# Patient Record
Sex: Female | Born: 1945
Health system: Southern US, Community
[De-identification: ages and names within clinical notes are randomized; demographics above are authoritative.]

## PROBLEM LIST (undated history)

## (undated) DIAGNOSIS — Z85528 Personal history of other malignant neoplasm of kidney: Secondary | ICD-10-CM

## (undated) DIAGNOSIS — K439 Ventral hernia without obstruction or gangrene: Secondary | ICD-10-CM

## (undated) DIAGNOSIS — I4891 Unspecified atrial fibrillation: Secondary | ICD-10-CM

## (undated) DIAGNOSIS — K7689 Other specified diseases of liver: Secondary | ICD-10-CM

## (undated) DIAGNOSIS — M19071 Primary osteoarthritis, right ankle and foot: Secondary | ICD-10-CM

## (undated) DIAGNOSIS — E669 Obesity, unspecified: Secondary | ICD-10-CM

## (undated) DIAGNOSIS — D649 Anemia, unspecified: Secondary | ICD-10-CM

## (undated) DIAGNOSIS — M1712 Unilateral primary osteoarthritis, left knee: Secondary | ICD-10-CM

## (undated) DIAGNOSIS — M1990 Unspecified osteoarthritis, unspecified site: Secondary | ICD-10-CM

## (undated) DIAGNOSIS — B259 Cytomegaloviral disease, unspecified: Secondary | ICD-10-CM

## (undated) DIAGNOSIS — I471 Supraventricular tachycardia, unspecified: Secondary | ICD-10-CM

## (undated) DIAGNOSIS — D631 Anemia in chronic kidney disease: Secondary | ICD-10-CM

## (undated) DIAGNOSIS — K449 Diaphragmatic hernia without obstruction or gangrene: Secondary | ICD-10-CM

## (undated) DIAGNOSIS — H269 Unspecified cataract: Secondary | ICD-10-CM

## (undated) DIAGNOSIS — E538 Deficiency of other specified B group vitamins: Secondary | ICD-10-CM

## (undated) DIAGNOSIS — N390 Urinary tract infection, site not specified: Secondary | ICD-10-CM

## (undated) DIAGNOSIS — Z905 Acquired absence of kidney: Secondary | ICD-10-CM

## (undated) DIAGNOSIS — K4090 Unilateral inguinal hernia, without obstruction or gangrene, not specified as recurrent: Secondary | ICD-10-CM

## (undated) DIAGNOSIS — M84479A Pathological fracture, unspecified toe(s), initial encounter for fracture: Secondary | ICD-10-CM

## (undated) DIAGNOSIS — F32A Depression, unspecified: Secondary | ICD-10-CM

## (undated) DIAGNOSIS — R011 Cardiac murmur, unspecified: Secondary | ICD-10-CM

## (undated) DIAGNOSIS — N186 End stage renal disease: Secondary | ICD-10-CM

## (undated) DIAGNOSIS — I480 Paroxysmal atrial fibrillation: Secondary | ICD-10-CM

## (undated) DIAGNOSIS — M7542 Impingement syndrome of left shoulder: Secondary | ICD-10-CM

## (undated) DIAGNOSIS — C649 Malignant neoplasm of unspecified kidney, except renal pelvis: Secondary | ICD-10-CM

## (undated) DIAGNOSIS — N051 Unspecified nephritic syndrome with focal and segmental glomerular lesions: Secondary | ICD-10-CM

## (undated) DIAGNOSIS — J189 Pneumonia, unspecified organism: Secondary | ICD-10-CM

## (undated) DIAGNOSIS — K5790 Diverticulosis of intestine, part unspecified, without perforation or abscess without bleeding: Secondary | ICD-10-CM

## (undated) DIAGNOSIS — M810 Age-related osteoporosis without current pathological fracture: Secondary | ICD-10-CM

## (undated) DIAGNOSIS — N189 Chronic kidney disease, unspecified: Secondary | ICD-10-CM

## (undated) DIAGNOSIS — F419 Anxiety disorder, unspecified: Secondary | ICD-10-CM

## (undated) DIAGNOSIS — I10 Essential (primary) hypertension: Secondary | ICD-10-CM

## (undated) DIAGNOSIS — M48061 Spinal stenosis, lumbar region without neurogenic claudication: Secondary | ICD-10-CM

## (undated) DIAGNOSIS — E785 Hyperlipidemia, unspecified: Secondary | ICD-10-CM

## (undated) DIAGNOSIS — Z940 Kidney transplant status: Secondary | ICD-10-CM

## (undated) DIAGNOSIS — N183 Chronic kidney disease, stage 3 (moderate): Secondary | ICD-10-CM

## (undated) DIAGNOSIS — L438 Other lichen planus: Secondary | ICD-10-CM

## (undated) DIAGNOSIS — K219 Gastro-esophageal reflux disease without esophagitis: Secondary | ICD-10-CM

## (undated) DIAGNOSIS — E039 Hypothyroidism, unspecified: Secondary | ICD-10-CM

## (undated) DIAGNOSIS — M109 Gout, unspecified: Secondary | ICD-10-CM

## (undated) HISTORY — DX: Diaphragmatic hernia without obstruction or gangrene: K44.9

## (undated) HISTORY — PX: COLONOSCOPY: SHX174

## (undated) HISTORY — DX: Supraventricular tachycardia: I47.1

## (undated) HISTORY — DX: Paroxysmal atrial fibrillation: I48.0

## (undated) HISTORY — DX: Other specified diseases of liver: K76.89

## (undated) HISTORY — DX: Pneumonia, unspecified organism: J18.9

## (undated) HISTORY — DX: Hyperlipidemia, unspecified: E78.5

## (undated) HISTORY — DX: Cytomegaloviral disease, unspecified: B25.9

## (undated) HISTORY — PX: CARDIOVASCULAR STRESS TEST: SHX262

## (undated) HISTORY — DX: Impingement syndrome of left shoulder: M75.42

## (undated) HISTORY — DX: Chronic kidney disease, stage 3 (moderate): N18.3

## (undated) HISTORY — DX: Pathological fracture, unspecified toe(s), initial encounter for fracture: M84.479A

## (undated) HISTORY — DX: Obesity, unspecified: E66.9

## (undated) HISTORY — DX: Deficiency of other specified B group vitamins: E53.8

## (undated) HISTORY — PX: TRANSTHORACIC ECHOCARDIOGRAM: SHX275

## (undated) HISTORY — DX: Malignant neoplasm of unspecified kidney, except renal pelvis: C64.9

## (undated) HISTORY — DX: Unspecified osteoarthritis, unspecified site: M19.90

## (undated) HISTORY — DX: Primary osteoarthritis, right ankle and foot: M19.071

## (undated) HISTORY — DX: Unilateral primary osteoarthritis, left knee: M17.12

## (undated) HISTORY — DX: Anemia in chronic kidney disease: D63.1

## (undated) HISTORY — DX: Urinary tract infection, site not specified: N39.0

## (undated) HISTORY — DX: Anemia, unspecified: D64.9

## (undated) HISTORY — DX: Acquired absence of kidney: Z90.5

## (undated) HISTORY — PX: OTHER SURGICAL HISTORY: SHX169

## (undated) HISTORY — DX: Cardiac murmur, unspecified: R01.1

## (undated) HISTORY — DX: Anxiety disorder, unspecified: F41.9

## (undated) HISTORY — DX: Ventral hernia without obstruction or gangrene: K43.9

## (undated) HISTORY — DX: Depression, unspecified: F32.A

## (undated) HISTORY — DX: Gout, unspecified: M10.9

## (undated) HISTORY — DX: Unspecified cataract: H26.9

## (undated) HISTORY — DX: End stage renal disease: N18.6

## (undated) HISTORY — DX: Unilateral inguinal hernia, without obstruction or gangrene, not specified as recurrent: K40.90

## (undated) HISTORY — DX: Unspecified atrial fibrillation: I48.91

## (undated) HISTORY — DX: Other lichen planus: L43.8

## (undated) HISTORY — DX: Age-related osteoporosis without current pathological fracture: M81.0

## (undated) HISTORY — DX: Supraventricular tachycardia, unspecified: I47.10

## (undated) HISTORY — PX: RENAL BIOPSY: SHX156

## (undated) HISTORY — PX: CATARACT EXTRACTION: SUR2

## (undated) HISTORY — PX: AV FISTULA PLACEMENT: SHX1204

## (undated) HISTORY — DX: Kidney transplant status: Z94.0

## (undated) HISTORY — DX: Personal history of other malignant neoplasm of kidney: Z85.528

## (undated) HISTORY — DX: Gastro-esophageal reflux disease without esophagitis: K21.9

## (undated) HISTORY — DX: Chronic kidney disease, unspecified: N18.9

## (undated) HISTORY — DX: Spinal stenosis, lumbar region without neurogenic claudication: M48.061

## (undated) HISTORY — DX: Hypothyroidism, unspecified: E03.9

## (undated) HISTORY — PX: HYSTERECTOMY ABDOMINAL WITH SALPINGECTOMY: SHX6725

---

## 1991-08-09 DIAGNOSIS — Z85528 Personal history of other malignant neoplasm of kidney: Secondary | ICD-10-CM

## 1991-08-09 HISTORY — DX: Personal history of other malignant neoplasm of kidney: Z85.528

## 1991-08-09 HISTORY — PX: NEPHRECTOMY: SHX65

## 1995-08-09 HISTORY — PX: TOTAL ABDOMINAL HYSTERECTOMY W/ BILATERAL SALPINGOOPHORECTOMY: SHX83

## 1998-04-30 ENCOUNTER — Ambulatory Visit (HOSPITAL_COMMUNITY): Admission: RE | Admit: 1998-04-30 | Discharge: 1998-04-30 | Payer: Self-pay | Admitting: Internal Medicine

## 1998-06-17 ENCOUNTER — Encounter: Payer: Self-pay | Admitting: *Deleted

## 1998-06-17 ENCOUNTER — Ambulatory Visit (HOSPITAL_COMMUNITY): Admission: RE | Admit: 1998-06-17 | Discharge: 1998-06-17 | Payer: Self-pay | Admitting: *Deleted

## 1999-10-13 ENCOUNTER — Inpatient Hospital Stay (HOSPITAL_COMMUNITY): Admission: RE | Admit: 1999-10-13 | Discharge: 1999-10-16 | Payer: Self-pay | Admitting: Urology

## 1999-10-13 ENCOUNTER — Encounter (INDEPENDENT_AMBULATORY_CARE_PROVIDER_SITE_OTHER): Payer: Self-pay | Admitting: Specialist

## 1999-11-05 ENCOUNTER — Other Ambulatory Visit: Admission: RE | Admit: 1999-11-05 | Discharge: 1999-11-05 | Payer: Self-pay | Admitting: *Deleted

## 2000-01-30 ENCOUNTER — Encounter: Payer: Self-pay | Admitting: Emergency Medicine

## 2000-01-30 ENCOUNTER — Emergency Department (HOSPITAL_COMMUNITY): Admission: EM | Admit: 2000-01-30 | Discharge: 2000-01-30 | Payer: Self-pay | Admitting: Emergency Medicine

## 2000-05-01 ENCOUNTER — Encounter: Payer: Self-pay | Admitting: *Deleted

## 2000-05-01 ENCOUNTER — Ambulatory Visit (HOSPITAL_COMMUNITY): Admission: RE | Admit: 2000-05-01 | Discharge: 2000-05-01 | Payer: Self-pay | Admitting: *Deleted

## 2000-05-04 ENCOUNTER — Encounter: Admission: RE | Admit: 2000-05-04 | Discharge: 2000-05-04 | Payer: Self-pay | Admitting: *Deleted

## 2000-05-04 ENCOUNTER — Encounter: Payer: Self-pay | Admitting: *Deleted

## 2000-11-23 ENCOUNTER — Encounter: Payer: Self-pay | Admitting: Urology

## 2000-11-23 ENCOUNTER — Encounter: Admission: RE | Admit: 2000-11-23 | Discharge: 2000-11-23 | Payer: Self-pay | Admitting: Urology

## 2001-05-09 ENCOUNTER — Encounter: Payer: Self-pay | Admitting: *Deleted

## 2001-05-09 ENCOUNTER — Ambulatory Visit (HOSPITAL_COMMUNITY): Admission: RE | Admit: 2001-05-09 | Discharge: 2001-05-09 | Payer: Self-pay | Admitting: *Deleted

## 2001-05-15 ENCOUNTER — Encounter: Admission: RE | Admit: 2001-05-15 | Discharge: 2001-05-15 | Payer: Self-pay | Admitting: *Deleted

## 2001-05-15 ENCOUNTER — Encounter: Payer: Self-pay | Admitting: *Deleted

## 2001-06-19 ENCOUNTER — Other Ambulatory Visit: Admission: RE | Admit: 2001-06-19 | Discharge: 2001-06-19 | Payer: Self-pay | Admitting: *Deleted

## 2001-11-15 ENCOUNTER — Encounter: Admission: RE | Admit: 2001-11-15 | Discharge: 2001-11-15 | Payer: Self-pay | Admitting: Urology

## 2001-11-15 ENCOUNTER — Encounter: Payer: Self-pay | Admitting: Urology

## 2002-05-16 ENCOUNTER — Encounter: Admission: RE | Admit: 2002-05-16 | Discharge: 2002-05-16 | Payer: Self-pay | Admitting: *Deleted

## 2002-05-16 ENCOUNTER — Encounter: Payer: Self-pay | Admitting: *Deleted

## 2002-11-21 ENCOUNTER — Ambulatory Visit (HOSPITAL_COMMUNITY): Admission: RE | Admit: 2002-11-21 | Discharge: 2002-11-21 | Payer: Self-pay | Admitting: Urology

## 2002-11-21 ENCOUNTER — Encounter: Payer: Self-pay | Admitting: Urology

## 2003-05-29 ENCOUNTER — Encounter: Payer: Self-pay | Admitting: *Deleted

## 2003-05-29 ENCOUNTER — Encounter: Admission: RE | Admit: 2003-05-29 | Discharge: 2003-05-29 | Payer: Self-pay | Admitting: *Deleted

## 2003-12-09 ENCOUNTER — Ambulatory Visit (HOSPITAL_COMMUNITY): Admission: RE | Admit: 2003-12-09 | Discharge: 2003-12-09 | Payer: Self-pay | Admitting: Urology

## 2003-12-09 IMAGING — CR DG CHEST 2V
2 series · 2 of 2 positions shown · non-contrast
Comparison: [DATE].

CLINICAL DATA: History of left renal cancer.  
 TWO VIEW CHEST

[view not recorded (1 of 2)]
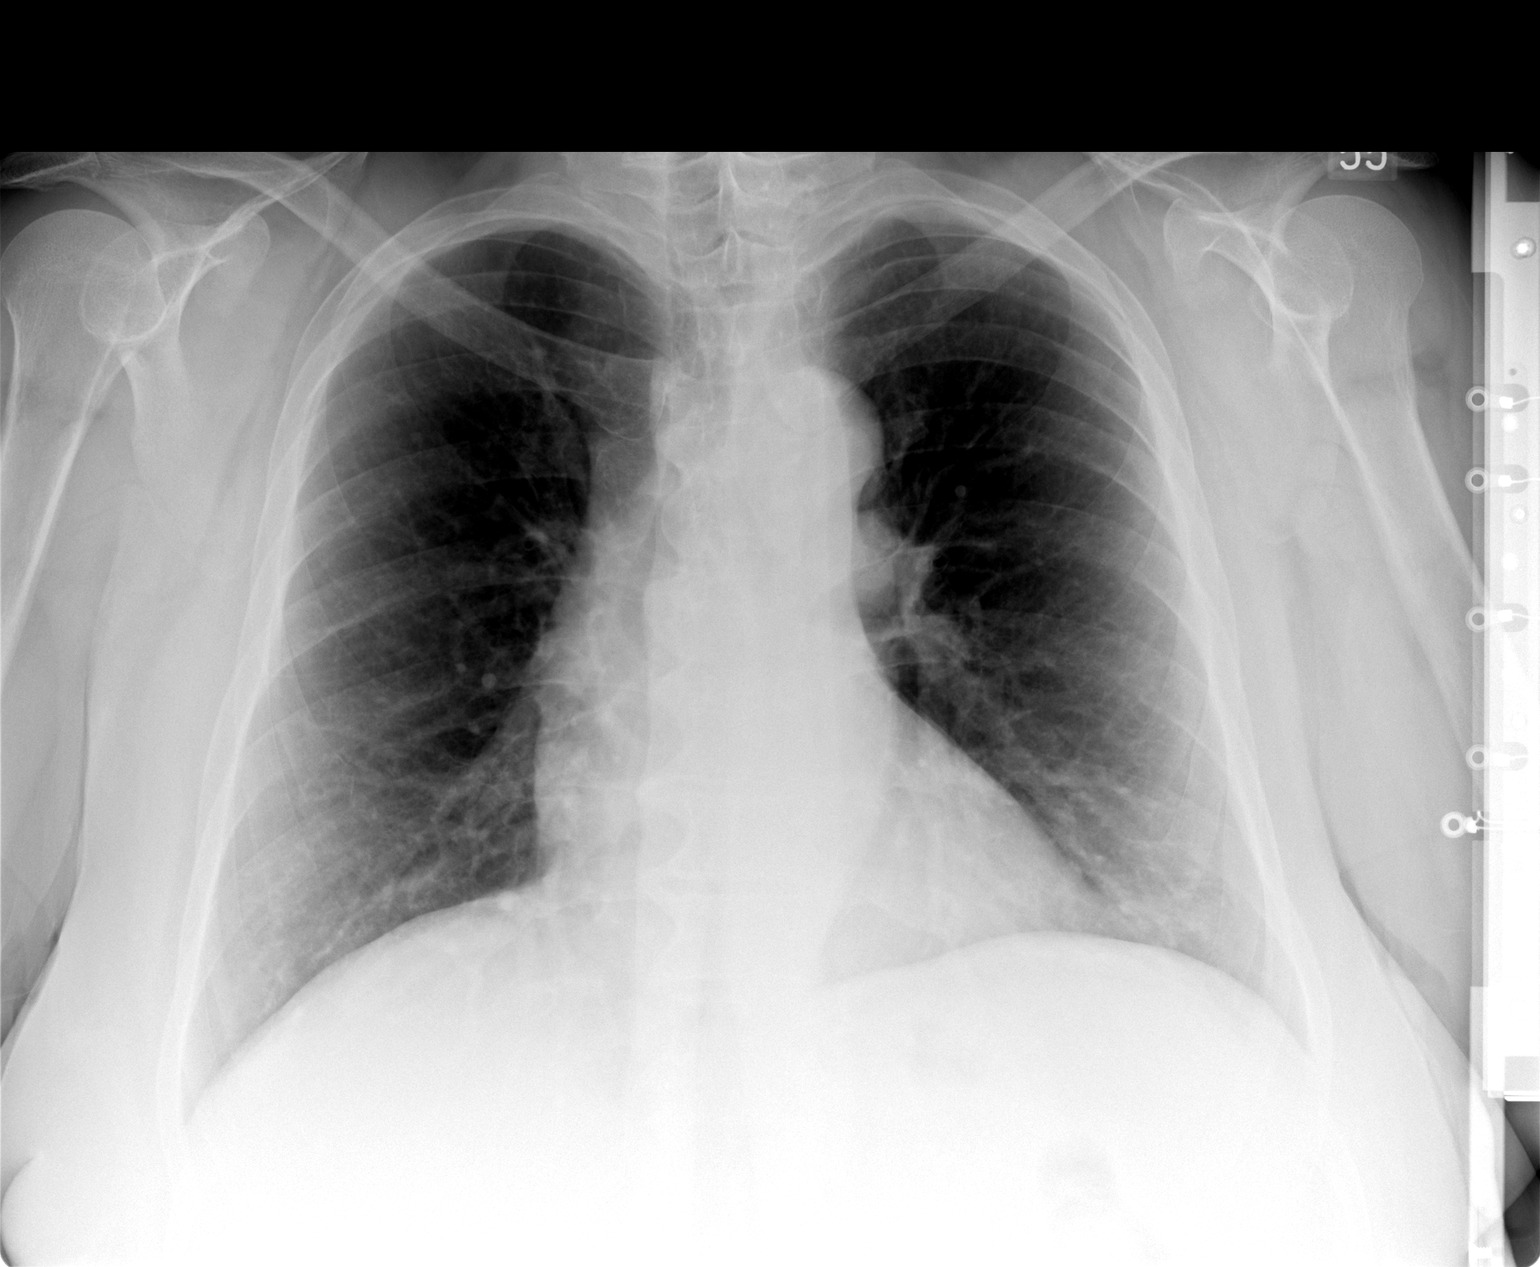

[view not recorded (2 of 2)]
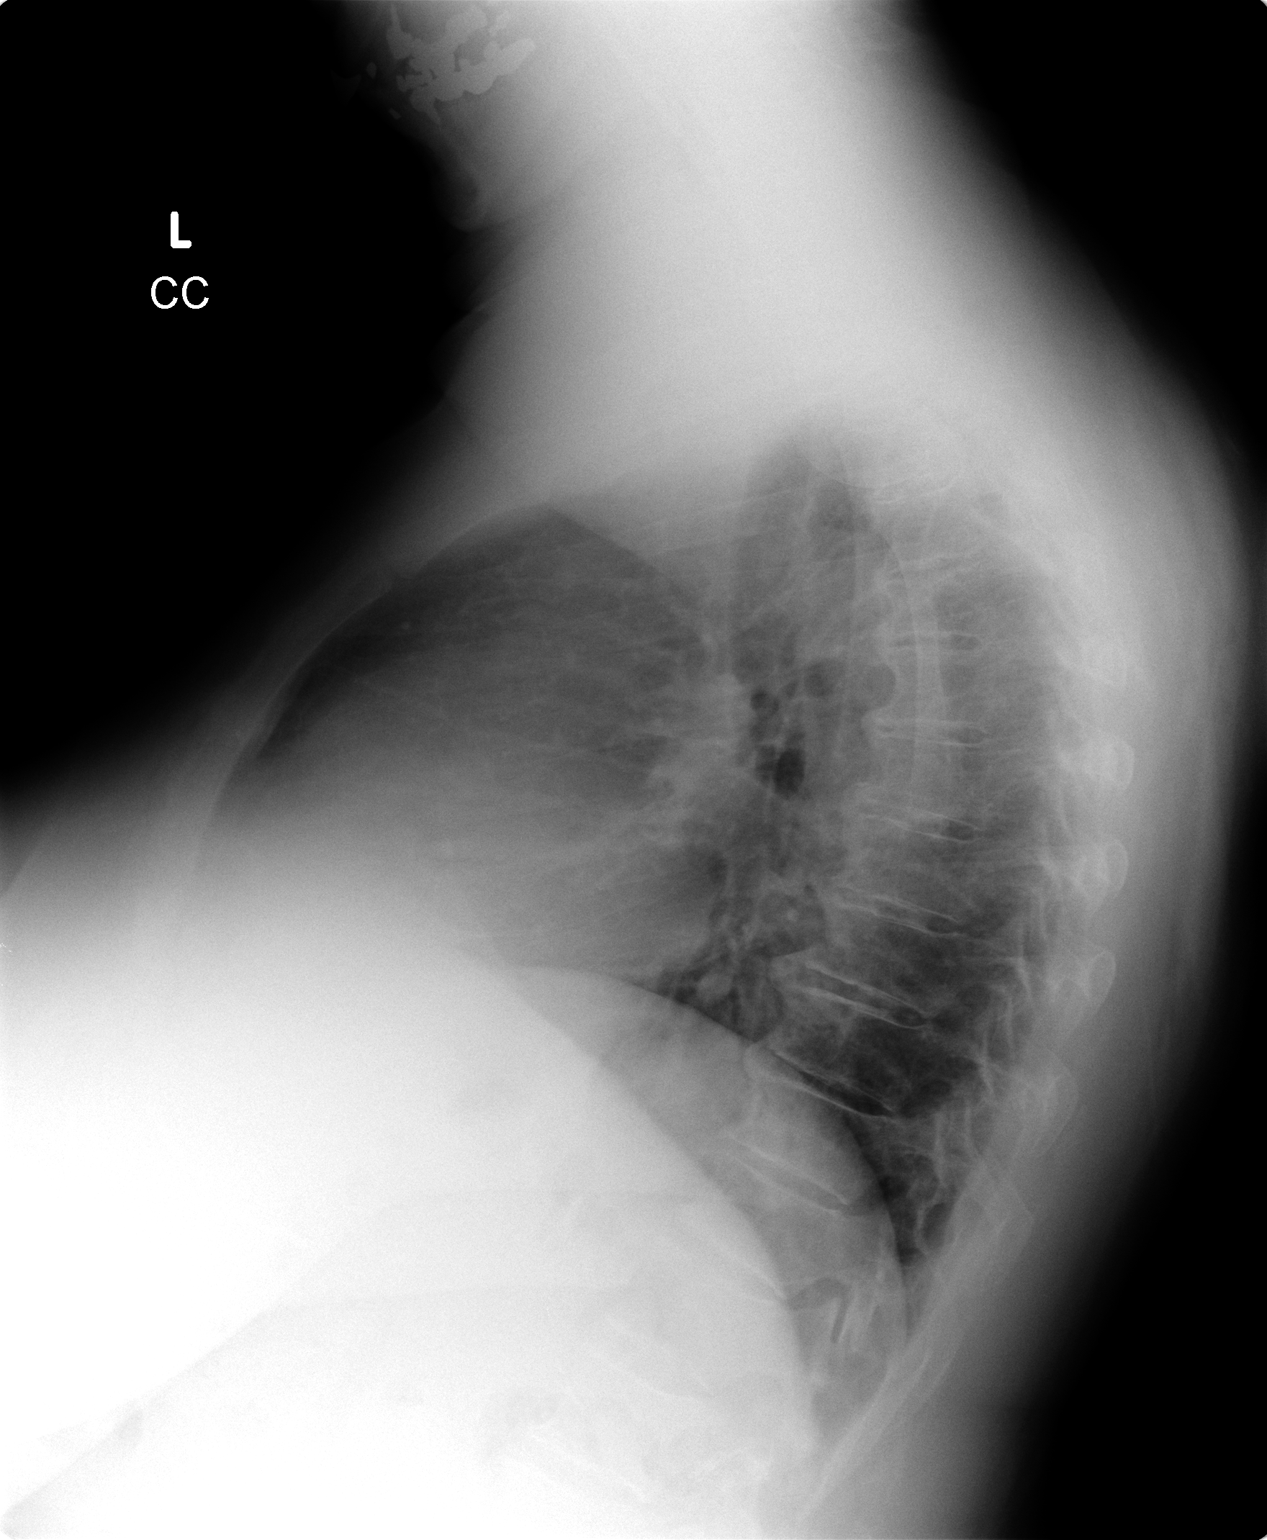

[2 of 2 positions shown; findings below may reference images not displayed]

Stable normal sized heart and mild diffuse peribronchial thickening.  Thoracic spine degenerative changes.
 IMPRESSION
 Stable mild chronic bronchitic changes.  No evidence of metastatic disease.

## 2003-12-19 ENCOUNTER — Encounter: Payer: Self-pay | Admitting: Internal Medicine

## 2004-06-23 ENCOUNTER — Encounter: Admission: RE | Admit: 2004-06-23 | Discharge: 2004-06-23 | Payer: Self-pay | Admitting: *Deleted

## 2004-08-12 ENCOUNTER — Ambulatory Visit: Payer: Self-pay | Admitting: Internal Medicine

## 2004-08-19 ENCOUNTER — Ambulatory Visit: Payer: Self-pay | Admitting: Internal Medicine

## 2004-09-10 ENCOUNTER — Ambulatory Visit: Payer: Self-pay | Admitting: Family Medicine

## 2005-03-25 ENCOUNTER — Ambulatory Visit: Payer: Self-pay | Admitting: Internal Medicine

## 2005-04-19 ENCOUNTER — Ambulatory Visit: Payer: Self-pay | Admitting: Internal Medicine

## 2005-08-15 ENCOUNTER — Ambulatory Visit: Payer: Self-pay | Admitting: Internal Medicine

## 2005-09-02 ENCOUNTER — Encounter: Admission: RE | Admit: 2005-09-02 | Discharge: 2005-09-02 | Payer: Self-pay | Admitting: Internal Medicine

## 2005-09-02 IMAGING — MG MM MAMMO SCREENING
9 series · 9 of 9 positions shown · non-contrast
Comparison: none

SCREENING MAMMOGRAM:
There is a fibroglandular pattern.  No masses or malignant type calcifications are identified.  
Compared with prior studies.

[R CC (1 of 2)]
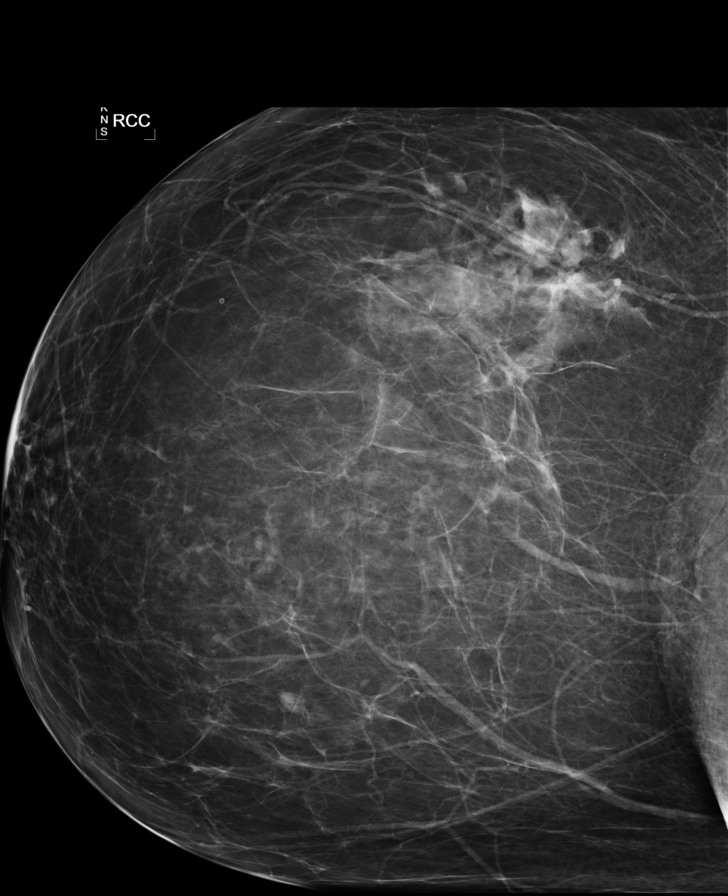

[R MLO (1 of 2)]
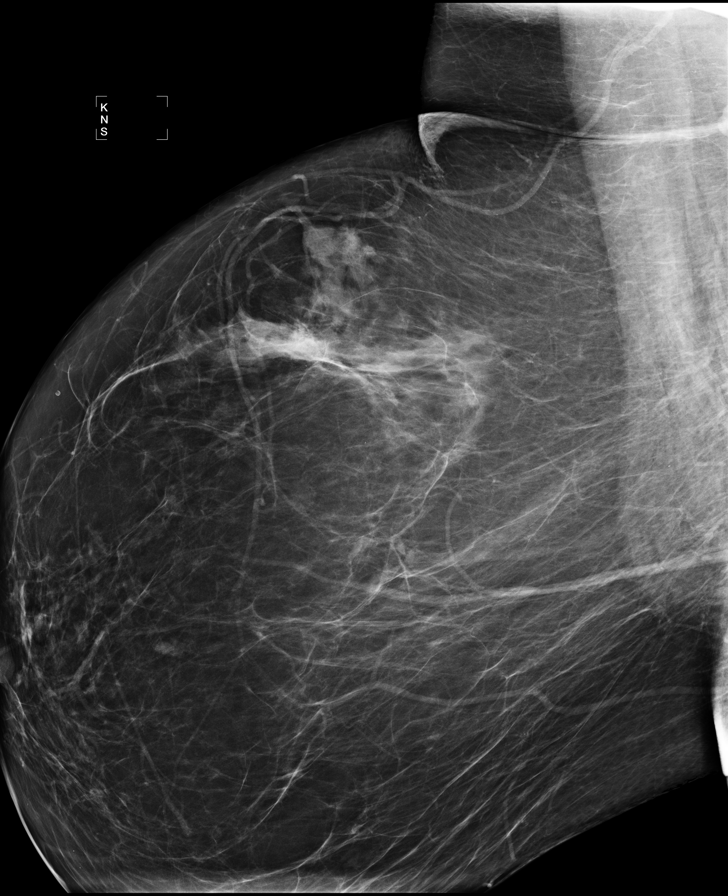

[L CC (1 of 2)]
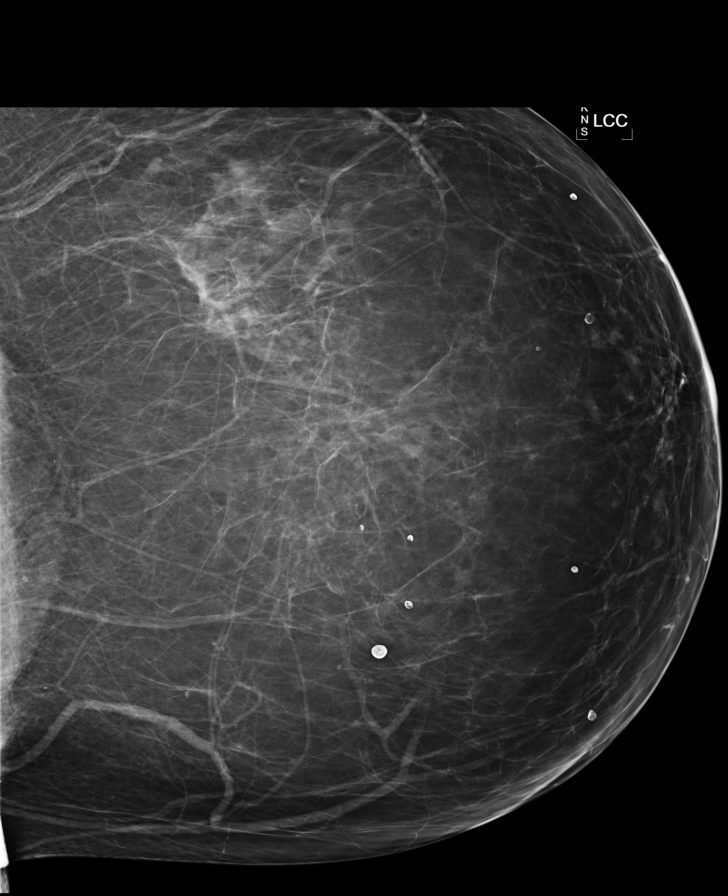

[L MLO (1 of 2)]
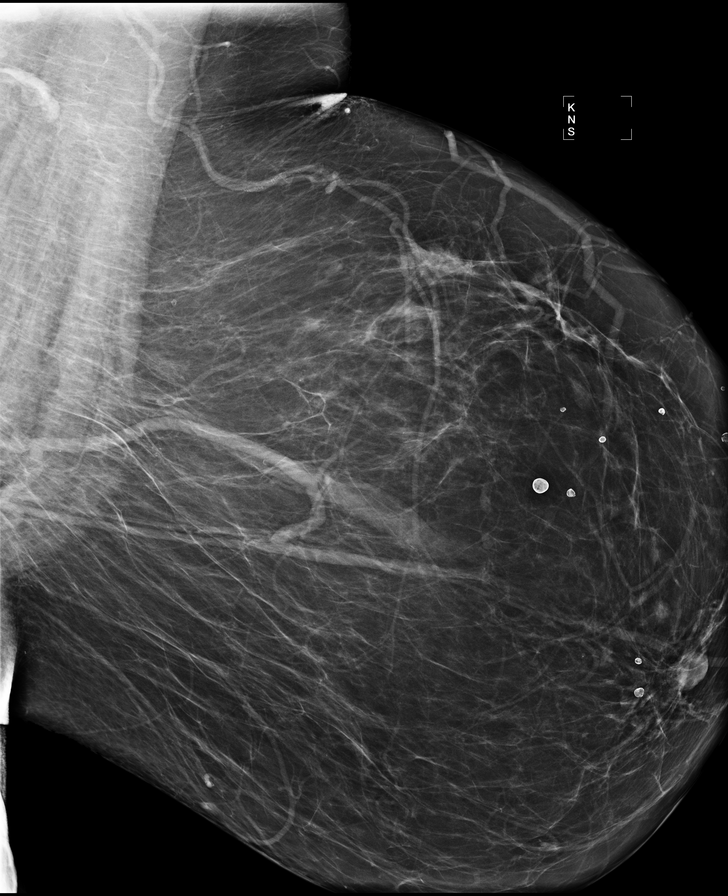

[L CC (2 of 2)]
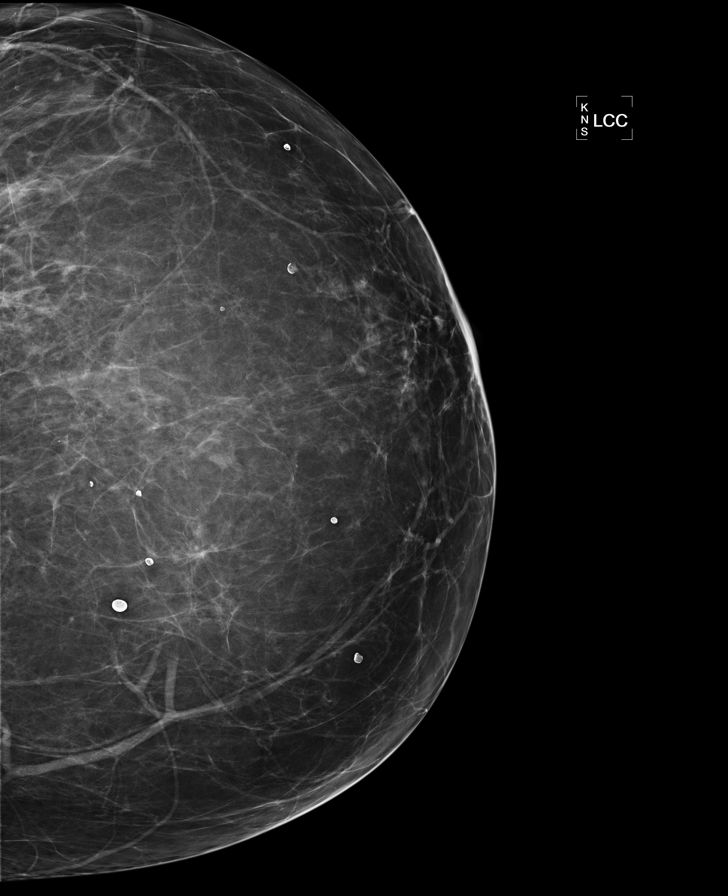

[R CC (2 of 2)]
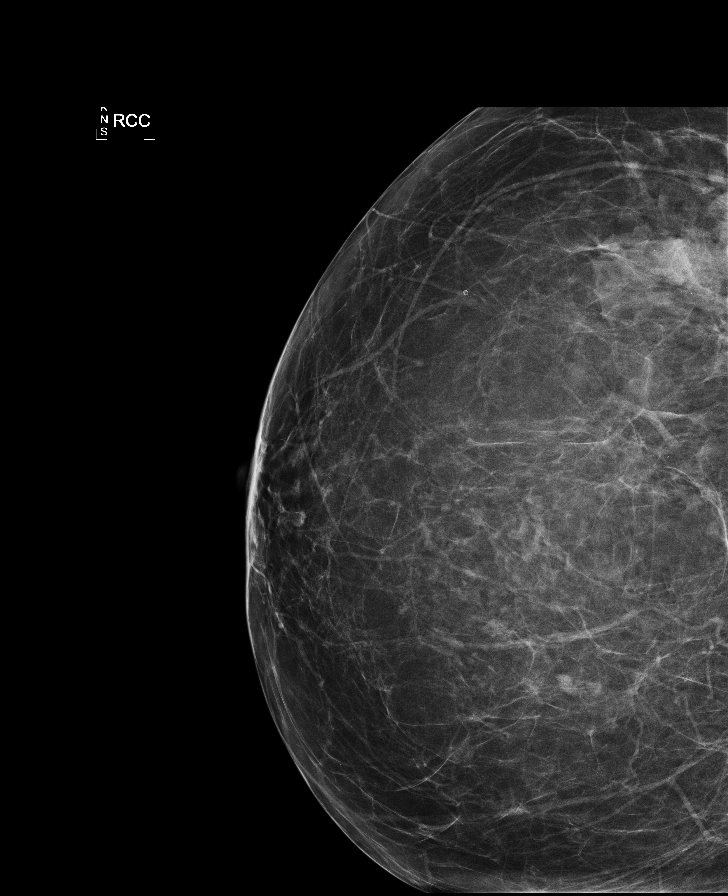

[R CV]
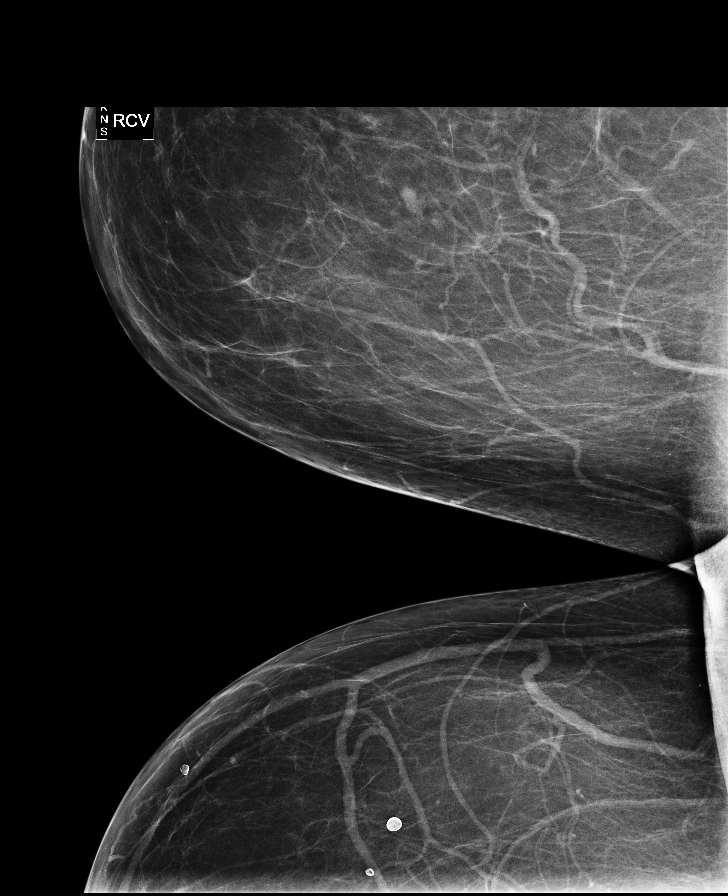

[R MLO (2 of 2)]
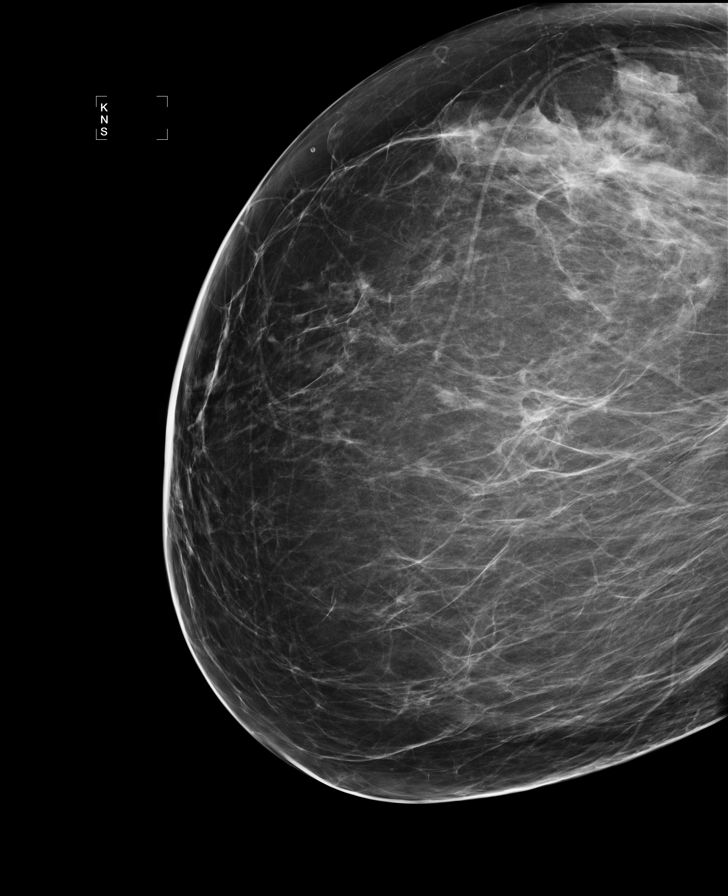

[L MLO (2 of 2)]
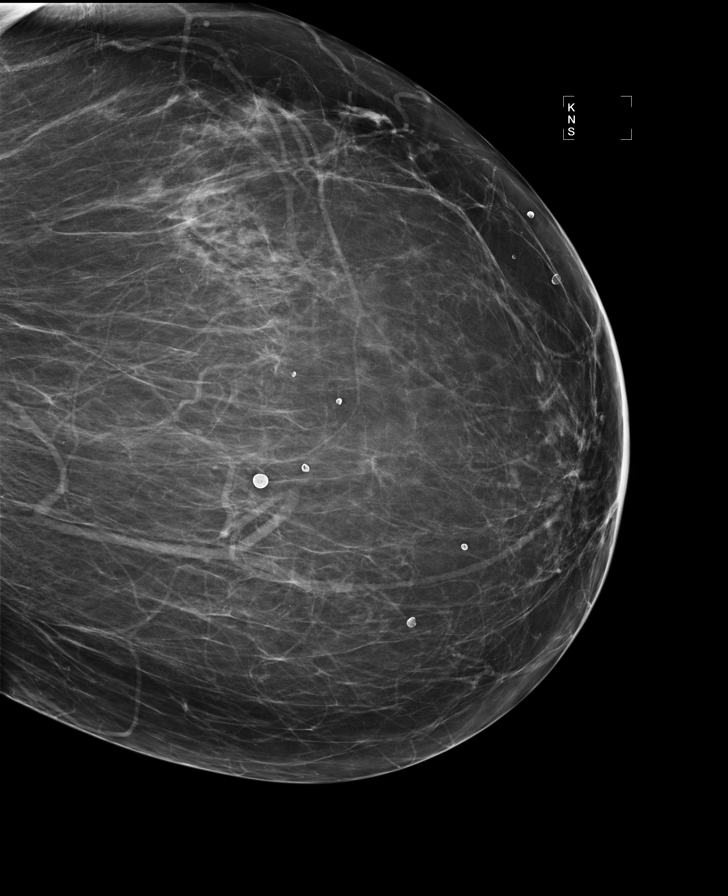

[9 of 9 positions shown; findings below may reference images not displayed]

IMPRESSION: No specific mammographic evidence of malignancy.  Next screening mammogram is recommended in one 
year.

ASSESSMENT: Negative - BI-RADS 1

Screening mammogram in 1 year.

## 2005-09-07 ENCOUNTER — Ambulatory Visit: Payer: Self-pay | Admitting: Internal Medicine

## 2005-09-19 ENCOUNTER — Ambulatory Visit: Payer: Self-pay | Admitting: Internal Medicine

## 2005-10-04 ENCOUNTER — Ambulatory Visit: Payer: Self-pay | Admitting: Internal Medicine

## 2005-12-12 ENCOUNTER — Ambulatory Visit: Payer: Self-pay | Admitting: Internal Medicine

## 2005-12-22 ENCOUNTER — Ambulatory Visit (HOSPITAL_COMMUNITY): Admission: RE | Admit: 2005-12-22 | Discharge: 2005-12-22 | Payer: Self-pay | Admitting: Urology

## 2005-12-28 IMAGING — CR DG CHEST 2V
2 series · 2 of 2 positions shown · non-contrast
Comparison: [DATE].

CLINICAL DATA: History of kidney cancer.  
 CHEST - 2 VIEW:

[view not recorded (1 of 2)]
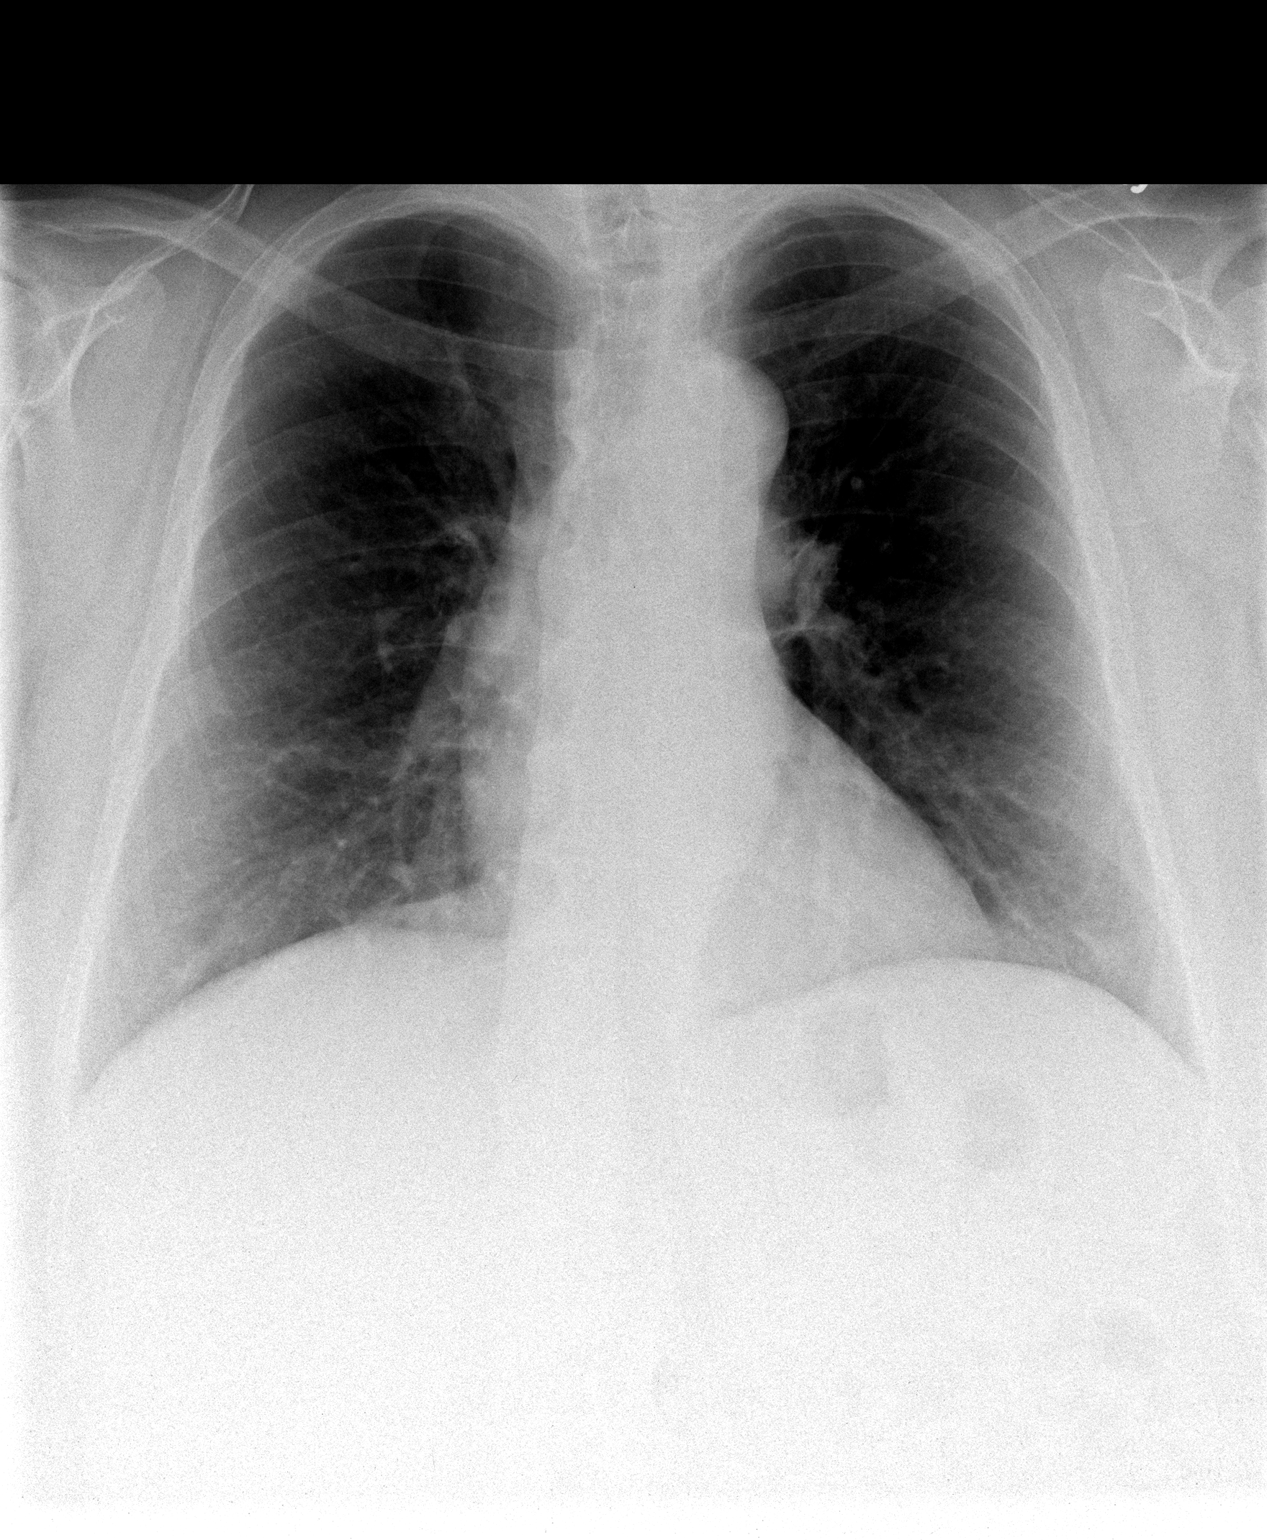

[view not recorded (2 of 2)]
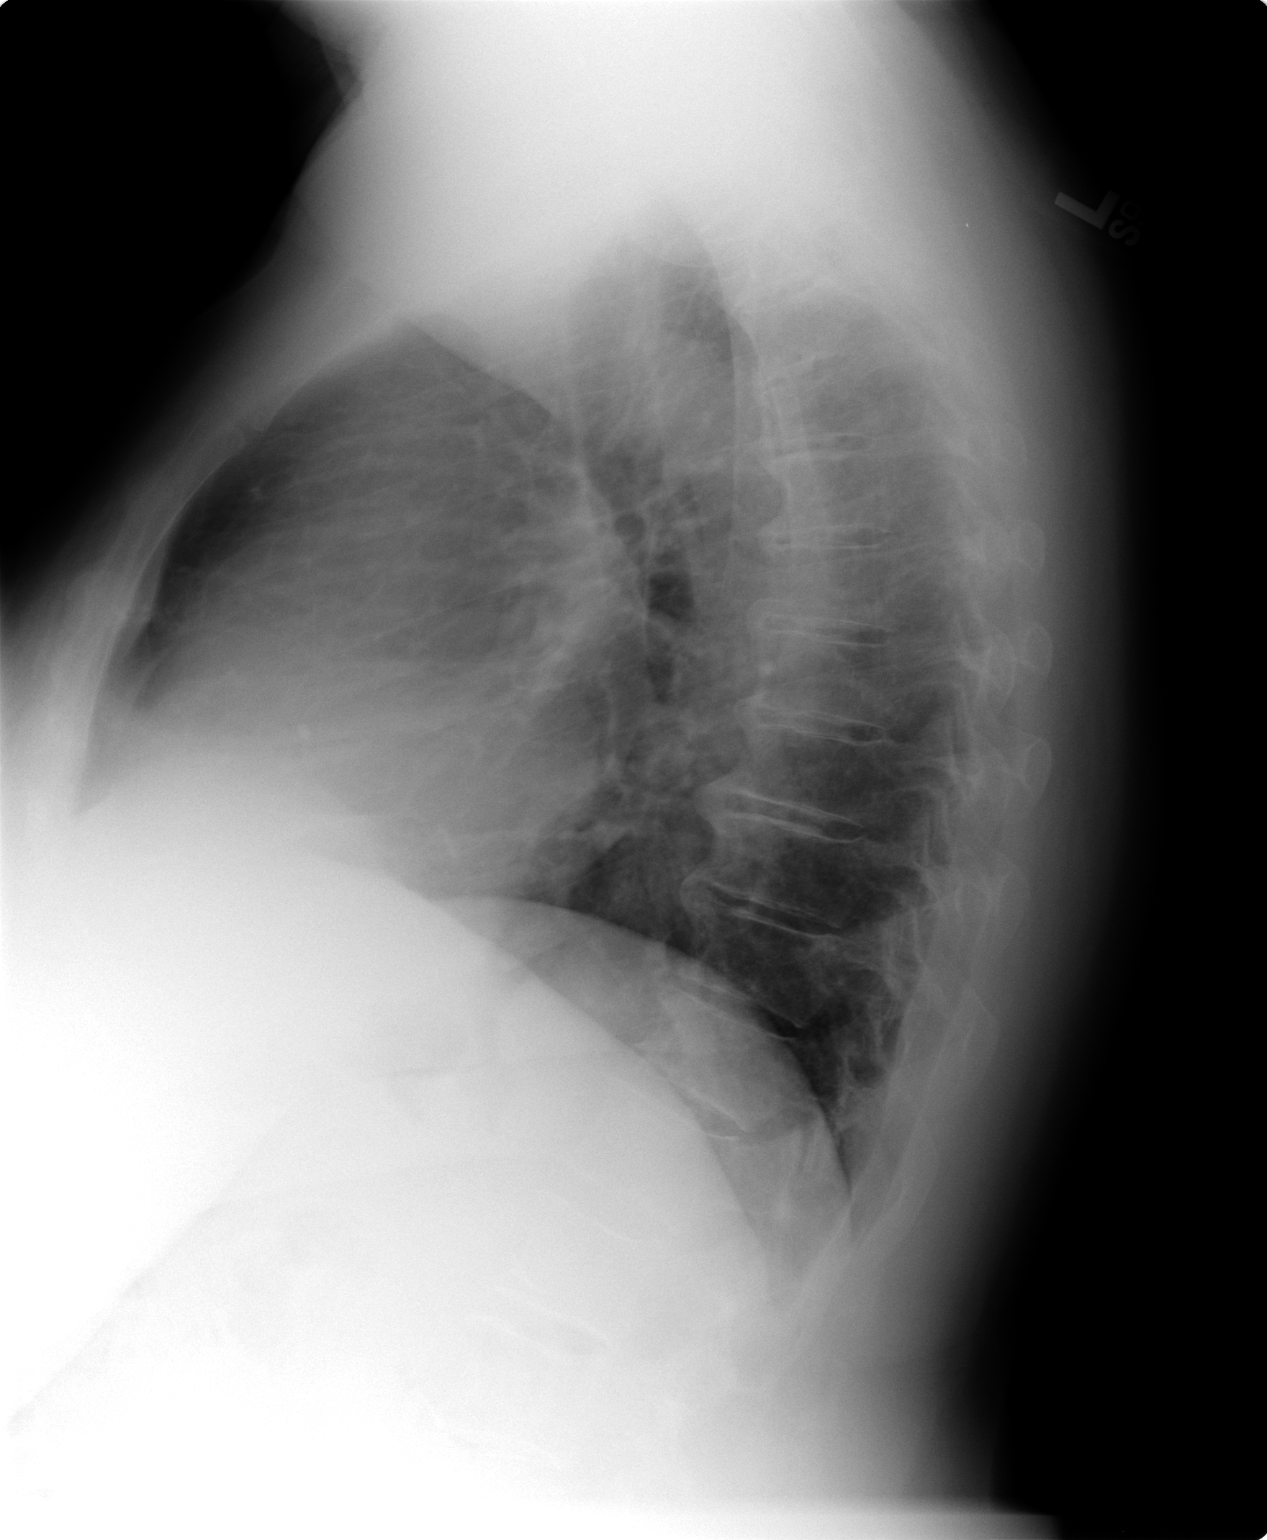

[2 of 2 positions shown; findings below may reference images not displayed]

The cardiac silhouette, mediastinum, and pulmonary vasculature are within normal limits.  Both lungs are clear.  There are hypertrophic degenerative changes within the axial spine, unchanged.
IMPRESSION: Stable chest x-ray with no evidence of acute cardiac or pulmonary process.

## 2006-12-26 DIAGNOSIS — M1990 Unspecified osteoarthritis, unspecified site: Secondary | ICD-10-CM | POA: Insufficient documentation

## 2007-01-16 ENCOUNTER — Encounter: Admission: RE | Admit: 2007-01-16 | Discharge: 2007-01-16 | Payer: Self-pay | Admitting: Internal Medicine

## 2007-02-23 ENCOUNTER — Encounter: Payer: Self-pay | Admitting: Internal Medicine

## 2007-03-26 ENCOUNTER — Ambulatory Visit: Payer: Self-pay | Admitting: Internal Medicine

## 2007-03-26 DIAGNOSIS — Z85528 Personal history of other malignant neoplasm of kidney: Secondary | ICD-10-CM | POA: Insufficient documentation

## 2007-03-26 DIAGNOSIS — M109 Gout, unspecified: Secondary | ICD-10-CM | POA: Insufficient documentation

## 2007-03-26 DIAGNOSIS — E8881 Metabolic syndrome: Secondary | ICD-10-CM | POA: Insufficient documentation

## 2007-04-02 ENCOUNTER — Encounter (INDEPENDENT_AMBULATORY_CARE_PROVIDER_SITE_OTHER): Payer: Self-pay | Admitting: *Deleted

## 2007-09-27 ENCOUNTER — Encounter: Payer: Self-pay | Admitting: Internal Medicine

## 2007-10-02 ENCOUNTER — Ambulatory Visit: Payer: Self-pay | Admitting: Internal Medicine

## 2007-10-02 DIAGNOSIS — H8110 Benign paroxysmal vertigo, unspecified ear: Secondary | ICD-10-CM | POA: Insufficient documentation

## 2007-10-02 DIAGNOSIS — E785 Hyperlipidemia, unspecified: Secondary | ICD-10-CM | POA: Insufficient documentation

## 2007-10-02 DIAGNOSIS — E039 Hypothyroidism, unspecified: Secondary | ICD-10-CM | POA: Insufficient documentation

## 2007-10-02 DIAGNOSIS — I10 Essential (primary) hypertension: Secondary | ICD-10-CM | POA: Insufficient documentation

## 2007-10-09 ENCOUNTER — Telehealth: Payer: Self-pay | Admitting: Internal Medicine

## 2007-10-09 ENCOUNTER — Encounter (INDEPENDENT_AMBULATORY_CARE_PROVIDER_SITE_OTHER): Payer: Self-pay | Admitting: *Deleted

## 2007-10-09 LAB — CONVERTED CEMR LAB
ALT: 20 units/L (ref 0–35)
AST: 24 units/L (ref 0–37)
Cholesterol: 236 mg/dL (ref 0–200)
Direct LDL: 166.3 mg/dL
HDL: 44.2 mg/dL (ref >39.0)
Hgb A1c MFr Bld: 5.5 % (ref 4.6–6.0)
TSH: 8.65 microintl units/mL — ABNORMAL HIGH (ref 0.35–5.50)
Total CHOL/HDL Ratio: 5.3
Triglycerides: 127 mg/dL (ref 0–149)
VLDL: 25 mg/dL (ref 0–40)

## 2007-10-16 ENCOUNTER — Telehealth (INDEPENDENT_AMBULATORY_CARE_PROVIDER_SITE_OTHER): Payer: Self-pay | Admitting: *Deleted

## 2008-03-05 ENCOUNTER — Telehealth (INDEPENDENT_AMBULATORY_CARE_PROVIDER_SITE_OTHER): Payer: Self-pay | Admitting: *Deleted

## 2008-03-31 ENCOUNTER — Encounter: Payer: Self-pay | Admitting: Internal Medicine

## 2008-06-09 ENCOUNTER — Telehealth (INDEPENDENT_AMBULATORY_CARE_PROVIDER_SITE_OTHER): Payer: Self-pay | Admitting: *Deleted

## 2008-06-11 ENCOUNTER — Ambulatory Visit: Payer: Self-pay | Admitting: Family Medicine

## 2008-06-16 ENCOUNTER — Encounter: Admission: RE | Admit: 2008-06-16 | Discharge: 2008-06-16 | Payer: Self-pay | Admitting: Internal Medicine

## 2008-08-26 ENCOUNTER — Ambulatory Visit: Payer: Self-pay | Admitting: Occupational Medicine

## 2008-08-26 ENCOUNTER — Telehealth (INDEPENDENT_AMBULATORY_CARE_PROVIDER_SITE_OTHER): Payer: Self-pay | Admitting: *Deleted

## 2008-09-26 ENCOUNTER — Ambulatory Visit: Payer: Self-pay | Admitting: Internal Medicine

## 2008-09-26 DIAGNOSIS — Z860100 Personal history of colon polyps, unspecified: Secondary | ICD-10-CM | POA: Insufficient documentation

## 2008-09-26 DIAGNOSIS — M949 Disorder of cartilage, unspecified: Secondary | ICD-10-CM

## 2008-09-26 DIAGNOSIS — M899 Disorder of bone, unspecified: Secondary | ICD-10-CM | POA: Insufficient documentation

## 2008-09-26 DIAGNOSIS — K5730 Diverticulosis of large intestine without perforation or abscess without bleeding: Secondary | ICD-10-CM | POA: Insufficient documentation

## 2008-10-02 ENCOUNTER — Ambulatory Visit: Payer: Self-pay | Admitting: Internal Medicine

## 2008-10-13 LAB — CONVERTED CEMR LAB
ALT: 16 units/L (ref 0–35)
AST: 23 units/L (ref 0–37)
Albumin: 3.5 g/dL (ref 3.5–5.2)
Alkaline Phosphatase: 116 units/L (ref 39–117)
BUN: 42 mg/dL — ABNORMAL HIGH (ref 6–23)
Basophils Absolute: 0 10*3/uL (ref 0.0–0.1)
Basophils Relative: 0 % (ref 0.0–3.0)
Bilirubin, Direct: 0.1 mg/dL (ref 0.0–0.3)
CO2: 24 meq/L (ref 19–32)
Calcium: 9.2 mg/dL (ref 8.4–10.5)
Chloride: 109 meq/L (ref 96–112)
Cholesterol: 193 mg/dL (ref 0–200)
Creatinine, Ser: 3.1 mg/dL — ABNORMAL HIGH (ref 0.4–1.2)
Eosinophils Absolute: 0.8 10*3/uL — ABNORMAL HIGH (ref 0.0–0.7)
Eosinophils Relative: 9.6 % — ABNORMAL HIGH (ref 0.0–5.0)
GFR calc Af Amer: 20 mL/min
GFR calc non Af Amer: 16 mL/min
Glucose, Bld: 91 mg/dL (ref 70–99)
HCT: 33.8 % — ABNORMAL LOW (ref 36.0–46.0)
HDL: 34.7 mg/dL — ABNORMAL LOW (ref >39.0)
Hemoglobin: 12 g/dL (ref 12.0–15.0)
Hgb A1c MFr Bld: 5.5 % (ref 4.6–6.0)
LDL Cholesterol: 127 mg/dL — ABNORMAL HIGH (ref 0–99)
Lymphocytes Relative: 28.2 % (ref 12.0–46.0)
MCHC: 35.6 g/dL (ref 30.0–36.0)
MCV: 89.5 fL (ref 78.0–100.0)
Monocytes Absolute: 0.5 10*3/uL (ref 0.1–1.0)
Monocytes Relative: 6.5 % (ref 3.0–12.0)
Neutro Abs: 4.4 10*3/uL (ref 1.4–7.7)
Neutrophils Relative %: 55.7 % (ref 43.0–77.0)
Platelets: 282 10*3/uL (ref 150–400)
Potassium: 3.8 meq/L (ref 3.5–5.1)
RBC: 3.77 M/uL — ABNORMAL LOW (ref 3.87–5.11)
RDW: 12.2 % (ref 11.5–14.6)
Sodium: 143 meq/L (ref 135–145)
TSH: 0.12 microintl units/mL — ABNORMAL LOW (ref 0.35–5.50)
Total Bilirubin: 0.8 mg/dL (ref 0.3–1.2)
Total CHOL/HDL Ratio: 5.6
Total Protein: 6.6 g/dL (ref 6.0–8.3)
Triglycerides: 157 mg/dL — ABNORMAL HIGH (ref 0–149)
Uric Acid, Serum: 7.3 mg/dL — ABNORMAL HIGH (ref 2.4–7.0)
VLDL: 31 mg/dL (ref 0–40)
Vit D, 25-Hydroxy: 8 ng/mL — ABNORMAL LOW (ref 30–89)
WBC: 8 10*3/uL (ref 4.5–10.5)

## 2008-10-16 ENCOUNTER — Encounter (INDEPENDENT_AMBULATORY_CARE_PROVIDER_SITE_OTHER): Payer: Self-pay | Admitting: *Deleted

## 2008-10-16 ENCOUNTER — Telehealth (INDEPENDENT_AMBULATORY_CARE_PROVIDER_SITE_OTHER): Payer: Self-pay | Admitting: *Deleted

## 2008-10-17 ENCOUNTER — Telehealth (INDEPENDENT_AMBULATORY_CARE_PROVIDER_SITE_OTHER): Payer: Self-pay | Admitting: *Deleted

## 2009-01-16 ENCOUNTER — Ambulatory Visit: Payer: Self-pay | Admitting: Internal Medicine

## 2009-01-18 LAB — CONVERTED CEMR LAB: TSH: 4.79 microintl units/mL (ref 0.35–5.50)

## 2009-01-19 ENCOUNTER — Encounter (INDEPENDENT_AMBULATORY_CARE_PROVIDER_SITE_OTHER): Payer: Self-pay | Admitting: *Deleted

## 2009-02-24 ENCOUNTER — Encounter: Payer: Self-pay | Admitting: Internal Medicine

## 2009-03-16 ENCOUNTER — Encounter: Payer: Self-pay | Admitting: Internal Medicine

## 2009-05-25 ENCOUNTER — Ambulatory Visit: Payer: Self-pay | Admitting: Internal Medicine

## 2009-05-26 ENCOUNTER — Encounter (INDEPENDENT_AMBULATORY_CARE_PROVIDER_SITE_OTHER): Payer: Self-pay | Admitting: *Deleted

## 2009-05-26 LAB — CONVERTED CEMR LAB: TSH: 1.19 microintl units/mL (ref 0.35–5.50)

## 2009-07-14 ENCOUNTER — Encounter: Admission: RE | Admit: 2009-07-14 | Discharge: 2009-07-14 | Payer: Self-pay | Admitting: Internal Medicine

## 2009-09-08 ENCOUNTER — Ambulatory Visit: Payer: Self-pay | Admitting: Internal Medicine

## 2009-09-08 DIAGNOSIS — R5383 Other fatigue: Secondary | ICD-10-CM

## 2009-09-08 DIAGNOSIS — N032 Chronic nephritic syndrome with diffuse membranous glomerulonephritis: Secondary | ICD-10-CM | POA: Insufficient documentation

## 2009-09-08 DIAGNOSIS — R5381 Other malaise: Secondary | ICD-10-CM | POA: Insufficient documentation

## 2009-09-08 DIAGNOSIS — E559 Vitamin D deficiency, unspecified: Secondary | ICD-10-CM | POA: Insufficient documentation

## 2009-09-10 ENCOUNTER — Ambulatory Visit: Payer: Self-pay | Admitting: Internal Medicine

## 2009-09-14 LAB — CONVERTED CEMR LAB: Vit D, 25-Hydroxy: 16 ng/mL — ABNORMAL LOW (ref 30–89)

## 2009-09-16 LAB — CONVERTED CEMR LAB
ALT: 18 units/L (ref 0–35)
AST: 21 units/L (ref 0–37)
Albumin: 3.5 g/dL (ref 3.5–5.2)
Alkaline Phosphatase: 119 units/L — ABNORMAL HIGH (ref 39–117)
BUN: 39 mg/dL — ABNORMAL HIGH (ref 6–23)
Basophils Absolute: 0.1 10*3/uL (ref 0.0–0.1)
Basophils Relative: 1.2 % (ref 0.0–3.0)
Bilirubin, Direct: 0 mg/dL (ref 0.0–0.3)
CO2: 26 meq/L (ref 19–32)
Calcium: 8.9 mg/dL (ref 8.4–10.5)
Chloride: 108 meq/L (ref 96–112)
Cholesterol: 184 mg/dL (ref 0–200)
Creatinine, Ser: 3.4 mg/dL — ABNORMAL HIGH (ref 0.4–1.2)
Eosinophils Absolute: 0.6 10*3/uL (ref 0.0–0.7)
Eosinophils Relative: 9.5 % — ABNORMAL HIGH (ref 0.0–5.0)
GFR calc non Af Amer: 14.45 mL/min (ref >60)
Glucose, Bld: 83 mg/dL (ref 70–99)
HCT: 33.3 % — ABNORMAL LOW (ref 36.0–46.0)
HDL: 43 mg/dL (ref >39.00)
Hemoglobin: 11.1 g/dL — ABNORMAL LOW (ref 12.0–15.0)
Hgb A1c MFr Bld: 5.5 % (ref 4.6–6.5)
LDL Cholesterol: 118 mg/dL — ABNORMAL HIGH (ref 0–99)
Lymphocytes Relative: 27.3 % (ref 12.0–46.0)
Lymphs Abs: 1.6 10*3/uL (ref 0.7–4.0)
MCHC: 33.4 g/dL (ref 30.0–36.0)
MCV: 94.7 fL (ref 78.0–100.0)
Monocytes Absolute: 0.4 10*3/uL (ref 0.1–1.0)
Monocytes Relative: 6.8 % (ref 3.0–12.0)
Neutro Abs: 3.2 10*3/uL (ref 1.4–7.7)
Neutrophils Relative %: 55.2 % (ref 43.0–77.0)
Platelets: 246 10*3/uL (ref 150.0–400.0)
Potassium: 3.9 meq/L (ref 3.5–5.1)
RBC: 3.51 M/uL — ABNORMAL LOW (ref 3.87–5.11)
RDW: 13.3 % (ref 11.5–14.6)
Sodium: 141 meq/L (ref 135–145)
TSH: 14.92 microintl units/mL — ABNORMAL HIGH (ref 0.35–5.50)
Total Bilirubin: 0.6 mg/dL (ref 0.3–1.2)
Total CHOL/HDL Ratio: 4
Total Protein: 7 g/dL (ref 6.0–8.3)
Triglycerides: 115 mg/dL (ref 0.0–149.0)
Uric Acid, Serum: 7.3 mg/dL — ABNORMAL HIGH (ref 2.4–7.0)
VLDL: 23 mg/dL (ref 0.0–40.0)
WBC: 5.9 10*3/uL (ref 4.5–10.5)

## 2009-09-23 ENCOUNTER — Ambulatory Visit: Payer: Self-pay | Admitting: Obstetrics & Gynecology

## 2009-09-25 ENCOUNTER — Encounter: Payer: Self-pay | Admitting: Internal Medicine

## 2009-10-09 ENCOUNTER — Ambulatory Visit: Payer: Self-pay | Admitting: Family Medicine

## 2009-10-14 ENCOUNTER — Telehealth (INDEPENDENT_AMBULATORY_CARE_PROVIDER_SITE_OTHER): Payer: Self-pay | Admitting: *Deleted

## 2009-12-21 ENCOUNTER — Encounter: Payer: Self-pay | Admitting: Internal Medicine

## 2009-12-23 ENCOUNTER — Telehealth (INDEPENDENT_AMBULATORY_CARE_PROVIDER_SITE_OTHER): Payer: Self-pay | Admitting: *Deleted

## 2010-01-18 ENCOUNTER — Encounter: Payer: Self-pay | Admitting: Internal Medicine

## 2010-01-19 ENCOUNTER — Encounter: Payer: Self-pay | Admitting: Internal Medicine

## 2010-03-23 ENCOUNTER — Telehealth: Payer: Self-pay | Admitting: Internal Medicine

## 2010-05-19 ENCOUNTER — Encounter: Payer: Self-pay | Admitting: Internal Medicine

## 2010-06-09 ENCOUNTER — Ambulatory Visit: Payer: Self-pay | Admitting: Internal Medicine

## 2010-06-28 ENCOUNTER — Ambulatory Visit: Payer: Self-pay | Admitting: Surgery

## 2010-07-06 ENCOUNTER — Telehealth (INDEPENDENT_AMBULATORY_CARE_PROVIDER_SITE_OTHER): Payer: Self-pay | Admitting: *Deleted

## 2010-08-23 ENCOUNTER — Telehealth: Payer: Self-pay | Admitting: Internal Medicine

## 2010-09-03 ENCOUNTER — Encounter: Payer: Self-pay | Admitting: Internal Medicine

## 2010-09-05 LAB — CONVERTED CEMR LAB
ALT: 18 units/L (ref 0–35)
AST: 18 units/L (ref 0–37)
Basophils Absolute: 0 10*3/uL (ref 0.0–0.1)
Basophils Relative: 0.4 % (ref 0.0–1.0)
Cholesterol: 188 mg/dL (ref 0–200)
Eosinophils Absolute: 0.5 10*3/uL (ref 0.0–0.6)
Eosinophils Relative: 7.4 % — ABNORMAL HIGH (ref 0.0–5.0)
HCT: 36.3 % (ref 36.0–46.0)
HDL: 33.7 mg/dL — ABNORMAL LOW (ref >39.0)
Hemoglobin: 12.5 g/dL (ref 12.0–15.0)
Hgb A1c MFr Bld: 5.4 % (ref 4.6–6.0)
LDL Cholesterol: 127 mg/dL — ABNORMAL HIGH (ref 0–99)
Lymphocytes Relative: 29.3 % (ref 12.0–46.0)
MCHC: 34.3 g/dL (ref 30.0–36.0)
MCV: 90.3 fL (ref 78.0–100.0)
Monocytes Absolute: 0.4 10*3/uL (ref 0.2–0.7)
Monocytes Relative: 6 % (ref 3.0–11.0)
Neutro Abs: 3.5 10*3/uL (ref 1.4–7.7)
Neutrophils Relative %: 56.9 % (ref 43.0–77.0)
Platelets: 305 10*3/uL (ref 150–400)
RBC: 4.02 M/uL (ref 3.87–5.11)
RDW: 12.3 % (ref 11.5–14.6)
TSH: 0.13 microintl units/mL — ABNORMAL LOW (ref 0.35–5.50)
Total CHOL/HDL Ratio: 5.6
Triglycerides: 139 mg/dL (ref 0–149)
Uric Acid, Serum: 7.8 mg/dL — ABNORMAL HIGH (ref 2.4–7.0)
VLDL: 28 mg/dL (ref 0–40)
WBC: 6.2 10*3/uL (ref 4.5–10.5)

## 2010-09-06 ENCOUNTER — Other Ambulatory Visit: Payer: Self-pay | Admitting: Family Medicine

## 2010-09-06 ENCOUNTER — Other Ambulatory Visit: Payer: Self-pay | Admitting: Internal Medicine

## 2010-09-06 DIAGNOSIS — Z1239 Encounter for other screening for malignant neoplasm of breast: Secondary | ICD-10-CM
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
## 2010-09-09 NOTE — Letter (Signed)
Summary: [facility]   Imported By: Edmonia James 10/15/2009 10:15:35  _____________________________________________________________________  External Attachment:    Type:   Image     Comment:   External Document

## 2010-09-09 NOTE — Progress Notes (Signed)
Summary: Refill Request  Phone Note Refill Request Message from:  Pharmacy on March 23, 2010 3:36 PM  Refills Requested: Medication #1:  COZAAR 100 MG  TABS take one tablet daily   Dosage confirmed as above?Dosage Confirmed   Supply Requested: 3 months   Last Refilled: 09/21/2009  Medication #2:  VERAPAMIL HCL CR 240 MG  TBCR 1 by mouth bid   Dosage confirmed as above?Dosage Confirmed   Supply Requested: 3 months   Last Refilled: 12/23/2009 [facility] [address] pt wants to switch from [facility] to [facility]  Next Appointment Scheduled: none Initial call taken by: Osborn Coho,  March 23, 2010 3:38 PM    Prescriptions: COZAAR 100 MG  TABS (LOSARTAN POTASSIUM) take one tablet daily  #90 x 1   Entered by:   Rebeca Alert MA   Authorized by:   Unice Cobble MD   Signed by:   Rebeca Alert MA on 03/23/2010   Method used:   Faxed to ...       [facility] [phone] (retail)       [place], [place]  [address]       Ph: [phone]       Fax: [phone]   RxID:   [id number] VERAPAMIL HCL CR 240 MG  TBCR (VERAPAMIL HCL) 1 by mouth bid  #90 x 1   Entered by:   Rebeca Alert MA   Authorized by:   Unice Cobble MD   Signed by:   Rebeca Alert MA on 03/23/2010   Method used:   Faxed to ...       [facility] [address] (retail)       [address]       [place], [place]  [address]       Ph: [phone]       Fax: [phone]   RxID:   [id number]

## 2010-09-09 NOTE — Letter (Signed)
Summary: [facility]   Imported By: Edmonia James 06/01/2010 10:28:01  _____________________________________________________________________  External Attachment:    Type:   Image     Comment:   External Document

## 2010-09-09 NOTE — Progress Notes (Signed)
Summary: refill  Phone Note Refill Request Message from:  Pharmacy on August 23, 2010 2:55 PM  Refills Requested: Medication #1:  VERAPAMIL HCL CR 240 MG  TBCR 1 by mouth bid [facility] [phone] left message on triage that the patient wants the above prescription filled there, but they need prescription b/c it has never been filled there. I called patient to inquire if this is correct. Left message on machine to call back to office. Ernestene Mention CMA  August 23, 2010 2:56 PM   Initial call taken by: Ernestene Mention CMA,  August 23, 2010 2:55 PM  Follow-up for Phone Call        Patient notes that she will get her prescriptions from [facility]. She will no longer use [facility] and previous prescription was cancelled. Follow-up by: Ernestene Mention CMA,  August 23, 2010 3:43 PM    Prescriptions: VERAPAMIL HCL CR 240 MG  TBCR (VERAPAMIL HCL) 1 by mouth bid  #90 Tablet x 0   Entered by:   Ernestene Mention CMA   Authorized by:   Unice Cobble MD   Signed by:   Ernestene Mention CMA on 08/23/2010   Method used:   Electronically to        [facility] [phone]* (retail)       [address] [place], [place]  [address]       Ph: [phone]       Fax: [phone]   RxID:   [id number]

## 2010-09-09 NOTE — Progress Notes (Signed)
Summary: Refill request  Phone Note Refill Request Call back at Home Phone [phone] Message from:  Patient  Refills Requested: Medication #1:  ALLOPURINOL 100 MG  TABS take one daily  Medication #2:  VERAPAMIL HCL CR 240 MG  TBCR 1 by mouth bid [facility]   Method Requested: Fax to [facility] Initial call taken by: Georgette Dover,  Dec 23, 2009 2:16 PM  Follow-up for Phone Call        Patient aware rx's sent in Follow-up by: Georgette Dover,  Dec 23, 2009 2:18 PM    Prescriptions: VERAPAMIL HCL CR 240 MG  TBCR (VERAPAMIL HCL) 1 by mouth bid  #90 x 1   Entered by:   Georgette Dover   Authorized by:   Unice Cobble MD   Signed by:   Georgette Dover on 12/23/2009   Method used:   Faxed to ...       [facility] [phone] (retail)       [place], [place]  [address]       Ph: [phone]       Fax: [phone]   RxID:   [id number] ALLOPURINOL 100 MG  TABS (ALLOPURINOL) take one daily  #90 x 1   Entered by:   Georgette Dover   Authorized by:   Unice Cobble MD   Signed by:   Georgette Dover on 12/23/2009   Method used:   Faxed to ...       [facility] [address] (retail)       [address]       [place], [place]  [address]       Ph: [phone]       Fax: [phone]   RxID:   [id number]

## 2010-09-09 NOTE — Assessment & Plan Note (Signed)
Summary: flu shot///sph  Nurse Visit  CC: Flu shot./kb   Allergies: 1)  Ceclor 2)  Lopressor 3)  Naprosyn 4)  Vasotec  Orders Added: 1)  Admin 1st Vaccine [id number] 2)  Flu Vaccine 71yrs + [id number]        Flu Vaccine Consent Questions     Do you have a history of severe allergic reactions to this vaccine? no    Any prior history of allergic reactions to egg and/or gelatin? no    Do you have a sensitivity to the preservative Thimersol? no    Do you have a past history of Guillan-Barre Syndrome? no    Do you currently have an acute febrile illness? no    Have you ever had a severe reaction to latex? no    Vaccine information given and explained to patient? yes    Are you currently pregnant? no    Lot Number:[id number]   Exp Date:02/05/2011   Site Given  Left Deltoid IMu

## 2010-09-09 NOTE — Progress Notes (Signed)
Summary: Refill Request  Phone Note Refill Request Call back at [phone] Message from:  Pharmacy on July 06, 2010 1:28 PM  Refills Requested: Medication #1:  VERAPAMIL HCL CR 240 MG  TBCR 1 by mouth bid   Dosage confirmed as above?Dosage Confirmed   Supply Requested: 3 months   Last Refilled: 03/23/2010 [facility] [address]  Next Appointment Scheduled: none Initial call taken by: Elna Breslow,  July 06, 2010 1:29 PM    Prescriptions: VERAPAMIL HCL CR 240 MG  TBCR (VERAPAMIL HCL) 1 by mouth bid  #90 x 0   Entered by:   Malachi Bonds CMA   Authorized by:   Unice Cobble MD   Signed by:   Malachi Bonds CMA on 07/06/2010   Method used:   Electronically to        [facility] [phone]* (retail)       [address]       [place], [place]  [address]       Ph: [phone]       Fax: [phone]   RxID:   [id number]

## 2010-09-09 NOTE — Letter (Signed)
Summary: Care Consideration Regarding Proteinuria Monitoring/[facility]  Care Consideration Regarding Proteinuria Monitoring/[facility]   Imported By: Edmonia James 01/05/2010 11:53:20  _____________________________________________________________________  External Attachment:    Type:   Image     Comment:   External Document

## 2010-09-09 NOTE — Assessment & Plan Note (Signed)
Summary: med. refill, cpx- jr   Vital Signs:  Patient profile:   65 year old female Height:      65.5 inches Weight:      227.6 pounds BMI:     37.43 Temp:     97.9 degrees F oral Pulse rate:   63 / minute Resp:     14 per minute BP sitting:   144 / 98  (left arm) Cuff size:   large  Vitals Entered By: Georgette Dover (September 08, 2009 1:18 PM) CC: Yearly CPX -not fasting and TB Skin Test, Hypertension Management, Fatigue Comments REVIEWED MED LIST, PATIENT AGREED DOSE AND INSTRUCTION CORRECT    CC:  Yearly CPX -not fasting and TB Skin Test, Hypertension Management, and Fatigue.  History of Present Illness: Shakiah is here for med refills; she describes fatigue. The patient reports persistent fatigue 2 + months @ home described as  fatigue with minimal exertion  and primarily motivational fatigue. "I don't want to clean house".  The patient denies fever, night sweats, weight loss, exertional chest pain, dyspnea, cough, hemoptysis, and new medications.  Other symptoms include daytime sleepiness.  The patient denies the following symptoms: leg swelling, orthopnea, PND, melena, adenopathy, severe snoring, and skin changes.  The patient denies feeling depressed , but she is worried about possible kidney transplant. Ther is no  altered appetite or  poor sleep.    Hypertension History:      She denies headache, chest pain, palpitations, dyspnea with exertion, orthopnea, PND, peripheral edema, visual symptoms, neurologic problems, syncope, and side effects from treatment.  BP higher @ home  not monitored regularly.        Positive major cardiovascular risk factors include female age 18 years old or older, hyperlipidemia, and hypertension.  Negative major cardiovascular risk factors include non-tobacco-user status.        Positive history for target organ damage include renal insufficiency.     Allergies: 1)  Ceclor 2)  Lopressor 3)  Naprosyn 4)  Vasotec  Past History:  Past Medical  History: Hypertension Hypothyroidism Osteoarthritis Focal Segmental Glomerulosclerosis Gout Anisocoria (OS > OD) Colonic polyps, hx of Diverticulosis, colon Osteopenia Vitamin D deficiency  Past Surgical History: Colon polypectomy 2003;Diverticulosis  2007 Hysterectomy & BSO for fibroid; G2P2; Nephrectomy for CA 1993  Family History: Mother deceased @  31 @ Thyroid operation ? Blood clot Father deceased  @ 23 CAD/MI Sister deceased  in  late 7s CAD; sister breast CA; PGM CVA;MGF DM; Paternal FH HTN  Social History: Occupation: Parts Dept @ [employer] Married Never Smoked Alcohol use-yes: minimally Regular exercise-no  Review of Systems Eyes:  Denies blurring, double vision, and vision loss-both eyes. ENT:  Denies difficulty swallowing and hoarseness. Resp:  Denies excessive snoring and morning headaches. GI:  Denies abdominal pain, bloody stools, dark tarry stools, and indigestion. GU:  Denies discharge, dysuria, and hematuria. MS:  Denies joint pain, low back pain, mid back pain, and thoracic pain. Derm:  Denies changes in nail beds, hair loss, and poor wound healing. Neuro:  Denies numbness and tingling. Psych:  Complains of anxiety; denies easily angered, easily tearful, and irritability. Endo:  Complains of cold intolerance and heat intolerance; denies excessive hunger, excessive thirst, and excessive urination.  Physical Exam  General:  well-nourished,in no acute distress; alert,appropriate and cooperative throughout examination Eyes:  No corneal or conjunctival inflammation noted. Perrla.No lid lag  Lungs:  Normal respiratory effort, chest expands symmetrically. Lungs are clear to auscultation, no crackles or  wheezes. Heart:  Normal rate and regular rhythm. S1 and S2 normal without gallop, murmur, click, rub . S4 Abdomen:  Bowel sounds positive,abdomen soft and non-tender without masses, organomegaly or hernias noted. Msk:  No deformity or scoliosis noted of thoracic  or lumbar spine.   Pulses:  R and L carotid,radial,dorsalis pedis and posterior tibial pulses are full and equal bilaterally Extremities:  No clubbing, cyanosis, edema  noted with normal full range of motion of all joints.  Subcutaneous tissue hypertrophy over bone spurs dorsum of feet & 2nd L toe  Neurologic:  alert & oriented X3 and DTRs symmetrical and normal.   Skin:  Intact without suspicious lesions or rashes Cervical Nodes:  No lymphadenopathy noted Axillary Nodes:  No palpable lymphadenopathy Psych:  memory intact for recent and remote, normally interactive, and good eye contact.     Impression & Recommendations:  Problem # 1:  FATIGUE (ICD-780.79)  Problem # 2:  UNSPECIFIED HYPOTHYROIDISM (ICD-244.9)  Her updated medication list for this problem includes:    Synthroid 175 Mcg Tabs (Levothyroxine sodium) .[name]... Take one tab daily  Problem # 3:  UNSPECIFIED ESSENTIAL HYPERTENSION (ICD-401.9)  Her updated medication list for this problem includes:    Cozaar 100 Mg Tabs (Losartan potassium) .[name]... Take one tablet daily    Verapamil Hcl Cr 240 Mg Tbcr (Verapamil hcl) .[name]... 1 by mouth bid    Furosemide 80 Mg Tabs (Furosemide) ..... One by mouth qd    Clonidine Hcl 0.2 Mg Tabs (Clonidine hcl) .[name]... 1 by mouth bid  Orders: EKG w/ Interpretation (93000)  Problem # 4:  OTHER AND UNSPECIFIED HYPERLIPIDEMIA (ICD-272.4)  Problem # 5:  OSTEOPENIA (ICD-733.90) PMH of   Problem # 6:  GOUT NOS (ICD-274.9) PMH of Her updated medication list for this problem includes:    Allopurinol 100 Mg Tabs (Allopurinol) .[name]... Take one daily  Problem # 7:  CHRON GLOMERULONEPHRIT W/LES MEMBRANOUS GLN (ICD-582.1) as per Dr [name]  Problem # 8:  VITAMIN D DEFICIENCY (ICD-268.9) off Vitamin D > 2 months  Complete Medication List: 1)  Synthroid 175 Mcg Tabs (Levothyroxine sodium) .... Take one tab daily 2)  Allopurinol 100 Mg Tabs (Allopurinol) .... Take one daily 3)  Cozaar 100 Mg Tabs (Losartan  potassium) .... Take one tablet daily 4)  Verapamil Hcl Cr 240 Mg Tbcr (Verapamil hcl) .[name].. 1 by mouth bid 5)  Furosemide 80 Mg Tabs (Furosemide) .... One by mouth qd 6)  Clonidine Hcl 0.2 Mg Tabs (Clonidine hcl) .[name].. 1 by mouth bid 7)  Meclizine Hcl 25 Mg Tabs (Meclizine hcl) .[name].. 1 q6-8 hrs as needed  benign positional vertigo 8)  Advil 200 Mg Tabs (Ibuprofen) .[name].. 1 by mouth once daily as needed 9)  Tylenol 325 Mg Tabs (Acetaminophen) .... As needed 10)  Aspirin Adult Low Strength 81 Mg Tbec (Aspirin) .[name].. 1 by mouth once daily 11)  Vitamin D 50000 Unit Caps (Ergocalciferol) .[name].. 1 by mouth weekly  Other Orders: TB Skin Test [id number]) Admin 1st Vaccine [phone])  Hypertension Assessment/Plan:      The patient's hypertensive risk group is category C: Target organ damage and/or diabetes.  Her calculated 10 year risk of coronary heart disease is 27 %.  Today's blood pressure is 144/98.    Patient Instructions: 1)  Please schedule fasting labs: vitamin D level; uric acid; 2)  BMP ; 3)  Hepatic Panel ; 4)  Lipid Panel ; 5)  TSH ; 6)  CBC w/ Diff; 7)  HbgA1C .  8)  Check your Blood Pressure regularly. If it is above:140/90 ON AVERAGE with large cuff  you should make an appointment. Prescriptions: CLONIDINE HCL 0.2 MG  TABS (CLONIDINE HCL) 1 by mouth bid  #180 x 1   Entered and Authorized by:   Unice Cobble MD   Signed by:   Unice Cobble MD on 09/08/2009   Method used:   Electronically to        [facility] (mail-order)             ,          Ph: [phone]       Fax: [phone]   RxID:   [id number] VERAPAMIL HCL CR 240 MG  TBCR (VERAPAMIL HCL) 1 by mouth bid  #90 x 1   Entered and Authorized by:   Unice Cobble MD   Signed by:   Unice Cobble MD on 09/08/2009   Method used:   Electronically to        [facility] (mail-order)             ,          Ph: [phone]       Fax: [phone]   RxID:   [id number] COZAAR 100 MG  TABS (LOSARTAN POTASSIUM)  take one tablet daily  #90 x 1   Entered and Authorized by:   Unice Cobble MD   Signed by:   Unice Cobble MD on 09/08/2009   Method used:   Electronically to        [facility] (mail-order)             ,          Ph: [phone]       Fax: [phone]   [id number] ALLOPURINOL 100 MG  TABS (ALLOPURINOL) take one daily  #90 x 1   Entered and Authorized by:   Unice Cobble MD   Signed by:   Unice Cobble MD on 09/08/2009   Method used:   Electronically to        [facility] (mail-order)             ,          Ph: [phone]       Fax: [phone]   [id number] SYNTHROID 175 MCG  TABS (LEVOTHYROXINE SODIUM) take one tab daily  #90 x 1   Entered and Authorized by:   Unice Cobble MD   Signed by:   Unice Cobble MD on 09/08/2009   Method used:   Electronically to        [facility] (mail-order)             ,          Ph: [phone]       Fax: [phone]   RxIDML:[id number]    Immunizations Administered:  PPD Skin Test:    Vaccine Type: PPD    Site: left forearm    Mfr: [place]    Dose: 0.1 ml    Route: ID    Exp. Date: 01/03/2012    Lot #: [id number]

## 2010-09-09 NOTE — Letter (Signed)
Summary: Colonoscopy Letter  [facility]  [place], [place] [address]   Phone: [phone]  Fax: [phone]      September 03, 2010 MRN: [id number]   Tabitha Green [address] [place], [place]  [address]   Dear [name],   According to your medical record, it is time for you to schedule a Colonoscopy. The American Cancer Society recommends this procedure as a method to detect early colon cancer. Patients with a family history of colon cancer, or a personal history of colon polyps or inflammatory bowel disease are at increased risk.  This letter has been generated based on the recommendations made at the time of your procedure. If you feel that in your particular situation this may no longer apply, please contact our office.  Please call our office at [phone] to schedule this appointment or to update your records at your earliest convenience.  Thank you for cooperating with [place] to provide you with the very best care possible.   Sincerely,  Docia Chuck. Henrene Pastor, M.D.  [facility] [phone]

## 2010-09-09 NOTE — Progress Notes (Signed)
Summary: Records request from [place] for records received from [facility] Chart at [facility] Request forwarded to [facility] via fax for processing. Dena Chavis  October 14, 2009 11:12 AM

## 2010-09-10 ENCOUNTER — Ambulatory Visit: Payer: Self-pay

## 2010-09-10 NOTE — Letter (Signed)
Summary: [facility]   Imported By: Edmonia James 02/03/2010 09:57:10  _____________________________________________________________________  External Attachment:    Type:   Image     Comment:   External Document

## 2010-09-15 ENCOUNTER — Encounter: Payer: Self-pay | Admitting: Internal Medicine

## 2010-09-21 ENCOUNTER — Ambulatory Visit
Admission: RE | Admit: 2010-09-21 | Discharge: 2010-09-21 | Disposition: A | Discharge status: Home / Self Care | Payer: Medicare Other | Source: Ambulatory Visit | Attending: Internal Medicine | Admitting: Internal Medicine

## 2010-09-21 DIAGNOSIS — Z1239 Encounter for other screening for malignant neoplasm of breast: Secondary | ICD-10-CM

## 2010-10-05 NOTE — Letter (Signed)
Summary: [facility]   Imported By: Laural Benes 09/28/2010 15:25:45  _____________________________________________________________________  External Attachment:    Type:   Image     Comment:   External Document

## 2010-10-06 ENCOUNTER — Other Ambulatory Visit: Payer: Self-pay | Admitting: Surgery

## 2010-10-06 ENCOUNTER — Encounter (HOSPITAL_COMMUNITY)
Admission: RE | Admit: 2010-10-06 | Discharge: 2010-10-06 | Disposition: A | Discharge status: Home / Self Care | Payer: Medicare Other | Source: Ambulatory Visit | Attending: Surgery | Admitting: Surgery

## 2010-10-06 ENCOUNTER — Encounter: Payer: Self-pay | Admitting: Internal Medicine

## 2010-10-06 ENCOUNTER — Ambulatory Visit (HOSPITAL_COMMUNITY)
Admission: RE | Admit: 2010-10-06 | Discharge: 2010-10-06 | Disposition: A | Discharge status: Home / Self Care | Payer: Medicare Other | Source: Ambulatory Visit | Attending: Surgery | Admitting: Surgery

## 2010-10-06 DIAGNOSIS — N186 End stage renal disease: Secondary | ICD-10-CM

## 2010-10-06 DIAGNOSIS — Z01810 Encounter for preprocedural cardiovascular examination: Secondary | ICD-10-CM | POA: Insufficient documentation

## 2010-10-06 DIAGNOSIS — I120 Hypertensive chronic kidney disease with stage 5 chronic kidney disease or end stage renal disease: Secondary | ICD-10-CM | POA: Insufficient documentation

## 2010-10-06 DIAGNOSIS — Z01812 Encounter for preprocedural laboratory examination: Secondary | ICD-10-CM | POA: Insufficient documentation

## 2010-10-06 DIAGNOSIS — Z01818 Encounter for other preprocedural examination: Secondary | ICD-10-CM | POA: Insufficient documentation

## 2010-10-06 LAB — BASIC METABOLIC PANEL
BUN: 51 mg/dL — ABNORMAL HIGH (ref 6–23)
CO2: 22 mEq/L (ref 19–32)
Calcium: 9.1 mg/dL (ref 8.4–10.5)
Chloride: 109 mEq/L (ref 96–112)
Creatinine, Ser: 4.3 mg/dL — ABNORMAL HIGH (ref 0.4–1.2)
GFR calc Af Amer: 13 mL/min — ABNORMAL LOW (ref >60)
GFR calc non Af Amer: 10 mL/min — ABNORMAL LOW (ref >60)
Glucose, Bld: 89 mg/dL (ref 70–99)
Potassium: 4.2 mEq/L (ref 3.5–5.1)
Sodium: 140 mEq/L (ref 135–145)

## 2010-10-06 LAB — CBC
HCT: 34 % — ABNORMAL LOW (ref 36.0–46.0)
Hemoglobin: 11.3 g/dL — ABNORMAL LOW (ref 12.0–15.0)
MCH: 29.9 pg (ref 26.0–34.0)
MCHC: 33.2 g/dL (ref 30.0–36.0)
MCV: 89.9 fL (ref 78.0–100.0)
Platelets: 286 10*3/uL (ref 150–400)
RBC: 3.78 MIL/uL — ABNORMAL LOW (ref 3.87–5.11)
RDW: 13.6 % (ref 11.5–15.5)
WBC: 8.6 10*3/uL (ref 4.0–10.5)

## 2010-10-06 LAB — SURGICAL PCR SCREEN
MRSA, PCR: NEGATIVE
Staphylococcus aureus: NEGATIVE

## 2010-10-06 IMAGING — CR DG CHEST 2V
2 series · 2 of 2 positions shown · non-contrast
Comparison: [DATE]

CLINICAL DATA: Hypertension, preop.

CHEST - 2 VIEW

[view not recorded (1 of 2)]
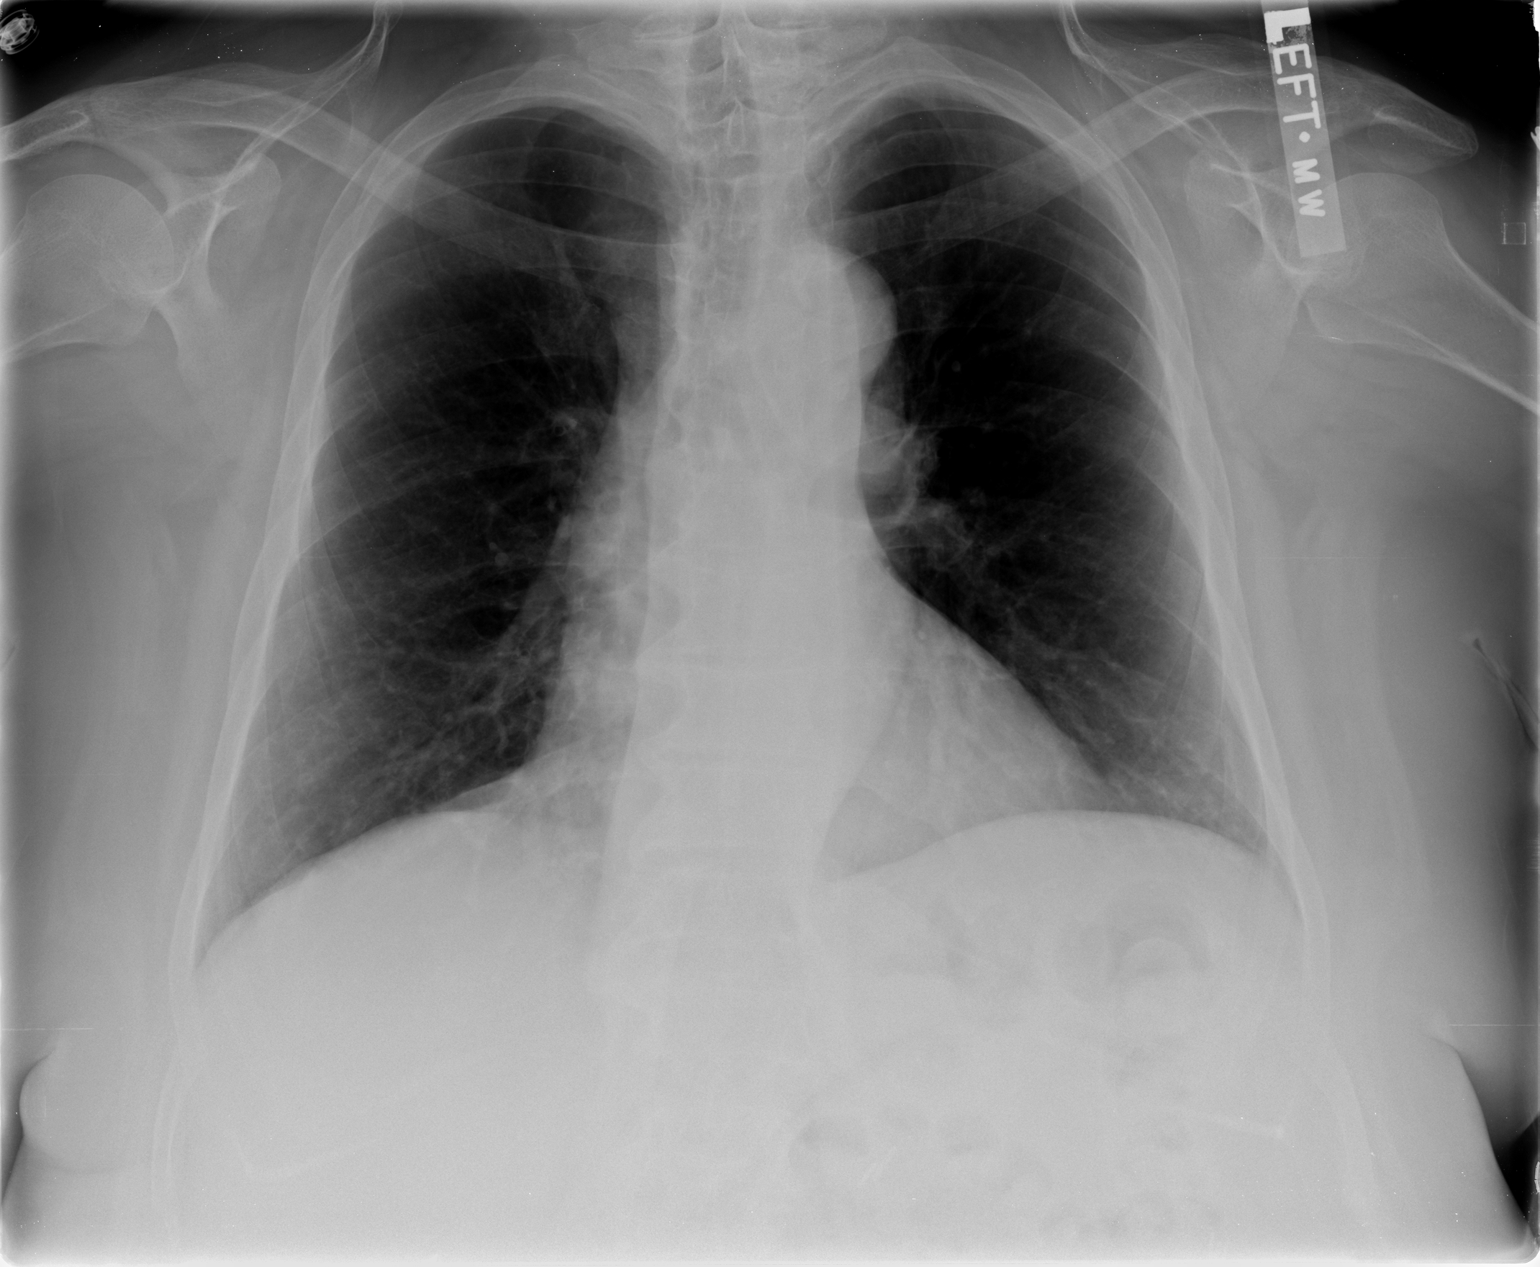

[view not recorded (2 of 2)]
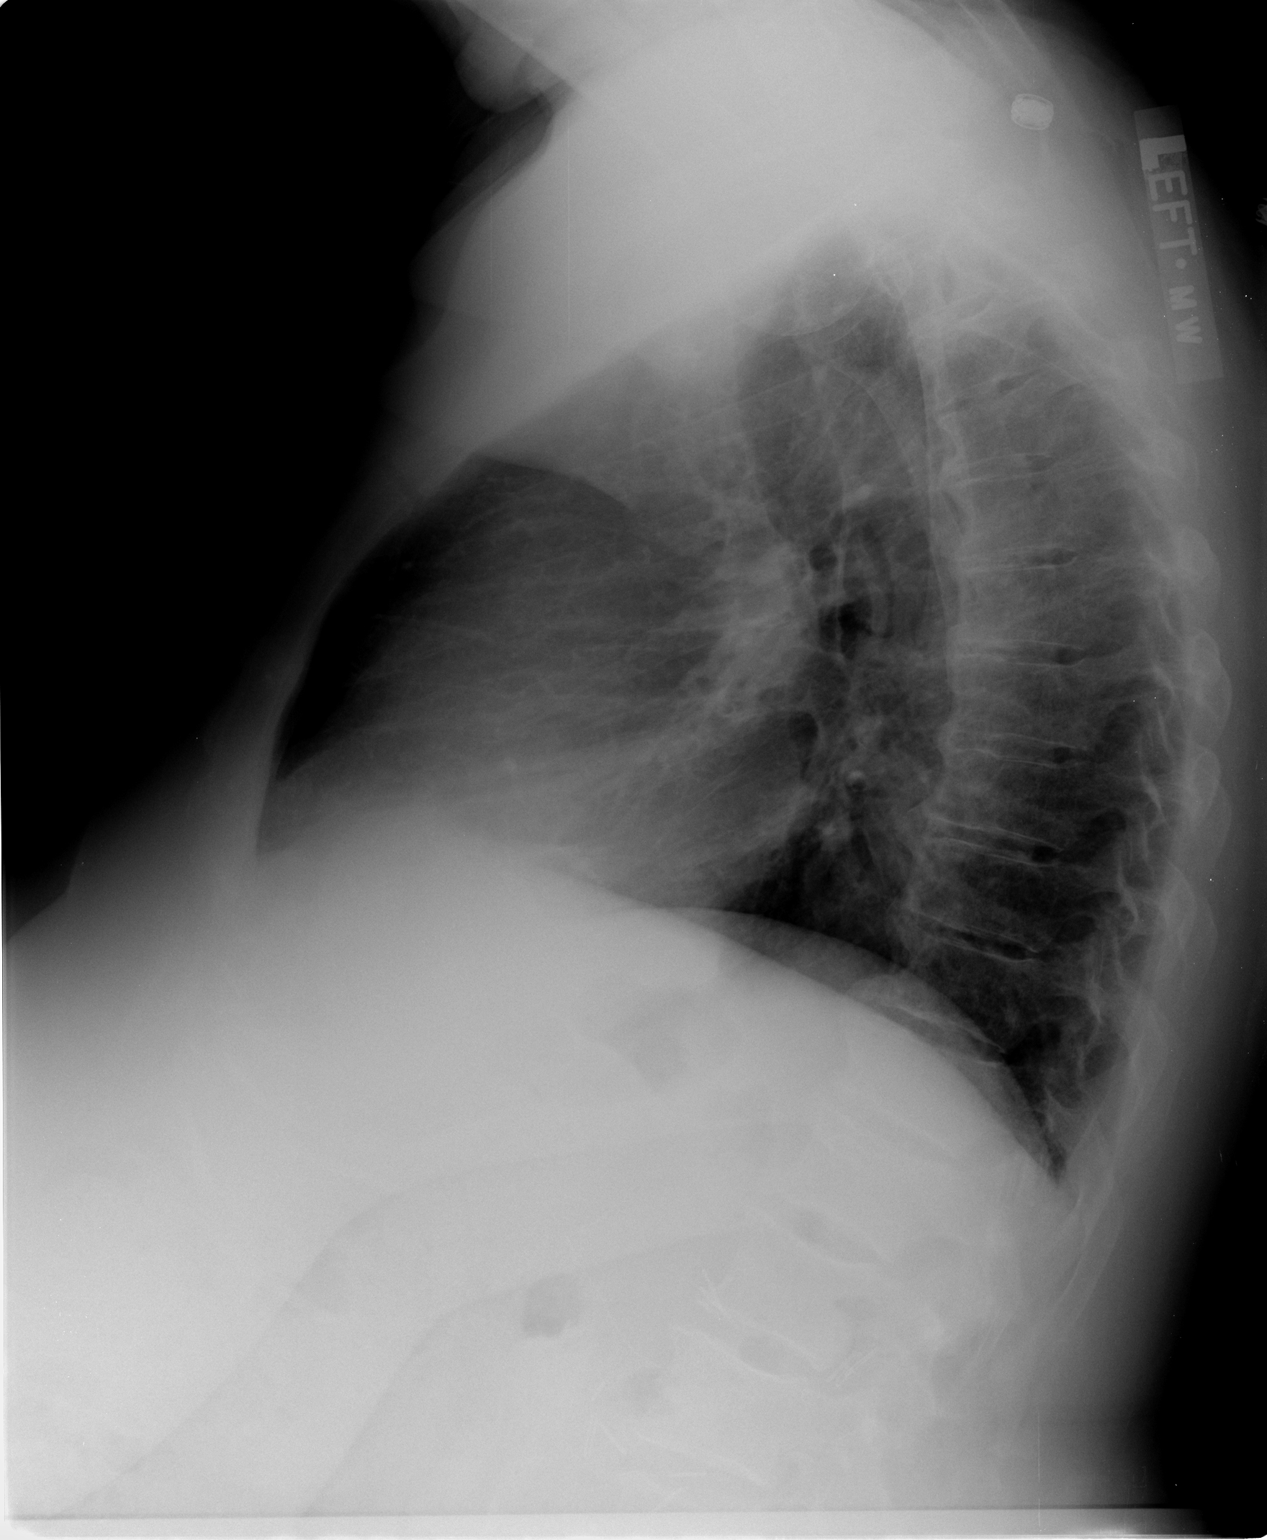

[2 of 2 positions shown; findings below may reference images not displayed]

FINDINGS: Heart and mediastinal contours are within normal limits.
No focal opacities or effusions.  No acute bony abnormality.  Mild
degenerative changes in the thoracic spine.
IMPRESSION: No active cardiopulmonary disease.

## 2010-10-07 ENCOUNTER — Ambulatory Visit (HOSPITAL_COMMUNITY)
Admission: RE | Admit: 2010-10-07 | Discharge: 2010-10-07 | Disposition: A | Discharge status: Home / Self Care | Payer: Medicare Other | Source: Ambulatory Visit | Attending: Surgery | Admitting: Surgery

## 2010-10-07 ENCOUNTER — Encounter: Payer: Self-pay | Admitting: Internal Medicine

## 2010-10-07 DIAGNOSIS — N186 End stage renal disease: Secondary | ICD-10-CM

## 2010-10-07 DIAGNOSIS — I120 Hypertensive chronic kidney disease with stage 5 chronic kidney disease or end stage renal disease: Secondary | ICD-10-CM

## 2010-10-10 NOTE — Op Note (Addendum)
Tabitha Green, Tabitha Green                ACCOUNT NO.:  [id number]  MEDICAL RECORD NO.:  [id number]           PATIENT TYPE:  O  LOCATION:  [facility]                         FACILITY:  [facility]  PHYSICIAN:  Theotis Burrow IV, MDDATE OF BIRTH:  03-13-1946  DATE OF PROCEDURE:  10/07/2010 DATE OF DISCHARGE:  10/07/2010                              OPERATIVE REPORT   PREOPERATIVE DIAGNOSIS:  Chronic kidney disease.  POSTOPERATIVE DIAGNOSIS:  Chronic kidney disease.  PROCEDURE PERFORMED:  Right upper arm AV fistula.  ANESTHESIA:  MAC.  SURGEON: 1. Annamarie Major IV, MD  FINDINGS:  A 4-mm vein at the antecubital crease and adequate cephalic vein at the level of the distal forearm.  PROCEDURE:  The patient was identified in the holding area and taken to room #8, placed supine on the table.  MAC anesthesia was administered. The patient was prepped and draped in the usual fashion.  Time-out was called.  Ultrasound was used to map course of cephalic vein in the upper arm.  The cephalic vein in the mid forearm became very small and I did not feel that it was adequate for fistula creation. At the level of the antecubital crease, the vein was much larger approaching 4-5 mm, it did narrow down to the upper arm, but I felt that this was the best place for her fistula.  1% lidocaine was used for local anesthesia.  A transverse incision was made at the antecubital crease.  Cautery was used to divide the subcutaneous tissue.  This cephalic vein was identified within the wound and circumferentially dissected free and median cubital branch was also dissected out.  The vein was about 5 mm. Next, sharp dissection was used to dissect down through the brachial artery, it was mobilized proximally distally and vessel loops were placed.  The artery was 3.5 mm.  It was disease free.  Next, the patient was given 3000 units of heparin.  The vein was ligated distally with 3-0 silk ties.  I then spatulated the vein by  cutting through the branch of the 2 distal end holes from the branch and the main vein.  The vein that previously had been marked with an ink pen to maintain proper orientation.  Next, #11 blade was used to make an arteriotomy was extended longitudinally with Potts scissors.  The vein was then anastomosed to the artery using a running 6-0 Prolene, prior to completion of the anastomosis, the artery was appropriately flushed. The anastomosis was then completed.  There was a easily palpable thrill in the upper arm.  The patient had a Doppler radial and ulnar signal that only minimally dampened with fistula compression.  I inspected the distal track to make sure that there were no kinks from the soft tissue. All soft tissues were mobilized.  Hemostasis was then achieved.  The wound was irrigated.  The deep tissues were closed with 3-0 Vicryl.  Skin was closed with 3-0 Vicryl and Dermabond was placed.  The patient tolerated the procedure well.  She was taken to the recovery room in stable condition.     Eldridge Abrahams, MD VWB/MEDQ  D:  10/07/2010  T:  10/07/2010  Job:  [id number]  Electronically Signed by Orvan Falconer IV MD on 10/10/2010 07:41:04 PM

## 2010-10-18 ENCOUNTER — Encounter: Payer: Self-pay | Admitting: Internal Medicine

## 2010-10-18 ENCOUNTER — Ambulatory Visit (INDEPENDENT_AMBULATORY_CARE_PROVIDER_SITE_OTHER): Payer: Medicare Other | Admitting: Internal Medicine

## 2010-10-18 DIAGNOSIS — E559 Vitamin D deficiency, unspecified: Secondary | ICD-10-CM

## 2010-10-18 DIAGNOSIS — E785 Hyperlipidemia, unspecified: Secondary | ICD-10-CM

## 2010-10-18 DIAGNOSIS — E039 Hypothyroidism, unspecified: Secondary | ICD-10-CM

## 2010-10-18 DIAGNOSIS — Z86010 Personal history of colonic polyps: Secondary | ICD-10-CM

## 2010-10-18 DIAGNOSIS — I10 Essential (primary) hypertension: Secondary | ICD-10-CM

## 2010-10-18 DIAGNOSIS — M109 Gout, unspecified: Secondary | ICD-10-CM

## 2010-10-19 ENCOUNTER — Encounter: Payer: Self-pay | Admitting: Internal Medicine

## 2010-10-19 ENCOUNTER — Other Ambulatory Visit (INDEPENDENT_AMBULATORY_CARE_PROVIDER_SITE_OTHER): Payer: Medicare Other

## 2010-10-19 ENCOUNTER — Encounter (INDEPENDENT_AMBULATORY_CARE_PROVIDER_SITE_OTHER): Payer: Self-pay | Admitting: *Deleted

## 2010-10-19 ENCOUNTER — Other Ambulatory Visit: Payer: Self-pay | Admitting: Internal Medicine

## 2010-10-19 DIAGNOSIS — M109 Gout, unspecified: Secondary | ICD-10-CM

## 2010-10-19 DIAGNOSIS — E039 Hypothyroidism, unspecified: Secondary | ICD-10-CM

## 2010-10-19 DIAGNOSIS — R7309 Other abnormal glucose: Secondary | ICD-10-CM

## 2010-10-19 DIAGNOSIS — E8881 Metabolic syndrome: Secondary | ICD-10-CM

## 2010-10-19 LAB — HEMOGLOBIN A1C: Hgb A1c MFr Bld: 5.1 % (ref 4.6–6.5)

## 2010-10-19 LAB — LIPID PANEL
Cholesterol: 175 mg/dL (ref 0–200)
HDL: 34.1 mg/dL — ABNORMAL LOW (ref >39.00)
LDL Cholesterol: 119 mg/dL — ABNORMAL HIGH (ref 0–99)
Total CHOL/HDL Ratio: 5
Triglycerides: 110 mg/dL (ref 0.0–149.0)
VLDL: 22 mg/dL (ref 0.0–40.0)

## 2010-10-19 LAB — URIC ACID: Uric Acid, Serum: 6.8 mg/dL (ref 2.4–7.0)

## 2010-10-19 LAB — TSH: TSH: 1.1 u[IU]/mL (ref 0.35–5.50)

## 2010-10-21 LAB — CONVERTED CEMR LAB: Vit D, 25-Hydroxy: 17 ng/mL — ABNORMAL LOW (ref 30–89)

## 2010-10-25 ENCOUNTER — Ambulatory Visit (INDEPENDENT_AMBULATORY_CARE_PROVIDER_SITE_OTHER): Payer: Medicare Other | Admitting: Surgery

## 2010-10-25 DIAGNOSIS — N186 End stage renal disease: Secondary | ICD-10-CM

## 2010-10-26 ENCOUNTER — Other Ambulatory Visit: Payer: Self-pay | Admitting: Internal Medicine

## 2010-10-26 NOTE — Assessment & Plan Note (Signed)
Summary: med refill///sph   Vital Signs:  Patient profile:   65 year old female Height:      65.75 inches Weight:      221 pounds BMI:     36.07 Pulse rate:   78 / minute Resp:     15 per minute BP sitting:   120 / 84  (left arm)  Vitals Entered By: Malachi Bonds CMA (October 18, 2010 1:05 PM) CC: refill on medication    CC:  refill on medication .  History of Present Illness:      This is a 65 year old woman who presents for   follow-up & refill of meds for HTN, hypohyroidism & gout ( see ROS).  The patient reports some fatigue, but denies lightheadedness, urinary frequency, headaches, and edema.  The patient denies the following associated symptoms: chest pain, chest pressure, dyspnea, palpitations, and syncope.  Compliance with medications (by patient report) has been near 100%.  The patient reports that dietary compliance has been good.  The patient reports no exercise.  Adjunctive measures currently used by the patient include salt restriction.  BP not monitored  @ home . Labs done monthly as prelude to transplant; she is on list.                                                                   Dr Abel Presto 02/08 labs reviewed : glucose 104, creat 4.28, GFR 10, & PTH 319.Labs  ,CXray & EKG 02/29 by Dr Trula Slade also reviewed: HCT 34,renal values stable vs 02/08.  Current Medications (verified): 1)  Synthroid 175 Mcg  Tabs (Levothyroxine Sodium) .... Take One Tab Daily Except 1&1/2 On Tues & Thurs 2)  Allopurinol 100 Mg  Tabs (Allopurinol) .... Take One Daily **labs Due** 3)  Cozaar 100 Mg  Tabs (Losartan Potassium) .... Take One Tablet Daily **appointment Due** 4)  Verapamil Hcl Cr 240 Mg  Tbcr (Verapamil Hcl) .[name].. 1 By Mouth Bid 5)  Furosemide 80 Mg  Tabs (Furosemide) .... One By Mouth Qd 6)  Clonidine Hcl 0.2 Mg  Tabs (Clonidine Hcl) .[name].. 1 By Mouth Bid 7)  Meclizine Hcl 25 Mg  Tabs (Meclizine Hcl) .[name].. 1 Q6-8 Hrs As Needed  Benign Positional Vertigo 8)  Advil 200 Mg Tabs (Ibuprofen)  .[name].. 1 By Mouth Once Daily As Needed 9)  Tylenol 325 Mg Tabs (Acetaminophen) .... As Needed 10)  Aspirin Adult Low Strength 81 Mg Tbec (Aspirin) .[name].. 1 By Mouth Once Daily 11)  Oscal 500/200 D-3 500-200 Mg-Unit Tabs (Calcium Carbonate-Vitamin D) .... Take Daily  Allergies (verified): 1)  Ceclor 2)  Lopressor 3)  Naprosyn 4)  Vasotec  Past History:  Past Medical History: Hypertension Hypothyroidism Osteoarthritis Focal Segmental Glomerulosclerosis Gout, PMH of Anisocoria (OS > OD) Colonic polyps, PMH  of Diverticulosis, colon Osteopenia Vitamin D deficiency  Past Surgical History: Colon polypectomy 2003;Diverticulosis  2007 Hysterectomy & BSO for fibroid; G2 P2; Nephrectomy for  malignancy 1993 R arm fistula place 10/2010, Dr Trula Slade  Family History: Mother: deceased @  11 @ Thyroid operation ? 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   Eyes:  Denies blurring, double vision, and vision loss-both eyes. GI:  Occasional constipation. Colonoscopy recall letter received. MS:  Denies joint pain, joint redness, and joint swelling; Last gout attack was ?; on Allopurinol 100 mg once daily . Derm:  Denies changes in nail beds, dryness, and hair loss. Neuro:  Denies numbness and tingling. Psych:  Denies anxiety and depression. Endo:  Complains of cold intolerance; denies excessive hunger, excessive thirst, excessive urination, and heat intolerance.  Physical Exam  General:  well-nourished,in no acute distress; alert,appropriate and cooperative  throughout examination Eyes:  No corneal or conjunctival inflammation noted. EOMI. Perrla.No lid lag Neck:  No deformities, masses, or tenderness noted. thyroid normal Lungs:  Normal respiratory effort, chest expands symmetrically. Lungs are clear to auscultation, no crackles or wheezes. Heart:  Normal rate and regular rhythm. S1 and S2 normal without gallop, murmur, click, rub .S4 with slurring Abdomen:  Bowel sounds positive,abdomen soft and non-tender without masses, organomegaly or hernias noted. Genitalia:  as per Gyn Skin:  Intact without suspicious lesions or rashes. Slight pallor Cervical Nodes:  No lymphadenopathy noted Axillary Nodes:  No palpable lymphadenopathy Psych:  memory intact for recent and remote, normally interactive, and good eye contact.     Impression & Recommendations:  Problem # 1:  UNSPECIFIED ESSENTIAL HYPERTENSION (ICD-401.9) controlled Her updated medication list for this problem includes:    Cozaar 100 Mg Tabs (Losartan potassium) .[name]... Take one tablet daily    Verapamil Hcl Cr 240 Mg Tbcr (Verapamil hcl) .[name]... 1 by mouth bid    Furosemide 80 Mg Tabs (Furosemide) ..... One by mouth qd    Clonidine Hcl 0.2 Mg Tabs (Clonidine hcl) .[name]... 1 by mouth bid  Problem # 2:  UNSPECIFIED HYPOTHYROIDISM (ICD-244.9) F/U overdue Her updated medication list for this problem includes:    Synthroid 175 Mcg Tabs (Levothyroxine sodium) .[name]... Take one tab daily except 1&1/2 on tues & thurs  Problem # 3:  OTHER AND UNSPECIFIED HYPERLIPIDEMIA (ICD-272.4)  Problem # 4:  COLONIC POLYPS, HX OF (ICD-V12.72) as per GI  Problem # 5:  VITAMIN D DEFICIENCY (ICD-268.9)  Problem # 6:  GOUT (ICD-274.9) quiescent; D/C Allopurinol because of CRF  if uric acid level allows Her updated medication list for this problem includes:    Allopurinol 100 Mg Tabs (Allopurinol) .[name]... Take one daily  Complete Medication List: 1)  Synthroid 175 Mcg Tabs (Levothyroxine sodium) .... Take one tab  daily except 1&1/2 on tues & thurs 2)  Allopurinol 100 Mg Tabs (Allopurinol) .... Take one daily 3)  Cozaar 100 Mg Tabs (Losartan potassium) .... Take one tablet daily 4)  Verapamil Hcl Cr 240 Mg Tbcr (Verapamil hcl) .[name].. 1 by mouth bid 5)  Furosemide 80 Mg Tabs (Furosemide) .... One by mouth qd 6)  Clonidine Hcl 0.2 Mg Tabs (Clonidine hcl) .[name].. 1 by mouth bid 7)  Meclizine Hcl 25 Mg Tabs (Meclizine hcl) .[name].. 1 q6-8 hrs as needed  benign positional vertigo 8)  Advil 200 Mg Tabs (Ibuprofen) .[name].. 1 by mouth once daily as needed 9)  Tylenol 325 Mg Tabs (Acetaminophen) .... As needed 10)  Aspirin Adult Low Strength 81 Mg Tbec (Aspirin) .[name].. 1 by mouth once daily 11)  Oscal 500/200 D-3 500-200 Mg-unit  Tabs (Calcium carbonate-vitamin d) .... Take daily  Patient Instructions: 1)  Please schedule fasting labs; see Diagnoses for Codes: Uric Acid; vitamin D level; 2)  Lipid Panel; 3)  TSH ; 4)  HbgA1C. Prescriptions: CLONIDINE HCL 0.2 MG  TABS (CLONIDINE HCL) 1 by mouth bid  #180 x 1   Entered and Authorized by:   Unice Cobble MD   Signed by:   Unice Cobble MD on 10/18/2010   Method used:   Print then Give to Patient   RxID:   [id number] MECLIZINE HCL 25 MG  TABS (MECLIZINE HCL) 1 q6-8 hrs as needed  benign positional vertigo  #30 x 0   Entered and Authorized by:   Unice Cobble MD   Signed by:   Unice Cobble MD on 10/18/2010   Method used:   Print then Give to Patient   RxID:   [id number] FUROSEMIDE 80 MG  TABS (FUROSEMIDE) one by mouth qd  #90 x 1   Entered and Authorized by:   Unice Cobble MD   Signed by:   Unice Cobble MD on 10/18/2010   Method used:   Print then Give to Patient   RxID:   [id number] VERAPAMIL HCL CR 240 MG  TBCR (VERAPAMIL HCL) 1 by mouth bid  #180 x 1   Entered and Authorized by:   Unice Cobble MD   Signed by:   Unice Cobble MD on 10/18/2010   Method used:   Print then Give to Patient   RxID:   [id number] COZAAR 100 MG  TABS  (LOSARTAN POTASSIUM) take one tablet daily  #90 x 1   Entered and Authorized by:   Unice Cobble MD   Signed by:   Unice Cobble MD on 10/18/2010   Method used:   Print then Give to Patient   RxID:   [id number] ALLOPURINOL 100 MG  TABS (ALLOPURINOL) take one daily  #90 x 1   Entered and Authorized by:   Unice Cobble MD   Signed by:   Unice Cobble MD on 10/18/2010   Method used:   Print then Give to Patient   RxID:   [id number] SYNTHROID 175 MCG  TABS (LEVOTHYROXINE SODIUM) take one tab daily EXCEPT 1&1/2 on Tues & Thurs  #90 x 1   Entered and Authorized by:   Unice Cobble MD   Signed by:   Unice Cobble MD on 10/18/2010   Method used:   Print then Give to Patient   RxID:   [id number]    Orders Added: 1)  Est. Patient Level IV [id number]

## 2010-10-26 NOTE — Assessment & Plan Note (Addendum)
OFFICE VISIT  Tabitha Green, Tabitha Green DOB:  February 07, 1946                                       10/25/2010 [id number]  The patient comes back today for followup.  She is status post right upper arm AV fistula on 10/07/2010.  Now there is a good thrill within the fistula near the arteriovenous anastomosis.  The vein appears to be of adequate size.  I am concerned that it may be a little deep as it moves up the upper arm.  I would like to give this another 4-6 weeks to mature before we decide to perform any form of intervention.  I am going to have her come back to see me in 4-6 weeks with an ultrasound to evaluate for stenosis, branches and depth.  I will see her back in 6 weeks.    Eldridge Abrahams, MD Electronically Signed  VWB/MEDQ  D:  10/25/2010  T:  10/26/2010  Job:  [id number]  cc:   Darrold Span. [name], M.D.

## 2010-10-30 ENCOUNTER — Other Ambulatory Visit: Payer: Self-pay | Admitting: Internal Medicine

## 2010-11-04 DIAGNOSIS — N186 End stage renal disease: Secondary | ICD-10-CM

## 2010-11-04 NOTE — Op Note (Signed)
Summary: Right Upper arm AV fistula-Dr. Trula Slade  Right Upper arm AV fistula-Dr. Trula Slade   Imported By: Laural Benes 10/25/2010 13:07:40  _____________________________________________________________________  External Attachment:    Type:   Image     Comment:   External Document

## 2010-11-05 ENCOUNTER — Other Ambulatory Visit: Payer: Self-pay | Admitting: Internal Medicine

## 2010-11-24 ENCOUNTER — Other Ambulatory Visit: Payer: Self-pay | Admitting: Internal Medicine

## 2010-12-06 ENCOUNTER — Ambulatory Visit (INDEPENDENT_AMBULATORY_CARE_PROVIDER_SITE_OTHER): Payer: Medicare Other | Admitting: Surgery

## 2010-12-06 ENCOUNTER — Encounter (INDEPENDENT_AMBULATORY_CARE_PROVIDER_SITE_OTHER): Payer: Medicare Other

## 2010-12-06 DIAGNOSIS — T82898A Other specified complication of vascular prosthetic devices, implants and grafts, initial encounter: Secondary | ICD-10-CM

## 2010-12-06 DIAGNOSIS — N186 End stage renal disease: Secondary | ICD-10-CM

## 2010-12-07 ENCOUNTER — Encounter: Payer: Medicare Other | Admitting: Internal Medicine

## 2010-12-07 NOTE — Assessment & Plan Note (Signed)
OFFICE VISIT  Tabitha Green, Tabitha Green DOB:  01/11/1946                                       12/06/2010 [id number]  The patient comes back today for followup.  She is status post right upper arm AV fistula on 10/07/2010.  She is back today for followup.  On examination there is an excellent thrill up to 2/3 up the arm where I believe the vein just gets deep.  I confirmed this with ultrasound.  Her vein measures over 1 cm in most spots.  There are 2 branches.  However, I do not think these are going to impact maturation.  At this point I am very pleased with how this fistulous is coming.  I do not feel any intervention needs to be performed at this time.  I will see her back on a p.r.n. basis.    Eldridge Abrahams, MD Electronically Signed  VWB/MEDQ  D:  12/06/2010  T:  12/07/2010  Job:  [id number]  cc:   Darrold Span. [name], M.D.

## 2010-12-16 NOTE — Procedures (Unsigned)
VASCULAR LAB EXAM  INDICATION:  Followup of right brachiocephalic Cimino fistula.  HISTORY: Diabetes:  Yes. Cardiac:  No. Hypertension:  Yes.  EXAM:  IMPRESSION:  A maturing right brachiocephalic Cimino fistula.  Proximal anastomosis is 3.22 m/second over 1.95 m/sec.  Multiple branches visualized.  The first branch is 2-3 fingers past the antecubital fossa. The first branch is inferomedially measuring 0.81 cm.  The second branch is 2-3 fingers above the antecubital fossa superolaterally measuring 1.4 cm.  ___________________________________________ V. Leia Alf, MD  OD/MEDQ  D:  12/06/2010  T:  12/06/2010  Job:  [id number]

## 2010-12-21 NOTE — Assessment & Plan Note (Signed)
NAME:  Tabitha Green, Tabitha Green                ACCOUNT NO.:  [id number]   MEDICAL RECORD NO.:  [id number]          PATIENT TYPE:  POB   LOCATION:  [facility]   PHYSICIAN:  Silas Sacramento, MD      DATE OF BIRTH:  [birth year]   DATE OF SERVICE:                                  CLINIC NOTE   NO DICTATION.           ______________________________  Silas Sacramento, MD     KL/MEDQ  D:  09/23/2009  T:  09/24/2009  Job:  [id number]

## 2010-12-21 NOTE — Assessment & Plan Note (Signed)
OFFICE VISIT   Tabitha Green, Tabitha Green  DOB:  09/24/1945                                       06/28/2010  [id number]   REASON FOR VISIT:  Dialysis access.   HISTORY:  This is a 65 year old female seen at the request of Dr. [name]  for access planning.  The patient has a history of left nephrectomy for  renal cell carcinoma.  She has developed focal segmental glomerular  sclerosis in her single right kidney that has progressively gotten  worse.  Her most recent creatinine was 3.85.  She is sent to me to  evaluate for new dialysis access.  She is left-handed.   The patient's suffers from hypertension which is medically managed.  She  is also treated for thyroid disease.   REVIEW OF SYSTEMS:  As above, positive for kidney disease.  All other  review of systems are negative as documented in the encounter form.   PAST MEDICAL HISTORY:  Hypertension, hypothyroidism, history of renal  cell cancer in the left kidney.   PAST SURGICAL HISTORY:  Left radical nephrectomy, hysterectomy and  bilateral salpingo-oophorectomy, renal biopsy.   SOCIAL HISTORY:  She is married with 2 children.  Does not smoke or  drink.   FAMILY HISTORY:  Positive for hypertension, congestive heart failure and  coronary artery disease in her father.  Hypertension in her sister.   ALLERGIES:  Ceclor which causes hives, Lopressor which causes hives and  Vasotec which causes a cough.   PHYSICAL EXAMINATION:  Vital signs:  Heart rate was 61, blood pressure  135/85, O2 sat 98%.  General:  She is well-appearing, in no distress.  HEENT:  Within normal limits.  Lungs:  Clear bilaterally.  No wheezes or  rhonchi.  Cardiovascular:  Regular rate and rhythm, no murmur.  She has  a palpable right brachial and radial pulse.  Abdomen:  Obese, soft.  Musculoskeletal:  Without major deformities.  Neurological:  No focal  deficits.  Skin:  Without rash.   DIAGNOSTIC STUDIES:  Vein mapping was  performed today.  The patient has  adequate cephalic vein on the right beginning in the mid forearm.   ASSESSMENT:  Stage IV chronic kidney disease.   PLAN:  I discussed proceeding with a right-sided fistula with the  patient I would begin personal ultrasound evaluation of the cephalic  vein at the wrist level to see if she would be a candidate for  radiocephalic fistula.  If not, by her mapping study she would be a  candidate for right upper arm fistula.  We discussed the risk of  nonmaturity, the risk of steal syndrome.  The patient would like to  discuss this with her husband and contact me for scheduling her  procedure.  I have encouraged her to get this done in the near future.     Eldridge Abrahams, MD  Electronically Signed   VWB/MEDQ  D:  06/28/2010  T:  06/29/2010  Job:  [id number]   cc:   Darrold Span. [name], M.D.

## 2010-12-21 NOTE — Procedures (Signed)
CEPHALIC VEIN MAPPING   INDICATION:  Preop for AV fistula.   HISTORY:  Chronic renal disease stage IV.   EXAM:  The right cephalic vein is compressible with diameter  measurements ranging from 0.52 to 0.18 cm.   The right basilic vein is compressible with diameter measurements  ranging from 0.45 to 0.21 cm.   The left cephalic vein is compressible with diameter measurements  ranging from 0.57 to 0.30 cm.   The left basilic vein is compressible with diameter measurements ranging  from 0.62 to 0.45 cm.   See attached worksheet for all measurements.   IMPRESSION:  Patent bilateral cephalic and basilic veins with diameter  measurements as described above.   ___________________________________________  V. Leia Alf, M.D.   LT/MEDQ  D:  06/28/2010  T:  06/28/2010  Job:  [id number]

## 2010-12-21 NOTE — Assessment & Plan Note (Signed)
NAME:  Tabitha Green, Tabitha Green NO.:  [id number]   MEDICAL RECORD NO.:  [id number]          PATIENT TYPE:  POB   LOCATION:  [facility]   PHYSICIAN:  Silas Sacramento, MD      DATE OF BIRTH:  Sep 20, 1945   DATE OF SERVICE:                                  CLINIC NOTE   The patient is a 65 year old G2, P2 female who presents for yearly exam.  The patient is in need of a Pap smear for qualification for a kidney  transplant.  The patient had kidney cancer many years ago and then had a  hysterectomy and BSO for fibroids and endometriosis by Dr. Dante Gang in her late 47s.  She then started spilling feeling protein and  then had a subsequent kidney biopsy, was found to have focal segmental  gonorrheal sclerosis.  Her creatinine is now somewhere around 3.4 and  she is trying to qualifying for a kidney transplant.  She does not want  to go on dialysis.  She requires to have a normal Pap smear on paper.  She has had a hysterectomy in the past for benign indications, but we  will do the Pap smear order for her to qualify for a kidney transplant.  I do not think that we need to educate a transplant board on ACOG  guidelines.   PAST MEDICAL HISTORY:  Osteoarthritis, kidney problems as outlined  above, high blood pressure, hyperthyroidism, and kidney cancer.   PAST SURGICAL HISTORY:  Hysterectomy, nephrectomy, and a kidney biopsy.   OBSTETRICAL HISTORY:  NSVD x2.   GYNECOLOGIC HISTORY:  No history of abnormal Pap smears.  Positive  history of fibroids, endometriosis and ovarian cyst, but has had a  hysterectomy and BSO via laparotomy.  Age of menarche was 5.   MEDICATIONS:  Verapamil, Synthroid, clonidine, Lasix and Cozaar.   ALLERGIES:  CECLOR, LOPRESSOR, VASOTEC.   FAMILY HISTORY:  Diabetes in her grandfather.  Heart disease in a  grandmother, grandfather and father.  High blood pressure in sister.  Sister had breast cancer at age 72.  She  has been tested for the BRCA  genes.  Her mother had blood clot in her leg after thyroid surgery.   REVIEW OF SYSTEMS:  Systemic review is positive for weakness and  fatigue.  She has had lightheadedness in the past but not after  meclizine tablets.  She has had leg cramps and easy bruising.   SOCIAL HISTORY:  No history of smoking, alcohol, illegal drugs, physical  or sexual abuse.  She has had 1 sexual partner in the past year.   PHYSICAL EXAMINATION:  VITAL SIGNS:  Blood pressure 148/91, pulse 69,  weight 227, height 62.5 inches.  GENERAL:  Well nourished, well developed, no apparent distress.  HEENT:  Normocephalic, atraumatic.  Good dentition.  Thyroid, no masses.  The patient differ the rest of the exam as she sees a general  practitioner regularly.  She has had a normal mammogram in the past  year.  ABDOMEN:  Soft, nontender.  No organomegaly.  No hernia.  PELVIC:  Genitalia, Tanner V with sparse pubic hair.  Vagina, atrophic  with intact vaginal cuff.  No masses on the bimanual exam.  All pelvic  organs are surgically absent.  Rectovaginal, no masses or stool in the  vault.  EXTREMITIES:  Nontender.   ASSESSMENT AND PLAN:  A 65 year old G2, P2 female, menopausal for Pap  smear to hopefully qualify for a kidney transplant.  1. Pap smear done.  2. Mammogram up-to-date per patient.  3. We will mail a copy of the Pap smear to the patient to hopefully      aid her in getting a kidney transplant.  4. Follow up with yearly mammograms.  5. Follow up with her sister and BRCA testing.  6. Follow up with [place] in a year.           ______________________________  Silas Sacramento, MD     KL/MEDQ  D:  09/23/2009  T:  09/24/2009  Job:  [id number]

## 2011-04-19 ENCOUNTER — Other Ambulatory Visit: Payer: Self-pay | Admitting: Internal Medicine

## 2011-05-24 ENCOUNTER — Other Ambulatory Visit: Payer: Self-pay | Admitting: Internal Medicine

## 2011-06-01 ENCOUNTER — Ambulatory Visit (INDEPENDENT_AMBULATORY_CARE_PROVIDER_SITE_OTHER): Payer: Medicare Other

## 2011-06-01 ENCOUNTER — Ambulatory Visit: Payer: Medicare Other

## 2011-06-01 DIAGNOSIS — Z23 Encounter for immunization: Secondary | ICD-10-CM

## 2011-06-01 DIAGNOSIS — Z111 Encounter for screening for respiratory tuberculosis: Secondary | ICD-10-CM

## 2011-06-14 ENCOUNTER — Other Ambulatory Visit: Payer: Self-pay | Admitting: Internal Medicine

## 2011-07-28 ENCOUNTER — Encounter (HOSPITAL_COMMUNITY)
Admission: RE | Admit: 2011-07-28 | Discharge: 2011-07-28 | Disposition: A | Discharge status: Home / Self Care | Payer: Medicare Other | Source: Ambulatory Visit | Attending: Nephrology | Admitting: Nephrology

## 2011-07-28 ENCOUNTER — Encounter (HOSPITAL_COMMUNITY): Payer: Medicare Other

## 2011-07-28 DIAGNOSIS — N185 Chronic kidney disease, stage 5: Secondary | ICD-10-CM | POA: Insufficient documentation

## 2011-07-28 DIAGNOSIS — D638 Anemia in other chronic diseases classified elsewhere: Secondary | ICD-10-CM | POA: Insufficient documentation

## 2011-07-28 LAB — POCT HEMOGLOBIN-HEMACUE: Hemoglobin: 9.2 g/dL — ABNORMAL LOW (ref 12.0–15.0)

## 2011-07-28 MED ORDER — EPOETIN ALFA 20000 UNIT/ML IJ SOLN
20000.0000 [IU] | INTRAMUSCULAR | Status: DC
  Administered 2011-07-28: 20000 [IU] via SUBCUTANEOUS

## 2011-07-28 MED ORDER — EPOETIN ALFA 20000 UNIT/ML IJ SOLN
INTRAMUSCULAR | Status: AC
  Administered 2011-07-28: 20000 [IU] via SUBCUTANEOUS
  Filled 2011-07-28: qty 1

## 2011-08-09 LAB — HM COLONOSCOPY

## 2011-08-11 ENCOUNTER — Encounter (HOSPITAL_COMMUNITY)
Admission: RE | Admit: 2011-08-11 | Discharge: 2011-08-11 | Disposition: A | Discharge status: Home / Self Care | Payer: Medicare Other | Source: Ambulatory Visit | Attending: Nephrology | Admitting: Nephrology

## 2011-08-11 DIAGNOSIS — N185 Chronic kidney disease, stage 5: Secondary | ICD-10-CM | POA: Insufficient documentation

## 2011-08-11 DIAGNOSIS — D638 Anemia in other chronic diseases classified elsewhere: Secondary | ICD-10-CM | POA: Insufficient documentation

## 2011-08-11 MED ORDER — EPOETIN ALFA 20000 UNIT/ML IJ SOLN
20000.0000 [IU] | INTRAMUSCULAR | Status: DC
  Administered 2011-08-11: 20000 [IU] via SUBCUTANEOUS

## 2011-08-11 MED ORDER — EPOETIN ALFA 20000 UNIT/ML IJ SOLN
INTRAMUSCULAR | Status: AC
  Administered 2011-08-11: 20000 [IU] via SUBCUTANEOUS
  Filled 2011-08-11: qty 1

## 2011-08-25 ENCOUNTER — Encounter (HOSPITAL_COMMUNITY)
Admission: RE | Admit: 2011-08-25 | Discharge: 2011-08-25 | Disposition: A | Discharge status: Home / Self Care | Payer: Medicare Other | Source: Ambulatory Visit | Attending: Nephrology | Admitting: Nephrology

## 2011-08-25 LAB — IRON AND TIBC
Iron: 100 ug/dL (ref 42–135)
Saturation Ratios: 37 % (ref 20–55)
TIBC: 267 ug/dL (ref 250–470)

## 2011-08-25 MED ORDER — EPOETIN ALFA 20000 UNIT/ML IJ SOLN
INTRAMUSCULAR | Status: AC
  Filled 2011-08-25: qty 1

## 2011-08-25 MED ORDER — EPOETIN ALFA 20000 UNIT/ML IJ SOLN
20000.0000 [IU] | INTRAMUSCULAR | Status: DC
  Administered 2011-08-25: 20000 [IU] via SUBCUTANEOUS

## 2011-08-26 LAB — FERRITIN: Ferritin: 61 ng/mL (ref 10–291)

## 2011-08-26 LAB — POCT HEMOGLOBIN-HEMACUE: Hemoglobin: 11.2 g/dL — ABNORMAL LOW (ref 12.0–15.0)

## 2011-09-08 ENCOUNTER — Encounter (HOSPITAL_COMMUNITY)
Admission: RE | Admit: 2011-09-08 | Discharge: 2011-09-08 | Disposition: A | Discharge status: Home / Self Care | Payer: Medicare Other | Source: Ambulatory Visit | Attending: Nephrology | Admitting: Nephrology

## 2011-09-08 NOTE — Progress Notes (Signed)
Called and spoke with Dr. Abel Presto assistant Arville Go regarding pt's high BP despite several BP measurements and resting pt for 30 minutes.  Reported that pt stated she had not had her verapamil today.  Dr. [name] stated to reschedule the pt to return another day per [facility].

## 2011-09-13 ENCOUNTER — Emergency Department (INDEPENDENT_AMBULATORY_CARE_PROVIDER_SITE_OTHER)
Admission: EM | Admit: 2011-09-13 | Discharge: 2011-09-13 | Disposition: A | Discharge status: Home / Self Care | Payer: Medicare Other | Source: Home / Self Care | Attending: Emergency Medicine | Admitting: Emergency Medicine

## 2011-09-13 ENCOUNTER — Encounter: Payer: Self-pay | Admitting: *Deleted

## 2011-09-13 DIAGNOSIS — Z23 Encounter for immunization: Secondary | ICD-10-CM

## 2011-09-13 DIAGNOSIS — S61409A Unspecified open wound of unspecified hand, initial encounter: Secondary | ICD-10-CM

## 2011-09-13 HISTORY — DX: Essential (primary) hypertension: I10

## 2011-09-13 HISTORY — DX: Unspecified nephritic syndrome with focal and segmental glomerular lesions: N05.1

## 2011-09-13 MED ORDER — TETANUS-DIPHTH-ACELL PERTUSSIS 5-2.5-18.5 LF-MCG/0.5 IM SUSP
0.5000 mL | Freq: Once | INTRAMUSCULAR | Status: AC
  Administered 2011-09-13: 0.5 mL via INTRAMUSCULAR

## 2011-09-13 MED ORDER — CEPHALEXIN 500 MG PO CAPS: 500.0000 mg | ORAL_CAPSULE | Freq: Three times a day (TID) | ORAL | Status: AC

## 2011-09-13 NOTE — ED Notes (Signed)
Patient cut her right hand on a box cutter this AM. The laceration is no longer bleeding. She is unsure of her last TDaP.

## 2011-09-13 NOTE — ED Provider Notes (Signed)
History     CSN: [id number]  Arrival date & time 09/13/11  1600   First MD Initiated Contact with Patient 09/13/11 1608      Chief Complaint  Patient presents with  . Extremity Laceration    right hand    (Consider location/radiation/quality/duration/timing/severity/associated sxs/prior treatment) HPI Is a left-handed female comes in today with a laceration on her right palm. She was cutting cardboard box with a box cutter today and injured the right thenar eminence. She states that it bled a lot at that time and is still sore but the bleeding stopped with pressure. She wasn't necessarily concerned with a laceration but thought that she needed a tetanus shot. She does not recall the last tetanus shot but she had. No current fever or chills or nausea or vomiting.  Past Medical History  Diagnosis Date  . Hypertension   . FSGS (focal segmental glomerulosclerosis)     Past Surgical History  Procedure Date  . Abdominal hysterectomy   . Nephrectomy     Family History  Problem Relation Age of Onset  . Deep vein thrombosis Mother   . Heart attack Father   . Cancer Sister     breast    History  Substance Use Topics  . Smoking status: Never Smoker   . Smokeless tobacco: Not on file  . Alcohol Use: Yes     rare    OB History    Grav Para Term Preterm Abortions TAB SAB Ect Mult Living                  Review of Systems  Allergies  Cefaclor; Enalapril maleate; Metoprolol tartrate; and Naproxen  Home Medications   Current Outpatient Rx  Name Route Sig Dispense Refill  . ALLOPURINOL 100 MG PO TABS  TAKE ONE TABLET BY MOUTH DAILY 30 tablet 5  . CEPHALEXIN 500 MG PO CAPS Oral Take 1 capsule (500 mg total) by mouth 3 (three) times daily. 21 capsule 0  . CLONIDINE HCL 0.2 MG PO TABS  TAKE 1 TABLET BY MOUTH TWICE A DAY 60 tablet 11  . FUROSEMIDE 80 MG PO TABS  take 1 tablet by mouth once daily 30 tablet 11  . LEVOTHYROXINE SODIUM 175 MCG PO TABS  TAKE 1 TABLET BY MOUTH  ONCE DAILY *EXCEPT WEDNESDAY 1 AND 1/2 TABLET 34 tablet 11  . LOSARTAN POTASSIUM 100 MG PO TABS  TAKE ONE TABLET BY MOUTH DAILY 90 tablet 1  . VERAPAMIL HCL ER 240 MG PO TBCR  take 1 tablet by mouth twice a day 180 tablet 1    BP 169/82  Pulse 63  Temp(Src) 98.6 F (37 C) (Oral)  Resp 14  Ht 5\' 7"  (1.702 m)  Wt 213 lb (96.616 kg)  BMI 33.36 kg/m2  SpO2 97%  Physical Exam  Nursing note and vitals reviewed. Constitutional: She is oriented to person, place, and time. She appears well-developed and well-nourished.  HENT:  Head: Normocephalic and atraumatic.  Eyes: No scleral icterus.  Neck: Neck supple.  Cardiovascular: Regular rhythm and normal heart sounds.   Pulmonary/Chest: Effort normal and breath sounds normal. No respiratory distress.  Neurological: She is alert and oriented to person, place, and time.  Skin: Skin is warm and dry.       Right hand thenar eminence has a superficial laceration approximately 1 cm in length. There is no bleeding or signs of infection. It is minimally tender to palpation. She has full range of motion of all  of her digits and her distal neurovascular status is intact. I do not see any retained foreign bodies.  Psychiatric: She has a normal mood and affect. Her speech is normal.    ED Course  Procedures (including critical care time)  Labs Reviewed - No data to display No results found.   1. Wound, open, hand with or without fingers       MDM  Risks, benefits and alternatives discussed with patient.  They voice understanding. Discussed benefits and risks of procedure and verbal consent obtained.  Using sterile technique, cleansed wound with hibaclens followed by copious lavage with normal saline and iodine.  Wound carefully inspected for debris and foreign bodies; none found.  Wound closed with Dermabond and steristrips.  Bacitracin and non-stick sterile dressing applied.  Wound precautions explained to patient.   We updated her tetanus shot  as well. I discussed wound precautions with her. I do not feel that she needs antibiotics at this time, however I did give her prescription for Keflex to hold onto in case there are signs of infection (she has taken Keflex in the past without problems). I also do not feel that she requires any followup as this is a very small and superficial laceration. If any further problems, she should needs to followup either here or with her PCP.    Desiree Lucy, MD 09/13/11 [phone]

## 2011-09-15 ENCOUNTER — Encounter (HOSPITAL_COMMUNITY)
Admission: RE | Admit: 2011-09-15 | Discharge: 2011-09-15 | Disposition: A | Discharge status: Home / Self Care | Payer: Medicare Other | Source: Ambulatory Visit | Attending: Nephrology | Admitting: Nephrology

## 2011-09-15 DIAGNOSIS — N185 Chronic kidney disease, stage 5: Secondary | ICD-10-CM | POA: Insufficient documentation

## 2011-09-15 DIAGNOSIS — D638 Anemia in other chronic diseases classified elsewhere: Secondary | ICD-10-CM | POA: Insufficient documentation

## 2011-09-15 MED ORDER — EPOETIN ALFA 20000 UNIT/ML IJ SOLN
20000.0000 [IU] | INTRAMUSCULAR | Status: DC
  Administered 2011-09-15: 20000 [IU] via SUBCUTANEOUS

## 2011-09-30 ENCOUNTER — Encounter (HOSPITAL_COMMUNITY)
Admission: RE | Admit: 2011-09-30 | Discharge: 2011-09-30 | Disposition: A | Discharge status: Home / Self Care | Payer: Medicare Other | Source: Ambulatory Visit | Attending: Nephrology | Admitting: Nephrology

## 2011-09-30 LAB — POCT HEMOGLOBIN-HEMACUE: Hemoglobin: 12.1 g/dL (ref 12.0–15.0)

## 2011-09-30 MED ORDER — EPOETIN ALFA 20000 UNIT/ML IJ SOLN: 20000.0000 [IU] | INTRAMUSCULAR | Status: DC

## 2011-10-07 ENCOUNTER — Ambulatory Visit (INDEPENDENT_AMBULATORY_CARE_PROVIDER_SITE_OTHER): Payer: Medicare Other | Admitting: Internal Medicine

## 2011-10-07 VITALS — BP 160/96 | HR 63 | Temp 98.5°F | Wt 210.0 lb

## 2011-10-07 DIAGNOSIS — IMO0002 Reserved for concepts with insufficient information to code with codable children: Secondary | ICD-10-CM | POA: Insufficient documentation

## 2011-10-07 DIAGNOSIS — R29898 Other symptoms and signs involving the musculoskeletal system: Secondary | ICD-10-CM

## 2011-10-07 NOTE — Progress Notes (Signed)
  Subjective:    Patient ID: SUMMAR COOLE, female    DOB: 1945/12/01, 66 y.o.   MRN: [id number]  HPI Acute visit Found a knot under the arm, concerned b/c has a FH of breast ca; she reports is due for a MMG  Past Medical History: CKD, transplant list in [place] Hypertension Hypothyroidism Osteoarthritis Focal Segmental Glomerulosclerosis Gout, PMH of Anisocoria (OS > OD) Colonic polyps, PMH  of Diverticulosis, colon Osteopenia Vitamin D deficiency Kidney cancer 1993  Past Surgical History: Colon polypectomy 2003;Diverticulosis  2007 Hysterectomy & BSO for fibroid; G2 P2; Nephrectomy for  malignancy 1993 R arm fistula place 10/2010, Dr Trula Slade   Review of Systems She does SBE and is ok Last MMG 2012, reports it was wnl Denies f/c No d/c from the armpit     Objective:   Physical Exam  Skin:       A, Ox3 Chest wall normal L axillary area normal, R has a superficial 1/2 cm soft mass not attached to deeper structures, ? probably attached to the skin. No fluctuance, d/c, redness or tenderness. Skin is normal in color. Breast: no dominant mass on either side, skin normal      Assessment & Plan:

## 2011-10-07 NOTE — Assessment & Plan Note (Signed)
Has a knot in the R axila, most likely benign, sebaceous?. It os not infected and unlikely to be related to any breast pathology. Plan: rec observation, see instructions

## 2011-10-07 NOTE — Patient Instructions (Signed)
Go ahead and do your  mammogram this month Keep an eye on the area of concern, if the area gets bigger, red, dark, swells up, or is not going away in 6 weeks---> please come back and let us recheck. Also remember that you need to get a yearly l exam with your  primary doctor. Please schedule one

## 2011-10-09 ENCOUNTER — Encounter: Payer: Self-pay | Admitting: Internal Medicine

## 2011-10-14 ENCOUNTER — Encounter (HOSPITAL_COMMUNITY)
Admission: RE | Admit: 2011-10-14 | Discharge: 2011-10-14 | Disposition: A | Discharge status: Home / Self Care | Payer: Medicare Other | Source: Ambulatory Visit | Attending: Nephrology | Admitting: Nephrology

## 2011-10-14 DIAGNOSIS — D638 Anemia in other chronic diseases classified elsewhere: Secondary | ICD-10-CM | POA: Insufficient documentation

## 2011-10-14 DIAGNOSIS — N185 Chronic kidney disease, stage 5: Secondary | ICD-10-CM | POA: Insufficient documentation

## 2011-10-14 LAB — POCT HEMOGLOBIN-HEMACUE: Hemoglobin: 11.5 g/dL — ABNORMAL LOW (ref 12.0–15.0)

## 2011-10-14 MED ORDER — EPOETIN ALFA 20000 UNIT/ML IJ SOLN
20000.0000 [IU] | INTRAMUSCULAR | Status: DC
  Administered 2011-10-14: 20000 [IU] via SUBCUTANEOUS

## 2011-10-14 MED ORDER — EPOETIN ALFA 20000 UNIT/ML IJ SOLN
INTRAMUSCULAR | Status: AC
  Filled 2011-10-14: qty 1

## 2011-10-17 LAB — POCT HEMOGLOBIN-HEMACUE: Hemoglobin: 10.6 g/dL — ABNORMAL LOW (ref 12.0–15.0)

## 2011-10-27 ENCOUNTER — Other Ambulatory Visit: Payer: Self-pay | Admitting: Internal Medicine

## 2011-10-28 ENCOUNTER — Encounter (HOSPITAL_COMMUNITY)
Admission: RE | Admit: 2011-10-28 | Discharge: 2011-10-28 | Disposition: A | Discharge status: Home / Self Care | Payer: Medicare Other | Source: Ambulatory Visit | Attending: Nephrology | Admitting: Nephrology

## 2011-10-28 LAB — IRON AND TIBC
Saturation Ratios: 45 % (ref 20–55)
TIBC: 253 ug/dL (ref 250–470)

## 2011-10-28 MED ORDER — EPOETIN ALFA 20000 UNIT/ML IJ SOLN
INTRAMUSCULAR | Status: AC
  Filled 2011-10-28: qty 1

## 2011-10-28 MED ORDER — EPOETIN ALFA 20000 UNIT/ML IJ SOLN
20000.0000 [IU] | INTRAMUSCULAR | Status: DC
  Administered 2011-10-28: 20000 [IU] via SUBCUTANEOUS

## 2011-10-28 NOTE — Telephone Encounter (Signed)
Patient needs to schedule a CPX  

## 2011-11-08 ENCOUNTER — Other Ambulatory Visit (HOSPITAL_COMMUNITY): Payer: Self-pay | Admitting: *Deleted

## 2011-11-11 ENCOUNTER — Encounter (HOSPITAL_COMMUNITY)
Admission: RE | Admit: 2011-11-11 | Discharge: 2011-11-11 | Disposition: A | Discharge status: Home / Self Care | Payer: Medicare Other | Source: Ambulatory Visit | Attending: Nephrology | Admitting: Nephrology

## 2011-11-11 DIAGNOSIS — D638 Anemia in other chronic diseases classified elsewhere: Secondary | ICD-10-CM | POA: Insufficient documentation

## 2011-11-11 DIAGNOSIS — N185 Chronic kidney disease, stage 5: Secondary | ICD-10-CM | POA: Insufficient documentation

## 2011-11-11 MED ORDER — EPOETIN ALFA 20000 UNIT/ML IJ SOLN
INTRAMUSCULAR | Status: AC
  Filled 2011-11-11: qty 1

## 2011-11-11 MED ORDER — EPOETIN ALFA 20000 UNIT/ML IJ SOLN
20000.0000 [IU] | INTRAMUSCULAR | Status: DC
  Administered 2011-11-11: 20000 [IU] via SUBCUTANEOUS

## 2011-11-21 ENCOUNTER — Other Ambulatory Visit (HOSPITAL_COMMUNITY): Payer: Self-pay | Admitting: *Deleted

## 2011-11-24 ENCOUNTER — Encounter (HOSPITAL_COMMUNITY): Payer: Medicare Other

## 2011-11-27 ENCOUNTER — Other Ambulatory Visit: Payer: Self-pay | Admitting: Internal Medicine

## 2011-11-28 NOTE — Telephone Encounter (Signed)
Patient needs to schedule a CPX  

## 2011-12-22 ENCOUNTER — Other Ambulatory Visit: Payer: Self-pay | Admitting: Internal Medicine

## 2011-12-22 NOTE — Telephone Encounter (Signed)
tsh 244.9

## 2011-12-23 ENCOUNTER — Encounter (HOSPITAL_COMMUNITY)
Admission: RE | Admit: 2011-12-23 | Discharge: 2011-12-23 | Disposition: A | Discharge status: Home / Self Care | Payer: Medicare Other | Source: Ambulatory Visit | Attending: Nephrology | Admitting: Nephrology

## 2011-12-23 DIAGNOSIS — N185 Chronic kidney disease, stage 5: Secondary | ICD-10-CM | POA: Insufficient documentation

## 2011-12-23 DIAGNOSIS — D638 Anemia in other chronic diseases classified elsewhere: Secondary | ICD-10-CM | POA: Insufficient documentation

## 2011-12-23 LAB — IRON AND TIBC
Saturation Ratios: 65 % — ABNORMAL HIGH (ref 20–55)
UIBC: 79 ug/dL — ABNORMAL LOW (ref 125–400)

## 2011-12-23 LAB — FERRITIN: Ferritin: 194 ng/mL (ref 10–291)

## 2011-12-23 LAB — POCT HEMOGLOBIN-HEMACUE: Hemoglobin: 11 g/dL — ABNORMAL LOW (ref 12.0–15.0)

## 2011-12-23 MED ORDER — EPOETIN ALFA 20000 UNIT/ML IJ SOLN
20000.0000 [IU] | INTRAMUSCULAR | Status: DC
  Administered 2011-12-23: 20000 [IU] via SUBCUTANEOUS
  Filled 2011-12-23: qty 1

## 2012-01-16 ENCOUNTER — Other Ambulatory Visit: Payer: Self-pay | Admitting: Internal Medicine

## 2012-01-19 ENCOUNTER — Encounter (HOSPITAL_COMMUNITY)
Admission: RE | Admit: 2012-01-19 | Discharge: 2012-01-19 | Disposition: A | Discharge status: Home / Self Care | Payer: Medicare Other | Source: Ambulatory Visit | Attending: Nephrology | Admitting: Nephrology

## 2012-01-19 DIAGNOSIS — D638 Anemia in other chronic diseases classified elsewhere: Secondary | ICD-10-CM | POA: Insufficient documentation

## 2012-01-19 DIAGNOSIS — N185 Chronic kidney disease, stage 5: Secondary | ICD-10-CM | POA: Insufficient documentation

## 2012-01-19 LAB — IRON AND TIBC
Iron: 132 ug/dL (ref 42–135)
Saturation Ratios: 59 % — ABNORMAL HIGH (ref 20–55)
UIBC: 93 ug/dL — ABNORMAL LOW (ref 125–400)

## 2012-01-19 MED ORDER — EPOETIN ALFA 20000 UNIT/ML IJ SOLN
INTRAMUSCULAR | Status: AC
  Filled 2012-01-19: qty 1

## 2012-01-19 MED ORDER — EPOETIN ALFA 20000 UNIT/ML IJ SOLN
20000.0000 [IU] | INTRAMUSCULAR | Status: DC
  Administered 2012-01-19: 20000 [IU] via SUBCUTANEOUS

## 2012-02-02 ENCOUNTER — Other Ambulatory Visit: Payer: Self-pay | Admitting: Internal Medicine

## 2012-02-02 NOTE — Telephone Encounter (Signed)
Refill done.  

## 2012-02-15 ENCOUNTER — Other Ambulatory Visit: Payer: Self-pay | Admitting: Internal Medicine

## 2012-02-15 DIAGNOSIS — Z139 Encounter for screening, unspecified: Secondary | ICD-10-CM

## 2012-02-16 ENCOUNTER — Encounter (HOSPITAL_COMMUNITY)
Admission: RE | Admit: 2012-02-16 | Discharge: 2012-02-16 | Disposition: A | Discharge status: Home / Self Care | Payer: Medicare Other | Source: Ambulatory Visit | Attending: Nephrology | Admitting: Nephrology

## 2012-02-16 DIAGNOSIS — D638 Anemia in other chronic diseases classified elsewhere: Secondary | ICD-10-CM | POA: Insufficient documentation

## 2012-02-16 DIAGNOSIS — N185 Chronic kidney disease, stage 5: Secondary | ICD-10-CM | POA: Insufficient documentation

## 2012-02-16 LAB — IRON AND TIBC
Iron: 127 ug/dL (ref 42–135)
TIBC: 228 ug/dL — ABNORMAL LOW (ref 250–470)
UIBC: 101 ug/dL — ABNORMAL LOW (ref 125–400)

## 2012-02-16 LAB — FERRITIN: Ferritin: 177 ng/mL (ref 10–291)

## 2012-02-16 LAB — POCT HEMOGLOBIN-HEMACUE: Hemoglobin: 10.5 g/dL — ABNORMAL LOW (ref 12.0–15.0)

## 2012-02-16 MED ORDER — EPOETIN ALFA 20000 UNIT/ML IJ SOLN
INTRAMUSCULAR | Status: AC
  Filled 2012-02-16: qty 1

## 2012-02-16 MED ORDER — EPOETIN ALFA 20000 UNIT/ML IJ SOLN
20000.0000 [IU] | INTRAMUSCULAR | Status: DC
  Administered 2012-02-16: 20000 [IU] via SUBCUTANEOUS

## 2012-02-21 ENCOUNTER — Ambulatory Visit (INDEPENDENT_AMBULATORY_CARE_PROVIDER_SITE_OTHER): Payer: Medicare Other

## 2012-02-21 DIAGNOSIS — Z1231 Encounter for screening mammogram for malignant neoplasm of breast: Secondary | ICD-10-CM

## 2012-02-21 DIAGNOSIS — Z139 Encounter for screening, unspecified: Secondary | ICD-10-CM

## 2012-02-23 ENCOUNTER — Ambulatory Visit (INDEPENDENT_AMBULATORY_CARE_PROVIDER_SITE_OTHER): Payer: Medicare Other | Admitting: Internal Medicine

## 2012-02-23 ENCOUNTER — Encounter: Payer: Self-pay | Admitting: Internal Medicine

## 2012-02-23 VITALS — BP 130/74 | HR 72 | Wt 206.8 lb

## 2012-02-23 DIAGNOSIS — E039 Hypothyroidism, unspecified: Secondary | ICD-10-CM

## 2012-02-23 DIAGNOSIS — E8881 Metabolic syndrome: Secondary | ICD-10-CM

## 2012-02-23 DIAGNOSIS — Z79899 Other long term (current) drug therapy: Secondary | ICD-10-CM

## 2012-02-23 DIAGNOSIS — M109 Gout, unspecified: Secondary | ICD-10-CM

## 2012-02-23 DIAGNOSIS — E559 Vitamin D deficiency, unspecified: Secondary | ICD-10-CM

## 2012-02-23 DIAGNOSIS — I10 Essential (primary) hypertension: Secondary | ICD-10-CM

## 2012-02-23 DIAGNOSIS — E785 Hyperlipidemia, unspecified: Secondary | ICD-10-CM

## 2012-02-23 DIAGNOSIS — M899 Disorder of bone, unspecified: Secondary | ICD-10-CM

## 2012-02-23 DIAGNOSIS — Z860100 Personal history of colon polyps, unspecified: Secondary | ICD-10-CM

## 2012-02-23 DIAGNOSIS — Z0000 Encounter for general adult medical examination without abnormal findings: Secondary | ICD-10-CM

## 2012-02-23 LAB — LIPID PANEL: Total CHOL/HDL Ratio: 4

## 2012-02-23 LAB — AST: AST: 14 U/L (ref 0–37)

## 2012-02-23 LAB — URIC ACID: Uric Acid, Serum: 6.7 mg/dL (ref 2.4–7.0)

## 2012-02-23 LAB — HEMOGLOBIN A1C: Hgb A1c MFr Bld: 5.4 % (ref 4.6–6.5)

## 2012-02-23 NOTE — Progress Notes (Signed)
Subjective:    Patient ID: Tabitha Green, female    DOB: [birth year], 66 y.o.   MRN: [id number]  HPI Medicare Wellness Visit:  The following psychosocial & medical history were reviewed as required by Medicare.   Social history: caffeine: 1.5 P[employer] / day , alcohol:  no ,  tobacco use : never  & exercise : minimal.   Home & personal  safety / fall risk: no issues, activities of daily living: no limitations , seatbelt use : yes , and smoke alarm employment : yes .  Power of Attorney/Living Will status : NO  Vision ( as recorded per Nurse) & Hearing  evaluation :  Ophth exam 2010; no hearing evaluation. Orientation :oriented X 3 , memory & recall :good ,  math testing: good,and mood & affect : normal . Depression / anxiety: denied Travel history : [place] 2001 , immunization status : Shingles needed , transfusion history:  no, and preventive health surveillance ( colonoscopies, BMD , etc as per protocol/ [facility]): colonoscopy due, Dental care:  Seen annually . Chart reviewed &  Updated. Active issues reviewed & addressed.       Review of Systems HYPERTENSION: Disease Monitoring: Blood pressure range-not monitored  Chest pain, palpitations- no       Dyspnea- no Medications: Compliance- yes  Lightheadedness,Syncope- occasional lightheadedness    Edema- no  FASTING HYPERGLYCEMIA, PMH of:  Polyuria/phagia/dipsia- no       Visual problems- no Skin lesions: no Numbness & tingling: no  HYPERLIPIDEMIA: Disease Monitoring: See symptoms for Hypertension Medications: Compliance- no statin Abd pain, bowel changes- occasional constipation; stool dark on iron   Muscle aches- no           Objective:   Physical Exam Gen.: well-nourished in appearance. Alert, appropriate and cooperative throughout exam. Head: Normocephalic without obvious abnormalities Eyes: No corneal or conjunctival inflammation noted. Slight asymmetric ptosis Extraocular motion intact. Vision grossly normal w/o  lenses. Ears: External  ear exam reveals no significant lesions or deformities. Canals clear .TMs normal. Hearing is grossly normal bilaterally. Nose: External nasal exam reveals no deformity or inflammation. Nasal mucosa are pink and moist. No lesions or exudates noted.  Mouth: Oral mucosa and oropharynx reveal no lesions or exudates. Teeth in good repair. Neck: No deformities, masses, or tenderness noted. Range of motion & Thyroid normal. Lungs: Normal respiratory effort; chest expands symmetrically. Lungs are clear to auscultation without rales, wheezes, or increased work of breathing. Heart: Normal rate and rhythm. Normal S1 and S2. No gallop, click, or rub. S 4 w/o  murmur. Abdomen: Bowel sounds normal; abdomen soft and nontender. No masses, organomegaly or hernias noted. Genitalia: S/P TAH & BSO                                                                          Musculoskeletal/extremities: Lordosis noted of  the thoracic  spine. No clubbing, cyanosis, edema, or deformity noted. Range of motion  normal .Tone & strength  normal.Joints normal. Nail health  good. Vascular: Carotid, radial artery, dorsalis pedis and  posterior tibial pulses are full and equal. No bruits present.AV fistula RUE with bruit Neurologic: Alert and oriented x3. Deep tendon reflexes symmetrical and normal.  Skin: Intact without suspicious lesions or rashes. Lymph: No cervical, axillary lymphadenopathy present. Psych: Mood and affect are normal. Normally interactive                                                                                         Assessment & Plan:  #1 Medicare Wellness Exam; criteria met ; data entered #2 Problem List reviewed ; Assessment/ Recommendations made Plan: see Orders

## 2012-02-23 NOTE — Patient Instructions (Addendum)
Preventive Health Care: Exercise  30-45  minutes a day, 3-4 days a week. Walking is especially valuable in preventing Osteoporosis. Eat a low-fat diet with lots of fruits and vegetables, up to 7-9 servings per day.  Consume less than 30 grams of sugar per day from foods & drinks with High Fructose Corn Syrup as # 1,2,3 or #4 on label. As per the Standard of Care , screening Colonoscopy recommended @ 50 & every 5-10 years thereafter . More frequent monitor would be dictated by family history or findings @ Colonoscopy   [place] Will place you in charge of your health care  decisions. Verify these are  in place. Blood Pressure Goal  Ideally is an AVERAGE < 135/85. This AVERAGE should be calculated from @ least 5-7 BP readings taken @ different times of day on different days of week. You should not respond to isolated BP readings , but rather the AVERAGE for that week . Please try to go on My Chart within the next 24 hours to allow me to release the results directly to you.

## 2012-02-24 LAB — VITAMIN D 25 HYDROXY (VIT D DEFICIENCY, FRACTURES): Vit D, 25-Hydroxy: 18 ng/mL — ABNORMAL LOW (ref 30–89)

## 2012-03-15 ENCOUNTER — Encounter (HOSPITAL_COMMUNITY)
Admission: RE | Admit: 2012-03-15 | Discharge: 2012-03-15 | Disposition: A | Discharge status: Home / Self Care | Payer: Medicare Other | Source: Ambulatory Visit | Attending: Nephrology | Admitting: Nephrology

## 2012-03-15 DIAGNOSIS — N185 Chronic kidney disease, stage 5: Secondary | ICD-10-CM | POA: Insufficient documentation

## 2012-03-15 DIAGNOSIS — D638 Anemia in other chronic diseases classified elsewhere: Secondary | ICD-10-CM | POA: Insufficient documentation

## 2012-03-15 MED ORDER — EPOETIN ALFA 20000 UNIT/ML IJ SOLN
INTRAMUSCULAR | Status: AC
  Filled 2012-03-15: qty 1

## 2012-03-15 MED ORDER — EPOETIN ALFA 20000 UNIT/ML IJ SOLN
20000.0000 [IU] | INTRAMUSCULAR | Status: DC
  Administered 2012-03-15: 20000 [IU] via SUBCUTANEOUS

## 2012-04-12 ENCOUNTER — Encounter (HOSPITAL_COMMUNITY)
Admission: RE | Admit: 2012-04-12 | Discharge: 2012-04-12 | Disposition: A | Discharge status: Home / Self Care | Payer: Medicare Other | Source: Ambulatory Visit | Attending: Nephrology | Admitting: Nephrology

## 2012-04-12 DIAGNOSIS — N185 Chronic kidney disease, stage 5: Secondary | ICD-10-CM | POA: Insufficient documentation

## 2012-04-12 DIAGNOSIS — D638 Anemia in other chronic diseases classified elsewhere: Secondary | ICD-10-CM | POA: Insufficient documentation

## 2012-04-12 LAB — FERRITIN: Ferritin: 169 ng/mL (ref 10–291)

## 2012-04-12 LAB — POCT HEMOGLOBIN-HEMACUE: Hemoglobin: 11.4 g/dL — ABNORMAL LOW (ref 12.0–15.0)

## 2012-04-12 MED ORDER — EPOETIN ALFA 20000 UNIT/ML IJ SOLN
20000.0000 [IU] | INTRAMUSCULAR | Status: DC
  Administered 2012-04-12: 20000 [IU] via SUBCUTANEOUS

## 2012-04-12 MED ORDER — EPOETIN ALFA 20000 UNIT/ML IJ SOLN
INTRAMUSCULAR | Status: AC
  Filled 2012-04-12: qty 1

## 2012-04-17 ENCOUNTER — Other Ambulatory Visit: Payer: Self-pay | Admitting: Internal Medicine

## 2012-05-03 ENCOUNTER — Encounter: Payer: Self-pay | Admitting: Internal Medicine

## 2012-05-07 ENCOUNTER — Encounter: Payer: Self-pay | Admitting: Internal Medicine

## 2012-05-10 ENCOUNTER — Encounter (HOSPITAL_COMMUNITY)
Admission: RE | Admit: 2012-05-10 | Discharge: 2012-05-10 | Disposition: A | Discharge status: Home / Self Care | Payer: Medicare Other | Source: Ambulatory Visit | Attending: Nephrology | Admitting: Nephrology

## 2012-05-10 DIAGNOSIS — D638 Anemia in other chronic diseases classified elsewhere: Secondary | ICD-10-CM | POA: Insufficient documentation

## 2012-05-10 DIAGNOSIS — N185 Chronic kidney disease, stage 5: Secondary | ICD-10-CM | POA: Insufficient documentation

## 2012-05-10 LAB — IRON AND TIBC
Iron: 100 ug/dL (ref 42–135)
Saturation Ratios: 45 % (ref 20–55)
TIBC: 221 ug/dL — ABNORMAL LOW (ref 250–470)
UIBC: 121 ug/dL — ABNORMAL LOW (ref 125–400)

## 2012-05-10 MED ORDER — EPOETIN ALFA 20000 UNIT/ML IJ SOLN
20000.0000 [IU] | INTRAMUSCULAR | Status: DC
  Administered 2012-05-10: 20000 [IU] via SUBCUTANEOUS

## 2012-05-25 ENCOUNTER — Ambulatory Visit (AMBULATORY_SURGERY_CENTER): Payer: Medicare Other | Admitting: *Deleted

## 2012-05-25 VITALS — Ht 66.5 in | Wt 208.1 lb

## 2012-05-25 DIAGNOSIS — Z1211 Encounter for screening for malignant neoplasm of colon: Secondary | ICD-10-CM

## 2012-05-25 DIAGNOSIS — Z86010 Personal history of colonic polyps: Secondary | ICD-10-CM

## 2012-05-25 MED ORDER — PEG-KCL-NACL-NASULF-NA ASC-C 100 G PO SOLR: ORAL | Status: DC

## 2012-05-25 NOTE — Progress Notes (Signed)
No allergies to eggs or soy products 

## 2012-05-26 ENCOUNTER — Other Ambulatory Visit: Payer: Self-pay | Admitting: Internal Medicine

## 2012-06-04 ENCOUNTER — Other Ambulatory Visit: Payer: Self-pay | Admitting: Internal Medicine

## 2012-06-05 NOTE — Telephone Encounter (Signed)
TSH 244.9 

## 2012-06-06 ENCOUNTER — Encounter (HOSPITAL_COMMUNITY)
Admission: RE | Admit: 2012-06-06 | Discharge: 2012-06-06 | Disposition: A | Discharge status: Home / Self Care | Payer: Medicare Other | Source: Ambulatory Visit | Attending: Nephrology | Admitting: Nephrology

## 2012-06-06 LAB — IRON AND TIBC
Iron: 126 ug/dL (ref 42–135)
TIBC: 245 ug/dL — ABNORMAL LOW (ref 250–470)
UIBC: 119 ug/dL — ABNORMAL LOW (ref 125–400)

## 2012-06-06 LAB — FERRITIN: Ferritin: 138 ng/mL (ref 10–291)

## 2012-06-06 MED ORDER — EPOETIN ALFA 20000 UNIT/ML IJ SOLN
INTRAMUSCULAR | Status: AC
  Filled 2012-06-06: qty 1

## 2012-06-06 MED ORDER — EPOETIN ALFA 20000 UNIT/ML IJ SOLN
20000.0000 [IU] | INTRAMUSCULAR | Status: DC
  Administered 2012-06-06: 20000 [IU] via SUBCUTANEOUS

## 2012-06-07 ENCOUNTER — Encounter: Payer: Self-pay | Admitting: Internal Medicine

## 2012-06-07 ENCOUNTER — Ambulatory Visit (AMBULATORY_SURGERY_CENTER): Payer: Medicare Other | Admitting: Internal Medicine

## 2012-06-07 VITALS — BP 159/79 | HR 49 | Temp 97.3°F | Resp 17 | Ht 66.0 in | Wt 208.0 lb

## 2012-06-07 DIAGNOSIS — Z86010 Personal history of colonic polyps: Secondary | ICD-10-CM

## 2012-06-07 DIAGNOSIS — K5730 Diverticulosis of large intestine without perforation or abscess without bleeding: Secondary | ICD-10-CM

## 2012-06-07 DIAGNOSIS — Z1211 Encounter for screening for malignant neoplasm of colon: Secondary | ICD-10-CM

## 2012-06-07 MED ORDER — SODIUM CHLORIDE 0.9 % IV SOLN: 500.0000 mL | INTRAVENOUS | Status: DC

## 2012-06-07 NOTE — Progress Notes (Signed)
Patient did not experience any of the following events: a burn prior to discharge; a fall within the facility; wrong site/side/patient/procedure/implant event; or a hospital transfer or hospital admission upon discharge from the facility. (G8907) Patient did not have preoperative order for IV antibiotic SSI prophylaxis. (G8918)  

## 2012-06-07 NOTE — Op Note (Signed)
[place]  [address] [place], [phone]   COLONOSCOPY PROCEDURE REPORT  PATIENT: Tabitha Green, Tabitha Green  MR#: [id number] BIRTHDATE: [birth year] , 66  yrs. old GENDER: Female ENDOSCOPIST: Eustace Quail, MD REFERRED [id number] Program Recall PROCEDURE DATE:  06/07/2012 PROCEDURE:   Colonoscopy, surveillance ASA CLASS:   Class III INDICATIONS:patient's personal history of colon polyps. MEDICATIONS: MAC sedation, administered by CRNA and propofol (Diprivan) 240mg  IV  DESCRIPTION OF PROCEDURE:   After the risks benefits and alternatives of the procedure were thoroughly explained, informed consent was obtained.  A digital rectal exam revealed no abnormalities of the rectum.   The LB [id number]  endoscope was introduced through the anus and advanced to the cecum, which was identified by both the appendix and ileocecal valve. No adverse events experienced.   The quality of the prep was Moviprep fair but upgraded to adequate with vigorous irrigation and suctioning. The instrument was then slowly withdrawn as the colon was fully examined.      COLON FINDINGS: Severe diverticulosis was noted throughout the entire examined colon.   The colon mucosa was otherwise normal. Retroflexed views revealed internal hemorrhoids. The time to cecum=4 minutes 55 seconds.  Withdrawal time=9 minutes 02 seconds. The scope was withdrawn and the procedure completed. COMPLICATIONS: There were no complications.  ENDOSCOPIC IMPRESSION: 1.   Severe diverticulosis was noted throughout the entire examined colon 2.   The colon mucosa was otherwise normal  RECOMMENDATIONS: 1. Return to the care of your primary provider.  GI follow up as needed   eSigned:  Eustace Quail, MD 06/07/2012 12:25 PM   cc: Hendricks Limes, MD and The Patient

## 2012-06-07 NOTE — Patient Instructions (Signed)
YOU HAD AN ENDOSCOPIC PROCEDURE TODAY AT THE [facility]: Refer to the procedure report that was given to you for any specific questions about what was found during the examination.  If the procedure report does not answer your questions, please call your gastroenterologist to clarify.  If you requested that your care partner not be given the details of your procedure findings, then the procedure report has been included in a sealed envelope for you to review at your convenience later.  YOU SHOULD EXPECT: Some feelings of bloating in the abdomen. Passage of more gas than usual.  Walking can help get rid of the air that was put into your GI tract during the procedure and reduce the bloating. If you had a lower endoscopy (such as a colonoscopy or flexible sigmoidoscopy) you may notice spotting of blood in your stool or on the toilet paper. If you underwent a bowel prep for your procedure, then you may not have a normal bowel movement for a few days.  DIET: Your first meal following the procedure should be a light meal and then it is ok to progress to your normal diet.  A half-sandwich or bowl of soup is an example of a good first meal.  Heavy or fried foods are harder to digest and may make you feel nauseous or bloated.  Likewise meals heavy in dairy and vegetables can cause extra gas to form and this can also increase the bloating.  Drink plenty of fluids but you should avoid alcoholic beverages for 24 hours.  ACTIVITY: Your care partner should take you home directly after the procedure.  You should plan to take it easy, moving slowly for the rest of the day.  You can resume normal activity the day after the procedure however you should NOT DRIVE or use heavy machinery for 24 hours (because of the sedation medicines used during the test).    SYMPTOMS TO REPORT IMMEDIATELY: A gastroenterologist can be reached at any hour.  During normal business hours, 8:30 AM to 5:00 PM Monday through Friday,  call [phone].  After hours and on weekends, please call the GI answering service at [phone] who will take a message and have the physician on call contact you.   Following lower endoscopy (colonoscopy or flexible sigmoidoscopy):  Excessive amounts of blood in the stool  Significant tenderness or worsening of abdominal pains  Swelling of the abdomen that is new, acute  Fever of 100F or higher    FOLLOW UP: If any biopsies were taken you will be contacted by phone or by letter within the next 1-3 weeks.  Call your gastroenterologist if you have not heard about the biopsies in 3 weeks.  Our staff will call the home number listed on your records the next business day following your procedure to check on you and address any questions or concerns that you may have at that time regarding the information given to you following your procedure. This is a courtesy call and so if there is no answer at the home number and we have not heard from you through the emergency physician on call, we will assume that you have returned to your regular daily activities without incident.  SIGNATURES/CONFIDENTIALITY: You and/or your care partner have signed paperwork which will be entered into your electronic medical record.  These signatures attest to the fact that that the information above on your After Visit Summary has been reviewed and is understood.  Full responsibility of the confidentiality   of this discharge information lies with you and/or your care-partner.     Information on diverticulosis & high fiber diet given to you today

## 2012-06-08 ENCOUNTER — Telehealth: Payer: Self-pay | Admitting: *Deleted

## 2012-06-08 NOTE — Telephone Encounter (Signed)
  Follow up Call-  Call back number 06/07/2012  Post procedure Call Back phone  # [phone]  Permission to leave phone message Yes     Patient questions:  Do you have a fever, pain , or abdominal swelling? no Pain Score  0 *  Have you tolerated food without any problems? yes  Have you been able to return to your normal activities? yes  Do you have any questions about your discharge instructions: Diet   no Medications  no Follow up visit  no  Do you have questions or concerns about your Care? no  Actions: * If pain score is 4 or above: No action needed, pain <4.

## 2012-06-20 ENCOUNTER — Other Ambulatory Visit: Payer: Self-pay | Admitting: Internal Medicine

## 2012-06-20 NOTE — Telephone Encounter (Signed)
Rx sent.    MW 

## 2012-06-22 ENCOUNTER — Other Ambulatory Visit: Payer: Self-pay | Admitting: Internal Medicine

## 2012-07-04 ENCOUNTER — Encounter (HOSPITAL_COMMUNITY)
Admission: RE | Admit: 2012-07-04 | Discharge: 2012-07-04 | Disposition: A | Discharge status: Home / Self Care | Payer: Medicare Other | Source: Ambulatory Visit | Attending: Nephrology | Admitting: Nephrology

## 2012-07-04 DIAGNOSIS — D638 Anemia in other chronic diseases classified elsewhere: Secondary | ICD-10-CM | POA: Insufficient documentation

## 2012-07-04 DIAGNOSIS — N185 Chronic kidney disease, stage 5: Secondary | ICD-10-CM | POA: Insufficient documentation

## 2012-07-04 LAB — IRON AND TIBC
Saturation Ratios: 41 % (ref 20–55)
TIBC: 258 ug/dL (ref 250–470)

## 2012-07-04 LAB — FERRITIN: Ferritin: 169 ng/mL (ref 10–291)

## 2012-07-04 LAB — POCT HEMOGLOBIN-HEMACUE: Hemoglobin: 11.3 g/dL — ABNORMAL LOW (ref 12.0–15.0)

## 2012-07-04 MED ORDER — EPOETIN ALFA 20000 UNIT/ML IJ SOLN
INTRAMUSCULAR | Status: AC
  Filled 2012-07-04: qty 1

## 2012-07-04 MED ORDER — EPOETIN ALFA 20000 UNIT/ML IJ SOLN
20000.0000 [IU] | INTRAMUSCULAR | Status: DC
  Administered 2012-07-04: 20000 [IU] via SUBCUTANEOUS

## 2012-07-09 ENCOUNTER — Encounter: Payer: Self-pay | Admitting: *Deleted

## 2012-07-09 ENCOUNTER — Encounter: Payer: Self-pay | Admitting: Internal Medicine

## 2012-07-09 ENCOUNTER — Emergency Department (INDEPENDENT_AMBULATORY_CARE_PROVIDER_SITE_OTHER)
Admission: EM | Admit: 2012-07-09 | Discharge: 2012-07-09 | Disposition: A | Discharge status: Home / Self Care | Payer: Medicare Other | Source: Home / Self Care | Attending: Family Medicine | Admitting: Family Medicine

## 2012-07-09 DIAGNOSIS — J069 Acute upper respiratory infection, unspecified: Secondary | ICD-10-CM

## 2012-07-09 MED ORDER — BENZONATATE 200 MG PO CAPS: 200.0000 mg | ORAL_CAPSULE | Freq: Every day | ORAL | Status: DC

## 2012-07-09 MED ORDER — AMOXICILLIN 875 MG PO TABS: 875.0000 mg | ORAL_TABLET | Freq: Two times a day (BID) | ORAL | Status: DC

## 2012-07-09 NOTE — ED Notes (Signed)
Patient c/o cough, sinus problems, and runny nose x 6 days. Patient is going out of town Thursday and wants to take medication if necessary.

## 2012-07-09 NOTE — ED Provider Notes (Signed)
History     CSN: [id number]  Arrival date & time 07/09/12  1623   First MD Initiated Contact with Patient 07/09/12 1635      Chief Complaint  Patient presents with  . Sinus Problem  . Cough      HPI Comments: Patient complains of approximately 4 day history of gradually progressive URI symptoms beginning with a mild sore throat (now improved), followed by progressive nasal congestion and a cough. Complains of fatigue but no myalgias.  Cough is now worse at night and generally non-productive during the day.  There has been no pleuritic pain, shortness of breath, or wheezes.   She will be flying to [place] later this week.  The history is provided by the patient.    Past Medical History  Diagnosis Date  . Hypertension   . Hypothyroidism   . Gout   . Arthritis   . Anemia     r/t kidney function- receives Procrit  . Cancer     kidney  . FSGS (focal segmental glomerulosclerosis)     right kidney;  . Osteopenia     Past Surgical History  Procedure Date  . Total abdominal hysterectomy w/ bilateral salpingoophorectomy     fibroids  . Nephrectomy 1993    for malignancy- left  . Colonoscopy with polypectomy     Dr Henrene Pastor  . Av fistula placement     pt has not had to start dialysis  . Renal biopsy     right  . Colonoscopy     Family History  Problem Relation Age of Onset  . Deep vein thrombosis Mother     post thyroid surgery  . Heart attack Father 40  . Cancer Sister     breast  . Cancer Paternal Aunt     pancreatic  . Diabetes Maternal Grandfather   . Stroke Paternal Grandmother     in 24s  . Colon cancer Neg Hx   . Esophageal cancer Neg Hx   . Stomach cancer Neg Hx   . Rectal cancer Neg Hx     History  Substance Use Topics  . Smoking status: Never Smoker   . Smokeless tobacco: Never Used  . Alcohol Use: Yes     Comment: rarely    OB History    Grav Para Term Preterm Abortions TAB SAB Ect Mult Living                  Review of Systems + sore  throat + cough No pleuritic pain No wheezing + nasal congestion + post-nasal drainage No sinus pain/pressure No itchy/red eyes No earache No hemoptysis No SOB No fever, + chills No nausea No vomiting No abdominal pain No diarrhea No urinary symptoms No skin rashes + fatigue No myalgias + headache Used OTC meds without relief  Allergies  Cefaclor; Naproxen; Metoprolol tartrate; and Enalapril maleate  Home Medications   Current Outpatient Rx  Name  Route  Sig  Dispense  Refill  . TYLENOL PO   Oral   Take by mouth as needed.         . ALLOPURINOL 100 MG PO TABS      TAKE ONE TABLET BY MOUTH DAILY   30 tablet   5   . AMOXICILLIN 875 MG PO TABS   Oral   Take 1 tablet (875 mg total) by mouth 2 (two) times daily.   20 tablet   0   . BENZONATATE 200 MG PO CAPS  Oral   Take 1 capsule (200 mg total) by mouth at bedtime. Take as needed for cough   12 capsule   0   . OS-CAL PO   Oral   Take 1 tablet by mouth 3 (three) times daily.         [name] CLONIDINE HCL 0.2 MG PO TABS      take 1 tablet by mouth twice a day   60 tablet   5   . EPOETIN ALFA 2000 UNIT/ML IJ SOLN   Subcutaneous   Inject 2,000 Units into the skin every 30 (thirty) days. At [place] [facility]         . FERROUS FUMARATE 325 (106 FE) MG PO TABS   Oral   Take 1 tablet by mouth 2 (two) times daily.         . FUROSEMIDE 80 MG PO TABS      take 1 tablet by mouth once daily   30 tablet   5   . LEVOTHYROXINE SODIUM 175 MCG PO TABS      take 1 tablet by mouth daily EXCEPT WEDNESDAY 1 AND 1/2 TABLET   34 tablet   0     **LABS OVER-DUE**   . LEVOTHYROXINE SODIUM 175 MCG PO TABS      take 1 tablet by mouth daily EXCEPT WEDNESDAY 1 AND 1/2 TABLET   32 tablet   0     **LABS DUE**   . LOSARTAN POTASSIUM 100 MG PO TABS      TAKE ONE TABLET BY MOUTH DAILY   90 tablet   1   . VERAPAMIL HCL ER 240 MG PO TBCR      take 1 tablet by mouth twice a day   60 tablet   5      BP 161/90  Pulse 68  Temp 97.8 F (36.6 C) (Oral)  Resp 16  Ht 5' 6.5" (1.689 m)  Wt 204 lb 12 oz (92.874 kg)  BMI 32.55 kg/m2  SpO2 98%  Physical Exam Nursing notes and Vital Signs reviewed. Appearance:  Patient appears stated age, and in no acute distress.  Patient is obese (BMI 32.6) Eyes:  Pupils are equal, round, and reactive to light and accomodation.  Extraocular movement is intact.  Conjunctivae are not inflamed  Ears:  Canals normal.  Tympanic membranes normal.  Nose:  Mildly congested turbinates.  No sinus tenderness.   Pharynx:  Normal Neck:  Supple.  Slightly tender shotty posterior nodes are palpated bilaterally  Lungs:  Clear to auscultation.  Breath sounds are equal.  Heart:  Regular rate and rhythm without murmurs, rubs, or gallops.  Abdomen:  Nontender without masses or hepatosplenomegaly.  Bowel sounds are present.  No CVA or flank tenderness.  Extremities:  No edema.  No calf tenderness Skin:  No rash present.   ED Course  Procedures none      1. Acute upper respiratory infections of unspecified site       MDM  Begin amoxicillin.  Prescription written for Benzonatate [facility]) to take at bedtime for night-time cough.  Take plain Mucinex (guaifenesin) twice daily for cough and congestion.  Increase fluid intake, rest. May use Afrin nasal spray (or generic oxymetazoline) twice daily for about 5 days.  Also recommend using saline nasal spray several times daily and saline nasal irrigation (AYR is a common brand) Stop all antihistamines for now, and other non-prescription cough/cold preparations. Follow-up with family doctor if not improving 7 to 10  days.         Kandra Nicolas, MD 07/10/12 [phone]

## 2012-07-14 ENCOUNTER — Telehealth: Payer: Self-pay | Admitting: Emergency Medicine

## 2012-07-24 ENCOUNTER — Other Ambulatory Visit: Payer: Self-pay | Admitting: Internal Medicine

## 2012-07-24 NOTE — Telephone Encounter (Signed)
TSH 244.9 

## 2012-08-02 ENCOUNTER — Encounter (HOSPITAL_COMMUNITY)
Admission: RE | Admit: 2012-08-02 | Discharge: 2012-08-02 | Disposition: A | Discharge status: Home / Self Care | Payer: Medicare Other | Source: Ambulatory Visit | Attending: Nephrology | Admitting: Nephrology

## 2012-08-02 DIAGNOSIS — N185 Chronic kidney disease, stage 5: Secondary | ICD-10-CM | POA: Insufficient documentation

## 2012-08-02 DIAGNOSIS — D638 Anemia in other chronic diseases classified elsewhere: Secondary | ICD-10-CM | POA: Insufficient documentation

## 2012-08-02 LAB — IRON AND TIBC
Iron: 118 ug/dL (ref 42–135)
Saturation Ratios: 49 % (ref 20–55)
TIBC: 242 ug/dL — ABNORMAL LOW (ref 250–470)

## 2012-08-02 LAB — POCT HEMOGLOBIN-HEMACUE: Hemoglobin: 10.4 g/dL — ABNORMAL LOW (ref 12.0–15.0)

## 2012-08-02 MED ORDER — EPOETIN ALFA 20000 UNIT/ML IJ SOLN
INTRAMUSCULAR | Status: AC
  Filled 2012-08-02: qty 1

## 2012-08-02 MED ORDER — EPOETIN ALFA 20000 UNIT/ML IJ SOLN
20000.0000 [IU] | INTRAMUSCULAR | Status: DC
  Administered 2012-08-02: 20000 [IU] via SUBCUTANEOUS

## 2012-08-29 ENCOUNTER — Other Ambulatory Visit (HOSPITAL_COMMUNITY): Payer: Self-pay | Admitting: *Deleted

## 2012-08-30 ENCOUNTER — Encounter (HOSPITAL_COMMUNITY)
Admission: RE | Admit: 2012-08-30 | Discharge: 2012-08-30 | Disposition: A | Discharge status: Home / Self Care | Payer: Medicare Other | Source: Ambulatory Visit | Attending: Nephrology | Admitting: Nephrology

## 2012-08-30 DIAGNOSIS — N185 Chronic kidney disease, stage 5: Secondary | ICD-10-CM | POA: Insufficient documentation

## 2012-08-30 DIAGNOSIS — D638 Anemia in other chronic diseases classified elsewhere: Secondary | ICD-10-CM | POA: Insufficient documentation

## 2012-08-30 LAB — PHOSPHORUS: Phosphorus: 4.7 mg/dL — ABNORMAL HIGH (ref 2.3–4.6)

## 2012-08-30 LAB — CBC
MCV: 93.3 fL (ref 78.0–100.0)
Platelets: 268 10*3/uL (ref 150–400)
RBC: 3.57 MIL/uL — ABNORMAL LOW (ref 3.87–5.11)
RDW: 13.6 % (ref 11.5–15.5)
WBC: 8.7 10*3/uL (ref 4.0–10.5)

## 2012-08-30 LAB — COMPREHENSIVE METABOLIC PANEL
AST: 14 U/L (ref 0–37)
BUN: 67 mg/dL — ABNORMAL HIGH (ref 6–23)
CO2: 20 mEq/L (ref 19–32)
Calcium: 8.8 mg/dL (ref 8.4–10.5)
Chloride: 104 mEq/L (ref 96–112)
Creatinine, Ser: 7.44 mg/dL — ABNORMAL HIGH (ref 0.50–1.10)
GFR calc Af Amer: 6 mL/min — ABNORMAL LOW (ref >90)
GFR calc non Af Amer: 5 mL/min — ABNORMAL LOW (ref >90)
Glucose, Bld: 95 mg/dL (ref 70–99)
Total Bilirubin: 0.3 mg/dL (ref 0.3–1.2)

## 2012-08-30 MED ORDER — EPOETIN ALFA 20000 UNIT/ML IJ SOLN
20000.0000 [IU] | INTRAMUSCULAR | Status: DC
Start: 2012-08-30 — End: 2012-08-31
  Administered 2012-08-30: 20000 [IU] via SUBCUTANEOUS

## 2012-08-30 MED ORDER — EPOETIN ALFA 20000 UNIT/ML IJ SOLN
INTRAMUSCULAR | Status: AC
  Administered 2012-08-30: 20000 [IU] via SUBCUTANEOUS
  Filled 2012-08-30: qty 1

## 2012-08-31 LAB — PTH, INTACT AND CALCIUM
Calcium, Total (PTH): 8.4 mg/dL (ref 8.4–10.5)
PTH: 647.2 pg/mL — ABNORMAL HIGH (ref 14.0–72.0)

## 2012-09-17 ENCOUNTER — Telehealth: Payer: Self-pay | Admitting: Internal Medicine

## 2012-09-17 DIAGNOSIS — E039 Hypothyroidism, unspecified: Secondary | ICD-10-CM

## 2012-09-17 MED ORDER — LEVOTHYROXINE SODIUM 175 MCG PO TABS: ORAL_TABLET | ORAL | Status: DC

## 2012-09-17 NOTE — Telephone Encounter (Signed)
Refill: Levothyroxine 175 mcg tablet. Take 1 tablet by mouth daily excepy 1 and 1/2 on Wednesday. Qty 33. Last fill 07-24-12

## 2012-09-22 ENCOUNTER — Other Ambulatory Visit: Payer: Self-pay

## 2012-09-27 ENCOUNTER — Encounter (HOSPITAL_COMMUNITY)
Admission: RE | Admit: 2012-09-27 | Discharge: 2012-09-27 | Disposition: A | Discharge status: Home / Self Care | Payer: Medicare Other | Source: Ambulatory Visit | Attending: Nephrology | Admitting: Nephrology

## 2012-09-27 DIAGNOSIS — N185 Chronic kidney disease, stage 5: Secondary | ICD-10-CM | POA: Insufficient documentation

## 2012-09-27 DIAGNOSIS — D638 Anemia in other chronic diseases classified elsewhere: Secondary | ICD-10-CM | POA: Insufficient documentation

## 2012-09-27 LAB — POCT HEMOGLOBIN-HEMACUE: Hemoglobin: 11.5 g/dL — ABNORMAL LOW (ref 12.0–15.0)

## 2012-09-27 MED ORDER — EPOETIN ALFA 20000 UNIT/ML IJ SOLN
20000.0000 [IU] | INTRAMUSCULAR | Status: DC
  Administered 2012-09-27: 20000 [IU] via SUBCUTANEOUS

## 2012-09-27 MED ORDER — EPOETIN ALFA 20000 UNIT/ML IJ SOLN
INTRAMUSCULAR | Status: AC
  Filled 2012-09-27: qty 1

## 2012-10-11 ENCOUNTER — Other Ambulatory Visit: Payer: Self-pay | Admitting: Internal Medicine

## 2012-10-25 ENCOUNTER — Encounter (HOSPITAL_COMMUNITY)
Admission: RE | Admit: 2012-10-25 | Discharge: 2012-10-25 | Disposition: A | Discharge status: Home / Self Care | Payer: Medicare Other | Source: Ambulatory Visit | Attending: Nephrology | Admitting: Nephrology

## 2012-10-25 DIAGNOSIS — D638 Anemia in other chronic diseases classified elsewhere: Secondary | ICD-10-CM | POA: Insufficient documentation

## 2012-10-25 DIAGNOSIS — N185 Chronic kidney disease, stage 5: Secondary | ICD-10-CM | POA: Insufficient documentation

## 2012-10-25 LAB — POCT HEMOGLOBIN-HEMACUE: Hemoglobin: 10.4 g/dL — ABNORMAL LOW (ref 12.0–15.0)

## 2012-10-25 MED ORDER — EPOETIN ALFA 20000 UNIT/ML IJ SOLN: 20000.0000 [IU] | INTRAMUSCULAR | Status: DC

## 2012-10-25 MED ORDER — EPOETIN ALFA 20000 UNIT/ML IJ SOLN
INTRAMUSCULAR | Status: AC
  Administered 2012-10-25: 20000 [IU] via SUBCUTANEOUS
  Filled 2012-10-25: qty 1

## 2012-11-08 ENCOUNTER — Telehealth: Payer: Self-pay | Admitting: Internal Medicine

## 2012-11-08 MED ORDER — LEVOTHYROXINE SODIUM 175 MCG PO TABS: ORAL_TABLET | ORAL | Status: DC

## 2012-11-08 NOTE — Telephone Encounter (Signed)
Refill: Levothyroxine 175 mcg tablet. Take 1 tablet by mouth daily except 1 and 1/2 on Wednesday 1 tablet by mouth once daily. Qty 33. Last fill 09-17-12

## 2012-11-08 NOTE — Telephone Encounter (Signed)
RX sent electronically 

## 2012-11-21 ENCOUNTER — Other Ambulatory Visit (HOSPITAL_COMMUNITY): Payer: Self-pay | Admitting: *Deleted

## 2012-11-22 ENCOUNTER — Encounter (HOSPITAL_COMMUNITY)
Admission: RE | Admit: 2012-11-22 | Discharge: 2012-11-22 | Disposition: A | Discharge status: Home / Self Care | Payer: Medicare Other | Source: Ambulatory Visit | Attending: Nephrology | Admitting: Nephrology

## 2012-11-22 DIAGNOSIS — N185 Chronic kidney disease, stage 5: Secondary | ICD-10-CM | POA: Insufficient documentation

## 2012-11-22 DIAGNOSIS — D638 Anemia in other chronic diseases classified elsewhere: Secondary | ICD-10-CM | POA: Insufficient documentation

## 2012-11-22 LAB — COMPREHENSIVE METABOLIC PANEL
ALT: 9 U/L (ref 0–35)
AST: 16 U/L (ref 0–37)
CO2: 24 mEq/L (ref 19–32)
Calcium: 9.2 mg/dL (ref 8.4–10.5)
Chloride: 101 mEq/L (ref 96–112)
GFR calc non Af Amer: 4 mL/min — ABNORMAL LOW (ref >90)
Sodium: 137 mEq/L (ref 135–145)

## 2012-11-22 LAB — CBC
MCH: 31.7 pg (ref 26.0–34.0)
MCV: 90.1 fL (ref 78.0–100.0)
Platelets: 290 10*3/uL (ref 150–400)
RBC: 3.72 MIL/uL — ABNORMAL LOW (ref 3.87–5.11)

## 2012-11-22 MED ORDER — EPOETIN ALFA 20000 UNIT/ML IJ SOLN
INTRAMUSCULAR | Status: AC
  Administered 2012-11-22: 20000 [IU] via SUBCUTANEOUS
  Filled 2012-11-22: qty 1

## 2012-11-22 MED ORDER — EPOETIN ALFA 20000 UNIT/ML IJ SOLN: 20000.0000 [IU] | INTRAMUSCULAR | Status: DC

## 2012-11-23 ENCOUNTER — Other Ambulatory Visit: Payer: Self-pay | Admitting: Internal Medicine

## 2012-11-23 NOTE — Telephone Encounter (Signed)
Refill done.  

## 2012-12-07 ENCOUNTER — Inpatient Hospital Stay (HOSPITAL_COMMUNITY)
Admission: AD | Admit: 2012-12-07 | Discharge: 2012-12-09 | DRG: 309 | Disposition: A | Discharge status: Home / Self Care | Payer: Medicare Other | Source: Other Acute Inpatient Hospital | Attending: Cardiology | Admitting: Cardiology

## 2012-12-07 ENCOUNTER — Inpatient Hospital Stay (HOSPITAL_COMMUNITY): Payer: Medicare Other

## 2012-12-07 ENCOUNTER — Encounter: Payer: Self-pay | Admitting: Nurse Practitioner

## 2012-12-07 DIAGNOSIS — Z800 Family history of malignant neoplasm of digestive organs: Secondary | ICD-10-CM

## 2012-12-07 DIAGNOSIS — M109 Gout, unspecified: Secondary | ICD-10-CM | POA: Diagnosis present

## 2012-12-07 DIAGNOSIS — E039 Hypothyroidism, unspecified: Secondary | ICD-10-CM

## 2012-12-07 DIAGNOSIS — Z9119 Patient's noncompliance with other medical treatment and regimen: Secondary | ICD-10-CM

## 2012-12-07 DIAGNOSIS — N184 Chronic kidney disease, stage 4 (severe): Secondary | ICD-10-CM

## 2012-12-07 DIAGNOSIS — Z823 Family history of stroke: Secondary | ICD-10-CM

## 2012-12-07 DIAGNOSIS — D631 Anemia in chronic kidney disease: Secondary | ICD-10-CM | POA: Diagnosis present

## 2012-12-07 DIAGNOSIS — Z888 Allergy status to other drugs, medicaments and biological substances status: Secondary | ICD-10-CM

## 2012-12-07 DIAGNOSIS — E669 Obesity, unspecified: Secondary | ICD-10-CM | POA: Diagnosis present

## 2012-12-07 DIAGNOSIS — M1990 Unspecified osteoarthritis, unspecified site: Secondary | ICD-10-CM | POA: Diagnosis present

## 2012-12-07 DIAGNOSIS — E785 Hyperlipidemia, unspecified: Secondary | ICD-10-CM

## 2012-12-07 DIAGNOSIS — R001 Bradycardia, unspecified: Secondary | ICD-10-CM

## 2012-12-07 DIAGNOSIS — Z79899 Other long term (current) drug therapy: Secondary | ICD-10-CM

## 2012-12-07 DIAGNOSIS — Z85528 Personal history of other malignant neoplasm of kidney: Secondary | ICD-10-CM

## 2012-12-07 DIAGNOSIS — N185 Chronic kidney disease, stage 5: Secondary | ICD-10-CM | POA: Diagnosis present

## 2012-12-07 DIAGNOSIS — K5730 Diverticulosis of large intestine without perforation or abscess without bleeding: Secondary | ICD-10-CM | POA: Diagnosis present

## 2012-12-07 DIAGNOSIS — N189 Chronic kidney disease, unspecified: Secondary | ICD-10-CM

## 2012-12-07 DIAGNOSIS — I10 Essential (primary) hypertension: Secondary | ICD-10-CM

## 2012-12-07 DIAGNOSIS — Z803 Family history of malignant neoplasm of breast: Secondary | ICD-10-CM

## 2012-12-07 DIAGNOSIS — Z6833 Body mass index (BMI) 33.0-33.9, adult: Secondary | ICD-10-CM

## 2012-12-07 DIAGNOSIS — I440 Atrioventricular block, first degree: Secondary | ICD-10-CM | POA: Diagnosis present

## 2012-12-07 DIAGNOSIS — E875 Hyperkalemia: Secondary | ICD-10-CM | POA: Diagnosis present

## 2012-12-07 DIAGNOSIS — Z8249 Family history of ischemic heart disease and other diseases of the circulatory system: Secondary | ICD-10-CM

## 2012-12-07 DIAGNOSIS — I498 Other specified cardiac arrhythmias: Principal | ICD-10-CM | POA: Diagnosis present

## 2012-12-07 DIAGNOSIS — Z7982 Long term (current) use of aspirin: Secondary | ICD-10-CM

## 2012-12-07 DIAGNOSIS — Z833 Family history of diabetes mellitus: Secondary | ICD-10-CM

## 2012-12-07 DIAGNOSIS — N032 Chronic nephritic syndrome with diffuse membranous glomerulonephritis: Secondary | ICD-10-CM | POA: Diagnosis present

## 2012-12-07 DIAGNOSIS — Z23 Encounter for immunization: Secondary | ICD-10-CM

## 2012-12-07 DIAGNOSIS — Z905 Acquired absence of kidney: Secondary | ICD-10-CM

## 2012-12-07 DIAGNOSIS — I120 Hypertensive chronic kidney disease with stage 5 chronic kidney disease or end stage renal disease: Secondary | ICD-10-CM | POA: Diagnosis present

## 2012-12-07 DIAGNOSIS — Z91199 Patient's noncompliance with other medical treatment and regimen due to unspecified reason: Secondary | ICD-10-CM

## 2012-12-07 DIAGNOSIS — Z7682 Awaiting organ transplant status: Secondary | ICD-10-CM

## 2012-12-07 HISTORY — DX: Diverticulosis of intestine, part unspecified, without perforation or abscess without bleeding: K57.90

## 2012-12-07 LAB — BASIC METABOLIC PANEL
Calcium: 9.7 mg/dL (ref 8.4–10.5)
Chloride: 102 mEq/L (ref 96–112)
Creatinine, Ser: 10.28 mg/dL — ABNORMAL HIGH (ref 0.50–1.10)
GFR calc Af Amer: 4 mL/min — ABNORMAL LOW (ref >90)
Sodium: 136 mEq/L (ref 135–145)

## 2012-12-07 LAB — CBC
MCH: 31.1 pg (ref 26.0–34.0)
Platelets: 185 10*3/uL (ref 150–400)
RBC: 3.34 MIL/uL — ABNORMAL LOW (ref 3.87–5.11)
WBC: 12.3 10*3/uL — ABNORMAL HIGH (ref 4.0–10.5)

## 2012-12-07 IMAGING — CR DG CHEST 1V PORT
1 series · 1 of 1 positions shown · non-contrast
Comparison: Prior chest x-ray [DATE]

CLINICAL DATA: Follow up respiratory failure

PORTABLE CHEST - 1 VIEW

[AP]
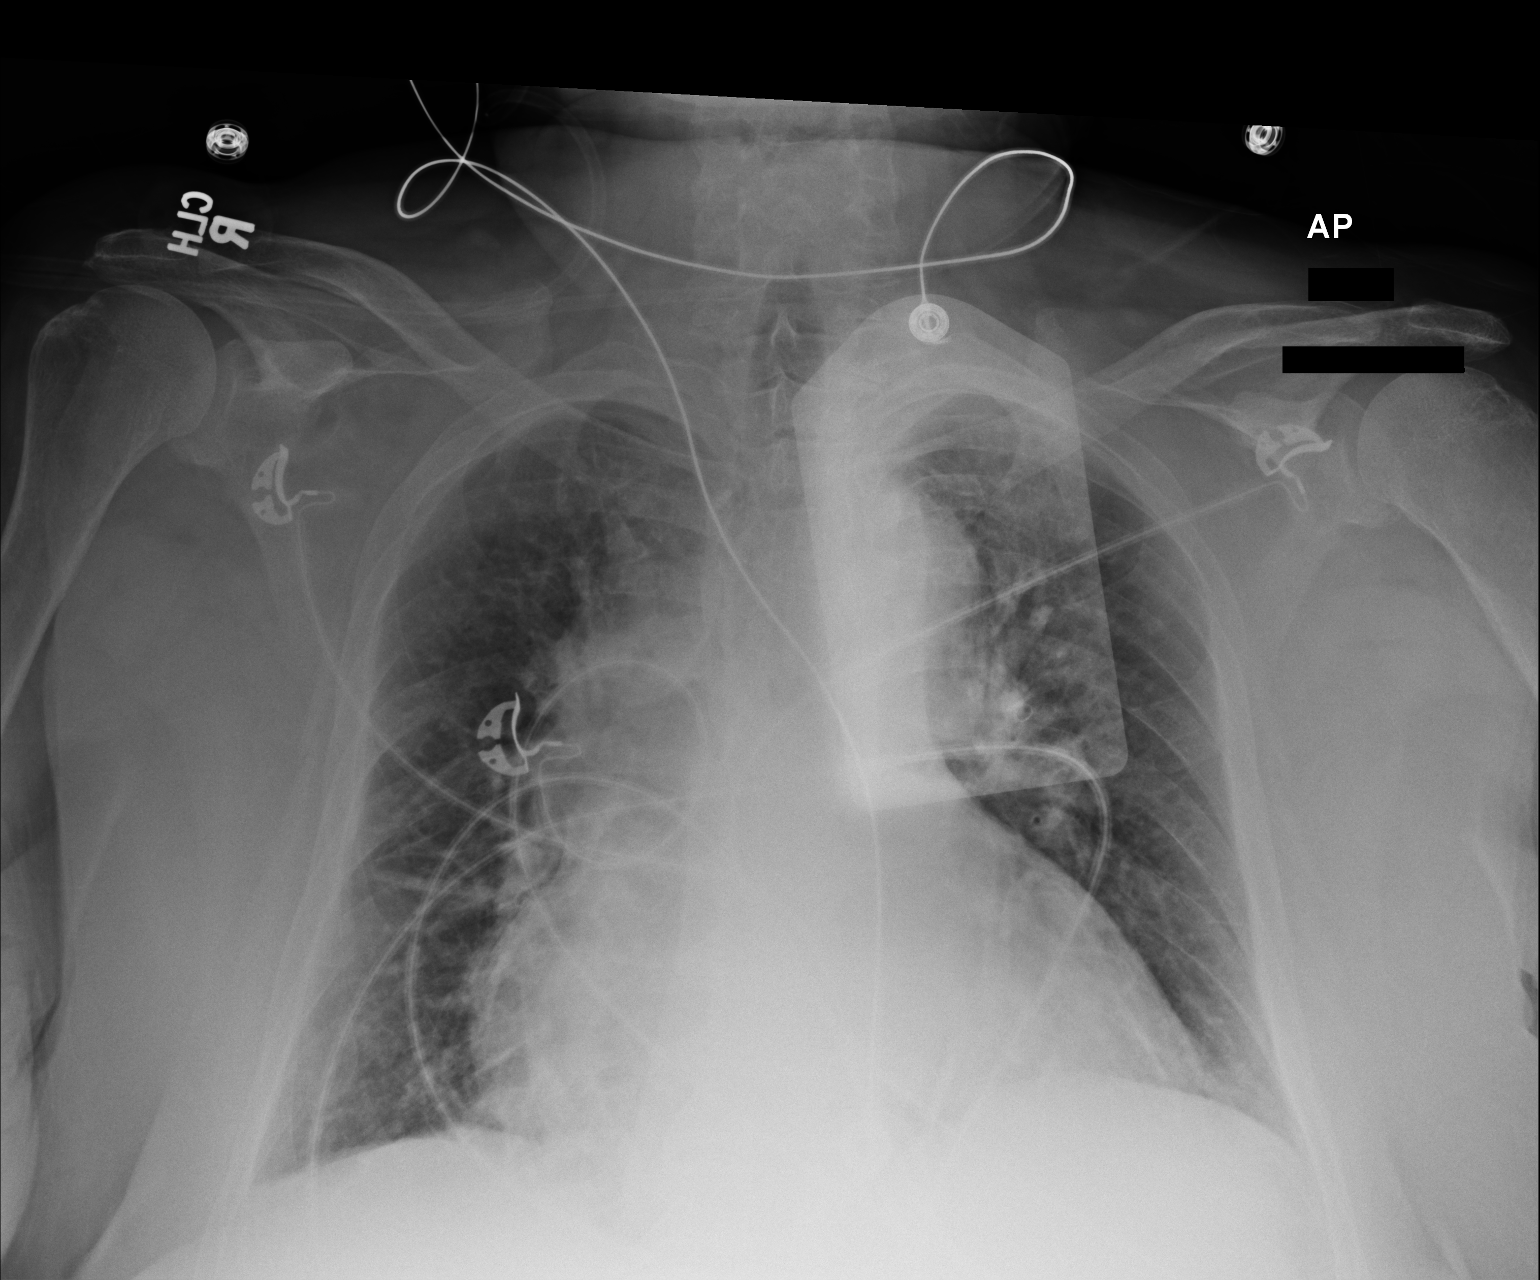

[1 of 1 positions shown; findings below may reference images not displayed]

FINDINGS: Defibrillator pad projects over the left chest.
Cardiomegaly appears progressed compared to prior.  There is
increased pulmonary vascular congestion and mild interstitial
edema.  No acute osseous abnormality. No large effusion.  No
pneumothorax.
IMPRESSION: Mild - moderate CHF with progression of cardiomegaly compared to
[DATE].

## 2012-12-07 MED ORDER — ACETAMINOPHEN 325 MG PO TABS
650.0000 mg | ORAL_TABLET | ORAL | Status: DC | PRN
  Administered 2012-12-08: 650 mg via ORAL
  Filled 2012-12-07: qty 2

## 2012-12-07 MED ORDER — SODIUM CHLORIDE 0.9 % IJ SOLN
3.0000 mL | Freq: Two times a day (BID) | INTRAMUSCULAR | Status: DC
  Administered 2012-12-07 – 2012-12-09 (×4): 3 mL via INTRAVENOUS

## 2012-12-07 MED ORDER — ASPIRIN EC 81 MG PO TBEC
81.0000 mg | DELAYED_RELEASE_TABLET | Freq: Every day | ORAL | Status: DC
  Administered 2012-12-08 – 2012-12-09 (×2): 81 mg via ORAL
  Filled 2012-12-07 (×2): qty 1

## 2012-12-07 MED ORDER — HEPARIN SODIUM (PORCINE) 5000 UNIT/ML IJ SOLN
5000.0000 [IU] | Freq: Three times a day (TID) | INTRAMUSCULAR | Status: DC
  Administered 2012-12-07 – 2012-12-09 (×5): 5000 [IU] via SUBCUTANEOUS
  Filled 2012-12-07 (×8): qty 1

## 2012-12-07 MED ORDER — SODIUM CHLORIDE 0.9 % IV SOLN: 250.0000 mL | INTRAVENOUS | Status: DC | PRN

## 2012-12-07 MED ORDER — LEVOTHYROXINE SODIUM 175 MCG PO TABS
175.0000 ug | ORAL_TABLET | ORAL | Status: DC
  Administered 2012-12-08 – 2012-12-09 (×2): 175 ug via ORAL
  Filled 2012-12-07 (×3): qty 1

## 2012-12-07 MED ORDER — FUROSEMIDE 80 MG PO TABS
80.0000 mg | ORAL_TABLET | Freq: Every day | ORAL | Status: DC
  Administered 2012-12-07: 80 mg via ORAL
  Filled 2012-12-07 (×2): qty 1

## 2012-12-07 MED ORDER — NITROGLYCERIN IN D5W 200-5 MCG/ML-% IV SOLN
3.0000 ug/min | INTRAVENOUS | Status: DC
  Administered 2012-12-07: 3 ug/min via INTRAVENOUS
  Administered 2012-12-08: 5 ug/min via INTRAVENOUS
  Filled 2012-12-07: qty 250

## 2012-12-07 MED ORDER — LEVOTHYROXINE SODIUM 175 MCG PO TABS: 262.5000 ug | ORAL_TABLET | ORAL | Status: DC

## 2012-12-07 MED ORDER — FERROUS FUMARATE 325 (106 FE) MG PO TABS
1.0000 | ORAL_TABLET | Freq: Two times a day (BID) | ORAL | Status: DC
  Administered 2012-12-08 – 2012-12-09 (×3): 106 mg via ORAL
  Filled 2012-12-07 (×5): qty 1

## 2012-12-07 MED ORDER — NITROGLYCERIN 0.4 MG SL SUBL: 0.4000 mg | SUBLINGUAL_TABLET | SUBLINGUAL | Status: DC | PRN

## 2012-12-07 MED ORDER — ONDANSETRON HCL 4 MG/2ML IJ SOLN
4.0000 mg | Freq: Four times a day (QID) | INTRAMUSCULAR | Status: DC | PRN
  Administered 2012-12-08 (×2): 4 mg via INTRAVENOUS
  Filled 2012-12-07 (×2): qty 2

## 2012-12-07 MED ORDER — SODIUM CHLORIDE 0.9 % IJ SOLN
3.0000 mL | INTRAMUSCULAR | Status: DC | PRN
  Administered 2012-12-08: 3 mL via INTRAVENOUS

## 2012-12-07 MED ORDER — LEVOTHYROXINE SODIUM 175 MCG PO TABS
175.0000 ug | ORAL_TABLET | Freq: Every day | ORAL | Status: DC
  Filled 2012-12-07: qty 1

## 2012-12-07 NOTE — Consult Note (Signed)
Patient ID: LAIKLYN CHESMORE MRN: [id number], DOB/AGE: 1946/02/28   Admit date: 12/07/2012  Primary Physician: Unice Cobble, MD Primary Cardiologist: New - P. Johnsie Cancel, MD  Pt. Profile:  67 year old female without prior cardiac history who presents on transfer from the [facility] emergency department secondary to symptomatic bradycardia.  Problem List  Past Medical History  Diagnosis Date  . Hypertension   . Hypothyroidism   . Gout   . Arthritis   . Anemia     r/t kidney function- receives Procrit  . Cancer     kidney  . FSGS (focal segmental glomerulosclerosis)     right kidney;  . Osteopenia   . Diverticulosis     a. 05/2012 colonoscopy  . CKD (chronic kidney disease)     followed by Dr. [name].    Past Surgical History  Procedure Laterality Date  . Total abdominal hysterectomy w/ bilateral salpingoophorectomy      fibroids  . Nephrectomy  1993    for malignancy- left  . Colonoscopy with polypectomy      Dr Henrene Pastor  . Av fistula placement      pt has not had to start dialysis  . Renal biopsy      right  . Colonoscopy      Allergies  Allergies  Allergen Reactions  . Cefaclor     REACTION: Rash  . Naproxen     REACTION: Rash  . Metoprolol Tartrate     REACTION: Hives  . Enalapril Maleate     REACTION: Cough    HPI  67 year old female without prior cardiac history. She does have a 20 year history of kidney disease related to FSGS and is followed closely by Dr. Erling Cruz.  She is status post AV fistula and is on the transplant list. Late last year, she was evaluated at [facility] in [place] and underwent stress testing as well as echocardiography, which she believes was normal. Though she is not on dialysis at this time, it is her belief that she is moving in that direction and has followup with Dr. [name] in June. She just saw Dr. [name] this past Monday and was noted to be hypertensive. She was placed on labetalol 200 mg twice a day. She  did not note any ill effects from this medication on either Monday, Tuesday, or Wednesday. Today, she got out of her car to walk into a [employer] and felt very weak. This worsened while she was standing in line when she went back out to her car. She then tried to go grocery shopping but stopped again secondary to weakness. At that point she returned home and in the setting of weakness as well as lightheadedness, EMS was called. Upon EMS arrival, she was found to be profoundly bradycardic and she was taken to the ED in [place]. There, she was treated with atropine followed by glucagon, calcium chloride, and subsequently transcutaneous pacing for heart rates that persisted in the 20s to 30s. Once paced, rates were in the 70s and pressures were in the 140s to 150s. She was also treated with hydrocortisone 100 mg and 100 mcg levothyroxine. Lab work revealed a TSH of 75.36. Her creatinine was elevated at 10.40 (similar to finding in April), magnesium was 2.3, potassium 5.0, and troponin was very mildly elevated at 0.014. We were called for transfer as there was concern that she would require temporary transvenous pacing if not permanent pacing and patient was transferred to [facility] for further evaluation. In route,  transcutaneous pacer was placed on standby as her underlying rate was in the 60s to 70s with sinus rhythm and first degree AV block. She denies any chest pain, dyspnea, PND, orthopnea, syncope, or edema. She does admit that she does not take her Synthroid as regularly scheduled because it has to be taken at particular times around meals and she has trouble being compliant with.  Home Medications  Prior to Admission medications   Medication Sig Start Date End Date Taking? Authorizing Provider  Acetaminophen (TYLENOL PO) Take by mouth as needed.    Historical Provider, MD  allopurinol (ZYLOPRIM) 100 MG tablet TAKE ONE TABLET BY MOUTH DAILY 06/22/12   Hendricks Limes, MD  amoxicillin (AMOXIL)  875 MG tablet Take 1 tablet (875 mg total) by mouth 2 (two) times daily. 07/09/12   Kandra Nicolas, MD  benzonatate (TESSALON) 200 MG capsule Take 1 capsule (200 mg total) by mouth at bedtime. Take as needed for cough 07/09/12   Kandra Nicolas, MD  calcitRIOL (ROCALTROL) 0.5 MCG capsule Take 0.5 mcg by mouth daily.    Historical Provider, MD  Calcium Carbonate (OS-CAL PO) Take 1 tablet by mouth 3 (three) times daily.    Historical Provider, MD  cloNIDine (CATAPRES) 0.2 MG tablet take 1 tablet by mouth twice a day 06/20/12   Hendricks Limes, MD  epoetin alfa (EPOGEN,PROCRIT) 2000 UNIT/ML injection Inject 2,000 Units into the skin every 30 (thirty) days. At [place] [facility]    Historical Provider, MD  ferrous fumarate (HEMOCYTE - 106 MG FE) 325 (106 FE) MG TABS Take 1 tablet by mouth 2 (two) times daily.    Historical Provider, MD  furosemide (LASIX) 80 MG tablet take 1 tablet by mouth once daily 06/20/12   Hendricks Limes, MD  levothyroxine (SYNTHROID, LEVOTHROID) 175 MCG tablet LABS OVERDUE,CALL FOR LAB APPOINTMENT 1 by mouth daily EXCEPT 1 1/2 on Wed 11/08/12   Hendricks Limes, MD  losartan (COZAAR) 100 MG tablet TAKE ONE TABLET BY MOUTH DAILY 10/11/12   Hendricks Limes, MD  verapamil (CALAN-SR) 240 MG CR tablet take 1 tablet by mouth twice a day 11/23/12   Hendricks Limes, MD    Family History  Family History  Problem Relation Age of Onset  . Deep vein thrombosis Mother     post thyroid surgery  . Heart attack Father 21    deceased  . Cancer Sister     breast  . Cancer Paternal Aunt     pancreatic  . Diabetes Maternal Grandfather   . Stroke Paternal Grandmother     in 53s  . Colon cancer Neg Hx   . Esophageal cancer Neg Hx   . Stomach cancer Neg Hx   . Rectal cancer Neg Hx     Social History  History   Social History  . Marital Status: Married    Spouse Name: N/A    Number of Children: N/A  . Years of Education: N/A   Occupational History  . Not on file.   Social  History Main Topics  . Smoking status: Never Smoker   . Smokeless tobacco: Never Used  . Alcohol Use: Yes     Comment: rarely  . Drug Use: No  . Sexually Active:    Other Topics Concern  . Not on file   Social History Narrative   Lives in [place] with husband.  Retired.  Previously worked in [employer] @ [employer]     Review  of Systems General:  Weakness and lightheadedness as outlined above. No chills, fever, night sweats or weight changes.  Cardiovascular:  No chest pain, dyspnea on exertion, edema, orthopnea, palpitations, paroxysmal nocturnal dyspnea. Dermatological: No rash, lesions/masses Respiratory: No cough, dyspnea Urologic: No hematuria, dysuria Abdominal:   Some degree of chronic constipation. No nausea, vomiting, diarrhea, bright red blood per rectum, melena, or hematemesis Neurologic:  No visual changes, wkns, changes in mental status. All other systems reviewed and are otherwise negative except as noted above.  Physical Exam  There were no vitals taken for this visit.  General: Pleasant, NAD Psych: Normal affect. Neuro: Alert and oriented X 3. Moves all extremities spontaneously. HEENT: Normal  Neck: Supple without bruits or JVD. Lungs:  Resp regular and unlabored, CTA. Heart: RRR no s3, s4, or murmurs. Abdomen: Soft, non-tender, non-distended, BS + x 4.  Extremities: No clubbing, cyanosis or edema. DP/PT/Radials 2+ and equal bilaterally.  Labs  TSH 7.536 Magnesium 2.30 troponin T 0.014 CK 157 sodium 137, potassium 5.0,  chloride 102, CO2 19, BUN 71, creatinine 10.40, glucose 120  Alkaline phosphatase 65, total bilirubin 0.2, total protein 6.1, albumin 3.5, ALT 21, A ST 28  Hemoglobin 10.3, hematocrit 31.8, WBC 9.0, platelets 217  Radiology/Studies  No results found.  ECG   from [facility] ER: Idioventricular rhythm at a rate of 32.   12-lead here shows sinus rhythm with first degree AV block with a PR interval of approximately 420 ms. Heart  rate 66 beats per minute. She has an IVCD and no acute ST or T changes.  ASSESSMENT AND PLAN  1. Symptomatic bradycardia with idioventricular rhythm and high-grade first degree AV block: This occurred today in the setting of recent initiation of beta blocker therapy as well as profound hypothyroidism and relative hyperkalemia with known renal disease. Not entirely clear what the driving factor may have been the beta blocker, verapamil, and clonidine therapy likely contributed. She's currently stable with rates in the 60s to 21s and has not required transcutaneous pacing at this time. There is no indication for transvenous pacing at this time. We recommend cycling enzymes, check an echo, and with holding beta blocker, calcium channel blocker, and clonidine therapy for the time being. Will add IV nitroglycerin for the time being for management of hypertension.  I have spoken to Dr. Elsworth Soho, who will assist with management of medical issues.   2. Chronic kidney disease with FSGS: This is followed closely by Dr. [name] as an outpatient.  I have spoken with nephrology who will see the pt over the weekend.   3. Profound hypothyroidism: In the setting of some degree of noncompliance with her home dose of Synthroid. Defer management of hypothyroidism to the medicine team.  4. Hypertension: As above holding home agents. Will add IV nitroglycerin for the time being.   Signed, Murray Hodgkins, NP 12/07/2012, 8:05 PM  Patient examined chart reviewed.  Symptomatic bradycardia in setting of multiple medical issues and new beta blocker Rx for HTN.  Previous ECG;s show no primary conduction disease just LVH.  CRF with relative hyperkalemia and hypothyroidism from noncompliance.  Currently hemodynamically stable in NSR rate 70-80 with first degree block.  EMS strips with idioventricular rhythm in 30's.  Rx with calcium, insulin , glucose and steroids ( ? Concern for addisons physiology).  Glucagon was also given for  beta blocker reversal.  Hold beta blocker and verapamil.  Nitrates and or hydralazine for HTN if it rebounds.  Rx further bradycardia more with  calcium which will be more effective for cardiac issues. Given severe renal failure watch closely for further elevations in K.  Patient indicates normal urination. Exam remarkable for obesity, fistula in RUE and no uremic rub.  Clinical history remarkable for no chest pain or cardiac history.  Patient to be admitted by CCM or medicine with renal consultation as these are the main issues. Did get synthroid 100ug at [facility] Replacement of thyroid will have to be slow to prevent arrhythmias.    Jenkins Rouge 11:36 PM 12/07/2012

## 2012-12-08 DIAGNOSIS — E785 Hyperlipidemia, unspecified: Secondary | ICD-10-CM

## 2012-12-08 LAB — T4, FREE: Free T4: 1.06 ng/dL (ref 0.80–1.80)

## 2012-12-08 LAB — BASIC METABOLIC PANEL
Calcium: 9.7 mg/dL (ref 8.4–10.5)
GFR calc Af Amer: 4 mL/min — ABNORMAL LOW (ref >90)
GFR calc non Af Amer: 3 mL/min — ABNORMAL LOW (ref >90)
Potassium: 4.9 mEq/L (ref 3.5–5.1)
Sodium: 140 mEq/L (ref 135–145)

## 2012-12-08 LAB — CBC
Platelets: 220 10*3/uL (ref 150–400)
RBC: 3.49 MIL/uL — ABNORMAL LOW (ref 3.87–5.11)
WBC: 10.1 10*3/uL (ref 4.0–10.5)

## 2012-12-08 LAB — MAGNESIUM: Magnesium: 2.4 mg/dL (ref 1.5–2.5)

## 2012-12-08 LAB — LIPID PANEL
Cholesterol: 180 mg/dL (ref 0–200)
Triglycerides: 61 mg/dL (ref <150)
VLDL: 12 mg/dL (ref 0–40)

## 2012-12-08 LAB — TROPONIN I
Troponin I: <0.3 ng/mL (ref <0.30)
Troponin I: <0.3 ng/mL (ref <0.30)

## 2012-12-08 LAB — TSH: TSH: 66.01 u[IU]/mL — ABNORMAL HIGH (ref 0.350–4.500)

## 2012-12-08 MED ORDER — FUROSEMIDE 80 MG PO TABS
80.0000 mg | ORAL_TABLET | Freq: Every morning | ORAL | Status: DC
  Administered 2012-12-09: 80 mg via ORAL
  Filled 2012-12-08: qty 1

## 2012-12-08 MED ORDER — SODIUM POLYSTYRENE SULFONATE 15 GM/60ML PO SUSP
30.0000 g | Freq: Once | ORAL | Status: AC
  Administered 2012-12-08: 30 g via ORAL
  Filled 2012-12-08: qty 120

## 2012-12-08 MED ORDER — HYDRALAZINE HCL 20 MG/ML IJ SOLN
INTRAMUSCULAR | Status: AC
  Administered 2012-12-08: 10 mg via INTRAVENOUS
  Filled 2012-12-08: qty 1

## 2012-12-08 MED ORDER — HYDRALAZINE HCL 25 MG PO TABS
25.0000 mg | ORAL_TABLET | Freq: Three times a day (TID) | ORAL | Status: DC
  Administered 2012-12-08 – 2012-12-09 (×4): 25 mg via ORAL
  Filled 2012-12-08 (×6): qty 1

## 2012-12-08 MED ORDER — HYDRALAZINE HCL 20 MG/ML IJ SOLN
10.0000 mg | INTRAMUSCULAR | Status: DC | PRN
  Administered 2012-12-08 – 2012-12-09 (×2): 10 mg via INTRAVENOUS
  Filled 2012-12-08 (×2): qty 1

## 2012-12-08 MED ORDER — FUROSEMIDE 10 MG/ML IJ SOLN
INTRAMUSCULAR | Status: AC
  Filled 2012-12-08: qty 4

## 2012-12-08 MED ORDER — SODIUM CHLORIDE 0.9 % IV SOLN: 250.0000 mL | INTRAVENOUS | Status: DC | PRN

## 2012-12-08 MED ORDER — FUROSEMIDE 10 MG/ML IJ SOLN
40.0000 mg | Freq: Once | INTRAMUSCULAR | Status: AC
  Administered 2012-12-08: 40 mg via INTRAVENOUS

## 2012-12-08 NOTE — H&P (Addendum)
PULMONARY  / CRITICAL CARE MEDICINE  Name: Tabitha Green MRN: [id number] DOB: 1945/10/18    ADMISSION DATE:  12/07/2012  PRIMARY SERVICE: PCCM  CHIEF COMPLAINT:   Symptomatic bradycardia.  BRIEF PATIENT DESCRIPTION:  67 years old female with CKD. Presents with presyncopal episodes in the setting of idioventricular rhythm with HR in the 30's   LINES / TUBES: - Peripheral IV's   ANTIBIOTICS: No antibiotics  HISTORY OF PRESENT ILLNESS:   67 years old female with PMH relevant for CKD secondary to FSG, HTN and hypothyroidism. She was started on Labetalol 5 days ago by Dr. [name] for treatment of uncontrolled HTN. On Wednesday she started having dizziness and today symptoms worsened to the point that she was near syncopal. EMS was called and was taken to the ED in [place]. She was found to have a HR of 20 to 30/min and external transcutaneous pacing was started. Of note, her TSH was 75.3 in the setting of poor compliance with her synthroid. Her creatinine is elevated but stable. She has mild hyperkalemia. She was transferred to [facility] for possible transvenous pacemaker placement. On route her HR was in the 60's on sinus rhythm and 1st degree AV block. The patient denies CP, SOB, orthopnea, PND, wheezing. Denies Fever, cough, urinary symptoms, diarrhea.   PAST MEDICAL HISTORY :  Past Medical History  Diagnosis Date  . Hypertension   . Hypothyroidism   . Gout   . Arthritis   . Anemia     r/t kidney function- receives Procrit  . Cancer     kidney  . FSGS (focal segmental glomerulosclerosis)     right kidney;  . Osteopenia   . Diverticulosis     a. 05/2012 colonoscopy  . CKD (chronic kidney disease)     followed by Dr. [name].   Past Surgical History  Procedure Laterality Date  . Total abdominal hysterectomy w/ bilateral salpingoophorectomy      fibroids  . Nephrectomy  1993    for malignancy- left  . Colonoscopy with polypectomy      Dr Henrene Pastor  . Av fistula placement     pt has not had to start dialysis  . Renal biopsy      right  . Colonoscopy     Prior to Admission medications   Medication Sig Start Date End Date Taking? Authorizing Provider  acetaminophen (TYLENOL) 500 MG tablet Take 500-750 mg by mouth every 6 (six) hours as needed for pain.   Yes Historical Provider, MD  allopurinol (ZYLOPRIM) 100 MG tablet Take 100 mg by mouth every morning.   Yes Historical Provider, MD  calcitRIOL (ROCALTROL) 0.5 MCG capsule Take 0.5 mcg by mouth daily.   Yes Historical Provider, MD  CALCIUM CARBONATE PO Take 2 tablets by mouth 3 (three) times daily.   Yes Historical Provider, MD  cloNIDine (CATAPRES) 0.2 MG tablet Take 0.2 mg by mouth 2 (two) times daily.   Yes Historical Provider, MD  epoetin alfa (EPOGEN,PROCRIT) 13086 UNIT/ML injection Inject 20,000 Units into the skin every 30 (thirty) days.   Yes Historical Provider, MD  furosemide (LASIX) 80 MG tablet Take 80 mg by mouth every morning.   Yes Historical Provider, MD  ibuprofen (ADVIL,MOTRIN) 200 MG tablet Take 200 mg by mouth every 6 (six) hours as needed for pain.   Yes Historical Provider, MD  IRON PO Take 2 tablets by mouth 2 (two) times daily.   Yes Historical Provider, MD  labetalol (NORMODYNE) 200 MG tablet Take 200 mg  by mouth 2 (two) times daily.   Yes Historical Provider, MD  levothyroxine (SYNTHROID, LEVOTHROID) 175 MCG tablet Take 175-262.5 mcg by mouth daily before breakfast. 1 tablet daily except 1.5 tablets on Tues and Thurs   Yes Historical Provider, MD  verapamil (CALAN-SR) 240 MG CR tablet Take 240 mg by mouth 2 (two) times daily.   Yes Historical Provider, MD   Allergies  Allergen Reactions  . Cefaclor Rash  . Naproxen Rash  . Enalapril Maleate Cough  . Metoprolol Tartrate Rash    On legs    FAMILY HISTORY:  Family History  Problem Relation Age of Onset  . Deep vein thrombosis Mother     post thyroid surgery  . Heart attack Father 48    deceased  . Cancer Sister     breast  .  Cancer Paternal Aunt     pancreatic  . Diabetes Maternal Grandfather   . Stroke Paternal Grandmother     in 77s  . Colon cancer Neg Hx   . Esophageal cancer Neg Hx   . Stomach cancer Neg Hx   . Rectal cancer Neg Hx    SOCIAL HISTORY:  reports that she has never smoked. She has never used smokeless tobacco. She reports that  drinks alcohol. She reports that she does not use illicit drugs.  REVIEW OF SYSTEMS:  All systems reviewed and found negative except for what I mentioned in the HPI.  SUBJECTIVE:   VITAL SIGNS: Temp:  [97.5 F (36.4 C)-97.6 F (36.4 C)] 97.5 F (36.4 C) (05/02 2330) Pulse Rate:  [52-70] 70 (05/03 0100) Resp:  [18-35] 20 (05/03 0100) BP: (155-184)/(66-91) 181/78 mmHg (05/03 0100) SpO2:  [94 %-99 %] 96 % (05/03 0100) Weight:  [212 lb 1.3 oz (96.2 kg)] 212 lb 1.3 oz (96.2 kg) (05/02 2000) HEMODYNAMICS:   VENTILATOR SETTINGS:   INTAKE / OUTPUT: Intake/Output     05/02 0701 - 05/03 0700   P.O. 250   I.V. (mL/kg) 23 (0.2)   Total Intake(mL/kg) 273 (2.8)   Urine (mL/kg/hr) 100   Total Output 100   Net +173         PHYSICAL EXAMINATION: General: Pale, ill appearance, no apparent distress. Eyes: Anicteric sclerae. ENT: Oropharynx clear. Moist mucous membranes. No thrush Lymph: No cervical, supraclavicular, or axillary lymphadenopathy. Heart: Normal S1, S2. No murmurs, rubs, or gallops appreciated. No bruits, equal pulses. Lungs: Normal excursion, no dullness to percussion. Good air movement bilaterally, without wheezes or crackles. Normal upper airway sounds without evidence of stridor. Abdomen: Abdomen soft, non-tender and not distended, normoactive bowel sounds. No hepatosplenomegaly or masses. Musculoskeletal: No clubbing or synovitis. Skin: No rashes or lesions Neuro: No focal neurologic deficits.  LABS:  Recent Labs Lab 12/07/12 2048 12/07/12 2326 12/07/12 2327  HGB 10.4*  --   --   WBC 12.3*  --   --   PLT 185  --   --   NA 136  --    --   K 5.3*  --   --   CL 102  --   --   CO2 18*  --   --   GLUCOSE 102*  --   --   BUN 70*  --   --   CREATININE 10.28*  --   --   CALCIUM 9.7  --   --   MG  --  2.4  --   LATICACIDVEN  --   --  1.1  TROPONINI <0.30  --   --  No results found for this basename: GLUCAP,  in the last 168 hours  CXR:  No infiltrates. Possibly mild increased vascular congestion.   ASSESSMENT / PLAN:  PULMONARY A: 1) No issues. Saturating 96 to 99% on 2 L [place] P:   - Supplemental oxygen to keep O2 sats > 94%  CARDIOVASCULAR A:  1) Symptomatic bradycardia likely due to new labetalol in combination with verapamil and in the setting of CKD. The patient is significantly hypothyroid and this may also be contributing to her bradycardia. 2) HTN P:  - Cardiology input appreciated - No need for pacemaker at this time - Will monitor closely in the ICU. She should improve with time - We will hold antihypertensives for now - On NTG drip for hypertension  RENAL A:   1) CKD. No indication for emergent dialysis 2) Mild hyperkalemia P:   - Kayexalate - Monitor chemistry in am - Nephrology consult in am  GASTROINTESTINAL A:   1) No issues  HEMATOLOGIC A:   1) Anemia of chronic disease P:  - Will monitor CBC - No indication for transfusion  INFECTIOUS A:   1) No evidence of acute infection. Only mildly elevated WBC P: - No antibiotics  ENDOCRINE A:   1) Uncontrolled hypothyroidism in the setting of non compliance. No evidence of acute mixedematous coma (normal mental status, normal sodium, glucose and body temperature). P:   - Will restart Synthroid at her usual home dose - No need for steroids.  NEUROLOGIC A:   1) Near syncopal episodes due to severe bradycardia. Now awake and alert.    I have personally obtained a history, examined the patient, evaluated laboratory and imaging results, formulated the assessment and plan and placed orders. CRITICAL CARE: The patient is  critically ill with multiple organ systems failure and requires high complexity decision making for assessment and support, frequent evaluation and titration of therapies, application of advanced monitoring technologies and extensive interpretation of multiple databases. Critical Care Time devoted to patient care services described in this note is 60 minutes.   Waynetta Pean, MD Pulmonary and [place] Pager: [phone]  12/08/2012, 1:22 AM

## 2012-12-08 NOTE — Progress Notes (Signed)
Pt had another episode of emesis approx 332ml out. Dr Marval Regal notified. Pt denies further nausea at this time. Will notify Dr Marval Regal if occurs again for possible HD.

## 2012-12-08 NOTE — Progress Notes (Signed)
Pt had another episode of emesis. Dr Marval Regal notified. PRN zofran given. No further orders received.

## 2012-12-08 NOTE — Progress Notes (Signed)
Assumed care for this 7a-7p shift. Pt resting comfortably in bed with husband at bedside. Dr Oliver Pila rounding on pt and order received for hydralazine iv and to stop nitro gtt. Reviewed plan of care today and importance of using call light for needs or assistance and not to get oob without staff assistance. Call light within reach. Monitoring closely.

## 2012-12-08 NOTE — Consult Note (Signed)
Reason for Consult:progressive CKD stage 5 not yet on HD Referring Physician: Soniya Ovens is an 67 y.o. female.  HPI: Pt is a 67yo WF with PMH sig for uncontrolled hypothyroidism, HTN, and CKD stage 5 not yet on HD who presented to an OSH with symptomatic bradycardia.  Pt was recently started on labetalol as an outpt in combo with verapamil and clonidine for BP control.  Her HR dropped into the 20's and 30s and presented to an OSH with dizziness and presyncope.  Pt received atropine, glucagon, calcium chloride and transcutaneous pacing prior to arrival at [facility]. She was admitted to CCU for further monitoring and treatment.  We were asked to see the patient as her Scr has continued to rise.  She was seen by Dr. [name] on 11/29/12 and has been listed at [facility] for renal transplant (on list for over 3 years).  Despite a markedly elevated BUN/Cr, she has remained free of uremic symptoms and therefore has not been started on HD.  Trend in Creatinine: Creatinine, Ser  Date/Time Value Range Status  12/08/2012  2:35 AM 10.62* 0.50 - 1.10 mg/dL Final  12/07/2012  8:48 PM 10.28* 0.50 - 1.10 mg/dL Final  11/22/2012  8:14 AM 9.58* 0.50 - 1.10 mg/dL Final  08/30/2012  8:19 AM 7.44* 0.50 - 1.10 mg/dL Final  10/06/2010  9:33 AM 4.30* 0.4 - 1.2 mg/dL Final  09/10/2009  8:12 AM 3.4* 0.4-1.2 mg/dL Final  10/02/2008  9:50 AM 3.1* 0.4-1.2 mg/dL Final    PMH:   Past Medical History  Diagnosis Date  . Hypertension   . Hypothyroidism   . Gout   . Arthritis   . Anemia     r/t kidney function- receives Procrit  . Cancer     kidney  . FSGS (focal segmental glomerulosclerosis)     right kidney;  . Osteopenia   . Diverticulosis     a. 05/2012 colonoscopy  . CKD (chronic kidney disease)     followed by Dr. [name].    PSH:   Past Surgical History  Procedure Laterality Date  . Total abdominal hysterectomy w/ bilateral salpingoophorectomy      fibroids  . Nephrectomy  1993    for malignancy- left  .  Colonoscopy with polypectomy      Dr Henrene Pastor  . Av fistula placement      pt has not had to start dialysis  . Renal biopsy      right  . Colonoscopy      Allergies:  Allergies  Allergen Reactions  . Cefaclor Rash  . Naproxen Rash  . Enalapril Maleate Cough  . Metoprolol Tartrate Rash    On legs    Medications:   Prior to Admission medications   Medication Sig Start Date End Date Taking? Authorizing Provider  acetaminophen (TYLENOL) 500 MG tablet Take 500-750 mg by mouth every 6 (six) hours as needed for pain.   Yes Historical Provider, MD  allopurinol (ZYLOPRIM) 100 MG tablet Take 100 mg by mouth every morning.   Yes Historical Provider, MD  calcitRIOL (ROCALTROL) 0.5 MCG capsule Take 0.5 mcg by mouth daily.   Yes Historical Provider, MD  CALCIUM CARBONATE PO Take 2 tablets by mouth 3 (three) times daily.   Yes Historical Provider, MD  cloNIDine (CATAPRES) 0.2 MG tablet Take 0.2 mg by mouth 2 (two) times daily.   Yes Historical Provider, MD  epoetin alfa (EPOGEN,PROCRIT) 09811 UNIT/ML injection Inject 20,000 Units into the skin every 30 (thirty)  days.   Yes Historical Provider, MD  furosemide (LASIX) 80 MG tablet Take 80 mg by mouth every morning.   Yes Historical Provider, MD  ibuprofen (ADVIL,MOTRIN) 200 MG tablet Take 200 mg by mouth every 6 (six) hours as needed for pain.   Yes Historical Provider, MD  IRON PO Take 2 tablets by mouth 2 (two) times daily.   Yes Historical Provider, MD  labetalol (NORMODYNE) 200 MG tablet Take 200 mg by mouth 2 (two) times daily.   Yes Historical Provider, MD  levothyroxine (SYNTHROID, LEVOTHROID) 175 MCG tablet Take 175-262.5 mcg by mouth daily before breakfast. 1 tablet daily except 1.5 tablets on Tues and Thurs   Yes Historical Provider, MD  verapamil (CALAN-SR) 240 MG CR tablet Take 240 mg by mouth 2 (two) times daily.   Yes Historical Provider, MD    Inpatient medications: . aspirin EC  81 mg Oral Daily  . ferrous fumarate  1 tablet Oral  BID WC  . furosemide  40 mg Intravenous Once  . heparin  5,000 Units Subcutaneous Q8H  . hydrALAZINE  25 mg Oral Q8H  . levothyroxine  175 mcg Oral Custom  . [START ON 12/11/2012] levothyroxine  262.5 mcg Oral Custom  . sodium chloride  3 mL Intravenous Q12H    Discontinued Meds:   Medications Discontinued During This Encounter  Medication Reason  . epoetin alfa (EPOGEN,PROCRIT) 2000 UNIT/ML injection Inpatient Standard  . verapamil (CALAN-SR) 240 MG CR tablet Entry Error  . cloNIDine (CATAPRES) 0.2 MG tablet Entry Error  . furosemide (LASIX) 80 MG tablet Entry Error  . allopurinol (ZYLOPRIM) 100 MG tablet Entry Error  . losartan (COZAAR) 100 MG tablet Patient has not taken in last 30 days  . levothyroxine (SYNTHROID, LEVOTHROID) 175 MCG tablet Entry Error  . Acetaminophen (TYLENOL PO) Inpatient Standard  . amoxicillin (AMOXIL) 875 MG tablet Completed Course  . benzonatate (TESSALON) 200 MG capsule Patient has not taken in last 30 days  . Calcium Carbonate (OS-CAL PO) Entry Error  . ferrous fumarate (HEMOCYTE - 106 MG FE) 325 (106 FE) MG TABS Inpatient Standard  . levothyroxine (SYNTHROID, LEVOTHROID) tablet 175 mcg   . nitroGLYCERIN (NITROSTAT) SL tablet 0.4 mg   . nitroGLYCERIN 0.2 mg/mL in dextrose 5 % infusion   . furosemide (LASIX) tablet 80 mg     Social History:  reports that she has never smoked. She has never used smokeless tobacco. She reports that  drinks alcohol. She reports that she does not use illicit drugs.  Family History:   Family History  Problem Relation Age of Onset  . Deep vein thrombosis Mother     post thyroid surgery  . Heart attack Father 58    deceased  . Cancer Sister     breast  . Cancer Paternal Aunt     pancreatic  . Diabetes Maternal Grandfather   . Stroke Paternal Grandmother     in 38s  . Colon cancer Neg Hx   . Esophageal cancer Neg Hx   . Stomach cancer Neg Hx   . Rectal cancer Neg Hx     A comprehensive review of systems was  negative except for: Constitutional: positive for fatigue Cardiovascular: positive for near-syncope Gastrointestinal: positive for nausea and vomiting Weight change:   Intake/Output Summary (Last 24 hours) at 12/08/12 1000 Last data filed at 12/08/12 0700  Gross per 24 hour  Intake 571.86 ml  Output    225 ml  Net 346.86 ml    General  appearance: alert, cooperative and no distress Head: Normocephalic, without obvious abnormality, atraumatic Eyes: negative findings: lids and lashes normal, conjunctivae and sclerae normal and corneas clear Neck: no adenopathy, no carotid bruit, no JVD, supple, symmetrical, trachea midline and thyroid not enlarged, symmetric, no tenderness/mass/nodules Resp: rales bibasilar Cardio: no rub GI: soft, non-tender; bowel sounds normal; no masses,  no organomegaly Extremities: extremities normal, atraumatic, no cyanosis or edema and RUE AVF +T/B  Labs: Basic Metabolic Panel:  Recent Labs Lab 12/07/12 2048 12/08/12 0235  NA 136 140  K 5.3* 4.9  CL 102 102  CO2 18* 21  GLUCOSE 102* 110*  BUN 70* 72*  CREATININE 10.28* 10.62*  CALCIUM 9.7 9.7  PHOS  --  6.2*   Liver Function Tests: No results found for this basename: AST, ALT, ALKPHOS, BILITOT, PROT, ALBUMIN,  in the last 168 hours No results found for this basename: LIPASE, AMYLASE,  in the last 168 hours No results found for this basename: AMMONIA,  in the last 168 hours CBC:  Recent Labs Lab 12/07/12 2048 12/08/12 0235  WBC 12.3* 10.1  HGB 10.4* 10.8*  HCT 31.2* 32.3*  MCV 93.4 92.6  PLT 185 220   PT/INR: @labrcntip (inr:5) Cardiac Enzymes:  Recent Labs Lab 12/07/12 2048 12/08/12 0235 12/08/12 0824  TROPONINI <0.30 <0.30 <0.30   CBG: No results found for this basename: GLUCAP,  in the last 168 hours  Iron Studies: No results found for this basename: IRON, TIBC, TRANSFERRIN, FERRITIN,  in the last 168 hours  Xrays/Other Studies: Dg Chest Port 1 View  12/07/2012  *RADIOLOGY  REPORT*  Clinical Data: Follow up respiratory failure  PORTABLE CHEST - 1 VIEW  Comparison: Prior chest x-ray 10/06/2010  Findings: Defibrillator pad projects over the left chest. Cardiomegaly appears progressed compared to prior.  There is increased pulmonary vascular congestion and mild interstitial edema.  No acute osseous abnormality. No large effusion.  No pneumothorax.  IMPRESSION:  Mild - moderate CHF with progression of cardiomegaly compared to 10/06/2010.   Original Report Authenticated By: Jacqulynn Cadet, M.D.      Assessment/Plan:    1.  Symptomatic bradycardia-Pt received atropine, glucagon, calcium chloride and transcutaneous pacing prior to arrival at [facility].  Now with first degree AVB but improved HR off of labetalol. 2. CKD stage 5 not yet on HD.  She does not have asterixis but did have some N/V this am.  Possibly early uremia.  She has had a markedly elevated BUN/Cr over the last several months and hopefully her Scr will improve with increased HR and off ARB.  She does not want to start HD at this time, however, I did discuss with the family that if her BUN/Cr continued to climb we would need to start as she has a very low GFR at baseline. 3. hyeprkalemia- likely combo of #1 and #2.  Improved off of ARB  4. Hypothyroidism- TSH >70.  Per primary svc 5. Vascular access- RUE AVF mature and ready for use if needed 6. HTN- poorly controlled.  On increased hydralazine and agree with lasix.  Again may be better controlled with HD. 7. ACDz- follow h/h and cont with outp EPO 8. Dispo- will see how she responds to meds and if Scr and/or N/V worsen, will plan to initiate HD while she is an inpt.  [place] A 12/08/2012, 10:00 AM

## 2012-12-08 NOTE — H&P (Signed)
PULMONARY  / CRITICAL CARE MEDICINE  Name: Tabitha Green MRN: [id number] DOB: [birth year]    ADMISSION DATE:  12/07/2012 CONSULTATION DATE:  12/07/2012  PRIMARY SERVICE: PCCM  CHIEF COMPLAINT:  Symptomatic bradycardia.  BRIEF PATIENT DESCRIPTION: 67 yo with CKD admitted from [facility] ED with symptomatic bradycardia in setting of newly started beta-blocker, chromic calcium-channel blocker / clonidine, hypothyroidism and hyperkalemia.  EVENTS / STUDIES: 5/2  Transferred form [place] with symptomatic bradycardia  LINES / TUBES:  ANTIBIOTICS:  INTERVAL HISTORY:  Bradycardia resolved.  No complaints this AM.  VITAL SIGNS: Temp:  [97.5 F (36.4 C)-97.8 F (36.6 C)] 97.8 F (36.6 C) (05/03 0330) Pulse Rate:  [52-84] 66 (05/03 0600) Resp:  [16-35] 16 (05/03 0600) BP: (155-184)/(66-91) 168/68 mmHg (05/03 0600) SpO2:  [94 %-100 %] 98 % (05/03 0600) Weight:  [95.6 kg (210 lb 12.2 oz)-96.2 kg (212 lb 1.3 oz)] 95.6 kg (210 lb 12.2 oz) (05/03 0436)  HEMODYNAMICS:   VENTILATOR SETTINGS:   INTAKE / OUTPUT: Intake/Output     05/02 0701 - 05/03 0700 05/03 0701 - 05/04 0700   P.O. 470    I.V. (mL/kg) 100.4 (1)    Total Intake(mL/kg) 570.4 (6)    Urine (mL/kg/hr) 225    Total Output 225     Net +345.4            PHYSICAL EXAMINATION: General: No distress, comfortable ENT: PERRL Heart: Regular, no murmurs Lungs: Bilateral air entry, vesicular breath sounds Abdomen: Soft, non-tender badomen Musculoskeletal: No clubbing / edema Skin: Intact Neuro: No focal neurologic deficits  LABS:  Recent Labs Lab 12/07/12 2048 12/07/12 2326 12/07/12 2327 12/08/12 0235  HGB 10.4*  --   --  10.8*  WBC 12.3*  --   --  10.1  PLT 185  --   --  220  NA 136  --   --  140  K 5.3*  --   --  4.9  CL 102  --   --  102  CO2 18*  --   --  21  GLUCOSE 102*  --   --  110*  BUN 70*  --   --  72*  CREATININE 10.28*  --   --  10.62*  CALCIUM 9.7  --   --  9.7  MG  --  2.4  --   --   PHOS   --   --   --  6.2*  LATICACIDVEN  --   --  1.1  --   TROPONINI <0.30  --   --  <0.30   No results found for this basename: GLUCAP,  in the last 168 hours  CXR:   ASSESSMENT / PLAN:  PULMONARY A:  No active issues P:   Goal SpO2>92 Supplemental oxygen PRN  CARDIOVASCULAR A: Symptomatic bradycardia in setting of newly started beta-blocker, chromic calcium-channel blocker / clonidine, hypothyroidism and hyperkalemia.  Hypertension.  P:  Cardiology following Hold Clonidine, beta-blockers, calcium-channel blockers D/c Nitroglycerine Start Hydralazine ASA TTE pending  RENAL A:  CKD.  Hyperkalemia, resolved. P:   Nephrology consultation pending Trend BMP Lasix as preadmission   GASTROINTESTINAL A:  Nausea. P: Zofran PRN Renal diet GI Px is not indicated  HEMATOLOGIC A:  Anemia of chronic / trenal disease. P:  Trend CBC Iron replacement Heparin for DVT Px  INFECTIOUS A:  No active issues. P: No interventions required  ENDOCRINE A:  Hypothyroidism in the setting of non compliance. No evidence of acute mixedema coma (normal mental  status, normal sodium, glucose and body temperature). P:   Continue Synthroid  NEUROLOGIC A:  No active issues. P: No interventions required  I have personally obtained a history, examined the patient, evaluated laboratory and imaging results, formulated the assessment and plan and placed orders.  Doree Fudge, MD Pulmonary and [place] Pager: [phone]  12/08/2012, 7:07 AM

## 2012-12-08 NOTE — Progress Notes (Signed)
67 yo female with hx significant for hypertension, chronic kidney disease on transplant list, and uncontrolled hyperthyroidism transferred from [facility] ED for possible temporary percutaneous pacer for new onset bradycardia in 20-30's after recently starting labetalol 200 mg bid.  Pt received atropine, glucagon, calcium chloride and transcutaneous pacing prior to arrival at [facility].    Subjective:  Pt remained in 1st degree AVB with HR 60-70s overnight.  Denies chest pain or shortness of breath but states that she did have one episode of emesis in the early morning. She remains hypertensive with sbp 155-180s.   Objective:  Vital Signs in the last 24 hours: Temp:  [97.5 F (36.4 C)-97.8 F (36.6 C)] 97.8 F (36.6 C) (05/03 0330) Pulse Rate:  [52-84] 73 (05/03 0700) Resp:  [16-35] 19 (05/03 0700) BP: (155-184)/(66-91) 180/79 mmHg (05/03 0700) SpO2:  [94 %-100 %] 100 % (05/03 0700) Weight:  [210 lb 12.2 oz (95.6 kg)-212 lb 1.3 oz (96.2 kg)] 210 lb 12.2 oz (95.6 kg) (05/03 0436)  Intake/Output from previous day: 05/02 0701 - 05/03 0700 In: 571.9 [P.O.:470; I.V.:101.9] Out: 225 [Urine:225]  Physical Exam: Gen: Pt is alert and oriented, NAD, family member at bedside HEENT: normal Neck: JVP - normal, carotids 2+= without bruits Lungs: CTA bilaterally with few scattered crackles, pacer pads in place CV: RRR without murmur or gallop appreciated Abd: soft, obese, NT, Positive BS Ext: no C/C/E, distal pulses intact and equal, SCDs in place Skin: warm/dry no rash   Lab Results:  Recent Labs  12/07/12 2048 12/08/12 0235  WBC 12.3* 10.1  HGB 10.4* 10.8*  PLT 185 220    Recent Labs  12/07/12 2048 12/08/12 0235  NA 136 140  K 5.3* 4.9  CL 102 102  CO2 18* 21  GLUCOSE 102* 110*  BUN 70* 72*  CREATININE 10.28* 10.62*    Recent Labs  12/07/12 2048 12/08/12 0235  TROPONINI <0.30 <0.30    Cardiac Studies: 2D ECHO pending  Tele: 1st degree AVB  Assessment/Plan:    1. Symptomatic Bradycardia: s/p transcutaneous pacing and medical management, currently controlled with HR 70s this morning.  EKG this am demonstrating SR with 1st degree atrioventricular block. Troponins negative x2. -2D ECHO pending today -will continue to monitor    Recent Labs Lab 12/07/12 2048 12/08/12 0235  TROPONINI <0.30 <0.30   2. Hypertension: nitro gtt has been discontinued.  Current management with hydralazine 10 mg IV q4h prn per PCCM. Home regimen of clonidine 0.2 mg bid in addition to labetolol on hold. Pt with documented ACEi and rash side effect to metoprolol.  3. Chronic Kidney Disease with FSGS: Creatinine elevated at 10.62 above most recent baseline of ~9, followed by Dr. [name] of [place] -Renal to follow    Recent Labs Lab 12/07/12 2048 12/08/12 0235  CREATININE 10.28* 10.62*    [name], M.D. 12/08/2012, 8:00 AM  History and all data above reviewed.  Patient examined.  I agree with the findings as above. She is doing well this am.  She has had no pain and she denies any SOB.  The patient exam reveals COR:RRR  ,  Lungs: Bilateral basilar crackles  ,  Abd: Positive bowel sounds, no rebound no guarding, Ext No edema  .  All available labs, radiology testing, previous records reviewed. Agree with documented assessment and plan. Bradycardia:  Resolved with treatment.  Avoiding beta blockers and calcium channel blockers.  HTN:  Start PO hydralazine.  CHF:  Crackles on exam and  edema on CXR.  I will give IV Lasix this am only.  Hold PO Lasix and resume in the AM.    Minus Breeding  9:50 AM  12/08/2012

## 2012-12-08 NOTE — Progress Notes (Signed)
Pt transferred to room 2010 with all personal belongings via bed, monitor, and RN. Report called ahead of time to [employer]. Updates given on arrival. Pt commenting that her nausea was less now. Family with pt on transfer.

## 2012-12-09 DIAGNOSIS — I10 Essential (primary) hypertension: Secondary | ICD-10-CM

## 2012-12-09 LAB — BASIC METABOLIC PANEL
BUN: 78 mg/dL — ABNORMAL HIGH (ref 6–23)
Chloride: 103 mEq/L (ref 96–112)
Creatinine, Ser: 10.33 mg/dL — ABNORMAL HIGH (ref 0.50–1.10)
Glucose, Bld: 101 mg/dL — ABNORMAL HIGH (ref 70–99)
Potassium: 4.1 mEq/L (ref 3.5–5.1)

## 2012-12-09 MED ORDER — AMLODIPINE BESYLATE 10 MG PO TABS: 10.0000 mg | ORAL_TABLET | Freq: Every day | ORAL | Status: DC

## 2012-12-09 MED ORDER — ASPIRIN 81 MG PO TBEC: 81.0000 mg | DELAYED_RELEASE_TABLET | Freq: Every day | ORAL | Status: DC

## 2012-12-09 MED ORDER — HYDRALAZINE HCL 25 MG PO TABS: 25.0000 mg | ORAL_TABLET | Freq: Three times a day (TID) | ORAL | Status: DC

## 2012-12-09 MED ORDER — AMLODIPINE BESYLATE 5 MG PO TABS
5.0000 mg | ORAL_TABLET | Freq: Every day | ORAL | Status: DC
  Administered 2012-12-09: 5 mg via ORAL
  Filled 2012-12-09: qty 1

## 2012-12-09 NOTE — Discharge Summary (Signed)
CARDIOLOGY DISCHARGE SUMMARY    Patient ID: Tabitha Green,  MRN: [id number], DOB/AGE: 1946/02/04 67 y.o.  Admit date: 12/07/2012 Discharge date: 12/09/2012  Primary Care Physician: Unice Cobble, MD Primary Cardiologist: New to [facility]; seen in consultation by Dr. Johnsie Cancel Primary Nephrologist: Erling Cruz, MD  Primary Discharge Diagnosis:  1. Symptomatic bradycardia, in setting of hyperkalemia and AV nodal blocking medications - resolved with potassium correction and off verapamil and labetalol  2. CKD stage V, not yet on HD - scheduled to see Dr. [name] at tomorrow 12/10/2012 at 9 AM to discuss HD 3. Hyperkalemia - improved off ARB  4. Hypothyroidism, TSH >70 - in setting of poor compliance with Synthroid; follow-up this week with PCP  5. HTN - poorly controlled - on furosemide, hydralazine and amlodipine; will probably be better controlled on HD 6. Anemia of chronic disease - renal to follow Hgb/Hct and continue with outpatient EPO  Secondary Discharge Diagnoses:  1. Osteoarthritis 2. History of renal CA  Procedures This Admission:  None   History and Hospital Course:  Tabitha Green is a 67 year old woman with ESRD secondary to FSGS, HTN and hypothyroidism. She was started on Labetalol 5 days ago by Dr. [name] for treatment of uncontrolled HTN. On Wednesday she started having dizziness and on the day of admission her symptoms worsened to the point that she was experiencing near syncope. EMS was called and she was taken to the ED in [place]. She was found to have a HR of 20-30 bpm and external transcutaneous pacing was started. Of note, her TSH was 75.3 in the setting of poor compliance with her Synthroid. There was no evidence of acute mixedematous coma. Her creatinine is elevated and she was hyperkalemic. She was transferred to [facility] for further evaluation and treatment. She received atropine, calcium chloride, glucagon and transcutaneous pacing prior to arrival at [facility]. She was also  treated with hydrocortisone 100 mg and 100 mcg levothyroxine.   Tabitha Green was admitted to [facility] and observed on telemetry. Her heart rate and rhythm stablized off verapamil and labetalol. Her Synthroid was restarted. Her BP was poorly controlled. Hydralazine and amlodipine were added to her regimen. She was also started on Lasix. She was seen in consultation by Nephrology who recommended starting HD. However, Tabitha Green did not want to start HD at this time; therefore, an appointment was scheduled for her to see her primary nephrologist, Dr. [name], on Monday (12/10/2012) to discuss HD. Her heart rate and rhythm remain stable without any recurrent bradycardia. She has been seen, examined and deemed stable for discharge today by Dr. Dorris Carnes.   Discharge Vitals: Blood pressure 186/70, pulse 59, temperature 98.1 F (36.7 C), temperature source Oral, resp. rate 18, height 5' 6.5" (1.689 m), weight 211 lb 6.7 oz (95.9 kg), SpO2 99.00%.   Labs: Lab Results  Component Value Date   WBC 10.1 12/08/2012   HGB 10.8* 12/08/2012   HCT 32.3* 12/08/2012   MCV 92.6 12/08/2012   PLT 220 12/08/2012     Recent Labs Lab 12/09/12 0532  NA 139  K 4.1  CL 103  CO2 22  BUN 78*  CREATININE 10.33*  CALCIUM 8.1*  GLUCOSE 101*   Lab Results  Component Value Date   TROPONINI <0.30 12/08/2012    Lab Results  Component Value Date   CHOL 180 12/08/2012   CHOL 161 02/23/2012   CHOL 175 10/19/2010   Lab Results  Component Value Date   HDL 45 12/08/2012  HDL 36.90* 02/23/2012   HDL 34.10* 10/19/2010   Lab Results  Component Value Date   LDLCALC 123* 12/08/2012   LDLCALC 101* 02/23/2012   LDLCALC 119* 10/19/2010   Lab Results  Component Value Date   TRIG 61 12/08/2012   TRIG 116.0 02/23/2012   TRIG 110.0 10/19/2010   Lab Results  Component Value Date   CHOLHDL 4.0 12/08/2012   CHOLHDL 4 02/23/2012   CHOLHDL 5 10/19/2010    Disposition:  The patient is being discharged in stable condition.  Follow-up:       Follow-up Information   Follow up with Estanislado Emms, MD On 12/10/2012. (At 9:00 AM for hospital follow-up to discuss dialysis)    Contact information:   [name] [address] [phone]      Follow up with Jenkins Rouge, MD On 01/03/2013. (At 11:30 AM for hospital follow-up)    Contact information:   [facility] [id number] [place] [address] [phone]      Follow up with Unice Cobble, MD. Schedule an appointment as soon as possible for a visit in 1 week. (For hospital follow-up; management of thyroid dysfunction)    Contact information:   [address] [place] [address] [phone]      Discharge Medications:    Medication List    STOP taking these medications       allopurinol 100 MG tablet  Commonly known as:  ZYLOPRIM     cloNIDine 0.2 MG tablet  Commonly known as:  CATAPRES     ibuprofen 200 MG tablet  Commonly known as:  ADVIL,MOTRIN     labetalol 200 MG tablet  Commonly known as:  NORMODYNE     verapamil 240 MG CR tablet  Commonly known as:  CALAN-SR      TAKE these medications       acetaminophen 500 MG tablet  Commonly known as:  TYLENOL  Take 500-750 mg by mouth every 6 (six) hours as needed for pain.     amLODipine 10 MG tablet  Commonly known as:  NORVASC  Take 1 tablet (10 mg total) by mouth daily.     aspirin 81 MG EC tablet  Take 1 tablet (81 mg total) by mouth daily.     calcitRIOL 0.5 MCG capsule  Commonly known as:  ROCALTROL  Take 0.5 mcg by mouth daily.     CALCIUM CARBONATE PO  Take 2 tablets by mouth 3 (three) times daily.     epoetin alfa 20000 UNIT/ML injection  Commonly known as:  EPOGEN,PROCRIT  Inject 20,000 Units into the skin every 30 (thirty) days.     furosemide 80 MG tablet  Commonly known as:  LASIX  Take 80 mg by mouth every morning.     hydrALAZINE 25 MG tablet  Commonly known as:  APRESOLINE  Take 1 tablet (25 mg total) by  mouth every 8 (eight) hours.     IRON PO  Take 2 tablets by mouth 2 (two) times daily.     levothyroxine 175 MCG tablet  Commonly known as:  SYNTHROID, LEVOTHROID  Take 175-262.5 mcg by mouth daily before breakfast. 1 tablet daily except 1.5 tablets on Tues and Thurs       Duration of Discharge Encounter: Greater than 30 minutes including physician time.  Signed, Ileene Hutchinson, PA-C 12/09/2012, 1:19 PM

## 2012-12-09 NOTE — Progress Notes (Signed)
Patient ID: Tabitha Green, female   DOB: 06/19/1946, 67 y.o.   MRN: [id number] S:feels better this am and was able to keep breakfast down, however she had 3 episodes of N/V O:BP 186/70  Pulse 59  Temp(Src) 98.1 F (36.7 C) (Oral)  Resp 18  Ht 5' 6.5" (1.689 m)  Wt 95.9 kg (211 lb 6.7 oz)  BMI 33.62 kg/m2  SpO2 99%  Intake/Output Summary (Last 24 hours) at 12/09/12 1009 Last data filed at 12/08/12 1820  Gross per 24 hour  Intake    240 ml  Output      0 ml  Net    240 ml   Intake/Output: I/O last 3 completed shifts: In: 1136.9 [P.O.:1035; I.V.:101.9] Out: 525 [Urine:225; Emesis/NG output:300]  Intake/Output this shift:    Weight change: -0.3 kg (-10.6 oz) Gen:WD obese WF in NAD CVS:no rub, bradycardic at 59 Resp:cta [id number] Ext:no edema, RUE AVF +T/B with large competing vessel along lateral aspect circling down to forearm   Recent Labs Lab 12/07/12 2048 12/08/12 0235 12/09/12 0532  NA 136 140 139  K 5.3* 4.9 4.1  CL 102 102 103  CO2 18* 21 22  GLUCOSE 102* 110* 101*  BUN 70* 72* 78*  CREATININE 10.28* 10.62* 10.33*  CALCIUM 9.7 9.7 8.1*  PHOS  --  6.2*  --    Liver Function Tests: No results found for this basename: AST, ALT, ALKPHOS, BILITOT, PROT, ALBUMIN,  in the last 168 hours No results found for this basename: LIPASE, AMYLASE,  in the last 168 hours No results found for this basename: AMMONIA,  in the last 168 hours CBC:  Recent Labs Lab 12/07/12 2048 12/08/12 0235  WBC 12.3* 10.1  HGB 10.4* 10.8*  HCT 31.2* 32.3*  MCV 93.4 92.6  PLT 185 220   Cardiac Enzymes:  Recent Labs Lab 12/07/12 2048 12/08/12 0235 12/08/12 0824  TROPONINI <0.30 <0.30 <0.30   CBG: No results found for this basename: GLUCAP,  in the last 168 hours  Iron Studies: No results found for this basename: IRON, TIBC, TRANSFERRIN, FERRITIN,  in the last 72 hours Studies/Results: Dg Chest Port 1 View  12/07/2012  *RADIOLOGY REPORT*  Clinical Data: Follow up respiratory  failure  PORTABLE CHEST - 1 VIEW  Comparison: Prior chest x-ray 10/06/2010  Findings: Defibrillator pad projects over the left chest. Cardiomegaly appears progressed compared to prior.  There is increased pulmonary vascular congestion and mild interstitial edema.  No acute osseous abnormality. No large effusion.  No pneumothorax.  IMPRESSION:  Mild - moderate CHF with progression of cardiomegaly compared to 10/06/2010.   Original Report Authenticated By: Jacqulynn Cadet, M.D.    . amLODipine  5 mg Oral Daily  . aspirin EC  81 mg Oral Daily  . ferrous fumarate  1 tablet Oral BID WC  . furosemide  80 mg Oral q morning - 10a  . heparin  5,000 Units Subcutaneous Q8H  . hydrALAZINE  25 mg Oral Q8H  . levothyroxine  175 mcg Oral Custom  . [START ON 12/11/2012] levothyroxine  262.5 mcg Oral Custom  . sodium chloride  3 mL Intravenous Q12H    BMET    Component Value Date/Time   NA 139 12/09/2012 0532   K 4.1 12/09/2012 0532   CL 103 12/09/2012 0532   CO2 22 12/09/2012 0532   GLUCOSE 101* 12/09/2012 0532   BUN 78* 12/09/2012 0532   CREATININE 10.33* 12/09/2012 0532   CALCIUM 8.1* 12/09/2012 0532   CALCIUM  8.4 08/30/2012 0819   GFRNONAA 3* 12/09/2012 0532   GFRAA 4* 12/09/2012 0532   CBC    Component Value Date/Time   WBC 10.1 12/08/2012 0235   RBC 3.49* 12/08/2012 0235   HGB 10.8* 12/08/2012 0235   HCT 32.3* 12/08/2012 0235   PLT 220 12/08/2012 0235   MCV 92.6 12/08/2012 0235   MCH 30.9 12/08/2012 0235   MCHC 33.4 12/08/2012 0235   RDW 14.5 12/08/2012 0235   LYMPHSABS 1.6 09/10/2009 0812   MONOABS 0.4 09/10/2009 0812   EOSABS 0.6 09/10/2009 0812   BASOSABS 0.1 09/10/2009 0812     Assessment/Plan:  1. Symptomatic bradycardia-Pt received atropine, glucagon, calcium chloride and transcutaneous pacing prior to arrival at [facility]. Now with first degree AVB but improved HR off of labetalol. 2. CKD stage 5 not yet on HD. She does not have asterixis but did have several episodes of N/V yesterday.  Possibly early uremia. She has had a  markedly elevated BUN/Cr over the last several months and her Scr has improved with increased HR and off ARB. She does not want to start HD at this time, however, I did discuss with the family that if she has more N/V with lunch we would need to start as she has a very low GFR at baseline.  She is scheduled to see Dr. [name] at [facility] tomorrow but if she is not ready for d/c per Cards, I strongly recommend initiation of HD while she remains an inpt. 3. hyeprkalemia- likely combo of #1 and #2. Improved off of ARB  4. Hypothyroidism- TSH >70. Per primary svc 5. Vascular access- RUE AVF mature and ready for use if needed but has a large competing vessel which will need to be ligated.  Will refer to [facility] as an outpt for evaluation. 6. HTN- poorly controlled. On increased hydralazine and agree with lasix. Would increase amlodipine to 10mg .  Again may be better controlled with HD. 7. ACDz- follow h/h and cont with outp EPO 8. Dispo- Plan per Cardiology, however if she is not stable for d/c today would plan to initiate HD while she is an inpt. 9.   Tabitha Green A

## 2012-12-09 NOTE — Progress Notes (Signed)
Subjective: No CP or SOB  Nausea is gone  Ate breakfast Objective: Filed Vitals:   12/08/12 1434 12/08/12 2017 12/09/12 0431 12/09/12 0700  BP: 161/63 167/73 177/67 186/70  Pulse: 72 88 59   Temp:  99.1 F (37.3 C) 98.1 F (36.7 C)   TempSrc:  Oral Oral   Resp:  18 18   Height:      Weight:    211 lb 6.7 oz (95.9 kg)  SpO2:  97% 99%    Weight change: -10.6 oz (-0.3 kg)  Intake/Output Summary (Last 24 hours) at 12/09/12 0811 Last data filed at 12/08/12 1820  Gross per 24 hour  Intake    565 ml  Output    300 ml  Net    265 ml    General: Alert, awake, oriented x3, in no acute distress Neck:  JVP is normal Heart: Regular rate and rhythm, without murmurs, rubs, gallops.  Lungs: Clear to auscultation.  No rales or wheezes. Exemities:  No edema.   Neuro: Grossly intact, nonfocal.  Tele:  SR 60s to 90s  Lab Results: Results for orders placed during the hospital encounter of 12/07/12 (from the past 24 hour(s))  TROPONIN I     Status: None   Collection Time    12/08/12  8:24 AM      Result Value Range   Troponin I <0.30  <0.30 ng/mL  HEPATITIS B SURFACE ANTIGEN     Status: None   Collection Time    12/08/12 11:16 AM      Result Value Range   Hepatitis B Surface Ag NEGATIVE  NEGATIVE  BASIC METABOLIC PANEL     Status: Abnormal   Collection Time    12/09/12  5:32 AM      Result Value Range   Sodium 139  135 - 145 mEq/L   Potassium 4.1  3.5 - 5.1 mEq/L   Chloride 103  96 - 112 mEq/L   CO2 22  19 - 32 mEq/L   Glucose, Bld 101 (*) 70 - 99 mg/dL   BUN 78 (*) 6 - 23 mg/dL   Creatinine, Ser 10.33 (*) 0.50 - 1.10 mg/dL   Calcium 8.1 (*) 8.4 - 10.5 mg/dL   GFR calc non Af Amer 3 (*) >90 mL/min   GFR calc Af Amer 4 (*) >90 mL/min    Studies/Results: @RISRSLT24 @  Medications:  Reviewed   @PROBHOSP @  1.  BRadycardia  Resolved  No indication for pacer.  Will avoid bblockers and CA blockers.  (labetalol was last b blocker started.  Patient asymptomatic  WIll ambulate  toaday  2.  HTN  BP remains high ON hydralazine 25 tid.  Will add amlodipine to regimen 5 mg.  Verapamil has been d/c'd.  3.  Renal  Renal service is following.  Patient is nearing dialysis.  4.  Thyroid  Keep on supplementation.    Ambulate   If feels OK, she is OK to go home from cardiac standpoint.  LOS: 2 days   Dorris Carnes 12/09/2012, 8:11 AM

## 2012-12-11 ENCOUNTER — Ambulatory Visit: Payer: Medicare Other | Admitting: Internal Medicine

## 2012-12-11 ENCOUNTER — Encounter (HOSPITAL_COMMUNITY): Payer: Self-pay | Admitting: *Deleted

## 2012-12-11 ENCOUNTER — Emergency Department (HOSPITAL_COMMUNITY)
Admission: EM | Admit: 2012-12-11 | Discharge: 2012-12-11 | Disposition: A | Discharge status: Home / Self Care | Payer: Medicare Other | Attending: Emergency Medicine | Admitting: Emergency Medicine

## 2012-12-11 DIAGNOSIS — I129 Hypertensive chronic kidney disease with stage 1 through stage 4 chronic kidney disease, or unspecified chronic kidney disease: Secondary | ICD-10-CM | POA: Insufficient documentation

## 2012-12-11 DIAGNOSIS — N19 Unspecified kidney failure: Secondary | ICD-10-CM

## 2012-12-11 DIAGNOSIS — N189 Chronic kidney disease, unspecified: Secondary | ICD-10-CM | POA: Insufficient documentation

## 2012-12-11 DIAGNOSIS — E039 Hypothyroidism, unspecified: Secondary | ICD-10-CM | POA: Insufficient documentation

## 2012-12-11 DIAGNOSIS — D649 Anemia, unspecified: Secondary | ICD-10-CM | POA: Insufficient documentation

## 2012-12-11 DIAGNOSIS — Z8719 Personal history of other diseases of the digestive system: Secondary | ICD-10-CM | POA: Insufficient documentation

## 2012-12-11 DIAGNOSIS — I10 Essential (primary) hypertension: Secondary | ICD-10-CM

## 2012-12-11 DIAGNOSIS — Z85528 Personal history of other malignant neoplasm of kidney: Secondary | ICD-10-CM | POA: Insufficient documentation

## 2012-12-11 DIAGNOSIS — N390 Urinary tract infection, site not specified: Secondary | ICD-10-CM

## 2012-12-11 DIAGNOSIS — Z8739 Personal history of other diseases of the musculoskeletal system and connective tissue: Secondary | ICD-10-CM | POA: Insufficient documentation

## 2012-12-11 DIAGNOSIS — Z7982 Long term (current) use of aspirin: Secondary | ICD-10-CM | POA: Insufficient documentation

## 2012-12-11 DIAGNOSIS — Z79899 Other long term (current) drug therapy: Secondary | ICD-10-CM | POA: Insufficient documentation

## 2012-12-11 LAB — URINALYSIS, ROUTINE W REFLEX MICROSCOPIC
Ketones, ur: NEGATIVE mg/dL
Nitrite: NEGATIVE
Specific Gravity, Urine: 1.014 (ref 1.005–1.030)
pH: 7.5 (ref 5.0–8.0)

## 2012-12-11 LAB — CBC
HCT: 36.6 % (ref 36.0–46.0)
Hemoglobin: 13 g/dL (ref 12.0–15.0)
MCH: 32.3 pg (ref 26.0–34.0)
MCHC: 35.5 g/dL (ref 30.0–36.0)
RDW: 14.1 % (ref 11.5–15.5)

## 2012-12-11 LAB — COMPREHENSIVE METABOLIC PANEL
Alkaline Phosphatase: 73 U/L (ref 39–117)
BUN: 68 mg/dL — ABNORMAL HIGH (ref 6–23)
Calcium: 8.8 mg/dL (ref 8.4–10.5)
Creatinine, Ser: 9.42 mg/dL — ABNORMAL HIGH (ref 0.50–1.10)
GFR calc Af Amer: 4 mL/min — ABNORMAL LOW (ref >90)
Glucose, Bld: 115 mg/dL — ABNORMAL HIGH (ref 70–99)
Total Protein: 7.2 g/dL (ref 6.0–8.3)

## 2012-12-11 LAB — GLUCOSE, CAPILLARY: Glucose-Capillary: 111 mg/dL — ABNORMAL HIGH (ref 70–99)

## 2012-12-11 LAB — URINE MICROSCOPIC-ADD ON

## 2012-12-11 MED ORDER — CIPROFLOXACIN HCL 250 MG PO TABS: 250.0000 mg | ORAL_TABLET | Freq: Two times a day (BID) | ORAL | Status: DC

## 2012-12-11 NOTE — ED Notes (Signed)
POCT CBG performed; RN notified

## 2012-12-11 NOTE — ED Provider Notes (Signed)
History     CSN: [id number]  Arrival date & time 12/11/12  1222   First MD Initiated Contact with Patient 12/11/12 1309      Chief Complaint  Patient presents with  . Weakness  . Hypertension    (Consider location/radiation/quality/duration/timing/severity/associated sxs/prior treatment) HPI Pt with chronic renal failure recently admitted for symptomatic bradycardia thought to be medication related. Pt had beta blockers D/C and recently started hydralazine. Pt has been afraid to take medication as suggested by her neurologist. Zachery Dakins her BP increased to greater than 200 this am, felt generalized weakness but now has improved. No fever, chills, HA, chest pain, SOB. States she becomes anxious when her BP rises.  Past Medical History  Diagnosis Date  . Hypertension   . Hypothyroidism   . Gout   . Arthritis   . Anemia     r/t kidney function- receives Procrit  . Cancer     kidney  . FSGS (focal segmental glomerulosclerosis)     right kidney;  . Osteopenia   . Diverticulosis     a. 05/2012 colonoscopy  . CKD (chronic kidney disease)     followed by Dr. [name].    Past Surgical History  Procedure Laterality Date  . Total abdominal hysterectomy w/ bilateral salpingoophorectomy      fibroids  . Nephrectomy  1993    for malignancy- left  . Colonoscopy with polypectomy      Dr Henrene Pastor  . Av fistula placement      pt has not had to start dialysis  . Renal biopsy      right  . Colonoscopy      Family History  Problem Relation Age of Onset  . Deep vein thrombosis Mother     post thyroid surgery  . Heart attack Father 32    deceased  . Cancer Sister     breast  . Cancer Paternal Aunt     pancreatic  . Diabetes Maternal Grandfather   . Stroke Paternal Grandmother     in 44s  . Colon cancer Neg Hx   . Esophageal cancer Neg Hx   . Stomach cancer Neg Hx   . Rectal cancer Neg Hx     History  Substance Use Topics  . Smoking status: Never Smoker   . Smokeless  tobacco: Never Used  . Alcohol Use: Yes     Comment: rarely    OB History   Grav Para Term Preterm Abortions TAB SAB Ect Mult Living                  Review of Systems  Constitutional: Positive for fatigue. Negative for fever and chills.  HENT: Negative for neck pain.   Respiratory: Negative for cough and shortness of breath.   Cardiovascular: Negative for chest pain.  Gastrointestinal: Negative for nausea, vomiting, abdominal pain and diarrhea.  Genitourinary: Negative for dysuria and flank pain.  Musculoskeletal: Negative for myalgias and back pain.  Skin: Negative for rash and wound.  Neurological: Negative for dizziness, weakness, numbness and headaches.  All other systems reviewed and are negative.    Allergies  Cefaclor; Naproxen; Enalapril maleate; and Metoprolol tartrate  Home Medications   Current Outpatient Rx  Name  Route  Sig  Dispense  Refill  . acetaminophen (TYLENOL) 500 MG tablet   Oral   Take 500-750 mg by mouth every 6 (six) hours as needed for pain.         [name] amLODipine (NORVASC) 10 MG tablet  Oral   Take 1 tablet (10 mg total) by mouth daily.   30 tablet   3   . aspirin EC 81 MG EC tablet   Oral   Take 1 tablet (81 mg total) by mouth daily.         . calcitRIOL (ROCALTROL) 0.5 MCG capsule   Oral   Take 0.5 mcg by mouth daily.         [name] CALCIUM CARBONATE PO   Oral   Take 2 tablets by mouth 3 (three) times daily.         . furosemide (LASIX) 80 MG tablet   Oral   Take 80 mg by mouth every morning.         . hydrALAZINE (APRESOLINE) 25 MG tablet   Oral   Take 1 tablet (25 mg total) by mouth every 8 (eight) hours.   90 tablet   3   . IRON PO   Oral   Take 2 tablets by mouth 2 (two) times daily.         [name] levothyroxine (SYNTHROID, LEVOTHROID) 175 MCG tablet   Oral   Take 175-262.5 mcg by mouth daily before breakfast. 1 tablet daily except 1.5 tablets on Tues and Thurs         . epoetin alfa (EPOGEN,PROCRIT) 16109  UNIT/ML injection   Subcutaneous   Inject 20,000 Units into the skin every 30 (thirty) days.           BP 179/98  Pulse 87  Temp(Src) 98.1 F (36.7 C) (Oral)  Resp 16  SpO2 100%  Physical Exam  Nursing note and vitals reviewed. Constitutional: She is oriented to person, place, and time. She appears well-developed and well-nourished. No distress.  HENT:  Head: Normocephalic and atraumatic.  Mouth/Throat: Oropharynx is clear and moist.  Eyes: EOM are normal. Pupils are equal, round, and reactive to light.  Neck: Normal range of motion. Neck supple.  Cardiovascular: Normal rate and regular rhythm.   Pulmonary/Chest: Effort normal and breath sounds normal. No respiratory distress. She has no wheezes. She has no rales. She exhibits no tenderness.  Abdominal: Soft. Bowel sounds are normal. She exhibits no distension and no mass. There is no tenderness. There is no rebound and no guarding.  Musculoskeletal: Normal range of motion. She exhibits no edema and no tenderness.  Neurological: She is alert and oriented to person, place, and time.  5/5 motor in all ext, sensation intact  Skin: Skin is warm and dry. No rash noted. No erythema.  Psychiatric: She has a normal mood and affect. Her behavior is normal.    ED Course  Procedures (including critical care time)  Labs Reviewed  CBC - Abnormal; Notable for the following:    WBC 11.3 (*)    All other components within normal limits  COMPREHENSIVE METABOLIC PANEL - Abnormal; Notable for the following:    Potassium 3.4 (*)    Glucose, Bld 115 (*)    BUN 68 (*)    Creatinine, Ser 9.42 (*)    GFR calc non Af Amer 4 (*)    GFR calc Af Amer 4 (*)    All other components within normal limits  URINALYSIS, ROUTINE W REFLEX MICROSCOPIC - Abnormal; Notable for the following:    APPearance CLOUDY (*)    Glucose, UA 100 (*)    Hgb urine dipstick MODERATE (*)    Protein, ur >300 (*)    Leukocytes, UA SMALL (*)    All other  components  within normal limits  GLUCOSE, CAPILLARY - Abnormal; Notable for the following:    Glucose-Capillary 111 (*)    All other components within normal limits  URINE MICROSCOPIC-ADD ON - Abnormal; Notable for the following:    Squamous Epithelial / LPF FEW (*)    Bacteria, UA MANY (*)    All other components within normal limits  URINE CULTURE   No results found.   1. Uncontrolled hypertension   2. UTI (urinary tract infection)   3. Renal failure      Date: 12/11/2012  Rate: 102  Rhythm: sinus tachycardia  QRS Axis: normal  Intervals: normal  ST/T Wave abnormalities: normal  Conduction Disutrbances:none  Narrative Interpretation:   Old EKG Reviewed: unchanged    MDM  Pt given prescribed oral meds in ED.    Encouraged to take meds as prescribed by her MD. Will treat UTI. Advised to f/u with PMD to assure resolution. Given return precautions.      Julianne Rice, MD 12/11/12 [phone]

## 2012-12-11 NOTE — ED Notes (Signed)
Pt was recently hospitalized after having reaction causing her heart rate and bp to go down.  No pt is having weakness all over since today.  Pt states got up dizzy this am and heart rate was racing.  No facial or extremity deficits and no headache.  No chest pain or sob but blood pressure has been elevated all day

## 2012-12-11 NOTE — ED Notes (Signed)
Pt states she took 2 apresoline tablets. EDP approved.

## 2012-12-11 NOTE — ED Notes (Addendum)
Pt states she took her apresoline at 0500 this morning and that she is due to take it again at 1330. Medication reviewed, pt ordered to take every 8 hours.

## 2012-12-11 NOTE — ED Notes (Signed)
Pt states she has a hx of high BP, pt sees her doctor regularly for it. Pt states her MD recently changed her medications. Pt states she has been nauseous for two days. Pt states she feels weak. Pt states she does not feel dizzy at this time. Pt states she just wishes she could throw up right now. Pt states she was clammy this morning.

## 2012-12-12 LAB — URINE CULTURE: Colony Count: NO GROWTH

## 2012-12-13 ENCOUNTER — Other Ambulatory Visit: Payer: Self-pay

## 2012-12-13 ENCOUNTER — Emergency Department (HOSPITAL_COMMUNITY): Payer: Medicare Other

## 2012-12-13 ENCOUNTER — Encounter (HOSPITAL_COMMUNITY): Payer: Self-pay | Admitting: Emergency Medicine

## 2012-12-13 ENCOUNTER — Inpatient Hospital Stay (HOSPITAL_COMMUNITY)
Admission: EM | Admit: 2012-12-13 | Discharge: 2012-12-17 | DRG: 308 | Disposition: A | Discharge status: Home / Self Care | Payer: Medicare Other | Attending: Internal Medicine | Admitting: Internal Medicine

## 2012-12-13 ENCOUNTER — Ambulatory Visit: Payer: Medicare Other | Admitting: Family Medicine

## 2012-12-13 DIAGNOSIS — N184 Chronic kidney disease, stage 4 (severe): Secondary | ICD-10-CM | POA: Diagnosis present

## 2012-12-13 DIAGNOSIS — I471 Supraventricular tachycardia: Secondary | ICD-10-CM | POA: Diagnosis present

## 2012-12-13 DIAGNOSIS — N179 Acute kidney failure, unspecified: Secondary | ICD-10-CM | POA: Diagnosis present

## 2012-12-13 DIAGNOSIS — I10 Essential (primary) hypertension: Secondary | ICD-10-CM | POA: Diagnosis present

## 2012-12-13 DIAGNOSIS — M949 Disorder of cartilage, unspecified: Secondary | ICD-10-CM

## 2012-12-13 DIAGNOSIS — I120 Hypertensive chronic kidney disease with stage 5 chronic kidney disease or end stage renal disease: Secondary | ICD-10-CM | POA: Diagnosis present

## 2012-12-13 DIAGNOSIS — M129 Arthropathy, unspecified: Secondary | ICD-10-CM | POA: Diagnosis present

## 2012-12-13 DIAGNOSIS — M1990 Unspecified osteoarthritis, unspecified site: Secondary | ICD-10-CM

## 2012-12-13 DIAGNOSIS — Z905 Acquired absence of kidney: Secondary | ICD-10-CM

## 2012-12-13 DIAGNOSIS — Z79899 Other long term (current) drug therapy: Secondary | ICD-10-CM

## 2012-12-13 DIAGNOSIS — K5730 Diverticulosis of large intestine without perforation or abscess without bleeding: Secondary | ICD-10-CM | POA: Diagnosis present

## 2012-12-13 DIAGNOSIS — R739 Hyperglycemia, unspecified: Secondary | ICD-10-CM

## 2012-12-13 DIAGNOSIS — M109 Gout, unspecified: Secondary | ICD-10-CM

## 2012-12-13 DIAGNOSIS — E785 Hyperlipidemia, unspecified: Secondary | ICD-10-CM

## 2012-12-13 DIAGNOSIS — E8881 Metabolic syndrome: Secondary | ICD-10-CM

## 2012-12-13 DIAGNOSIS — E039 Hypothyroidism, unspecified: Secondary | ICD-10-CM | POA: Diagnosis present

## 2012-12-13 DIAGNOSIS — N032 Chronic nephritic syndrome with diffuse membranous glomerulonephritis: Secondary | ICD-10-CM

## 2012-12-13 DIAGNOSIS — Z86010 Personal history of colonic polyps: Secondary | ICD-10-CM

## 2012-12-13 DIAGNOSIS — H8110 Benign paroxysmal vertigo, unspecified ear: Secondary | ICD-10-CM

## 2012-12-13 DIAGNOSIS — N186 End stage renal disease: Secondary | ICD-10-CM

## 2012-12-13 DIAGNOSIS — R001 Bradycardia, unspecified: Secondary | ICD-10-CM

## 2012-12-13 DIAGNOSIS — E559 Vitamin D deficiency, unspecified: Secondary | ICD-10-CM | POA: Diagnosis present

## 2012-12-13 DIAGNOSIS — I519 Heart disease, unspecified: Secondary | ICD-10-CM | POA: Diagnosis present

## 2012-12-13 DIAGNOSIS — I498 Other specified cardiac arrhythmias: Principal | ICD-10-CM | POA: Diagnosis present

## 2012-12-13 DIAGNOSIS — N19 Unspecified kidney failure: Secondary | ICD-10-CM | POA: Diagnosis present

## 2012-12-13 DIAGNOSIS — R5381 Other malaise: Secondary | ICD-10-CM

## 2012-12-13 DIAGNOSIS — Z85528 Personal history of other malignant neoplasm of kidney: Secondary | ICD-10-CM

## 2012-12-13 DIAGNOSIS — R7309 Other abnormal glucose: Secondary | ICD-10-CM | POA: Diagnosis present

## 2012-12-13 DIAGNOSIS — D631 Anemia in chronic kidney disease: Secondary | ICD-10-CM | POA: Diagnosis present

## 2012-12-13 DIAGNOSIS — N189 Chronic kidney disease, unspecified: Secondary | ICD-10-CM

## 2012-12-13 DIAGNOSIS — M899 Disorder of bone, unspecified: Secondary | ICD-10-CM

## 2012-12-13 DIAGNOSIS — Z7982 Long term (current) use of aspirin: Secondary | ICD-10-CM

## 2012-12-13 LAB — CBC
HCT: 38.5 % (ref 36.0–46.0)
Hemoglobin: 13.6 g/dL (ref 12.0–15.0)
MCH: 31.8 pg (ref 26.0–34.0)
MCHC: 35.3 g/dL (ref 30.0–36.0)
MCV: 90 fL (ref 78.0–100.0)
Platelets: 358 K/uL (ref 150–400)
RBC: 4.28 MIL/uL (ref 3.87–5.11)
RDW: 14.1 % (ref 11.5–15.5)
WBC: 15.1 K/uL — ABNORMAL HIGH (ref 4.0–10.5)

## 2012-12-13 LAB — POCT I-STAT, CHEM 8
BUN: 67 mg/dL — ABNORMAL HIGH (ref 6–23)
Calcium, Ion: 1.07 mmol/L — ABNORMAL LOW (ref 1.13–1.30)
Creatinine, Ser: 8.8 mg/dL — ABNORMAL HIGH (ref 0.50–1.10)
TCO2: 16 mmol/L (ref 0–100)

## 2012-12-13 LAB — BASIC METABOLIC PANEL WITH GFR
BUN: 68 mg/dL — ABNORMAL HIGH (ref 6–23)
CO2: 14 meq/L — ABNORMAL LOW (ref 19–32)
Calcium: 9.1 mg/dL (ref 8.4–10.5)
Chloride: 103 meq/L (ref 96–112)
Creatinine, Ser: 9.19 mg/dL — ABNORMAL HIGH (ref 0.50–1.10)
GFR calc Af Amer: 5 mL/min — ABNORMAL LOW
GFR calc non Af Amer: 4 mL/min — ABNORMAL LOW
Glucose, Bld: 187 mg/dL — ABNORMAL HIGH (ref 70–99)
Potassium: 4.1 meq/L (ref 3.5–5.1)
Sodium: 138 meq/L (ref 135–145)

## 2012-12-13 LAB — PROTIME-INR
INR: 1.02 (ref 0.00–1.49)
Prothrombin Time: 13.3 s (ref 11.6–15.2)

## 2012-12-13 IMAGING — CR DG CHEST 1V PORT
1 series · 1 of 1 positions shown · non-contrast
Comparison: [DATE]

CLINICAL DATA: Tachycardia

PORTABLE CHEST - 1 VIEW

[AP]
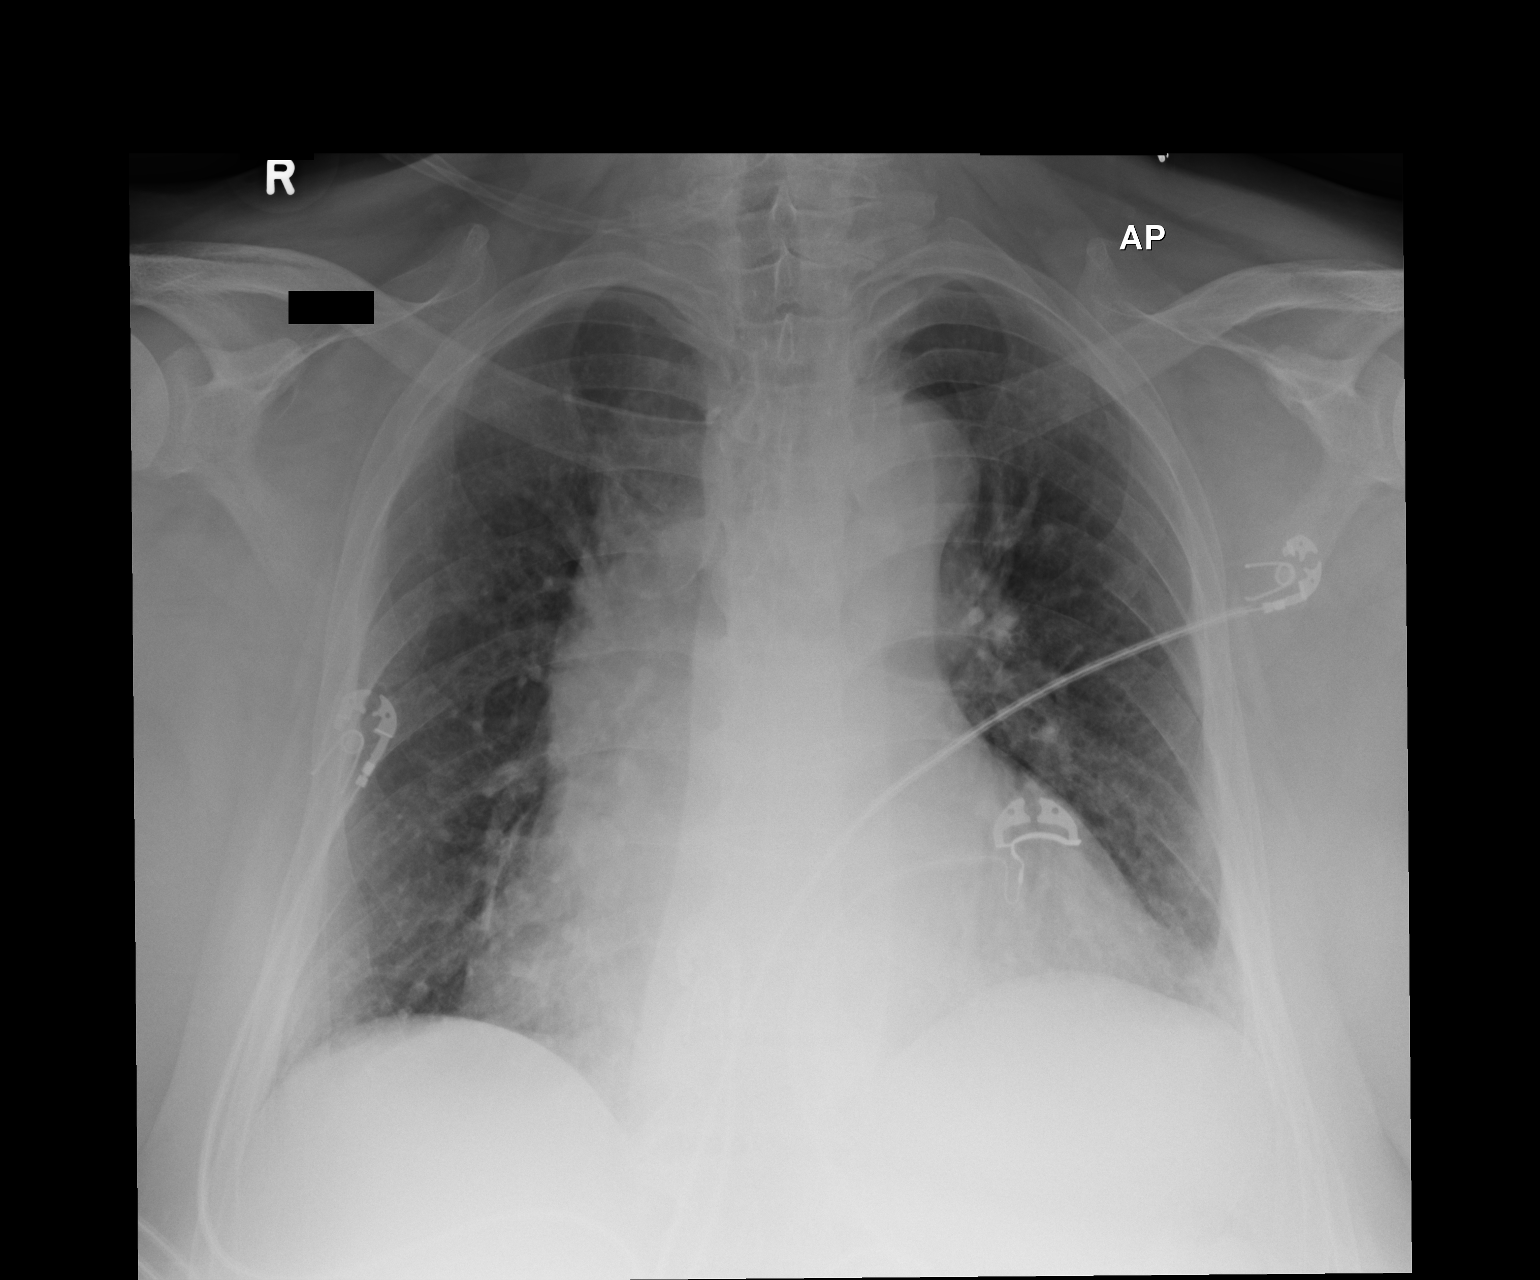

[1 of 1 positions shown; findings below may reference images not displayed]

FINDINGS: Cardiac shadow remains enlarged.  The degree of vascular
congestion has improved.  The lungs are clear.  No pneumothorax or
effusion is seen.
IMPRESSION: Improved vascular congestion.  Stable cardiomegaly.

## 2012-12-13 MED ORDER — NEPRO/CARBSTEADY PO LIQD
237.0000 mL | ORAL | Status: DC | PRN
  Filled 2012-12-13: qty 237

## 2012-12-13 MED ORDER — SODIUM CHLORIDE 0.9 % IV SOLN: 100.0000 mL | INTRAVENOUS | Status: DC | PRN

## 2012-12-13 MED ORDER — ADENOSINE 6 MG/2ML IV SOLN
INTRAVENOUS | Status: AC
  Filled 2012-12-13: qty 2

## 2012-12-13 MED ORDER — HYDRALAZINE HCL 50 MG PO TABS
50.0000 mg | ORAL_TABLET | Freq: Three times a day (TID) | ORAL | Status: DC
  Administered 2012-12-13 – 2012-12-17 (×10): 50 mg via ORAL
  Filled 2012-12-13 (×15): qty 1

## 2012-12-13 MED ORDER — LEVOTHYROXINE SODIUM 175 MCG PO TABS
262.5000 ug | ORAL_TABLET | ORAL | Status: DC
  Filled 2012-12-13: qty 1.5

## 2012-12-13 MED ORDER — ONDANSETRON HCL 4 MG/2ML IJ SOLN: 4.0000 mg | Freq: Four times a day (QID) | INTRAMUSCULAR | Status: DC | PRN

## 2012-12-13 MED ORDER — ENOXAPARIN SODIUM 30 MG/0.3ML ~~LOC~~ SOLN
30.0000 mg | SUBCUTANEOUS | Status: DC
  Administered 2012-12-13 – 2012-12-16 (×3): 30 mg via SUBCUTANEOUS
  Filled 2012-12-13 (×5): qty 0.3

## 2012-12-13 MED ORDER — ADENOSINE 6 MG/2ML IV SOLN
INTRAVENOUS | Status: AC
  Administered 2012-12-13: 6 mg
  Filled 2012-12-13: qty 2

## 2012-12-13 MED ORDER — ASPIRIN EC 81 MG PO TBEC
81.0000 mg | DELAYED_RELEASE_TABLET | Freq: Every day | ORAL | Status: DC
  Administered 2012-12-14 – 2012-12-17 (×4): 81 mg via ORAL
  Filled 2012-12-13 (×4): qty 1

## 2012-12-13 MED ORDER — ACETAMINOPHEN 325 MG PO TABS
650.0000 mg | ORAL_TABLET | Freq: Four times a day (QID) | ORAL | Status: DC | PRN
  Administered 2012-12-14: 650 mg via ORAL

## 2012-12-13 MED ORDER — FUROSEMIDE 80 MG PO TABS
80.0000 mg | ORAL_TABLET | Freq: Every morning | ORAL | Status: DC
  Filled 2012-12-13: qty 1

## 2012-12-13 MED ORDER — LIDOCAINE HCL (PF) 1 % IJ SOLN: 5.0000 mL | INTRAMUSCULAR | Status: DC | PRN

## 2012-12-13 MED ORDER — PENTAFLUOROPROP-TETRAFLUOROETH EX AERO: 1.0000 "application " | INHALATION_SPRAY | CUTANEOUS | Status: DC | PRN

## 2012-12-13 MED ORDER — LEVOTHYROXINE SODIUM 175 MCG PO TABS
175.0000 ug | ORAL_TABLET | ORAL | Status: DC
  Administered 2012-12-14 – 2012-12-17 (×4): 175 ug via ORAL
  Filled 2012-12-13 (×5): qty 1

## 2012-12-13 MED ORDER — ACETAMINOPHEN 325 MG PO TABS
650.0000 mg | ORAL_TABLET | Freq: Four times a day (QID) | ORAL | Status: DC | PRN
  Filled 2012-12-13: qty 2

## 2012-12-13 MED ORDER — ALTEPLASE 2 MG IJ SOLR
2.0000 mg | Freq: Once | INTRAMUSCULAR | Status: AC | PRN
  Filled 2012-12-13: qty 2

## 2012-12-13 MED ORDER — HEPARIN SODIUM (PORCINE) 1000 UNIT/ML DIALYSIS
1000.0000 [IU] | INTRAMUSCULAR | Status: DC | PRN
  Filled 2012-12-13: qty 1

## 2012-12-13 MED ORDER — ADENOSINE 6 MG/2ML IV SOLN
INTRAVENOUS | Status: AC
  Filled 2012-12-13: qty 4

## 2012-12-13 MED ORDER — HEPARIN SODIUM (PORCINE) 1000 UNIT/ML DIALYSIS
2500.0000 [IU] | INTRAMUSCULAR | Status: DC | PRN
  Administered 2012-12-14: 2500 [IU] via INTRAVENOUS_CENTRAL
  Filled 2012-12-13: qty 3

## 2012-12-13 MED ORDER — HYDRALAZINE HCL 20 MG/ML IJ SOLN
10.0000 mg | Freq: Once | INTRAMUSCULAR | Status: AC
  Administered 2012-12-13: 10 mg via INTRAVENOUS

## 2012-12-13 MED ORDER — ADENOSINE 6 MG/2ML IV SOLN
6.0000 mg | Freq: Once | INTRAVENOUS | Status: AC
  Administered 2012-12-13: 6 mg via INTRAVENOUS
  Filled 2012-12-13: qty 4

## 2012-12-13 MED ORDER — LIDOCAINE-PRILOCAINE 2.5-2.5 % EX CREA
1.0000 "application " | TOPICAL_CREAM | CUTANEOUS | Status: DC | PRN
  Administered 2012-12-14: 1 via TOPICAL
  Filled 2012-12-13 (×2): qty 5

## 2012-12-13 MED ORDER — LEVOTHYROXINE SODIUM 175 MCG PO TABS: 175.0000 ug | ORAL_TABLET | Freq: Every day | ORAL | Status: DC

## 2012-12-13 MED ORDER — ADENOSINE 6 MG/2ML IV SOLN
6.0000 mg | Freq: Once | INTRAVENOUS | Status: AC
  Filled 2012-12-13: qty 4

## 2012-12-13 MED ORDER — ADENOSINE 12 MG/4ML IV SOLN
12.0000 mg | Freq: Once | INTRAVENOUS | Status: AC
  Administered 2012-12-13: 12 mg via INTRAVENOUS

## 2012-12-13 MED ORDER — ENOXAPARIN SODIUM 30 MG/0.3ML ~~LOC~~ SOLN: 30.0000 mg | SUBCUTANEOUS | Status: DC

## 2012-12-13 MED ORDER — HYDRALAZINE HCL 20 MG/ML IJ SOLN
10.0000 mg | Freq: Four times a day (QID) | INTRAMUSCULAR | Status: DC | PRN
  Administered 2012-12-14: 10 mg via INTRAVENOUS
  Filled 2012-12-13 (×4): qty 1

## 2012-12-13 MED ORDER — ADENOSINE 6 MG/2ML IV SOLN
6.0000 mg | Freq: Once | INTRAVENOUS | Status: AC
  Administered 2012-12-13: 6 mg via INTRAVENOUS

## 2012-12-13 MED ORDER — VERAPAMIL HCL ER 120 MG PO TBCR
120.0000 mg | EXTENDED_RELEASE_TABLET | Freq: Every day | ORAL | Status: DC
  Administered 2012-12-13 – 2012-12-17 (×5): 120 mg via ORAL
  Filled 2012-12-13 (×5): qty 1

## 2012-12-13 MED ORDER — ACETAMINOPHEN 500 MG PO TABS
500.0000 mg | ORAL_TABLET | Freq: Four times a day (QID) | ORAL | Status: DC | PRN
  Filled 2012-12-13: qty 1.5

## 2012-12-13 MED ORDER — SODIUM CHLORIDE 0.9 % IV SOLN
100.0000 mL | INTRAVENOUS | Status: DC | PRN
  Administered 2012-12-14: 10 mL/h via INTRAVENOUS

## 2012-12-13 MED ORDER — CALCITRIOL 0.5 MCG PO CAPS
0.5000 ug | ORAL_CAPSULE | Freq: Every day | ORAL | Status: DC
  Filled 2012-12-13: qty 1

## 2012-12-13 MED ORDER — AMLODIPINE BESYLATE 10 MG PO TABS
10.0000 mg | ORAL_TABLET | Freq: Every day | ORAL | Status: DC
  Administered 2012-12-15: 10 mg via ORAL
  Filled 2012-12-13 (×3): qty 1

## 2012-12-13 MED ORDER — HYDRALAZINE HCL 20 MG/ML IJ SOLN
INTRAMUSCULAR | Status: AC
  Administered 2012-12-13: 10 mg via INTRAVENOUS
  Filled 2012-12-13: qty 1

## 2012-12-13 NOTE — Consult Note (Signed)
Reason for Consult: Chronic kidney disease stage V-possibly in need to start hemodialysis Referring Physician: Dr. Domenic Green- [facility]  HPI:  [name] with past medical history significant for chronic kidney disease stage V from underlying hypertension and FSGS to a solitary kidney (nephrectomy 1993 for renal cell carcinoma) was set up to start hemodialysis at the [facility] kidney [facility] on Monday (12/17/2012) and is active at the [facility] kidney transplant list.  Admitted with palpitations/SVT earlier today after recent discharge from the hospital with presyncope and symptomatic bradycardia prompting discontinuation of negative inotropes and correction of her hyperkalemia.  She reports that for the last 3-4 days (since seeing Dr. Florene Green on 12/10/12) has been having unrelenting nausea, occasional post meal emesis and difficulty falling asleep. Denies any chest pain or shortness of breath/pedal edema. Does not have any emerging mental status changes. Denies dysgeusia and denies any jerky movements of arms or legs.    Past Medical History  Diagnosis Date  . Hypertension   . Hypothyroidism   . Gout   . Arthritis   . Anemia     r/t kidney function- receives Procrit  . Cancer     kidney  . FSGS (focal segmental glomerulosclerosis)     right kidney;  . Osteopenia   . Diverticulosis     a. 05/2012 colonoscopy  . CKD (chronic kidney disease)     followed by Dr. Florene Green.    Past Surgical History  Procedure Laterality Date  . Total abdominal hysterectomy w/ bilateral salpingoophorectomy      fibroids  . Nephrectomy  1993    for malignancy- left  . Colonoscopy with polypectomy      Dr Tabitha Green  . Av fistula placement      pt has not had to start dialysis  . Renal biopsy      right  . Colonoscopy      Family History  Problem Relation Age of Onset  . Deep vein thrombosis Mother     post thyroid surgery  . Heart attack  Father 35    deceased  . Cancer Sister     breast  . Cancer Paternal Aunt     pancreatic  . Diabetes Maternal Grandfather   . Stroke Paternal Grandmother     in 38s  . Colon cancer Neg Hx   . Esophageal cancer Neg Hx   . Stomach cancer Neg Hx   . Rectal cancer Neg Hx     Social History:  reports that she has never smoked. She has never used smokeless tobacco. She reports that  drinks alcohol. She reports that she does not use illicit drugs.  Allergies:  Allergies  Allergen Reactions  . Cefaclor Rash  . Naproxen Rash  . Enalapril Maleate Cough  . Metoprolol Tartrate Rash    On legs    Medications:  Scheduled: . verapamil  120 mg Oral Daily    Results for orders placed during the hospital encounter of 12/13/12 (from the past 48 hour(s))  CBC     Status: Abnormal   Collection Time    12/13/12  1:13 PM      Result Value Range   WBC 15.1 (*) 4.0 - 10.5 K/uL   RBC 4.28  3.87 - 5.11 MIL/uL   Hemoglobin 13.6  12.0 - 15.0 g/dL   HCT 38.5  36.0 - 46.0 %   MCV 90.0  78.0 - 100.0 fL   MCH 31.8  26.0 - 34.0 pg  MCHC 35.3  30.0 - 36.0 g/dL   RDW 14.1  11.5 - 15.5 %   Platelets 358  150 - 400 K/uL  BASIC METABOLIC PANEL     Status: Abnormal   Collection Time    12/13/12  1:13 PM      Result Value Range   Sodium 138  135 - 145 mEq/L   Potassium 4.1  3.5 - 5.1 mEq/L   Chloride 103  96 - 112 mEq/L   CO2 14 (*) 19 - 32 mEq/L   Glucose, Bld 187 (*) 70 - 99 mg/dL   BUN 68 (*) 6 - 23 mg/dL   Creatinine, Ser 9.19 (*) 0.50 - 1.10 mg/dL   Calcium 9.1  8.4 - 10.5 mg/dL   GFR calc non Af Amer 4 (*) >90 mL/min   GFR calc Af Amer 5 (*) >90 mL/min   Comment:            The eGFR has been calculated     using the CKD EPI equation.     This calculation has not been     validated in all clinical     situations.     eGFR's persistently     <90 mL/min signify     possible Chronic Kidney Disease.  PROTIME-INR     Status: None   Collection Time    12/13/12  1:13 PM      Result  Value Range   Prothrombin Time 13.3  11.6 - 15.2 seconds   INR 1.02  0.00 - 1.49  POCT I-STAT TROPONIN I     Status: Abnormal   Collection Time    12/13/12  1:42 PM      Result Value Range   Troponin i, poc 0.09 (*) 0.00 - 0.08 ng/mL   Comment NOTIFIED PHYSICIAN     Comment 3            Comment: Due to the release kinetics of cTnI,     a negative result within the first hours     of the onset of symptoms does not rule out     myocardial infarction with certainty.     If myocardial infarction is still suspected,     repeat the test at appropriate intervals.  POCT I-STAT, CHEM 8     Status: Abnormal   Collection Time    12/13/12  1:44 PM      Result Value Range   Sodium 140  135 - 145 mEq/L   Potassium 4.0  3.5 - 5.1 mEq/L   Chloride 112  96 - 112 mEq/L   BUN 67 (*) 6 - 23 mg/dL   Creatinine, Ser 8.80 (*) 0.50 - 1.10 mg/dL   Glucose, Bld 184 (*) 70 - 99 mg/dL   Calcium, Ion 1.07 (*) 1.13 - 1.30 mmol/L   TCO2 16  0 - 100 mmol/L   Hemoglobin 14.3  12.0 - 15.0 g/dL   HCT 42.0  36.0 - 46.0 %    Dg Chest Portable 1 View  12/13/2012  *RADIOLOGY REPORT*  Clinical Data: Tachycardia  PORTABLE CHEST - 1 VIEW  Comparison: 12/07/2012  Findings: Cardiac shadow remains enlarged.  The degree of vascular congestion has improved.  The lungs are clear.  No pneumothorax or effusion is seen.  IMPRESSION: Improved vascular congestion.  Stable cardiomegaly.   Original Report Authenticated By: Tabitha Green, M.D.     Review of Systems  Constitutional: Positive for malaise/fatigue. Negative for fever, chills, weight loss and  diaphoresis.  HENT: Negative.   Eyes: Negative.   Respiratory: Negative.   Cardiovascular: Positive for palpitations. Negative for chest pain, orthopnea, claudication, leg swelling and PND.  Gastrointestinal: Positive for nausea and vomiting. Negative for heartburn, abdominal pain, diarrhea, constipation, blood in stool and melena.  Genitourinary: Negative.   Musculoskeletal:  Negative.   Skin: Negative.   Neurological: Positive for weakness. Negative for dizziness, tingling, tremors, sensory change, speech change and focal weakness.  Endo/Heme/Allergies: Negative.   Psychiatric/Behavioral: Negative for depression and suicidal ideas. The patient is nervous/anxious.   All other systems reviewed and are negative.   Blood pressure 191/120, pulse 117, temperature 97.4 F (36.3 C), temperature source Oral, resp. rate 15, SpO2 100.00%. Physical Exam  Nursing note and vitals reviewed. Constitutional: She is oriented to person, place, and time. She appears well-developed and well-nourished. No distress.  HENT:  Head: Normocephalic and atraumatic.  Mouth/Throat: Oropharynx is clear and moist. No oropharyngeal exudate.  Eyes: Conjunctivae and EOM are normal. Pupils are equal, round, and reactive to light. No scleral icterus.  Neck: Normal range of motion. Neck supple. No JVD present. No tracheal deviation present. No thyromegaly present.  Cardiovascular: Normal heart sounds.  Exam reveals no friction rub.   No murmur heard. Regular tachycardia  Respiratory: Effort normal and breath sounds normal. No stridor. No respiratory distress. She has no wheezes. She has no rales. She exhibits no tenderness.  GI: Soft. Bowel sounds are normal. She exhibits no distension. There is no tenderness. There is no rebound and no guarding.  Musculoskeletal: Normal range of motion. She exhibits no edema and no tenderness.  Mature right upper arm arteriovenous fistula  Lymphadenopathy:    She has no cervical adenopathy.  Neurological: She is alert and oriented to person, place, and time. No cranial nerve deficit. Coordination normal.  No asterixis  Skin: Skin is warm and dry. No rash noted. She is not diaphoretic. No erythema.  Psychiatric: She has a normal mood and affect. Her behavior is normal.    Assessment/Plan: 1. Chronic kidney disease stage V: She has subtle uremic symptoms for  which will begin hemodialysis tomorrow while she is here at the hospital. She has also been set up for outpatient hemodialysis beginning Monday that would facilitate discharge over the weekend if possible. No acute hemodialysis needs today. Hepatitis B surface antigen negative. 2. Hypertension: Compliance limited by nausea and inability to tolerate oral medications. Anticipate to improve with starting verapamil as well as dialysis.  3. Supraventricular tachycardia: Ongoing further cardiology evaluation and converted with low dose adenosine. Plans noted for initiation of verapamil. 4. Anemia of chronic kidney disease: Hemoglobin greater than 12, no indications for ESA therapy at this time. 5. Metabolic bone disease: Previously on calcitriol therapy, latest PTH 26 and phosphorus 5.0. Recheck labs tomorrow and consider initiation of binder.   Tabitha Green K. 12/13/2012, 5:16 PM

## 2012-12-13 NOTE — Progress Notes (Cosign Needed)
Tabitha Green Jun 12, 1946 [id number]  Brief narrative:  67 yo female with pmh of CKD due to start HD this admission, htn admitted this afternoon with SVT. She was recently discharged from cardiology service on 5/4 after tx for symptomatic bradycardia. During that hospitalization, she was taken of her CCB and BB. Prior to arrival on floor today, pt was converted to NSR with 6mg  of Adenosine. Called to 3300 this evening for HR sustained in 170's, SVT. Pt denies any chest pain or sob. She is diaphoretic. She did receive Verapamil 120mg  at ~1800 this evening.  Subjective: Denies chest pain, shortness of breath. Does report feeling 'clammy'  Objective: CBC    Component Value Date/Time   WBC 15.1* 12/13/2012 1313   RBC 4.28 12/13/2012 1313   HGB 14.3 12/13/2012 1344   HCT 42.0 12/13/2012 1344   PLT 358 12/13/2012 1313   MCV 90.0 12/13/2012 1313   MCH 31.8 12/13/2012 1313   MCHC 35.3 12/13/2012 1313   RDW 14.1 12/13/2012 1313   LYMPHSABS 1.6 09/10/2009 0812   MONOABS 0.4 09/10/2009 0812   EOSABS 0.6 09/10/2009 0812   BASOSABS 0.1 09/10/2009 0812    BMET    Component Value Date/Time   NA 140 12/13/2012 1344   K 4.0 12/13/2012 1344   CL 112 12/13/2012 1344   CO2 14* 12/13/2012 1313   GLUCOSE 184* 12/13/2012 1344   BUN 67* 12/13/2012 1344   CREATININE 8.80* 12/13/2012 1344   CALCIUM 9.1 12/13/2012 1313   CALCIUM 8.4 08/30/2012 0819   GFRNONAA 4* 12/13/2012 1313   GFRAA 5* 12/13/2012 1313    Filed Vitals:   12/13/12 1615 12/13/12 1959 12/13/12 2115 12/13/12 2135  BP: 191/120 155/98 119/89 115/81  Pulse: 117 112  166  Temp:  98.7 F (37.1 C)    TempSrc:  Oral    Resp: 15 23  22   SpO2: 100% 98%  98%   A/P: 1. Persistent SVT: Has received multiple doses of Adenosine without change. Last dose 12mg  she was in ST for 20-48min and now back in SVT. With her renal issues, there is concern for rebound bradycardia. Will ask cardiology to reconsult. Consider low dose IV verapamil.   Will continue to follow  Patrici Ranks,  NP-C [place]  pgr [phone]  Addendum: After discussion with cardiology, will transfer pt to unit and initiate Esmolol gtt with hopes to discontinue after 2 hours. Continue to monitor. Pt and family updated.

## 2012-12-13 NOTE — ED Notes (Signed)
Results of troponin shown to Dr. Alvino Chapel

## 2012-12-13 NOTE — ED Notes (Signed)
HR 115 ST. EKG obtained

## 2012-12-13 NOTE — H&P (Addendum)
[facility] History and Physical  Tabitha Green [id number] DOB: 11-Mar-1946 DOA: 12/13/2012  Referring physician: EDP PCP: Unice Cobble, MD  Specialists: Jenkins Rouge, MD  Chief Complaint:   HPI: Tabitha Green is a 67 y.o. female with PMH of CKD about to start HD, HTN, she was just discharged from [facility] on 5/4 after admission to the cardiology service with Symptomatic sinus bradycardia and was taken off CCB and BB. She went to see Dr.Powel with [facility] and was set up to start HD on Monday. She has noted symptoms of nausea, anorexia, occasional vomiting for 4-5 days. Today she felt that her heart was racing, clammy, lightheaded  And came to the ER. She denies any CP/SoB Upon evaluation in ER she was noted to have SVT in 120s-130s and was given Adenosine   Review of Systems:  The patient denies anorexia, fever, weight loss,, vision loss, decreased hearing, hoarseness, chest pain, syncope, dyspnea on exertion, peripheral edema, balance deficits, hemoptysis, abdominal pain, melena, hematochezia, severe indigestion/heartburn, hematuria, incontinence, genital sores, muscle weakness, suspicious skin lesions, transient blindness, difficulty walking, depression, unusual weight change, abnormal bleeding, enlarged lymph nodes, angioedema, and breast masses.    Past Medical History  Diagnosis Date  . Hypertension   . Hypothyroidism   . Gout   . Arthritis   . Anemia     r/t kidney function- receives Procrit  . Cancer     kidney  . FSGS (focal segmental glomerulosclerosis)     right kidney;  . Osteopenia   . Diverticulosis     a. 05/2012 colonoscopy  . CKD (chronic kidney disease)     followed by Dr. [name].   Past Surgical History  Procedure Laterality Date  . Total abdominal hysterectomy w/ bilateral salpingoophorectomy      fibroids  . Nephrectomy  1993    for malignancy- left  . Colonoscopy with polypectomy      Dr Henrene Pastor  . Av fistula placement      pt has not had to start  dialysis  . Renal biopsy      right  . Colonoscopy     Social History:  reports that she has never smoked. She has never used smokeless tobacco. She reports that  drinks alcohol. She reports that she does not use illicit drugs. Lives at home with husband  Allergies  Allergen Reactions  . Cefaclor Rash  . Naproxen Rash  . Enalapril Maleate Cough  . Metoprolol Tartrate Rash    On legs    Family History  Problem Relation Age of Onset  . Deep vein thrombosis Mother     post thyroid surgery  . Heart attack Father 19    deceased  . Cancer Sister     breast  . Cancer Paternal Aunt     pancreatic  . Diabetes Maternal Grandfather   . Stroke Paternal Grandmother     in 28s  . Colon cancer Neg Hx   . Esophageal cancer Neg Hx   . Stomach cancer Neg Hx   . Rectal cancer Neg Hx     Prior to Admission medications   Medication Sig Start Date End Date Taking? Authorizing Provider  acetaminophen (TYLENOL) 500 MG tablet Take 500-750 mg by mouth every 6 (six) hours as needed for pain.   Yes Historical Provider, MD  amLODipine (NORVASC) 10 MG tablet Take 1 tablet (10 mg total) by mouth daily. 12/09/12  Yes Brooke O Edmisten, PA-C  aspirin EC 81 MG EC tablet Take  1 tablet (81 mg total) by mouth daily. 12/09/12  Yes Brooke O Edmisten, PA-C  calcitRIOL (ROCALTROL) 0.5 MCG capsule Take 0.5 mcg by mouth daily.   Yes Historical Provider, MD  ciprofloxacin (CIPRO) 250 MG tablet Take 1 tablet (250 mg total) by mouth every 12 (twelve) hours. 12/11/12  Yes Julianne Rice, MD  furosemide (LASIX) 80 MG tablet Take 80 mg by mouth every morning.   Yes Historical Provider, MD  hydrALAZINE (APRESOLINE) 25 MG tablet Take 50 mg by mouth every 8 (eight) hours. 12/09/12  Yes Brooke O Edmisten, PA-C  IRON PO Take 2 tablets by mouth 2 (two) times daily.   Yes Historical Provider, MD  levothyroxine (SYNTHROID, LEVOTHROID) 175 MCG tablet Take 175-262.5 mcg by mouth daily before breakfast. 1 tablet daily except 1.5 tablets  on Tues and Thurs   Yes Historical Provider, MD  CALCIUM CARBONATE PO Take 2 tablets by mouth 3 (three) times daily.    Historical Provider, MD  epoetin alfa (EPOGEN,PROCRIT) 02725 UNIT/ML injection Inject 20,000 Units into the skin every 30 (thirty) days.    Historical Provider, MD   Physical Exam: Filed Vitals:   12/13/12 1415 12/13/12 1445 12/13/12 1500 12/13/12 1615  BP: 162/96 148/97 133/88 191/120  Pulse: 117 122 123 117  Temp:      TempSrc:      Resp: 17 20 22 15   SpO2: 100% 100% 100% 100%    General: Well developed, well nourished, female in no acute distress  Head: Eyes PERRLA, No xanthomas. Normocephalic and atraumatic, oropharynx without edema or exudate. Dentition: poor  Lungs: rales bases, no crackles or wheeze  Heart: HRRR S1 S2, no rub/gallop, faint murmur. pulses are 2+ all 4 extrem.  Neck: Bilateral carotid bruits. No lymphadenopathy. JVD approx 7 cm.  Abdomen: Bowel sounds present, abdomen soft and non-tender without masses or hernias noted.  Msk: No spine or cva tenderness. No weakness, no joint deformities or effusions.  Extremities: No clubbing or cyanosis. No edema. , R arm with AVG Neuro: Alert and oriented X 3. No focal deficits noted.  Psych: Good affect, responds appropriately  Skin: No rashes or lesions noted.   Labs on Admission:  Basic Metabolic Panel:  Recent Labs Lab 12/07/12 2048 12/07/12 2326 12/08/12 0235 12/09/12 0532 12/11/12 1234 12/13/12 1313 12/13/12 1344  NA 136  --  140 139 137 138 140  K 5.3*  --  4.9 4.1 3.4* 4.1 4.0  CL 102  --  102 103 101 103 112  CO2 18*  --  21 22 19  14*  --   GLUCOSE 102*  --  110* 101* 115* 187* 184*  BUN 70*  --  72* 78* 68* 68* 67*  CREATININE 10.28*  --  10.62* 10.33* 9.42* 9.19* 8.80*  CALCIUM 9.7  --  9.7 8.1* 8.8 9.1  --   MG  --  2.4  --   --   --   --   --   PHOS  --   --  6.2*  --   --   --   --    Liver Function Tests:  Recent Labs Lab 12/11/12 1234  AST 24  ALT 29  ALKPHOS 73   BILITOT 0.3  PROT 7.2  ALBUMIN 3.5   No results found for this basename: LIPASE, AMYLASE,  in the last 168 hours No results found for this basename: AMMONIA,  in the last 168 hours CBC:  Recent Labs Lab 12/07/12 2048 12/08/12 0235 12/11/12 1234  12/13/12 1313 12/13/12 1344  WBC 12.3* 10.1 11.3* 15.1*  --   HGB 10.4* 10.8* 13.0 13.6 14.3  HCT 31.2* 32.3* 36.6 38.5 42.0  MCV 93.4 92.6 91.0 90.0  --   PLT 185 220 274 358  --    Cardiac Enzymes:  Recent Labs Lab 12/07/12 2048 12/08/12 0235 12/08/12 0824  TROPONINI <0.30 <0.30 <0.30    BNP (last 3 results) No results found for this basename: PROBNP,  in the last 8760 hours CBG:  Recent Labs Lab 12/11/12 1244  GLUCAP 111*    Radiological Exams on Admission: Dg Chest Portable 1 View  12/13/2012  *RADIOLOGY REPORT*  Clinical Data: Tachycardia  PORTABLE CHEST - 1 VIEW  Comparison: 12/07/2012  Findings: Cardiac shadow remains enlarged.  The degree of vascular congestion has improved.  The lungs are clear.  No pneumothorax or effusion is seen.  IMPRESSION: Improved vascular congestion.  Stable cardiomegaly.   Original Report Authenticated By: [name] Catalina, M.D.      Assessment/Plan  1. SVT -converted to NSR after Adenosine-low dose -cards following, start Verapamil 120mg  daily -monitor on Tele -check 2 D ECHO -continue home dose of synthroid  2. CKD progressive with symptoms of Uremia: -Renal consulted, may need HD resumed sooner -has AVG, was due to start HD on Monday -Volume status good and K normal  3. HTN:  -continue amlodipine, hydralazine, lasix -Verapamil started today per Cards -will improve after HD started  4. Hyperglycemia: -SSI for now -check HbAIC  5. ? Recent UTI U CX negative, DC cipro  DVt proph: lovenox  Code Status: FULL Family Communication: d/w pt and husband at bedside Disposition Plan: Home   Time spent: 32min  Tabitha Green [facility] Pager [phone]  If 7PM-7AM,  please contact night-coverage [email or web address] Password [facility] 12/13/2012, 4:53 PM

## 2012-12-13 NOTE — ED Notes (Addendum)
Pt appears pale, states I feel clammy.

## 2012-12-13 NOTE — ED Provider Notes (Signed)
History     CSN: [id number]  Arrival date & time 12/13/12  1300   First MD Initiated Contact with Patient 12/13/12 1314      Chief Complaint  Patient presents with  . Tachycardia    (Consider location/radiation/quality/duration/timing/severity/associated sxs/prior treatment) The history is provided by the patient.   patient has end-stage renal disease and is due to start dialysis for the first time on Monday. She states today she began having shortness of breath and feeling her heart race. No chest pain. She was found to be tachycardic. She's currently on antibiotics for a urinary tract infection. No fevers. No dominant. No nausea vomiting or diarrhea. Patient feels fatigued.  Past Medical History  Diagnosis Date  . Hypertension   . Hypothyroidism   . Gout   . Arthritis   . Anemia     r/t kidney function- receives Procrit  . Cancer     kidney  . FSGS (focal segmental glomerulosclerosis)     right kidney;  . Osteopenia   . Diverticulosis     a. 05/2012 colonoscopy  . CKD (chronic kidney disease)     followed by Dr. [name].    Past Surgical History  Procedure Laterality Date  . Total abdominal hysterectomy w/ bilateral salpingoophorectomy      fibroids  . Nephrectomy  1993    for malignancy- left  . Colonoscopy with polypectomy      Dr Henrene Pastor  . Av fistula placement      pt has not had to start dialysis  . Renal biopsy      right  . Colonoscopy      Family History  Problem Relation Age of Onset  . Deep vein thrombosis Mother     post thyroid surgery  . Heart attack Father 32    deceased  . Cancer Sister     breast  . Cancer Paternal Aunt     pancreatic  . Diabetes Maternal Grandfather   . Stroke Paternal Grandmother     in 64s  . Colon cancer Neg Hx   . Esophageal cancer Neg Hx   . Stomach cancer Neg Hx   . Rectal cancer Neg Hx     History  Substance Use Topics  . Smoking status: Never Smoker   . Smokeless tobacco: Never Used  . Alcohol Use: Yes      Comment: rarely    OB History   Grav Para Term Preterm Abortions TAB SAB Ect Mult Living                  Review of Systems  Constitutional: Positive for fatigue. Negative for activity change and appetite change.  HENT: Negative for neck stiffness.   Eyes: Negative for pain.  Respiratory: Positive for shortness of breath. Negative for chest tightness.   Cardiovascular: Positive for palpitations. Negative for chest pain and leg swelling.  Gastrointestinal: Negative for nausea, vomiting, abdominal pain and diarrhea.  Genitourinary: Negative for flank pain.  Musculoskeletal: Negative for back pain.  Skin: Negative for rash.  Neurological: Positive for light-headedness. Negative for weakness, numbness and headaches.  Psychiatric/Behavioral: Negative for behavioral problems.    Allergies  Cefaclor; Naproxen; Enalapril maleate; and Metoprolol tartrate  Home Medications   Current Outpatient Rx  Name  Route  Sig  Dispense  Refill  . acetaminophen (TYLENOL) 500 MG tablet   Oral   Take 500-750 mg by mouth every 6 (six) hours as needed for pain.         [name]  amLODipine (NORVASC) 10 MG tablet   Oral   Take 1 tablet (10 mg total) by mouth daily.   30 tablet   3   . aspirin EC 81 MG EC tablet   Oral   Take 1 tablet (81 mg total) by mouth daily.         . calcitRIOL (ROCALTROL) 0.5 MCG capsule   Oral   Take 0.5 mcg by mouth daily.         . ciprofloxacin (CIPRO) 250 MG tablet   Oral   Take 1 tablet (250 mg total) by mouth every 12 (twelve) hours.   14 tablet   0   . furosemide (LASIX) 80 MG tablet   Oral   Take 80 mg by mouth every morning.         . hydrALAZINE (APRESOLINE) 25 MG tablet   Oral   Take 50 mg by mouth every 8 (eight) hours.         . IRON PO   Oral   Take 2 tablets by mouth 2 (two) times daily.         [name] levothyroxine (SYNTHROID, LEVOTHROID) 175 MCG tablet   Oral   Take 175-262.5 mcg by mouth daily before breakfast. 1 tablet daily  except 1.5 tablets on Tues and Thurs         . CALCIUM CARBONATE PO   Oral   Take 2 tablets by mouth 3 (three) times daily.         [name] epoetin alfa (EPOGEN,PROCRIT) 35573 UNIT/ML injection   Subcutaneous   Inject 20,000 Units into the skin every 30 (thirty) days.           BP 133/88  Pulse 123  Temp(Src) 97.4 F (36.3 C) (Oral)  Resp 22  SpO2 100%  Physical Exam  Nursing note and vitals reviewed. Constitutional: She is oriented to person, place, and time. She appears well-developed and well-nourished.  HENT:  Head: Normocephalic and atraumatic.  Eyes: EOM are normal. Pupils are equal, round, and reactive to light.  Neck: Normal range of motion. Neck supple.  Cardiovascular: Regular rhythm and normal heart sounds.   No murmur heard. Tachycardia  Pulmonary/Chest: Effort normal and breath sounds normal. No respiratory distress. She has no wheezes. She has no rales.  Abdominal: Soft. Bowel sounds are normal. She exhibits no distension. There is no tenderness. There is no rebound and no guarding.  Musculoskeletal: Normal range of motion.  Dialysis graft right upper extremity  Neurological: She is alert and oriented to person, place, and time. No cranial nerve deficit.  Skin: Skin is warm and dry.  Psychiatric: She has a normal mood and affect. Her speech is normal.    ED Course  Procedures (including critical care time)  Labs Reviewed  CBC - Abnormal; Notable for the following:    WBC 15.1 (*)    All other components within normal limits  BASIC METABOLIC PANEL - Abnormal; Notable for the following:    CO2 14 (*)    Glucose, Bld 187 (*)    BUN 68 (*)    Creatinine, Ser 9.19 (*)    GFR calc non Af Amer 4 (*)    GFR calc Af Amer 5 (*)    All other components within normal limits  POCT I-STAT, CHEM 8 - Abnormal; Notable for the following:    BUN 67 (*)    Creatinine, Ser 8.80 (*)    Glucose, Bld 184 (*)    Calcium, Ion 1.07 (*)  All other components within  normal limits  POCT I-STAT TROPONIN I - Abnormal; Notable for the following:    Troponin i, poc 0.09 (*)    All other components within normal limits  PROTIME-INR  PRO B NATRIURETIC PEPTIDE   Dg Chest Portable 1 View  12/13/2012  *RADIOLOGY REPORT*  Clinical Data: Tachycardia  PORTABLE CHEST - 1 VIEW  Comparison: 12/07/2012  Findings: Cardiac shadow remains enlarged.  The degree of vascular congestion has improved.  The lungs are clear.  No pneumothorax or effusion is seen.  IMPRESSION: Improved vascular congestion.  Stable cardiomegaly.   Original Report Authenticated By: [name] Catalina, M.D.      1. SVT (supraventricular tachycardia)   2. ESRF (end stage renal failure)       MDM  Patient presents in SVT. She converted back to sinus rhythm with 6 mg of Cardizem. She recently had her AV nodal agents stopped due 2 bradycardia. She's due for dialysis also. She does not appear to fluid overloaded. She'll be seen by cardiology for possible medication adjustment. Patient feels better after conversion.        Jasper Riling. Alvino Chapel, MD 12/13/12 [id number]

## 2012-12-13 NOTE — ED Notes (Signed)
Pt here for tachycardia, weakness and diaphoresis; pt denies CP or SOB; pt noted to be tachycardic with weak radial pulse

## 2012-12-13 NOTE — Progress Notes (Signed)
Pt arrived from ED, VSS. Pt heart rate shot up to 170s, BP 170s sys, called DR. New order for hydralazine IVP. Rate improved. Will continue to monitor.

## 2012-12-13 NOTE — ED Notes (Signed)
Cardiology at bedside.

## 2012-12-13 NOTE — Consult Note (Signed)
CARDIOLOGY CONSULT NOTE   Patient ID: Tabitha Green MRN: [id number] DOB/AGE: [birth year] 67 y.o.  Admit date: 12/13/2012  Primary Physician   Unice Cobble, MD Primary Cardiologist   PN  Reason for Consultation   SVT  [id number] Tabitha Green is a 67 y.o. female with no history of CAD.  She was recently hospitalized with symptomatic bradycardia from 5/2-5/4. She had been on Verapamil and Clonidine for a long time without problems. She had labetalol added to her medication regimen for better BP control but had presyncope and an idioventricular rhythm. Her TSH was elevated as well but her free T4 was WNL so no dose change on the Synthroid was made and she was to follow up with her primary MD. Her K+, BUN/Cr were very high 5.3, 70 and 10.62 respectively. She received Kayexalate and was taken off her ARB and allopurinol. She is to start HD next week.   Since d/c, she has had problems with general malaise, nausea and some vomiting, especially at night. No appetite. No chest pain and she occasionally felt her heart pound but no HR taken. This would happen with exertion and resolve with rest. She came to the ER 5/6 and was dx with UTI. Her VS were BP 179/98, HR 87. She has taken Cipro as prescribed but with no improvement in her symptoms.   Today, she felt clammy in addition to her other symptoms. A friend persuaded her to come to the ER and her HR was 178, SVT. She received Adenosine 6 mg and converted to SR/ST. The clammy feeling resolved and there was no change in her other symptoms.   Past Medical History  Diagnosis Date  . Hypertension   . Hypothyroidism   . Gout   . Arthritis   . Anemia     r/t kidney function- receives Procrit  . Cancer     kidney  . FSGS (focal segmental glomerulosclerosis)     right kidney;  . Osteopenia   . Diverticulosis     a. 05/2012 colonoscopy  . CKD (chronic kidney disease)     followed by Dr. [name].     Past Surgical History  Procedure Laterality Date    . Total abdominal hysterectomy w/ bilateral salpingoophorectomy      fibroids  . Nephrectomy  1993    for malignancy- left  . Colonoscopy with polypectomy      Dr Henrene Pastor  . Av fistula placement      pt has not had to start dialysis  . Renal biopsy      right  . Colonoscopy      Allergies  Allergen Reactions  . Cefaclor Rash  . Naproxen Rash  . Enalapril Maleate Cough  . Metoprolol Tartrate Rash    On legs    I have reviewed the patient's current medications Medication Sig  acetaminophen (TYLENOL) 500 MG tablet Take 500-750 mg by mouth every 6 (six) hours as needed for pain.  amLODipine (NORVASC) 10 MG tablet Take 1 tablet (10 mg total) by mouth daily.  aspirin EC 81 MG EC tablet Take 1 tablet (81 mg total) by mouth daily.  calcitRIOL (ROCALTROL) 0.5 MCG capsule Take 0.5 mcg by mouth daily.  ciprofloxacin (CIPRO) 250 MG tablet Take 1 tablet (250 mg total) by mouth every 12 (twelve) hours.  furosemide (LASIX) 80 MG tablet Take 80 mg by mouth every morning.  hydrALAZINE (APRESOLINE) 25 MG tablet Take 50 mg by mouth every 8 (eight) hours.  IRON PO Take  2 tablets by mouth 2 (two) times daily.  levothyroxine (SYNTHROID, LEVOTHROID) 175 MCG tablet Take 175-262.5 mcg by mouth daily before breakfast. 1 tablet daily except 1.5 tablets on Tues and Thurs  CALCIUM CARBONATE PO Take 2 tablets by mouth 3 (three) times daily.  epoetin alfa (EPOGEN,PROCRIT) 16109 UNIT/ML injection Inject 20,000 Units into the skin every 30 (thirty) days.     History   Social History  . Marital Status: Married    Spouse Name: N/A    Number of Children: N/A  . Years of Education: N/A   Occupational History  . Not on file.   Social History Main Topics  . Smoking status: Never Smoker   . Smokeless tobacco: Never Used  . Alcohol Use: Yes     Comment: rarely  . Drug Use: No  . Sexually Active:    Other Topics Concern  . Not on file   Social History Narrative   Lives in [place] with husband.   Retired.  Previously worked in [employer] @ [employer]    Family Status  Relation Status Death Age  . Mother Deceased   . Father Deceased    Family History  Problem Relation Age of Onset  . Deep vein thrombosis Mother     post thyroid surgery  . Heart attack Father 76    deceased  . Cancer Sister     breast  . Cancer Paternal Aunt     pancreatic  . Diabetes Maternal Grandfather   . Stroke Paternal Grandmother     in 52s  . Colon cancer Neg Hx   . Esophageal cancer Neg Hx   . Stomach cancer Neg Hx   . Rectal cancer Neg Hx      ROS:  Full 14 point review of systems complete and found to be negative unless listed above.  Physical Exam: Blood pressure 133/88, pulse 123, temperature 97.4 F (36.3 C), temperature source Oral, resp. rate 22, SpO2 100.00%.  General: Well developed, well nourished, female in no acute distress Head: Eyes PERRLA, No xanthomas.   Normocephalic and atraumatic, oropharynx without edema or exudate. Dentition: poor Lungs: rales bases, no crackles or wheeze Heart: HRRR S1 S2, no rub/gallop,  faint murmur. pulses are 2+ all 4 extrem.   Neck: Bilateral carotid bruits. No lymphadenopathy.  JVD approx 7 cm. Abdomen: Bowel sounds present, abdomen soft and non-tender without masses or hernias noted. Msk:  No spine or cva tenderness. No weakness, no joint deformities or effusions. Extremities: No clubbing or cyanosis. No edema.  Neuro: Alert and oriented X 3. No focal deficits noted. Psych:  Good affect, responds appropriately Skin: No rashes or lesions noted.  Labs:   Lab Results  Component Value Date   WBC 15.1* 12/13/2012   HGB 14.3 12/13/2012   HCT 42.0 12/13/2012   MCV 90.0 12/13/2012   PLT 358 12/13/2012    Recent Labs  12/13/12 1313  INR 1.02     Recent Labs Lab 12/11/12 1234 12/13/12 1313 12/13/12 1344  NA 137 138 140  K 3.4* 4.1 4.0  CL 101 103 112  CO2 19 14*  --   BUN 68* 68* 67*  CREATININE 9.42* 9.19* 8.80*  CALCIUM 8.8 9.1  --   PROT  7.2  --   --   BILITOT 0.3  --   --   ALKPHOS 73  --   --   ALT 29  --   --   AST 24  --   --  GLUCOSE 115* 187* 184*   Magnesium  Date Value Range Status  12/07/2012 2.4  1.5 - 2.5 mg/dL Final   No results found for this basename: CKTOTAL, CKMB, TROPONINI,  in the last 72 hours  Recent Labs  12/13/12 1342  TROPIPOC 0.09*   No results found for this basename: probnp   TSH  Date/Time Value Range Status  12/07/2012  8:48 PM 66.010* 0.350 - 4.500 uIU/mL Final   Echo: Not done  ECG: 13-Dec-2012 13:25:04 SUPRAVENTRICULAR TACHYCARDIA ~ V-rate>(220-age), QRSd<120 LVH WITH SECONDARY REPOLARIZATION ABNORMALITY ~ R56L/RISIII/S12R56/S3RL & rep abn ST DEPRESSION, PROBABLY RATE RELATED ~ ST <-0.48mV & extreme tachycardia Vent. rate 166 BPM PR interval * ms QRS duration 90 ms QT/QTc 312/518 ms P-R-T axes 0 24 215   13-Dec-2012 13:29:49 SINUS TACHYCARDIA ~ V-rate> 99 LVH WITH SECONDARY REPOLARIZATION ABNORMALITY ~ R56L/RISIII/S12R56/S3RL & rep abn Vent. rate 115 BPM PR interval 188 ms QRS duration 90 ms QT/QTc 328/454 ms P-R-T axes 106 8 -6  Radiology:  Dg Chest Portable 1 View 12/13/2012  *RADIOLOGY REPORT*  Clinical Data: Tachycardia  PORTABLE CHEST - 1 VIEW  Comparison: 12/07/2012  Findings: Cardiac shadow remains enlarged.  The degree of vascular congestion has improved.  The lungs are clear.  No pneumothorax or effusion is seen.  IMPRESSION: Improved vascular congestion.  Stable cardiomegaly.   Original Report Authenticated By: [name] Catalina, M.D.     ASSESSMENT AND PLAN:   The patient was seen today by Dr Percival Spanish, the patient evaluated and the data reviewed.  Principal Problem:   SVT (supraventricular tachycardia) - She converted with a low dose of Adenosine. She had no clear awareness of her arrhythmia except feeling clammy. This resolved once she went back into SR/ST. Her WBCs are higher than on 5/6 and her HR is still elevated (but sinus). She is being compliant with her  medications, including the antibiotics. MD advise if any further cardiac workup is indicated at this time.     Hypertension - resting HR is elevated, MD advise on med changes. Control is poor even though pt is compliant with meds.   SignedRosaria Ferries, PA-C 12/13/2012 3:42 PM Beeper [id number]  Co-Sign MD  History and all data above reviewed.  Patient examined.  I agree with the findings as above.  The patient exam reveals COR:RRR  ,  Lungs: Clear  ,  Abd: Positive bowel sounds, no rebound no guarding, Ext no edema  .  All available labs, radiology testing, previous records reviewed. Agree with documented assessment and plan. SVT:  I think she will likely tolerate reinitiation of a low dose verapamil.  We will start with 120 mg daily.  Echo was pending at last appt and this can be reordered this admit.  HTN:  This can be managed in the context of treating the tachycardia SVT.  Also, she will be starting dialysis soon which might change her BP.   Jeneen Rinks Lum Stillinger  4:15 PM  12/13/2012

## 2012-12-14 DIAGNOSIS — R739 Hyperglycemia, unspecified: Secondary | ICD-10-CM | POA: Diagnosis present

## 2012-12-14 DIAGNOSIS — R7309 Other abnormal glucose: Secondary | ICD-10-CM

## 2012-12-14 DIAGNOSIS — I498 Other specified cardiac arrhythmias: Principal | ICD-10-CM

## 2012-12-14 DIAGNOSIS — N032 Chronic nephritic syndrome with diffuse membranous glomerulonephritis: Secondary | ICD-10-CM

## 2012-12-14 DIAGNOSIS — I517 Cardiomegaly: Secondary | ICD-10-CM

## 2012-12-14 DIAGNOSIS — I10 Essential (primary) hypertension: Secondary | ICD-10-CM

## 2012-12-14 LAB — COMPREHENSIVE METABOLIC PANEL
ALT: 21 U/L (ref 0–35)
AST: 24 U/L (ref 0–37)
Albumin: 3.2 g/dL — ABNORMAL LOW (ref 3.5–5.2)
Alkaline Phosphatase: 71 U/L (ref 39–117)
Chloride: 104 mEq/L (ref 96–112)
Potassium: 3.7 mEq/L (ref 3.5–5.1)
Sodium: 140 mEq/L (ref 135–145)
Total Bilirubin: 0.3 mg/dL (ref 0.3–1.2)

## 2012-12-14 LAB — HEPATITIS B SURFACE ANTIGEN: Hepatitis B Surface Ag: NEGATIVE

## 2012-12-14 LAB — CBC
Platelets: 321 10*3/uL (ref 150–400)
RDW: 14.6 % (ref 11.5–15.5)
WBC: 13.6 10*3/uL — ABNORMAL HIGH (ref 4.0–10.5)

## 2012-12-14 MED ORDER — ESMOLOL HCL-SODIUM CHLORIDE 2000 MG/100ML IV SOLN
25.0000 ug/kg/min | INTRAVENOUS | Status: DC
  Administered 2012-12-14: 25 ug/kg/min via INTRAVENOUS
  Filled 2012-12-14: qty 100

## 2012-12-14 MED ORDER — ADENOSINE 6 MG/2ML IV SOLN
INTRAVENOUS | Status: AC
  Filled 2012-12-14: qty 2

## 2012-12-14 MED ORDER — RENA-VITE PO TABS
1.0000 | ORAL_TABLET | Freq: Every day | ORAL | Status: DC
  Administered 2012-12-14 – 2012-12-17 (×4): 1 via ORAL
  Filled 2012-12-14 (×4): qty 1

## 2012-12-14 NOTE — Progress Notes (Signed)
3:34 PM  12/14/2012   Dr. Aundra Dubin update on pt status and vitals.  Tabitha Green, [name]

## 2012-12-14 NOTE — Care Management Note (Addendum)
    Page 1 of 1   12/17/2012     3:10:24 PM   CARE MANAGEMENT NOTE 12/17/2012  Patient:  Tabitha Green, Tabitha Green   Account Number:  [id number]  Date Initiated:  12/14/2012  Documentation initiated by:  Elissa Hefty  Subjective/Objective Assessment:   adm w svt     Action/Plan:   lives w husband, pcp dr Gwyndolyn Saxon hopper   Anticipated DC Date:  12/17/2012   Anticipated DC Plan:  [place]  CM consult      Choice offered to / List presented to:             Status of service:  Completed, signed off Medicare Important Message given?   (If response is "NO", the following Medicare IM given date fields will be blank) Date Medicare IM given:   Date Additional Medicare IM given:    Discharge Disposition:  HOME/SELF CARE  Per UR Regulation:  Reviewed for med. necessity/level of care/duration of stay  If discussed at [place] of Stay Meetings, dates discussed:    Comments:  12/17/12 Charlene Detter,RN,BSN [id number] [place].  NO NEEDS ANTICIPATED.

## 2012-12-14 NOTE — Progress Notes (Signed)
[facility] Progress Note [place] TEAM 1 - Stepdown/ICU TEAM   Tabitha Green [id number] DOB: [birth year] DOA: 12/13/2012 PCP: Unice Cobble, MD  Brief narrative: 67 year old female patient with known chronic kidney disease with plans in place to begin hemodialysis this coming Monday, 12/17/2012. Recently discharged from [facility] on 12/09/2012 after treatment for symptomatic sinus bradycardia. That time she was taken off calcium channel blockers and beta blockers. She presented to the hospital because of complaints related to awareness of tachypalpitations that were symptomatic with associated clamminess and lightheadedness. No chest pain. Any [facility] department she was found to have supraventricular tachycardia with rates between 120 130 and was given adenosine. She subsequently converted to sinus rhythm and was started on verapamil low-dose 120 mg daily after evaluation by cardiology.  Assessment/Plan: Principal Problem:   SVT (supraventricular tachycardia) -Initially converted after a dose of adenosine but redeveloped recalcitrant SVT overnight that was not responsive to adenosine and patient was subsequently transferred to the ICU and started on an esmolol drip which is now been referred and discontinued -Cardiology assisting -Continue verapamil with cardiology noting rhythm could be sinus tachycardia but cannot fully rule out atrial tachycardia by telemetry, check EKG -Followup on echocardiogram   Active Problems:   Chronic kidney disease (CKD), stage IV (severe)/Uremia -Renal following -Planned initiation hemodialysis today    Hypothyroidism -Found to have markedly elevated TSH 12/07/2012-66.01 -Documented compliance with Synthroid-unclear if dose was adjusted during that admission-was to followup with PCP -Currently on 175 mcg of Synthroid    Unspecified essential hypertension -BP poorly controlled and likely related to volume status from chronic kidney  disease -Initiation of hemodialysis should help -Add Norvasc    VITAMIN D DEFICIENCY    Hyperglycemia  -Serum glucose has been elevated but not significantly -Defer repeat hemoglobin A1c to primary care provider after discharge   DVT prophylaxis: Lovenox  Code Status: Full Family Communication: Patient and husband Disposition Plan: Remain in step down until SVT is proven to be stable Isolation: None Allergies: Cefaclor, naproxen, metoprolol-ACE inhibitor induces cough  Consultants: Nephrology Cardiology  Procedures: 2-D echocardiogram pending  Antibiotics: None  HPI/Subjective: Patient without complaints. Denies any current tachypalpitations or shortness of breath.   Objective: Blood pressure 175/102, pulse 121, temperature 98.7 F (37.1 C), temperature source Oral, resp. rate 20, weight 96 kg (211 lb 10.3 oz), SpO2 99.00%.  Intake/Output Summary (Last 24 hours) at 12/14/12 1203 Last data filed at 12/14/12 0900  Gross per 24 hour  Intake 849.05 ml  Output    325 ml  Net 524.05 ml     Exam: General: No acute respiratory distress Lungs: Clear to auscultation bilaterally without wheezes or crackles, 2 l Cardiovascular: Regular tachycardic rate and rhythm without murmur gallop or rub normal S1 and S2, no peripheral edema or JVD Abdomen: Nontender, nondistended, soft, bowel sounds positive, no rebound, no ascites, no appreciable mass Musculoskeletal: No significant cyanosis, clubbing of bilateral lower extremities Neurological: Alert and oriented x 3, moves all extremities x 4 without focal neurological deficits, CN 2-12 intact  Data Reviewed: Basic Metabolic Panel:  Recent Labs Lab 12/07/12 2048 12/07/12 2326 12/08/12 0235 12/09/12 0532 12/11/12 1234 12/13/12 1313 12/13/12 1344 12/14/12 0415  NA 136  --  140 139 137 138 140 140  K 5.3*  --  4.9 4.1 3.4* 4.1 4.0 3.7  CL 102  --  102 103 101 103 112 104  CO2 18*  --  21 22 19  14*  --  18*  GLUCOSE 102*   --  110* 101* 115* 187* 184* 106*  BUN 70*  --  72* 78* 68* 68* 67* 75*  CREATININE 10.28*  --  10.62* 10.33* 9.42* 9.19* 8.80* 9.60*  CALCIUM 9.7  --  9.7 8.1* 8.8 9.1  --  9.3  MG  --  2.4  --   --   --   --   --   --   PHOS  --   --  6.2*  --   --   --   --   --    Liver Function Tests:  Recent Labs Lab 12/11/12 1234 12/14/12 0415  AST 24 24  ALT 29 21  ALKPHOS 73 71  BILITOT 0.3 0.3  PROT 7.2 6.9  ALBUMIN 3.5 3.2*   No results found for this basename: LIPASE, AMYLASE,  in the last 168 hours No results found for this basename: AMMONIA,  in the last 168 hours CBC:  Recent Labs Lab 12/07/12 2048 12/08/12 0235 12/11/12 1234 12/13/12 1313 12/13/12 1344 12/14/12 0415  WBC 12.3* 10.1 11.3* 15.1*  --  13.6*  HGB 10.4* 10.8* 13.0 13.6 14.3 12.8  HCT 31.2* 32.3* 36.6 38.5 42.0 38.5  MCV 93.4 92.6 91.0 90.0  --  94.6  PLT 185 220 274 358  --  321   Cardiac Enzymes:  Recent Labs Lab 12/07/12 2048 12/08/12 0235 12/08/12 0824  TROPONINI <0.30 <0.30 <0.30   BNP (last 3 results)  Recent Labs  12/13/12 1313  PROBNP >70000.0*   CBG:  Recent Labs Lab 12/11/12 1244  GLUCAP 111*    Recent Results (from the past 240 hour(s))  MRSA PCR SCREENING     Status: None   Collection Time    12/07/12  7:58 PM      Result Value Range Status   MRSA by PCR NEGATIVE  NEGATIVE Final   Comment:            The GeneXpert MRSA Assay (FDA     approved for NASAL specimens     only), is one component of a     comprehensive MRSA colonization     surveillance program. It is not     intended to diagnose MRSA     infection nor to guide or     monitor treatment for     MRSA infections.  URINE CULTURE     Status: None   Collection Time    12/11/12  1:11 PM      Result Value Range Status   Specimen Description URINE, CLEAN CATCH   Final   Special Requests NONE   Final   Culture  Setup Time 12/11/2012 14:27   Final   Colony Count NO GROWTH   Final   Culture NO GROWTH   Final    Report Status 12/12/2012 FINAL   Final  MRSA PCR SCREENING     Status: None   Collection Time    12/13/12  5:39 PM      Result Value Range Status   MRSA by PCR NEGATIVE  NEGATIVE Final   Comment:            The GeneXpert MRSA Assay (FDA     approved for NASAL specimens     only), is one component of a     comprehensive MRSA colonization     surveillance program. It is not     intended to diagnose MRSA     infection nor to guide or     monitor treatment for  MRSA infections.     Studies:  Recent x-ray studies have been reviewed in detail by the Attending Physician  Scheduled Meds:  Reviewed in detail by the Attending Physician   Erin Hearing, [place] [facility] Office  [phone] Pager [phone]  On-Call/Text Page:      [email or web address]      password [email or web address]  If 7PM-7AM, please contact night-coverage [email or web address] Password [email or web address] 12/14/2012, 12:03 PM   LOS: 1 day   I have examined the patient, reviewed the chart and modified the above note which I agree with.   [id number] 12/14/2012, 4:41 PM

## 2012-12-14 NOTE — Progress Notes (Signed)
S: Feels a little better, still some nausea O:BP 174/104  Pulse 120  Temp(Src) 98.7 F (37.1 C) (Oral)  Resp 19  Wt 96 kg (211 lb 10.3 oz)  BMI 33.65 kg/m2  SpO2 98%  Intake/Output Summary (Last 24 hours) at 12/14/12 0837 Last data filed at 12/14/12 0700  Gross per 24 hour  Intake 429.05 ml  Output    325 ml  Net 104.05 ml   Weight change:  [id number] and alert [id number], regular Resp:clear Abd:+BS NTND Ext:no edema  Rt UA AVF + bruit NEURO:CNI ox3 no asterixis   . amLODipine  10 mg Oral Daily  . aspirin EC  81 mg Oral Daily  . calcitRIOL  0.5 mcg Oral Daily  . enoxaparin (LOVENOX) injection  30 mg Subcutaneous Q24H  . furosemide  80 mg Oral q morning - 10a  . hydrALAZINE  50 mg Oral Q8H  . levothyroxine  175 mcg Oral Custom  . [START ON 12/18/2012] levothyroxine  262.5 mcg Oral Custom  . verapamil  120 mg Oral Daily   Dg Chest Portable 1 View  12/13/2012  *RADIOLOGY REPORT*  Clinical Data: Tachycardia  PORTABLE CHEST - 1 VIEW  Comparison: 12/07/2012  Findings: Cardiac shadow remains enlarged.  The degree of vascular congestion has improved.  The lungs are clear.  No pneumothorax or effusion is seen.  IMPRESSION: Improved vascular congestion.  Stable cardiomegaly.   Original Report Authenticated By: [name] Catalina, M.D.    BMET    Component Value Date/Time   NA 140 12/14/2012 0415   K 3.7 12/14/2012 0415   CL 104 12/14/2012 0415   CO2 18* 12/14/2012 0415   GLUCOSE 106* 12/14/2012 0415   BUN 75* 12/14/2012 0415   CREATININE 9.60* 12/14/2012 0415   CALCIUM 9.3 12/14/2012 0415   CALCIUM 8.4 08/30/2012 0819   GFRNONAA 4* 12/14/2012 0415   GFRAA 4* 12/14/2012 0415   CBC    Component Value Date/Time   WBC 13.6* 12/14/2012 0415   RBC 4.07 12/14/2012 0415   HGB 12.8 12/14/2012 0415   HCT 38.5 12/14/2012 0415   PLT 321 12/14/2012 0415   MCV 94.6 12/14/2012 0415   MCH 31.4 12/14/2012 0415   MCHC 33.2 12/14/2012 0415   RDW 14.6 12/14/2012 0415   LYMPHSABS 1.6 09/10/2009 0812   MONOABS 0.4 09/10/2009 0812   EOSABS 0.6 09/10/2009 0812   BASOSABS 0.1 09/10/2009 0812     Assessment: 1. Uremia and new ESRD 2. SVT 3. HTN  Plan: 1.  HD today 2. Dc calcitriol 3. Renal diet 4. Plan HD again in AM.  She already has outpt HD set up at [facility] MWF 5. Start renal vitamin 6. DC lasix   Tabitha Green

## 2012-12-14 NOTE — Progress Notes (Signed)
Nursing Pt hr up to 160-170's.  [facility] notified, requested to call cardiology.  Cardiology notified and requested to speak with triad. [facility] called again.  Vivien Presto, NP to see pt.  Adenosine 6mg  iv ordered.  Rapid response notified and at bedside, Dr. Nichola Sizer at bedside as well.  Oxygen 2L on pt, EKG done, pacer pads on pt.  Adenosine given.  Pt hr down to 120's, 6mg  Adenosine repeated as ordered.  Hr remained 120's.  B/P remained stable.  15 min later HR 160's.  Adenosine 12 mg iv given.  No response.  Another 12 mg IV given.  Hr down to 100's.  Hr remained down for approx 30 min then rebound to 150's.  Dr. Colon Flattery aware, L. [place] aware.  Orders to transfer pt to unit received at [place].  House coverage notified.

## 2012-12-14 NOTE — Progress Notes (Signed)
    67 yo female with hx significant for hypertension, chronic kidney disease on transplant list, and uncontrolled hyperthyroidism recent admission for symptomatic bradycardia in 20-30's after starting labetalol 200 mg bid (s/p atropine, glucagon, calcium chloride and transcutaneous pacing).  Dx with UTI 5/6 as outpt and treated with Cipro. Re-presented 5/8 with c/o malaise and found to have Supraventricular tachycardia HR 178 bpm.  Subjective:  SVT initially converted to SR/ST with adenosine 6 mg in ED but overnight rapid response called for sustained SVT HR 170s. Additional adenosine ineffective thus started on esmolol gtt with good response and HR in 110s.  Esmolol currently off this morning and HR 120s on exam.  Objective:  Vital Signs in the last 24 hours: Temp:  [97.4 F (36.3 C)-98.7 F (37.1 C)] 98.3 F (36.8 C) (05/09 0400) Pulse Rate:  [80-180] 120 (05/09 0700) Resp:  [14-25] 19 (05/09 0700) BP: (111-198)/(81-120) 174/104 mmHg (05/09 0700) SpO2:  [97 %-100 %] 98 % (05/09 0700) Weight:  [211 lb 10.3 oz (96 kg)] 211 lb 10.3 oz (96 kg) (05/09 0100)  Intake/Output from previous day: 05/08 0701 - 05/09 0700 In: 429.1 [P.O.:360; I.V.:69.1] Out: 325 [Urine:325]  Physical Exam: General: Well-developed, well-nourished, White female, in no acute distress, sitting upright in bed eating breakfast Head: Normocephalic, atraumatic. Eyes: PERRLA, EOMI, No signs of anemia or jaundice. Neck: supple, no thyroidmegaly appreciated, ++carotid bruits bilaterally, ++JVD appreciated. Lungs: Normal respiratory effort. Clear to auscultation bilaterally from apices to bases without crackles or wheezes appreciated. Heart: tachycardic, normal S1 and S2, faint murmur Abdomen: BS normoactive. Soft, obese, non-tender.  Extremities: Warm, No pretibial edema, distal pulses intact Neurologic: grossly non-focal, alert and oriented x3, appropriate and cooperative throughout examination.   Lab  Results:  Recent Labs  12/13/12 1313 12/13/12 1344 12/14/12 0415  WBC 15.1*  --  13.6*  HGB 13.6 14.3 12.8  PLT 358  --  321    Recent Labs  12/13/12 1313 12/13/12 1344 12/14/12 0415  NA 138 140 140  K 4.1 4.0 3.7  CL 103 112 104  CO2 14*  --  18*  GLUCOSE 187* 184* 106*  BUN 68* 67* 75*  CREATININE 9.19* 8.80* 9.60*   No results found for this basename: TROPONINI, CK, MB,  in the last 72 hours  Cardiac Studies: 2D ECHO pending  Tele: Tachy 120s, mult PVCs  Assessment/Plan:  1. Supraventricular tachycardia: in setting of worsening uremia, poorly controlled hypothyroidism, elevated pBNP >7K,  and infection - initiate HD per Renal Service - cont verapamil, next dose due 10am today --> cont to monitor for need for catheter ablation - cont Synthroid  2. Hypertension: bp this morning 180/110: on amlodipine 10 mg qd and verapamil 120 mg qd -cont hydralazine prn  3. Acute Kidney Injury on CKD: in setting of acute illness, Nephrology managing  Recent Labs Lab 12/09/12 0532 12/11/12 1234 12/13/12 1313 12/13/12 1344 12/14/12 0415  CREATININE 10.33* 9.42* 9.19* 8.80* 9.60*   4. UTI: resolved s/p Cipro  [name], M.D. 12/14/2012, 7:48 AM  Patient seen with resident, agree with the above note.  She is off esmolol and has received a dose of verapamil.  HR in the 120s, may be NSR but cannot fully rule out atrial tachycardia by telemetry monitoring so will get 12 lead ECG this morning.  Also getting echo.    [name] 12/14/2012 10:12 AM

## 2012-12-14 NOTE — Progress Notes (Signed)
Echocardiogram 2D Echocardiogram has been performed.  Tabitha Green 12/14/2012, 10:51 AM

## 2012-12-14 NOTE — Significant Event (Signed)
Rapid Response Event Note  Overview: Time Called: 2150 Arrival Time: 2155 Event Type: Cardiac  Initial Focused Assessment: Called by bedside RN for patient's HR 170s sustained in SVT. Patient had received 6mg  adenosine in ED earlier in the evening and had converted to NSR. Patient diaphoretic, HR 178, SBP 120s, RR 22, 98% on [place]. [facility] NP and MD at bedside. Orders received for additional 6mg  adenosine dose. EKG obtained, pads placed on patient, crash cart in room. Adenosine given, patient's HR 120s NSR to SVT, additional 6 mg dose of adenosine given. Approximately 15 minutes later, patient's HR 160-170s, SBP 130s, Cardiology at bedside with [facility] NP, additional 12mg  dose of adenosine given with no response. Any additional dose of 12 mg of adenosine was given. HR 110s. Approximately 30 minutes later patient's HR 170s SVT, orders received for esmolol drip and transfer to CICU. Patient transported to unit with bedside RN.    Event Summary: Name of Physician Notified: Theora Master NP at 2145  Name of Consulting Physician Notified: Dr. Colon Flattery at 2200  Outcome: Transferred (Comment)  Event End Time: [place], [place]

## 2012-12-15 LAB — RENAL FUNCTION PANEL
Albumin: 3 g/dL — ABNORMAL LOW (ref 3.5–5.2)
BUN: 71 mg/dL — ABNORMAL HIGH (ref 6–23)
Chloride: 102 mEq/L (ref 96–112)
Creatinine, Ser: 9.4 mg/dL — ABNORMAL HIGH (ref 0.50–1.10)

## 2012-12-15 LAB — HEPATITIS B CORE ANTIBODY, TOTAL: Hep B Core Total Ab: NEGATIVE

## 2012-12-15 MED ORDER — PROPRANOLOL HCL 20 MG PO TABS
20.0000 mg | ORAL_TABLET | Freq: Two times a day (BID) | ORAL | Status: DC
  Administered 2012-12-15 (×2): 20 mg via ORAL
  Filled 2012-12-15 (×5): qty 1

## 2012-12-15 MED ORDER — ESMOLOL HCL-SODIUM CHLORIDE 2000 MG/100ML IV SOLN
25.0000 ug/kg/min | INTRAVENOUS | Status: DC
  Administered 2012-12-15: 25 ug/kg/min via INTRAVENOUS
  Filled 2012-12-15: qty 100

## 2012-12-15 MED ORDER — SODIUM CHLORIDE 0.9 % IV BOLUS (SEPSIS)
250.0000 mL | Freq: Once | INTRAVENOUS | Status: AC
  Administered 2012-12-15: 250 mL via INTRAVENOUS

## 2012-12-15 MED ORDER — CALCIUM CARBONATE 1250 (500 CA) MG PO TABS
2.0000 | ORAL_TABLET | Freq: Three times a day (TID) | ORAL | Status: DC
  Administered 2012-12-15 – 2012-12-17 (×5): 1000 mg via ORAL
  Filled 2012-12-15 (×11): qty 2

## 2012-12-15 NOTE — Progress Notes (Signed)
Heart rate in the 150's.  .  Pt states she can feel her heart palpating.  Calling and notified dr. Percival Spanish and orders received.

## 2012-12-15 NOTE — Progress Notes (Signed)
[facility] Progress Note  TEAM 1 - Stepdown/ICU TEAM   KEVIN BROWNELL [id number] DOB: Oct 18, 1945 DOA: 12/13/2012 PCP: Unice Cobble, MD  Brief narrative: 67 year old female patient with known chronic kidney disease with plans in place to begin hemodialysis this coming Monday, 12/17/2012. Recently discharged from [facility] on 12/09/2012 after treatment for symptomatic sinus bradycardia. That time she was taken off calcium channel blockers and beta blockers. She presented to the hospital because of complaints related to awareness of tachypalpitations that were symptomatic with associated clamminess and lightheadedness. No chest pain. Any [facility] department she was found to have supraventricular tachycardia with rates between 120 130 and was given adenosine. She subsequently converted to sinus rhythm and was started on verapamil low-dose 120 mg daily after evaluation by cardiology.  Assessment/Plan: Principal Problem:   SVT (supraventricular tachycardia) -Initially converted after a dose of adenosine but redeveloped recalcitrant SVT overnight that was not responsive to adenosine and patient was subsequently transferred to the ICU and started on an esmolol drip which is now been referred and discontinued -Cardiology assisting -Continue verapamil  -Propranol added today -Followup on echocardiogram   Active Problems:   Chronic kidney disease (CKD), stage IV (severe)/Uremia -Renal following -daily dialysis for now    Hypothyroidism -Found to have markedly elevated TSH 12/07/2012-66.01 -Documented compliance with Synthroid-unclear if dose was adjusted during that admission-was to followup with PCP -Currently on 175 mcg of Synthroid    Unspecified essential hypertension -BP poorly controlled and likely related to volume status from chronic kidney disease -Initiation of hemodialysis should help -cont Norvasc -Propranolol added today    VITAMIN D DEFICIENCY     Hyperglycemia  -Serum glucose has been elevated but not significantly -Defer repeat hemoglobin A1c to primary care provider after discharge   DVT prophylaxis: Lovenox  Code Status: Full Family Communication: Patient and husband Disposition Plan: transfer to tele Isolation: None Allergies: Cefaclor, naproxen, metoprolol-ACE inhibitor induces cough  Consultants: Nephrology Cardiology  Procedures: 2-D echocardiogram pending  Antibiotics: None  HPI/Subjective: Patient without complaints today.   Objective: Blood pressure 132/97, pulse 88, temperature 98 F (36.7 C), temperature source Oral, resp. rate 18, weight 88.9 kg (195 lb 15.8 oz), SpO2 100.00%.  Intake/Output Summary (Last 24 hours) at 12/15/12 1851 Last data filed at 12/15/12 1818  Gross per 24 hour  Intake 1146.32 ml  Output   2375 ml  Net -1228.68 ml     Exam: General: No acute respiratory distress Lungs: Clear to auscultation bilaterally without wheezes or crackles, 2 l Cardiovascular: Regular tachycardic rate and rhythm without murmur gallop or rub normal S1 and S2, no peripheral edema or JVD Abdomen: Nontender, nondistended, soft, bowel sounds positive, no rebound, no ascites, no appreciable mass Musculoskeletal: No significant cyanosis, clubbing of bilateral lower extremities Neurological: Alert and oriented x 3, moves all extremities x 4 without focal neurological deficits, CN 2-12 intact  Data Reviewed: Basic Metabolic Panel:  Recent Labs Lab 12/09/12 0532 12/11/12 1234 12/13/12 1313 12/13/12 1344 12/14/12 0415 12/15/12 0404  NA 139 137 138 140 140 137  K 4.1 3.4* 4.1 4.0 3.7 4.2  CL 103 101 103 112 104 102  CO2 22 19 14*  --  18* 19  GLUCOSE 101* 115* 187* 184* 106* 92  BUN 78* 68* 68* 67* 75* 71*  CREATININE 10.33* 9.42* 9.19* 8.80* 9.60* 9.40*  CALCIUM 8.1* 8.8 9.1  --  9.3 8.4  PHOS  --   --   --   --   --  7.3*   Liver Function Tests:  Recent Labs Lab 12/11/12 1234  12/14/12 0415 12/15/12 0404  AST 24 24  --   ALT 29 21  --   ALKPHOS 73 71  --   BILITOT 0.3 0.3  --   PROT 7.2 6.9  --   ALBUMIN 3.5 3.2* 3.0*   No results found for this basename: LIPASE, AMYLASE,  in the last 168 hours No results found for this basename: AMMONIA,  in the last 168 hours CBC:  Recent Labs Lab 12/11/12 1234 12/13/12 1313 12/13/12 1344 12/14/12 0415  WBC 11.3* 15.1*  --  13.6*  HGB 13.0 13.6 14.3 12.8  HCT 36.6 38.5 42.0 38.5  MCV 91.0 90.0  --  94.6  PLT 274 358  --  321   Cardiac Enzymes: No results found for this basename: CKTOTAL, CKMB, CKMBINDEX, TROPONINI,  in the last 168 hours BNP (last 3 results)  Recent Labs  12/13/12 1313  PROBNP >70000.0*   CBG:  Recent Labs Lab 12/11/12 1244  GLUCAP 111*    Recent Results (from the past 240 hour(s))  MRSA PCR SCREENING     Status: None   Collection Time    12/07/12  7:58 PM      Result Value Range Status   MRSA by PCR NEGATIVE  NEGATIVE Final   Comment:            The GeneXpert MRSA Assay (FDA     approved for NASAL specimens     only), is one component of a     comprehensive MRSA colonization     surveillance program. It is not     intended to diagnose MRSA     infection nor to guide or     monitor treatment for     MRSA infections.  URINE CULTURE     Status: None   Collection Time    12/11/12  1:11 PM      Result Value Range Status   Specimen Description URINE, CLEAN CATCH   Final   Special Requests NONE   Final   Culture  Setup Time 12/11/2012 14:27   Final   Colony Count NO GROWTH   Final   Culture NO GROWTH   Final   Report Status 12/12/2012 FINAL   Final  MRSA PCR SCREENING     Status: None   Collection Time    12/13/12  5:39 PM      Result Value Range Status   MRSA by PCR NEGATIVE  NEGATIVE Final   Comment:            The GeneXpert MRSA Assay (FDA     approved for NASAL specimens     only), is one component of a     comprehensive MRSA colonization     surveillance  program. It is not     intended to diagnose MRSA     infection nor to guide or     monitor treatment for     MRSA infections.     Studies:  Recent x-ray studies have been reviewed in detail by the Attending Physician  Scheduled Meds:  Reviewed in detail by the Attending Physician   Debbe Odea, MD Pager [phone]  If 7PM-7AM, please contact night-coverage [email or web address] Password [email or web address] 12/15/2012, 6:51 PM   LOS: 2 days

## 2012-12-15 NOTE — Progress Notes (Addendum)
Patient ID: Tabitha Green, female   DOB: 20-Jun-1946, 67 y.o.   MRN: [id number] Subjective:  " I feel ok" Objective:  Vital Signs in the last 24 hours: Temp:  [97.8 F (36.6 C)-98.6 F (37 C)] 98.5 F (36.9 C) (05/10 0745) Pulse Rate:  [84-150] 98 (05/10 0700) Resp:  [14-29] 17 (05/10 0700) BP: (128-188)/(85-128) 171/105 mmHg (05/10 0700) SpO2:  [95 %-100 %] 98 % (05/10 0700) Weight:  [211 lb 10.3 oz (96 kg)] 211 lb 10.3 oz (96 kg) (05/09 1200)  Intake/Output from previous day: 05/09 0701 - 05/10 0700 In: 1169 [P.O.:1100; I.V.:69] Out: 1300 [Urine:300] Intake/Output from this shift: Total I/O In: 7.2 [I.V.:7.2] Out: -   Physical Exam: Well appearing NAD HEENT: Unremarkable Neck:  7 cm JVD, no thyromegally Back:  No CVA tenderness Lungs:  Clear with no wheezes or rhonchi. Scattered rales HEART:  Regular rate rhythm, no murmurs, no rubs, no clicks Abd:  soft, positive bowel sounds, no organomegally, no rebound, no guarding Ext:  2 plus pulses, no edema, no cyanosis, no clubbing Skin:  No rashes no nodules Neuro:  CN II through XII intact, motor grossly intact  Lab Results:  Recent Labs  12/13/12 1313 12/13/12 1344 12/14/12 0415  WBC 15.1*  --  13.6*  HGB 13.6 14.3 12.8  PLT 358  --  321    Recent Labs  12/14/12 0415 12/15/12 0404  NA 140 137  K 3.7 4.2  CL 104 102  CO2 18* 19  GLUCOSE 106* 92  BUN 75* 71*  CREATININE 9.60* 9.40*   No results found for this basename: TROPONINI, CK, MB,  in the last 72 hours Hepatic Function Panel  Recent Labs  12/14/12 0415 12/15/12 0404  PROT 6.9  --   ALBUMIN 3.2* 3.0*  AST 24  --   ALT 21  --   ALKPHOS 71  --   BILITOT 0.3  --    No results found for this basename: CHOL,  in the last 72 hours No results found for this basename: PROTIME,  in the last 72 hours  Imaging: Dg Chest Portable 1 View  12/13/2012  *RADIOLOGY REPORT*  Clinical Data: Tachycardia  PORTABLE CHEST - 1 VIEW  Comparison: 12/07/2012   Findings: Cardiac shadow remains enlarged.  The degree of vascular congestion has improved.  The lungs are clear.  No pneumothorax or effusion is seen.  IMPRESSION: Improved vascular congestion.  Stable cardiomegaly.   Original Report Authenticated By: [name] Catalina, M.D.     Cardiac Studies: Tele - sinus tachycardia Assessment/Plan:  1. SVT - she is improved on verapamil and esmolol. I would start low dose metoprolol which is eliminated by the liver. Ok to transfer to tele bed from my perspective.  LOS: 2 days    Erin Obando,M.D. 12/15/2012, 8:52 AM   Note rash on metoprolol. Would try propranolol. Mikle Bosworth.D.

## 2012-12-15 NOTE — Progress Notes (Signed)
S: No N/V O:BP 171/105  Pulse 98  Temp(Src) 98.6 F (37 C) (Oral)  Resp 17  Wt 96 kg (211 lb 10.3 oz)  BMI 33.65 kg/m2  SpO2 98%  Intake/Output Summary (Last 24 hours) at 12/15/12 0705 Last data filed at 12/15/12 0500  Gross per 24 hour  Intake 1154.56 ml  Output   1300 ml  Net -145.44 ml   Weight change: 0 kg (0 lb) [id number] and alert CVS:RRR Resp:clear Abd:+BS NTND Ext:no edema  Rt UA AVF + bruit NEURO:CNI ox3 no asterixis   . amLODipine  10 mg Oral Daily  . aspirin EC  81 mg Oral Daily  . enoxaparin (LOVENOX) injection  30 mg Subcutaneous Q24H  . hydrALAZINE  50 mg Oral Q8H  . levothyroxine  175 mcg Oral Custom  . [START ON 12/18/2012] levothyroxine  262.5 mcg Oral Custom  . multivitamin  1 tablet Oral Daily  . verapamil  120 mg Oral Daily   Dg Chest Portable 1 View  12/13/2012  *RADIOLOGY REPORT*  Clinical Data: Tachycardia  PORTABLE CHEST - 1 VIEW  Comparison: 12/07/2012  Findings: Cardiac shadow remains enlarged.  The degree of vascular congestion has improved.  The lungs are clear.  No pneumothorax or effusion is seen.  IMPRESSION: Improved vascular congestion.  Stable cardiomegaly.   Original Report Authenticated By: [name] Catalina, M.D.    BMET    Component Value Date/Time   NA 137 12/15/2012 0404   K 4.2 12/15/2012 0404   CL 102 12/15/2012 0404   CO2 19 12/15/2012 0404   GLUCOSE 92 12/15/2012 0404   BUN 71* 12/15/2012 0404   CREATININE 9.40* 12/15/2012 0404   CALCIUM 8.4 12/15/2012 0404   CALCIUM 8.4 08/30/2012 0819   GFRNONAA 4* 12/15/2012 0404   GFRAA 4* 12/15/2012 0404   CBC    Component Value Date/Time   WBC 13.6* 12/14/2012 0415   RBC 4.07 12/14/2012 0415   HGB 12.8 12/14/2012 0415   HCT 38.5 12/14/2012 0415   PLT 321 12/14/2012 0415   MCV 94.6 12/14/2012 0415   MCH 31.4 12/14/2012 0415   MCHC 33.2 12/14/2012 0415   RDW 14.6 12/14/2012 0415   LYMPHSABS 1.6 09/10/2009 0812   MONOABS 0.4 09/10/2009 0812   EOSABS 0.6 09/10/2009 0812   BASOSABS 0.1 09/10/2009 0812      Assessment: 1. Uremia and new ESRD 2. SVT 3. HTN  Plan: 1.  HD today 2. Start PO4 binder   Devonne Kitchen T

## 2012-12-16 DIAGNOSIS — N184 Chronic kidney disease, stage 4 (severe): Secondary | ICD-10-CM

## 2012-12-16 LAB — RENAL FUNCTION PANEL
BUN: 60 mg/dL — ABNORMAL HIGH (ref 6–23)
CO2: 22 mEq/L (ref 19–32)
Calcium: 8.4 mg/dL (ref 8.4–10.5)
Chloride: 99 mEq/L (ref 96–112)
Creatinine, Ser: 7.88 mg/dL — ABNORMAL HIGH (ref 0.50–1.10)
Glucose, Bld: 109 mg/dL — ABNORMAL HIGH (ref 70–99)

## 2012-12-16 MED ORDER — PROPRANOLOL HCL 40 MG PO TABS
40.0000 mg | ORAL_TABLET | Freq: Two times a day (BID) | ORAL | Status: DC
  Administered 2012-12-16 (×2): 40 mg via ORAL
  Filled 2012-12-16 (×4): qty 1

## 2012-12-16 NOTE — Progress Notes (Signed)
Report to [facility] 2000; preparing pt for transfer to floor with tele, wheelchair, and RN

## 2012-12-16 NOTE — Progress Notes (Signed)
S: No uremic Sxs.  Episode of SVT last night O:BP 133/77  Pulse 83  Temp(Src) 98.2 F (36.8 C) (Oral)  Resp 10  Ht 5' 6.5" (1.689 m)  Wt 88.9 kg (195 lb 15.8 oz)  BMI 31.16 kg/m2  SpO2 99%  Intake/Output Summary (Last 24 hours) at 12/16/12 0900 Last data filed at 12/16/12 0630  Gross per 24 hour  Intake 1620.16 ml  Output   2775 ml  Net -1154.84 ml   Weight change: -4.5 kg (-9 lb 14.7 oz) [id number] and alert CVS:RRR Resp:clear Abd:+BS NTND Ext:no edema  Rt UA AVF + bruit NEURO:CNI ox3 no asterixis   . amLODipine  10 mg Oral Daily  . aspirin EC  81 mg Oral Daily  . calcium carbonate  2 tablet Oral TID WC  . enoxaparin (LOVENOX) injection  30 mg Subcutaneous Q24H  . hydrALAZINE  50 mg Oral Q8H  . levothyroxine  175 mcg Oral Custom  . [START ON 12/18/2012] levothyroxine  262.5 mcg Oral Custom  . multivitamin  1 tablet Oral Daily  . propranolol  20 mg Oral BID  . verapamil  120 mg Oral Daily   No results found. BMET    Component Value Date/Time   NA 137 12/15/2012 0404   K 4.2 12/15/2012 0404   CL 102 12/15/2012 0404   CO2 19 12/15/2012 0404   GLUCOSE 92 12/15/2012 0404   BUN 71* 12/15/2012 0404   CREATININE 9.40* 12/15/2012 0404   CALCIUM 8.4 12/15/2012 0404   CALCIUM 8.4 08/30/2012 0819   GFRNONAA 4* 12/15/2012 0404   GFRAA 4* 12/15/2012 0404   CBC    Component Value Date/Time   WBC 13.6* 12/14/2012 0415   RBC 4.07 12/14/2012 0415   HGB 12.8 12/14/2012 0415   HCT 38.5 12/14/2012 0415   PLT 321 12/14/2012 0415   MCV 94.6 12/14/2012 0415   MCH 31.4 12/14/2012 0415   MCHC 33.2 12/14/2012 0415   RDW 14.6 12/14/2012 0415   LYMPHSABS 1.6 09/10/2009 0812   MONOABS 0.4 09/10/2009 0812   EOSABS 0.6 09/10/2009 0812   BASOSABS 0.1 09/10/2009 0812     Assessment: 1. Uremia and new ESRD 2. SVT 3. HTN  Plan: 1.  HD tomorrow to keep on MWF.  She is set up at [facility] MWF   [place]

## 2012-12-16 NOTE — Progress Notes (Signed)
    Subjective:  Overnight, the patient had an episode of SVT to the 140's, with symptomatic palpitations, lasting about 15 minutes, resolving spontaneously.  This morning, the patient notes no palpitations, no SOB.  The patient tolerated her second round of HD yesterday, though noted some weakness afterwards.  Objective:  Vital Signs in the last 24 hours: Temp:  [97.8 F (36.6 C)-98.6 F (37 C)] 98.2 F (36.8 C) (05/11 0400) Pulse Rate:  [56-110] 83 (05/11 0700) Resp:  [9-27] 10 (05/11 0700) BP: (94-181)/(74-115) 133/77 mmHg (05/11 0700) SpO2:  [88 %-100 %] 99 % (05/11 0700) Weight:  [195 lb 15.8 oz (88.9 kg)-201 lb 11.5 oz (91.5 kg)] 195 lb 15.8 oz (88.9 kg) (05/10 1818)  Intake/Output from previous day: 05/10 0701 - 05/11 0700 In: 1627.4 [P.O.:1320; I.V.:57.4; IV Piggyback:250] Out: 2775 [Urine:825]  Physical Exam: General: alert, cooperative, and in no apparent distress HEENT: pupils equal round and reactive to light, vision grossly intact, oropharynx clear and non-erythematous  Neck: supple, no lymphadenopathy, no JVD Lungs: clear to ascultation bilaterally, normal work of respiration, no wheezes, rales, ronchi Heart: regular rate and rhythm, no murmurs, gallops, or rubs Abdomen: soft, non-tender, non-distended, normal bowel sounds Extremities: no cyanosis, clubbing, or edema Neurologic: alert & oriented X3, cranial nerves II-XII intact, strength grossly intact, sensation intact to light touch  Lab Results:  Recent Labs  12/13/12 1313 12/13/12 1344 12/14/12 0415  WBC 15.1*  --  13.6*  HGB 13.6 14.3 12.8  PLT 358  --  321    Recent Labs  12/14/12 0415 12/15/12 0404  NA 140 137  K 3.7 4.2  CL 104 102  CO2 18* 19  GLUCOSE 106* 92  BUN 75* 71*  CREATININE 9.60* 9.40*   No results found for this basename: TROPONINI, CK, MB,  in the last 72 hours  Cardiac Studies: Echocardiogram 12/14/12 - Left ventricle: Tachycardia during the study. Technically limited  study. Hypokinesis of inferior and inferolateral walls. And hypo inferior septum. The EF MAY BE in the 40-45% range. The cavity size was at the upper limits of normal. Wall thickness was increased in a pattern of moderate LVH. - Right ventricle: Not able to assess RV function due to poor visualization. Not able to assess RV size due to poor visualization. But some data suggests RV may be OK.  Tele: Recurrence of SVT overnight to HR 140's  Assessment/Plan:  The patient is a 67 yo woman, history of hypothyroidism, CKD, presenting with SVT.  # SVT - The patient presented with SVT, which initially resolved with adenosine, but has recurred throughout her hospitalization.  She experienced another episode last night, which resolved spontaneously.  Of note, the patient was recently discharged after an admission for symptomatic bradycardia. -continue verapamil 120 -increase propranolol to 40 BID -continue telemetry monitoring  # HTN - BP's mildly elevated overnight to the 150-160's/90's, with HR's largely in the 80-90's -increase propranolol to 40 mg BID -continue verapamil -discontinue amlodipine, as patient already on verapamil -continue hydralazine  # AKI on CKD - patient started HD during this admission, and has now had 2 treatments to date. -management per nephrology  # Dispo - transfer to telemetry today   Elnora Morrison, M.D. 12/16/2012, 7:42 AM  Cardiology Attending  Stable overnight with one episode of SVT. Will uptitrate propranolol. If stable after HD tomorrow, could consider discharge home. I will see her back in the office for followup of her SVT.  Mikle Bosworth.D.

## 2012-12-16 NOTE — Progress Notes (Addendum)
[facility] Progress Note [place] TEAM 1 - Stepdown/ICU TEAM   Tabitha Green [id number] DOB: 01/01/1946 DOA: 12/13/2012 PCP: Unice Cobble, MD  Brief narrative: 66 year old female patient with known chronic kidney disease with plans in place to begin hemodialysis this coming Monday, 12/17/2012. Recently discharged from [facility] on 12/09/2012 after treatment for symptomatic sinus bradycardia. That time she was taken off calcium channel blockers and beta blockers. She presented to the hospital because of complaints related to awareness of tachypalpitations that were symptomatic with associated clamminess and lightheadedness. No chest pain. Any [facility] department she was found to have supraventricular tachycardia with rates between 120 130 and was given adenosine. She subsequently converted to sinus rhythm and was started on verapamil low-dose 120 mg daily after evaluation by cardiology.  Assessment/Plan: Principal Problem:   SVT (supraventricular tachycardia) -Initially converted after a dose of adenosine but redeveloped recalcitrant SVT overnight that was not responsive to adenosine and patient was subsequently transferred to the ICU and started on an esmolol drip which was discontinued on 5/10 -recurrent SVT on 5/10 which spontaneously corrected -Cardiology assisting -Continue verapamil  -Propranol being titrated up   Active Problems:  Chronic systolic CHF- noted on ECHO on 5/9 -EF 40-45% on ECHO with mod LVH - not on ACE- on Hydralazine but not nitrate - will allow cardiology to manage    Chronic kidney disease (CKD), stage IV (severe)/Uremia -Renal following -dialysis tomorrow    Hypothyroidism -Found to have markedly elevated TSH 12/07/2012-66.01 -Documented compliance with Synthroid-unclear if dose was adjusted during that admission-was to followup with PCP -Currently on 175 mcg of Synthroid    Unspecified essential hypertension -BP poorly controlled and likely  related to volume status from chronic kidney disease -Initiation of hemodialysis has helped -cont Norvasc and Propranolol     VITAMIN D DEFICIENCY    Hyperglycemia  -Serum glucose has been elevated but not significantly -Defer repeat hemoglobin A1c to primary care provider after discharge   DVT prophylaxis: Lovenox  Code Status: Full Family Communication: Patient and husband Disposition Plan: transfer to tele Isolation: None Allergies: Cefaclor, naproxen, metoprolol-ACE inhibitor induces cough  Consultants: Nephrology Cardiology  Procedures: 2-D echocardiogram pending  Antibiotics: None  HPI/Subjective: Patient has no complaints   Objective: Blood pressure 150/94, pulse 78, temperature 97.4 F (36.3 C), temperature source Oral, resp. rate 20, height 5' 6.5" (1.689 m), weight 88.9 kg (195 lb 15.8 oz), SpO2 99.00%.  Intake/Output Summary (Last 24 hours) at 12/16/12 1632 Last data filed at 12/16/12 1200  Gross per 24 hour  Intake   1630 ml  Output   2350 ml  Net   -720 ml     Exam: General: No acute respiratory distress Lungs: Clear to auscultation bilaterally without wheezes or crackles, 2 l Cardiovascular: Regular tachycardic rate and rhythm without murmur gallop or rub normal S1 and S2, no peripheral edema or JVD Abdomen: Nontender, nondistended, soft, bowel sounds positive, no rebound, no ascites, no appreciable mass Musculoskeletal: No significant cyanosis, clubbing of bilateral lower extremities Neurological: Alert and oriented x 3, moves all extremities x 4 without focal neurological deficits, CN 2-12 intact  Data Reviewed: Basic Metabolic Panel:  Recent Labs Lab 12/11/12 1234 12/13/12 1313 12/13/12 1344 12/14/12 0415 12/15/12 0404 12/16/12 0905  NA 137 138 140 140 137 136  K 3.4* 4.1 4.0 3.7 4.2 3.6  CL 101 103 112 104 102 99  CO2 19 14*  --  18* 19 22  GLUCOSE 115* 187* 184* 106* 92 109*  BUN 68* 68* 67* 75* 71* 60*  CREATININE 9.42* 9.19*  8.80* 9.60* 9.40* 7.88*  CALCIUM 8.8 9.1  --  9.3 8.4 8.4  PHOS  --   --   --   --  7.3* 4.4   Liver Function Tests:  Recent Labs Lab 12/11/12 1234 12/14/12 0415 12/15/12 0404 12/16/12 0905  AST 24 24  --   --   ALT 29 21  --   --   ALKPHOS 73 71  --   --   BILITOT 0.3 0.3  --   --   PROT 7.2 6.9  --   --   ALBUMIN 3.5 3.2* 3.0* 2.9*   No results found for this basename: LIPASE, AMYLASE,  in the last 168 hours No results found for this basename: AMMONIA,  in the last 168 hours CBC:  Recent Labs Lab 12/11/12 1234 12/13/12 1313 12/13/12 1344 12/14/12 0415  WBC 11.3* 15.1*  --  13.6*  HGB 13.0 13.6 14.3 12.8  HCT 36.6 38.5 42.0 38.5  MCV 91.0 90.0  --  94.6  PLT 274 358  --  321   Cardiac Enzymes: No results found for this basename: CKTOTAL, CKMB, CKMBINDEX, TROPONINI,  in the last 168 hours BNP (last 3 results)  Recent Labs  12/13/12 1313  PROBNP >70000.0*   CBG:  Recent Labs Lab 12/11/12 1244  GLUCAP 111*    Recent Results (from the past 240 hour(s))  MRSA PCR SCREENING     Status: None   Collection Time    12/07/12  7:58 PM      Result Value Range Status   MRSA by PCR NEGATIVE  NEGATIVE Final   Comment:            The GeneXpert MRSA Assay (FDA     approved for NASAL specimens     only), is one component of a     comprehensive MRSA colonization     surveillance program. It is not     intended to diagnose MRSA     infection nor to guide or     monitor treatment for     MRSA infections.  URINE CULTURE     Status: None   Collection Time    12/11/12  1:11 PM      Result Value Range Status   Specimen Description URINE, CLEAN CATCH   Final   Special Requests NONE   Final   Culture  Setup Time 12/11/2012 14:27   Final   Colony Count NO GROWTH   Final   Culture NO GROWTH   Final   Report Status 12/12/2012 FINAL   Final  MRSA PCR SCREENING     Status: None   Collection Time    12/13/12  5:39 PM      Result Value Range Status   MRSA by PCR  NEGATIVE  NEGATIVE Final   Comment:            The GeneXpert MRSA Assay (FDA     approved for NASAL specimens     only), is one component of a     comprehensive MRSA colonization     surveillance program. It is not     intended to diagnose MRSA     infection nor to guide or     monitor treatment for     MRSA infections.     Studies:  Recent x-ray studies have been reviewed in detail by the Attending Physician  Scheduled Meds:  Reviewed  in detail by the Attending Physician   Debbe Odea, MD Pager [phone]  If 7PM-7AM, please contact night-coverage [email or web address] Password [email or web address] 12/16/2012, 4:32 PM   LOS: 3 days

## 2012-12-17 ENCOUNTER — Ambulatory Visit: Payer: Medicare Other | Admitting: Internal Medicine

## 2012-12-17 LAB — CBC
HCT: 35.6 % — ABNORMAL LOW (ref 36.0–46.0)
Hemoglobin: 12 g/dL (ref 12.0–15.0)
MCH: 31.3 pg (ref 26.0–34.0)
MCV: 93 fL (ref 78.0–100.0)
Platelets: 343 10*3/uL (ref 150–400)
RBC: 3.83 MIL/uL — ABNORMAL LOW (ref 3.87–5.11)
WBC: 11.6 10*3/uL — ABNORMAL HIGH (ref 4.0–10.5)

## 2012-12-17 LAB — RENAL FUNCTION PANEL
Albumin: 3.1 g/dL — ABNORMAL LOW (ref 3.5–5.2)
BUN: 76 mg/dL — ABNORMAL HIGH (ref 6–23)
CO2: 22 mEq/L (ref 19–32)
Chloride: 99 mEq/L (ref 96–112)
Creatinine, Ser: 8.72 mg/dL — ABNORMAL HIGH (ref 0.50–1.10)
Glucose, Bld: 88 mg/dL (ref 70–99)
Potassium: 3.9 mEq/L (ref 3.5–5.1)

## 2012-12-17 MED ORDER — RENA-VITE PO TABS: 1.0000 | ORAL_TABLET | Freq: Every day | ORAL | Status: DC

## 2012-12-17 MED ORDER — PROPRANOLOL HCL 40 MG PO TABS: 40.0000 mg | ORAL_TABLET | Freq: Two times a day (BID) | ORAL | Status: DC

## 2012-12-17 MED ORDER — VERAPAMIL HCL ER 120 MG PO TBCR: 120.0000 mg | EXTENDED_RELEASE_TABLET | Freq: Every day | ORAL | Status: DC

## 2012-12-17 NOTE — Progress Notes (Signed)
S: According to cards- brief PAT last night.  Understands that she will go home tonight after HD with next treatment to be at [place] on Wednesday  O:BP 144/71  Pulse 80  Temp(Src) 98.2 F (36.8 C) (Oral)  Resp 18  Ht 5' 6.5" (1.689 m)  Wt 89.6 kg (197 lb 8.5 oz)  BMI 31.41 kg/m2  SpO2 97%  Intake/Output Summary (Last 24 hours) at 12/17/12 1033 Last data filed at 12/16/12 1200  Gross per 24 hour  Intake    250 ml  Output      0 ml  Net    250 ml   Weight change: -1.9 kg (-4 lb 3 oz) [id number] and alert CVS:RRR Resp:clear Abd:+BS NTND Ext:no edema  Rt UA AVF + bruit NEURO:CNI ox3 no asterixis   . aspirin EC  81 mg Oral Daily  . calcium carbonate  2 tablet Oral TID WC  . enoxaparin (LOVENOX) injection  30 mg Subcutaneous Q24H  . hydrALAZINE  50 mg Oral Q8H  . levothyroxine  175 mcg Oral Custom  . [START ON 12/18/2012] levothyroxine  262.5 mcg Oral Custom  . multivitamin  1 tablet Oral Daily  . propranolol  40 mg Oral BID  . verapamil  120 mg Oral Daily   No results found. BMET    Component Value Date/Time   NA 135 12/17/2012 0410   K 3.9 12/17/2012 0410   CL 99 12/17/2012 0410   CO2 22 12/17/2012 0410   GLUCOSE 88 12/17/2012 0410   BUN 76* 12/17/2012 0410   CREATININE 8.72* 12/17/2012 0410   CALCIUM 9.4 12/17/2012 0410   CALCIUM 8.4 08/30/2012 0819   GFRNONAA 4* 12/17/2012 0410   GFRAA 5* 12/17/2012 0410   CBC    Component Value Date/Time   WBC 11.6* 12/17/2012 0410   RBC 3.83* 12/17/2012 0410   HGB 12.0 12/17/2012 0410   HCT 35.6* 12/17/2012 0410   PLT 343 12/17/2012 0410   MCV 93.0 12/17/2012 0410   MCH 31.3 12/17/2012 0410   MCHC 33.7 12/17/2012 0410   RDW 13.7 12/17/2012 0410   LYMPHSABS 1.6 09/10/2009 0812   MONOABS 0.4 09/10/2009 0812   EOSABS 0.6 09/10/2009 0812   BASOSABS 0.1 09/10/2009 [id number]     Assessment: 67 year old WF with known advanced CKD- now new start to HD 1. Renal- new start to HD via AVF- it is working well.  Planning for discharge after HD today to get next  treatment at [facility] on Wednesday.  We will need to tell pt to not take calan/propranolol or hydralazine just prior to HD 2. Bones- renavite/oscal with meals 3. HTN/vol- I hope she will not need 3 antihypertensives eventually once gets better dialysis 4. Dispo- I talked to her home center- she will need to be there on Wednesday at 11:15 AM  to sign paperwork to get started, there will be a PA at the center on Friday to re-evaluate.     Lucus Lambertson A

## 2012-12-17 NOTE — Discharge Summary (Signed)
Physician Discharge Summary  Tabitha Green [id number] DOB: January 28, 1946 DOA: 12/13/2012  PCP: Unice Cobble, MD  Admit date: 12/13/2012 Discharge date: 12/17/2012  Time spent: >30 minutes  Recommendations for Outpatient Follow-up:  1. Begin outpatient dialysis as previously scheduled 2. Follow up with Dr. Alecia Lemming as scheduled 3. Follow up with Dr. Linna Darner - keep any previous appointments. Recommend repeat TSH in 6 weeks. 4. Follow up with Dr. Johnsie Cancel 5/29 as scheduled  Discharge Diagnoses:    SVT (supraventricular tachycardia)-resolved   Chronic kidney disease (CKD), stage IV (severe) - HD initiated this admission (M-W-F)   Hypothyroidism - TSH elevated/free T4 normal   Unspecified essential hypertension   VITAMIN D DEFICIENCY   Hyperglycemia   Chronic mild systolic dysfunction/EF [id number]   Discharge Condition: STABLE  Diet recommendation: Renal  Filed Weights   12/15/12 1818 12/17/12 0449 12/17/12 1244  Weight: 88.9 kg (195 lb 15.8 oz) 89.6 kg (197 lb 8.5 oz) 90.1 kg (198 lb 10.2 oz)    History of present illness:  67 year old female patient with known chronic kidney disease with plans in place to begin hemodialysis recently discharged from [facility] on 12/09/2012 after treatment for symptomatic sinus bradycardia. At that time she was taken off calcium channel blockers and beta blockers. She presented to the hospital because of complaints related to awareness of tachypalpitations that were symptomatic with associated clamminess and lightheadedness. No chest pain. She was found to have supraventricular tachycardia with rates between 120 130 and was given adenosine. She subsequently converted to sinus rhythm and was started on verapamil 120 mg daily after evaluation by Cardiology.   Hospital Course:   SVT (supraventricular tachycardia)  -Initially converted after a dose of adenosine but redeveloped recalcitrant SVT that was not responsive to adenosine - patient was subsequently  transferred to the ICU and started on an esmolol drip which was discontinued on 5/10  -recurrent SVT on 5/10 which spontaneously corrected  -Cardiology followed during hospital stay -Continue verapamil and Propranolol at discharge with rate well controlled at time of discharge -Patient to followup with EP in outpatient setting as arranged by cardiology  Chronic systolic CHF- noted on ECHO on 5/9  -EF 40-45% on ECHO with mod LVH  -not on ACE-dc'd TID Hydralazine in favor of previous Norvasc since CHF best managed with HD at this point  Chronic kidney disease (CKD), stage IV (severe)/Uremia  -Renal following  -dialysis M-W-F as outpatient  Hypothyroidism  -Found to have markedly elevated TSH 12/07/2012 > 66.01 - T4 at that time 1.06 - Recommend repeat TSH in 6 weeks -Documented compliance with Synthroid-unclear if dose was adjusted during that admission-was to followup with PCP   Unspecified essential hypertension  -BP poorly controlled and likely related to volume status from chronic kidney disease  -Initiation of hemodialysis has helped  -cont Norvasc, Verapamil and Propranolol  -feel would be more compliant with Norvasc vs Hydralazine  VITAMIN D DEFICIENCY   Hyperglycemia  -Serum glucose has been elevated but not significantly  -Defer repeat hemoglobin A1c to primary care provider after discharge   Procedures:  2D Echocardiogram (12/14/12) - Left ventricle: Tachycardia during the study. Technically limited study. Hypokinesis of inferior and inferolateral walls. And hypo inferior septum. The EF MAY BE in the 40-45% range. The cavity size was at the upper limits of normal. Wall thickness was increased in a pattern of moderate LVH. - Right ventricle: Not able to assess RV function due to poor visualization. Not able to assess RV size due to poor  visualization. But some data suggests RV may be OK.    Consultations:  Nephrology  Cardiology  Electrophysiology  Discharge  Exam: Filed Vitals:   12/17/12 1244 12/17/12 1300 12/17/12 1330 12/17/12 1430  BP: 148/82 156/87 138/93 148/92  Pulse: 77 78 90 89  Temp:      TempSrc:      Resp: 21 19 20 23   Height:      Weight: 90.1 kg (198 lb 10.2 oz)     SpO2:       General: No acute respiratory distress  Lungs: Clear to auscultation bilaterally without wheezes or crackles, 2 l  Cardiovascular: Regular rate and rhythm without murmur gallop or rub normal S1 and S2, no peripheral edema or JVD  Abdomen: Nontender, nondistended, soft, bowel sounds positive, no rebound, no ascites, no appreciable mass  Musculoskeletal: No significant cyanosis, clubbing of bilateral lower extremities  Neurological: Alert and oriented x 3, moves all extremities x 4 without focal neurological deficits, CN 2-12 intact   Discharge Instructions      Discharge Orders   Future Appointments Provider Department Dept Phone   12/20/2012 8:45 AM [facility] [phone]   01/03/2013 11:30 AM [name], MD [facility] [place]) [phone]   Future Orders Complete By Expires     Call MD for:  extreme fatigue  As directed     Call MD for:  persistant dizziness or light-headedness  As directed     Call MD for:  temperature >100.4  As directed     Diet general  As directed     Scheduling Instructions:      RENAL DIET    Increase activity slowly  As directed         Medication List    STOP taking these medications       ciprofloxacin 250 MG tablet  Commonly known as:  CIPRO     furosemide 80 MG tablet  Commonly known as:  LASIX     hydrALAZINE 25 MG tablet  Commonly known as:  APRESOLINE      TAKE these medications       acetaminophen 500 MG tablet  Commonly known as:  TYLENOL  Take 500-750 mg by mouth every 6 (six) hours as needed for pain.     amLODipine 10 MG tablet  Commonly known as:  NORVASC  Take 1 tablet (10 mg total) by mouth daily.      aspirin 81 MG EC tablet  Take 1 tablet (81 mg total) by mouth daily.     calcitRIOL 0.5 MCG capsule  Commonly known as:  ROCALTROL  Take 0.5 mcg by mouth daily.     CALCIUM CARBONATE PO  Take 2 tablets by mouth 3 (three) times daily.     epoetin alfa 20000 UNIT/ML injection  Commonly known as:  EPOGEN,PROCRIT  Inject 20,000 Units into the skin every 30 (thirty) days.     IRON PO  Take 2 tablets by mouth 2 (two) times daily.     levothyroxine 175 MCG tablet  Commonly known as:  SYNTHROID, LEVOTHROID  Take 175-262.5 mcg by mouth daily before breakfast. 1 tablet daily except 1.5 tablets on Tues and Thurs     multivitamin Tabs tablet  Take 1 tablet by mouth daily.     propranolol 40 MG tablet  Commonly known as:  INDERAL  Take 1 tablet (40 mg total) by mouth 2 (two) times daily.  verapamil 120 MG CR tablet  Commonly known as:  CALAN-SR  Take 1 tablet (120 mg total) by mouth daily.       Allergies  Allergen Reactions  . Cefaclor Rash  . Naproxen Rash  . Enalapril Maleate Cough  . Metoprolol Tartrate Rash    On legs   Follow-up Information   Follow up with Unice Cobble, MD. (Keep any previous appointments. Make sure though you have an appointment scheduled within the next 4 weeks)    Contact information:   [address] [facility] [address] [place] [address] [phone]       Please follow up.   Contact information:   Next dialysis treatment this Wednesday. Please arrive at [facility] by 1130 am.      Follow up with Jenkins Rouge, MD. (see below)    Contact information:   [address], [place] [address] [phone]      Microbiology: Recent Results (from the past 240 hour(s))  MRSA PCR SCREENING     Status: None   Collection Time    12/07/12  7:58 PM      Result Value Range Status   MRSA by PCR NEGATIVE  NEGATIVE Final   Comment:            The GeneXpert MRSA Assay (FDA     approved for  NASAL specimens     only), is one component of a     comprehensive MRSA colonization     surveillance program. It is not     intended to diagnose MRSA     infection nor to guide or     monitor treatment for     MRSA infections.  URINE CULTURE     Status: None   Collection Time    12/11/12  1:11 PM      Result Value Range Status   Specimen Description URINE, CLEAN CATCH   Final   Special Requests NONE   Final   Culture  Setup Time 12/11/2012 14:27   Final   Colony Count NO GROWTH   Final   Culture NO GROWTH   Final   Report Status 12/12/2012 FINAL   Final  MRSA PCR SCREENING     Status: None   Collection Time    12/13/12  5:39 PM      Result Value Range Status   MRSA by PCR NEGATIVE  NEGATIVE Final   Comment:            The GeneXpert MRSA Assay (FDA     approved for NASAL specimens     only), is one component of a     comprehensive MRSA colonization     surveillance program. It is not     intended to diagnose MRSA     infection nor to guide or     monitor treatment for     MRSA infections.     Labs: Basic Metabolic Panel:  Recent Labs Lab 12/13/12 1313 12/13/12 1344 12/14/12 0415 12/15/12 0404 12/16/12 0905 12/17/12 0410  NA 138 140 140 137 136 135  K 4.1 4.0 3.7 4.2 3.6 3.9  CL 103 112 104 102 99 99  CO2 14*  --  18* 19 22 22   GLUCOSE 187* 184* 106* 92 109* 88  BUN 68* 67* 75* 71* 60* 76*  CREATININE 9.19* 8.80* 9.60* 9.40* 7.88* 8.72*  CALCIUM 9.1  --  9.3 8.4 8.4 9.4  PHOS  --   --   --  7.3* 4.4 5.2*   Liver Function Tests:  Recent Labs Lab 12/11/12 1234 12/14/12 0415 12/15/12 0404 12/16/12 0905 12/17/12 0410  AST 24 24  --   --   --   ALT 29 21  --   --   --   ALKPHOS 73 71  --   --   --   BILITOT 0.3 0.3  --   --   --   PROT 7.2 6.9  --   --   --   ALBUMIN 3.5 3.2* 3.0* 2.9* 3.1*   CBC:  Recent Labs Lab 12/11/12 1234 12/13/12 1313 12/13/12 1344 12/14/12 0415 12/17/12 0410  WBC 11.3* 15.1*  --  13.6* 11.6*  HGB 13.0 13.6 14.3 12.8  12.0  HCT 36.6 38.5 42.0 38.5 35.6*  MCV 91.0 90.0  --  94.6 93.0  PLT 274 358  --  321 343   Signed:  ELLIS,ALLISON L.ANP  [facility] 12/17/2012, 3:31 PM  I have personally examined this patient and reviewed the entire database. I have reviewed the above note, made any necessary editorial changes, and agree with its content.  Cherene Altes, MD [facility]

## 2012-12-17 NOTE — Procedures (Signed)
Patient was seen on dialysis and the procedure was supervised.  BFR 300  Via AVF BP is  138/93.   Patient appears to be tolerating treatment well  Bryndon Cumbie A 12/17/2012

## 2012-12-17 NOTE — Progress Notes (Signed)
    Subjective:  Denies CP or dyspnea.  Telemetry with brief PAT  Objective:  Vital Signs in the last 24 hours: Temp:  [97.4 F (36.3 C)-98.2 F (36.8 C)] 98.2 F (36.8 C) (05/12 0449) Pulse Rate:  [73-126] 80 (05/12 0449) Resp:  [17-25] 18 (05/12 0449) BP: (144-167)/(71-95) 144/71 mmHg (05/12 0449) SpO2:  [97 %-100 %] 97 % (05/12 0449) Weight:  [197 lb 8.5 oz (89.6 kg)] 197 lb 8.5 oz (89.6 kg) (05/12 0449)  Intake/Output from previous day: 05/11 0701 - 05/12 0700 In: 900 [P.O.:900] Out: -   Physical Exam: WD, WN, NAD HEENT: Normal Neck: supple Lungs: clear to ascultation  Heart: regular rate and rhythm Abdomen: soft, non-tender, non-distended Extremities: no edema Neurologic: grossly intact Lab Results:  Recent Labs  12/17/12 0410  WBC 11.6*  HGB 12.0  PLT 343    Recent Labs  12/16/12 0905 12/17/12 0410  NA 136 135  K 3.6 3.9  CL 99 99  CO2 22 22  GLUCOSE 109* 88  BUN 60* 76*  CREATININE 7.88* 8.72*    Cardiac Studies: Echocardiogram 12/14/12 - Left ventricle: Tachycardia during the study. Technically limited study. Hypokinesis of inferior and inferolateral walls. And hypo inferior septum. The EF MAY BE in the 40-45% range. The cavity size was at the upper limits of normal. Wall thickness was increased in a pattern of moderate LVH. - Right ventricle: Not able to assess RV function due to poor visualization. Not able to assess RV size due to poor visualization. But some data suggests RV may be OK.  Tele: Recurrence of SVT overnight to HR 140's  Assessment/Plan:  The patient is a 67 yo woman, history of hypothyroidism, CKD, presenting with SVT.  # SVT - The patient presented with SVT, which initially resolved with adenosine, but has recurred throughout her hospitalization. No episodes overnight. Note, the patient was recently discharged after an admission for symptomatic bradycardia. -continue verapamil 120 -continue propranolol 40 BID -patient  can be DCed from a cardiac standpoint and fu with Dr Lovena Le.  # HTN - continue present meds  # AKI on CKD - patient started HD during this admission, and has now had 2 treatments to date. -management per nephrology    Kirk Ruths, M.D. 12/17/2012, 9:31 AM

## 2012-12-19 ENCOUNTER — Other Ambulatory Visit (HOSPITAL_COMMUNITY): Payer: Self-pay

## 2012-12-19 ENCOUNTER — Emergency Department (HOSPITAL_COMMUNITY)
Admission: EM | Admit: 2012-12-19 | Discharge: 2012-12-19 | Disposition: A | Discharge status: Home / Self Care | Payer: Medicare Other | Attending: Emergency Medicine | Admitting: Emergency Medicine

## 2012-12-19 ENCOUNTER — Other Ambulatory Visit: Payer: Self-pay | Admitting: General Practice

## 2012-12-19 ENCOUNTER — Encounter (HOSPITAL_COMMUNITY): Payer: Self-pay | Admitting: *Deleted

## 2012-12-19 ENCOUNTER — Telehealth: Payer: Self-pay | Admitting: General Practice

## 2012-12-19 DIAGNOSIS — I471 Supraventricular tachycardia, unspecified: Secondary | ICD-10-CM | POA: Insufficient documentation

## 2012-12-19 DIAGNOSIS — Z905 Acquired absence of kidney: Secondary | ICD-10-CM | POA: Insufficient documentation

## 2012-12-19 DIAGNOSIS — M129 Arthropathy, unspecified: Secondary | ICD-10-CM | POA: Insufficient documentation

## 2012-12-19 DIAGNOSIS — R61 Generalized hyperhidrosis: Secondary | ICD-10-CM | POA: Insufficient documentation

## 2012-12-19 DIAGNOSIS — Z85528 Personal history of other malignant neoplasm of kidney: Secondary | ICD-10-CM | POA: Insufficient documentation

## 2012-12-19 DIAGNOSIS — Z7982 Long term (current) use of aspirin: Secondary | ICD-10-CM | POA: Insufficient documentation

## 2012-12-19 DIAGNOSIS — Z992 Dependence on renal dialysis: Secondary | ICD-10-CM | POA: Insufficient documentation

## 2012-12-19 DIAGNOSIS — Z79899 Other long term (current) drug therapy: Secondary | ICD-10-CM | POA: Insufficient documentation

## 2012-12-19 DIAGNOSIS — R0602 Shortness of breath: Secondary | ICD-10-CM | POA: Insufficient documentation

## 2012-12-19 DIAGNOSIS — M899 Disorder of bone, unspecified: Secondary | ICD-10-CM | POA: Insufficient documentation

## 2012-12-19 DIAGNOSIS — I120 Hypertensive chronic kidney disease with stage 5 chronic kidney disease or end stage renal disease: Secondary | ICD-10-CM | POA: Insufficient documentation

## 2012-12-19 DIAGNOSIS — F411 Generalized anxiety disorder: Secondary | ICD-10-CM | POA: Insufficient documentation

## 2012-12-19 DIAGNOSIS — E039 Hypothyroidism, unspecified: Secondary | ICD-10-CM | POA: Insufficient documentation

## 2012-12-19 DIAGNOSIS — Z888 Allergy status to other drugs, medicaments and biological substances status: Secondary | ICD-10-CM | POA: Insufficient documentation

## 2012-12-19 DIAGNOSIS — N186 End stage renal disease: Secondary | ICD-10-CM | POA: Insufficient documentation

## 2012-12-19 DIAGNOSIS — D649 Anemia, unspecified: Secondary | ICD-10-CM | POA: Insufficient documentation

## 2012-12-19 DIAGNOSIS — Z8719 Personal history of other diseases of the digestive system: Secondary | ICD-10-CM | POA: Insufficient documentation

## 2012-12-19 LAB — BASIC METABOLIC PANEL
BUN: 21 mg/dL (ref 6–23)
Creatinine, Ser: 3.94 mg/dL — ABNORMAL HIGH (ref 0.50–1.10)
GFR calc non Af Amer: 11 mL/min — ABNORMAL LOW (ref >90)
Glucose, Bld: 179 mg/dL — ABNORMAL HIGH (ref 70–99)
Potassium: 3.1 mEq/L — ABNORMAL LOW (ref 3.5–5.1)

## 2012-12-19 LAB — CBC
HCT: 35.6 % — ABNORMAL LOW (ref 36.0–46.0)
Hemoglobin: 12.3 g/dL (ref 12.0–15.0)
MCHC: 34.6 g/dL (ref 30.0–36.0)
MCV: 90.8 fL (ref 78.0–100.0)

## 2012-12-19 MED ORDER — PROPRANOLOL HCL 40 MG PO TABS
40.0000 mg | ORAL_TABLET | Freq: Two times a day (BID) | ORAL | Status: DC
  Administered 2012-12-19: 40 mg via ORAL
  Filled 2012-12-19: qty 1

## 2012-12-19 MED ORDER — VERAPAMIL HCL ER 120 MG PO TBCR
120.0000 mg | EXTENDED_RELEASE_TABLET | Freq: Every day | ORAL | Status: DC
  Administered 2012-12-19: 120 mg via ORAL
  Filled 2012-12-19: qty 1

## 2012-12-19 MED ORDER — CLONIDINE HCL 0.2 MG PO TABS: ORAL_TABLET | ORAL | Status: DC

## 2012-12-19 MED ORDER — FUROSEMIDE 80 MG PO TABS: ORAL_TABLET | ORAL | Status: DC

## 2012-12-19 NOTE — ED Notes (Signed)
PA-C at bedside 

## 2012-12-19 NOTE — Telephone Encounter (Signed)
OK X 3 months but additional refills require office visit to update medical history.

## 2012-12-19 NOTE — ED Provider Notes (Signed)
Medical screening examination/treatment/procedure(s) were conducted as a shared visit with non-physician practitioner(s) and myself.  I personally evaluated the patient during the encounter  The patient most likely had an episode of SVT. Apparently her pulse in triage was 190. Time they did an EKG her heart rate had decreased significantly. She was monitored in the emergency department. She had no recurrent episodes. Her cardiologist was consulted. Patient states her discharge.  Kathalene Frames, MD 12/19/12 [phone]

## 2012-12-19 NOTE — ED Provider Notes (Signed)
History     CSN: [id number]  Arrival date & time 12/19/12  [id number]   First MD Initiated Contact with Patient 12/19/12 1841      Chief Complaint  Patient presents with  . Palpitations    (Consider location/radiation/quality/duration/timing/severity/associated sxs/prior treatment) Patient is a 67 y.o. female presenting with palpitations.  Palpitations    Tabitha Green is a 67 y.o. female history of he on Monday Wednesday Friday dialysis and supraventricular tachycardia presents emergency department complaining of palpitations.  Onset of symptoms began an hour after dialysis was received.  Associated symptoms include shortness of breath, diaphoresis and increasing anxiety.  Patient reports that she has not taken any of her medications today as she was told not to take them prior to dialysis.  Patient is to take her beta blocker this evening.  Note that patient was recently admitted to the hospital from 5/8-5/07/2013.  At this time patient's SVT was refractory to a dentist and and required ICU transfer for a esmolol drip.  Patient discharged 2 days ago on propanolol and verapamil.  She denies any chest pain, syncope, lightheadedness, fevers, night sweats or chills.  PCP: Dr. Linna Darner Cardiology: Dr. Johnsie Cancel    Past Medical History  Diagnosis Date  . Hypertension   . Hypothyroidism   . Gout   . Arthritis   . Anemia     r/t kidney function- receives Procrit  . Cancer     kidney  . FSGS (focal segmental glomerulosclerosis)     right kidney;  . Osteopenia   . Diverticulosis     a. 05/2012 colonoscopy  . CKD (chronic kidney disease)     followed by Dr. [name].    Past Surgical History  Procedure Laterality Date  . Total abdominal hysterectomy w/ bilateral salpingoophorectomy      fibroids  . Nephrectomy  1993    for malignancy- left  . Colonoscopy with polypectomy      Dr Henrene Pastor  . Av fistula placement      pt has not had to start dialysis  . Renal biopsy      right  .  Colonoscopy      Family History  Problem Relation Age of Onset  . Deep vein thrombosis Mother     post thyroid surgery  . Heart attack Father 12    deceased  . Cancer Sister     breast  . Cancer Paternal Aunt     pancreatic  . Diabetes Maternal Grandfather   . Stroke Paternal Grandmother     in 36s  . Colon cancer Neg Hx   . Esophageal cancer Neg Hx   . Stomach cancer Neg Hx   . Rectal cancer Neg Hx     History  Substance Use Topics  . Smoking status: Never Smoker   . Smokeless tobacco: Never Used  . Alcohol Use: Yes     Comment: rarely    OB History   Grav Para Term Preterm Abortions TAB SAB Ect Mult Living                  Review of Systems  Cardiovascular: Positive for palpitations.   Ten systems reviewed and are negative for acute change, except as noted in the HPI.    Allergies  Cefaclor; Naproxen; Enalapril maleate; and Metoprolol tartrate  Home Medications   Current Outpatient Rx  Name  Route  Sig  Dispense  Refill  . cloNIDine (CATAPRES) 0.2 MG tablet   Oral   Take  0.2 mg by mouth 2 (two) times daily.         . furosemide (LASIX) 80 MG tablet   Oral   Take 80 mg by mouth daily.         [name] acetaminophen (TYLENOL) 500 MG tablet   Oral   Take 500-750 mg by mouth every 6 (six) hours as needed for pain.         [name] amLODipine (NORVASC) 10 MG tablet   Oral   Take 1 tablet (10 mg total) by mouth daily.   30 tablet   3   . aspirin EC 81 MG EC tablet   Oral   Take 1 tablet (81 mg total) by mouth daily.         . calcitRIOL (ROCALTROL) 0.5 MCG capsule   Oral   Take 0.5 mcg by mouth daily.         [name] CALCIUM CARBONATE PO   Oral   Take 2 tablets by mouth 3 (three) times daily.         [name] epoetin alfa (EPOGEN,PROCRIT) 24401 UNIT/ML injection   Subcutaneous   Inject 20,000 Units into the skin every 30 (thirty) days.         . IRON PO   Oral   Take 2 tablets by mouth 2 (two) times daily.         [name] levothyroxine (SYNTHROID,  LEVOTHROID) 175 MCG tablet   Oral   Take 175-262.5 mcg by mouth daily before breakfast. 1 tablet daily except 1.5 tablets on Tues and Thurs         . multivitamin (RENA-VIT) TABS tablet   Oral   Take 1 tablet by mouth daily.   30 tablet   1   . propranolol (INDERAL) 40 MG tablet   Oral   Take 1 tablet (40 mg total) by mouth 2 (two) times daily.   60 tablet   1   . verapamil (CALAN-SR) 120 MG CR tablet   Oral   Take 1 tablet (120 mg total) by mouth daily.   30 tablet   1     BP 126/84  Pulse 110  Temp(Src) 97.7 F (36.5 C) (Oral)  Resp 22  SpO2 99%  Physical Exam  Nursing note and vitals reviewed. Constitutional: She is oriented to person, place, and time. She appears well-developed and well-nourished. No distress.  HENT:  Head: Normocephalic and atraumatic.  Eyes: Conjunctivae and EOM are normal.  Neck: Normal range of motion. JVD present.  Cardiovascular:  Tachycardic.  Regular rhythm.  Intact distal pulses.  Pulmonary/Chest: Effort normal.  Lungs clear to auscultation bilaterally.  Abdominal:  Soft nontender abdomen.  Musculoskeletal: Normal range of motion.  Dialysis graft and right upper extremity  Neurological: She is alert and oriented to person, place, and time.  Skin: Skin is warm. No rash noted. She is diaphoretic.  Psychiatric: She has a normal mood and affect. Her behavior is normal.    ED Course  Procedures (including critical care time)   2D Echocardiogram (12/14/12) - Left ventricle: Tachycardia during the study. Technically limited study. Hypokinesis of inferior and inferolateral walls. And hypo inferior septum. The EF MAY BE in the 40-45% range. The cavity size was at the upper limits of normal. Wall thickness was increased in a pattern of moderate LVH. - Right ventricle: Not able to assess RV function due to poor visualization. Not able to assess RV size due to poor visualization. But some data suggests RV may be  OK Labs Reviewed - No  data to display No results found.   Date: 12/19/2012  Rate: 114  Rhythm: sinus tachycardia  QRS Axis: left  Intervals: QT prolonged  ST/T Wave abnormalities: normal  Conduction Disutrbances:none  Narrative Interpretation:   Old EKG Reviewed: changes noted  Consult Maryanna Shape Cardiology: Advised to give pt her typical medication that was not taken for dialysis. Recommended continue taking medications even on HD days and to be sure to explain this to the dialysis clinic. Pt to be seen by Dr. Johnsie Cancel either this week or early next week. Office will call patient tomorrow.    No diagnosis found.     MDM  67 yo F w CKD (on MWF HD), HTN, & kidney CA. Pt was on Verapamil and Clonidine for a long time without problems. Labetalol added to her medication regimen for better BP control but had presyncope and an idioventricular rhythm. Hospitalized with symptomatic bradycardia from 5/2-5/4. Pt was dc on verapamil and Propranolol. After dc pt developed SVT refractory to adenosine and was admitted from 5/8-5/07/2013. Pt is currently instructed to take verapamil and propranolol, but not on dialysis days. Presented today c/o palpitations, SOB, and clamminess after HD. Pt has not taken her medications today. HR on arrival was 190 in triage, tachycardia improved to 114 with out medical intervention. Consult to Cardiology as above. Labs reviewed, mild hypokalemia. Case discussed with attending as well. Plan is dc w cardiology follow up. Strict return precautions discussed.           Verl Dicker, [place] 12/19/12 2156

## 2012-12-19 NOTE — ED Notes (Addendum)
Pt in c/o heart racing since this afternoon after dialysis, history of the same, pt pale and diaphoretic, pt initial HR in triage 180-200, instructed to bear down, HR decreased to 120-130

## 2012-12-19 NOTE — Telephone Encounter (Signed)
Clonidine refill request. Med alst filled on 06/20/12 #60 with 5 refills. Pt last seen on 02-23-12. Ok to fill?

## 2012-12-19 NOTE — Telephone Encounter (Signed)
Med filled, pt notified.

## 2012-12-20 ENCOUNTER — Encounter (HOSPITAL_COMMUNITY): Payer: Medicare Other

## 2012-12-20 ENCOUNTER — Telehealth: Payer: Self-pay | Admitting: Cardiovascular Disease

## 2012-12-20 NOTE — Telephone Encounter (Signed)
Follow-up:    Patient called in wanting to know what she needs to do as far as her medication is concerned about her dialysis.  Please call back.

## 2012-12-20 NOTE — Telephone Encounter (Signed)
New problem    Pt was seen at [facility] last night and was told she needs to be seen today w/dr Johnsie Cancel

## 2012-12-25 ENCOUNTER — Encounter: Payer: Medicare Other | Admitting: Cardiovascular Disease

## 2012-12-25 ENCOUNTER — Encounter: Payer: Self-pay | Admitting: Nurse Practitioner

## 2012-12-25 ENCOUNTER — Ambulatory Visit (INDEPENDENT_AMBULATORY_CARE_PROVIDER_SITE_OTHER): Payer: Medicare Other | Admitting: Nurse Practitioner

## 2012-12-25 VITALS — BP 142/84 | HR 66 | Ht 66.5 in | Wt 195.1 lb

## 2012-12-25 DIAGNOSIS — I471 Supraventricular tachycardia: Secondary | ICD-10-CM

## 2012-12-25 DIAGNOSIS — I498 Other specified cardiac arrhythmias: Secondary | ICD-10-CM

## 2012-12-25 DIAGNOSIS — R001 Bradycardia, unspecified: Secondary | ICD-10-CM

## 2012-12-25 NOTE — Progress Notes (Signed)
Tabitha Green Date of Birth: 01-06-1946 Medical Record [id number]  History of Present Illness: Tabitha Green is seen back today for a post ER visit. She is seen for Tabitha Green. She has CKD and has recently been initiated on hemodialysis (M-W-F) earlier this month (5/8 to 5/12). Has had SVT which has required Esmolol drip, hypothyroidism, HTN, and systolic dysfunction with an EF of 40 to 45%. She has apparently had symptomatic bradycardia and was admitted 12/09/2012 and had her CCB and beta blocker stopped. She was to see EP. She has marked hypothyroidism with a TSH of 66 but a normal T4. Apparently was taken off the active transplant list because she could not achieve 7 mets on her stress test but apparently with normal Cardiolite images. [name]   Has been to the ER on 5/14 after an episode of recurrent SVT. This was in the setting of her first outpatient dialysis.   She comes in today. She is here with her husband, Tabitha Green - he is also a patient of Tabitha Green. She is a little weak but overall ok. She has had 2 more dialysis sessions - this past Friday and yesterday. No more SVT. Not lightheaded or dizzy. Does get a little weak. No chest pain. Anxious to get back on the list for a transplant. Has been checking her BP at home. Readings look high but her cuff does not correlate here. Not short of breath.    Current Outpatient Prescriptions on File Prior to Visit  Medication Sig Dispense Refill  . acetaminophen (TYLENOL) 500 MG tablet Take 500-750 mg by mouth every 6 (six) hours as needed for pain.      [name] amLODipine (NORVASC) 10 MG tablet Take 1 tablet (10 mg total) by mouth daily.  30 tablet  3  . aspirin EC 81 MG EC tablet Take 1 tablet (81 mg total) by mouth daily.      . calcitRIOL (ROCALTROL) 0.5 MCG capsule Take 0.5 mcg by mouth daily.      [name] CALCIUM CARBONATE PO Take 2 tablets by mouth 3 (three) times daily.      [name] levothyroxine (SYNTHROID, LEVOTHROID) 175 MCG tablet Take 175-262.5 mcg by mouth daily  before breakfast. 1 tablet daily except 1.5 tablets on Tues and Thurs      . multivitamin (RENA-VIT) TABS tablet Take 1 tablet by mouth daily.  30 tablet  1  . SALINE NASAL SPRAY NA Place 2 sprays into the nose 2 (two) times daily as needed (congestion).      . verapamil (CALAN-SR) 120 MG CR tablet Take 1 tablet (120 mg total) by mouth daily.  30 tablet  1   No current facility-administered medications on file prior to visit.    Allergies  Allergen Reactions  . Cefaclor Rash  . Naproxen Rash  . Enalapril Maleate Cough  . Metoprolol Tartrate Rash    On legs    Past Medical History  Diagnosis Date  . Hypertension   . Hypothyroidism   . Gout   . Arthritis   . Anemia     r/t kidney function- receives Procrit  . Cancer     kidney  . FSGS (focal segmental glomerulosclerosis)     right kidney;  . Osteopenia   . Diverticulosis     a. 05/2012 colonoscopy  . CKD (chronic kidney disease)     followed by Tabitha Green.  . SVT (supraventricular tachycardia)     Past Surgical History  Procedure Laterality Date  .  Total abdominal hysterectomy w/ bilateral salpingoophorectomy      fibroids  . Nephrectomy  1993    for malignancy- left  . Colonoscopy with polypectomy      Tabitha Green  . Av fistula placement      pt has not had to start dialysis  . Renal biopsy      right  . Colonoscopy      History  Smoking status  . Never Smoker   Smokeless tobacco  . Never Used    History  Alcohol Use  . Yes    Comment: rarely    Family History  Problem Relation Age of Onset  . Deep vein thrombosis Mother     post thyroid surgery  . Heart attack Father 3    deceased  . Cancer Sister     breast  . Cancer Paternal Aunt     pancreatic  . Diabetes Maternal Grandfather   . Stroke Paternal Grandmother     in 83s  . Colon cancer Neg Hx   . Esophageal cancer Neg Hx   . Stomach cancer Neg Hx   . Rectal cancer Neg Hx     Review of Systems: The review of systems is per the HPI.   All other systems were reviewed and are negative.  Physical Exam: BP 142/84  Pulse 66  Ht 5' 6.5" (1.689 m)  Wt 195 lb 1.9 oz (88.506 kg)  BMI 31.03 kg/m2 Patient is very pleasant and in no acute distress. She does look chronically ill. Color is sallow. Skin is warm and dry.  HEENT is unremarkable. Normocephalic/atraumatic. PERRL. Sclera are nonicteric. Neck is supple. No masses. No JVD. Lungs are clear. Cardiac exam shows a regular rate and rhythm. Abdomen is soft. Extremities are without edema. Gait and ROM are intact. No gross neurologic deficits noted.  LABORATORY DATA: EKG today shows sinus rhythm. Rate is 66. She has diffuse ST & T wave changes.  Lab Results  Component Value Date   WBC 11.1* 12/19/2012   HGB 12.3 12/19/2012   HCT 35.6* 12/19/2012   PLT 350 12/19/2012   GLUCOSE 179* 12/19/2012   CHOL 180 12/08/2012   TRIG 61 12/08/2012   HDL 45 12/08/2012   LDLDIRECT 166.3 10/02/2007   LDLCALC 123* 12/08/2012   ALT 21 12/14/2012   AST 24 12/14/2012   NA 138 12/19/2012   K 3.1* 12/19/2012   CL 97 12/19/2012   CREATININE 3.94* 12/19/2012   BUN 21 12/19/2012   CO2 25 12/19/2012   TSH 66.010* 12/07/2012   INR 1.02 12/13/2012   HGBA1C 5.4 02/23/2012   Echo Study Conclusions  - Left ventricle: Tachycardia during the study. Technically limited study. Hypokinesis of inferior and inferolateral walls. And hypo inferior septum. The EF MAY BE in the 40-45% range. The cavity size was at the upper limits of normal. Wall thickness was increased in a pattern of moderate LVH. - Right ventricle: Not able to assess RV function due to poor visualization. Not able to assess RV size due to poor visualization. But some data suggests RV may be OK.  Assessment / Plan: 1. ESRD - now on dialysis - she will discuss with Tabitha Green about how to go about getting back on the transplant list.   2. SVT - no further episodes since last Wednesday. I have left her on her current regimen. She may require EP consult if this  persists. Hopefully this will settle down as we get out further and into a  more established routine with her dialysis.  3. Bradycardia - looks like this has resolved with change in medicines and initiation of dialysis.   4. HTN - her cuff does not correlate. BP by me is 120/85 and her cuff read 136/104. She will obtain a new cuff and continue to monitor.   5. Elevated TSH - will defer to Tabitha. Linna Darner but I think her medicines need adjustment. This may be impacting her rhythm as well. She was suppose to follow up with him for a post hospital visit. I will arrange.   6. Systolic heart failure - looks compensated today. Reportedly negative Cardiolite study.   I have left her on her current regimen. She will get a new BP cuff and monitor at home.   See Tabitha Green back in 2 to 3 weeks.  Patient is agreeable to this plan and will call if any problems develop in the interim.   Burtis Junes, RN, ANP-C [facility] [address] [place], [place]  [address]

## 2012-12-25 NOTE — Telephone Encounter (Signed)
PT WAS SEEN TODAY  BY Truitt Merle NP  MESSAGE WAS ADDRESSED .Adonis Housekeeper

## 2012-12-25 NOTE — Patient Instructions (Addendum)
I would like to get you an appointment with Dr. Linna Darner for follow up of your thyroid levels  Get a new BP cuff and keep a record  Stay on your current list of medicines  Ask Dr. Lorrene Reid about how to go about getting relisted for a transplant.   See Dr. Johnsie Cancel in 2 to 3 weeks with your BP & heart rate diary.  Call the [facility] office at [phone] if you have any questions, problems or concerns.

## 2013-01-03 ENCOUNTER — Other Ambulatory Visit (INDEPENDENT_AMBULATORY_CARE_PROVIDER_SITE_OTHER): Payer: Medicare Other

## 2013-01-03 ENCOUNTER — Encounter: Payer: Medicare Other | Admitting: Cardiovascular Disease

## 2013-01-03 DIAGNOSIS — E039 Hypothyroidism, unspecified: Secondary | ICD-10-CM

## 2013-01-03 LAB — TSH: TSH: 1.54 u[IU]/mL (ref 0.35–5.50)

## 2013-01-04 ENCOUNTER — Other Ambulatory Visit: Payer: Self-pay | Admitting: Internal Medicine

## 2013-01-04 DIAGNOSIS — E039 Hypothyroidism, unspecified: Secondary | ICD-10-CM

## 2013-01-07 NOTE — Telephone Encounter (Signed)
Refill #90; TSH should be repeated prior to the next refill as there was such a dramatic change in the TSH. Code 244.9

## 2013-01-07 NOTE — Telephone Encounter (Signed)
RX sent

## 2013-01-07 NOTE — Telephone Encounter (Signed)
Hopp please advise on request for thyroid medication based on recent thyroid value (WNL), major improvement when compared to previous thyroid value

## 2013-01-14 NOTE — Telephone Encounter (Signed)
lmovm for pt to call office.

## 2013-01-31 ENCOUNTER — Other Ambulatory Visit (INDEPENDENT_AMBULATORY_CARE_PROVIDER_SITE_OTHER): Payer: Medicare Other

## 2013-01-31 ENCOUNTER — Ambulatory Visit: Payer: Medicare Other | Admitting: Physician Assistant

## 2013-01-31 DIAGNOSIS — E039 Hypothyroidism, unspecified: Secondary | ICD-10-CM

## 2013-01-31 LAB — TSH: TSH: 0.28 u[IU]/mL — ABNORMAL LOW (ref 0.35–5.50)

## 2013-02-05 ENCOUNTER — Ambulatory Visit: Payer: Medicare Other | Admitting: Cardiovascular Disease

## 2013-02-19 ENCOUNTER — Encounter: Payer: Self-pay | Admitting: Physician Assistant

## 2013-02-19 ENCOUNTER — Ambulatory Visit (INDEPENDENT_AMBULATORY_CARE_PROVIDER_SITE_OTHER): Payer: Medicare Other | Admitting: Physician Assistant

## 2013-02-19 VITALS — BP 120/82 | HR 60 | Ht 66.5 in | Wt 198.0 lb

## 2013-02-19 DIAGNOSIS — I10 Essential (primary) hypertension: Secondary | ICD-10-CM

## 2013-02-19 DIAGNOSIS — E039 Hypothyroidism, unspecified: Secondary | ICD-10-CM

## 2013-02-19 DIAGNOSIS — I519 Heart disease, unspecified: Secondary | ICD-10-CM

## 2013-02-19 DIAGNOSIS — I498 Other specified cardiac arrhythmias: Secondary | ICD-10-CM

## 2013-02-19 DIAGNOSIS — N186 End stage renal disease: Secondary | ICD-10-CM

## 2013-02-19 MED ORDER — VERAPAMIL HCL ER 120 MG PO TBCR: 120.0000 mg | EXTENDED_RELEASE_TABLET | Freq: Every day | ORAL | Status: DC

## 2013-02-19 NOTE — Progress Notes (Signed)
Tabitha Green. [address], [address], [place]  [address] Phone: [phone] Fax:  [phone]  Date:  02/19/2013   ID:  JULIANI KENISON, DOB 03-29-1946, MRN [id number]  PCP:  Unice Cobble, MD  Cardiologist:  Dr. Jenkins Rouge   Electrophysiologist:  Dr. Cristopher Peru    History of Present Illness: Tabitha Green is a 66 y.o. female who returns for follow up.  She has a history of ESRD, HTN, hypothyroidism, renal carcinoma, anemia of chronic disease.  She was previously on the transplant list for renal transplant. She underwent stress testing in 2013 [facility] in [place] that was apparently normal. She was seen in the hospital in 12/2012 with symptomatic bradycardia in the setting of hyperkalemia. ARB was discontinued as well as calcium channel blocker and beta blocker. Her potassium was corrected as well as her heart rhythm. She then was readmitted with SVT. This was treated with esmolol IV. She was seen by EP and eventually placed back on verapamil and propranolol. She was started on hemodialysis during that admission. Of note, her hypothyroidism was uncontrolled. Synthroid was restarted. Echo 5/14: Technically limited, inferior and inferolateral HK, moderate LVH, EF possibly 40-45%, moderate LVH.  Last seen by Truitt Merle, NP in 12/2012.  She is doing well.  She brings in a list of her BPs.  The average reading is optimal.  The patient denies chest pain, shortness of breath, syncope, orthopnea, PND or significant pedal edema.  She denies palpitations.  Labs (5/14):  K 3.1, Cr 3.94, ALT 21, LDL 123, Hgb 12.3 Labs (6/14):  TSH 0.28  Wt Readings from Last 3 Encounters:  02/19/13 198 lb (89.812 kg)  12/25/12 195 lb 1.9 oz (88.506 kg)  12/17/12 194 lb 10.7 oz (88.3 kg)     Past Medical History  Diagnosis Date  . Hypertension   . Hypothyroidism   . Gout   . Arthritis   . Anemia     r/t kidney function- receives Procrit  . Cancer     kidney  . FSGS (focal  segmental glomerulosclerosis)     right kidney;  . Osteopenia   . Diverticulosis     a. 05/2012 colonoscopy  . CKD (chronic kidney disease)     followed by Dr. [name].  . SVT (supraventricular tachycardia)     Current Outpatient Prescriptions  Medication Sig Dispense Refill  . acetaminophen (TYLENOL) 500 MG tablet Take 500-750 mg by mouth every 6 (six) hours as needed for pain.      [name] amLODipine (NORVASC) 10 MG tablet Take 1 tablet (10 mg total) by mouth daily.  30 tablet  3  . aspirin EC 81 MG EC tablet Take 1 tablet (81 mg total) by mouth daily.      [name] CALCIUM CARBONATE PO Take 2 tablets by mouth 3 (three) times daily.      [name] epoetin alfa (EPOGEN,PROCRIT) 60454 UNIT/ML injection Inject 10,000 Units into the skin 3 (three) times a week.      . Heparin Lock Flush (HEPARIN, PORCINE, LOCK FLUSH IV) Inject 2,400 Units into the vein.      [name] levothyroxine (SYNTHROID, LEVOTHROID) 175 MCG tablet Take 175-262.5 mcg by mouth daily before breakfast. 1 tablet daily except 1.5 tablets on Tues and Thurs      . multivitamin (RENA-VIT) TABS tablet Take 1 tablet by mouth daily.  30 tablet  1  . propranolol (INDERAL) 40 MG tablet Take 40 mg by mouth 2 (two) times daily. Monday,wednesday,friday on  dialysis days only takes medication daily      . SALINE NASAL SPRAY NA Place 2 sprays into the nose 2 (two) times daily as needed (congestion).      . verapamil (CALAN-SR) 120 MG CR tablet Take 1 tablet (120 mg total) by mouth daily.  90 tablet  1   No current facility-administered medications for this visit.    Allergies:    Allergies  Allergen Reactions  . Cefaclor Rash  . Naproxen Rash  . Enalapril Maleate Cough  . Metoprolol Tartrate Rash    On legs    Social History:  The patient  reports that she has never smoked. She has never used smokeless tobacco. She reports that  drinks alcohol. She reports that she does not use illicit drugs.   ROS:  Please see the history of present illness.      All  other systems reviewed and negative.   PHYSICAL EXAM: VS:  BP 120/82  Pulse 60  Ht 5' 6.5" (1.689 m)  Wt 198 lb (89.812 kg)  BMI 31.48 kg/m2 Well nourished, well developed, in no acute distress HEENT: normal Neck: no JVD Cardiac:  normal S1, S2; RRR; no murmur Lungs:  clear to auscultation bilaterally, no wheezing, rhonchi or rales Abd: soft, nontender, no hepatomegaly Ext: no edema Skin: warm and dry Neuro:  CNs 2-12 intact, no focal abnormalities noted  EKG:  NSR, HR 60, 1st degree AVB, PR 236, NSSTTW changes     ASSESSMENT AND PLAN:  1. SVT:  No recurrence.  She is tolerating beta blocker and calcium channel blocker Rx.  Continue current Rx. 2. Hypertension:  Controlled.  Continue current therapy.  3. ESRD:  She is on dialysis.  She goes to [facility] in [place] soon for another stress test as part of her pre-transplant evaluation.  I have asked her to get a copy of her stress test sent to [place] for her records. 4. Hypothyroidism:  Managed by PCP. 5. LV Dysfunction:  Echo in the hospital demonstrated low EF but this was done in the setting of tachycardia and the study was technically limited.  Prior echo was normal.  Will ask for a copy of her upcoming stress test report. 6. Disposition:  Arrange f/u with Dr. Cristopher Peru in 3 mos.    Signed, Richardson Dopp, PA-C  02/19/2013 1:55 PM

## 2013-02-19 NOTE — Patient Instructions (Addendum)
Please ask your doctor to fax a copy of your stress test to Dr Lovena Le Vers[facility]. Our fax number is [phone].    Your physician wants you to follow-up in: 3 months with Dr Lovena Le. (October 2014).  You will receive a reminder letter in the mail two months in advance. If you don't receive a letter, please call our office to schedule the follow-up appointment.

## 2013-03-05 ENCOUNTER — Other Ambulatory Visit: Payer: Self-pay | Admitting: *Deleted

## 2013-03-05 MED ORDER — PROPRANOLOL HCL 40 MG PO TABS: 40.0000 mg | ORAL_TABLET | Freq: Two times a day (BID) | ORAL | Status: DC

## 2013-03-08 ENCOUNTER — Emergency Department
Admission: EM | Admit: 2013-03-08 | Discharge: 2013-03-08 | Disposition: A | Discharge status: Home / Self Care | Payer: Medicare Other | Source: Home / Self Care | Attending: Family Medicine | Admitting: Family Medicine

## 2013-03-08 ENCOUNTER — Encounter: Payer: Self-pay | Admitting: *Deleted

## 2013-03-08 DIAGNOSIS — S60512A Abrasion of left hand, initial encounter: Secondary | ICD-10-CM

## 2013-03-08 DIAGNOSIS — IMO0002 Reserved for concepts with insufficient information to code with codable children: Secondary | ICD-10-CM

## 2013-03-08 LAB — POCT CBC W AUTO DIFF (K'VILLE URGENT CARE)

## 2013-03-08 MED ORDER — DOXYCYCLINE HYCLATE 100 MG PO CAPS: 100.0000 mg | ORAL_CAPSULE | Freq: Two times a day (BID) | ORAL | Status: AC

## 2013-03-08 NOTE — ED Provider Notes (Signed)
CSN: [id number]     Arrival date & time 03/08/13  1812 History     First MD Initiated Contact with Patient 03/08/13 1821     Chief Complaint  Patient presents with  . Abrasion    HPI  L hand abrasion/scratch x 1 week  Pt was playing with puppy and puppy scratched her on her hand Baseline hx/o multiple medical problems inluding ESRD on HD MWF.  Had area looked at in HD. Recommended pt be seen to r/o infection.  No fevers, chills.  No purulent drainage.  Area has not worsened. Seems to be improving mildly per pt.   Past Medical History  Diagnosis Date  . Hypertension   . Hypothyroidism   . Gout   . Arthritis   . Anemia     r/t kidney function- receives Procrit  . Cancer     kidney  . FSGS (focal segmental glomerulosclerosis)     right kidney;  . Osteopenia   . Diverticulosis     a. 05/2012 colonoscopy  . CKD (chronic kidney disease)     followed by Dr. [name].  . SVT (supraventricular tachycardia)    Past Surgical History  Procedure Laterality Date  . Total abdominal hysterectomy w/ bilateral salpingoophorectomy      fibroids  . Nephrectomy  1993    for malignancy- left  . Colonoscopy with polypectomy      Dr Henrene Pastor  . Av fistula placement      pt has not had to start dialysis  . Renal biopsy      right  . Colonoscopy     Family History  Problem Relation Age of Onset  . Deep vein thrombosis Mother     post thyroid surgery  . Hypertension Mother   . Heart attack Father 38    deceased  . Hypertension Father   . Cancer Sister     breast  . Cancer Paternal Aunt     pancreatic  . Diabetes Maternal Grandfather   . Stroke Paternal Grandmother     in 69s  . Colon cancer Neg Hx   . Esophageal cancer Neg Hx   . Stomach cancer Neg Hx   . Rectal cancer Neg Hx    History  Substance Use Topics  . Smoking status: Never Smoker   . Smokeless tobacco: Never Used  . Alcohol Use: Yes     Comment: rarely   OB History   Grav Para Term Preterm Abortions TAB SAB  Ect Mult Living                 Review of Systems  All other systems reviewed and are negative.    Allergies  Cefaclor; Naproxen; Enalapril maleate; and Metoprolol tartrate  Home Medications   Current Outpatient Rx  Name  Route  Sig  Dispense  Refill  . acetaminophen (TYLENOL) 500 MG tablet   Oral   Take 500-750 mg by mouth every 6 (six) hours as needed for pain.         [name] amLODipine (NORVASC) 10 MG tablet   Oral   Take 1 tablet (10 mg total) by mouth daily.   30 tablet   3   . aspirin EC 81 MG EC tablet   Oral   Take 1 tablet (81 mg total) by mouth daily.         [name] CALCIUM CARBONATE PO   Oral   Take 2 tablets by mouth 3 (three) times daily.         [name]  epoetin alfa (EPOGEN,PROCRIT) 96295 UNIT/ML injection   Subcutaneous   Inject 10,000 Units into the skin 3 (three) times a week.         . Heparin Lock Flush (HEPARIN, PORCINE, LOCK FLUSH IV)   Intravenous   Inject 2,400 Units into the vein.         [name] levothyroxine (SYNTHROID, LEVOTHROID) 175 MCG tablet   Oral   Take 175-262.5 mcg by mouth daily before breakfast. 1 tablet daily except 1.5 tablets on Tues and Thurs         . multivitamin (RENA-VIT) TABS tablet   Oral   Take 1 tablet by mouth daily.   30 tablet   1   . propranolol (INDERAL) 40 MG tablet   Oral   Take 1 tablet (40 mg total) by mouth 2 (two) times daily. Monday,wednesday,friday on dialysis days only takes medication daily   60 tablet   0   . SALINE NASAL SPRAY NA   Nasal   Place 2 sprays into the nose 2 (two) times daily as needed (congestion).         . verapamil (CALAN-SR) 120 MG CR tablet   Oral   Take 1 tablet (120 mg total) by mouth daily.   90 tablet   1    BP 146/83  Pulse 82  Temp(Src) 98.2 F (36.8 C) (Oral)  Resp 18  Ht 5' 6.5" (1.689 m)  Wt 197 lb 8 oz (89.585 kg)  BMI 31.4 kg/m2  SpO2 98% Physical Exam  Constitutional: She appears well-developed and well-nourished.  HENT:  Head: Normocephalic and  atraumatic.  Eyes: Conjunctivae are normal. Pupils are equal, round, and reactive to light.  Neck: Normal range of motion.  Cardiovascular: Normal rate and regular rhythm.   Pulmonary/Chest: Effort normal.  Abdominal: Soft.  Musculoskeletal: Normal range of motion.  Neurological: She is alert.  Skin: Skin is warm.    ED Course   Procedures (including critical care time)  Labs Reviewed - No data to display No results found. 1. Hand abrasion, left, initial encounter     MDM  Affected area is overall well appearing with no signs of infection.  WBC at 7.  Afebrile Will give ppx rx for doxy if sxs fail to improve (PCN/Cephalosporin allergy) Discussed infectious and systemic red flags at length.  Follow up with PCP in 3-5 days.     The patient and/or caregiver has been counseled thoroughly with regard to treatment plan and/or medications prescribed including dosage, schedule, interactions, rationale for use, and possible side effects and they verbalize understanding. Diagnoses and expected course of recovery discussed and will return if not improved as expected or if the condition worsens. Patient and/or caregiver verbalized understanding.      Shanda Howells, MD 03/08/13 [phone]

## 2013-03-08 NOTE — ED Notes (Signed)
Pt c/o an abrasion to her LT hand x 1wk ago.

## 2013-03-12 ENCOUNTER — Telehealth: Payer: Self-pay | Admitting: *Deleted

## 2013-03-12 NOTE — Telephone Encounter (Signed)
Scrip phoned to [employer]. AG cma

## 2013-03-13 ENCOUNTER — Other Ambulatory Visit: Payer: Self-pay

## 2013-03-18 ENCOUNTER — Other Ambulatory Visit: Payer: Self-pay | Admitting: Internal Medicine

## 2013-03-18 DIAGNOSIS — Z1231 Encounter for screening mammogram for malignant neoplasm of breast: Secondary | ICD-10-CM

## 2013-03-22 ENCOUNTER — Ambulatory Visit (HOSPITAL_BASED_OUTPATIENT_CLINIC_OR_DEPARTMENT_OTHER)
Admission: RE | Admit: 2013-03-22 | Discharge: 2013-03-22 | Disposition: A | Discharge status: Home / Self Care | Payer: Medicare Other | Source: Ambulatory Visit | Attending: Internal Medicine | Admitting: Internal Medicine

## 2013-03-22 DIAGNOSIS — Z1231 Encounter for screening mammogram for malignant neoplasm of breast: Secondary | ICD-10-CM | POA: Insufficient documentation

## 2013-04-08 ENCOUNTER — Other Ambulatory Visit: Payer: Self-pay | Admitting: Internal Medicine

## 2013-04-10 NOTE — Telephone Encounter (Signed)
Med filled. Pt needs an OV for additional refills last seen in 2013.

## 2013-04-18 ENCOUNTER — Other Ambulatory Visit: Payer: Self-pay

## 2013-04-18 MED ORDER — AMLODIPINE BESYLATE 10 MG PO TABS: 10.0000 mg | ORAL_TABLET | Freq: Every day | ORAL | Status: DC

## 2013-04-21 ENCOUNTER — Other Ambulatory Visit: Payer: Self-pay | Admitting: Internal Medicine

## 2013-04-22 NOTE — Telephone Encounter (Signed)
Med filled.  

## 2013-05-06 ENCOUNTER — Encounter (HOSPITAL_COMMUNITY): Payer: Self-pay | Admitting: Adult Health

## 2013-05-06 ENCOUNTER — Emergency Department (HOSPITAL_COMMUNITY)
Admission: EM | Admit: 2013-05-06 | Discharge: 2013-05-07 | Disposition: A | Discharge status: Home / Self Care | Payer: Medicare Other | Attending: Emergency Medicine | Admitting: Emergency Medicine

## 2013-05-06 DIAGNOSIS — E039 Hypothyroidism, unspecified: Secondary | ICD-10-CM | POA: Insufficient documentation

## 2013-05-06 DIAGNOSIS — M899 Disorder of bone, unspecified: Secondary | ICD-10-CM | POA: Insufficient documentation

## 2013-05-06 DIAGNOSIS — Z888 Allergy status to other drugs, medicaments and biological substances status: Secondary | ICD-10-CM | POA: Insufficient documentation

## 2013-05-06 DIAGNOSIS — N186 End stage renal disease: Secondary | ICD-10-CM | POA: Insufficient documentation

## 2013-05-06 DIAGNOSIS — I120 Hypertensive chronic kidney disease with stage 5 chronic kidney disease or end stage renal disease: Secondary | ICD-10-CM | POA: Insufficient documentation

## 2013-05-06 DIAGNOSIS — Z85528 Personal history of other malignant neoplasm of kidney: Secondary | ICD-10-CM | POA: Insufficient documentation

## 2013-05-06 DIAGNOSIS — M109 Gout, unspecified: Secondary | ICD-10-CM | POA: Insufficient documentation

## 2013-05-06 DIAGNOSIS — R51 Headache: Secondary | ICD-10-CM | POA: Insufficient documentation

## 2013-05-06 DIAGNOSIS — E860 Dehydration: Secondary | ICD-10-CM | POA: Insufficient documentation

## 2013-05-06 DIAGNOSIS — M129 Arthropathy, unspecified: Secondary | ICD-10-CM | POA: Insufficient documentation

## 2013-05-06 DIAGNOSIS — N390 Urinary tract infection, site not specified: Secondary | ICD-10-CM

## 2013-05-06 DIAGNOSIS — Z992 Dependence on renal dialysis: Secondary | ICD-10-CM | POA: Insufficient documentation

## 2013-05-06 DIAGNOSIS — I951 Orthostatic hypotension: Secondary | ICD-10-CM | POA: Insufficient documentation

## 2013-05-06 DIAGNOSIS — Z87448 Personal history of other diseases of urinary system: Secondary | ICD-10-CM | POA: Insufficient documentation

## 2013-05-06 DIAGNOSIS — D649 Anemia, unspecified: Secondary | ICD-10-CM | POA: Insufficient documentation

## 2013-05-06 DIAGNOSIS — M6281 Muscle weakness (generalized): Secondary | ICD-10-CM | POA: Insufficient documentation

## 2013-05-06 DIAGNOSIS — Z7982 Long term (current) use of aspirin: Secondary | ICD-10-CM | POA: Insufficient documentation

## 2013-05-06 DIAGNOSIS — Z79899 Other long term (current) drug therapy: Secondary | ICD-10-CM | POA: Insufficient documentation

## 2013-05-06 DIAGNOSIS — R42 Dizziness and giddiness: Secondary | ICD-10-CM | POA: Insufficient documentation

## 2013-05-06 LAB — BASIC METABOLIC PANEL
BUN: 16 mg/dL (ref 6–23)
Calcium: 9.4 mg/dL (ref 8.4–10.5)
GFR calc Af Amer: 13 mL/min — ABNORMAL LOW (ref >90)
GFR calc non Af Amer: 11 mL/min — ABNORMAL LOW (ref >90)
Glucose, Bld: 96 mg/dL (ref 70–99)
Sodium: 140 mEq/L (ref 135–145)

## 2013-05-06 LAB — CBC WITH DIFFERENTIAL/PLATELET
Basophils Relative: 0 % (ref 0–1)
Eosinophils Absolute: 0.3 10*3/uL (ref 0.0–0.7)
Eosinophils Relative: 4 % (ref 0–5)
Lymphs Abs: 2.2 10*3/uL (ref 0.7–4.0)
MCH: 30.7 pg (ref 26.0–34.0)
MCHC: 33.8 g/dL (ref 30.0–36.0)
MCV: 90.7 fL (ref 78.0–100.0)
Monocytes Relative: 7 % (ref 3–12)
Neutrophils Relative %: 60 % (ref 43–77)
Platelets: 263 10*3/uL (ref 150–400)
RBC: 3.75 MIL/uL — ABNORMAL LOW (ref 3.87–5.11)

## 2013-05-06 MED ORDER — SODIUM CHLORIDE 0.9 % IV BOLUS (SEPSIS)
500.0000 mL | Freq: Once | INTRAVENOUS | Status: AC
  Administered 2013-05-06: 500 mL via INTRAVENOUS

## 2013-05-06 NOTE — ED Notes (Signed)
Presents with 2-3 months of feeling "like I am off balance and my head is really full, happens all the time. I have a lot of pressure in my sinuses" neurologically intact.  Today came to ER due to feeling "not right and the off balance feeling was there and it was after dialysis and I just felt weak all over and my blood pressure was low for me when I stood up and when I sat down it was higher"

## 2013-05-06 NOTE — ED Notes (Signed)
Pt states she was diagnosed with vertigo a while ago but has not been back to see that doctor in a while. Pt c/o feeling off balance, head feeling full x 2-3wks. Pt states she has been taking Claritin for sx but has had no relief.

## 2013-05-06 NOTE — ED Notes (Signed)
Pt brought to ED from dialysis after tx was complete.  Pt to ED c/o viertigo.

## 2013-05-06 NOTE — ED Provider Notes (Signed)
CSN: [id number]     Arrival date & time 05/06/13  1735 History   First MD Initiated Contact with Patient 05/06/13 2256     Chief Complaint  Patient presents with  . Dizziness   (Consider location/radiation/quality/duration/timing/severity/associated sxs/prior Treatment) HPI 67 yo female presents to the ER from home with complaint of weakness, head pressure and slight dizziness ongoing for the last 2-3 months since starting dialysis.  She reports she has had similar symptoms for years, but has been more noticeable since May, when she started dialysis.  Today after dialysis she was more symptomatic, with low bp upon standing.  No vertigo-no spinning sensation.  She has had vertigo in the past, and this is not it.  No fevers, chills, n/v/d.  She is eating and drinking well.  No chest pain, sob, abd pain.  Pt is in process of w/u for renal tx, has stress test tomorrow.    Past Medical History  Diagnosis Date  . Hypertension   . Hypothyroidism   . Gout   . Arthritis   . Anemia     r/t kidney function- receives Procrit  . Cancer     kidney  . FSGS (focal segmental glomerulosclerosis)     right kidney;  . Osteopenia   . Diverticulosis     a. 05/2012 colonoscopy  . CKD (chronic kidney disease)     followed by Dr. [name].  . SVT (supraventricular tachycardia)    Past Surgical History  Procedure Laterality Date  . Total abdominal hysterectomy w/ bilateral salpingoophorectomy      fibroids  . Nephrectomy  1993    for malignancy- left  . Colonoscopy with polypectomy      Dr Henrene Pastor  . Av fistula placement      pt has not had to start dialysis  . Renal biopsy      right  . Colonoscopy     Family History  Problem Relation Age of Onset  . Deep vein thrombosis Mother     post thyroid surgery  . Hypertension Mother   . Heart attack Father 76    deceased  . Hypertension Father   . Cancer Sister     breast  . Cancer Paternal Aunt     pancreatic  . Diabetes Maternal Grandfather   .  Stroke Paternal Grandmother     in 62s  . Colon cancer Neg Hx   . Esophageal cancer Neg Hx   . Stomach cancer Neg Hx   . Rectal cancer Neg Hx    History  Substance Use Topics  . Smoking status: Never Smoker   . Smokeless tobacco: Never Used  . Alcohol Use: Yes     Comment: rarely   OB History   Grav Para Term Preterm Abortions TAB SAB Ect Mult Living                 Review of Systems  All other systems reviewed and are negative.  Other than listed in hpi  Allergies  Cefaclor; Naproxen; Enalapril maleate; and Metoprolol tartrate  Home Medications   Current Outpatient Rx  Name  Route  Sig  Dispense  Refill  . acetaminophen (TYLENOL) 500 MG tablet   Oral   Take 500-750 mg by mouth every 6 (six) hours as needed for pain.         [name] amLODipine (NORVASC) 10 MG tablet   Oral   Take 1 tablet (10 mg total) by mouth daily.   30 tablet  3   . aspirin EC 81 MG EC tablet   Oral   Take 1 tablet (81 mg total) by mouth daily.         Skipper Cliche SALINE NASAL DROPS NA   Each Nare   Place 2 sprays into both nostrils daily.         [name] CALCIUM CARBONATE PO   Oral   Take 2 tablets by mouth 3 (three) times daily.         . Camphor-Eucalyptus-Menthol (VICKS VAPORUB EX)   Apply externally   Apply 1 application topically daily.         [name] levothyroxine (SYNTHROID, LEVOTHROID) 175 MCG tablet      take 1 tablet by mouth daily EXCEPT ON WED TAKE 1 AND 1/2   32 tablet   0     Pt needs an OV for additional refills.   [name] loratadine (CLARITIN) 10 MG tablet   Oral   Take 20 mg by mouth as needed for allergies.         . multivitamin (RENA-VIT) TABS tablet   Oral   Take 1 tablet by mouth daily.   30 tablet   1   . propranolol (INDERAL) 40 MG tablet      take 1 tablet by mouth ON TUES, THURS, SAT, AND SUN, TWICE DAILY AND 1 TABLET ON MON,WED,AND FRIDAY   44 tablet   0   . SALINE NASAL SPRAY NA   Nasal   Place 2 sprays into the nose 2 (two) times daily as needed  (congestion).         . verapamil (CALAN-SR) 120 MG CR tablet   Oral   Take 1 tablet (120 mg total) by mouth daily.   90 tablet   1    BP 91/66  Pulse 94  Temp(Src) 98.1 F (36.7 C) (Oral)  Resp 16  SpO2 96% Physical Exam  Nursing note and vitals reviewed. Constitutional: She is oriented to person, place, and time. She appears well-developed and well-nourished.  HENT:  Head: Normocephalic and atraumatic.  Right Ear: External ear normal.  Left Ear: External ear normal.  Nose: Nose normal.  Mouth/Throat: Oropharynx is clear and moist.  Eyes: Conjunctivae and EOM are normal. Pupils are equal, round, and reactive to light.  Neck: Normal range of motion. Neck supple. No JVD present. No tracheal deviation present. No thyromegaly present.  Cardiovascular: Normal rate, regular rhythm, normal heart sounds and intact distal pulses.  Exam reveals no gallop and no friction rub.   No murmur heard. Pulmonary/Chest: Effort normal and breath sounds normal. No stridor. No respiratory distress. She has no wheezes. She has no rales. She exhibits no tenderness.  Abdominal: Soft. Bowel sounds are normal. She exhibits no distension and no mass. There is no tenderness. There is no rebound and no guarding.  Musculoskeletal: Normal range of motion. She exhibits no edema and no tenderness.  Lymphadenopathy:    She has no cervical adenopathy.  Neurological: She is alert and oriented to person, place, and time. She has normal reflexes. No cranial nerve deficit. She exhibits normal muscle tone. Coordination normal.  Skin: Skin is warm and dry. No rash noted. No erythema. No pallor.  Psychiatric: She has a normal mood and affect. Her behavior is normal. Judgment and thought content normal.    ED Course  Procedures (including critical care time) Labs Review Labs Reviewed  CBC WITH DIFFERENTIAL - Abnormal; Notable for the following:    RBC 3.75 (*)  Hemoglobin 11.5 (*)    HCT 34.0 (*)    All other  components within normal limits  BASIC METABOLIC PANEL - Abnormal; Notable for the following:    Potassium 3.3 (*)    Creatinine, Ser 3.84 (*)    GFR calc non Af Amer 11 (*)    GFR calc Af Amer 13 (*)    All other components within normal limits  URINALYSIS, ROUTINE W REFLEX MICROSCOPIC - Abnormal; Notable for the following:    APPearance CLOUDY (*)    pH 8.5 (*)    Hgb urine dipstick MODERATE (*)    Ketones, ur 15 (*)    Protein, ur >300 (*)    Leukocytes, UA MODERATE (*)    All other components within normal limits  URINE MICROSCOPIC-ADD ON - Abnormal; Notable for the following:    Squamous Epithelial / LPF MANY (*)    Bacteria, UA MANY (*)    Casts HYALINE CASTS (*)    All other components within normal limits  URINE CULTURE   Imaging Review No results found.  MDM   1. Dehydration   2. Dizziness   3. UTI (lower urinary tract infection)    67 yo female with dizziness, slight orthostasis today, possible dehydration/over dialysis.  Labs stable.  Exam benign.  Will check ua for possible infection.   Kalman Drape, MD 05/07/13 [phone]

## 2013-05-07 LAB — URINE MICROSCOPIC-ADD ON

## 2013-05-07 LAB — URINALYSIS, ROUTINE W REFLEX MICROSCOPIC
Bilirubin Urine: NEGATIVE
Nitrite: NEGATIVE
Protein, ur: >300 mg/dL — AB
Urobilinogen, UA: 0.2 mg/dL (ref 0.0–1.0)

## 2013-05-07 MED ORDER — SULFAMETHOXAZOLE-TMP DS 800-160 MG PO TABS: 1.0000 | ORAL_TABLET | Freq: Two times a day (BID) | ORAL | Status: DC

## 2013-05-07 MED ORDER — SULFAMETHOXAZOLE-TMP DS 800-160 MG PO TABS
1.0000 | ORAL_TABLET | Freq: Once | ORAL | Status: AC
  Administered 2013-05-07: 1 via ORAL
  Filled 2013-05-07: qty 1

## 2013-05-08 LAB — URINE CULTURE
Colony Count: NO GROWTH
Culture: NO GROWTH

## 2013-05-16 ENCOUNTER — Other Ambulatory Visit: Payer: Self-pay | Admitting: Internal Medicine

## 2013-05-16 ENCOUNTER — Encounter: Payer: Self-pay | Admitting: Internal Medicine

## 2013-05-16 ENCOUNTER — Ambulatory Visit (INDEPENDENT_AMBULATORY_CARE_PROVIDER_SITE_OTHER): Payer: Medicare Other | Admitting: Internal Medicine

## 2013-05-16 VITALS — BP 133/77 | HR 65 | Temp 98.7°F | Wt 200.4 lb

## 2013-05-16 DIAGNOSIS — J310 Chronic rhinitis: Secondary | ICD-10-CM

## 2013-05-16 DIAGNOSIS — E039 Hypothyroidism, unspecified: Secondary | ICD-10-CM

## 2013-05-16 DIAGNOSIS — I951 Orthostatic hypotension: Secondary | ICD-10-CM

## 2013-05-16 MED ORDER — FLUTICASONE PROPIONATE 50 MCG/ACT NA SUSP: 1.0000 | Freq: Two times a day (BID) | NASAL | Status: DC | PRN

## 2013-05-16 NOTE — Patient Instructions (Signed)
Plain Mucinex (NOT D) for thick secretions ;force NON dairy fluids .   Nasal cleansing in the shower as discussed with lather of mild shampoo.After 10 seconds wash off lather while  exhaling through nostrils. Make sure that all residual soap is removed to prevent irritation.  Fluticasone 1 spray in each nostril twice a day as needed. Use the "crossover" technique into opposite nostril spraying toward opposite ear @ 45 degree angle, not straight up into nostril.  Use a Neti pot daily only  as needed for significant sinus congestion; going from open side to congested side . Plain Allegra (NOT D )  160 daily , Loratidine 10 mg , OR Zyrtec 10 mg @ bedtime  as needed for itchy eyes & sneezing.   Perform isometric exercise of calves  ( while seated go up on toes to count of 5 & then onto heels for 5 count). Repeat  4- 5 times prior to standing if you've been seated or supine for any significant period of time as BP drops with such positions.

## 2013-05-16 NOTE — Progress Notes (Signed)
Subjective:    Patient ID: Tabitha Green, female    DOB: 02-22-1946, 67 y.o.   MRN: [id number]  HPI  Symptoms began 3-4 months ago as intermittent sensation of head fullness associated with some imbalance.  She will feel hot and clammy and weak intermittently, more so since she started dialysis with continuation of her blood pressure medicines. Blood pressure can be markedly variable.Another trigger may be being in a crowd. She was checked at the fire department for an episode; no definite etiology was found. She was treated in the ER 9/29 also. She was diagnosed as having postural hypotension, dehydration, and urinary tract infection. Antibiotics were Rxed. The culture revealed no growth ( I informed her of results).  Despite these negative evaluations she remains anxious about her symptoms. She relates this to being taken off the transplant list. She is hoping this will change.  Modified Bruce stress test was negative 9/30 in [place].  She does have intermittent sweating which is not always nocturnal.  She describes minor sneezing.  Her symptoms of head "fullness " have improved with Claritin and nasal saline.  She does have a past history of benign positional vertigo    Review of Systems    She denies significant extrinsic symptoms of itchy or watery eyes. She has no fever, chills, frontal headache, facial pain, nasal purulence, dental pain, sore throat, or otic discharge. She does pressure in ears at times.  She's had no seizure stigmata or loss of consciousness   TSH monitor due        Objective:   Physical Exam Gen.:  Adequately nourished in appearance but complexion sallow. Alert, appropriate and cooperative throughout exam.  Head: Normocephalic without obvious abnormalities  Eyes: No corneal or conjunctival inflammation noted. Pupils unequal r; OS > OD ( chronic finding)  Extraocular motion intact.  Ears: External  ear exam reveals no significant lesions or  deformities. Canals clear .TMs normal. Hearing is grossly normal bilaterally. Nose: External nasal exam reveals no deformity or inflammation. Nasal mucosa are pink and moist. No lesions or exudates noted.   Mouth: Oral mucosa and oropharynx reveal no lesions or exudates. Teeth in good repair. Neck: No deformities, masses, or tenderness noted. Range of motion & Thyroid normal Lungs: Normal respiratory effort; chest expands symmetrically. Lungs are clear to auscultation without rales, wheezes, or increased work of breathing. Heart: Normal rate and rhythm. Normal S1 and S2. No gallop, click, or rub. R base murmur & R carotid bruit from fistula RUE.                                  Musculoskeletal/extremities:  No clubbing, cyanosis, edema, or significant extremity  deformity noted. Range of motion normal .Tone & strength  Normal. Joints normal . Nail health good. Vascular: Carotid, radial artery, dorsalis pedis and  posterior tibial pulses are full and equal.  Neurologic: Alert and oriented x3. Deep tendon reflexes symmetrical and normal.  Gait normal          Skin: Intact without suspicious lesions or rashes. Lymph: No cervical, axillary lymphadenopathy present. Psych: Mood and affect are normal. Normally interactive  Assessment & Plan:  #1 non allergic rhinitis #2 ? Postural hypotension #3 anxiety #4 hypothyroidism See Orders

## 2013-05-16 NOTE — Telephone Encounter (Signed)
Propranolol refill sent to pharmacy

## 2013-06-04 ENCOUNTER — Ambulatory Visit (INDEPENDENT_AMBULATORY_CARE_PROVIDER_SITE_OTHER): Payer: Medicare Other | Admitting: Internal Medicine

## 2013-06-04 ENCOUNTER — Encounter: Payer: Self-pay | Admitting: Internal Medicine

## 2013-06-04 VITALS — BP 133/76 | HR 59 | Ht 66.0 in | Wt 201.0 lb

## 2013-06-04 DIAGNOSIS — I471 Supraventricular tachycardia: Secondary | ICD-10-CM

## 2013-06-04 DIAGNOSIS — I498 Other specified cardiac arrhythmias: Secondary | ICD-10-CM

## 2013-06-04 NOTE — Assessment & Plan Note (Signed)
Her symptoms are well controlled. She will continue her current meds.We discussed the indications for catheter ablation. Because her symptoms are well controlled on medical therapy which she would need for her HTN anyway, I have recommended she continue as she is. We would reconsider ablation if her symptoms were to worsen.

## 2013-06-04 NOTE — Progress Notes (Signed)
HPI Tabitha Green returns today for followup. She is a pleasant middle aged woman, with a h/o SVT on multiple medications. The patient has ESRD on HD. She is considering renal transplant and is undergoing evaluation. She denies chest pain, sob, or syncope. No edema. She has done well with a combination of calcium channel blocker and beta blockers with no sustained SVT.  Allergies  Allergen Reactions  . Cefaclor Rash  . Naproxen Rash  . Enalapril Maleate Cough  . Metoprolol Tartrate Rash    On legs     Current Outpatient Prescriptions  Medication Sig Dispense Refill  . acetaminophen (TYLENOL) 500 MG tablet Take 500-750 mg by mouth every 6 (six) hours as needed for pain.      [name] amLODipine (NORVASC) 10 MG tablet Take 1 tablet (10 mg total) by mouth daily.  30 tablet  3  . aspirin EC 81 MG EC tablet Take 1 tablet (81 mg total) by mouth daily.      Skipper Cliche SALINE NASAL DROPS NA Place 2 sprays into both nostrils daily.      [name] CALCIUM CARBONATE PO Take 2 tablets by mouth 3 (three) times daily.      . Camphor-Eucalyptus-Menthol (VICKS VAPORUB EX) Apply 1 application topically daily.      . fluticasone (FLONASE) 50 MCG/ACT nasal spray Place 1 spray into the nose 2 (two) times daily as needed for rhinitis.  16 g  2  . levothyroxine (SYNTHROID, LEVOTHROID) 175 MCG tablet take 1 tablet by mouth daily EXCEPT ON Tues and Thurs TAKE 1 AND 1/2      . loratadine (CLARITIN) 10 MG tablet Take 20 mg by mouth as needed for allergies.      . multivitamin (RENA-VIT) TABS tablet Take 1 tablet by mouth daily.  30 tablet  1  . propranolol (INDERAL) 40 MG tablet TAKE 1 TAB BY MOUTH ON TUES, THURS, SAT AND SUN, TWICE DAILY AND 1 TAB ON MON, WED, AND FRI.  44 tablet  0  . verapamil (CALAN-SR) 120 MG CR tablet Take 1 tablet (120 mg total) by mouth daily.  90 tablet  1   No current facility-administered medications for this visit.     Past Medical History  Diagnosis Date  . Hypertension   . Hypothyroidism     . Gout   . Arthritis   . Anemia     r/t kidney function- receives Procrit  . Cancer     kidney  . FSGS (focal segmental glomerulosclerosis)     right kidney;  . Osteopenia   . Diverticulosis     a. 05/2012 colonoscopy  . CKD (chronic kidney disease)     followed by Dr. [name].  . SVT (supraventricular tachycardia)     ROS:   All systems reviewed and negative except as noted in the HPI.   Past Surgical History  Procedure Laterality Date  . Total abdominal hysterectomy w/ bilateral salpingoophorectomy      fibroids  . Nephrectomy  1993    for malignancy- left  . Colonoscopy with polypectomy      Dr Henrene Pastor  . Av fistula placement      pt has not had to start dialysis  . Renal biopsy      right  . Colonoscopy       Family History  Problem Relation Age of Onset  . Deep vein thrombosis Mother     post thyroid surgery  . Hypertension Mother   .  Heart attack Father 72    deceased  . Hypertension Father   . Cancer Sister     breast  . Cancer Paternal Aunt     pancreatic  . Diabetes Maternal Grandfather   . Stroke Paternal Grandmother     in 11s  . Colon cancer Neg Hx   . Esophageal cancer Neg Hx   . Stomach cancer Neg Hx   . Rectal cancer Neg Hx      History   Social History  . Marital Status: Married    Spouse Name: N/A    Number of Children: N/A  . Years of Education: N/A   Occupational History  . Not on file.   Social History Main Topics  . Smoking status: Never Smoker   . Smokeless tobacco: Never Used  . Alcohol Use: Yes     Comment: rarely  . Drug Use: No  . Sexual Activity: Not Currently   Other Topics Concern  . Not on file   Social History Narrative   Lives in [place] with husband.  Retired.  Previously worked in [employer] @ [employer]     BP 133/76  Pulse 59  Ht 5\' 6"  (1.676 m)  Wt 201 lb (91.173 kg)  BMI 32.46 kg/m2  Physical Exam:  Stable appearing middle aged woman, NAD HEENT: Unremarkable Neck:  No JVD, no  thyromegally Back:  No CVA tenderness Lungs:  Clear with no wheezes, rales, or rhonchi HEART:  Regular rate rhythm, no murmurs, no rubs, no clicks Abd:  soft, positive bowel sounds, no organomegally, no rebound, no guarding Ext:  2 plus pulses, no edema, no cyanosis, no clubbing Skin:  No rashes no nodules Neuro:  CN II through XII intact, motor grossly intact  EKG - nsr with first degree AV block   Assess/Plan:

## 2013-06-04 NOTE — Patient Instructions (Signed)
Your physician recommends that you schedule a follow-up appointment as needed  

## 2013-06-12 ENCOUNTER — Other Ambulatory Visit: Payer: Self-pay | Admitting: Internal Medicine

## 2013-06-12 NOTE — Telephone Encounter (Signed)
Propranolol refill sent to pharmcy

## 2013-06-13 ENCOUNTER — Other Ambulatory Visit: Payer: Self-pay | Admitting: Internal Medicine

## 2013-06-13 NOTE — Telephone Encounter (Signed)
Levothyroxine refill sent to pharmacy 

## 2013-07-23 ENCOUNTER — Ambulatory Visit (INDEPENDENT_AMBULATORY_CARE_PROVIDER_SITE_OTHER): Payer: Medicare Other | Admitting: Internal Medicine

## 2013-07-23 ENCOUNTER — Encounter: Payer: Self-pay | Admitting: Internal Medicine

## 2013-07-23 VITALS — BP 112/78 | HR 80 | Temp 100.1°F | Resp 16 | Wt 204.4 lb

## 2013-07-23 DIAGNOSIS — I498 Other specified cardiac arrhythmias: Secondary | ICD-10-CM

## 2013-07-23 DIAGNOSIS — J069 Acute upper respiratory infection, unspecified: Secondary | ICD-10-CM

## 2013-07-23 DIAGNOSIS — R509 Fever, unspecified: Secondary | ICD-10-CM

## 2013-07-23 DIAGNOSIS — I471 Supraventricular tachycardia: Secondary | ICD-10-CM

## 2013-07-23 NOTE — Progress Notes (Signed)
   Subjective:    Patient ID: Tabitha Green, female    DOB: November 21, 1945, 67 y.o.   MRN: [id number]  HPI Symptoms began 07/20/13; she's had frontal sinus and maxillary sinus area pain but this is not an active issue at this time. Her fever has been up to 100.1 as of today.  Last week she had a sore throat this is also resolved. She had some pressure in ears but that also is inactive at this time.  She has had insignificant sputum production.  She also had some sneezing. She describes some nausea , anorexia & malaise.  She is basically here because she planned to go out of town to see her daughter who is scheduled to have surgery in [place], [place]. She did not want to jeopardize her daughter's health.  She took the flu shot.    Review of Systems  At this time she denies frontal headache, facial pain, nasal purulence, sore throat, otic pain, otic discharge, cough, sputum, or shortness of breath. No arthralgias or myalgias  She continues to have some fatigue but has improved compared to 12/15. 89/60-137/83. She is on TWO  calcium channel blockers and a Beta blocker because of a history of supraventricular tachycardia.     Objective:   Physical Exam  General appearance:adequately nourished; no acute distress or increased work of breathing is present.  No  lymphadenopathy about the head, neck, or axilla noted.   Eyes: No conjunctival inflammation or lid edema is present. There is no scleral icterus.  Ears:  External ear exam shows no significant lesions or deformities.  Otoscopic examination reveals clear canals, tympanic membranes are intact bilaterally without bulging, retraction, inflammation or discharge.  Nose:  External nasal examination shows no deformity or inflammation. Nasal mucosa are pink and moist without lesions or exudates. No septal dislocation or deviation.No obstruction to airflow. Slight hyponasal speech  Oral exam: Dental hygiene is good; lips and gums are  healthy appearing.There is no oropharyngeal erythema or exudate noted.   Neck:  No deformities, thyromegaly, masses, or tenderness noted.   Supple with full range of motion without pain.   Heart:  Normal rate and regular rhythm. S1 and S2 normal without gallop, murmur, click, rub or other extra sounds. The AV fistula sounds are auscultated in the right anterior chest.  Lungs:Chest clear to auscultation; no wheezes, rhonchi,rales ,or rubs present.No increased work of breathing.    Extremities:  No cyanosis, edema, or clubbing  noted    Skin: Warm & dry w/o jaundice or tenting.         Assessment & Plan:  #1 viral illness, acute. Clinically this is improving although she does have low-grade fever.  #2 history of supraventricular tachycardia for which she is on a beta blocker as well as 2 calcium channel blockers. I have recommended that this regimen be reevaluated by Dr Lovena Le, her cardiologist.  Plan: See orders

## 2013-07-23 NOTE — Patient Instructions (Signed)
Zicam Melts or Zinc lozenges ; vitamin C 2000 mg daily; & Echinacea for 4-7 days. Report progressive fever, exudate("pus") or progressive pain.Plain Mucinex (NOT D) for thick secretions ;force NON dairy fluids .   Tylenol as needed for fever  based on label recommendations Nasal cleansing in the shower as discussed with lather of mild shampoo.After 10 seconds wash off lather while  exhaling through nostrils. Make sure that all residual soap is removed to prevent irritation.  Nasacort AQ OTC  1 spray in each nostril twice a day as needed. Use the "crossover" technique into opposite nostril spraying toward opposite ear @ 45 degree angle, not straight up into nostril.  Use a Neti pot daily only  as needed for significant sinus congestion; going from open side to congested side . Plain Allegra (NOT D )  160 daily , Loratidine 10 mg , OR Zyrtec 10 mg @ bedtime  as needed for itchy eyes & sneezing.

## 2013-07-23 NOTE — Progress Notes (Signed)
Pre visit review using our clinic review tool, if applicable. No additional management support is needed unless otherwise documented below in the visit note. 

## 2013-07-24 LAB — CBC WITH DIFFERENTIAL/PLATELET
Basophils Absolute: 0 10*3/uL (ref 0.0–0.1)
Eosinophils Absolute: 0.1 10*3/uL (ref 0.0–0.7)
Eosinophils Relative: 0.7 % (ref 0.0–5.0)
HCT: 32.7 % — ABNORMAL LOW (ref 36.0–46.0)
Lymphs Abs: 1.9 10*3/uL (ref 0.7–4.0)
MCHC: 33.9 g/dL (ref 30.0–36.0)
MCV: 96.2 fl (ref 78.0–100.0)
Monocytes Absolute: 0.9 10*3/uL (ref 0.1–1.0)
Platelets: 235 10*3/uL (ref 150.0–400.0)
RDW: 12.9 % (ref 11.5–14.6)

## 2013-07-31 ENCOUNTER — Ambulatory Visit: Payer: Medicare Other | Admitting: Internal Medicine

## 2013-08-08 HISTORY — PX: VULVA / PERINEUM BIOPSY: SUR155

## 2013-08-20 ENCOUNTER — Telehealth: Payer: Self-pay | Admitting: Internal Medicine

## 2013-08-20 ENCOUNTER — Encounter: Payer: Self-pay | Admitting: Internal Medicine

## 2013-08-20 ENCOUNTER — Ambulatory Visit (INDEPENDENT_AMBULATORY_CARE_PROVIDER_SITE_OTHER): Payer: Medicare Other | Admitting: Internal Medicine

## 2013-08-20 VITALS — BP 130/75 | HR 57 | Ht 66.0 in | Wt 205.0 lb

## 2013-08-20 DIAGNOSIS — I471 Supraventricular tachycardia: Secondary | ICD-10-CM

## 2013-08-20 DIAGNOSIS — I498 Other specified cardiac arrhythmias: Secondary | ICD-10-CM

## 2013-08-20 DIAGNOSIS — I359 Nonrheumatic aortic valve disorder, unspecified: Secondary | ICD-10-CM

## 2013-08-20 DIAGNOSIS — I351 Nonrheumatic aortic (valve) insufficiency: Secondary | ICD-10-CM | POA: Insufficient documentation

## 2013-08-20 DIAGNOSIS — N184 Chronic kidney disease, stage 4 (severe): Secondary | ICD-10-CM

## 2013-08-20 NOTE — Assessment & Plan Note (Signed)
The patient's diastolic blowing murmur suggests aortic insufficiency. I have reviewed the echo from 8 months ago which was a technically difficult study. There was not comment on aortic insufficiency. She will need a TEE to evaluate this further.

## 2013-08-20 NOTE — Addendum Note (Signed)
Addended by: Janan Halter F on: 08/20/2013 04:20 PM   Modules accepted: Orders

## 2013-08-20 NOTE — Assessment & Plan Note (Signed)
The patient is on HD and is currently undergoing transplant evaluation. Will follow and I will attempt to contact her transplant coordinator about exam findings and plan.

## 2013-08-20 NOTE — Telephone Encounter (Signed)
Scheduled patient for TEE on 08/27/13  She is to arrive at the [facility] at [place] on the morning of procedure and NPO after midnight

## 2013-08-20 NOTE — Assessment & Plan Note (Signed)
Her SVT appears to be well controlled on low dose verapamil and propranolol. Will follow.

## 2013-08-20 NOTE — Telephone Encounter (Signed)
New message     They got msg that pt is having new cardiac issues---will cancel echo/stress test planned for jan 20th in [place].  Pls fax updated records when problems are resolved to [phone] attn janet

## 2013-08-20 NOTE — Patient Instructions (Signed)
Will call you to schedule a TEE after Dr Lovena Le speaks with transplant coordinator

## 2013-08-20 NOTE — Addendum Note (Signed)
Addended by: Janan Halter F on: 08/20/2013 02:21 PM   Modules accepted: Orders

## 2013-08-20 NOTE — Progress Notes (Signed)
HPI Tabitha Green returns today for followup. She is a pleasant middle aged woman, with a h/o SVT on multiple medications. The patient has ESRD on HD. She is considering renal transplant and is undergoing evaluation. She denies chest pain, sob, or syncope. No edema. She has done well with a combination of calcium channel blocker and beta blockers with no sustained SVT. She is currently taking verapamil 120 mg daily and propranolol 40 mg daily. She is undergoing evaluation for kidney transplant.  Allergies  Allergen Reactions  . Cefaclor Rash  . Naproxen Rash  . Enalapril Maleate Cough  . Metoprolol Tartrate Rash    On legs     Current Outpatient Prescriptions  Medication Sig Dispense Refill  . acetaminophen (TYLENOL) 500 MG tablet Take 500-750 mg by mouth every 6 (six) hours as needed for pain.      [name] aspirin EC 81 MG EC tablet Take 1 tablet (81 mg total) by mouth daily.      Skipper Cliche SALINE NASAL DROPS NA Place 2 sprays into both nostrils daily.      [name] CALCIUM CARBONATE PO Take 2 tablets by mouth 3 (three) times daily.      . Camphor-Eucalyptus-Menthol (VICKS VAPORUB EX) Apply 1 application topically daily.      . fluticasone (FLONASE) 50 MCG/ACT nasal spray Place 1 spray into the nose 2 (two) times daily as needed for rhinitis.  16 g  2  . levothyroxine (SYNTHROID, LEVOTHROID) 175 MCG tablet Take 1 tablet daily except Tuesday and Thursday take 1.5 tablets      . loratadine (CLARITIN) 10 MG tablet Take 20 mg by mouth as needed for allergies.      . multivitamin (RENA-VIT) TABS tablet Take 1 tablet by mouth daily.  30 tablet  1  . propranolol (INDERAL) 40 MG tablet Take 1 tablet once daily on Monday, Wednesday, and Friday (dialysis day)  and take 1 tablet  twice a day on Sunday, Tuesday, Thursday, and Saturday      . verapamil (CALAN-SR) 120 MG CR tablet Take 1 tablet (120 mg total) by mouth daily.  90 tablet  1   No current facility-administered medications for this visit.     Past  Medical History  Diagnosis Date  . Hypertension   . Hypothyroidism   . Gout   . Arthritis   . Anemia     r/t kidney function- receives Procrit  . Cancer     kidney  . FSGS (focal segmental glomerulosclerosis)     right kidney;  . Osteopenia   . Diverticulosis     a. 05/2012 colonoscopy  . CKD (chronic kidney disease)     followed by Dr. [name].  . SVT (supraventricular tachycardia)     ROS:   All systems reviewed and negative except as noted in the HPI.   Past Surgical History  Procedure Laterality Date  . Total abdominal hysterectomy w/ bilateral salpingoophorectomy      fibroids  . Nephrectomy  1993    for malignancy- left  . Colonoscopy with polypectomy      Dr Henrene Pastor  . Av fistula placement      pt has not had to start dialysis  . Renal biopsy      right  . Colonoscopy       Family History  Problem Relation Age of Onset  . Deep vein thrombosis Mother     post thyroid surgery  . Hypertension Mother   . Heart attack  Father 81    deceased  . Hypertension Father   . Cancer Sister     breast  . Cancer Paternal Aunt     pancreatic  . Diabetes Maternal Grandfather   . Stroke Paternal Grandmother     in 68s  . Colon cancer Neg Hx   . Esophageal cancer Neg Hx   . Stomach cancer Neg Hx   . Rectal cancer Neg Hx      History   Social History  . Marital Status: Married    Spouse Name: N/A    Number of Children: N/A  . Years of Education: N/A   Occupational History  . Not on file.   Social History Main Topics  . Smoking status: Never Smoker   . Smokeless tobacco: Never Used  . Alcohol Use: Yes     Comment: rarely  . Drug Use: No  . Sexual Activity: Not Currently   Other Topics Concern  . Not on file   Social History Narrative   Lives in [place] with husband.  Retired.  Previously worked in [employer] @ [employer]     BP 130/75  Pulse 57  Ht 5\' 6"  (1.676 m)  Wt 205 lb (92.987 kg)  BMI 33.10 kg/m2  Physical Exam:  Stable appearing  middle aged woman, obese, NAD HEENT: Unremarkable Neck:  7 cm JVD, no thyromegally Back:  No CVA tenderness Lungs:  Clear with no wheezes, rales, or rhonchi HEART:  Regular rate rhythm, 3/6 murmur of aortic insufficiency, no rubs, no clicks Abd:  soft, positive bowel sounds, no organomegally, no rebound, no guarding Ext:  2 plus pulses, no edema, no cyanosis, no clubbing Skin:  No rashes no nodules Neuro:  CN II through XII intact, motor grossly intact  EKG - nsr with first degree AV block   Assess/Plan:

## 2013-08-26 ENCOUNTER — Telehealth: Payer: Self-pay | Admitting: *Deleted

## 2013-08-26 NOTE — Telephone Encounter (Signed)
Pt LM on [place], [place] voicemail at hospital to call her regarding procedure tomorrow.  States she has questions.  LM on her machine to call us back.

## 2013-08-26 NOTE — Telephone Encounter (Signed)
Follow up    Pt is returning nurses call. She is at dialysis now.  pls leave on ans machine what she needs to know for tomorrow.

## 2013-08-26 NOTE — Telephone Encounter (Signed)
Her message to [place] was to leave instructions on her voicemail.  Advised that she is scheduled for TEE tomorrow 1/20. Needs  to be at [facility] main entrance [address] at 8:30 AM. Nothing to eat or drink after midnight.  May take medications with a sip of water.  Also someone needs to come with her to drive since she will be sedated.  If has any further questions to call us back.

## 2013-08-27 ENCOUNTER — Encounter (HOSPITAL_COMMUNITY)
Admission: RE | Disposition: A | Discharge status: Home / Self Care | Payer: Self-pay | Source: Ambulatory Visit | Attending: Internal Medicine

## 2013-08-27 ENCOUNTER — Encounter (HOSPITAL_COMMUNITY): Payer: Self-pay | Admitting: *Deleted

## 2013-08-27 ENCOUNTER — Ambulatory Visit (HOSPITAL_COMMUNITY)
Admission: RE | Admit: 2013-08-27 | Discharge: 2013-08-27 | Disposition: A | Discharge status: Home / Self Care | Payer: Medicare Other | Source: Ambulatory Visit | Attending: Internal Medicine | Admitting: Internal Medicine

## 2013-08-27 DIAGNOSIS — R011 Cardiac murmur, unspecified: Secondary | ICD-10-CM | POA: Insufficient documentation

## 2013-08-27 DIAGNOSIS — I120 Hypertensive chronic kidney disease with stage 5 chronic kidney disease or end stage renal disease: Secondary | ICD-10-CM | POA: Insufficient documentation

## 2013-08-27 DIAGNOSIS — D649 Anemia, unspecified: Secondary | ICD-10-CM | POA: Insufficient documentation

## 2013-08-27 DIAGNOSIS — I059 Rheumatic mitral valve disease, unspecified: Secondary | ICD-10-CM

## 2013-08-27 DIAGNOSIS — M949 Disorder of cartilage, unspecified: Secondary | ICD-10-CM

## 2013-08-27 DIAGNOSIS — I351 Nonrheumatic aortic (valve) insufficiency: Secondary | ICD-10-CM

## 2013-08-27 DIAGNOSIS — E039 Hypothyroidism, unspecified: Secondary | ICD-10-CM | POA: Insufficient documentation

## 2013-08-27 DIAGNOSIS — Z992 Dependence on renal dialysis: Secondary | ICD-10-CM | POA: Insufficient documentation

## 2013-08-27 DIAGNOSIS — M109 Gout, unspecified: Secondary | ICD-10-CM | POA: Insufficient documentation

## 2013-08-27 DIAGNOSIS — N186 End stage renal disease: Secondary | ICD-10-CM | POA: Insufficient documentation

## 2013-08-27 DIAGNOSIS — I498 Other specified cardiac arrhythmias: Secondary | ICD-10-CM | POA: Insufficient documentation

## 2013-08-27 DIAGNOSIS — Z7982 Long term (current) use of aspirin: Secondary | ICD-10-CM | POA: Insufficient documentation

## 2013-08-27 DIAGNOSIS — M899 Disorder of bone, unspecified: Secondary | ICD-10-CM | POA: Insufficient documentation

## 2013-08-27 HISTORY — PX: TEE WITHOUT CARDIOVERSION: SHX5443

## 2013-08-27 SURGERY — ECHOCARDIOGRAM, TRANSESOPHAGEAL
Anesthesia: Moderate Sedation

## 2013-08-27 MED ORDER — HYDRALAZINE HCL 20 MG/ML IJ SOLN
10.0000 mg | Freq: Once | INTRAMUSCULAR | Status: AC
  Administered 2013-08-27: 10 mg via INTRAVENOUS

## 2013-08-27 MED ORDER — SODIUM CHLORIDE 0.9 % IV SOLN
INTRAVENOUS | Status: DC
  Administered 2013-08-27: 500 mL via INTRAVENOUS

## 2013-08-27 MED ORDER — FENTANYL CITRATE 0.05 MG/ML IJ SOLN
INTRAMUSCULAR | Status: DC | PRN
  Administered 2013-08-27 (×2): 25 ug via INTRAVENOUS

## 2013-08-27 MED ORDER — MIDAZOLAM HCL 5 MG/ML IJ SOLN
INTRAMUSCULAR | Status: AC
  Filled 2013-08-27: qty 2

## 2013-08-27 MED ORDER — BUTAMBEN-TETRACAINE-BENZOCAINE 2-2-14 % EX AERO
INHALATION_SPRAY | CUTANEOUS | Status: DC | PRN
  Administered 2013-08-27: 2 via TOPICAL

## 2013-08-27 MED ORDER — HYDRALAZINE HCL 20 MG/ML IJ SOLN
INTRAMUSCULAR | Status: AC
  Filled 2013-08-27: qty 1

## 2013-08-27 MED ORDER — VERAPAMIL HCL ER 120 MG PO TBCR
120.0000 mg | EXTENDED_RELEASE_TABLET | Freq: Once | ORAL | Status: AC
  Administered 2013-08-27: 120 mg via ORAL
  Filled 2013-08-27: qty 1

## 2013-08-27 MED ORDER — MIDAZOLAM HCL 10 MG/2ML IJ SOLN
INTRAMUSCULAR | Status: DC | PRN
  Administered 2013-08-27: 1 mg via INTRAVENOUS
  Administered 2013-08-27 (×2): 2 mg via INTRAVENOUS

## 2013-08-27 MED ORDER — FENTANYL CITRATE 0.05 MG/ML IJ SOLN
INTRAMUSCULAR | Status: AC
  Filled 2013-08-27: qty 2

## 2013-08-27 NOTE — H&P (View-Only) (Signed)
HPI Tabitha Green returns today for followup. She is a pleasant middle aged woman, with a h/o SVT on multiple medications. The patient has ESRD on HD. She is considering renal transplant and is undergoing evaluation. She denies chest pain, sob, or syncope. No edema. She has done well with a combination of calcium channel blocker and beta blockers with no sustained SVT. She is currently taking verapamil 120 mg daily and propranolol 40 mg daily. She is undergoing evaluation for kidney transplant.  Allergies  Allergen Reactions  . Cefaclor Rash  . Naproxen Rash  . Enalapril Maleate Cough  . Metoprolol Tartrate Rash    On legs     Current Outpatient Prescriptions  Medication Sig Dispense Refill  . acetaminophen (TYLENOL) 500 MG tablet Take 500-750 mg by mouth every 6 (six) hours as needed for pain.      [name] aspirin EC 81 MG EC tablet Take 1 tablet (81 mg total) by mouth daily.      Skipper Cliche SALINE NASAL DROPS NA Place 2 sprays into both nostrils daily.      [name] CALCIUM CARBONATE PO Take 2 tablets by mouth 3 (three) times daily.      . Camphor-Eucalyptus-Menthol (VICKS VAPORUB EX) Apply 1 application topically daily.      . fluticasone (FLONASE) 50 MCG/ACT nasal spray Place 1 spray into the nose 2 (two) times daily as needed for rhinitis.  16 g  2  . levothyroxine (SYNTHROID, LEVOTHROID) 175 MCG tablet Take 1 tablet daily except Tuesday and Thursday take 1.5 tablets      . loratadine (CLARITIN) 10 MG tablet Take 20 mg by mouth as needed for allergies.      . multivitamin (RENA-VIT) TABS tablet Take 1 tablet by mouth daily.  30 tablet  1  . propranolol (INDERAL) 40 MG tablet Take 1 tablet once daily on Monday, Wednesday, and Friday (dialysis day)  and take 1 tablet  twice a day on Sunday, Tuesday, Thursday, and Saturday      . verapamil (CALAN-SR) 120 MG CR tablet Take 1 tablet (120 mg total) by mouth daily.  90 tablet  1   No current facility-administered medications for this visit.     Past  Medical History  Diagnosis Date  . Hypertension   . Hypothyroidism   . Gout   . Arthritis   . Anemia     r/t kidney function- receives Procrit  . Cancer     kidney  . FSGS (focal segmental glomerulosclerosis)     right kidney;  . Osteopenia   . Diverticulosis     a. 05/2012 colonoscopy  . CKD (chronic kidney disease)     followed by Dr. [name].  . SVT (supraventricular tachycardia)     ROS:   All systems reviewed and negative except as noted in the HPI.   Past Surgical History  Procedure Laterality Date  . Total abdominal hysterectomy w/ bilateral salpingoophorectomy      fibroids  . Nephrectomy  1993    for malignancy- left  . Colonoscopy with polypectomy      Dr Henrene Pastor  . Av fistula placement      pt has not had to start dialysis  . Renal biopsy      right  . Colonoscopy       Family History  Problem Relation Age of Onset  . Deep vein thrombosis Mother     post thyroid surgery  . Hypertension Mother   . Heart attack  Father 61    deceased  . Hypertension Father   . Cancer Sister     breast  . Cancer Paternal Aunt     pancreatic  . Diabetes Maternal Grandfather   . Stroke Paternal Grandmother     in 50s  . Colon cancer Neg Hx   . Esophageal cancer Neg Hx   . Stomach cancer Neg Hx   . Rectal cancer Neg Hx      History   Social History  . Marital Status: Married    Spouse Name: N/A    Number of Children: N/A  . Years of Education: N/A   Occupational History  . Not on file.   Social History Main Topics  . Smoking status: Never Smoker   . Smokeless tobacco: Never Used  . Alcohol Use: Yes     Comment: rarely  . Drug Use: No  . Sexual Activity: Not Currently   Other Topics Concern  . Not on file   Social History Narrative   Lives in [place] with husband.  Retired.  Previously worked in [employer] @ [employer]     BP 130/75  Pulse 57  Ht 5\' 6"  (1.676 m)  Wt 205 lb (92.987 kg)  BMI 33.10 kg/m2  Physical Exam:  Stable appearing  middle aged woman, obese, NAD HEENT: Unremarkable Neck:  7 cm JVD, no thyromegally Back:  No CVA tenderness Lungs:  Clear with no wheezes, rales, or rhonchi HEART:  Regular rate rhythm, 3/6 murmur of aortic insufficiency, no rubs, no clicks Abd:  soft, positive bowel sounds, no organomegally, no rebound, no guarding Ext:  2 plus pulses, no edema, no cyanosis, no clubbing Skin:  No rashes no nodules Neuro:  CN II through XII intact, motor grossly intact  EKG - nsr with first degree AV block   Assess/Plan:

## 2013-08-27 NOTE — Interval H&P Note (Signed)
History and Physical Interval Note:  08/27/2013 8:56 AM  [name]  has presented today for surgery, with the diagnosis of AORTIC INSUFICIENCY  The various methods of treatment have been discussed with the patient and family. After consideration of risks, benefits and other options for treatment, the patient has consented to  Procedure(s): TRANSESOPHAGEAL ECHOCARDIOGRAM (TEE) (N/A) as a surgical intervention .  The patient's history has been reviewed, patient examined, no change in status, stable for surgery.  I have reviewed the patient's chart and labs.  Questions were answered to the patient's satisfaction.     Jenkins Rouge

## 2013-08-27 NOTE — CV Procedure (Signed)
TEE  4 mg versed and 50 ug fentanyl  Normal EF 60% Trivial AR Trivial MR Mild LAE Normal RV High flow out pulmonary artery likely from fistula Trivial PR Normal PV;s  No ASD/PFO No effusion Grade 3 mural aortic debris  Suspect diastolic murmur related to large fistula in LUE for dialysis  HTN  Given hydralazine  Jenkins Rouge

## 2013-08-27 NOTE — Progress Notes (Signed)
Echocardiogram Echocardiogram Transesophageal has been performed.  Burleigh Brockmann 08/27/2013, 11:12 AM

## 2013-08-27 NOTE — Discharge Instructions (Signed)
Transesophageal Echocardiography °Transesophageal echocardiography (TEE) is a picture test of your heart using sound waves. The pictures taken can give very detailed pictures of your heart. This can help your doctor see if there are problems with your heart. TEE can check: °· If your heart has blood clots in it. °· How well your heart valves are working. °· If you have an infection on the inside of your heart. °· Some of the major arteries of your heart. °· If your heart valve is working after a repair. °· Your heart before a procedure that uses a shock to your heart to get the rhythm back to normal. °BEFORE THE PROCEDURE °· Do not eat or drink for 6 hours before the procedure or as told by your doctor. °· Make plans to have someone drive you home after the procedure. Do not drive yourself home. °· An IV tube will be put in your arm. °PROCEDURE °· You will be given a medicine to help you relax (sedative). It will be given through the IV tube. °· A numbing medicine will be sprayed in the back of your throat to help numb it. °· The tip of the probe is placed into the back of your mouth. You will be asked to swallow. This helps to pass the probe into your esophagus. °· Once the tip of the probe is in the right place, your doctor can take pictures of your heart. °· You may feel pressure at the back of your throat. °AFTER THE PROCEDURE °· You will be taken to a recovery area so the sedative can wear off. °· Your throat may be sore and scratchy. This will go away slowly over time. °· You will go home when you are fully awake and able to swallow liquids. °· You should have someone stay with you for the next 24 hours. °Document Released: 05/22/2009 Document Revised: 05/15/2013 Document Reviewed: 01/24/2013 °[employer]® Patient Information ©2014 [employer]. ° °

## 2013-08-28 ENCOUNTER — Encounter (HOSPITAL_COMMUNITY): Payer: Self-pay | Admitting: Cardiovascular Disease

## 2013-08-29 ENCOUNTER — Ambulatory Visit: Payer: Medicare Other | Admitting: Family Medicine

## 2013-10-05 ENCOUNTER — Emergency Department
Admission: EM | Admit: 2013-10-05 | Discharge: 2013-10-05 | Disposition: A | Discharge status: Home / Self Care | Payer: Medicare Other | Source: Home / Self Care | Attending: Family Medicine | Admitting: Family Medicine

## 2013-10-05 ENCOUNTER — Emergency Department (INDEPENDENT_AMBULATORY_CARE_PROVIDER_SITE_OTHER): Payer: Medicare Other

## 2013-10-05 ENCOUNTER — Encounter: Payer: Self-pay | Admitting: Emergency Medicine

## 2013-10-05 DIAGNOSIS — M7918 Myalgia, other site: Secondary | ICD-10-CM

## 2013-10-05 DIAGNOSIS — M47812 Spondylosis without myelopathy or radiculopathy, cervical region: Secondary | ICD-10-CM

## 2013-10-05 DIAGNOSIS — IMO0001 Reserved for inherently not codable concepts without codable children: Secondary | ICD-10-CM

## 2013-10-05 DIAGNOSIS — M5030 Other cervical disc degeneration, unspecified cervical region: Secondary | ICD-10-CM

## 2013-10-05 DIAGNOSIS — R079 Chest pain, unspecified: Secondary | ICD-10-CM

## 2013-10-05 DIAGNOSIS — M549 Dorsalgia, unspecified: Secondary | ICD-10-CM

## 2013-10-05 DIAGNOSIS — M940 Chondrocostal junction syndrome [Tietze]: Secondary | ICD-10-CM

## 2013-10-05 DIAGNOSIS — M542 Cervicalgia: Secondary | ICD-10-CM

## 2013-10-05 IMAGING — CR DG CERVICAL SPINE COMPLETE 4+V
7 series · 7 of 7 positions shown · non-contrast
Comparison: None.

CLINICAL DATA: Recurring posterior right neck pain. History of
previous neck injury.

EXAM:
CERVICAL SPINE  4+ VIEWS

[view not recorded (1 of 7)]
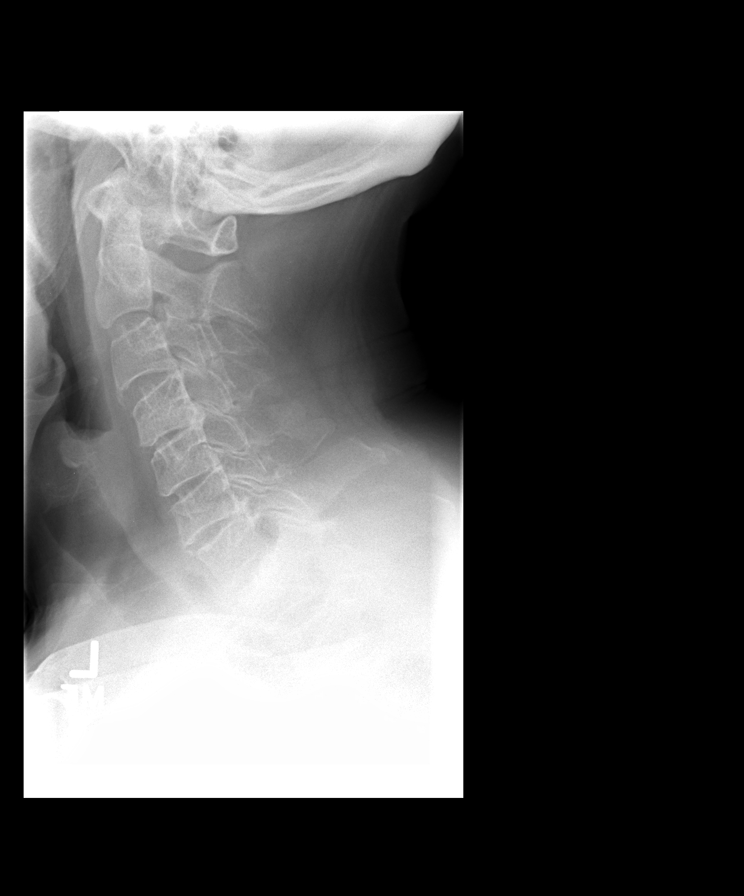

[view not recorded (2 of 7)]
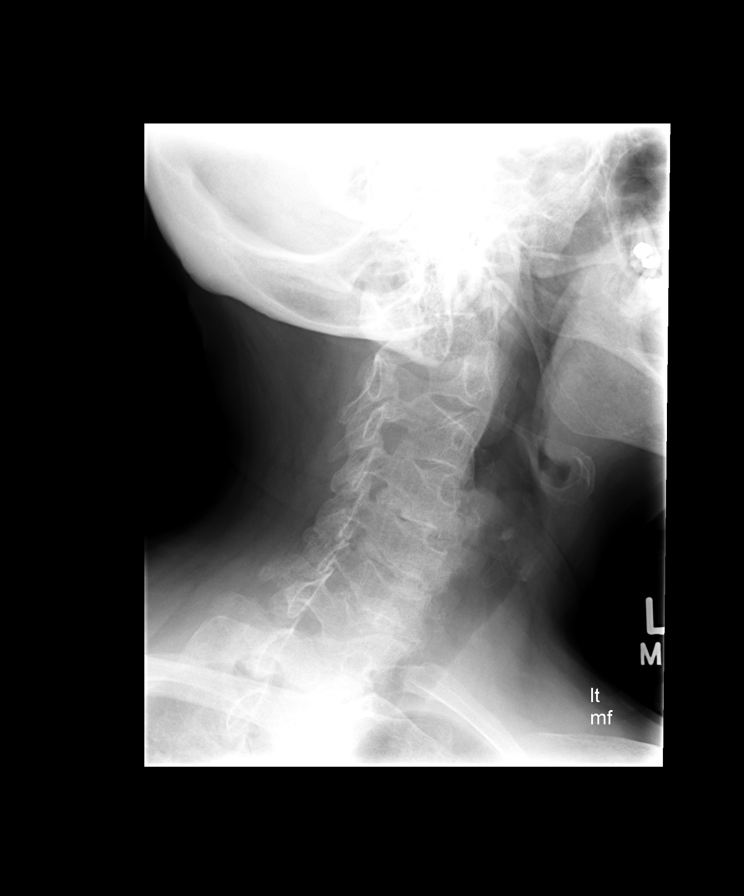

[view not recorded (3 of 7)]
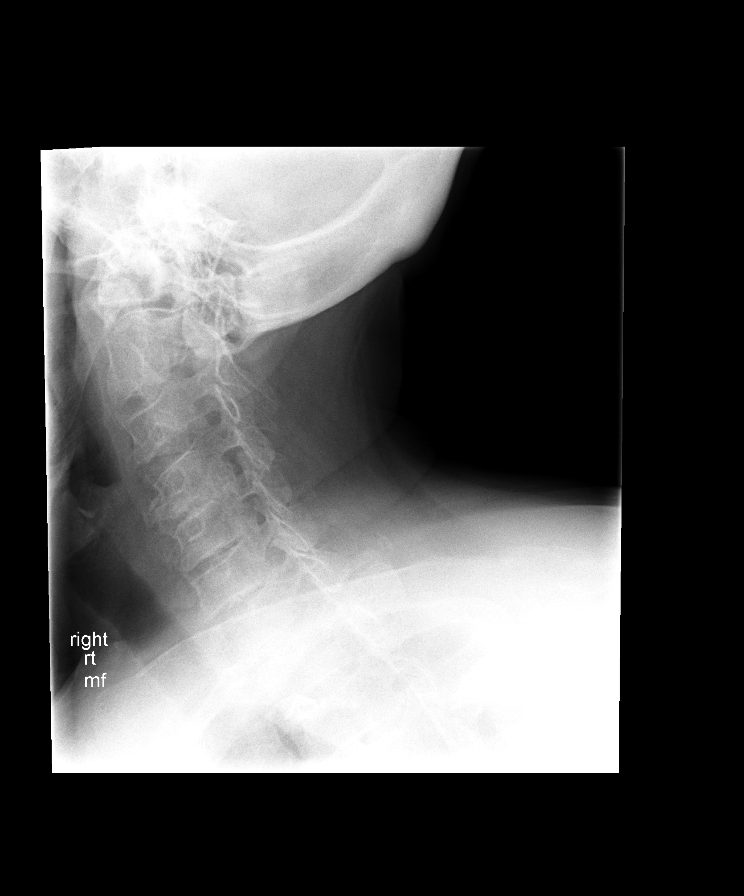

[view not recorded (4 of 7)]
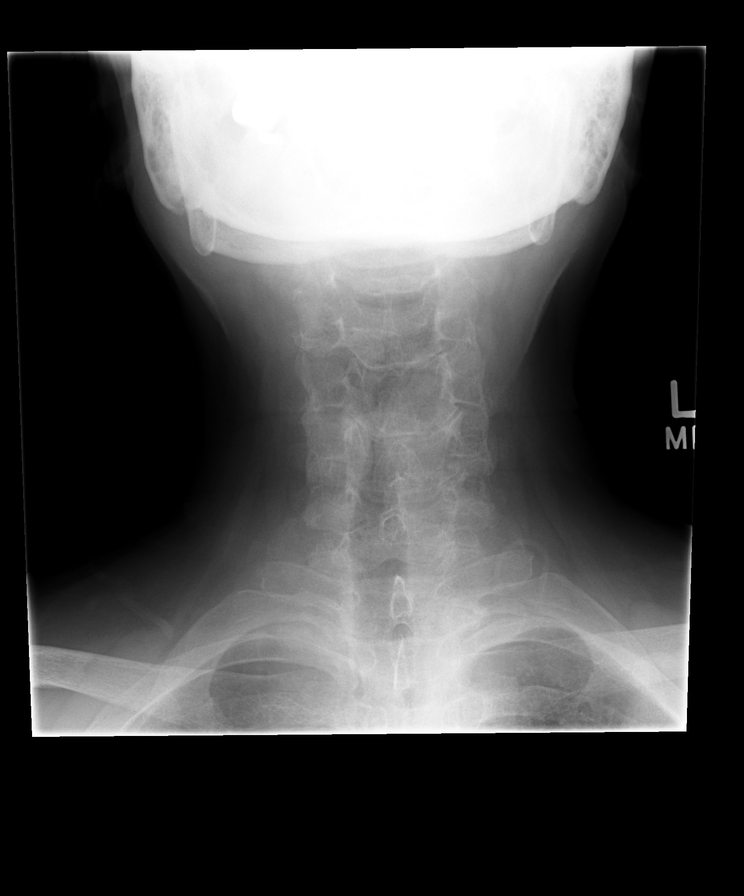

[view not recorded (5 of 7)]
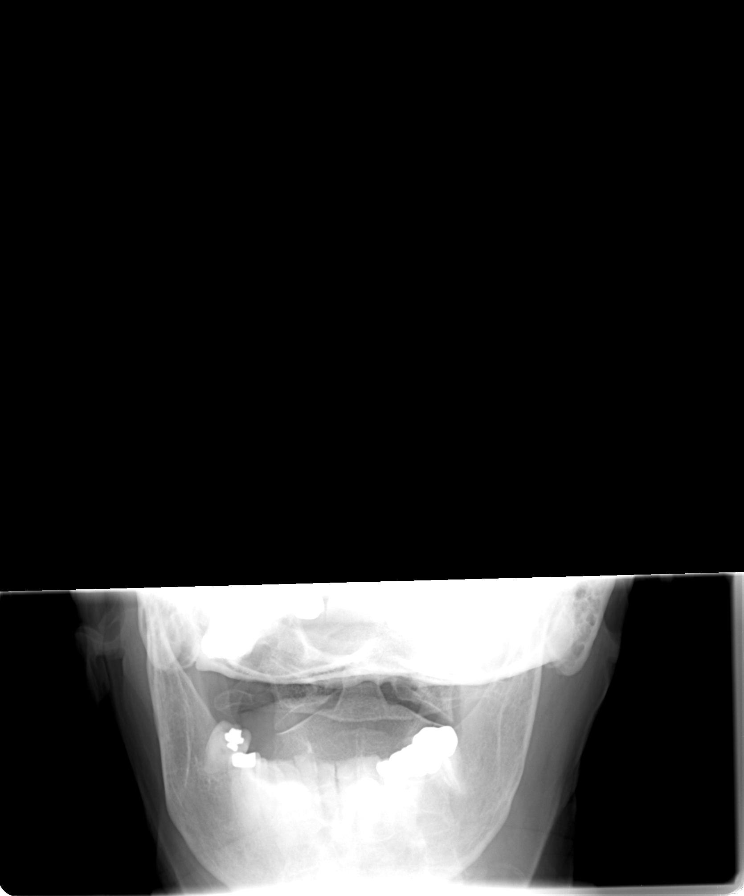

[view not recorded (6 of 7)]
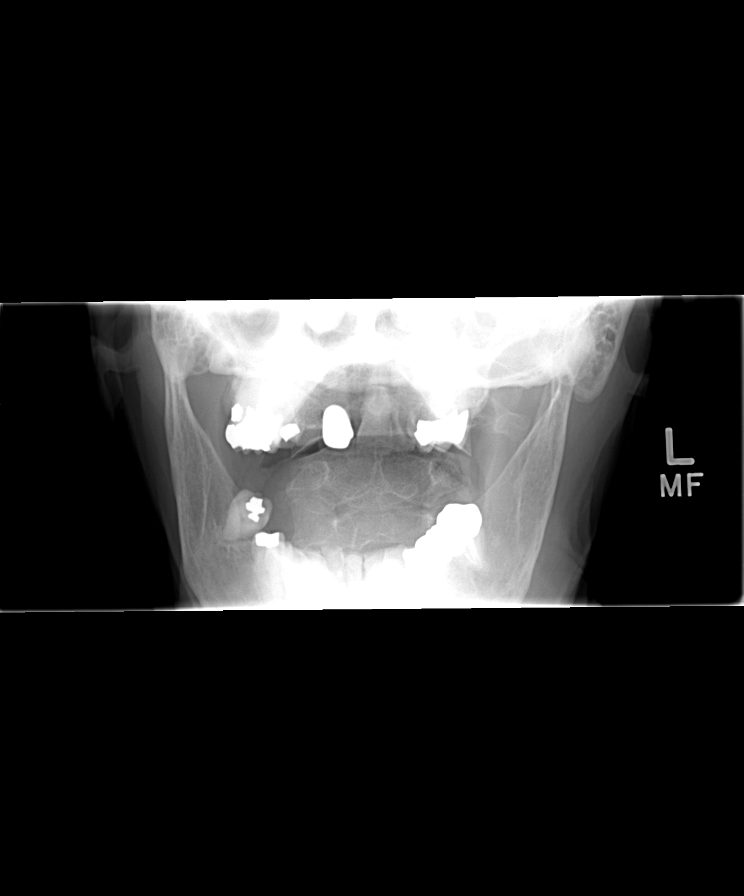

[view not recorded (7 of 7)]
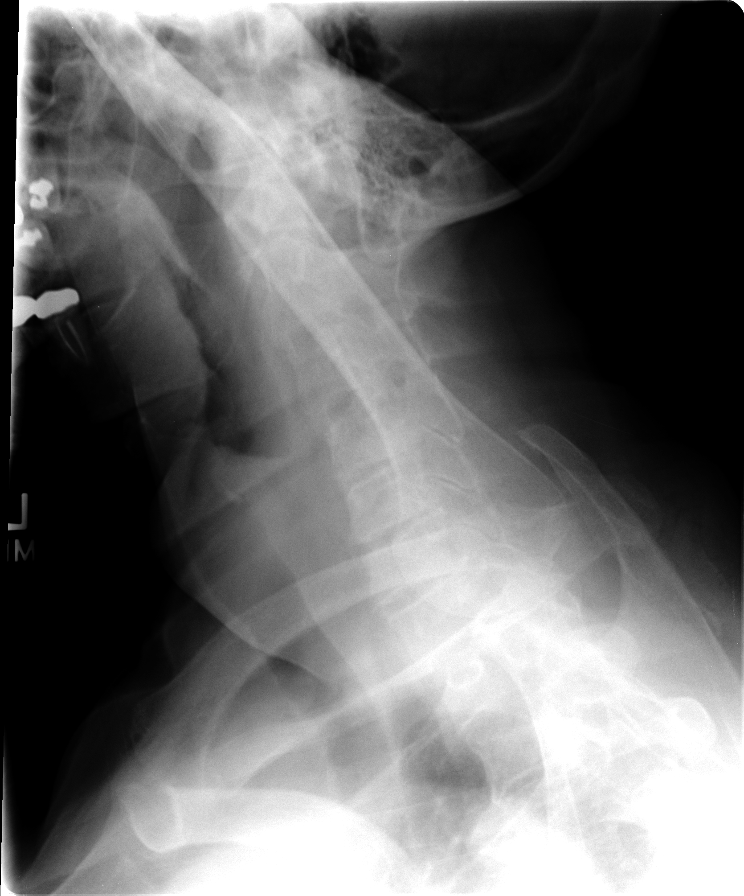

[7 of 7 positions shown; findings below may reference images not displayed]

FINDINGS: Vertebral body alignment and heights are normal. There is mild
spondylosis throughout the cervical spine. There is mild disc space
narrowing at the C4-5, C5-6 and C6-7 levels. Prevertebral soft
tissues are within normal. There is no compression fracture or
subluxation. There is moderate bilateral neural foraminal narrowing
at the C4-5 and C5-6 levels. There is uncovertebral joint spurring.
The atlantoaxial articulation is well as prevertebral soft tissues
are within normal.
IMPRESSION: Mild spondylosis of the cervical spine with multilevel disc disease
from C4-5 to the C6-7 level. Bilateral neural foraminal narrowing at
the C4-5 and C5-6 levels.

## 2013-10-05 IMAGING — CR DG CHEST 2V
2 series · 2 of 2 positions shown · non-contrast
Comparison: [DATE]

CLINICAL DATA: Back pain for 3 weeks.  Mid chest pain.

EXAM:
CHEST  2 VIEW

[view not recorded (1 of 2)]
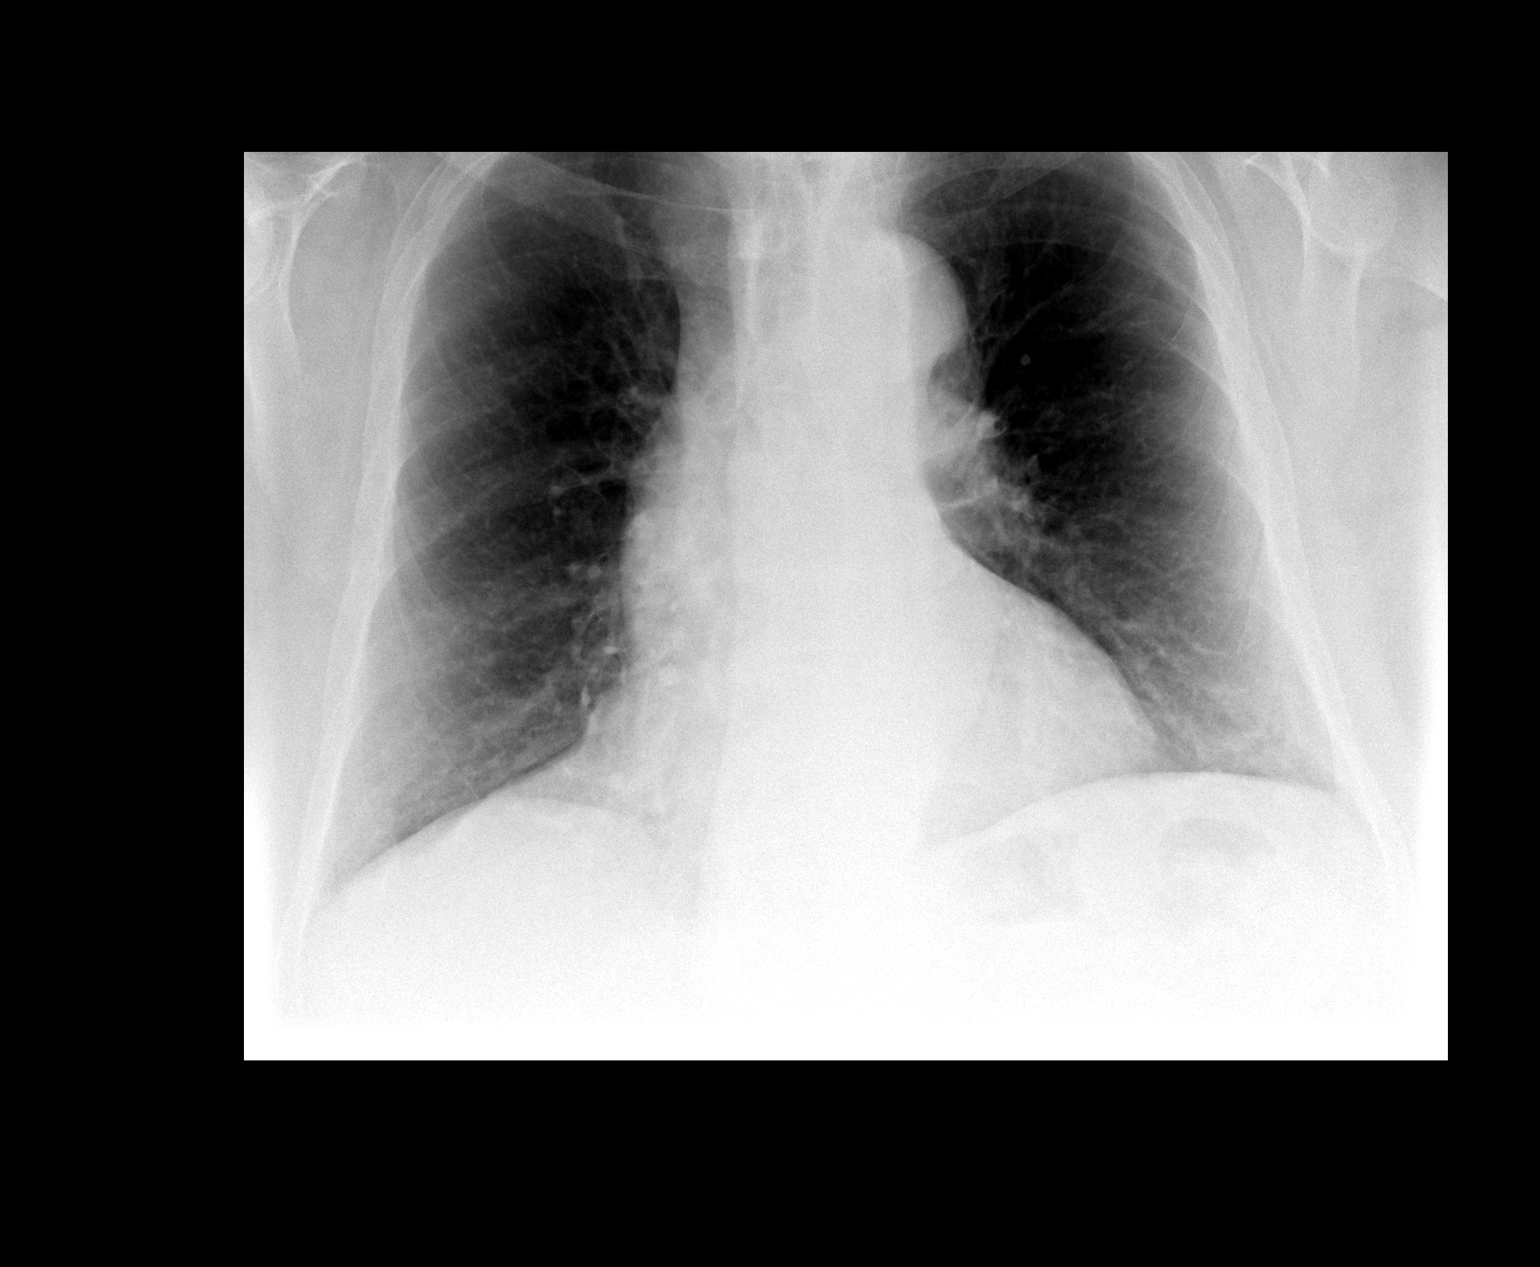

[view not recorded (2 of 2)]
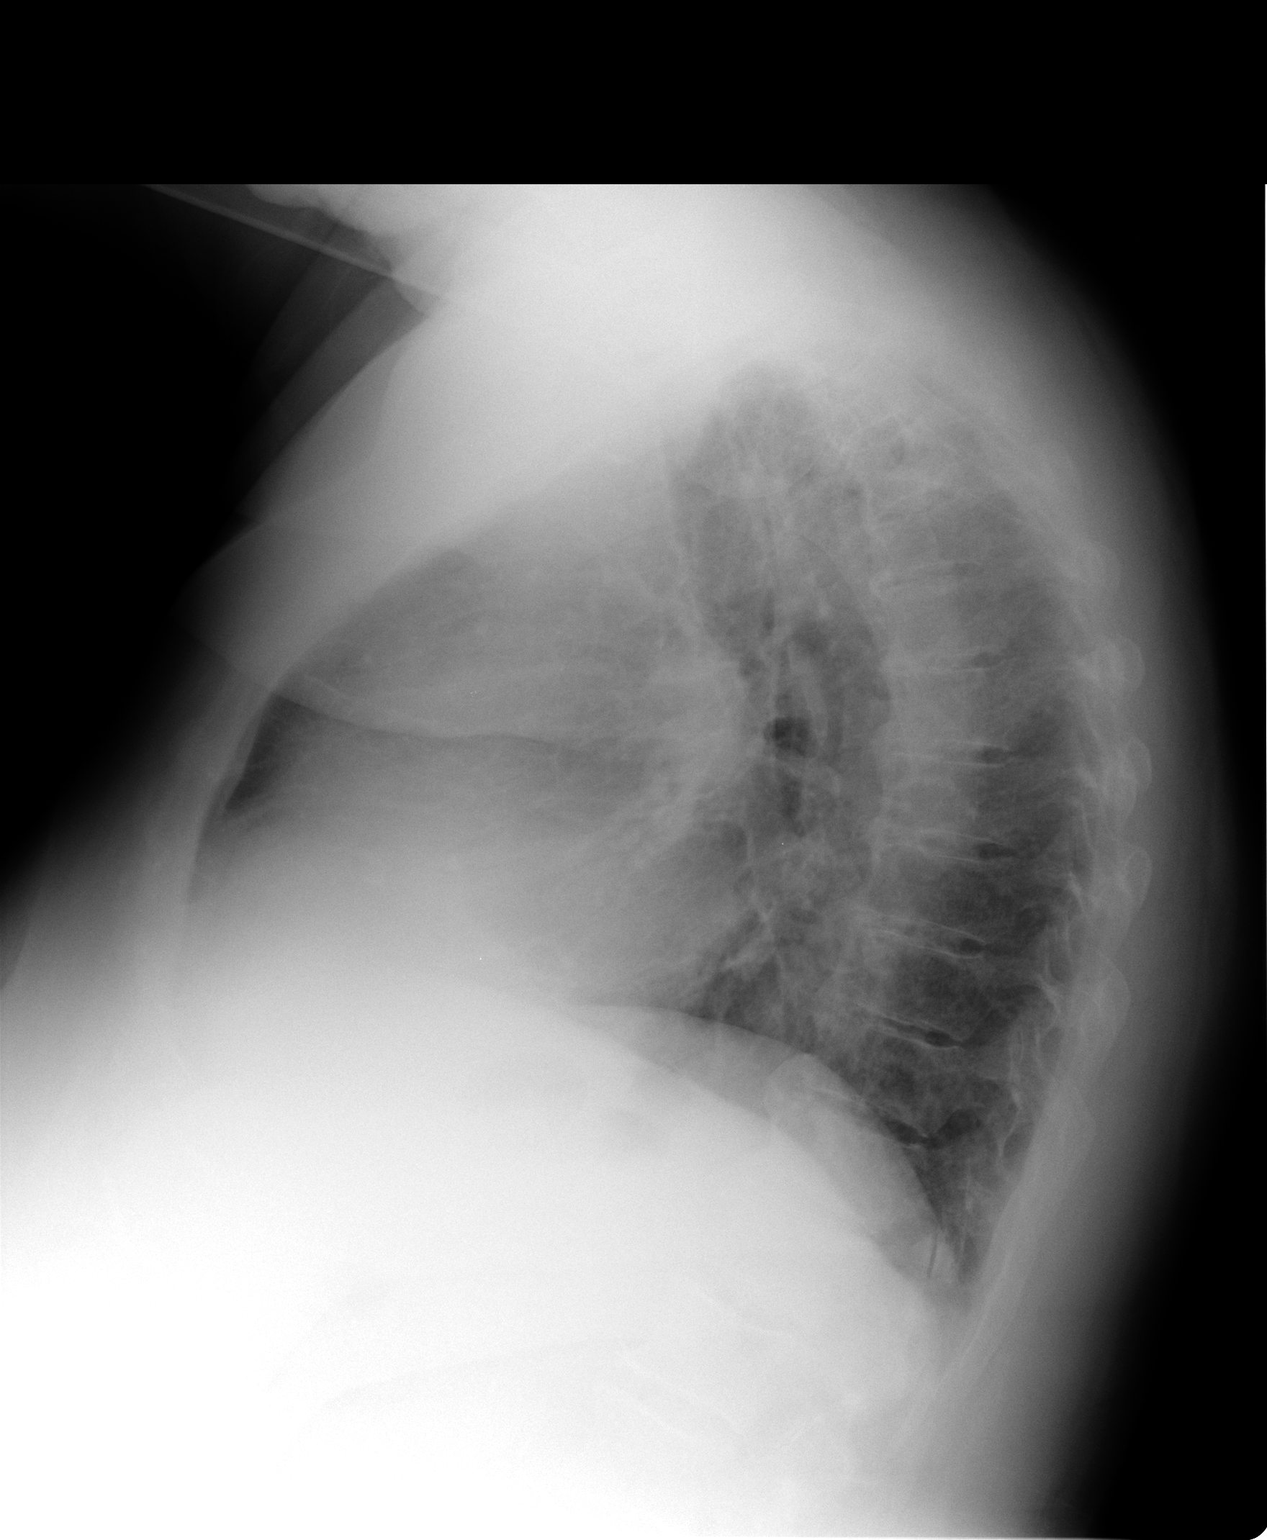

[2 of 2 positions shown; findings below may reference images not displayed]

FINDINGS: The lungs are adequately inflated without focal consolidation or
effusion. There is stable borderline cardiomegaly. Remainder of the
exam is unchanged.
IMPRESSION: No active cardiopulmonary disease.

## 2013-10-05 NOTE — ED Notes (Signed)
Patient c/o back and chest pain x 2 wks; also neck pain that radiated to right eye.
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
## 2013-10-05 NOTE — Discharge Instructions (Signed)
Apply heating pad to sternum two or three times daily

## 2013-10-08 ENCOUNTER — Telehealth: Payer: Self-pay | Admitting: *Deleted

## 2013-10-10 ENCOUNTER — Ambulatory Visit (INDEPENDENT_AMBULATORY_CARE_PROVIDER_SITE_OTHER): Payer: Medicare Other | Admitting: Sports Medicine

## 2013-10-10 ENCOUNTER — Encounter: Payer: Self-pay | Admitting: Sports Medicine

## 2013-10-10 VITALS — BP 105/70 | HR 72 | Ht 66.0 in | Wt 208.0 lb

## 2013-10-10 DIAGNOSIS — M47812 Spondylosis without myelopathy or radiculopathy, cervical region: Secondary | ICD-10-CM | POA: Insufficient documentation

## 2013-10-10 MED ORDER — PREDNISONE 50 MG PO TABS: ORAL_TABLET | ORAL | Status: DC

## 2013-10-10 NOTE — Progress Notes (Signed)
   Subjective:    I'm seeing this patient as a consultation for:  Dr. Assunta Found  CC: Neck pain  HPI: This is a very pleasant 68 year old female with a history of renal insufficiency currently on dialysis, she tells me that for several months she's had pain that she localizes on the right side of her neck without any radicular symptoms, pain is all axial, moderate, persistent. She was seen in urgent care but due to her renal failure no treatment was initiated.  Past medical history, Surgical history, Family history not pertinant except as noted below, Social history, Allergies, and medications have been entered into the medical record, reviewed, and no changes needed.   Review of Systems: No headache, visual changes, nausea, vomiting, diarrhea, constipation, dizziness, abdominal pain, skin rash, fevers, chills, night sweats, weight loss, swollen lymph nodes, body aches, joint swelling, muscle aches, chest pain, shortness of breath, mood changes, visual or auditory hallucinations.   Objective:   General: Well Developed, well nourished, and in no acute distress.  Neuro/Psych: Alert and oriented x3, extra-ocular muscles intact, able to move all 4 extremities, sensation grossly intact. Skin: Warm and dry, no rashes noted.  Respiratory: Not using accessory muscles, speaking in full sentences, trachea midline.  Cardiovascular: Pulses palpable, no extremity edema. Abdomen: Does not appear distended. Neck: Inspection unremarkable. No palpable stepoffs. Negative Spurling's maneuver. Full neck range of motion Grip strength and sensation normal in bilateral hands Strength good C4 to T1 distribution No sensory change to C4 to T1 Negative Hoffman sign bilaterally Reflexes normal  X-ray show widespread and moderate facet spondylosis as well as degenerative disc disease.  Impression and Recommendations:   This case required medical decision making of moderate complexity.

## 2013-10-10 NOTE — Assessment & Plan Note (Signed)
This is classic and without radicular symptoms and suggestive more of facet mediated pain. 5 days of prednisone, formal physical therapy, avoiding NSAIDs considering renal failure/dialysis. Return to see me in one month, consider MRI for facet injections if no better. Facet injections use only very little x-ray contrast and will be safe.

## 2013-10-17 ENCOUNTER — Ambulatory Visit (INDEPENDENT_AMBULATORY_CARE_PROVIDER_SITE_OTHER): Payer: Medicare Other

## 2013-10-17 DIAGNOSIS — M6281 Muscle weakness (generalized): Secondary | ICD-10-CM

## 2013-10-17 DIAGNOSIS — M47812 Spondylosis without myelopathy or radiculopathy, cervical region: Secondary | ICD-10-CM

## 2013-10-17 DIAGNOSIS — M542 Cervicalgia: Secondary | ICD-10-CM

## 2013-10-17 DIAGNOSIS — R293 Abnormal posture: Secondary | ICD-10-CM

## 2013-10-24 ENCOUNTER — Encounter (INDEPENDENT_AMBULATORY_CARE_PROVIDER_SITE_OTHER): Payer: Medicare Other

## 2013-10-24 DIAGNOSIS — R293 Abnormal posture: Secondary | ICD-10-CM

## 2013-10-24 DIAGNOSIS — M47812 Spondylosis without myelopathy or radiculopathy, cervical region: Secondary | ICD-10-CM

## 2013-10-24 DIAGNOSIS — M542 Cervicalgia: Secondary | ICD-10-CM

## 2013-10-24 DIAGNOSIS — M6281 Muscle weakness (generalized): Secondary | ICD-10-CM

## 2013-11-05 ENCOUNTER — Encounter (INDEPENDENT_AMBULATORY_CARE_PROVIDER_SITE_OTHER): Payer: Medicare Other | Admitting: Physical Therapy

## 2013-11-05 DIAGNOSIS — M47812 Spondylosis without myelopathy or radiculopathy, cervical region: Secondary | ICD-10-CM

## 2013-11-05 DIAGNOSIS — M6281 Muscle weakness (generalized): Secondary | ICD-10-CM

## 2013-11-05 DIAGNOSIS — R293 Abnormal posture: Secondary | ICD-10-CM

## 2013-11-05 DIAGNOSIS — M542 Cervicalgia: Secondary | ICD-10-CM

## 2013-11-07 ENCOUNTER — Ambulatory Visit: Payer: Medicare Other | Admitting: Sports Medicine

## 2013-12-14 ENCOUNTER — Other Ambulatory Visit: Payer: Self-pay | Admitting: Internal Medicine

## 2013-12-24 ENCOUNTER — Ambulatory Visit (INDEPENDENT_AMBULATORY_CARE_PROVIDER_SITE_OTHER): Payer: Medicare Other | Admitting: *Deleted

## 2013-12-24 DIAGNOSIS — Z23 Encounter for immunization: Secondary | ICD-10-CM

## 2013-12-27 ENCOUNTER — Encounter: Payer: Self-pay | Admitting: Internal Medicine

## 2014-01-18 ENCOUNTER — Other Ambulatory Visit: Payer: Self-pay | Admitting: Internal Medicine

## 2014-01-23 ENCOUNTER — Ambulatory Visit: Payer: Medicare Other

## 2014-01-23 ENCOUNTER — Ambulatory Visit (INDEPENDENT_AMBULATORY_CARE_PROVIDER_SITE_OTHER): Payer: Medicare Other

## 2014-01-23 ENCOUNTER — Encounter: Payer: Self-pay | Admitting: Internal Medicine

## 2014-01-23 DIAGNOSIS — Z2911 Encounter for prophylactic immunotherapy for respiratory syncytial virus (RSV): Secondary | ICD-10-CM

## 2014-01-23 DIAGNOSIS — Z23 Encounter for immunization: Secondary | ICD-10-CM

## 2014-02-14 ENCOUNTER — Other Ambulatory Visit: Payer: Self-pay

## 2014-02-14 DIAGNOSIS — I498 Other specified cardiac arrhythmias: Secondary | ICD-10-CM

## 2014-02-14 MED ORDER — VERAPAMIL HCL ER 120 MG PO TBCR: 120.0000 mg | EXTENDED_RELEASE_TABLET | Freq: Every day | ORAL | Status: DC

## 2014-03-03 ENCOUNTER — Encounter: Payer: Self-pay | Admitting: Internal Medicine

## 2014-04-09 ENCOUNTER — Encounter: Payer: Self-pay | Admitting: Vascular Surgery

## 2014-04-09 ENCOUNTER — Ambulatory Visit (INDEPENDENT_AMBULATORY_CARE_PROVIDER_SITE_OTHER): Payer: Medicare Other | Admitting: Vascular Surgery

## 2014-04-09 VITALS — BP 115/77 | HR 74 | Temp 98.2°F | Resp 16 | Ht 66.5 in | Wt 210.0 lb

## 2014-04-09 DIAGNOSIS — T82598A Other mechanical complication of other cardiac and vascular devices and implants, initial encounter: Secondary | ICD-10-CM

## 2014-04-09 DIAGNOSIS — N186 End stage renal disease: Secondary | ICD-10-CM

## 2014-04-09 NOTE — Progress Notes (Signed)
   Patient name: Tabitha Green MRN: [id number] DOB: August 15, 1945 Sex: female  REASON FOR VISIT: evaluate right upper arm AV fistula  HPI: Tabitha Green is a 68 y.o. female who had a right upper arm AV fistula placed in 2012. She was sent over today for evaluation of 2 areas where there is some thinning of the skin overlying her fistula. She denies fever or chills. He dialyzes on Monday Wednesdays and Fridays her fistula has been working well. She has no specific complaints.   REVIEW OF SYSTEMS: Valu.Nieves ] denotes positive finding; [  ] denotes negative finding  CARDIOVASCULAR:  [ ]  chest pain   [ ]  dyspnea on exertion    CONSTITUTIONAL:  [ ]  fever   [ ]  chills  PHYSICAL EXAM: Filed Vitals:   04/09/14 1138  BP: 115/77  Pulse: 74  Temp: 98.2 F (36.8 C)  TempSrc: Oral  Resp: 16  Height: 5' 6.5" (1.689 m)  Weight: 210 lb (95.255 kg)  SpO2: 98%   Body mass index is 33.39 kg/(m^2). GENERAL: The patient is a well-nourished female, in no acute distress. The vital signs are documented above. CARDIOVASCULAR: There is a regular rate and rhythm. PULMONARY: There is good air exchange bilaterally without wheezing or rales. Her fistula in the right upper arm is aneurysmal. It has an excellent thrill. I cannot palpate a radial pulse however the right hand is warm and well-perfused. There are 2 separate areas overlying the fistula where the skin is somewhat excoriated but there is no ulcer and there appears to be healthy tissue between the skin and the fistula.  MEDICAL ISSUES:  End stage renal disease I think that the 2 areas of concern should heal with aggressive wound care. I've instructed her to keep the wounds moist by applying bacitracin and covering them with a Band-Aid. I'll plan on seeing her back in 2 weeks to check on her wound. If these did not heal then I would recommend excising the one area in the central part of the fistula and plicating the aneurysmal segment here. However, currently I  think that this was caught early enough that this should heal with aggressive wound care.   [facility] Beeper: [phone]

## 2014-04-09 NOTE — Assessment & Plan Note (Signed)
I think that the 2 areas of concern should heal with aggressive wound care. I've instructed her to keep the wounds moist by applying bacitracin and covering them with a Band-Aid. I'll plan on seeing her back in 2 weeks to check on her wound. If these did not heal then I would recommend excising the one area in the central part of the fistula and plicating the aneurysmal segment here. However, currently I think that this was caught early enough that this should heal with aggressive wound care.

## 2014-05-01 ENCOUNTER — Encounter: Payer: Self-pay | Admitting: Vascular Surgery

## 2014-05-02 ENCOUNTER — Ambulatory Visit (INDEPENDENT_AMBULATORY_CARE_PROVIDER_SITE_OTHER): Payer: Medicare Other | Admitting: Vascular Surgery

## 2014-05-02 ENCOUNTER — Encounter: Payer: Self-pay | Admitting: Vascular Surgery

## 2014-05-02 VITALS — BP 114/73 | HR 73 | Resp 16 | Ht 66.5 in | Wt 213.0 lb

## 2014-05-02 DIAGNOSIS — N186 End stage renal disease: Secondary | ICD-10-CM

## 2014-05-02 NOTE — Progress Notes (Signed)
   Patient name: Tabitha Green MRN: [id number] DOB: September 14, 1945 Sex: female  REASON FOR VISIT: follow up of right upper arm AV fistula  HPI: Tabitha Green is a 68 y.o. female who I saw on 04/09/14 to evaluate 2 areas where there was some thinning overlying her right upper arm fistula. I felt that these 2 areas of concern would heal with aggressive wound care and she comes in for a two-week follow up visit. She dialyzes on Monday Wednesdays and Fridays. She has no specific complaints.  REVIEW OF SYSTEMS: Valu.Nieves ] denotes positive finding; [  ] denotes negative finding  CARDIOVASCULAR:  [ ]  chest pain   [ ]  dyspnea on exertion    CONSTITUTIONAL:  [ ]  fever   [ ]  chills  PHYSICAL EXAM: Filed Vitals:   05/02/14 1242  BP: 114/73  Pulse: 73  Resp: 16  Height: 5' 6.5" (1.689 m)  Weight: 213 lb (96.616 kg)   Body mass index is 33.87 kg/(m^2). GENERAL: The patient is a well-nourished female, in no acute distress. The vital signs are documented above. CARDIOVASCULAR: There is a regular rate and rhythm. PULMONARY: There is good air exchange bilaterally without wheezing or rales. The 2 areas of concern have improved. The skin that is intact with only minimal redness.   MEDICAL ISSUES: I have again encouraged her to have the dialysis nurses not stick in these 2 areas of concern. I think these will heal without any problem. I will be happy to see her back if any new issues arise.  [facility] [place] Beeper: [phone]

## 2014-05-15 ENCOUNTER — Other Ambulatory Visit: Payer: Self-pay | Admitting: Internal Medicine

## 2014-05-15 DIAGNOSIS — Z1231 Encounter for screening mammogram for malignant neoplasm of breast: Secondary | ICD-10-CM

## 2014-05-22 ENCOUNTER — Ambulatory Visit (INDEPENDENT_AMBULATORY_CARE_PROVIDER_SITE_OTHER): Payer: Medicare Other

## 2014-05-22 DIAGNOSIS — Z1231 Encounter for screening mammogram for malignant neoplasm of breast: Secondary | ICD-10-CM

## 2014-06-05 ENCOUNTER — Ambulatory Visit (INDEPENDENT_AMBULATORY_CARE_PROVIDER_SITE_OTHER): Payer: Medicare Other | Admitting: Obstetrics & Gynecology

## 2014-06-05 ENCOUNTER — Encounter: Payer: Self-pay | Admitting: Obstetrics & Gynecology

## 2014-06-05 VITALS — BP 147/79 | HR 69 | Resp 16 | Ht 66.0 in | Wt 218.0 lb

## 2014-06-05 DIAGNOSIS — B372 Candidiasis of skin and nail: Secondary | ICD-10-CM

## 2014-06-05 DIAGNOSIS — L309 Dermatitis, unspecified: Secondary | ICD-10-CM

## 2014-06-05 DIAGNOSIS — Z01419 Encounter for gynecological examination (general) (routine) without abnormal findings: Secondary | ICD-10-CM

## 2014-06-05 MED ORDER — NYSTATIN 100000 UNIT/GM EX POWD: 1.0000 g | Freq: Two times a day (BID) | CUTANEOUS | Status: DC

## 2014-06-05 MED ORDER — TRIAMCINOLONE ACETONIDE 0.1 % EX CREA: 1.0000 "application " | TOPICAL_CREAM | Freq: Two times a day (BID) | CUTANEOUS | Status: DC

## 2014-06-05 NOTE — Progress Notes (Signed)
  Subjective:    Tabitha Green is a 68 y.o. female who presents for an annual exam. The patient has no complaints today. The patient is not currently sexually active. GYN screening history: last pap: was normal. The patient wears seatbelts: yes. The patient participates in regular exercise: no.  Pt c/o rash under breast since summer (yeast) Pt c/s itching at top buttocks for several weeks.  Pt c/0 right vulvar itching and bleeding when drying off with towel.  Pt denies vaginal bleeding.  Itching has been present fo rmonths.  Menstrual History: OB History   Grav Para Term Preterm Abortions TAB SAB Ect Mult Living   2 2 2  0 0 0 0 0 0 2       No LMP recorded. Patient has had a hysterectomy.    The following portions of the patient's history were reviewed and updated as appropriate: allergies, current medications, past family history, past medical history, past social history, past surgical history and problem list.  Review of Systems Pertinent items are noted in HPI.    Objective:      Filed Vitals:   06/05/14 1414  BP: 147/79  Pulse: 69  Resp: 16  Height: 5\' 6"  (1.676 m)  Weight: 218 lb (98.884 kg)   Vitals:  WNL General appearance: alert, cooperative and no distress Head: Normocephalic, without obvious abnormality, atraumatic Eyes: negative Throat: lips, mucosa, and tongue normal; teeth and gums normal--(pt does not floss) Lungs: clear to auscultation bilaterally Breasts: normal appearance, no masses or tenderness, No nipple retraction or dimpling, No nipple discharge or bleeding Heart: regular rate and rhythm Abdomen: soft, non-tender; bowel sounds normal; no masses,  no organomegaly Skin:  Evidence of cutaneous yeast under breast  Pelvic:  External Genitalia:  Tanner V, 3 cm flat ulcer on left labia.  No other lesions Urethra:  No prolapse Vagina:  Pale pink, normal rugae, no blood or discharge Cervix:  Surgically absent Uterus:  Surgically absent Adnexa:  Normal,  no masses, non tender  Extremities: Dialysis shunt in right arm with moderate bruising Lymph nodes: Axillary adenopathy: none   .    Assessment:    Healthy female exam.   Left labial ulcer--biopsy next week. Dermitis at buttocks--kenalog bid for 2 weeks; if not better go to primary care MD Cutaneous yeast under breasts--nystatin powder   Plan:    See above No pap smear indicated Up to date on mammogram

## 2014-06-09 ENCOUNTER — Encounter: Payer: Self-pay | Admitting: Obstetrics & Gynecology

## 2014-06-10 ENCOUNTER — Ambulatory Visit (INDEPENDENT_AMBULATORY_CARE_PROVIDER_SITE_OTHER): Payer: Medicare Other | Admitting: Internal Medicine

## 2014-06-10 ENCOUNTER — Encounter: Payer: Self-pay | Admitting: Internal Medicine

## 2014-06-10 VITALS — BP 148/86 | HR 72 | Temp 98.4°F | Resp 14 | Wt 218.4 lb

## 2014-06-10 DIAGNOSIS — B372 Candidiasis of skin and nail: Secondary | ICD-10-CM

## 2014-06-10 DIAGNOSIS — L309 Dermatitis, unspecified: Secondary | ICD-10-CM

## 2014-06-10 DIAGNOSIS — J310 Chronic rhinitis: Secondary | ICD-10-CM

## 2014-06-10 MED ORDER — KETOCONAZOLE 2 % EX CREA: 1.0000 "application " | TOPICAL_CREAM | Freq: Two times a day (BID) | CUTANEOUS | Status: DC

## 2014-06-10 NOTE — Progress Notes (Signed)
Pre visit review using our clinic review tool, if applicable. No additional management support is needed unless otherwise documented below in the visit note. 

## 2014-06-10 NOTE — Progress Notes (Signed)
   Subjective:    Patient ID: Tabitha Green, female    DOB: 04/30/1946, 68 y.o.   MRN: [id number]  HPI   She saw her Gynecologist last week and was felt to have psoriatic plaque in the intergluteal area. She was prescribed triamcinolone which she has been using since that time. She's been unable to assess the lesion  She also has an inframammary rash bilaterally which she relates to sweating.   She remains of the kidney transplant list at [facility]. At present she second on a list for transplant. She had her preop evaluation including treadmill in April of this year      Review of Systems  She will have intermittent maxillary sinus congestion without signs of upper respiratory tract infection  No associated itchy, watery eyes.  Swelling of the lips or tongue denied. Shortness of breath, wheezing, or cough absent. No urticaria noted. Fever ,chills , or sweats denied. Purulence absent. Diarrhea not present.     Objective:   Physical Exam   Positive or pertinent findings include: She has a sallow complexion and facial wrinkling. Anisocoria is present with the left pupil greater than the right She has osteomata of the mandible. She has a grade 2 systolic murmur related to an AV shunt in the right upper extremity Accentuated curvature of the upper thoracic spine She has faint ,irreegular bland rash in the inframammary areas There is a pea-sized eczematoid plaque in the intergluteal area.  General appearance :adequately nourished; in no distress. Eyes: No conjunctival inflammation or scleral icterus is present. Oral exam: Dental hygiene is good. Lips and gums are healthy appearing.There is no oropharyngeal erythema or exudate noted.  Heart:  Normal rate and regular rhythm. S1 and S2 normal without gallop, click, rub or other extra sounds   Lungs:Chest clear to auscultation; no wheezes, rhonchi,rales ,or rubs present.No increased work of breathing.  Skin:Warm &  dry.  Intact without suspicious lesions  ; no jaundice or tenting Lymphatic: No lymphadenopathy is noted about the head, neck, axilla            Assessment & Plan:  #1 superior intragluteal eczematoid plaque; triamcinolone daily to twice a day is appropriate  #2 Candidal inframammary dermatitis  #3 rhinitis  Plan: See orders and after visit summary

## 2014-06-10 NOTE — Patient Instructions (Signed)
Plain Mucinex (NOT D) for thick secretions ;force NON dairy fluids .   Nasal cleansing in the shower as discussed with lather of mild shampoo.After 10 seconds wash off lather while  exhaling through nostrils. Make sure that all residual soap is removed to prevent irritation.  Flonase OR Nasacort AQ 1 spray in each nostril twice a day as needed. Use the "crossover" technique into opposite nostril spraying toward opposite ear @ 45 degree angle, not straight up into nostril. Plain Allegra (NOT D )  160 daily , Loratidine 10 mg , OR Zyrtec 10 mg @ bedtime  as needed for itchy eyes & sneezing.  Keep the inframammary area as dry as possible. Apply Nizoral twice a day until rash is gone.

## 2014-06-12 ENCOUNTER — Ambulatory Visit (INDEPENDENT_AMBULATORY_CARE_PROVIDER_SITE_OTHER): Payer: Medicare Other | Admitting: Obstetrics & Gynecology

## 2014-06-12 ENCOUNTER — Encounter: Payer: Self-pay | Admitting: Obstetrics & Gynecology

## 2014-06-12 VITALS — BP 155/84 | HR 70 | Resp 16 | Ht 66.0 in | Wt 218.0 lb

## 2014-06-12 DIAGNOSIS — R21 Rash and other nonspecific skin eruption: Secondary | ICD-10-CM

## 2014-06-12 DIAGNOSIS — N7689 Other specified inflammation of vagina and vulva: Secondary | ICD-10-CM

## 2014-06-12 NOTE — Progress Notes (Signed)
VULVAR BIOPSY NOTE  The indications for vulvar biopsy (rule out neoplasia, establish lichen sclerosus diagnosis) were reviewed.   Risks of the biopsy including pain, bleeding, infection, inadequate specimen, scarring and need for additional procedures  were discussed. The patient stated understanding and agreed to undergo procedure today. Consent was signed,  time out performed.  The patient's vulva was prepped with Betadine. 1% lidocaine was injected into two areas one on the right labia majoraa nd one on the left labia majora. A 3-mm punch biopsy was done, biopsy tissue was picked up with sterile forceps and sterile scissors were used to excise the lesion.  Small bleeding was noted and hemostasis was achieved using silver nitrate sticks.  The patient tolerated the procedure well. Post-procedure instructions  (pelvic rest for one week) were given to the patient. The patient is to call with heavy bleeding, fever greater than 100.4, foul smelling vaginal discharge or other concerns. The patient will be return to clinic in two weeks for discussion of results.

## 2014-06-12 NOTE — Patient Instructions (Signed)
Vulva Biopsy Care After These instructions give you information on caring for yourself after your procedure. Your doctor may also give you more specific instructions. Call your doctor if you have any problems or questions after your procedure. HOME CARE   Only take medicine as told by your doctor.  Do not rub the biopsy area after you pee (urinate). Gently pat the area dry. You can use a bottle filled with warm water (peri-bottle) to clean the area. Gently wipe from front to back.  Clean the area using water and mild soap. Gently pat the area dry.  Check the biopsy area every day using a mirror. Make sure it is healing.  Do not have sex (intercourse) for 3-4 days or as told by your doctor.  Avoid exercise (running, biking) for 2-3 days or as told by your doctor.  You may shower after the procedure. Avoid bathing and swimming until the stitches (sutures) dissolve and healing is complete.  Wear loose cotton underwear. Do not wear tight pants.  Keep all follow-up visits with your doctor.  Call your doctor to get your test results. GET HELP RIGHT AWAY IF:   You have pain that gets worse and is not helped by pain medicine.  Your vaginal area becomes puffy (swollen) or red.  You have fluid or an abnormally bad smell coming from your vagina.  You have a fever. MAKE SURE YOU:  Understand these instructions.  Will watch your condition.  Will get help right away if you are not doing well or get worse. Document Released: 10/21/2008 Document Revised: 07/11/2012 Document Reviewed: 05/21/2012 [facility] Patient Information 2015 [place], [place]. This information is not intended to replace advice given to you by your health care provider. Make sure you discuss any questions you have with your health care provider.

## 2014-06-19 ENCOUNTER — Telehealth: Payer: Self-pay

## 2014-06-19 NOTE — Telephone Encounter (Signed)
Called patient//lm with husband to call back to schedule appt.

## 2014-06-20 ENCOUNTER — Encounter: Payer: Self-pay | Admitting: *Deleted

## 2014-06-20 ENCOUNTER — Emergency Department
Admission: EM | Admit: 2014-06-20 | Discharge: 2014-06-20 | Disposition: A | Discharge status: Home / Self Care | Payer: Medicare Other | Source: Home / Self Care | Attending: Emergency Medicine | Admitting: Emergency Medicine

## 2014-06-20 DIAGNOSIS — M62838 Other muscle spasm: Secondary | ICD-10-CM

## 2014-06-20 DIAGNOSIS — S161XXA Strain of muscle, fascia and tendon at neck level, initial encounter: Secondary | ICD-10-CM

## 2014-06-20 DIAGNOSIS — M6248 Contracture of muscle, other site: Secondary | ICD-10-CM

## 2014-06-20 MED ORDER — CYCLOBENZAPRINE HCL 5 MG PO TABS: ORAL_TABLET | ORAL | Status: DC

## 2014-06-20 NOTE — ED Notes (Addendum)
Demi c/o right shoulder/neck pain without injury x 3 days. Used Bengay and tylenol otc without much relief. Similar visit 10/05/13, suspected cervical radiculopathy.

## 2014-06-20 NOTE — ED Provider Notes (Signed)
CSN: [id number]     Arrival date & time 06/20/14  1622 History   First MD Initiated Contact with Patient 06/20/14 1719     Chief Complaint  Patient presents with  . Shoulder Pain    HPI Sharp Right posterior-lateral neck pain and spasm, for 3 days, intensity 5 out of 10. Worse with changing position. Recalls no injury. But she has been very active the past several days doing various tasks. Denies headache. Denies focal neurologic symptoms. No radiation or weakness or numbness. She feels the right posterior neck strain/spasm feels completely different compared to her symptoms back in February 2015.  Past Medical History  Diagnosis Date  . Hypertension   . Hypothyroidism   . Gout   . Arthritis   . Anemia     r/t kidney function- receives Procrit  . Cancer     kidney  . FSGS (focal segmental glomerulosclerosis)     right kidney;  . Osteopenia   . Diverticulosis     a. 05/2012 colonoscopy  . CKD (chronic kidney disease)     followed by Dr. [name].  . SVT (supraventricular tachycardia)    Past Surgical History  Procedure Laterality Date  . Total abdominal hysterectomy w/ bilateral salpingoophorectomy      fibroids  . Nephrectomy  1993    for malignancy- left  . Colonoscopy with polypectomy      Dr Henrene Pastor  . Av fistula placement      pt has not had to start dialysis  . Renal biopsy      right  . Colonoscopy    . Tee without cardioversion N/A 08/27/2013    Procedure: TRANSESOPHAGEAL ECHOCARDIOGRAM (TEE);  Surgeon: [name], MD;  [facility] ENDOSCOPY;  Service: Cardiovascular;  Laterality: N/A;   Family History  Problem Relation Age of Onset  . Deep vein thrombosis Mother     post thyroid surgery  . Hypertension Mother   . Heart attack Father 52    deceased  . Hypertension Father   . Cancer Sister     breast  . Cancer Paternal Aunt     pancreatic  . Diabetes Maternal Grandfather   . Stroke Paternal Grandmother     in 17s  . Colon cancer Neg Hx   .  Esophageal cancer Neg Hx   . Stomach cancer Neg Hx   . Rectal cancer Neg Hx    History  Substance Use Topics  . Smoking status: Never Smoker   . Smokeless tobacco: Never Used  . Alcohol Use: Yes     Comment: rarely   OB History    Gravida Para Term Preterm AB TAB SAB Ectopic Multiple Living   2 2 2  0 0 0 0 0 0 2     Review of Systems  All other systems reviewed and are negative.   Allergies  Cefaclor; Naproxen; Enalapril maleate; and Metoprolol tartrate  Home Medications   Prior to Admission medications   Medication Sig Start Date End Date Taking? Authorizing Provider  acetaminophen (TYLENOL) 500 MG tablet Take 500-750 mg by mouth every 6 (six) hours as needed for pain.    Historical Provider, MD  aspirin EC 81 MG EC tablet Take 1 tablet (81 mg total) by mouth daily. 12/09/12   Brooke O Edmisten, PA-C  AYR SALINE NASAL DROPS NA Place 2 sprays into both nostrils daily.    Historical Provider, MD  CALCIUM CARBONATE PO Take 2 tablets by mouth 3 (three) times daily.  Historical Provider, MD  Camphor-Eucalyptus-Menthol (VICKS VAPORUB EX) Apply 1 application topically daily.    Historical Provider, MD  cyclobenzaprine (FLEXERIL) 5 MG tablet Take 1 every 8 hours as needed for muscle relaxant. Caution: May cause drowsiness. 06/20/14   Jacqulyn Cane, MD  desonide (DESOWEN) 0.05 % cream  03/26/14   Historical Provider, MD  econazole nitrate 1 % cream  03/26/14   Historical Provider, MD  fluticasone (FLONASE) 50 MCG/ACT nasal spray Place 1 spray into the nose 2 (two) times daily as needed for rhinitis. 05/16/13   Hendricks Limes, MD  ketoconazole (NIZORAL) 2 % cream Apply 1 application topically 2 (two) times daily. 06/10/14   Hendricks Limes, MD  levothyroxine Wilmer Floor, LEVOTHROID) 175 MCG tablet Take 1 tablet daily except Tuesday and Thursday take 1.5 tablets 06/13/13   Hendricks Limes, MD  loratadine (CLARITIN) 10 MG tablet Take 20 mg by mouth as needed for allergies.    Historical  Provider, MD  multivitamin (RENA-VIT) TABS tablet Take 1 tablet by mouth daily. 12/17/12   Samella Parr, NP  nystatin (MYCOSTATIN/NYSTOP) 100000 UNIT/GM POWD Apply 1 g topically 2 (two) times daily. 06/05/14   Guss Bunde, MD  propranolol (INDERAL) 40 MG tablet Take 1 / 2 tablet once daily on Monday, Wednesday, and Friday (dialysis day)  and take 1/ 2 tablet  twice a day on Sunday, Tuesday, Thursday, and Saturday 06/12/13   Hendricks Limes, MD  triamcinolone cream (KENALOG) 0.1 % Apply 1 application topically 2 (two) times daily. 06/05/14   Guss Bunde, MD  verapamil (CALAN-SR) 120 MG CR tablet Take 120 mg by mouth as directed. 02/14/14   Hendricks Limes, MD   BP 137/85 mmHg  Pulse 82  Resp 14  Wt 216 lb (97.977 kg)  SpO2 94% Physical Exam  Constitutional: She is oriented to person, place, and time. She appears well-developed and well-nourished. No distress.  HENT:  Head: Normocephalic and atraumatic.  Eyes: Conjunctivae and EOM are normal. Pupils are equal, round, and reactive to light. No scleral icterus.  Neck: Normal range of motion.  Cardiovascular: Normal rate.   Pulmonary/Chest: Effort normal.  Abdominal: She exhibits no distension.  Musculoskeletal: Normal range of motion.  Tender, spasm, right posterior lateral cervical muscles. No C-spine tenderness or deformity. Motor and sensory upper extremities within normal limits. No focal deficits  Neurological: She is alert and oriented to person, place, and time.  Skin: Skin is warm. No rash noted.  Psychiatric: She has a normal mood and affect.  Nursing note and vitals reviewed.   ED Course  Procedures (including critical care time) Labs Review Labs Reviewed - No data to display  Imaging Review No results found.   MDM   1. Neck strain, initial encounter   2. Muscle spasms of neck    Over 25 minutes spent, greater than 50% of the time spent for counseling and coordination of care.   No neurologic deficit of  her extremities.  No C-spine tenderness or deformity. Although she has history of mild spondylosis of C-spine with multilevel disc disease C4-5 to C6-7, see x-ray of C-spine 10/05/13, she feels the right posterior neck strain/spasm feels completely different compared to her symptoms back in February 2015. Treatment options discussed, as well as risks, benefits, alternatives. Patient voiced understanding and agreement with the following plans: New Prescriptions   CYCLOBENZAPRINE (FLEXERIL) 5 MG TABLET    Take 1 every 8 hours as needed for muscle relaxant. Caution: May cause  drowsiness.   Tylenol, 2 every 4-6 hours when necessary pain. She is on dialysis, so I advised that she check with her kidney specialist if she is allowed to take any other medication. Follow-up with your primary care doctor in 5-7 days if not improving, or sooner if symptoms become worse. Precautions discussed. Red flags discussed. Questions invited and answered. Patient voiced understanding and agreement.   Jacqulyn Cane, MD 06/20/14 [phone]

## 2014-06-22 ENCOUNTER — Other Ambulatory Visit: Payer: Self-pay | Admitting: Obstetrics & Gynecology

## 2014-06-22 ENCOUNTER — Encounter: Payer: Self-pay | Admitting: Obstetrics & Gynecology

## 2014-06-22 DIAGNOSIS — N766 Ulceration of vulva: Secondary | ICD-10-CM | POA: Insufficient documentation

## 2014-06-22 MED ORDER — CLOBETASOL PROPIONATE 0.05 % EX OINT: 1.0000 "application " | TOPICAL_OINTMENT | Freq: Two times a day (BID) | CUTANEOUS | Status: DC

## 2014-06-22 NOTE — Progress Notes (Signed)
Right labial biopsy:  -EROSIVE CHRONIC MUCOSITIS WITH EOSINOPHILS. - NO FUNGI, DYSPLASIA OR MALIGNANCY.  Rx with Temovate bid for 2 weeks then once a day for 2 weeks.  If not markedly better in 2 weeks, pt needs to come back to office and will need subspecialty referral.

## 2014-06-23 ENCOUNTER — Encounter: Payer: Self-pay | Admitting: *Deleted

## 2014-06-23 NOTE — Progress Notes (Signed)
Called pt to adv Temovate sent to pharmacy. Pt stated she will use Temovate for 2 weeks and see how it does before making a follow up appt.

## 2014-06-24 ENCOUNTER — Encounter: Payer: Medicare Other | Admitting: Internal Medicine

## 2014-07-17 ENCOUNTER — Encounter: Payer: Self-pay | Admitting: Internal Medicine

## 2014-07-23 ENCOUNTER — Other Ambulatory Visit: Payer: Self-pay | Admitting: Nurse Practitioner

## 2014-07-27 ENCOUNTER — Other Ambulatory Visit: Payer: Self-pay | Admitting: Internal Medicine

## 2014-08-05 ENCOUNTER — Ambulatory Visit (INDEPENDENT_AMBULATORY_CARE_PROVIDER_SITE_OTHER): Payer: Medicare Other | Admitting: Obstetrics & Gynecology

## 2014-08-05 ENCOUNTER — Encounter: Payer: Self-pay | Admitting: Obstetrics & Gynecology

## 2014-08-05 VITALS — BP 146/82 | HR 67 | Resp 16 | Ht 66.0 in | Wt 217.0 lb

## 2014-08-05 DIAGNOSIS — N766 Ulceration of vulva: Secondary | ICD-10-CM

## 2014-08-06 ENCOUNTER — Encounter: Payer: Self-pay | Admitting: Internal Medicine

## 2014-08-06 NOTE — Progress Notes (Signed)
   Subjective:    Patient ID: Tabitha Green, female    DOB: [birth year], 68 y.o.   MRN: [id number]  HPI  Pt presents for f/u of ulcer on labia.  Pt stopped using temovate but has started having more symptoms.  NO bleeding.  On list for kidney transplant.  Review of Systems    see above Objective:   Physical Exam  Genitourinary:             Assessment & Plan:  Resume temovate bid for 2 weeks and come to office for evaluation.  If not better refer to vulvologist. This condition should have no contraindication to kidney transplant.

## 2014-08-09 DIAGNOSIS — D689 Coagulation defect, unspecified: Secondary | ICD-10-CM | POA: Diagnosis not present

## 2014-08-09 DIAGNOSIS — D631 Anemia in chronic kidney disease: Secondary | ICD-10-CM | POA: Diagnosis not present

## 2014-08-09 DIAGNOSIS — N2581 Secondary hyperparathyroidism of renal origin: Secondary | ICD-10-CM | POA: Diagnosis not present

## 2014-08-09 DIAGNOSIS — D509 Iron deficiency anemia, unspecified: Secondary | ICD-10-CM | POA: Diagnosis not present

## 2014-08-09 DIAGNOSIS — N186 End stage renal disease: Secondary | ICD-10-CM | POA: Diagnosis not present

## 2014-08-11 DIAGNOSIS — D689 Coagulation defect, unspecified: Secondary | ICD-10-CM | POA: Diagnosis not present

## 2014-08-11 DIAGNOSIS — N186 End stage renal disease: Secondary | ICD-10-CM | POA: Diagnosis not present

## 2014-08-11 DIAGNOSIS — D509 Iron deficiency anemia, unspecified: Secondary | ICD-10-CM | POA: Diagnosis not present

## 2014-08-11 DIAGNOSIS — D631 Anemia in chronic kidney disease: Secondary | ICD-10-CM | POA: Diagnosis not present

## 2014-08-11 DIAGNOSIS — N2581 Secondary hyperparathyroidism of renal origin: Secondary | ICD-10-CM | POA: Diagnosis not present

## 2014-08-13 DIAGNOSIS — N2581 Secondary hyperparathyroidism of renal origin: Secondary | ICD-10-CM | POA: Diagnosis not present

## 2014-08-13 DIAGNOSIS — D631 Anemia in chronic kidney disease: Secondary | ICD-10-CM | POA: Diagnosis not present

## 2014-08-13 DIAGNOSIS — D509 Iron deficiency anemia, unspecified: Secondary | ICD-10-CM | POA: Diagnosis not present

## 2014-08-13 DIAGNOSIS — D689 Coagulation defect, unspecified: Secondary | ICD-10-CM | POA: Diagnosis not present

## 2014-08-13 DIAGNOSIS — N186 End stage renal disease: Secondary | ICD-10-CM | POA: Diagnosis not present

## 2014-08-15 DIAGNOSIS — D509 Iron deficiency anemia, unspecified: Secondary | ICD-10-CM | POA: Diagnosis not present

## 2014-08-15 DIAGNOSIS — D631 Anemia in chronic kidney disease: Secondary | ICD-10-CM | POA: Diagnosis not present

## 2014-08-15 DIAGNOSIS — N186 End stage renal disease: Secondary | ICD-10-CM | POA: Diagnosis not present

## 2014-08-15 DIAGNOSIS — N2581 Secondary hyperparathyroidism of renal origin: Secondary | ICD-10-CM | POA: Diagnosis not present

## 2014-08-15 DIAGNOSIS — D689 Coagulation defect, unspecified: Secondary | ICD-10-CM | POA: Diagnosis not present

## 2014-08-18 ENCOUNTER — Ambulatory Visit (INDEPENDENT_AMBULATORY_CARE_PROVIDER_SITE_OTHER): Payer: Medicare Other | Admitting: Obstetrics & Gynecology

## 2014-08-18 ENCOUNTER — Encounter: Payer: Self-pay | Admitting: Obstetrics & Gynecology

## 2014-08-18 VITALS — BP 144/81 | HR 79 | Resp 16 | Ht 66.0 in | Wt 214.0 lb

## 2014-08-18 DIAGNOSIS — N766 Ulceration of vulva: Secondary | ICD-10-CM

## 2014-08-18 DIAGNOSIS — D509 Iron deficiency anemia, unspecified: Secondary | ICD-10-CM | POA: Diagnosis not present

## 2014-08-18 DIAGNOSIS — D631 Anemia in chronic kidney disease: Secondary | ICD-10-CM | POA: Diagnosis not present

## 2014-08-18 DIAGNOSIS — N2581 Secondary hyperparathyroidism of renal origin: Secondary | ICD-10-CM | POA: Diagnosis not present

## 2014-08-18 DIAGNOSIS — D689 Coagulation defect, unspecified: Secondary | ICD-10-CM | POA: Diagnosis not present

## 2014-08-18 DIAGNOSIS — N186 End stage renal disease: Secondary | ICD-10-CM | POA: Diagnosis not present

## 2014-08-18 NOTE — Progress Notes (Signed)
Pt has been using Temovate bid for 2 weeks.    On physical exam, there is little to no improvement.    Would like to refer patient to vulvar specialist / dermatologist to evaluate ulcer and treatment plan. Will try Daniel Nones in [place] or [place].

## 2014-08-20 DIAGNOSIS — N186 End stage renal disease: Secondary | ICD-10-CM | POA: Diagnosis not present

## 2014-08-20 DIAGNOSIS — N2581 Secondary hyperparathyroidism of renal origin: Secondary | ICD-10-CM | POA: Diagnosis not present

## 2014-08-20 DIAGNOSIS — D689 Coagulation defect, unspecified: Secondary | ICD-10-CM | POA: Diagnosis not present

## 2014-08-20 DIAGNOSIS — D509 Iron deficiency anemia, unspecified: Secondary | ICD-10-CM | POA: Diagnosis not present

## 2014-08-20 DIAGNOSIS — D631 Anemia in chronic kidney disease: Secondary | ICD-10-CM | POA: Diagnosis not present

## 2014-08-22 DIAGNOSIS — D509 Iron deficiency anemia, unspecified: Secondary | ICD-10-CM | POA: Diagnosis not present

## 2014-08-22 DIAGNOSIS — N186 End stage renal disease: Secondary | ICD-10-CM | POA: Diagnosis not present

## 2014-08-22 DIAGNOSIS — D689 Coagulation defect, unspecified: Secondary | ICD-10-CM | POA: Diagnosis not present

## 2014-08-22 DIAGNOSIS — D631 Anemia in chronic kidney disease: Secondary | ICD-10-CM | POA: Diagnosis not present

## 2014-08-22 DIAGNOSIS — N2581 Secondary hyperparathyroidism of renal origin: Secondary | ICD-10-CM | POA: Diagnosis not present

## 2014-08-25 DIAGNOSIS — D509 Iron deficiency anemia, unspecified: Secondary | ICD-10-CM | POA: Diagnosis not present

## 2014-08-25 DIAGNOSIS — N2581 Secondary hyperparathyroidism of renal origin: Secondary | ICD-10-CM | POA: Diagnosis not present

## 2014-08-25 DIAGNOSIS — N186 End stage renal disease: Secondary | ICD-10-CM | POA: Diagnosis not present

## 2014-08-25 DIAGNOSIS — D631 Anemia in chronic kidney disease: Secondary | ICD-10-CM | POA: Diagnosis not present

## 2014-08-25 DIAGNOSIS — D689 Coagulation defect, unspecified: Secondary | ICD-10-CM | POA: Diagnosis not present

## 2014-08-27 DIAGNOSIS — N186 End stage renal disease: Secondary | ICD-10-CM | POA: Diagnosis not present

## 2014-08-27 DIAGNOSIS — D509 Iron deficiency anemia, unspecified: Secondary | ICD-10-CM | POA: Diagnosis not present

## 2014-08-27 DIAGNOSIS — N2581 Secondary hyperparathyroidism of renal origin: Secondary | ICD-10-CM | POA: Diagnosis not present

## 2014-08-27 DIAGNOSIS — D689 Coagulation defect, unspecified: Secondary | ICD-10-CM | POA: Diagnosis not present

## 2014-08-27 DIAGNOSIS — D631 Anemia in chronic kidney disease: Secondary | ICD-10-CM | POA: Diagnosis not present

## 2014-08-28 ENCOUNTER — Telehealth: Payer: Self-pay

## 2014-08-28 NOTE — Telephone Encounter (Signed)
Pt. Was calling to get info on Dr. Daniel Nones in [place]. Sent referral to Dr. Oletta Lamas on Jan. 15th

## 2014-08-29 DIAGNOSIS — N186 End stage renal disease: Secondary | ICD-10-CM | POA: Diagnosis not present

## 2014-08-29 DIAGNOSIS — D509 Iron deficiency anemia, unspecified: Secondary | ICD-10-CM | POA: Diagnosis not present

## 2014-08-29 DIAGNOSIS — D631 Anemia in chronic kidney disease: Secondary | ICD-10-CM | POA: Diagnosis not present

## 2014-08-29 DIAGNOSIS — N2581 Secondary hyperparathyroidism of renal origin: Secondary | ICD-10-CM | POA: Diagnosis not present

## 2014-08-29 DIAGNOSIS — D689 Coagulation defect, unspecified: Secondary | ICD-10-CM | POA: Diagnosis not present

## 2014-09-01 DIAGNOSIS — N186 End stage renal disease: Secondary | ICD-10-CM | POA: Diagnosis not present

## 2014-09-01 DIAGNOSIS — D689 Coagulation defect, unspecified: Secondary | ICD-10-CM | POA: Diagnosis not present

## 2014-09-01 DIAGNOSIS — L304 Erythema intertrigo: Secondary | ICD-10-CM | POA: Diagnosis not present

## 2014-09-01 DIAGNOSIS — D631 Anemia in chronic kidney disease: Secondary | ICD-10-CM | POA: Diagnosis not present

## 2014-09-01 DIAGNOSIS — D509 Iron deficiency anemia, unspecified: Secondary | ICD-10-CM | POA: Diagnosis not present

## 2014-09-01 DIAGNOSIS — N2581 Secondary hyperparathyroidism of renal origin: Secondary | ICD-10-CM | POA: Diagnosis not present

## 2014-09-03 DIAGNOSIS — D631 Anemia in chronic kidney disease: Secondary | ICD-10-CM | POA: Diagnosis not present

## 2014-09-03 DIAGNOSIS — D509 Iron deficiency anemia, unspecified: Secondary | ICD-10-CM | POA: Diagnosis not present

## 2014-09-03 DIAGNOSIS — N186 End stage renal disease: Secondary | ICD-10-CM | POA: Diagnosis not present

## 2014-09-03 DIAGNOSIS — N2581 Secondary hyperparathyroidism of renal origin: Secondary | ICD-10-CM | POA: Diagnosis not present

## 2014-09-03 DIAGNOSIS — D689 Coagulation defect, unspecified: Secondary | ICD-10-CM | POA: Diagnosis not present

## 2014-09-05 DIAGNOSIS — D689 Coagulation defect, unspecified: Secondary | ICD-10-CM | POA: Diagnosis not present

## 2014-09-05 DIAGNOSIS — D509 Iron deficiency anemia, unspecified: Secondary | ICD-10-CM | POA: Diagnosis not present

## 2014-09-05 DIAGNOSIS — N2581 Secondary hyperparathyroidism of renal origin: Secondary | ICD-10-CM | POA: Diagnosis not present

## 2014-09-05 DIAGNOSIS — N186 End stage renal disease: Secondary | ICD-10-CM | POA: Diagnosis not present

## 2014-09-05 DIAGNOSIS — D631 Anemia in chronic kidney disease: Secondary | ICD-10-CM | POA: Diagnosis not present

## 2014-09-07 DIAGNOSIS — N186 End stage renal disease: Secondary | ICD-10-CM | POA: Diagnosis not present

## 2014-09-07 DIAGNOSIS — Z992 Dependence on renal dialysis: Secondary | ICD-10-CM | POA: Diagnosis not present

## 2014-09-08 DIAGNOSIS — N2581 Secondary hyperparathyroidism of renal origin: Secondary | ICD-10-CM | POA: Diagnosis not present

## 2014-09-08 DIAGNOSIS — D509 Iron deficiency anemia, unspecified: Secondary | ICD-10-CM | POA: Diagnosis not present

## 2014-09-08 DIAGNOSIS — N186 End stage renal disease: Secondary | ICD-10-CM | POA: Diagnosis not present

## 2014-09-08 DIAGNOSIS — D689 Coagulation defect, unspecified: Secondary | ICD-10-CM | POA: Diagnosis not present

## 2014-09-09 ENCOUNTER — Ambulatory Visit: Payer: Medicare Other | Admitting: Internal Medicine

## 2014-09-10 DIAGNOSIS — D689 Coagulation defect, unspecified: Secondary | ICD-10-CM | POA: Diagnosis not present

## 2014-09-10 DIAGNOSIS — N2581 Secondary hyperparathyroidism of renal origin: Secondary | ICD-10-CM | POA: Diagnosis not present

## 2014-09-10 DIAGNOSIS — D509 Iron deficiency anemia, unspecified: Secondary | ICD-10-CM | POA: Diagnosis not present

## 2014-09-10 DIAGNOSIS — N186 End stage renal disease: Secondary | ICD-10-CM | POA: Diagnosis not present

## 2014-09-12 DIAGNOSIS — D509 Iron deficiency anemia, unspecified: Secondary | ICD-10-CM | POA: Diagnosis not present

## 2014-09-12 DIAGNOSIS — N2581 Secondary hyperparathyroidism of renal origin: Secondary | ICD-10-CM | POA: Diagnosis not present

## 2014-09-12 DIAGNOSIS — N186 End stage renal disease: Secondary | ICD-10-CM | POA: Diagnosis not present

## 2014-09-12 DIAGNOSIS — D689 Coagulation defect, unspecified: Secondary | ICD-10-CM | POA: Diagnosis not present

## 2014-09-15 DIAGNOSIS — D689 Coagulation defect, unspecified: Secondary | ICD-10-CM | POA: Diagnosis not present

## 2014-09-15 DIAGNOSIS — N2581 Secondary hyperparathyroidism of renal origin: Secondary | ICD-10-CM | POA: Diagnosis not present

## 2014-09-15 DIAGNOSIS — N186 End stage renal disease: Secondary | ICD-10-CM | POA: Diagnosis not present

## 2014-09-15 DIAGNOSIS — D509 Iron deficiency anemia, unspecified: Secondary | ICD-10-CM | POA: Diagnosis not present

## 2014-09-17 DIAGNOSIS — D689 Coagulation defect, unspecified: Secondary | ICD-10-CM | POA: Diagnosis not present

## 2014-09-17 DIAGNOSIS — D509 Iron deficiency anemia, unspecified: Secondary | ICD-10-CM | POA: Diagnosis not present

## 2014-09-17 DIAGNOSIS — N2581 Secondary hyperparathyroidism of renal origin: Secondary | ICD-10-CM | POA: Diagnosis not present

## 2014-09-17 DIAGNOSIS — N186 End stage renal disease: Secondary | ICD-10-CM | POA: Diagnosis not present

## 2014-09-18 ENCOUNTER — Encounter: Payer: Self-pay | Admitting: Internal Medicine

## 2014-09-18 ENCOUNTER — Ambulatory Visit (INDEPENDENT_AMBULATORY_CARE_PROVIDER_SITE_OTHER): Payer: Medicare Other | Admitting: Internal Medicine

## 2014-09-18 VITALS — BP 186/92 | HR 64 | Ht 66.0 in | Wt 216.2 lb

## 2014-09-18 DIAGNOSIS — R1013 Epigastric pain: Secondary | ICD-10-CM

## 2014-09-18 DIAGNOSIS — K625 Hemorrhage of anus and rectum: Secondary | ICD-10-CM

## 2014-09-18 DIAGNOSIS — K219 Gastro-esophageal reflux disease without esophagitis: Secondary | ICD-10-CM | POA: Diagnosis not present

## 2014-09-18 DIAGNOSIS — K648 Other hemorrhoids: Secondary | ICD-10-CM | POA: Diagnosis not present

## 2014-09-18 MED ORDER — OMEPRAZOLE 40 MG PO CPDR: 40.0000 mg | DELAYED_RELEASE_CAPSULE | Freq: Every day | ORAL | Status: DC

## 2014-09-18 NOTE — Progress Notes (Signed)
HISTORY OF PRESENT ILLNESS:  Tabitha Green is a 69 y.o. female with multiple medical problems as listed below including end-stage renal disease for which she is on dialysis 3 times weekly. The patient presents today, self-referred, regarding an isolated episode of minor rectal bleeding as well as ongoing epigastric discomfort. The patient reports developing minor rectal bleeding as manifested by small amounts of red blood in the toilet bowl and on the tissue with associated normal-appearing bowel movements. She denies any associated rectal pain or abdominal pain. She denies weight loss. She has alternating bowel habits with a tendency toward constipation. She has had no recurrent bleeding in 2 months. Her hemoglobin in 2014 was 11.1. She does have a history of colon polyps. Her last complete colonoscopy was performed October 2013. She was found to have severe pandiverticulosis without recurrent neoplasia. Internal hemorrhoids as well. The preparation was adequate. Next, she reports a several month history of epigastric gnawing discomfort. Problem is transiently improved with meals only to worsen thereafter. No associated nausea or vomiting. No dysphagia. She does have a history of GERD and underwent upper endoscopy in 2003 at which time she was found to have reflux esophagitis and a hiatal hernia. She had been on PPI, but none recently. Her chronic medical problems are stable  REVIEW OF SYSTEMS:  All non-GI ROS negative except for sinus and allergy, itching, skin rash, sleeping problems  Past Medical History  Diagnosis Date  . Hypertension   . Hypothyroidism   . Gout   . Arthritis   . Anemia     r/t kidney function- receives Procrit  . Cancer     kidney  . FSGS (focal segmental glomerulosclerosis)     right kidney;  . Osteopenia   . Diverticulosis     a. 05/2012 colonoscopy  . CKD (chronic kidney disease)     followed by Dr. [name].  . SVT (supraventricular tachycardia)   . GERD  (gastroesophageal reflux disease)   . Hiatal hernia     Past Surgical History  Procedure Laterality Date  . Total abdominal hysterectomy w/ bilateral salpingoophorectomy      fibroids  . Nephrectomy  1993    for malignancy- left  . Colonoscopy with polypectomy      Dr Henrene Pastor  . Av fistula placement      pt has not had to start dialysis  . Renal biopsy      right  . Colonoscopy    . Tee without cardioversion N/A 08/27/2013    Procedure: TRANSESOPHAGEAL ECHOCARDIOGRAM (TEE);  Surgeon: [name], MD;  Location: [facility] ENDOSCOPY;  Service: Cardiovascular;  Laterality: N/A;  . Vulva / perineum biopsy  2015    Social History ESME NEGRO  reports that she has never smoked. She has never used smokeless tobacco. She reports that she drinks alcohol. She reports that she does not use illicit drugs.  family history includes Cancer in her paternal aunt and sister; Deep vein thrombosis in her mother; Diabetes in her maternal grandfather; Heart attack (age of onset: 51) in her father; Hypertension in her father and mother; Stroke in her paternal grandmother. There is no history of Colon cancer, Esophageal cancer, Stomach cancer, or Rectal cancer.  Allergies  Allergen Reactions  . Cefaclor Rash  . Naproxen Rash  . Enalapril Maleate Cough  . Metoprolol Tartrate Rash    On legs       PHYSICAL EXAMINATION: Vital signs: BP 186/92 mmHg  Pulse 64  Ht 5\' 6"  (1.676  m)  Wt 216 lb 3.2 oz (98.068 kg)  BMI 34.91 kg/m2  Constitutional: Obese, pleasant but unhealthy-appearing, no acute distress Psychiatric: alert and oriented x3, cooperative Eyes: extraocular movements intact, anicteric, conjunctiva pink Mouth: oral pharynx moist, no lesions Neck: supple no lymphadenopathy Cardiovascular: heart regular rate and rhythm, no murmur Lungs: clear to auscultation bilaterally Abdomen: soft, obese, nontender, nondistended, no obvious ascites, no peritoneal signs, normal bowel sounds, no  organomegaly Rectal: Noninflamed external hemorrhoid. Soft brown Hemoccult negative stool without pain or mass Extremities: Trace lower extremity edema bilaterally Skin: no clinically relevant lesions on visible extremities Neuro: No focal deficits.   ASSESSMENT:  #1. Rectal bleeding. Minor and transient. Most certainly due to known hemorrhoids #2. Internal and external hemorrhoids. Transient bleeding likely from internal hemorrhoids. Previous colonoscopy October 2013 as described #3. Epigastric gnawing discomfort. Likely secondary to GERD. #4. Multiple medical problems   PLAN:  #1. Increase fiber supplementation #2. For recurrent bleeding could prescribe medicated suppositories. Advised patient #3. Prescribe omeprazole 40 mg daily #4. Reflux precautions reviewed #5. Return to the care of your primary providers. GI follow-up as needed

## 2014-09-18 NOTE — Patient Instructions (Signed)
We have sent the following medications to your pharmacy for you to pick up at your convenience:  Omeprazole  

## 2014-09-19 DIAGNOSIS — N2581 Secondary hyperparathyroidism of renal origin: Secondary | ICD-10-CM | POA: Diagnosis not present

## 2014-09-19 DIAGNOSIS — N186 End stage renal disease: Secondary | ICD-10-CM | POA: Diagnosis not present

## 2014-09-19 DIAGNOSIS — D689 Coagulation defect, unspecified: Secondary | ICD-10-CM | POA: Diagnosis not present

## 2014-09-19 DIAGNOSIS — D509 Iron deficiency anemia, unspecified: Secondary | ICD-10-CM | POA: Diagnosis not present

## 2014-09-22 DIAGNOSIS — D509 Iron deficiency anemia, unspecified: Secondary | ICD-10-CM | POA: Diagnosis not present

## 2014-09-22 DIAGNOSIS — N186 End stage renal disease: Secondary | ICD-10-CM | POA: Diagnosis not present

## 2014-09-22 DIAGNOSIS — N2581 Secondary hyperparathyroidism of renal origin: Secondary | ICD-10-CM | POA: Diagnosis not present

## 2014-09-22 DIAGNOSIS — D689 Coagulation defect, unspecified: Secondary | ICD-10-CM | POA: Diagnosis not present

## 2014-09-24 DIAGNOSIS — N186 End stage renal disease: Secondary | ICD-10-CM | POA: Diagnosis not present

## 2014-09-24 DIAGNOSIS — D689 Coagulation defect, unspecified: Secondary | ICD-10-CM | POA: Diagnosis not present

## 2014-09-24 DIAGNOSIS — D509 Iron deficiency anemia, unspecified: Secondary | ICD-10-CM | POA: Diagnosis not present

## 2014-09-24 DIAGNOSIS — N2581 Secondary hyperparathyroidism of renal origin: Secondary | ICD-10-CM | POA: Diagnosis not present

## 2014-09-26 DIAGNOSIS — D689 Coagulation defect, unspecified: Secondary | ICD-10-CM | POA: Diagnosis not present

## 2014-09-26 DIAGNOSIS — D509 Iron deficiency anemia, unspecified: Secondary | ICD-10-CM | POA: Diagnosis not present

## 2014-09-26 DIAGNOSIS — N186 End stage renal disease: Secondary | ICD-10-CM | POA: Diagnosis not present

## 2014-09-26 DIAGNOSIS — N2581 Secondary hyperparathyroidism of renal origin: Secondary | ICD-10-CM | POA: Diagnosis not present

## 2014-09-29 DIAGNOSIS — N186 End stage renal disease: Secondary | ICD-10-CM | POA: Diagnosis not present

## 2014-09-29 DIAGNOSIS — D509 Iron deficiency anemia, unspecified: Secondary | ICD-10-CM | POA: Diagnosis not present

## 2014-09-29 DIAGNOSIS — D689 Coagulation defect, unspecified: Secondary | ICD-10-CM | POA: Diagnosis not present

## 2014-09-29 DIAGNOSIS — N2581 Secondary hyperparathyroidism of renal origin: Secondary | ICD-10-CM | POA: Diagnosis not present

## 2014-10-01 DIAGNOSIS — N2581 Secondary hyperparathyroidism of renal origin: Secondary | ICD-10-CM | POA: Diagnosis not present

## 2014-10-01 DIAGNOSIS — D509 Iron deficiency anemia, unspecified: Secondary | ICD-10-CM | POA: Diagnosis not present

## 2014-10-01 DIAGNOSIS — D689 Coagulation defect, unspecified: Secondary | ICD-10-CM | POA: Diagnosis not present

## 2014-10-01 DIAGNOSIS — N186 End stage renal disease: Secondary | ICD-10-CM | POA: Diagnosis not present

## 2014-10-02 ENCOUNTER — Telehealth: Payer: Self-pay

## 2014-10-02 NOTE — Telephone Encounter (Signed)
Per Dr. Daniel Nones office, pt is scheduled for an appt. On March 2 at [facility] in [place].

## 2014-10-03 DIAGNOSIS — D689 Coagulation defect, unspecified: Secondary | ICD-10-CM | POA: Diagnosis not present

## 2014-10-03 DIAGNOSIS — N186 End stage renal disease: Secondary | ICD-10-CM | POA: Diagnosis not present

## 2014-10-06 DIAGNOSIS — D509 Iron deficiency anemia, unspecified: Secondary | ICD-10-CM | POA: Diagnosis not present

## 2014-10-06 DIAGNOSIS — N2581 Secondary hyperparathyroidism of renal origin: Secondary | ICD-10-CM | POA: Diagnosis not present

## 2014-10-06 DIAGNOSIS — Z992 Dependence on renal dialysis: Secondary | ICD-10-CM | POA: Diagnosis not present

## 2014-10-06 DIAGNOSIS — D689 Coagulation defect, unspecified: Secondary | ICD-10-CM | POA: Diagnosis not present

## 2014-10-06 DIAGNOSIS — N186 End stage renal disease: Secondary | ICD-10-CM | POA: Diagnosis not present

## 2014-10-07 DIAGNOSIS — D509 Iron deficiency anemia, unspecified: Secondary | ICD-10-CM | POA: Diagnosis not present

## 2014-10-07 DIAGNOSIS — N2581 Secondary hyperparathyroidism of renal origin: Secondary | ICD-10-CM | POA: Diagnosis not present

## 2014-10-07 DIAGNOSIS — D689 Coagulation defect, unspecified: Secondary | ICD-10-CM | POA: Diagnosis not present

## 2014-10-07 DIAGNOSIS — N186 End stage renal disease: Secondary | ICD-10-CM | POA: Diagnosis not present

## 2014-10-07 DIAGNOSIS — D631 Anemia in chronic kidney disease: Secondary | ICD-10-CM | POA: Diagnosis not present

## 2014-10-08 DIAGNOSIS — L820 Inflamed seborrheic keratosis: Secondary | ICD-10-CM | POA: Diagnosis not present

## 2014-10-08 DIAGNOSIS — L281 Prurigo nodularis: Secondary | ICD-10-CM | POA: Diagnosis not present

## 2014-10-10 DIAGNOSIS — N9089 Other specified noninflammatory disorders of vulva and perineum: Secondary | ICD-10-CM | POA: Diagnosis not present

## 2014-10-10 DIAGNOSIS — D509 Iron deficiency anemia, unspecified: Secondary | ICD-10-CM | POA: Diagnosis not present

## 2014-10-10 DIAGNOSIS — D689 Coagulation defect, unspecified: Secondary | ICD-10-CM | POA: Diagnosis not present

## 2014-10-10 DIAGNOSIS — D631 Anemia in chronic kidney disease: Secondary | ICD-10-CM | POA: Diagnosis not present

## 2014-10-10 DIAGNOSIS — N186 End stage renal disease: Secondary | ICD-10-CM | POA: Diagnosis not present

## 2014-10-10 DIAGNOSIS — N2581 Secondary hyperparathyroidism of renal origin: Secondary | ICD-10-CM | POA: Diagnosis not present

## 2014-10-13 DIAGNOSIS — N186 End stage renal disease: Secondary | ICD-10-CM | POA: Diagnosis not present

## 2014-10-13 DIAGNOSIS — D509 Iron deficiency anemia, unspecified: Secondary | ICD-10-CM | POA: Diagnosis not present

## 2014-10-13 DIAGNOSIS — D689 Coagulation defect, unspecified: Secondary | ICD-10-CM | POA: Diagnosis not present

## 2014-10-13 DIAGNOSIS — N2581 Secondary hyperparathyroidism of renal origin: Secondary | ICD-10-CM | POA: Diagnosis not present

## 2014-10-13 DIAGNOSIS — D631 Anemia in chronic kidney disease: Secondary | ICD-10-CM | POA: Diagnosis not present

## 2014-10-15 DIAGNOSIS — D509 Iron deficiency anemia, unspecified: Secondary | ICD-10-CM | POA: Diagnosis not present

## 2014-10-15 DIAGNOSIS — N186 End stage renal disease: Secondary | ICD-10-CM | POA: Diagnosis not present

## 2014-10-15 DIAGNOSIS — D689 Coagulation defect, unspecified: Secondary | ICD-10-CM | POA: Diagnosis not present

## 2014-10-15 DIAGNOSIS — D631 Anemia in chronic kidney disease: Secondary | ICD-10-CM | POA: Diagnosis not present

## 2014-10-15 DIAGNOSIS — N2581 Secondary hyperparathyroidism of renal origin: Secondary | ICD-10-CM | POA: Diagnosis not present

## 2014-10-17 DIAGNOSIS — D509 Iron deficiency anemia, unspecified: Secondary | ICD-10-CM | POA: Diagnosis not present

## 2014-10-17 DIAGNOSIS — D689 Coagulation defect, unspecified: Secondary | ICD-10-CM | POA: Diagnosis not present

## 2014-10-17 DIAGNOSIS — D631 Anemia in chronic kidney disease: Secondary | ICD-10-CM | POA: Diagnosis not present

## 2014-10-17 DIAGNOSIS — N186 End stage renal disease: Secondary | ICD-10-CM | POA: Diagnosis not present

## 2014-10-17 DIAGNOSIS — N2581 Secondary hyperparathyroidism of renal origin: Secondary | ICD-10-CM | POA: Diagnosis not present

## 2014-10-20 DIAGNOSIS — N186 End stage renal disease: Secondary | ICD-10-CM | POA: Diagnosis not present

## 2014-10-20 DIAGNOSIS — D509 Iron deficiency anemia, unspecified: Secondary | ICD-10-CM | POA: Diagnosis not present

## 2014-10-20 DIAGNOSIS — D631 Anemia in chronic kidney disease: Secondary | ICD-10-CM | POA: Diagnosis not present

## 2014-10-20 DIAGNOSIS — N2581 Secondary hyperparathyroidism of renal origin: Secondary | ICD-10-CM | POA: Diagnosis not present

## 2014-10-20 DIAGNOSIS — D689 Coagulation defect, unspecified: Secondary | ICD-10-CM | POA: Diagnosis not present

## 2014-10-22 DIAGNOSIS — N186 End stage renal disease: Secondary | ICD-10-CM | POA: Diagnosis not present

## 2014-10-22 DIAGNOSIS — D631 Anemia in chronic kidney disease: Secondary | ICD-10-CM | POA: Diagnosis not present

## 2014-10-22 DIAGNOSIS — N2581 Secondary hyperparathyroidism of renal origin: Secondary | ICD-10-CM | POA: Diagnosis not present

## 2014-10-22 DIAGNOSIS — D509 Iron deficiency anemia, unspecified: Secondary | ICD-10-CM | POA: Diagnosis not present

## 2014-10-22 DIAGNOSIS — D689 Coagulation defect, unspecified: Secondary | ICD-10-CM | POA: Diagnosis not present

## 2014-10-24 DIAGNOSIS — N186 End stage renal disease: Secondary | ICD-10-CM | POA: Diagnosis not present

## 2014-10-24 DIAGNOSIS — D509 Iron deficiency anemia, unspecified: Secondary | ICD-10-CM | POA: Diagnosis not present

## 2014-10-24 DIAGNOSIS — D631 Anemia in chronic kidney disease: Secondary | ICD-10-CM | POA: Diagnosis not present

## 2014-10-24 DIAGNOSIS — D689 Coagulation defect, unspecified: Secondary | ICD-10-CM | POA: Diagnosis not present

## 2014-10-24 DIAGNOSIS — N2581 Secondary hyperparathyroidism of renal origin: Secondary | ICD-10-CM | POA: Diagnosis not present

## 2014-10-27 DIAGNOSIS — N186 End stage renal disease: Secondary | ICD-10-CM | POA: Diagnosis not present

## 2014-10-27 DIAGNOSIS — D631 Anemia in chronic kidney disease: Secondary | ICD-10-CM | POA: Diagnosis not present

## 2014-10-27 DIAGNOSIS — N2581 Secondary hyperparathyroidism of renal origin: Secondary | ICD-10-CM | POA: Diagnosis not present

## 2014-10-27 DIAGNOSIS — D689 Coagulation defect, unspecified: Secondary | ICD-10-CM | POA: Diagnosis not present

## 2014-10-27 DIAGNOSIS — D509 Iron deficiency anemia, unspecified: Secondary | ICD-10-CM | POA: Diagnosis not present

## 2014-10-28 ENCOUNTER — Emergency Department
Admission: EM | Admit: 2014-10-28 | Discharge: 2014-10-28 | Disposition: A | Discharge status: Home / Self Care | Payer: Medicare Other | Source: Home / Self Care

## 2014-10-28 ENCOUNTER — Encounter: Payer: Self-pay | Admitting: *Deleted

## 2014-10-28 DIAGNOSIS — J0100 Acute maxillary sinusitis, unspecified: Secondary | ICD-10-CM

## 2014-10-28 DIAGNOSIS — D631 Anemia in chronic kidney disease: Secondary | ICD-10-CM | POA: Diagnosis not present

## 2014-10-28 DIAGNOSIS — D689 Coagulation defect, unspecified: Secondary | ICD-10-CM | POA: Diagnosis not present

## 2014-10-28 DIAGNOSIS — N2581 Secondary hyperparathyroidism of renal origin: Secondary | ICD-10-CM | POA: Diagnosis not present

## 2014-10-28 DIAGNOSIS — H65192 Other acute nonsuppurative otitis media, left ear: Secondary | ICD-10-CM | POA: Diagnosis not present

## 2014-10-28 DIAGNOSIS — N186 End stage renal disease: Secondary | ICD-10-CM | POA: Diagnosis not present

## 2014-10-28 DIAGNOSIS — D509 Iron deficiency anemia, unspecified: Secondary | ICD-10-CM | POA: Diagnosis not present

## 2014-10-28 MED ORDER — AMOXICILLIN 875 MG PO TABS: ORAL_TABLET | ORAL | Status: DC

## 2014-10-28 NOTE — ED Provider Notes (Signed)
CSN: [id number]     Arrival date & time 10/28/14  1206 History   None    Chief Complaint  Patient presents with  . Sore Throat  . Otalgia  . Nasal Congestion   (Consider location/radiation/quality/duration/timing/severity/associated sxs/prior Treatment) HPI URI HISTORY  Tabitha Green is a 69 y.o. female who complains of onset of cold symptoms for several days.  Have been using over-the-counter treatment which helps a little bit.  No chills/sweats Possible low-grade Fever  +  Nasal congestion +  Discolored Post-nasal drainage Mild sinus pain/pressure Mild sore throat  Minimal, nonproductive cough No wheezing No chest congestion No hemoptysis No shortness of breath No pleuritic pain  No itchy/red eyes Positive left earache  No nausea No vomiting No abdominal pain No diarrhea  No skin rashes +  Fatigue No myalgias No headache   Past Medical History  Diagnosis Date  . Hypertension   . Hypothyroidism   . Gout   . Arthritis   . Anemia     r/t kidney function- receives Procrit  . Cancer     kidney  . FSGS (focal segmental glomerulosclerosis)     right kidney;  . Osteopenia   . Diverticulosis     a. 05/2012 colonoscopy  . CKD (chronic kidney disease)     followed by Dr. [name].  . SVT (supraventricular tachycardia)   . GERD (gastroesophageal reflux disease)   . Hiatal hernia    Past Surgical History  Procedure Laterality Date  . Total abdominal hysterectomy w/ bilateral salpingoophorectomy      fibroids  . Nephrectomy  1993    for malignancy- left  . Colonoscopy with polypectomy      Dr Henrene Pastor  . Av fistula placement      pt has not had to start dialysis  . Renal biopsy      right  . Colonoscopy    . Tee without cardioversion N/A 08/27/2013    Procedure: TRANSESOPHAGEAL ECHOCARDIOGRAM (TEE);  Surgeon: [name], MD;  Location: [facility] ENDOSCOPY;  Service: Cardiovascular;  Laterality: N/A;  . Vulva / perineum biopsy  2015   Family History  Problem  Relation Age of Onset  . Deep vein thrombosis Mother     post thyroid surgery  . Hypertension Mother   . Heart attack Father 45    deceased  . Hypertension Father   . Cancer Sister     breast  . Cancer Paternal Aunt     pancreatic  . Diabetes Maternal Grandfather   . Stroke Paternal Grandmother     in 27s  . Colon cancer Neg Hx   . Esophageal cancer Neg Hx   . Stomach cancer Neg Hx   . Rectal cancer Neg Hx    History  Substance Use Topics  . Smoking status: Never Smoker   . Smokeless tobacco: Never Used  . Alcohol Use: Yes     Comment: rarely   OB History    Gravida Para Term Preterm AB TAB SAB Ectopic Multiple Living   2 2 2  0 0 0 0 0 0 2     Review of Systems  All other systems reviewed and are negative.   Allergies  Cefaclor; Naproxen; Enalapril maleate; and Metoprolol tartrate  Home Medications   Prior to Admission medications   Medication Sig Start Date End Date Taking? Authorizing Provider  acetaminophen (TYLENOL) 500 MG tablet Take 500-750 mg by mouth every 6 (six) hours as needed for pain.    Historical Provider, MD  amoxicillin (AMOXIL) 875 MG tablet Take 1 twice a day X 10 days. 10/28/14   Jacqulyn Cane, MD  aspirin EC 81 MG EC tablet Take 1 tablet (81 mg total) by mouth daily. 12/09/12   Brooke O Edmisten, PA-C  AYR SALINE NASAL DROPS NA Place 2 sprays into both nostrils daily.    Historical Provider, MD  CALCIUM CARBONATE PO Take 2 tablets by mouth 3 (three) times daily.    Historical Provider, MD  Camphor-Eucalyptus-Menthol (VICKS VAPORUB EX) Apply 1 application topically daily.    Historical Provider, MD  clobetasol ointment (TEMOVATE) AB-123456789 % Apply 1 application topically 2 (two) times daily. Apply to affected area for two weeks then once a day for 2 weeks. 06/22/14   Guss Bunde, MD  desonide (DESOWEN) 0.05 % cream  03/26/14   Historical Provider, MD  econazole nitrate 1 % cream  03/26/14   Historical Provider, MD  fluticasone (FLONASE) 50 MCG/ACT nasal  spray Place 1 spray into the nose 2 (two) times daily as needed for rhinitis. 05/16/13   Hendricks Limes, MD  ketoconazole (NIZORAL) 2 % cream Apply 1 application topically 2 (two) times daily. 06/10/14   Hendricks Limes, MD  levothyroxine Wilmer Floor, LEVOTHROID) 175 MCG tablet Take 1 tablet daily except Tuesday and Thursday take 1.5 tablets 06/13/13   Hendricks Limes, MD  loratadine (CLARITIN) 10 MG tablet Take 20 mg by mouth as needed for allergies.    Historical Provider, MD  multivitamin (RENA-VIT) TABS tablet take 1 tablet by mouth daily 07/28/14   Hendricks Limes, MD  nystatin (MYCOSTATIN/NYSTOP) 100000 UNIT/GM POWD Apply 1 g topically 2 (two) times daily. 06/05/14   Guss Bunde, MD  omeprazole (PRILOSEC) 40 MG capsule Take 1 capsule (40 mg total) by mouth daily. 09/18/14   Irene Shipper, MD  propranolol (INDERAL) 40 MG tablet Take 1 / 2 tablet once daily on Monday, Wednesday, and Friday (dialysis day)  and take 1/ 2 tablet  twice a day on Sunday, Tuesday, Thursday, and Saturday 06/12/13   Hendricks Limes, MD  triamcinolone cream (KENALOG) 0.1 % Apply 1 application topically 2 (two) times daily. 06/05/14   Guss Bunde, MD  verapamil (CALAN-SR) 120 MG CR tablet Take 120 mg by mouth as directed. 02/14/14   Hendricks Limes, MD   BP 131/84 mmHg  Pulse 87  Temp(Src) 98.4 F (36.9 C) (Oral)  Resp 18  Ht 5' 6.5" (1.689 m)  Wt 213 lb (96.616 kg)  BMI 33.87 kg/m2  SpO2 97% Physical Exam  Constitutional: She is oriented to person, place, and time. She appears well-developed and well-nourished. No distress.  HENT:  Head: Normocephalic and atraumatic.  Right Ear: Tympanic membrane, external ear and ear canal normal.  Left Ear: External ear and ear canal normal.  Nose: Mucosal edema and rhinorrhea present. Right sinus exhibits maxillary sinus tenderness. Left sinus exhibits maxillary sinus tenderness.  Mouth/Throat: Oropharynx is clear and moist. No oral lesions. No oropharyngeal exudate.   Left TM moderately red, not distorted. No perforation or drainage  Eyes: Conjunctivae are normal. Right eye exhibits no discharge. Left eye exhibits no discharge. No scleral icterus.  Neck: Neck supple.  Cardiovascular: Normal rate, regular rhythm and normal heart sounds.   Pulmonary/Chest: Effort normal and breath sounds normal. She has no wheezes. She has no rales.  Lymphadenopathy:    She has no cervical adenopathy.  Neurological: She is alert and oriented to person, place, and time.  Skin: Skin  is warm and dry.  Nursing note and vitals reviewed.   ED Course  Procedures (including critical care time) Labs Review Labs Reviewed - No data to display  Imaging Review No results found.   MDM   1. Acute maxillary sinusitis, recurrence not specified   2. Acute nonsuppurative otitis media of left ear    Treatment options discussed, as well as risks, benefits, alternatives. Patient voiced understanding and agreement with the following plans:  Amoxicillin 875 twice a day 10 days Other symptomatic care discussed. Coricidin BP for decongestant Continue Nasacort Follow-up with your primary care doctor in 5-7 days if not improving, or sooner if symptoms become worse. Precautions discussed. Red flags discussed. Questions invited and answered. Patient voiced understanding and agreement.    Jacqulyn Cane, MD 10/28/14 1430

## 2014-10-28 NOTE — ED Notes (Signed)
Pt c/o LT ear ache, sore throat, and nasal congestion x 5-6 days. Denies fever. She has a biopsy of her Vulva sch'ed for 10/29/14. She does dialysis Monday, Wednesday, and Friday. She did dialysis today since she is having her biopsy tomorrow.

## 2014-10-29 DIAGNOSIS — N952 Postmenopausal atrophic vaginitis: Secondary | ICD-10-CM | POA: Diagnosis not present

## 2014-10-29 DIAGNOSIS — N909 Noninflammatory disorder of vulva and perineum, unspecified: Secondary | ICD-10-CM | POA: Diagnosis not present

## 2014-10-29 DIAGNOSIS — D485 Neoplasm of uncertain behavior of skin: Secondary | ICD-10-CM | POA: Diagnosis not present

## 2014-10-31 DIAGNOSIS — N186 End stage renal disease: Secondary | ICD-10-CM | POA: Diagnosis not present

## 2014-10-31 DIAGNOSIS — D689 Coagulation defect, unspecified: Secondary | ICD-10-CM | POA: Diagnosis not present

## 2014-10-31 DIAGNOSIS — N2581 Secondary hyperparathyroidism of renal origin: Secondary | ICD-10-CM | POA: Diagnosis not present

## 2014-10-31 DIAGNOSIS — D509 Iron deficiency anemia, unspecified: Secondary | ICD-10-CM | POA: Diagnosis not present

## 2014-10-31 DIAGNOSIS — D631 Anemia in chronic kidney disease: Secondary | ICD-10-CM | POA: Diagnosis not present

## 2014-11-03 DIAGNOSIS — N186 End stage renal disease: Secondary | ICD-10-CM | POA: Diagnosis not present

## 2014-11-03 DIAGNOSIS — D631 Anemia in chronic kidney disease: Secondary | ICD-10-CM | POA: Diagnosis not present

## 2014-11-03 DIAGNOSIS — D509 Iron deficiency anemia, unspecified: Secondary | ICD-10-CM | POA: Diagnosis not present

## 2014-11-03 DIAGNOSIS — D689 Coagulation defect, unspecified: Secondary | ICD-10-CM | POA: Diagnosis not present

## 2014-11-03 DIAGNOSIS — N2581 Secondary hyperparathyroidism of renal origin: Secondary | ICD-10-CM | POA: Diagnosis not present

## 2014-11-05 DIAGNOSIS — D631 Anemia in chronic kidney disease: Secondary | ICD-10-CM | POA: Diagnosis not present

## 2014-11-05 DIAGNOSIS — D509 Iron deficiency anemia, unspecified: Secondary | ICD-10-CM | POA: Diagnosis not present

## 2014-11-05 DIAGNOSIS — N2581 Secondary hyperparathyroidism of renal origin: Secondary | ICD-10-CM | POA: Diagnosis not present

## 2014-11-05 DIAGNOSIS — N186 End stage renal disease: Secondary | ICD-10-CM | POA: Diagnosis not present

## 2014-11-05 DIAGNOSIS — D689 Coagulation defect, unspecified: Secondary | ICD-10-CM | POA: Diagnosis not present

## 2014-11-06 DIAGNOSIS — Z992 Dependence on renal dialysis: Secondary | ICD-10-CM | POA: Diagnosis not present

## 2014-11-06 DIAGNOSIS — N033 Chronic nephritic syndrome with diffuse mesangial proliferative glomerulonephritis: Secondary | ICD-10-CM | POA: Diagnosis not present

## 2014-11-06 DIAGNOSIS — N186 End stage renal disease: Secondary | ICD-10-CM | POA: Diagnosis not present

## 2014-11-07 DIAGNOSIS — D689 Coagulation defect, unspecified: Secondary | ICD-10-CM | POA: Diagnosis not present

## 2014-11-07 DIAGNOSIS — N2581 Secondary hyperparathyroidism of renal origin: Secondary | ICD-10-CM | POA: Diagnosis not present

## 2014-11-07 DIAGNOSIS — D509 Iron deficiency anemia, unspecified: Secondary | ICD-10-CM | POA: Diagnosis not present

## 2014-11-07 DIAGNOSIS — N186 End stage renal disease: Secondary | ICD-10-CM | POA: Diagnosis not present

## 2014-11-07 DIAGNOSIS — D631 Anemia in chronic kidney disease: Secondary | ICD-10-CM | POA: Diagnosis not present

## 2014-11-10 DIAGNOSIS — D509 Iron deficiency anemia, unspecified: Secondary | ICD-10-CM | POA: Diagnosis not present

## 2014-11-10 DIAGNOSIS — D631 Anemia in chronic kidney disease: Secondary | ICD-10-CM | POA: Diagnosis not present

## 2014-11-10 DIAGNOSIS — N2581 Secondary hyperparathyroidism of renal origin: Secondary | ICD-10-CM | POA: Diagnosis not present

## 2014-11-10 DIAGNOSIS — N186 End stage renal disease: Secondary | ICD-10-CM | POA: Diagnosis not present

## 2014-11-10 DIAGNOSIS — D689 Coagulation defect, unspecified: Secondary | ICD-10-CM | POA: Diagnosis not present

## 2014-11-11 ENCOUNTER — Other Ambulatory Visit: Payer: Self-pay | Admitting: Internal Medicine

## 2014-11-12 DIAGNOSIS — D689 Coagulation defect, unspecified: Secondary | ICD-10-CM | POA: Diagnosis not present

## 2014-11-12 DIAGNOSIS — D631 Anemia in chronic kidney disease: Secondary | ICD-10-CM | POA: Diagnosis not present

## 2014-11-12 DIAGNOSIS — N2581 Secondary hyperparathyroidism of renal origin: Secondary | ICD-10-CM | POA: Diagnosis not present

## 2014-11-12 DIAGNOSIS — N186 End stage renal disease: Secondary | ICD-10-CM | POA: Diagnosis not present

## 2014-11-12 DIAGNOSIS — D509 Iron deficiency anemia, unspecified: Secondary | ICD-10-CM | POA: Diagnosis not present

## 2014-11-14 DIAGNOSIS — N2581 Secondary hyperparathyroidism of renal origin: Secondary | ICD-10-CM | POA: Diagnosis not present

## 2014-11-14 DIAGNOSIS — D689 Coagulation defect, unspecified: Secondary | ICD-10-CM | POA: Diagnosis not present

## 2014-11-14 DIAGNOSIS — N186 End stage renal disease: Secondary | ICD-10-CM | POA: Diagnosis not present

## 2014-11-14 DIAGNOSIS — D509 Iron deficiency anemia, unspecified: Secondary | ICD-10-CM | POA: Diagnosis not present

## 2014-11-14 DIAGNOSIS — D631 Anemia in chronic kidney disease: Secondary | ICD-10-CM | POA: Diagnosis not present

## 2014-11-17 DIAGNOSIS — N186 End stage renal disease: Secondary | ICD-10-CM | POA: Diagnosis not present

## 2014-11-17 DIAGNOSIS — D689 Coagulation defect, unspecified: Secondary | ICD-10-CM | POA: Diagnosis not present

## 2014-11-17 DIAGNOSIS — D631 Anemia in chronic kidney disease: Secondary | ICD-10-CM | POA: Diagnosis not present

## 2014-11-17 DIAGNOSIS — N2581 Secondary hyperparathyroidism of renal origin: Secondary | ICD-10-CM | POA: Diagnosis not present

## 2014-11-17 DIAGNOSIS — D509 Iron deficiency anemia, unspecified: Secondary | ICD-10-CM | POA: Diagnosis not present

## 2014-11-19 DIAGNOSIS — D689 Coagulation defect, unspecified: Secondary | ICD-10-CM | POA: Diagnosis not present

## 2014-11-19 DIAGNOSIS — D631 Anemia in chronic kidney disease: Secondary | ICD-10-CM | POA: Diagnosis not present

## 2014-11-19 DIAGNOSIS — D509 Iron deficiency anemia, unspecified: Secondary | ICD-10-CM | POA: Diagnosis not present

## 2014-11-19 DIAGNOSIS — N2581 Secondary hyperparathyroidism of renal origin: Secondary | ICD-10-CM | POA: Diagnosis not present

## 2014-11-19 DIAGNOSIS — N186 End stage renal disease: Secondary | ICD-10-CM | POA: Diagnosis not present

## 2014-11-21 DIAGNOSIS — D631 Anemia in chronic kidney disease: Secondary | ICD-10-CM | POA: Diagnosis not present

## 2014-11-21 DIAGNOSIS — D689 Coagulation defect, unspecified: Secondary | ICD-10-CM | POA: Diagnosis not present

## 2014-11-21 DIAGNOSIS — N186 End stage renal disease: Secondary | ICD-10-CM | POA: Diagnosis not present

## 2014-11-21 DIAGNOSIS — D509 Iron deficiency anemia, unspecified: Secondary | ICD-10-CM | POA: Diagnosis not present

## 2014-11-21 DIAGNOSIS — N2581 Secondary hyperparathyroidism of renal origin: Secondary | ICD-10-CM | POA: Diagnosis not present

## 2014-11-24 DIAGNOSIS — N2581 Secondary hyperparathyroidism of renal origin: Secondary | ICD-10-CM | POA: Diagnosis not present

## 2014-11-24 DIAGNOSIS — D509 Iron deficiency anemia, unspecified: Secondary | ICD-10-CM | POA: Diagnosis not present

## 2014-11-24 DIAGNOSIS — D631 Anemia in chronic kidney disease: Secondary | ICD-10-CM | POA: Diagnosis not present

## 2014-11-24 DIAGNOSIS — D689 Coagulation defect, unspecified: Secondary | ICD-10-CM | POA: Diagnosis not present

## 2014-11-24 DIAGNOSIS — N186 End stage renal disease: Secondary | ICD-10-CM | POA: Diagnosis not present

## 2014-11-26 DIAGNOSIS — D689 Coagulation defect, unspecified: Secondary | ICD-10-CM | POA: Diagnosis not present

## 2014-11-26 DIAGNOSIS — N186 End stage renal disease: Secondary | ICD-10-CM | POA: Diagnosis not present

## 2014-11-26 DIAGNOSIS — D631 Anemia in chronic kidney disease: Secondary | ICD-10-CM | POA: Diagnosis not present

## 2014-11-26 DIAGNOSIS — N2581 Secondary hyperparathyroidism of renal origin: Secondary | ICD-10-CM | POA: Diagnosis not present

## 2014-11-26 DIAGNOSIS — D509 Iron deficiency anemia, unspecified: Secondary | ICD-10-CM | POA: Diagnosis not present

## 2014-11-28 DIAGNOSIS — D689 Coagulation defect, unspecified: Secondary | ICD-10-CM | POA: Diagnosis not present

## 2014-11-28 DIAGNOSIS — D631 Anemia in chronic kidney disease: Secondary | ICD-10-CM | POA: Diagnosis not present

## 2014-11-28 DIAGNOSIS — N2581 Secondary hyperparathyroidism of renal origin: Secondary | ICD-10-CM | POA: Diagnosis not present

## 2014-11-28 DIAGNOSIS — N186 End stage renal disease: Secondary | ICD-10-CM | POA: Diagnosis not present

## 2014-11-28 DIAGNOSIS — D509 Iron deficiency anemia, unspecified: Secondary | ICD-10-CM | POA: Diagnosis not present

## 2014-12-01 DIAGNOSIS — D509 Iron deficiency anemia, unspecified: Secondary | ICD-10-CM | POA: Diagnosis not present

## 2014-12-01 DIAGNOSIS — N186 End stage renal disease: Secondary | ICD-10-CM | POA: Diagnosis not present

## 2014-12-01 DIAGNOSIS — D631 Anemia in chronic kidney disease: Secondary | ICD-10-CM | POA: Diagnosis not present

## 2014-12-01 DIAGNOSIS — D689 Coagulation defect, unspecified: Secondary | ICD-10-CM | POA: Diagnosis not present

## 2014-12-01 DIAGNOSIS — N2581 Secondary hyperparathyroidism of renal origin: Secondary | ICD-10-CM | POA: Diagnosis not present

## 2014-12-02 DIAGNOSIS — I10 Essential (primary) hypertension: Secondary | ICD-10-CM | POA: Diagnosis not present

## 2014-12-02 DIAGNOSIS — N186 End stage renal disease: Secondary | ICD-10-CM | POA: Diagnosis not present

## 2014-12-02 DIAGNOSIS — I471 Supraventricular tachycardia: Secondary | ICD-10-CM | POA: Diagnosis not present

## 2014-12-02 DIAGNOSIS — Z01818 Encounter for other preprocedural examination: Secondary | ICD-10-CM | POA: Diagnosis not present

## 2014-12-03 DIAGNOSIS — D509 Iron deficiency anemia, unspecified: Secondary | ICD-10-CM | POA: Diagnosis not present

## 2014-12-03 DIAGNOSIS — D689 Coagulation defect, unspecified: Secondary | ICD-10-CM | POA: Diagnosis not present

## 2014-12-03 DIAGNOSIS — D631 Anemia in chronic kidney disease: Secondary | ICD-10-CM | POA: Diagnosis not present

## 2014-12-03 DIAGNOSIS — N186 End stage renal disease: Secondary | ICD-10-CM | POA: Diagnosis not present

## 2014-12-03 DIAGNOSIS — N2581 Secondary hyperparathyroidism of renal origin: Secondary | ICD-10-CM | POA: Diagnosis not present

## 2014-12-05 DIAGNOSIS — D689 Coagulation defect, unspecified: Secondary | ICD-10-CM | POA: Diagnosis not present

## 2014-12-05 DIAGNOSIS — D509 Iron deficiency anemia, unspecified: Secondary | ICD-10-CM | POA: Diagnosis not present

## 2014-12-05 DIAGNOSIS — N186 End stage renal disease: Secondary | ICD-10-CM | POA: Diagnosis not present

## 2014-12-05 DIAGNOSIS — D631 Anemia in chronic kidney disease: Secondary | ICD-10-CM | POA: Diagnosis not present

## 2014-12-05 DIAGNOSIS — N2581 Secondary hyperparathyroidism of renal origin: Secondary | ICD-10-CM | POA: Diagnosis not present

## 2014-12-06 DIAGNOSIS — N033 Chronic nephritic syndrome with diffuse mesangial proliferative glomerulonephritis: Secondary | ICD-10-CM | POA: Diagnosis not present

## 2014-12-06 DIAGNOSIS — N186 End stage renal disease: Secondary | ICD-10-CM | POA: Diagnosis not present

## 2014-12-06 DIAGNOSIS — Z992 Dependence on renal dialysis: Secondary | ICD-10-CM | POA: Diagnosis not present

## 2014-12-08 DIAGNOSIS — N186 End stage renal disease: Secondary | ICD-10-CM | POA: Diagnosis not present

## 2014-12-08 DIAGNOSIS — D631 Anemia in chronic kidney disease: Secondary | ICD-10-CM | POA: Diagnosis not present

## 2014-12-08 DIAGNOSIS — N2581 Secondary hyperparathyroidism of renal origin: Secondary | ICD-10-CM | POA: Diagnosis not present

## 2014-12-08 DIAGNOSIS — D689 Coagulation defect, unspecified: Secondary | ICD-10-CM | POA: Diagnosis not present

## 2014-12-08 DIAGNOSIS — D509 Iron deficiency anemia, unspecified: Secondary | ICD-10-CM | POA: Diagnosis not present

## 2014-12-10 DIAGNOSIS — D509 Iron deficiency anemia, unspecified: Secondary | ICD-10-CM | POA: Diagnosis not present

## 2014-12-10 DIAGNOSIS — N186 End stage renal disease: Secondary | ICD-10-CM | POA: Diagnosis not present

## 2014-12-10 DIAGNOSIS — D689 Coagulation defect, unspecified: Secondary | ICD-10-CM | POA: Diagnosis not present

## 2014-12-10 DIAGNOSIS — N2581 Secondary hyperparathyroidism of renal origin: Secondary | ICD-10-CM | POA: Diagnosis not present

## 2014-12-10 DIAGNOSIS — D631 Anemia in chronic kidney disease: Secondary | ICD-10-CM | POA: Diagnosis not present

## 2014-12-12 DIAGNOSIS — N186 End stage renal disease: Secondary | ICD-10-CM | POA: Diagnosis not present

## 2014-12-12 DIAGNOSIS — D509 Iron deficiency anemia, unspecified: Secondary | ICD-10-CM | POA: Diagnosis not present

## 2014-12-12 DIAGNOSIS — N2581 Secondary hyperparathyroidism of renal origin: Secondary | ICD-10-CM | POA: Diagnosis not present

## 2014-12-12 DIAGNOSIS — D631 Anemia in chronic kidney disease: Secondary | ICD-10-CM | POA: Diagnosis not present

## 2014-12-12 DIAGNOSIS — D689 Coagulation defect, unspecified: Secondary | ICD-10-CM | POA: Diagnosis not present

## 2014-12-15 ENCOUNTER — Other Ambulatory Visit: Payer: Self-pay

## 2014-12-15 DIAGNOSIS — N186 End stage renal disease: Secondary | ICD-10-CM | POA: Diagnosis not present

## 2014-12-15 DIAGNOSIS — D509 Iron deficiency anemia, unspecified: Secondary | ICD-10-CM | POA: Diagnosis not present

## 2014-12-15 DIAGNOSIS — D689 Coagulation defect, unspecified: Secondary | ICD-10-CM | POA: Diagnosis not present

## 2014-12-15 DIAGNOSIS — D631 Anemia in chronic kidney disease: Secondary | ICD-10-CM | POA: Diagnosis not present

## 2014-12-15 DIAGNOSIS — N2581 Secondary hyperparathyroidism of renal origin: Secondary | ICD-10-CM | POA: Diagnosis not present

## 2014-12-15 MED ORDER — VERAPAMIL HCL ER 120 MG PO TBCR: 120.0000 mg | EXTENDED_RELEASE_TABLET | Freq: Every day | ORAL | Status: DC

## 2014-12-17 DIAGNOSIS — N186 End stage renal disease: Secondary | ICD-10-CM | POA: Diagnosis not present

## 2014-12-17 DIAGNOSIS — D689 Coagulation defect, unspecified: Secondary | ICD-10-CM | POA: Diagnosis not present

## 2014-12-17 DIAGNOSIS — D509 Iron deficiency anemia, unspecified: Secondary | ICD-10-CM | POA: Diagnosis not present

## 2014-12-17 DIAGNOSIS — D631 Anemia in chronic kidney disease: Secondary | ICD-10-CM | POA: Diagnosis not present

## 2014-12-17 DIAGNOSIS — N2581 Secondary hyperparathyroidism of renal origin: Secondary | ICD-10-CM | POA: Diagnosis not present

## 2014-12-19 DIAGNOSIS — D631 Anemia in chronic kidney disease: Secondary | ICD-10-CM | POA: Diagnosis not present

## 2014-12-19 DIAGNOSIS — N186 End stage renal disease: Secondary | ICD-10-CM | POA: Diagnosis not present

## 2014-12-19 DIAGNOSIS — D509 Iron deficiency anemia, unspecified: Secondary | ICD-10-CM | POA: Diagnosis not present

## 2014-12-19 DIAGNOSIS — N2581 Secondary hyperparathyroidism of renal origin: Secondary | ICD-10-CM | POA: Diagnosis not present

## 2014-12-19 DIAGNOSIS — D689 Coagulation defect, unspecified: Secondary | ICD-10-CM | POA: Diagnosis not present

## 2014-12-22 DIAGNOSIS — D509 Iron deficiency anemia, unspecified: Secondary | ICD-10-CM | POA: Diagnosis not present

## 2014-12-22 DIAGNOSIS — N186 End stage renal disease: Secondary | ICD-10-CM | POA: Diagnosis not present

## 2014-12-22 DIAGNOSIS — N2581 Secondary hyperparathyroidism of renal origin: Secondary | ICD-10-CM | POA: Diagnosis not present

## 2014-12-22 DIAGNOSIS — D631 Anemia in chronic kidney disease: Secondary | ICD-10-CM | POA: Diagnosis not present

## 2014-12-22 DIAGNOSIS — D689 Coagulation defect, unspecified: Secondary | ICD-10-CM | POA: Diagnosis not present

## 2014-12-24 DIAGNOSIS — N2581 Secondary hyperparathyroidism of renal origin: Secondary | ICD-10-CM | POA: Diagnosis not present

## 2014-12-24 DIAGNOSIS — N186 End stage renal disease: Secondary | ICD-10-CM | POA: Diagnosis not present

## 2014-12-24 DIAGNOSIS — D689 Coagulation defect, unspecified: Secondary | ICD-10-CM | POA: Diagnosis not present

## 2014-12-24 DIAGNOSIS — D631 Anemia in chronic kidney disease: Secondary | ICD-10-CM | POA: Diagnosis not present

## 2014-12-24 DIAGNOSIS — D509 Iron deficiency anemia, unspecified: Secondary | ICD-10-CM | POA: Diagnosis not present

## 2014-12-26 DIAGNOSIS — D689 Coagulation defect, unspecified: Secondary | ICD-10-CM | POA: Diagnosis not present

## 2014-12-26 DIAGNOSIS — N186 End stage renal disease: Secondary | ICD-10-CM | POA: Diagnosis not present

## 2014-12-26 DIAGNOSIS — N2581 Secondary hyperparathyroidism of renal origin: Secondary | ICD-10-CM | POA: Diagnosis not present

## 2014-12-26 DIAGNOSIS — D509 Iron deficiency anemia, unspecified: Secondary | ICD-10-CM | POA: Diagnosis not present

## 2014-12-26 DIAGNOSIS — D631 Anemia in chronic kidney disease: Secondary | ICD-10-CM | POA: Diagnosis not present

## 2014-12-29 DIAGNOSIS — D631 Anemia in chronic kidney disease: Secondary | ICD-10-CM | POA: Diagnosis not present

## 2014-12-29 DIAGNOSIS — D689 Coagulation defect, unspecified: Secondary | ICD-10-CM | POA: Diagnosis not present

## 2014-12-29 DIAGNOSIS — N186 End stage renal disease: Secondary | ICD-10-CM | POA: Diagnosis not present

## 2014-12-29 DIAGNOSIS — N2581 Secondary hyperparathyroidism of renal origin: Secondary | ICD-10-CM | POA: Diagnosis not present

## 2014-12-29 DIAGNOSIS — D509 Iron deficiency anemia, unspecified: Secondary | ICD-10-CM | POA: Diagnosis not present

## 2014-12-31 DIAGNOSIS — N2581 Secondary hyperparathyroidism of renal origin: Secondary | ICD-10-CM | POA: Diagnosis not present

## 2014-12-31 DIAGNOSIS — N186 End stage renal disease: Secondary | ICD-10-CM | POA: Diagnosis not present

## 2014-12-31 DIAGNOSIS — D631 Anemia in chronic kidney disease: Secondary | ICD-10-CM | POA: Diagnosis not present

## 2014-12-31 DIAGNOSIS — D509 Iron deficiency anemia, unspecified: Secondary | ICD-10-CM | POA: Diagnosis not present

## 2014-12-31 DIAGNOSIS — D689 Coagulation defect, unspecified: Secondary | ICD-10-CM | POA: Diagnosis not present

## 2015-01-02 DIAGNOSIS — Z992 Dependence on renal dialysis: Secondary | ICD-10-CM | POA: Diagnosis not present

## 2015-01-02 DIAGNOSIS — I120 Hypertensive chronic kidney disease with stage 5 chronic kidney disease or end stage renal disease: Secondary | ICD-10-CM | POA: Diagnosis not present

## 2015-01-02 DIAGNOSIS — N186 End stage renal disease: Secondary | ICD-10-CM | POA: Diagnosis not present

## 2015-01-05 DIAGNOSIS — N2581 Secondary hyperparathyroidism of renal origin: Secondary | ICD-10-CM | POA: Diagnosis not present

## 2015-01-05 DIAGNOSIS — D689 Coagulation defect, unspecified: Secondary | ICD-10-CM | POA: Diagnosis not present

## 2015-01-05 DIAGNOSIS — N186 End stage renal disease: Secondary | ICD-10-CM | POA: Diagnosis not present

## 2015-01-05 DIAGNOSIS — D631 Anemia in chronic kidney disease: Secondary | ICD-10-CM | POA: Diagnosis not present

## 2015-01-05 DIAGNOSIS — D509 Iron deficiency anemia, unspecified: Secondary | ICD-10-CM | POA: Diagnosis not present

## 2015-01-06 DIAGNOSIS — N033 Chronic nephritic syndrome with diffuse mesangial proliferative glomerulonephritis: Secondary | ICD-10-CM | POA: Diagnosis not present

## 2015-01-06 DIAGNOSIS — N186 End stage renal disease: Secondary | ICD-10-CM | POA: Diagnosis not present

## 2015-01-06 DIAGNOSIS — Z992 Dependence on renal dialysis: Secondary | ICD-10-CM | POA: Diagnosis not present

## 2015-01-07 DIAGNOSIS — D631 Anemia in chronic kidney disease: Secondary | ICD-10-CM | POA: Diagnosis not present

## 2015-01-07 DIAGNOSIS — N2581 Secondary hyperparathyroidism of renal origin: Secondary | ICD-10-CM | POA: Diagnosis not present

## 2015-01-07 DIAGNOSIS — D689 Coagulation defect, unspecified: Secondary | ICD-10-CM | POA: Diagnosis not present

## 2015-01-07 DIAGNOSIS — N186 End stage renal disease: Secondary | ICD-10-CM | POA: Diagnosis not present

## 2015-01-09 DIAGNOSIS — D631 Anemia in chronic kidney disease: Secondary | ICD-10-CM | POA: Diagnosis not present

## 2015-01-09 DIAGNOSIS — N186 End stage renal disease: Secondary | ICD-10-CM | POA: Diagnosis not present

## 2015-01-09 DIAGNOSIS — D689 Coagulation defect, unspecified: Secondary | ICD-10-CM | POA: Diagnosis not present

## 2015-01-09 DIAGNOSIS — N2581 Secondary hyperparathyroidism of renal origin: Secondary | ICD-10-CM | POA: Diagnosis not present

## 2015-01-12 DIAGNOSIS — D689 Coagulation defect, unspecified: Secondary | ICD-10-CM | POA: Diagnosis not present

## 2015-01-12 DIAGNOSIS — N2581 Secondary hyperparathyroidism of renal origin: Secondary | ICD-10-CM | POA: Diagnosis not present

## 2015-01-12 DIAGNOSIS — N186 End stage renal disease: Secondary | ICD-10-CM | POA: Diagnosis not present

## 2015-01-12 DIAGNOSIS — D631 Anemia in chronic kidney disease: Secondary | ICD-10-CM | POA: Diagnosis not present

## 2015-01-14 DIAGNOSIS — D631 Anemia in chronic kidney disease: Secondary | ICD-10-CM | POA: Diagnosis not present

## 2015-01-14 DIAGNOSIS — D689 Coagulation defect, unspecified: Secondary | ICD-10-CM | POA: Diagnosis not present

## 2015-01-14 DIAGNOSIS — N2581 Secondary hyperparathyroidism of renal origin: Secondary | ICD-10-CM | POA: Diagnosis not present

## 2015-01-14 DIAGNOSIS — N186 End stage renal disease: Secondary | ICD-10-CM | POA: Diagnosis not present

## 2015-01-16 DIAGNOSIS — N186 End stage renal disease: Secondary | ICD-10-CM | POA: Diagnosis not present

## 2015-01-16 DIAGNOSIS — N2581 Secondary hyperparathyroidism of renal origin: Secondary | ICD-10-CM | POA: Diagnosis not present

## 2015-01-16 DIAGNOSIS — D631 Anemia in chronic kidney disease: Secondary | ICD-10-CM | POA: Diagnosis not present

## 2015-01-16 DIAGNOSIS — D689 Coagulation defect, unspecified: Secondary | ICD-10-CM | POA: Diagnosis not present

## 2015-01-19 DIAGNOSIS — N186 End stage renal disease: Secondary | ICD-10-CM | POA: Diagnosis not present

## 2015-01-19 DIAGNOSIS — N2581 Secondary hyperparathyroidism of renal origin: Secondary | ICD-10-CM | POA: Diagnosis not present

## 2015-01-19 DIAGNOSIS — D689 Coagulation defect, unspecified: Secondary | ICD-10-CM | POA: Diagnosis not present

## 2015-01-19 DIAGNOSIS — D631 Anemia in chronic kidney disease: Secondary | ICD-10-CM | POA: Diagnosis not present

## 2015-01-21 DIAGNOSIS — N2581 Secondary hyperparathyroidism of renal origin: Secondary | ICD-10-CM | POA: Diagnosis not present

## 2015-01-21 DIAGNOSIS — N186 End stage renal disease: Secondary | ICD-10-CM | POA: Diagnosis not present

## 2015-01-21 DIAGNOSIS — D631 Anemia in chronic kidney disease: Secondary | ICD-10-CM | POA: Diagnosis not present

## 2015-01-21 DIAGNOSIS — D689 Coagulation defect, unspecified: Secondary | ICD-10-CM | POA: Diagnosis not present

## 2015-01-22 DIAGNOSIS — N186 End stage renal disease: Secondary | ICD-10-CM | POA: Diagnosis not present

## 2015-01-22 DIAGNOSIS — Z01818 Encounter for other preprocedural examination: Secondary | ICD-10-CM | POA: Diagnosis not present

## 2015-01-23 DIAGNOSIS — D631 Anemia in chronic kidney disease: Secondary | ICD-10-CM | POA: Diagnosis not present

## 2015-01-23 DIAGNOSIS — N2581 Secondary hyperparathyroidism of renal origin: Secondary | ICD-10-CM | POA: Diagnosis not present

## 2015-01-23 DIAGNOSIS — D689 Coagulation defect, unspecified: Secondary | ICD-10-CM | POA: Diagnosis not present

## 2015-01-23 DIAGNOSIS — N186 End stage renal disease: Secondary | ICD-10-CM | POA: Diagnosis not present

## 2015-01-26 DIAGNOSIS — N186 End stage renal disease: Secondary | ICD-10-CM | POA: Diagnosis not present

## 2015-01-26 DIAGNOSIS — N2581 Secondary hyperparathyroidism of renal origin: Secondary | ICD-10-CM | POA: Diagnosis not present

## 2015-01-26 DIAGNOSIS — D689 Coagulation defect, unspecified: Secondary | ICD-10-CM | POA: Diagnosis not present

## 2015-01-26 DIAGNOSIS — D631 Anemia in chronic kidney disease: Secondary | ICD-10-CM | POA: Diagnosis not present

## 2015-01-28 DIAGNOSIS — D689 Coagulation defect, unspecified: Secondary | ICD-10-CM | POA: Diagnosis not present

## 2015-01-28 DIAGNOSIS — N186 End stage renal disease: Secondary | ICD-10-CM | POA: Diagnosis not present

## 2015-01-28 DIAGNOSIS — N2581 Secondary hyperparathyroidism of renal origin: Secondary | ICD-10-CM | POA: Diagnosis not present

## 2015-01-28 DIAGNOSIS — D631 Anemia in chronic kidney disease: Secondary | ICD-10-CM | POA: Diagnosis not present

## 2015-01-29 ENCOUNTER — Other Ambulatory Visit: Payer: Self-pay | Admitting: Internal Medicine

## 2015-01-29 ENCOUNTER — Other Ambulatory Visit: Payer: Self-pay | Admitting: Emergency Medicine

## 2015-01-29 MED ORDER — PROPRANOLOL HCL 40 MG PO TABS: ORAL_TABLET | ORAL | Status: DC

## 2015-01-30 DIAGNOSIS — N186 End stage renal disease: Secondary | ICD-10-CM | POA: Diagnosis not present

## 2015-01-30 DIAGNOSIS — N2581 Secondary hyperparathyroidism of renal origin: Secondary | ICD-10-CM | POA: Diagnosis not present

## 2015-01-30 DIAGNOSIS — D631 Anemia in chronic kidney disease: Secondary | ICD-10-CM | POA: Diagnosis not present

## 2015-01-30 DIAGNOSIS — D689 Coagulation defect, unspecified: Secondary | ICD-10-CM | POA: Diagnosis not present

## 2015-01-31 ENCOUNTER — Encounter (HOSPITAL_COMMUNITY): Payer: Self-pay | Admitting: Emergency Medicine

## 2015-01-31 ENCOUNTER — Emergency Department (HOSPITAL_COMMUNITY)
Admission: EM | Admit: 2015-01-31 | Discharge: 2015-01-31 | Disposition: A | Discharge status: Home / Self Care | Payer: Medicare Other | Attending: Emergency Medicine | Admitting: Emergency Medicine

## 2015-01-31 DIAGNOSIS — Z85528 Personal history of other malignant neoplasm of kidney: Secondary | ICD-10-CM | POA: Diagnosis not present

## 2015-01-31 DIAGNOSIS — M8580 Other specified disorders of bone density and structure, unspecified site: Secondary | ICD-10-CM | POA: Diagnosis not present

## 2015-01-31 DIAGNOSIS — M109 Gout, unspecified: Secondary | ICD-10-CM | POA: Diagnosis not present

## 2015-01-31 DIAGNOSIS — I120 Hypertensive chronic kidney disease with stage 5 chronic kidney disease or end stage renal disease: Secondary | ICD-10-CM | POA: Diagnosis not present

## 2015-01-31 DIAGNOSIS — Z7951 Long term (current) use of inhaled steroids: Secondary | ICD-10-CM | POA: Insufficient documentation

## 2015-01-31 DIAGNOSIS — S60551A Superficial foreign body of right hand, initial encounter: Secondary | ICD-10-CM | POA: Diagnosis not present

## 2015-01-31 DIAGNOSIS — Y9389 Activity, other specified: Secondary | ICD-10-CM | POA: Diagnosis not present

## 2015-01-31 DIAGNOSIS — M1990 Unspecified osteoarthritis, unspecified site: Secondary | ICD-10-CM | POA: Diagnosis not present

## 2015-01-31 DIAGNOSIS — Z79899 Other long term (current) drug therapy: Secondary | ICD-10-CM | POA: Insufficient documentation

## 2015-01-31 DIAGNOSIS — S6991XA Unspecified injury of right wrist, hand and finger(s), initial encounter: Secondary | ICD-10-CM

## 2015-01-31 DIAGNOSIS — Y9289 Other specified places as the place of occurrence of the external cause: Secondary | ICD-10-CM | POA: Diagnosis not present

## 2015-01-31 DIAGNOSIS — Z862 Personal history of diseases of the blood and blood-forming organs and certain disorders involving the immune mechanism: Secondary | ICD-10-CM | POA: Diagnosis not present

## 2015-01-31 DIAGNOSIS — K219 Gastro-esophageal reflux disease without esophagitis: Secondary | ICD-10-CM | POA: Diagnosis not present

## 2015-01-31 DIAGNOSIS — Z23 Encounter for immunization: Secondary | ICD-10-CM | POA: Insufficient documentation

## 2015-01-31 DIAGNOSIS — Z7982 Long term (current) use of aspirin: Secondary | ICD-10-CM | POA: Diagnosis not present

## 2015-01-31 DIAGNOSIS — Z992 Dependence on renal dialysis: Secondary | ICD-10-CM | POA: Diagnosis not present

## 2015-01-31 DIAGNOSIS — W458XXA Other foreign body or object entering through skin, initial encounter: Secondary | ICD-10-CM | POA: Insufficient documentation

## 2015-01-31 DIAGNOSIS — I471 Supraventricular tachycardia: Secondary | ICD-10-CM | POA: Diagnosis not present

## 2015-01-31 DIAGNOSIS — Y998 Other external cause status: Secondary | ICD-10-CM | POA: Insufficient documentation

## 2015-01-31 DIAGNOSIS — N186 End stage renal disease: Secondary | ICD-10-CM | POA: Diagnosis not present

## 2015-01-31 MED ORDER — BACITRACIN 500 UNIT/GM EX OINT
1.0000 "application " | TOPICAL_OINTMENT | Freq: Two times a day (BID) | CUTANEOUS | Status: DC
  Administered 2015-01-31: 1 via TOPICAL
  Filled 2015-01-31 (×3): qty 0.9

## 2015-01-31 MED ORDER — TETANUS-DIPHTH-ACELL PERTUSSIS 5-2.5-18.5 LF-MCG/0.5 IM SUSP
0.5000 mL | Freq: Once | INTRAMUSCULAR | Status: DC
  Filled 2015-01-31: qty 0.5

## 2015-01-31 MED ORDER — CLINDAMYCIN HCL 300 MG PO CAPS: 300.0000 mg | ORAL_CAPSULE | Freq: Four times a day (QID) | ORAL | Status: DC

## 2015-01-31 MED ORDER — CLINDAMYCIN HCL 300 MG PO CAPS
300.0000 mg | ORAL_CAPSULE | Freq: Once | ORAL | Status: AC
  Administered 2015-01-31: 300 mg via ORAL
  Filled 2015-01-31: qty 1

## 2015-01-31 NOTE — ED Provider Notes (Signed)
TIME SEEN: 7:10 AM  CHIEF COMPLAINT: Fish hook to right hand  HPI: Pt is a 69 y.o. LHD female with history of hypertension, FSGS currently on hemodialysis who presents emergency department with a fishhook to the palmar aspect of her right hand. Since she was getting in the car this morning with her sister and sat on a fishhook that was in a car seat. The [place] became attached to the back of her jeans as well as her hand. She is unsure when her last tetanus vaccination was. No other injury.   ROS: See HPI Constitutional: no fever  Eyes: no drainage  ENT: no runny nose   Cardiovascular:  no chest pain  Resp: no SOB  GI: no vomiting GU: no dysuria Integumentary: no rash  Allergy: no hives  Musculoskeletal: no leg swelling  Neurological: no slurred speech ROS otherwise negative  PAST MEDICAL HISTORY/PAST SURGICAL HISTORY:  Past Medical History  Diagnosis Date  . Hypertension   . Hypothyroidism   . Gout   . Arthritis   . Anemia     r/t kidney function- receives Procrit  . Cancer     kidney  . FSGS (focal segmental glomerulosclerosis)     right kidney;  . Osteopenia   . Diverticulosis     a. 05/2012 colonoscopy  . CKD (chronic kidney disease)     followed by Dr. [name].  . SVT (supraventricular tachycardia)   . GERD (gastroesophageal reflux disease)   . Hiatal hernia     MEDICATIONS:  Prior to Admission medications   Medication Sig Start Date End Date Taking? Authorizing Provider  acetaminophen (TYLENOL) 500 MG tablet Take 500-750 mg by mouth every 6 (six) hours as needed for pain.    Historical Provider, MD  amoxicillin (AMOXIL) 875 MG tablet Take 1 twice a day X 10 days. 10/28/14   Jacqulyn Cane, MD  aspirin EC 81 MG EC tablet Take 1 tablet (81 mg total) by mouth daily. 12/09/12   Brooke O Edmisten, PA-C  AYR SALINE NASAL DROPS NA Place 2 sprays into both nostrils daily.    Historical Provider, MD  CALCIUM CARBONATE PO Take 2 tablets by mouth 3 (three) times daily.     Historical Provider, MD  Camphor-Eucalyptus-Menthol (VICKS VAPORUB EX) Apply 1 application topically daily.    Historical Provider, MD  clobetasol ointment (TEMOVATE) AB-123456789 % Apply 1 application topically 2 (two) times daily. Apply to affected area for two weeks then once a day for 2 weeks. 06/22/14   Guss Bunde, MD  desonide (DESOWEN) 0.05 % cream  03/26/14   Historical Provider, MD  econazole nitrate 1 % cream  03/26/14   Historical Provider, MD  fluticasone (FLONASE) 50 MCG/ACT nasal spray Place 1 spray into the nose 2 (two) times daily as needed for rhinitis. 05/16/13   Hendricks Limes, MD  ketoconazole (NIZORAL) 2 % cream Apply 1 application topically 2 (two) times daily. 06/10/14   Hendricks Limes, MD  levothyroxine (SYNTHROID, LEVOTHROID) 175 MCG tablet take 1 tablet by mouth once daily EXCEPT ON [facility] TAKE 1AND1/2 tablets 11/11/14   Hendricks Limes, MD  loratadine (CLARITIN) 10 MG tablet Take 20 mg by mouth as needed for allergies.    Historical Provider, MD  multivitamin (RENA-VIT) TABS tablet take 1 tablet by mouth daily 07/28/14   Hendricks Limes, MD  nystatin (MYCOSTATIN/NYSTOP) 100000 UNIT/GM POWD Apply 1 g topically 2 (two) times daily. 06/05/14   Guss Bunde, MD  omeprazole ([place]) 40  MG capsule Take 1 capsule (40 mg total) by mouth daily. 09/18/14   Irene Shipper, MD  propranolol (INDERAL) 40 MG tablet take 1 tablet by mouth once daily ON TUES, THURS, SAT, AND SUN. AND TWICE DAILY MON., WED, AND FRI. 01/29/15   Hendricks Limes, MD  propranolol (INDERAL) 40 MG tablet Take 1 / 2 tablet once daily on Monday, Wednesday, and Friday (dialysis day)  and take 1/ 2 tablet  twice a day on Sunday, Tuesday, Thursday, and Saturday. 01/29/15   Hendricks Limes, MD  triamcinolone cream (KENALOG) 0.1 % Apply 1 application topically 2 (two) times daily. 06/05/14   Guss Bunde, MD  verapamil (CALAN-SR) 120 MG CR tablet Take 1 tablet (120 mg total) by mouth daily. 12/15/14   Hendricks Limes,  MD    ALLERGIES:  Allergies  Allergen Reactions  . Cefaclor Rash  . Naproxen Rash  . Enalapril Maleate Cough  . Metoprolol Tartrate Rash    On legs    SOCIAL HISTORY:  History  Substance Use Topics  . Smoking status: Never Smoker   . Smokeless tobacco: Never Used  . Alcohol Use: Yes     Comment: rarely    FAMILY HISTORY: Family History  Problem Relation Age of Onset  . Deep vein thrombosis Mother     post thyroid surgery  . Hypertension Mother   . Heart attack Father 78    deceased  . Hypertension Father   . Cancer Sister     breast  . Cancer Paternal Aunt     pancreatic  . Diabetes Maternal Grandfather   . Stroke Paternal Grandmother     in 25s  . Colon cancer Neg Hx   . Esophageal cancer Neg Hx   . Stomach cancer Neg Hx   . Rectal cancer Neg Hx     EXAM: BP 153/87 mmHg  Pulse 73  Temp(Src) 98.1 F (36.7 C) (Oral)  Resp 16  Wt 216 lb (97.977 kg)  SpO2 96% CONSTITUTIONAL: Alert and oriented and responds appropriately to questions. Well-appearing; well-nourished HEAD: Normocephalic EYES: Conjunctivae clear, PERRL ENT: normal nose; no rhinorrhea; moist mucous membranes; pharynx without lesions noted NECK: Supple, no meningismus, no LAD  CARD: RRR; S1 and S2 appreciated; no murmurs, no clicks, no rubs, no gallops RESP: Normal chest excursion without splinting or tachypnea; breath sounds clear and equal bilaterally; no wheezes, no rhonchi, no rales, no hypoxia or respiratory distress, speaking full sentences ABD/GI: Normal bowel sounds; non-distended; soft, non-tender, no rebound, no guarding, no peritoneal signs BACK:  The back appears normal and is non-tender to palpation, there is no CVA tenderness EXT: AV fistula in the right upper extremity with good thrill and bruit, 2+ radial pulses bilaterally, patient has a 4 pronged small fistula with 2 hooks very superficially attached to the palmar aspect of the right hand and 2 hooks attached to her jean pocket;  Normal ROM in all joints; otherwise extremities are non-tender to palpation; no edema; normal capillary refill; no cyanosis, no calf tenderness or swelling    SKIN: Normal color for age and race; warm NEURO: Moves all extremities equally, sensation to light touch intact diffusely, cranial nerves II through XII intact PSYCH: The patient's mood and manner are appropriate. Grooming and personal hygiene are appropriate.  MEDICAL DECISION MAKING: Patient with fishhook very superficially in the palmar aspect of the right hand. Easily removed with gentle traction. Patient did not require any local anesthesia. Given this is in the same  arm as her fistula I will place her on empiric antibiotics. She is not sure if this hook has ever been used. It does not appear dirty. Wound is not deep. Hook completely removed from hand and no residual foreign body left and patient's hand. Updated her tetanus vaccination. Cleaned using chlorhexidine and applied bacitracin to wound.  Discussed return precautions with patient and have recommended close outpatient follow-up if she develops any symptoms of infection. Discussed cleaning wound several times a day, using Neosporin. She verbalized understanding and is comfortable with this plan.    FOREIGN BODY REMOVAL Date/Time: 01/31/2015 7:15 AM Performed by: Nyra Jabs Authorized by: Nyra Jabs Consent: Verbal consent obtained. Risks and benefits: risks, benefits and alternatives were discussed Consent given by: patient Patient identity confirmed: verbally with patient Body area: skin Patient sedated: no Patient restrained: no Localization method: visualized Removal mechanism: forceps Dressing: antibiotic ointment Tendon involvement: none Depth: subcutaneous Complexity: simple 1 objects recovered. Objects recovered: 1 fish hook with 4 hooks - 2 embedded into palmar aspect of right hand and two attached to her pants Post-procedure assessment: foreign body  removed Patient tolerance: Patient tolerated the procedure well with no immediate complications    [place], DO 01/31/15 [id number]

## 2015-01-31 NOTE — ED Notes (Signed)
Pt sat on a fishing hook and the hook got caught on her jeans and her pinky finger on her right hand

## 2015-01-31 NOTE — Discharge Instructions (Signed)
Fish Hook Removal °A fish hook can cause a small cut or lesion that extends through all layers of the skin and into the subcutaneous tissue. Because of this, bacteria may get injected beneath the surface of the skin. A simple bandage (dressing) may be applied. This should be changed daily. Follow your caregiver's instructions regarding use of any antibacterial ointments.  °Only take over-the-counter or prescription medicines for pain, discomfort, or fever as directed by your caregiver. °If you did not receive a tetanus shot from your caregiver because you did not recall when your last one was given, make sure to check with your physician's office and determine if one is needed. Generally for a "dirty" wound, you should receive a tetanus booster if you have not had one in the last five years. If you have a "clean" wound, you should receive a tetanus booster if you have not had one within the last ten years. °SEEK IMMEDIATE MEDICAL CARE IF:  °· You develop redness, swelling, or increasing pain in the wound. °· You have a fever. °· You notice a bad smell coming from the wound or dressing. °· You notice pus or other unusual drainage coming from the wound. °Document Released: 07/22/2000 Document Revised: 10/17/2011 Document Reviewed: 02/12/2009 °Exit[employer]® Patient Information ©2015 [employer]. This information is not intended to replace advice given to you by your health care provider. Make sure you discuss any questions you have with your health care provider. ° ° °

## 2015-02-02 DIAGNOSIS — D631 Anemia in chronic kidney disease: Secondary | ICD-10-CM | POA: Diagnosis not present

## 2015-02-02 DIAGNOSIS — D689 Coagulation defect, unspecified: Secondary | ICD-10-CM | POA: Diagnosis not present

## 2015-02-02 DIAGNOSIS — N2581 Secondary hyperparathyroidism of renal origin: Secondary | ICD-10-CM | POA: Diagnosis not present

## 2015-02-02 DIAGNOSIS — N186 End stage renal disease: Secondary | ICD-10-CM | POA: Diagnosis not present

## 2015-02-04 ENCOUNTER — Encounter: Payer: Self-pay | Admitting: Internal Medicine

## 2015-02-04 DIAGNOSIS — D631 Anemia in chronic kidney disease: Secondary | ICD-10-CM | POA: Diagnosis not present

## 2015-02-04 DIAGNOSIS — D689 Coagulation defect, unspecified: Secondary | ICD-10-CM | POA: Diagnosis not present

## 2015-02-04 DIAGNOSIS — N186 End stage renal disease: Secondary | ICD-10-CM | POA: Diagnosis not present

## 2015-02-04 DIAGNOSIS — N2581 Secondary hyperparathyroidism of renal origin: Secondary | ICD-10-CM | POA: Diagnosis not present

## 2015-02-05 DIAGNOSIS — N186 End stage renal disease: Secondary | ICD-10-CM | POA: Diagnosis not present

## 2015-02-05 DIAGNOSIS — N033 Chronic nephritic syndrome with diffuse mesangial proliferative glomerulonephritis: Secondary | ICD-10-CM | POA: Diagnosis not present

## 2015-02-05 DIAGNOSIS — Z992 Dependence on renal dialysis: Secondary | ICD-10-CM | POA: Diagnosis not present

## 2015-02-05 NOTE — Telephone Encounter (Signed)
Patient need copies of all immunization records, by tomorrow. Please advise

## 2015-02-06 DIAGNOSIS — D689 Coagulation defect, unspecified: Secondary | ICD-10-CM | POA: Diagnosis not present

## 2015-02-06 DIAGNOSIS — N186 End stage renal disease: Secondary | ICD-10-CM | POA: Diagnosis not present

## 2015-02-06 DIAGNOSIS — D509 Iron deficiency anemia, unspecified: Secondary | ICD-10-CM | POA: Diagnosis not present

## 2015-02-06 DIAGNOSIS — N2581 Secondary hyperparathyroidism of renal origin: Secondary | ICD-10-CM | POA: Diagnosis not present

## 2015-02-09 DIAGNOSIS — N2581 Secondary hyperparathyroidism of renal origin: Secondary | ICD-10-CM | POA: Diagnosis not present

## 2015-02-09 DIAGNOSIS — D509 Iron deficiency anemia, unspecified: Secondary | ICD-10-CM | POA: Diagnosis not present

## 2015-02-09 DIAGNOSIS — N186 End stage renal disease: Secondary | ICD-10-CM | POA: Diagnosis not present

## 2015-02-09 DIAGNOSIS — D689 Coagulation defect, unspecified: Secondary | ICD-10-CM | POA: Diagnosis not present

## 2015-02-10 DIAGNOSIS — Z01818 Encounter for other preprocedural examination: Secondary | ICD-10-CM | POA: Diagnosis not present

## 2015-02-10 DIAGNOSIS — N186 End stage renal disease: Secondary | ICD-10-CM | POA: Diagnosis not present

## 2015-02-11 DIAGNOSIS — D689 Coagulation defect, unspecified: Secondary | ICD-10-CM | POA: Diagnosis not present

## 2015-02-11 DIAGNOSIS — N186 End stage renal disease: Secondary | ICD-10-CM | POA: Diagnosis not present

## 2015-02-11 DIAGNOSIS — N2581 Secondary hyperparathyroidism of renal origin: Secondary | ICD-10-CM | POA: Diagnosis not present

## 2015-02-11 DIAGNOSIS — D509 Iron deficiency anemia, unspecified: Secondary | ICD-10-CM | POA: Diagnosis not present

## 2015-02-13 DIAGNOSIS — D689 Coagulation defect, unspecified: Secondary | ICD-10-CM | POA: Diagnosis not present

## 2015-02-13 DIAGNOSIS — D509 Iron deficiency anemia, unspecified: Secondary | ICD-10-CM | POA: Diagnosis not present

## 2015-02-13 DIAGNOSIS — N186 End stage renal disease: Secondary | ICD-10-CM | POA: Diagnosis not present

## 2015-02-13 DIAGNOSIS — N2581 Secondary hyperparathyroidism of renal origin: Secondary | ICD-10-CM | POA: Diagnosis not present

## 2015-02-16 DIAGNOSIS — D689 Coagulation defect, unspecified: Secondary | ICD-10-CM | POA: Diagnosis not present

## 2015-02-16 DIAGNOSIS — D509 Iron deficiency anemia, unspecified: Secondary | ICD-10-CM | POA: Diagnosis not present

## 2015-02-16 DIAGNOSIS — N2581 Secondary hyperparathyroidism of renal origin: Secondary | ICD-10-CM | POA: Diagnosis not present

## 2015-02-16 DIAGNOSIS — N186 End stage renal disease: Secondary | ICD-10-CM | POA: Diagnosis not present

## 2015-02-17 ENCOUNTER — Encounter: Payer: Self-pay | Admitting: Internal Medicine

## 2015-02-18 DIAGNOSIS — N2581 Secondary hyperparathyroidism of renal origin: Secondary | ICD-10-CM | POA: Diagnosis not present

## 2015-02-18 DIAGNOSIS — D689 Coagulation defect, unspecified: Secondary | ICD-10-CM | POA: Diagnosis not present

## 2015-02-18 DIAGNOSIS — N186 End stage renal disease: Secondary | ICD-10-CM | POA: Diagnosis not present

## 2015-02-18 DIAGNOSIS — D509 Iron deficiency anemia, unspecified: Secondary | ICD-10-CM | POA: Diagnosis not present

## 2015-02-20 DIAGNOSIS — N186 End stage renal disease: Secondary | ICD-10-CM | POA: Diagnosis not present

## 2015-02-20 DIAGNOSIS — D689 Coagulation defect, unspecified: Secondary | ICD-10-CM | POA: Diagnosis not present

## 2015-02-20 DIAGNOSIS — D509 Iron deficiency anemia, unspecified: Secondary | ICD-10-CM | POA: Diagnosis not present

## 2015-02-20 DIAGNOSIS — N2581 Secondary hyperparathyroidism of renal origin: Secondary | ICD-10-CM | POA: Diagnosis not present

## 2015-02-23 ENCOUNTER — Encounter: Payer: Self-pay | Admitting: Internal Medicine

## 2015-02-23 DIAGNOSIS — N2581 Secondary hyperparathyroidism of renal origin: Secondary | ICD-10-CM | POA: Diagnosis not present

## 2015-02-23 DIAGNOSIS — N186 End stage renal disease: Secondary | ICD-10-CM | POA: Diagnosis not present

## 2015-02-23 DIAGNOSIS — D509 Iron deficiency anemia, unspecified: Secondary | ICD-10-CM | POA: Diagnosis not present

## 2015-02-23 DIAGNOSIS — D689 Coagulation defect, unspecified: Secondary | ICD-10-CM | POA: Diagnosis not present

## 2015-02-24 DIAGNOSIS — N186 End stage renal disease: Secondary | ICD-10-CM | POA: Diagnosis not present

## 2015-02-24 DIAGNOSIS — D509 Iron deficiency anemia, unspecified: Secondary | ICD-10-CM | POA: Diagnosis not present

## 2015-02-24 DIAGNOSIS — D689 Coagulation defect, unspecified: Secondary | ICD-10-CM | POA: Diagnosis not present

## 2015-02-24 DIAGNOSIS — N2581 Secondary hyperparathyroidism of renal origin: Secondary | ICD-10-CM | POA: Diagnosis not present

## 2015-02-27 DIAGNOSIS — D689 Coagulation defect, unspecified: Secondary | ICD-10-CM | POA: Diagnosis not present

## 2015-02-27 DIAGNOSIS — N186 End stage renal disease: Secondary | ICD-10-CM | POA: Diagnosis not present

## 2015-02-27 DIAGNOSIS — N2581 Secondary hyperparathyroidism of renal origin: Secondary | ICD-10-CM | POA: Diagnosis not present

## 2015-03-02 DIAGNOSIS — N2581 Secondary hyperparathyroidism of renal origin: Secondary | ICD-10-CM | POA: Diagnosis not present

## 2015-03-02 DIAGNOSIS — D689 Coagulation defect, unspecified: Secondary | ICD-10-CM | POA: Diagnosis not present

## 2015-03-02 DIAGNOSIS — D509 Iron deficiency anemia, unspecified: Secondary | ICD-10-CM | POA: Diagnosis not present

## 2015-03-02 DIAGNOSIS — N186 End stage renal disease: Secondary | ICD-10-CM | POA: Diagnosis not present

## 2015-03-03 NOTE — Telephone Encounter (Signed)
Tabitha Green patient ask you to call her. She didn't give me a reason.

## 2015-03-04 DIAGNOSIS — N2581 Secondary hyperparathyroidism of renal origin: Secondary | ICD-10-CM | POA: Diagnosis not present

## 2015-03-04 DIAGNOSIS — N186 End stage renal disease: Secondary | ICD-10-CM | POA: Diagnosis not present

## 2015-03-04 DIAGNOSIS — D689 Coagulation defect, unspecified: Secondary | ICD-10-CM | POA: Diagnosis not present

## 2015-03-04 DIAGNOSIS — D509 Iron deficiency anemia, unspecified: Secondary | ICD-10-CM | POA: Diagnosis not present

## 2015-03-06 DIAGNOSIS — D509 Iron deficiency anemia, unspecified: Secondary | ICD-10-CM | POA: Diagnosis not present

## 2015-03-06 DIAGNOSIS — D689 Coagulation defect, unspecified: Secondary | ICD-10-CM | POA: Diagnosis not present

## 2015-03-06 DIAGNOSIS — N186 End stage renal disease: Secondary | ICD-10-CM | POA: Diagnosis not present

## 2015-03-06 DIAGNOSIS — N2581 Secondary hyperparathyroidism of renal origin: Secondary | ICD-10-CM | POA: Diagnosis not present

## 2015-03-08 DIAGNOSIS — N033 Chronic nephritic syndrome with diffuse mesangial proliferative glomerulonephritis: Secondary | ICD-10-CM | POA: Diagnosis not present

## 2015-03-08 DIAGNOSIS — N186 End stage renal disease: Secondary | ICD-10-CM | POA: Diagnosis not present

## 2015-03-08 DIAGNOSIS — Z992 Dependence on renal dialysis: Secondary | ICD-10-CM | POA: Diagnosis not present

## 2015-03-09 ENCOUNTER — Telehealth: Payer: Self-pay | Admitting: Emergency Medicine

## 2015-03-09 ENCOUNTER — Telehealth: Payer: Self-pay | Admitting: Internal Medicine

## 2015-03-09 DIAGNOSIS — D689 Coagulation defect, unspecified: Secondary | ICD-10-CM | POA: Diagnosis not present

## 2015-03-09 DIAGNOSIS — Z789 Other specified health status: Secondary | ICD-10-CM

## 2015-03-09 DIAGNOSIS — D509 Iron deficiency anemia, unspecified: Secondary | ICD-10-CM | POA: Diagnosis not present

## 2015-03-09 DIAGNOSIS — N2581 Secondary hyperparathyroidism of renal origin: Secondary | ICD-10-CM | POA: Diagnosis not present

## 2015-03-09 DIAGNOSIS — Z23 Encounter for immunization: Secondary | ICD-10-CM

## 2015-03-09 DIAGNOSIS — N186 End stage renal disease: Secondary | ICD-10-CM | POA: Diagnosis not present

## 2015-03-09 NOTE — Telephone Encounter (Signed)
LVM for pt to call back.

## 2015-03-09 NOTE — Telephone Encounter (Signed)
Spoke with pt, informed her that we would do a Titer to check for immunity before doing another Varicella vaccine. Orders are in and pt is instructed to go to lab

## 2015-03-09 NOTE — Telephone Encounter (Signed)
Please call patient regarding her chicken pox and shingles vaccine. She can be reached at [phone]

## 2015-03-10 DIAGNOSIS — N186 End stage renal disease: Secondary | ICD-10-CM | POA: Diagnosis not present

## 2015-03-10 DIAGNOSIS — R5383 Other fatigue: Secondary | ICD-10-CM | POA: Diagnosis not present

## 2015-03-10 DIAGNOSIS — R079 Chest pain, unspecified: Secondary | ICD-10-CM | POA: Diagnosis not present

## 2015-03-10 DIAGNOSIS — I517 Cardiomegaly: Secondary | ICD-10-CM | POA: Diagnosis not present

## 2015-03-11 DIAGNOSIS — N2581 Secondary hyperparathyroidism of renal origin: Secondary | ICD-10-CM | POA: Diagnosis not present

## 2015-03-11 DIAGNOSIS — D509 Iron deficiency anemia, unspecified: Secondary | ICD-10-CM | POA: Diagnosis not present

## 2015-03-11 DIAGNOSIS — D689 Coagulation defect, unspecified: Secondary | ICD-10-CM | POA: Diagnosis not present

## 2015-03-11 DIAGNOSIS — N186 End stage renal disease: Secondary | ICD-10-CM | POA: Diagnosis not present

## 2015-03-13 DIAGNOSIS — D689 Coagulation defect, unspecified: Secondary | ICD-10-CM | POA: Diagnosis not present

## 2015-03-13 DIAGNOSIS — N186 End stage renal disease: Secondary | ICD-10-CM | POA: Diagnosis not present

## 2015-03-13 DIAGNOSIS — N2581 Secondary hyperparathyroidism of renal origin: Secondary | ICD-10-CM | POA: Diagnosis not present

## 2015-03-13 DIAGNOSIS — D509 Iron deficiency anemia, unspecified: Secondary | ICD-10-CM | POA: Diagnosis not present

## 2015-03-14 NOTE — Telephone Encounter (Signed)
[facility] to have the varicella titer drawn. Pt was instructed that the lab is open Monday - Friday and that we were not able to collect the specimen at this time.

## 2015-03-16 DIAGNOSIS — N2581 Secondary hyperparathyroidism of renal origin: Secondary | ICD-10-CM | POA: Diagnosis not present

## 2015-03-16 DIAGNOSIS — D509 Iron deficiency anemia, unspecified: Secondary | ICD-10-CM | POA: Diagnosis not present

## 2015-03-16 DIAGNOSIS — N186 End stage renal disease: Secondary | ICD-10-CM | POA: Diagnosis not present

## 2015-03-16 DIAGNOSIS — D689 Coagulation defect, unspecified: Secondary | ICD-10-CM | POA: Diagnosis not present

## 2015-03-17 ENCOUNTER — Other Ambulatory Visit: Payer: Medicare Other

## 2015-03-17 DIAGNOSIS — Z789 Other specified health status: Secondary | ICD-10-CM | POA: Diagnosis not present

## 2015-03-18 DIAGNOSIS — N186 End stage renal disease: Secondary | ICD-10-CM | POA: Diagnosis not present

## 2015-03-18 DIAGNOSIS — D509 Iron deficiency anemia, unspecified: Secondary | ICD-10-CM | POA: Diagnosis not present

## 2015-03-18 DIAGNOSIS — D689 Coagulation defect, unspecified: Secondary | ICD-10-CM | POA: Diagnosis not present

## 2015-03-18 DIAGNOSIS — N2581 Secondary hyperparathyroidism of renal origin: Secondary | ICD-10-CM | POA: Diagnosis not present

## 2015-03-18 LAB — VARICELLA ZOSTER ABS, IGG/IGM: Varicella zoster IgG: 237 index (ref >165)

## 2015-03-20 DIAGNOSIS — D689 Coagulation defect, unspecified: Secondary | ICD-10-CM | POA: Diagnosis not present

## 2015-03-20 DIAGNOSIS — D509 Iron deficiency anemia, unspecified: Secondary | ICD-10-CM | POA: Diagnosis not present

## 2015-03-20 DIAGNOSIS — N186 End stage renal disease: Secondary | ICD-10-CM | POA: Diagnosis not present

## 2015-03-20 DIAGNOSIS — N2581 Secondary hyperparathyroidism of renal origin: Secondary | ICD-10-CM | POA: Diagnosis not present

## 2015-03-23 DIAGNOSIS — D689 Coagulation defect, unspecified: Secondary | ICD-10-CM | POA: Diagnosis not present

## 2015-03-23 DIAGNOSIS — D509 Iron deficiency anemia, unspecified: Secondary | ICD-10-CM | POA: Diagnosis not present

## 2015-03-23 DIAGNOSIS — N186 End stage renal disease: Secondary | ICD-10-CM | POA: Diagnosis not present

## 2015-03-23 DIAGNOSIS — N2581 Secondary hyperparathyroidism of renal origin: Secondary | ICD-10-CM | POA: Diagnosis not present

## 2015-03-25 DIAGNOSIS — N186 End stage renal disease: Secondary | ICD-10-CM | POA: Diagnosis not present

## 2015-03-25 DIAGNOSIS — D509 Iron deficiency anemia, unspecified: Secondary | ICD-10-CM | POA: Diagnosis not present

## 2015-03-25 DIAGNOSIS — D689 Coagulation defect, unspecified: Secondary | ICD-10-CM | POA: Diagnosis not present

## 2015-03-25 DIAGNOSIS — N2581 Secondary hyperparathyroidism of renal origin: Secondary | ICD-10-CM | POA: Diagnosis not present

## 2015-03-27 DIAGNOSIS — D689 Coagulation defect, unspecified: Secondary | ICD-10-CM | POA: Diagnosis not present

## 2015-03-27 DIAGNOSIS — N2581 Secondary hyperparathyroidism of renal origin: Secondary | ICD-10-CM | POA: Diagnosis not present

## 2015-03-27 DIAGNOSIS — N186 End stage renal disease: Secondary | ICD-10-CM | POA: Diagnosis not present

## 2015-03-27 DIAGNOSIS — D509 Iron deficiency anemia, unspecified: Secondary | ICD-10-CM | POA: Diagnosis not present

## 2015-03-30 DIAGNOSIS — D509 Iron deficiency anemia, unspecified: Secondary | ICD-10-CM | POA: Diagnosis not present

## 2015-03-30 DIAGNOSIS — D689 Coagulation defect, unspecified: Secondary | ICD-10-CM | POA: Diagnosis not present

## 2015-03-30 DIAGNOSIS — N2581 Secondary hyperparathyroidism of renal origin: Secondary | ICD-10-CM | POA: Diagnosis not present

## 2015-03-30 DIAGNOSIS — N186 End stage renal disease: Secondary | ICD-10-CM | POA: Diagnosis not present

## 2015-04-01 DIAGNOSIS — N2581 Secondary hyperparathyroidism of renal origin: Secondary | ICD-10-CM | POA: Diagnosis not present

## 2015-04-01 DIAGNOSIS — N186 End stage renal disease: Secondary | ICD-10-CM | POA: Diagnosis not present

## 2015-04-01 DIAGNOSIS — D509 Iron deficiency anemia, unspecified: Secondary | ICD-10-CM | POA: Diagnosis not present

## 2015-04-01 DIAGNOSIS — D689 Coagulation defect, unspecified: Secondary | ICD-10-CM | POA: Diagnosis not present

## 2015-04-03 DIAGNOSIS — D509 Iron deficiency anemia, unspecified: Secondary | ICD-10-CM | POA: Diagnosis not present

## 2015-04-03 DIAGNOSIS — N186 End stage renal disease: Secondary | ICD-10-CM | POA: Diagnosis not present

## 2015-04-03 DIAGNOSIS — D689 Coagulation defect, unspecified: Secondary | ICD-10-CM | POA: Diagnosis not present

## 2015-04-03 DIAGNOSIS — N2581 Secondary hyperparathyroidism of renal origin: Secondary | ICD-10-CM | POA: Diagnosis not present

## 2015-04-06 DIAGNOSIS — N186 End stage renal disease: Secondary | ICD-10-CM | POA: Diagnosis not present

## 2015-04-06 DIAGNOSIS — D689 Coagulation defect, unspecified: Secondary | ICD-10-CM | POA: Diagnosis not present

## 2015-04-06 DIAGNOSIS — D509 Iron deficiency anemia, unspecified: Secondary | ICD-10-CM | POA: Diagnosis not present

## 2015-04-06 DIAGNOSIS — N2581 Secondary hyperparathyroidism of renal origin: Secondary | ICD-10-CM | POA: Diagnosis not present

## 2015-04-08 DIAGNOSIS — Z992 Dependence on renal dialysis: Secondary | ICD-10-CM | POA: Diagnosis not present

## 2015-04-08 DIAGNOSIS — D509 Iron deficiency anemia, unspecified: Secondary | ICD-10-CM | POA: Diagnosis not present

## 2015-04-08 DIAGNOSIS — D689 Coagulation defect, unspecified: Secondary | ICD-10-CM | POA: Diagnosis not present

## 2015-04-08 DIAGNOSIS — N186 End stage renal disease: Secondary | ICD-10-CM | POA: Diagnosis not present

## 2015-04-08 DIAGNOSIS — N2581 Secondary hyperparathyroidism of renal origin: Secondary | ICD-10-CM | POA: Diagnosis not present

## 2015-04-08 DIAGNOSIS — N033 Chronic nephritic syndrome with diffuse mesangial proliferative glomerulonephritis: Secondary | ICD-10-CM | POA: Diagnosis not present

## 2015-04-10 DIAGNOSIS — D509 Iron deficiency anemia, unspecified: Secondary | ICD-10-CM | POA: Diagnosis not present

## 2015-04-10 DIAGNOSIS — N186 End stage renal disease: Secondary | ICD-10-CM | POA: Diagnosis not present

## 2015-04-10 DIAGNOSIS — D689 Coagulation defect, unspecified: Secondary | ICD-10-CM | POA: Diagnosis not present

## 2015-04-10 DIAGNOSIS — N2581 Secondary hyperparathyroidism of renal origin: Secondary | ICD-10-CM | POA: Diagnosis not present

## 2015-04-13 DIAGNOSIS — D689 Coagulation defect, unspecified: Secondary | ICD-10-CM | POA: Diagnosis not present

## 2015-04-13 DIAGNOSIS — N186 End stage renal disease: Secondary | ICD-10-CM | POA: Diagnosis not present

## 2015-04-13 DIAGNOSIS — D509 Iron deficiency anemia, unspecified: Secondary | ICD-10-CM | POA: Diagnosis not present

## 2015-04-13 DIAGNOSIS — N2581 Secondary hyperparathyroidism of renal origin: Secondary | ICD-10-CM | POA: Diagnosis not present

## 2015-04-15 DIAGNOSIS — N2581 Secondary hyperparathyroidism of renal origin: Secondary | ICD-10-CM | POA: Diagnosis not present

## 2015-04-15 DIAGNOSIS — N186 End stage renal disease: Secondary | ICD-10-CM | POA: Diagnosis not present

## 2015-04-15 DIAGNOSIS — D509 Iron deficiency anemia, unspecified: Secondary | ICD-10-CM | POA: Diagnosis not present

## 2015-04-15 DIAGNOSIS — D689 Coagulation defect, unspecified: Secondary | ICD-10-CM | POA: Diagnosis not present

## 2015-04-17 DIAGNOSIS — D689 Coagulation defect, unspecified: Secondary | ICD-10-CM | POA: Diagnosis not present

## 2015-04-17 DIAGNOSIS — D509 Iron deficiency anemia, unspecified: Secondary | ICD-10-CM | POA: Diagnosis not present

## 2015-04-17 DIAGNOSIS — N2581 Secondary hyperparathyroidism of renal origin: Secondary | ICD-10-CM | POA: Diagnosis not present

## 2015-04-17 DIAGNOSIS — N186 End stage renal disease: Secondary | ICD-10-CM | POA: Diagnosis not present

## 2015-04-20 DIAGNOSIS — N2581 Secondary hyperparathyroidism of renal origin: Secondary | ICD-10-CM | POA: Diagnosis not present

## 2015-04-20 DIAGNOSIS — D689 Coagulation defect, unspecified: Secondary | ICD-10-CM | POA: Diagnosis not present

## 2015-04-20 DIAGNOSIS — N186 End stage renal disease: Secondary | ICD-10-CM | POA: Diagnosis not present

## 2015-04-20 DIAGNOSIS — D509 Iron deficiency anemia, unspecified: Secondary | ICD-10-CM | POA: Diagnosis not present

## 2015-04-22 DIAGNOSIS — D689 Coagulation defect, unspecified: Secondary | ICD-10-CM | POA: Diagnosis not present

## 2015-04-22 DIAGNOSIS — N186 End stage renal disease: Secondary | ICD-10-CM | POA: Diagnosis not present

## 2015-04-22 DIAGNOSIS — N2581 Secondary hyperparathyroidism of renal origin: Secondary | ICD-10-CM | POA: Diagnosis not present

## 2015-04-22 DIAGNOSIS — D509 Iron deficiency anemia, unspecified: Secondary | ICD-10-CM | POA: Diagnosis not present

## 2015-04-24 DIAGNOSIS — D509 Iron deficiency anemia, unspecified: Secondary | ICD-10-CM | POA: Diagnosis not present

## 2015-04-24 DIAGNOSIS — N186 End stage renal disease: Secondary | ICD-10-CM | POA: Diagnosis not present

## 2015-04-24 DIAGNOSIS — D689 Coagulation defect, unspecified: Secondary | ICD-10-CM | POA: Diagnosis not present

## 2015-04-24 DIAGNOSIS — N2581 Secondary hyperparathyroidism of renal origin: Secondary | ICD-10-CM | POA: Diagnosis not present

## 2015-04-27 DIAGNOSIS — D509 Iron deficiency anemia, unspecified: Secondary | ICD-10-CM | POA: Diagnosis not present

## 2015-04-27 DIAGNOSIS — N186 End stage renal disease: Secondary | ICD-10-CM | POA: Diagnosis not present

## 2015-04-27 DIAGNOSIS — D689 Coagulation defect, unspecified: Secondary | ICD-10-CM | POA: Diagnosis not present

## 2015-04-27 DIAGNOSIS — N2581 Secondary hyperparathyroidism of renal origin: Secondary | ICD-10-CM | POA: Diagnosis not present

## 2015-04-28 DIAGNOSIS — N186 End stage renal disease: Secondary | ICD-10-CM | POA: Diagnosis not present

## 2015-04-29 DIAGNOSIS — D689 Coagulation defect, unspecified: Secondary | ICD-10-CM | POA: Diagnosis not present

## 2015-04-29 DIAGNOSIS — N186 End stage renal disease: Secondary | ICD-10-CM | POA: Diagnosis not present

## 2015-04-29 DIAGNOSIS — D509 Iron deficiency anemia, unspecified: Secondary | ICD-10-CM | POA: Diagnosis not present

## 2015-04-29 DIAGNOSIS — N2581 Secondary hyperparathyroidism of renal origin: Secondary | ICD-10-CM | POA: Diagnosis not present

## 2015-05-01 DIAGNOSIS — D509 Iron deficiency anemia, unspecified: Secondary | ICD-10-CM | POA: Diagnosis not present

## 2015-05-01 DIAGNOSIS — N186 End stage renal disease: Secondary | ICD-10-CM | POA: Diagnosis not present

## 2015-05-01 DIAGNOSIS — D689 Coagulation defect, unspecified: Secondary | ICD-10-CM | POA: Diagnosis not present

## 2015-05-01 DIAGNOSIS — N2581 Secondary hyperparathyroidism of renal origin: Secondary | ICD-10-CM | POA: Diagnosis not present

## 2015-05-04 DIAGNOSIS — N2581 Secondary hyperparathyroidism of renal origin: Secondary | ICD-10-CM | POA: Diagnosis not present

## 2015-05-04 DIAGNOSIS — D689 Coagulation defect, unspecified: Secondary | ICD-10-CM | POA: Diagnosis not present

## 2015-05-04 DIAGNOSIS — N186 End stage renal disease: Secondary | ICD-10-CM | POA: Diagnosis not present

## 2015-05-04 DIAGNOSIS — D509 Iron deficiency anemia, unspecified: Secondary | ICD-10-CM | POA: Diagnosis not present

## 2015-05-06 DIAGNOSIS — N2581 Secondary hyperparathyroidism of renal origin: Secondary | ICD-10-CM | POA: Diagnosis not present

## 2015-05-06 DIAGNOSIS — D509 Iron deficiency anemia, unspecified: Secondary | ICD-10-CM | POA: Diagnosis not present

## 2015-05-06 DIAGNOSIS — N186 End stage renal disease: Secondary | ICD-10-CM | POA: Diagnosis not present

## 2015-05-06 DIAGNOSIS — D689 Coagulation defect, unspecified: Secondary | ICD-10-CM | POA: Diagnosis not present

## 2015-05-08 DIAGNOSIS — N033 Chronic nephritic syndrome with diffuse mesangial proliferative glomerulonephritis: Secondary | ICD-10-CM | POA: Diagnosis not present

## 2015-05-08 DIAGNOSIS — N2581 Secondary hyperparathyroidism of renal origin: Secondary | ICD-10-CM | POA: Diagnosis not present

## 2015-05-08 DIAGNOSIS — D509 Iron deficiency anemia, unspecified: Secondary | ICD-10-CM | POA: Diagnosis not present

## 2015-05-08 DIAGNOSIS — N186 End stage renal disease: Secondary | ICD-10-CM | POA: Diagnosis not present

## 2015-05-08 DIAGNOSIS — Z992 Dependence on renal dialysis: Secondary | ICD-10-CM | POA: Diagnosis not present

## 2015-05-08 DIAGNOSIS — D689 Coagulation defect, unspecified: Secondary | ICD-10-CM | POA: Diagnosis not present

## 2015-05-11 DIAGNOSIS — N186 End stage renal disease: Secondary | ICD-10-CM | POA: Diagnosis not present

## 2015-05-11 DIAGNOSIS — D509 Iron deficiency anemia, unspecified: Secondary | ICD-10-CM | POA: Diagnosis not present

## 2015-05-11 DIAGNOSIS — N2581 Secondary hyperparathyroidism of renal origin: Secondary | ICD-10-CM | POA: Diagnosis not present

## 2015-05-11 DIAGNOSIS — D631 Anemia in chronic kidney disease: Secondary | ICD-10-CM | POA: Diagnosis not present

## 2015-05-11 DIAGNOSIS — D689 Coagulation defect, unspecified: Secondary | ICD-10-CM | POA: Diagnosis not present

## 2015-05-13 DIAGNOSIS — N186 End stage renal disease: Secondary | ICD-10-CM | POA: Diagnosis not present

## 2015-05-13 DIAGNOSIS — D631 Anemia in chronic kidney disease: Secondary | ICD-10-CM | POA: Diagnosis not present

## 2015-05-13 DIAGNOSIS — D689 Coagulation defect, unspecified: Secondary | ICD-10-CM | POA: Diagnosis not present

## 2015-05-13 DIAGNOSIS — N2581 Secondary hyperparathyroidism of renal origin: Secondary | ICD-10-CM | POA: Diagnosis not present

## 2015-05-13 DIAGNOSIS — D509 Iron deficiency anemia, unspecified: Secondary | ICD-10-CM | POA: Diagnosis not present

## 2015-05-14 ENCOUNTER — Encounter: Payer: Self-pay | Admitting: Internal Medicine

## 2015-05-14 ENCOUNTER — Ambulatory Visit (INDEPENDENT_AMBULATORY_CARE_PROVIDER_SITE_OTHER): Payer: Medicare Other | Admitting: Internal Medicine

## 2015-05-14 ENCOUNTER — Other Ambulatory Visit (INDEPENDENT_AMBULATORY_CARE_PROVIDER_SITE_OTHER): Payer: Medicare Other

## 2015-05-14 VITALS — BP 136/84 | HR 64 | Temp 98.1°F | Resp 16 | Ht 67.0 in | Wt 218.0 lb

## 2015-05-14 DIAGNOSIS — Z85528 Personal history of other malignant neoplasm of kidney: Secondary | ICD-10-CM | POA: Diagnosis not present

## 2015-05-14 DIAGNOSIS — N184 Chronic kidney disease, stage 4 (severe): Secondary | ICD-10-CM

## 2015-05-14 DIAGNOSIS — R109 Unspecified abdominal pain: Secondary | ICD-10-CM

## 2015-05-14 DIAGNOSIS — R1011 Right upper quadrant pain: Secondary | ICD-10-CM

## 2015-05-14 LAB — URINALYSIS, ROUTINE W REFLEX MICROSCOPIC
Bilirubin Urine: NEGATIVE
Ketones, ur: NEGATIVE
Nitrite: NEGATIVE
Specific Gravity, Urine: 1.015 (ref 1.000–1.030)
Total Protein, Urine: 100 — AB
URINE GLUCOSE: NEGATIVE
UROBILINOGEN UA: 0.2 (ref 0.0–1.0)
pH: 8.5 — AB (ref 5.0–8.0)

## 2015-05-14 MED ORDER — TRAMADOL HCL 50 MG PO TABS: 50.0000 mg | ORAL_TABLET | Freq: Three times a day (TID) | ORAL | Status: DC | PRN

## 2015-05-14 NOTE — Progress Notes (Signed)
   Subjective:    Patient ID: Tabitha Green, female    DOB: 1946-04-19, 69 y.o.   MRN: [id number]  HPI She began to have right flank pain 3 weeks ago without specific trigger or injury.This been intermittent up to level V on a 10 scale. She describes it as sharp,it has been worse when supine. Tylenol was of benefit. She has no pain today   She has no associated GI, GU, neuromuscular symptoms.  She does have history of left nephrectomy for carcinoma. She is on dialysis. She will receive dialysis tomorrow and renal function will be checked.  Review of Systems Dysuria, pyuria, hematuria, frequency, nocturia or polyuria are denied.Unexplained weight loss, abdominal pain, significant dyspepsia, dysphagia, melena, rectal bleeding, or persistently small caliber stools are denied. Fever, chills, sweats, or unexplained weight loss not present. There is no numbness, tingling, or weakness in extremities.   No loss of control of bladder or bowels. Radicular type pain absent.     Objective:   Physical Exam Pertinent or positive findings include: She has bilateral ptosis. The left pupil is larger than the right. There is a grade 2-3 holosystolic murmur because of the AV fistula in the right upper extremity.  General appearance :adequately nourished; in no distress.Well tanned.  Eyes: No conjunctival inflammation or scleral icterus is present.  Oral exam:  Lips and gums are healthy appearing.There is no oropharyngeal erythema or exudate noted. Dental hygiene is good.  Heart:  Normal rate and regular rhythm. S1 and S2 normal without gallop,  click, rub or other extra sounds    Lungs:Chest clear to auscultation; no wheezes, rhonchi,rales ,or rubs present.No increased work of breathing.   Abdomen: bowel sounds normal, soft and non-tender without masses, organomegaly or hernias noted.  No guarding or rebound. No flank tenderness to percussion.  Vascular : all pulses equal ; carotid bruits  present.  Skin:Warm & dry.  Intact without suspicious lesions or rashes ; no tenting or jaundice   Lymphatic: No lymphadenopathy is noted about the head, neck, axilla.   Neuro: Strength, tone  normal.    Assessment & Plan:  #1 flank pain  #2 end-stage renal disease  #3 history of renal cell carcinoma, status post left nephrectomy.  Plan: Urinalysis and urine culture. Renal function will be checked at dialysis tomorrow.

## 2015-05-14 NOTE — Patient Instructions (Signed)
Use an anti-inflammatory cream such as Aspercreme or Zostrix cream twice a day to the affected area as needed. In lieu of this warm moist compresses or  hot water bottle can be used. Do not apply ice .  The best exercises for the low back include freestyle swimming, stretch aerobics, and yoga. 

## 2015-05-14 NOTE — Progress Notes (Signed)
Pre visit review using our clinic review tool, if applicable. No additional management support is needed unless otherwise documented below in the visit note. 

## 2015-05-15 DIAGNOSIS — D689 Coagulation defect, unspecified: Secondary | ICD-10-CM | POA: Diagnosis not present

## 2015-05-15 DIAGNOSIS — D509 Iron deficiency anemia, unspecified: Secondary | ICD-10-CM | POA: Diagnosis not present

## 2015-05-15 DIAGNOSIS — N186 End stage renal disease: Secondary | ICD-10-CM | POA: Diagnosis not present

## 2015-05-15 DIAGNOSIS — N2581 Secondary hyperparathyroidism of renal origin: Secondary | ICD-10-CM | POA: Diagnosis not present

## 2015-05-15 DIAGNOSIS — D631 Anemia in chronic kidney disease: Secondary | ICD-10-CM | POA: Diagnosis not present

## 2015-05-18 ENCOUNTER — Encounter: Payer: Self-pay | Admitting: Internal Medicine

## 2015-05-18 DIAGNOSIS — N2581 Secondary hyperparathyroidism of renal origin: Secondary | ICD-10-CM | POA: Diagnosis not present

## 2015-05-18 DIAGNOSIS — N186 End stage renal disease: Secondary | ICD-10-CM | POA: Diagnosis not present

## 2015-05-18 DIAGNOSIS — D631 Anemia in chronic kidney disease: Secondary | ICD-10-CM | POA: Diagnosis not present

## 2015-05-18 DIAGNOSIS — D689 Coagulation defect, unspecified: Secondary | ICD-10-CM | POA: Diagnosis not present

## 2015-05-18 DIAGNOSIS — D509 Iron deficiency anemia, unspecified: Secondary | ICD-10-CM | POA: Diagnosis not present

## 2015-05-19 ENCOUNTER — Ambulatory Visit (INDEPENDENT_AMBULATORY_CARE_PROVIDER_SITE_OTHER): Payer: Medicare Other | Admitting: Family Medicine

## 2015-05-19 ENCOUNTER — Encounter: Payer: Self-pay | Admitting: Family Medicine

## 2015-05-19 VITALS — BP 123/73 | HR 65 | Temp 98.2°F | Resp 16 | Ht 65.5 in | Wt 219.0 lb

## 2015-05-19 DIAGNOSIS — R109 Unspecified abdominal pain: Secondary | ICD-10-CM

## 2015-05-19 DIAGNOSIS — I10 Essential (primary) hypertension: Secondary | ICD-10-CM | POA: Diagnosis not present

## 2015-05-19 DIAGNOSIS — N186 End stage renal disease: Secondary | ICD-10-CM | POA: Diagnosis not present

## 2015-05-19 LAB — URINALYSIS, ROUTINE W REFLEX MICROSCOPIC
BILIRUBIN URINE: NEGATIVE
KETONES UR: NEGATIVE
NITRITE: NEGATIVE
PH: 8.5 — AB (ref 5.0–8.0)
SPECIFIC GRAVITY, URINE: 1.01 (ref 1.000–1.030)
Total Protein, Urine: 100 — AB
URINE GLUCOSE: NEGATIVE
UROBILINOGEN UA: 0.2 (ref 0.0–1.0)

## 2015-05-19 NOTE — Progress Notes (Signed)
Office Note 05/21/2015  CC:  Chief Complaint  Patient presents with  . Establish Care  . Back Pain    lower right side x 3 weeks    HPI:  Tabitha Green is a 69 y.o. White female who is here to establish/transfer care. Patient's most recent primary MD: Dr. Linna Darner (retired). Old records in EPIC/HL were reviewed prior to or during today's visit.  She is on kidney transplant list; hx of L kidney removed for malignancy.  Right kidney with FSGS and has been on hemodialysis for last 2 yrs.   Recently went to Dr. Clayborn Heron 05/14/15 with R flank pain, urine was obtained but no result or sign of test ordered is in EMR.    Right flank pain started about 3 wks ago, roving up and down, usually worse at night.  No radiation of pain into flank or side of abdomen or groin region.  No rash in the area.  No fevers. No nausea.   Still makes urine: no dysuria, hematuria, urinary frequency, or urgency.  Past Medical History  Diagnosis Date  . Hypertension   . Hypothyroidism   . Gout   . Arthritis   . Anemia     r/t kidney function- receives Procrit  . Cancer ([place])     kidney  . FSGS (focal segmental glomerulosclerosis)     right kidney;  . Osteopenia   . Diverticulosis     a. 05/2012 colonoscopy  . CKD (chronic kidney disease)     followed by Dr. [name].  . SVT (supraventricular tachycardia) ([place])   . GERD (gastroesophageal reflux disease)   . Hiatal hernia     Past Surgical History  Procedure Laterality Date  . Total abdominal hysterectomy w/ bilateral salpingoophorectomy      fibroids  . Nephrectomy  1993    for malignancy- left  . Colonoscopy with polypectomy      Dr Henrene Pastor  . Av fistula placement Right     Hemodyalisis initiated 2014; schedule is M/W/F [place]  . Renal biopsy      right  . Colonoscopy  2003; 05/2012    diverticulosis, no polyps.  Recall 10 yrs  . Tee without cardioversion N/A 08/27/2013    Procedure: TRANSESOPHAGEAL ECHOCARDIOGRAM (TEE);   Surgeon: [name], MD;  Location: [facility] ENDOSCOPY;  Service: Cardiovascular;  Laterality: N/A;  . Vulva / perineum biopsy  2015    Family History  Problem Relation Age of Onset  . Deep vein thrombosis Mother     post thyroid surgery  . Hypertension Mother   . Heart attack Father 3    deceased  . Hypertension Father   . Cancer Sister     breast  . Cancer Paternal Aunt     pancreatic  . Diabetes Maternal Grandfather   . Stroke Paternal Grandmother     in 63s  . Colon cancer Neg Hx   . Esophageal cancer Neg Hx   . Stomach cancer Neg Hx   . Rectal cancer Neg Hx     Social History   Social History  . Marital Status: Married    Spouse Name: N/A  . Number of Children: 2  . Years of Education: N/A   Occupational History  . Retired    Social History Main Topics  . Smoking status: Never Smoker   . Smokeless tobacco: Never Used  . Alcohol Use: Yes     Comment: rarely  . Drug Use: No  . Sexual Activity:  Not Currently    Birth Control/ Protection: Surgical   Other Topics Concern  . Not on file   Social History Narrative   Lives in [place] with husband.  Retired.  Previously worked in [employer] @ [employer]    Outpatient Encounter Prescriptions as of 05/19/2015  Medication Sig  . acetaminophen (TYLENOL) 500 MG tablet Take 500-750 mg by mouth every 6 (six) hours as needed for pain.  [name] aspirin EC 81 MG EC tablet Take 1 tablet (81 mg total) by mouth daily.  Skipper Cliche SALINE NASAL DROPS NA Place 2 sprays into both nostrils daily.  . Camphor-Eucalyptus-Menthol (VICKS VAPORUB EX) Apply 1 application topically daily.  . clobetasol ointment (TEMOVATE) AB-123456789 % Apply 1 application topically 2 (two) times daily. Apply to affected area for two weeks then once a day for 2 weeks.  . fluticasone (FLONASE) 50 MCG/ACT nasal spray Place 1 spray into the nose 2 (two) times daily as needed for rhinitis.  [name] levothyroxine (SYNTHROID, LEVOTHROID) 175 MCG tablet take 1 tablet by mouth once  daily EXCEPT ON WEDNESDAY TAKE 1AND1/2 tablets  . loratadine (CLARITIN) 10 MG tablet Take 20 mg by mouth as needed for allergies.  . multivitamin (RENA-VIT) TABS tablet take 1 tablet by mouth daily  . propranolol (INDERAL) 40 MG tablet take 1 tablet by mouth once daily ON TUES, THURS, SAT, AND SUN. AND TWICE DAILY MON., WED, AND FRI.  [name] propranolol (INDERAL) 40 MG tablet Take 1 / 2 tablet once daily on Monday, Wednesday, and Friday (dialysis day)  and take 1/ 2 tablet  twice a day on Sunday, Tuesday, Thursday, and Saturday.  . sevelamer carbonate (RENVELA) 800 MG tablet Take 800 mg by mouth 3 (three) times daily with meals.  . verapamil (CALAN-SR) 120 MG CR tablet Take 1 tablet (120 mg total) by mouth daily.  . [DISCONTINUED] nystatin (MYCOSTATIN/NYSTOP) 100000 UNIT/GM POWD Apply 1 g topically 2 (two) times daily. (Patient not taking: Reported on 05/19/2015)  . [DISCONTINUED] traMADol (ULTRAM) 50 MG tablet Take 1 tablet (50 mg total) by mouth every 8 (eight) hours as needed. (Patient not taking: Reported on 05/19/2015)   No facility-administered encounter medications on file as of 05/19/2015.    Allergies  Allergen Reactions  . Cefaclor Rash  . Naproxen Rash  . Enalapril Maleate Cough  . Metoprolol Tartrate Rash    On legs    ROS Review of Systems As per HPI PE; Blood pressure 123/73, pulse 65, temperature 98.2 F (36.8 C), temperature source Oral, resp. rate 16, height 5' 5.5" (1.664 m), weight 219 lb (99.338 kg), SpO2 96 %. Gen: Alert, well appearing.  Patient is oriented to person, place, time, and situation. [id number]: no injection, icteris, swelling, or exudate.  EOMI, PERRLA. Mouth: lips without lesion/swelling.  Oral mucosa pink and moist. Oropharynx without erythema, exudate, or swelling.  CV: RRR, with to-and fro- type murmur, diastolic component seems louder. Chest is clear, no wheezing or rales. Normal symmetric air entry throughout both lung fields. No chest wall  deformities or tenderness. ABD: soft, NT/ND BACK: mild TTP in paraspinous soft tissues of lumbar spine on R.  Question of CVA TTP on R, no actual flank tenderness or side/abd tenderness. Right upper arm fistula popping out in 3 distinct places, good thrill present/palpable.  Pertinent labs:  None today  ASSESSMENT AND PLAN:   Transfer pt:  1) R Lumbar musculoskeletal pain, question of CVA tenderness, but no illness c/w pyelonephritis is present. Will do urinalysis w/micro, send urine  for c/s.  If blood present but normal urine clx, will do renal u/s. Pt is worried about recurrence of her kidney cancer.  2) HTN: The current medical regimen is effective;  continue present plan and medications.  3) PSVT: propranolol as per cardiologist--per pt's report today.  4) CRI/end stage, on hemodialysis M/W/F--labs and f/u via Dr. [name].  An After Visit Summary was printed and given to the patient.  Return in about 4 weeks (around 06/16/2015) for f/u R flank pain.

## 2015-05-19 NOTE — Progress Notes (Signed)
Pre visit review using our clinic review tool, if applicable. No additional management support is needed unless otherwise documented below in the visit note. 

## 2015-05-20 DIAGNOSIS — D689 Coagulation defect, unspecified: Secondary | ICD-10-CM | POA: Diagnosis not present

## 2015-05-20 DIAGNOSIS — N186 End stage renal disease: Secondary | ICD-10-CM | POA: Diagnosis not present

## 2015-05-20 DIAGNOSIS — D509 Iron deficiency anemia, unspecified: Secondary | ICD-10-CM | POA: Diagnosis not present

## 2015-05-20 DIAGNOSIS — N2581 Secondary hyperparathyroidism of renal origin: Secondary | ICD-10-CM | POA: Diagnosis not present

## 2015-05-20 DIAGNOSIS — D631 Anemia in chronic kidney disease: Secondary | ICD-10-CM | POA: Diagnosis not present

## 2015-05-20 LAB — CULTURE, URINE COMPREHENSIVE

## 2015-05-21 ENCOUNTER — Other Ambulatory Visit: Payer: Self-pay | Admitting: Family Medicine

## 2015-05-21 LAB — URINE CULTURE: Colony Count: 25000

## 2015-05-21 MED ORDER — CIPROFLOXACIN HCL 250 MG PO TABS: ORAL_TABLET | ORAL | Status: DC

## 2015-05-22 DIAGNOSIS — N2581 Secondary hyperparathyroidism of renal origin: Secondary | ICD-10-CM | POA: Diagnosis not present

## 2015-05-22 DIAGNOSIS — D689 Coagulation defect, unspecified: Secondary | ICD-10-CM | POA: Diagnosis not present

## 2015-05-22 DIAGNOSIS — D509 Iron deficiency anemia, unspecified: Secondary | ICD-10-CM | POA: Diagnosis not present

## 2015-05-22 DIAGNOSIS — N186 End stage renal disease: Secondary | ICD-10-CM | POA: Diagnosis not present

## 2015-05-22 DIAGNOSIS — D631 Anemia in chronic kidney disease: Secondary | ICD-10-CM | POA: Diagnosis not present

## 2015-05-25 DIAGNOSIS — N186 End stage renal disease: Secondary | ICD-10-CM | POA: Diagnosis not present

## 2015-05-25 DIAGNOSIS — N2581 Secondary hyperparathyroidism of renal origin: Secondary | ICD-10-CM | POA: Diagnosis not present

## 2015-05-25 DIAGNOSIS — D631 Anemia in chronic kidney disease: Secondary | ICD-10-CM | POA: Diagnosis not present

## 2015-05-25 DIAGNOSIS — D689 Coagulation defect, unspecified: Secondary | ICD-10-CM | POA: Diagnosis not present

## 2015-05-25 DIAGNOSIS — D509 Iron deficiency anemia, unspecified: Secondary | ICD-10-CM | POA: Diagnosis not present

## 2015-05-27 DIAGNOSIS — D689 Coagulation defect, unspecified: Secondary | ICD-10-CM | POA: Diagnosis not present

## 2015-05-27 DIAGNOSIS — N186 End stage renal disease: Secondary | ICD-10-CM | POA: Diagnosis not present

## 2015-05-27 DIAGNOSIS — D631 Anemia in chronic kidney disease: Secondary | ICD-10-CM | POA: Diagnosis not present

## 2015-05-27 DIAGNOSIS — N2581 Secondary hyperparathyroidism of renal origin: Secondary | ICD-10-CM | POA: Diagnosis not present

## 2015-05-27 DIAGNOSIS — D509 Iron deficiency anemia, unspecified: Secondary | ICD-10-CM | POA: Diagnosis not present

## 2015-05-28 ENCOUNTER — Other Ambulatory Visit: Payer: Self-pay | Admitting: Family Medicine

## 2015-05-28 DIAGNOSIS — Z139 Encounter for screening, unspecified: Secondary | ICD-10-CM

## 2015-05-29 DIAGNOSIS — D631 Anemia in chronic kidney disease: Secondary | ICD-10-CM | POA: Diagnosis not present

## 2015-05-29 DIAGNOSIS — D689 Coagulation defect, unspecified: Secondary | ICD-10-CM | POA: Diagnosis not present

## 2015-05-29 DIAGNOSIS — N2581 Secondary hyperparathyroidism of renal origin: Secondary | ICD-10-CM | POA: Diagnosis not present

## 2015-05-29 DIAGNOSIS — D509 Iron deficiency anemia, unspecified: Secondary | ICD-10-CM | POA: Diagnosis not present

## 2015-05-29 DIAGNOSIS — N186 End stage renal disease: Secondary | ICD-10-CM | POA: Diagnosis not present

## 2015-06-01 DIAGNOSIS — L821 Other seborrheic keratosis: Secondary | ICD-10-CM | POA: Diagnosis not present

## 2015-06-01 DIAGNOSIS — L570 Actinic keratosis: Secondary | ICD-10-CM | POA: Diagnosis not present

## 2015-06-01 DIAGNOSIS — N186 End stage renal disease: Secondary | ICD-10-CM | POA: Diagnosis not present

## 2015-06-01 DIAGNOSIS — D689 Coagulation defect, unspecified: Secondary | ICD-10-CM | POA: Diagnosis not present

## 2015-06-01 DIAGNOSIS — D631 Anemia in chronic kidney disease: Secondary | ICD-10-CM | POA: Diagnosis not present

## 2015-06-01 DIAGNOSIS — D509 Iron deficiency anemia, unspecified: Secondary | ICD-10-CM | POA: Diagnosis not present

## 2015-06-01 DIAGNOSIS — N2581 Secondary hyperparathyroidism of renal origin: Secondary | ICD-10-CM | POA: Diagnosis not present

## 2015-06-03 DIAGNOSIS — D631 Anemia in chronic kidney disease: Secondary | ICD-10-CM | POA: Diagnosis not present

## 2015-06-03 DIAGNOSIS — D689 Coagulation defect, unspecified: Secondary | ICD-10-CM | POA: Diagnosis not present

## 2015-06-03 DIAGNOSIS — D509 Iron deficiency anemia, unspecified: Secondary | ICD-10-CM | POA: Diagnosis not present

## 2015-06-03 DIAGNOSIS — N186 End stage renal disease: Secondary | ICD-10-CM | POA: Diagnosis not present

## 2015-06-03 DIAGNOSIS — N2581 Secondary hyperparathyroidism of renal origin: Secondary | ICD-10-CM | POA: Diagnosis not present

## 2015-06-03 DIAGNOSIS — Z7682 Awaiting organ transplant status: Secondary | ICD-10-CM | POA: Diagnosis not present

## 2015-06-04 ENCOUNTER — Ambulatory Visit (INDEPENDENT_AMBULATORY_CARE_PROVIDER_SITE_OTHER): Payer: Medicare Other

## 2015-06-04 DIAGNOSIS — Z1231 Encounter for screening mammogram for malignant neoplasm of breast: Secondary | ICD-10-CM | POA: Diagnosis not present

## 2015-06-04 DIAGNOSIS — Z139 Encounter for screening, unspecified: Secondary | ICD-10-CM

## 2015-06-04 LAB — HM MAMMOGRAPHY

## 2015-06-04 IMAGING — MG MM DIGITAL SCREENING
7 series · 7 of 7 positions shown · non-contrast
Comparison: Previous exam(s).

CLINICAL DATA: Screening.

EXAM:
DIGITAL SCREENING BILATERAL MAMMOGRAM WITH CAD

[R CC]
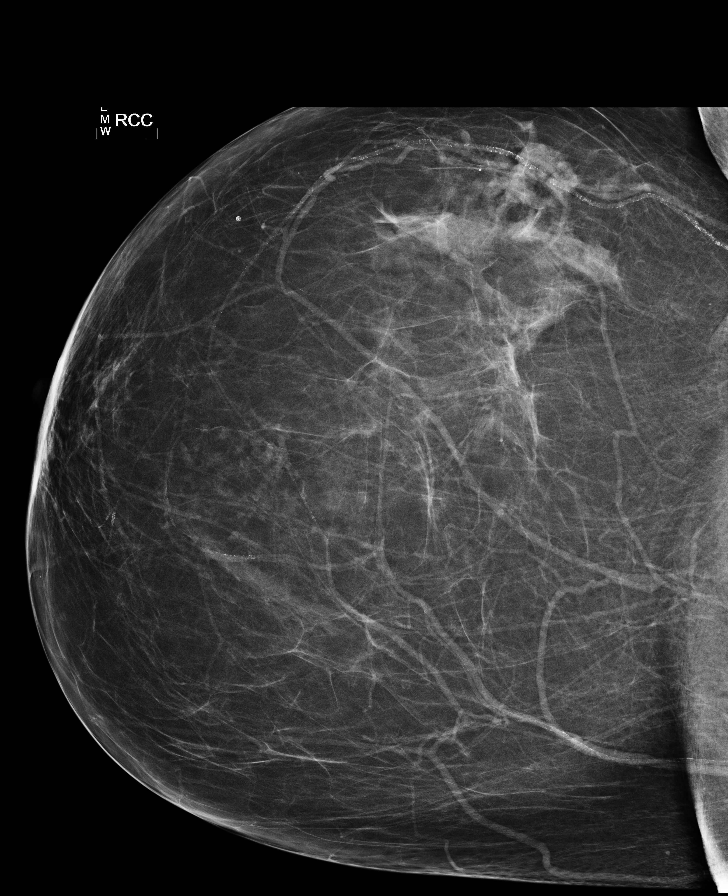

[L CC]
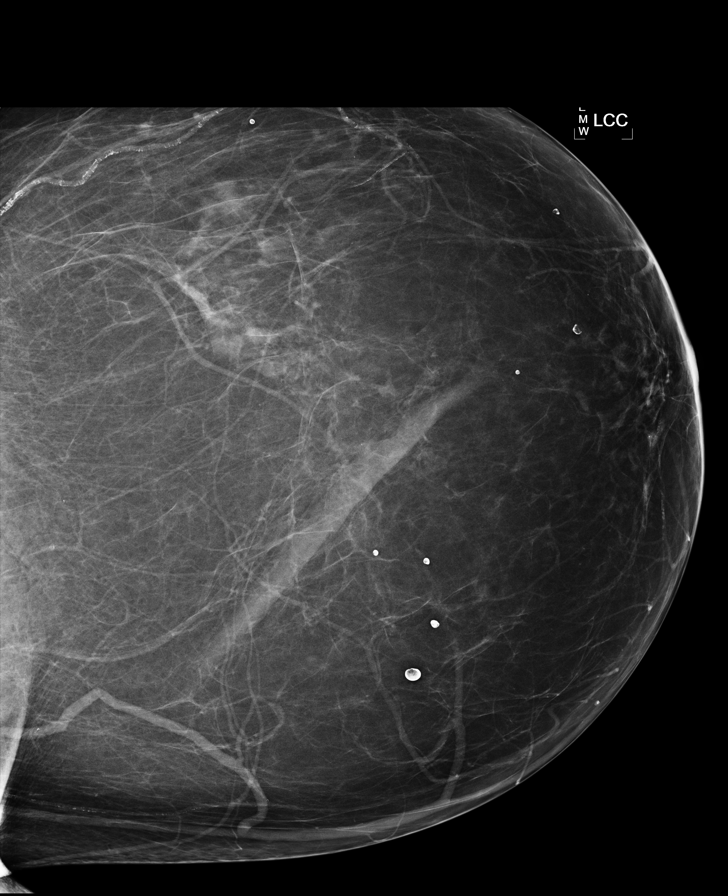

[L MLO (1 of 2)]
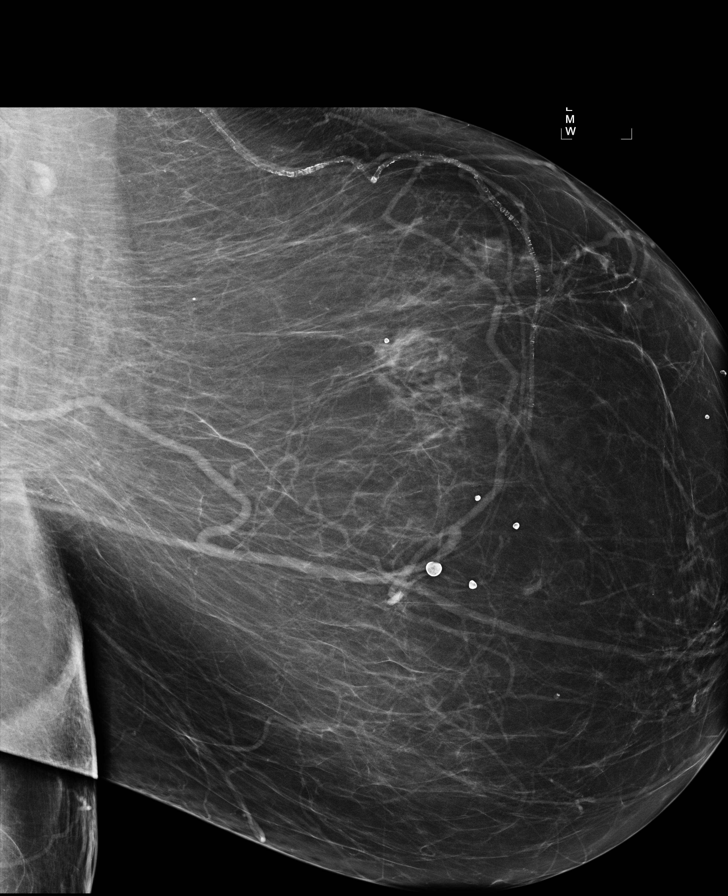

[R MLO (1 of 2)]
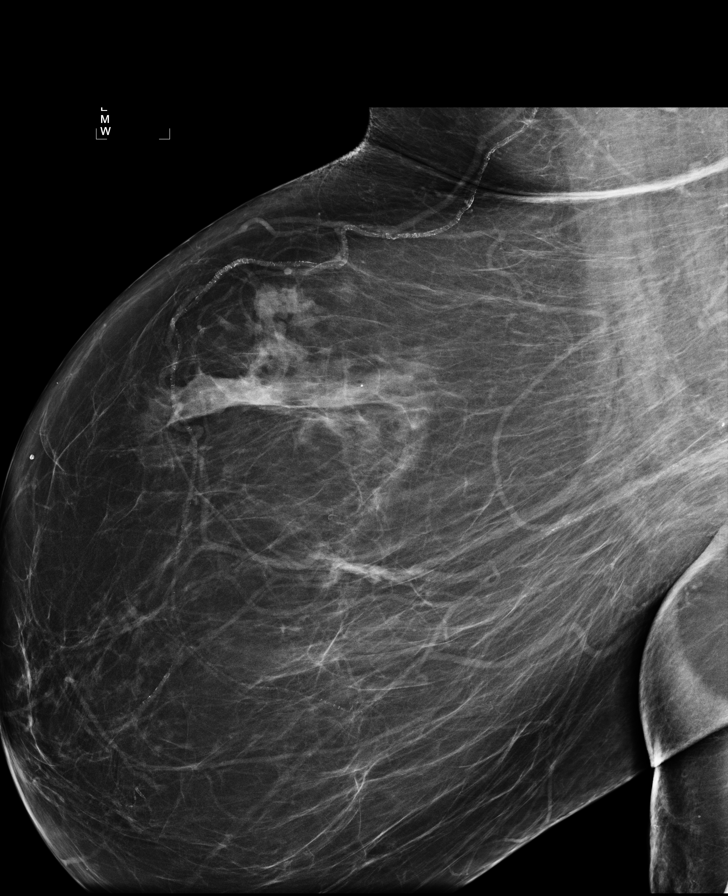

[L MLO (2 of 2)]
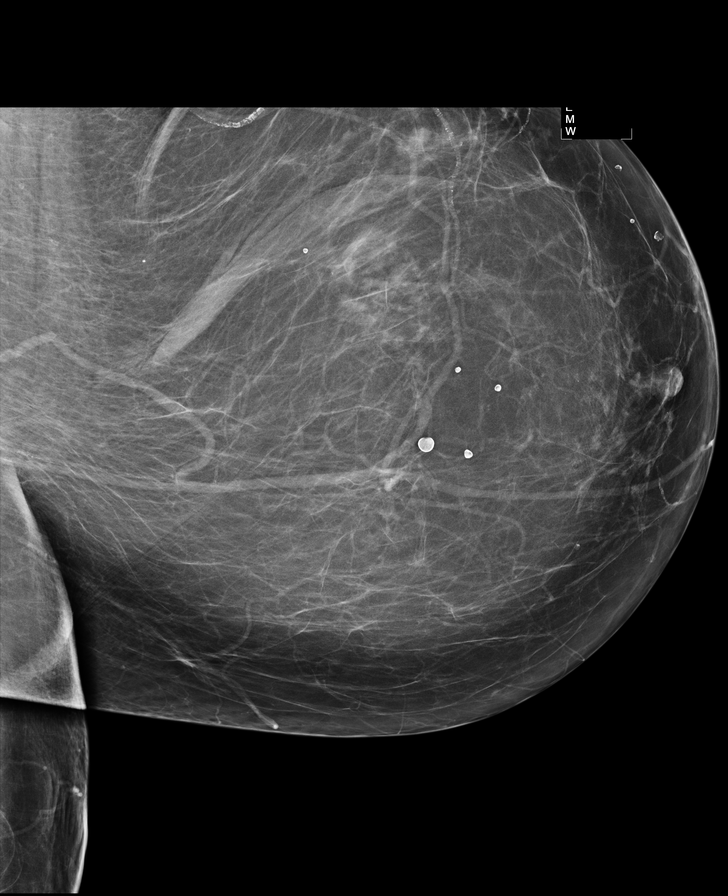

[L CV]
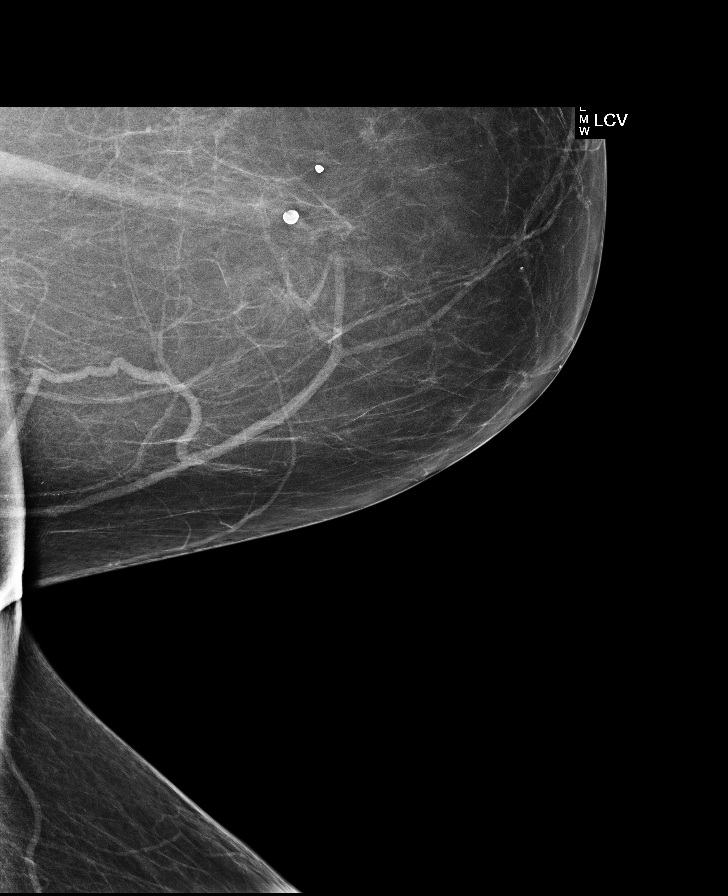

[R MLO (2 of 2)]
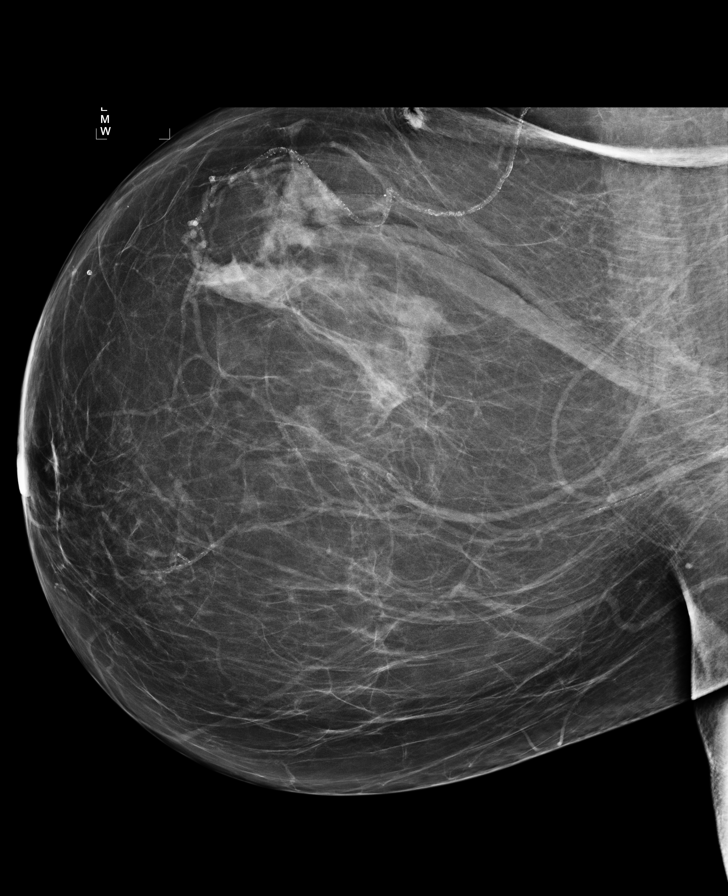

[7 of 7 positions shown; findings below may reference images not displayed]

ACR Breast Density Category b: There are scattered areas of
fibroglandular density.
FINDINGS: There are no findings suspicious for malignancy. Images were
processed with CAD.
IMPRESSION: No mammographic evidence of malignancy. A result letter of this
screening mammogram will be mailed directly to the patient.

RECOMMENDATION:
Screening mammogram in one year. (Code:[US])

BI-RADS CATEGORY  1: Negative.

## 2015-06-05 DIAGNOSIS — N186 End stage renal disease: Secondary | ICD-10-CM | POA: Diagnosis not present

## 2015-06-05 DIAGNOSIS — N2581 Secondary hyperparathyroidism of renal origin: Secondary | ICD-10-CM | POA: Diagnosis not present

## 2015-06-05 DIAGNOSIS — D509 Iron deficiency anemia, unspecified: Secondary | ICD-10-CM | POA: Diagnosis not present

## 2015-06-05 DIAGNOSIS — D631 Anemia in chronic kidney disease: Secondary | ICD-10-CM | POA: Diagnosis not present

## 2015-06-05 DIAGNOSIS — D689 Coagulation defect, unspecified: Secondary | ICD-10-CM | POA: Diagnosis not present

## 2015-06-08 DIAGNOSIS — N033 Chronic nephritic syndrome with diffuse mesangial proliferative glomerulonephritis: Secondary | ICD-10-CM | POA: Diagnosis not present

## 2015-06-08 DIAGNOSIS — D689 Coagulation defect, unspecified: Secondary | ICD-10-CM | POA: Diagnosis not present

## 2015-06-08 DIAGNOSIS — N2581 Secondary hyperparathyroidism of renal origin: Secondary | ICD-10-CM | POA: Diagnosis not present

## 2015-06-08 DIAGNOSIS — Z992 Dependence on renal dialysis: Secondary | ICD-10-CM | POA: Diagnosis not present

## 2015-06-08 DIAGNOSIS — N186 End stage renal disease: Secondary | ICD-10-CM | POA: Diagnosis not present

## 2015-06-08 DIAGNOSIS — D509 Iron deficiency anemia, unspecified: Secondary | ICD-10-CM | POA: Diagnosis not present

## 2015-06-08 DIAGNOSIS — D631 Anemia in chronic kidney disease: Secondary | ICD-10-CM | POA: Diagnosis not present

## 2015-06-09 DIAGNOSIS — L438 Other lichen planus: Secondary | ICD-10-CM | POA: Diagnosis not present

## 2015-06-10 DIAGNOSIS — D509 Iron deficiency anemia, unspecified: Secondary | ICD-10-CM | POA: Diagnosis not present

## 2015-06-10 DIAGNOSIS — D631 Anemia in chronic kidney disease: Secondary | ICD-10-CM | POA: Diagnosis not present

## 2015-06-10 DIAGNOSIS — N186 End stage renal disease: Secondary | ICD-10-CM | POA: Diagnosis not present

## 2015-06-10 DIAGNOSIS — D689 Coagulation defect, unspecified: Secondary | ICD-10-CM | POA: Diagnosis not present

## 2015-06-10 DIAGNOSIS — N2581 Secondary hyperparathyroidism of renal origin: Secondary | ICD-10-CM | POA: Diagnosis not present

## 2015-06-12 DIAGNOSIS — D509 Iron deficiency anemia, unspecified: Secondary | ICD-10-CM | POA: Diagnosis not present

## 2015-06-12 DIAGNOSIS — D631 Anemia in chronic kidney disease: Secondary | ICD-10-CM | POA: Diagnosis not present

## 2015-06-12 DIAGNOSIS — N186 End stage renal disease: Secondary | ICD-10-CM | POA: Diagnosis not present

## 2015-06-12 DIAGNOSIS — D689 Coagulation defect, unspecified: Secondary | ICD-10-CM | POA: Diagnosis not present

## 2015-06-12 DIAGNOSIS — N2581 Secondary hyperparathyroidism of renal origin: Secondary | ICD-10-CM | POA: Diagnosis not present

## 2015-06-15 DIAGNOSIS — N186 End stage renal disease: Secondary | ICD-10-CM | POA: Diagnosis not present

## 2015-06-15 DIAGNOSIS — D689 Coagulation defect, unspecified: Secondary | ICD-10-CM | POA: Diagnosis not present

## 2015-06-15 DIAGNOSIS — D509 Iron deficiency anemia, unspecified: Secondary | ICD-10-CM | POA: Diagnosis not present

## 2015-06-15 DIAGNOSIS — N2581 Secondary hyperparathyroidism of renal origin: Secondary | ICD-10-CM | POA: Diagnosis not present

## 2015-06-15 DIAGNOSIS — D631 Anemia in chronic kidney disease: Secondary | ICD-10-CM | POA: Diagnosis not present

## 2015-06-17 DIAGNOSIS — N2581 Secondary hyperparathyroidism of renal origin: Secondary | ICD-10-CM | POA: Diagnosis not present

## 2015-06-17 DIAGNOSIS — D509 Iron deficiency anemia, unspecified: Secondary | ICD-10-CM | POA: Diagnosis not present

## 2015-06-17 DIAGNOSIS — D689 Coagulation defect, unspecified: Secondary | ICD-10-CM | POA: Diagnosis not present

## 2015-06-17 DIAGNOSIS — N186 End stage renal disease: Secondary | ICD-10-CM | POA: Diagnosis not present

## 2015-06-17 DIAGNOSIS — D631 Anemia in chronic kidney disease: Secondary | ICD-10-CM | POA: Diagnosis not present

## 2015-06-19 DIAGNOSIS — D689 Coagulation defect, unspecified: Secondary | ICD-10-CM | POA: Diagnosis not present

## 2015-06-19 DIAGNOSIS — N186 End stage renal disease: Secondary | ICD-10-CM | POA: Diagnosis not present

## 2015-06-19 DIAGNOSIS — D509 Iron deficiency anemia, unspecified: Secondary | ICD-10-CM | POA: Diagnosis not present

## 2015-06-19 DIAGNOSIS — D631 Anemia in chronic kidney disease: Secondary | ICD-10-CM | POA: Diagnosis not present

## 2015-06-19 DIAGNOSIS — N2581 Secondary hyperparathyroidism of renal origin: Secondary | ICD-10-CM | POA: Diagnosis not present

## 2015-06-22 DIAGNOSIS — N2581 Secondary hyperparathyroidism of renal origin: Secondary | ICD-10-CM | POA: Diagnosis not present

## 2015-06-22 DIAGNOSIS — N186 End stage renal disease: Secondary | ICD-10-CM | POA: Diagnosis not present

## 2015-06-22 DIAGNOSIS — D509 Iron deficiency anemia, unspecified: Secondary | ICD-10-CM | POA: Diagnosis not present

## 2015-06-22 DIAGNOSIS — D689 Coagulation defect, unspecified: Secondary | ICD-10-CM | POA: Diagnosis not present

## 2015-06-22 DIAGNOSIS — D631 Anemia in chronic kidney disease: Secondary | ICD-10-CM | POA: Diagnosis not present

## 2015-06-24 DIAGNOSIS — N2581 Secondary hyperparathyroidism of renal origin: Secondary | ICD-10-CM | POA: Diagnosis not present

## 2015-06-24 DIAGNOSIS — D509 Iron deficiency anemia, unspecified: Secondary | ICD-10-CM | POA: Diagnosis not present

## 2015-06-24 DIAGNOSIS — N186 End stage renal disease: Secondary | ICD-10-CM | POA: Diagnosis not present

## 2015-06-24 DIAGNOSIS — D689 Coagulation defect, unspecified: Secondary | ICD-10-CM | POA: Diagnosis not present

## 2015-06-24 DIAGNOSIS — D631 Anemia in chronic kidney disease: Secondary | ICD-10-CM | POA: Diagnosis not present

## 2015-06-25 ENCOUNTER — Ambulatory Visit: Payer: Medicare Other | Admitting: Family Medicine

## 2015-06-27 DIAGNOSIS — D689 Coagulation defect, unspecified: Secondary | ICD-10-CM | POA: Diagnosis not present

## 2015-06-27 DIAGNOSIS — N2581 Secondary hyperparathyroidism of renal origin: Secondary | ICD-10-CM | POA: Diagnosis not present

## 2015-06-27 DIAGNOSIS — D509 Iron deficiency anemia, unspecified: Secondary | ICD-10-CM | POA: Diagnosis not present

## 2015-06-27 DIAGNOSIS — N186 End stage renal disease: Secondary | ICD-10-CM | POA: Diagnosis not present

## 2015-06-27 DIAGNOSIS — D631 Anemia in chronic kidney disease: Secondary | ICD-10-CM | POA: Diagnosis not present

## 2015-06-29 DIAGNOSIS — N186 End stage renal disease: Secondary | ICD-10-CM | POA: Diagnosis not present

## 2015-06-29 DIAGNOSIS — D509 Iron deficiency anemia, unspecified: Secondary | ICD-10-CM | POA: Diagnosis not present

## 2015-06-29 DIAGNOSIS — N2581 Secondary hyperparathyroidism of renal origin: Secondary | ICD-10-CM | POA: Diagnosis not present

## 2015-06-29 DIAGNOSIS — D631 Anemia in chronic kidney disease: Secondary | ICD-10-CM | POA: Diagnosis not present

## 2015-06-29 DIAGNOSIS — D689 Coagulation defect, unspecified: Secondary | ICD-10-CM | POA: Diagnosis not present

## 2015-06-30 DIAGNOSIS — N186 End stage renal disease: Secondary | ICD-10-CM | POA: Diagnosis not present

## 2015-06-30 DIAGNOSIS — D509 Iron deficiency anemia, unspecified: Secondary | ICD-10-CM | POA: Diagnosis not present

## 2015-06-30 DIAGNOSIS — D631 Anemia in chronic kidney disease: Secondary | ICD-10-CM | POA: Diagnosis not present

## 2015-06-30 DIAGNOSIS — D689 Coagulation defect, unspecified: Secondary | ICD-10-CM | POA: Diagnosis not present

## 2015-06-30 DIAGNOSIS — N2581 Secondary hyperparathyroidism of renal origin: Secondary | ICD-10-CM | POA: Diagnosis not present

## 2015-07-03 DIAGNOSIS — Z992 Dependence on renal dialysis: Secondary | ICD-10-CM | POA: Diagnosis not present

## 2015-07-03 DIAGNOSIS — N186 End stage renal disease: Secondary | ICD-10-CM | POA: Diagnosis not present

## 2015-07-03 DIAGNOSIS — N2581 Secondary hyperparathyroidism of renal origin: Secondary | ICD-10-CM | POA: Diagnosis not present

## 2015-07-06 DIAGNOSIS — N186 End stage renal disease: Secondary | ICD-10-CM | POA: Diagnosis not present

## 2015-07-06 DIAGNOSIS — D631 Anemia in chronic kidney disease: Secondary | ICD-10-CM | POA: Diagnosis not present

## 2015-07-06 DIAGNOSIS — D689 Coagulation defect, unspecified: Secondary | ICD-10-CM | POA: Diagnosis not present

## 2015-07-06 DIAGNOSIS — N2581 Secondary hyperparathyroidism of renal origin: Secondary | ICD-10-CM | POA: Diagnosis not present

## 2015-07-06 DIAGNOSIS — D509 Iron deficiency anemia, unspecified: Secondary | ICD-10-CM | POA: Diagnosis not present

## 2015-07-07 ENCOUNTER — Ambulatory Visit (INDEPENDENT_AMBULATORY_CARE_PROVIDER_SITE_OTHER): Payer: Medicare Other | Admitting: Family Medicine

## 2015-07-07 ENCOUNTER — Encounter: Payer: Self-pay | Admitting: Family Medicine

## 2015-07-07 VITALS — BP 122/73 | HR 61 | Temp 98.3°F | Resp 16 | Ht 65.5 in | Wt 218.0 lb

## 2015-07-07 DIAGNOSIS — N186 End stage renal disease: Secondary | ICD-10-CM

## 2015-07-07 DIAGNOSIS — M546 Pain in thoracic spine: Secondary | ICD-10-CM

## 2015-07-07 DIAGNOSIS — R109 Unspecified abdominal pain: Secondary | ICD-10-CM | POA: Diagnosis not present

## 2015-07-07 DIAGNOSIS — R3129 Other microscopic hematuria: Secondary | ICD-10-CM | POA: Diagnosis not present

## 2015-07-07 DIAGNOSIS — E039 Hypothyroidism, unspecified: Secondary | ICD-10-CM | POA: Diagnosis not present

## 2015-07-07 LAB — URINALYSIS, ROUTINE W REFLEX MICROSCOPIC
BILIRUBIN URINE: NEGATIVE
Ketones, ur: NEGATIVE
LEUKOCYTES UA: NEGATIVE
Nitrite: NEGATIVE
Specific Gravity, Urine: 1.015 (ref 1.000–1.030)
TOTAL PROTEIN, URINE-UPE24: 100 — AB
UROBILINOGEN UA: 0.2 (ref 0.0–1.0)
Urine Glucose: NEGATIVE
pH: 8.5 — AB (ref 5.0–8.0)

## 2015-07-07 NOTE — Progress Notes (Signed)
Pre visit review using our clinic review tool, if applicable. No additional management support is needed unless otherwise documented below in the visit note. 

## 2015-07-07 NOTE — Progress Notes (Signed)
OFFICE VISIT  07/17/2015   CC:  Chief Complaint  Patient presents with  . Follow-up    DM and discuss anticoaqulation. Pt is not fasting.   This is incorrect: pt here for f/u R side/flank pain.  HPI:    Patient is a 69 y.o. Caucasian female who presents for 2 mo f/u R lower torso pain in posterior aspect, near flank: still with constant soreness, some movements make it worse.  No dysuria, hematuria, urinary urgency, or frequency.  No signif constipation.  Eating doesn't affect the pain. No radiation of the pain.  No rash has come up.    Past Medical History  Diagnosis Date  . Hypertension   . Hypothyroidism   . Gout   . Arthritis   . Anemia     r/t kidney function- receives Procrit  . Cancer ([place])     kidney  . FSGS (focal segmental glomerulosclerosis)     right kidney;  . Osteopenia   . Diverticulosis     a. 05/2012 colonoscopy  . CKD (chronic kidney disease)     followed by Dr. [name].  . SVT (supraventricular tachycardia) ([place])   . GERD (gastroesophageal reflux disease)   . Hiatal hernia     Past Surgical History  Procedure Laterality Date  . Total abdominal hysterectomy w/ bilateral salpingoophorectomy      fibroids  . Nephrectomy  1993    for malignancy- left  . Colonoscopy with polypectomy      Dr Henrene Pastor  . Av fistula placement Right     Hemodyalisis initiated 2014; schedule is M/W/F [place]  . Renal biopsy      right  . Colonoscopy  2003; 05/2012    diverticulosis, no polyps.  Recall 10 yrs  . Tee without cardioversion N/A 08/27/2013    Procedure: TRANSESOPHAGEAL ECHOCARDIOGRAM (TEE);  Surgeon: [name], MD;  Location: [facility] ENDOSCOPY;  Service: Cardiovascular;  Laterality: N/A;  . Vulva / perineum biopsy  2015    Outpatient Prescriptions Prior to Visit  Medication Sig Dispense Refill  . acetaminophen (TYLENOL) 500 MG tablet Take 500-750 mg by mouth every 6 (six) hours as needed for pain.    [name] aspirin EC 81 MG EC tablet Take 1 tablet (81  mg total) by mouth daily.    Skipper Cliche SALINE NASAL DROPS NA Place 2 sprays into both nostrils daily.    . Camphor-Eucalyptus-Menthol (VICKS VAPORUB EX) Apply 1 application topically daily.    . clobetasol ointment (TEMOVATE) AB-[id number] % Apply 1 application topically 2 (two) times daily. Apply to affected area for two weeks then once a day for 2 weeks. 30 g 2  . fluticasone (FLONASE) 50 MCG/ACT nasal spray Place 1 spray into the nose 2 (two) times daily as needed for rhinitis. 16 g 2  . levothyroxine (SYNTHROID, LEVOTHROID) 175 MCG tablet take 1 tablet by mouth once daily EXCEPT ON WEDNESDAY TAKE 1AND1/2 tablets 32 tablet 5  . loratadine (CLARITIN) 10 MG tablet Take 20 mg by mouth as needed for allergies.    . multivitamin (RENA-VIT) TABS tablet take 1 tablet by mouth daily 30 tablet 5  . propranolol (INDERAL) 40 MG tablet take 1 tablet by mouth once daily ON TUES, THURS, SAT, AND SUN. AND TWICE DAILY MON., WED, AND FRI. 44 tablet 5  . sevelamer carbonate (RENVELA) 800 MG tablet Take 800 mg by mouth 3 (three) times daily with meals.    . verapamil (CALAN-SR) 120 MG CR tablet Take 1 tablet (  120 mg total) by mouth daily. 90 tablet 1  . ciprofloxacin (CIPRO) 250 MG tablet 1 tab po bid x 5d (Patient not taking: Reported on 07/07/2015) 10 tablet 0  . propranolol (INDERAL) 40 MG tablet Take 1 / 2 tablet once daily on Monday, Wednesday, and Friday (dialysis day)  and take 1/ 2 tablet  twice a day on Sunday, Tuesday, Thursday, and Saturday. (Patient not taking: Reported on 07/07/2015) 44 tablet 0   No facility-administered medications prior to visit.    Allergies  Allergen Reactions  . Cefaclor Rash  . Naproxen Rash  . Enalapril Maleate Cough  . Metoprolol Tartrate Rash    On legs    ROS As per HPI  PE: Blood pressure 122/73, pulse 61, temperature 98.3 F (36.8 C), temperature source Oral, resp. rate 16, height 5' 5.5" (1.664 m), weight 218 lb (98.884 kg), SpO2 95 %. Gen: Alert, well appearing.   Patient is oriented to person, place, time, and situation. CV: RRR, no m/r/g.   LUNGS: CTA bilat, nonlabored resps, good aeration in all lung fields. R posterior chest wall in lower thoracic region and R lumbar soft tissue TTP--mild.  No flank or side tenderness.  No bruising or rash.    LABS:   Lab Results  Component Value Date   TSH 20.10* 07/16/2015    Lab Results  Component Value Date   TSH 20.10* 07/16/2015   Lab Results  Component Value Date   WBC 9.3 07/23/2013   HGB 11.1* 07/23/2013   HCT 32.7* 07/23/2013   MCV 96.2 07/23/2013   PLT 235.0 07/23/2013   Lab Results  Component Value Date   CREATININE 3.84* 05/06/2013   BUN 16 05/06/2013   NA 140 05/06/2013   K 3.3* 05/06/2013   CL 97 05/06/2013   CO2 30 05/06/2013   Lab Results  Component Value Date   ALT 21 12/14/2012   AST 24 12/14/2012   ALKPHOS 71 12/14/2012   BILITOT 0.3 12/14/2012   Lab Results  Component Value Date   CHOL 172 07/16/2015   Lab Results  Component Value Date   HDL 46.60 07/16/2015   Lab Results  Component Value Date   LDLCALC 98 07/16/2015   Lab Results  Component Value Date   TRIG 135.0 07/16/2015   Lab Results  Component Value Date   CHOLHDL 4 07/16/2015   IMPRESSION AND PLAN:  R mid/lower back pain, suspect musculoskeletal etiology. Will check UA with reflex microscopy today to see if any findings are present to suggest infection or if large amount of blood present.  At this point, I don't think imaging of pt's kidney is needed.  Pt will return for fasting lipid panel and TSH for hyperlipidemia screening and monitoring of hypothyroidism/meds.  An After Visit Summary was printed and given to the patient.  FOLLOW UP: Return in about 6 months (around 01/04/2016) for o/v 6 mo (30 min f/u chronic),lab visit at pt's earliest convenience for fasting lipid panel and TSH.

## 2015-07-08 ENCOUNTER — Other Ambulatory Visit: Payer: Self-pay | Admitting: *Deleted

## 2015-07-08 DIAGNOSIS — R3129 Other microscopic hematuria: Secondary | ICD-10-CM

## 2015-07-08 DIAGNOSIS — R109 Unspecified abdominal pain: Secondary | ICD-10-CM

## 2015-07-08 DIAGNOSIS — D509 Iron deficiency anemia, unspecified: Secondary | ICD-10-CM | POA: Diagnosis not present

## 2015-07-08 DIAGNOSIS — Z992 Dependence on renal dialysis: Secondary | ICD-10-CM | POA: Diagnosis not present

## 2015-07-08 DIAGNOSIS — D631 Anemia in chronic kidney disease: Secondary | ICD-10-CM | POA: Diagnosis not present

## 2015-07-08 DIAGNOSIS — N186 End stage renal disease: Secondary | ICD-10-CM | POA: Diagnosis not present

## 2015-07-08 DIAGNOSIS — D689 Coagulation defect, unspecified: Secondary | ICD-10-CM | POA: Diagnosis not present

## 2015-07-08 DIAGNOSIS — N033 Chronic nephritic syndrome with diffuse mesangial proliferative glomerulonephritis: Secondary | ICD-10-CM | POA: Diagnosis not present

## 2015-07-08 DIAGNOSIS — N2581 Secondary hyperparathyroidism of renal origin: Secondary | ICD-10-CM | POA: Diagnosis not present

## 2015-07-10 DIAGNOSIS — D689 Coagulation defect, unspecified: Secondary | ICD-10-CM | POA: Diagnosis not present

## 2015-07-10 DIAGNOSIS — N186 End stage renal disease: Secondary | ICD-10-CM | POA: Diagnosis not present

## 2015-07-10 DIAGNOSIS — D509 Iron deficiency anemia, unspecified: Secondary | ICD-10-CM | POA: Diagnosis not present

## 2015-07-10 DIAGNOSIS — N2581 Secondary hyperparathyroidism of renal origin: Secondary | ICD-10-CM | POA: Diagnosis not present

## 2015-07-13 DIAGNOSIS — N186 End stage renal disease: Secondary | ICD-10-CM | POA: Diagnosis not present

## 2015-07-13 DIAGNOSIS — D689 Coagulation defect, unspecified: Secondary | ICD-10-CM | POA: Diagnosis not present

## 2015-07-13 DIAGNOSIS — D509 Iron deficiency anemia, unspecified: Secondary | ICD-10-CM | POA: Diagnosis not present

## 2015-07-13 DIAGNOSIS — N2581 Secondary hyperparathyroidism of renal origin: Secondary | ICD-10-CM | POA: Diagnosis not present

## 2015-07-14 ENCOUNTER — Ambulatory Visit (INDEPENDENT_AMBULATORY_CARE_PROVIDER_SITE_OTHER): Payer: Medicare Other

## 2015-07-14 DIAGNOSIS — M546 Pain in thoracic spine: Secondary | ICD-10-CM | POA: Diagnosis not present

## 2015-07-14 IMAGING — CR DG CHEST 2V
2 series · 2 of 2 positions shown · non-contrast
Comparison: [DATE]

CLINICAL DATA: Posterior thoracic pain on the right for 2-3 months
with no injury

EXAM:
CHEST  2 VIEW

[chest pa]
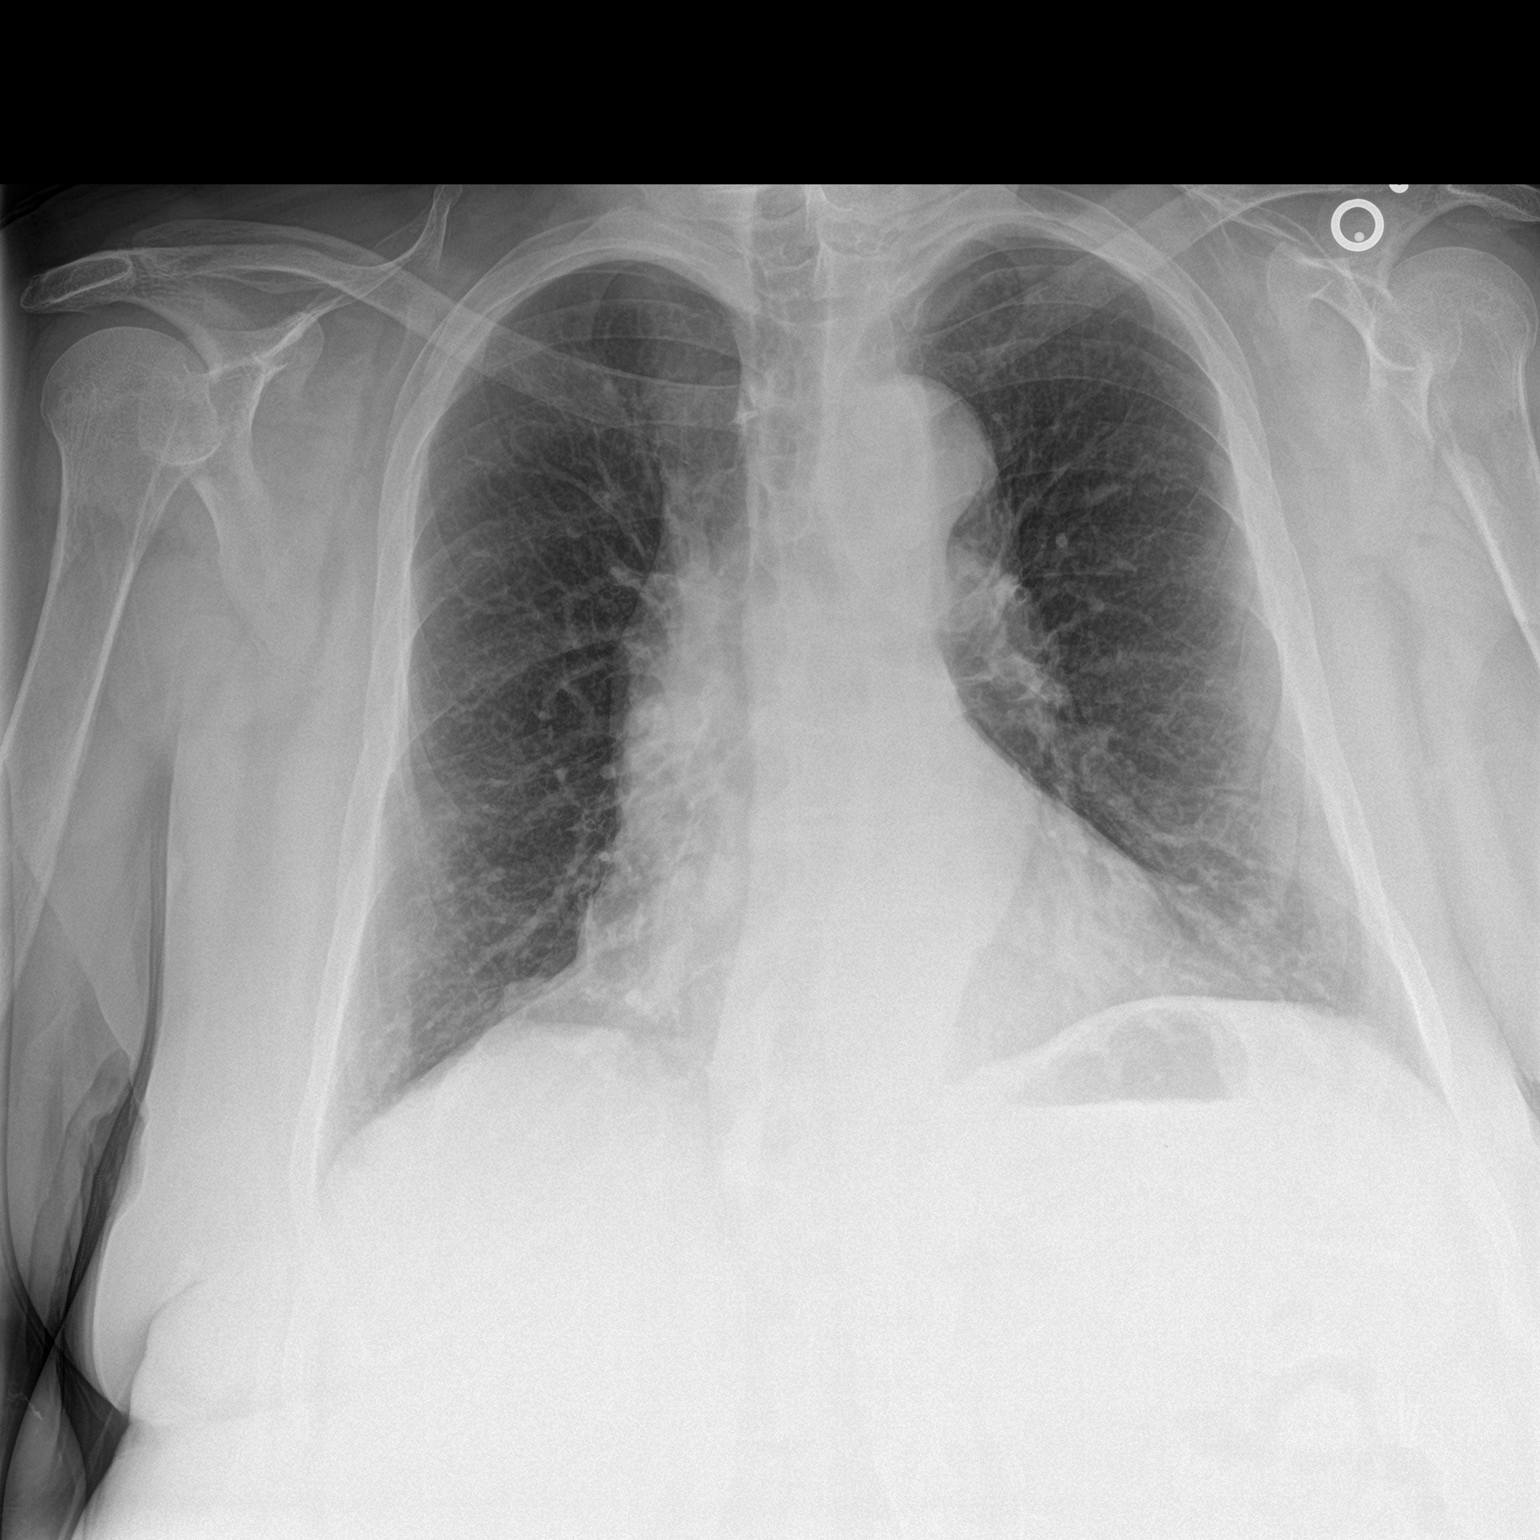

[chest lat]
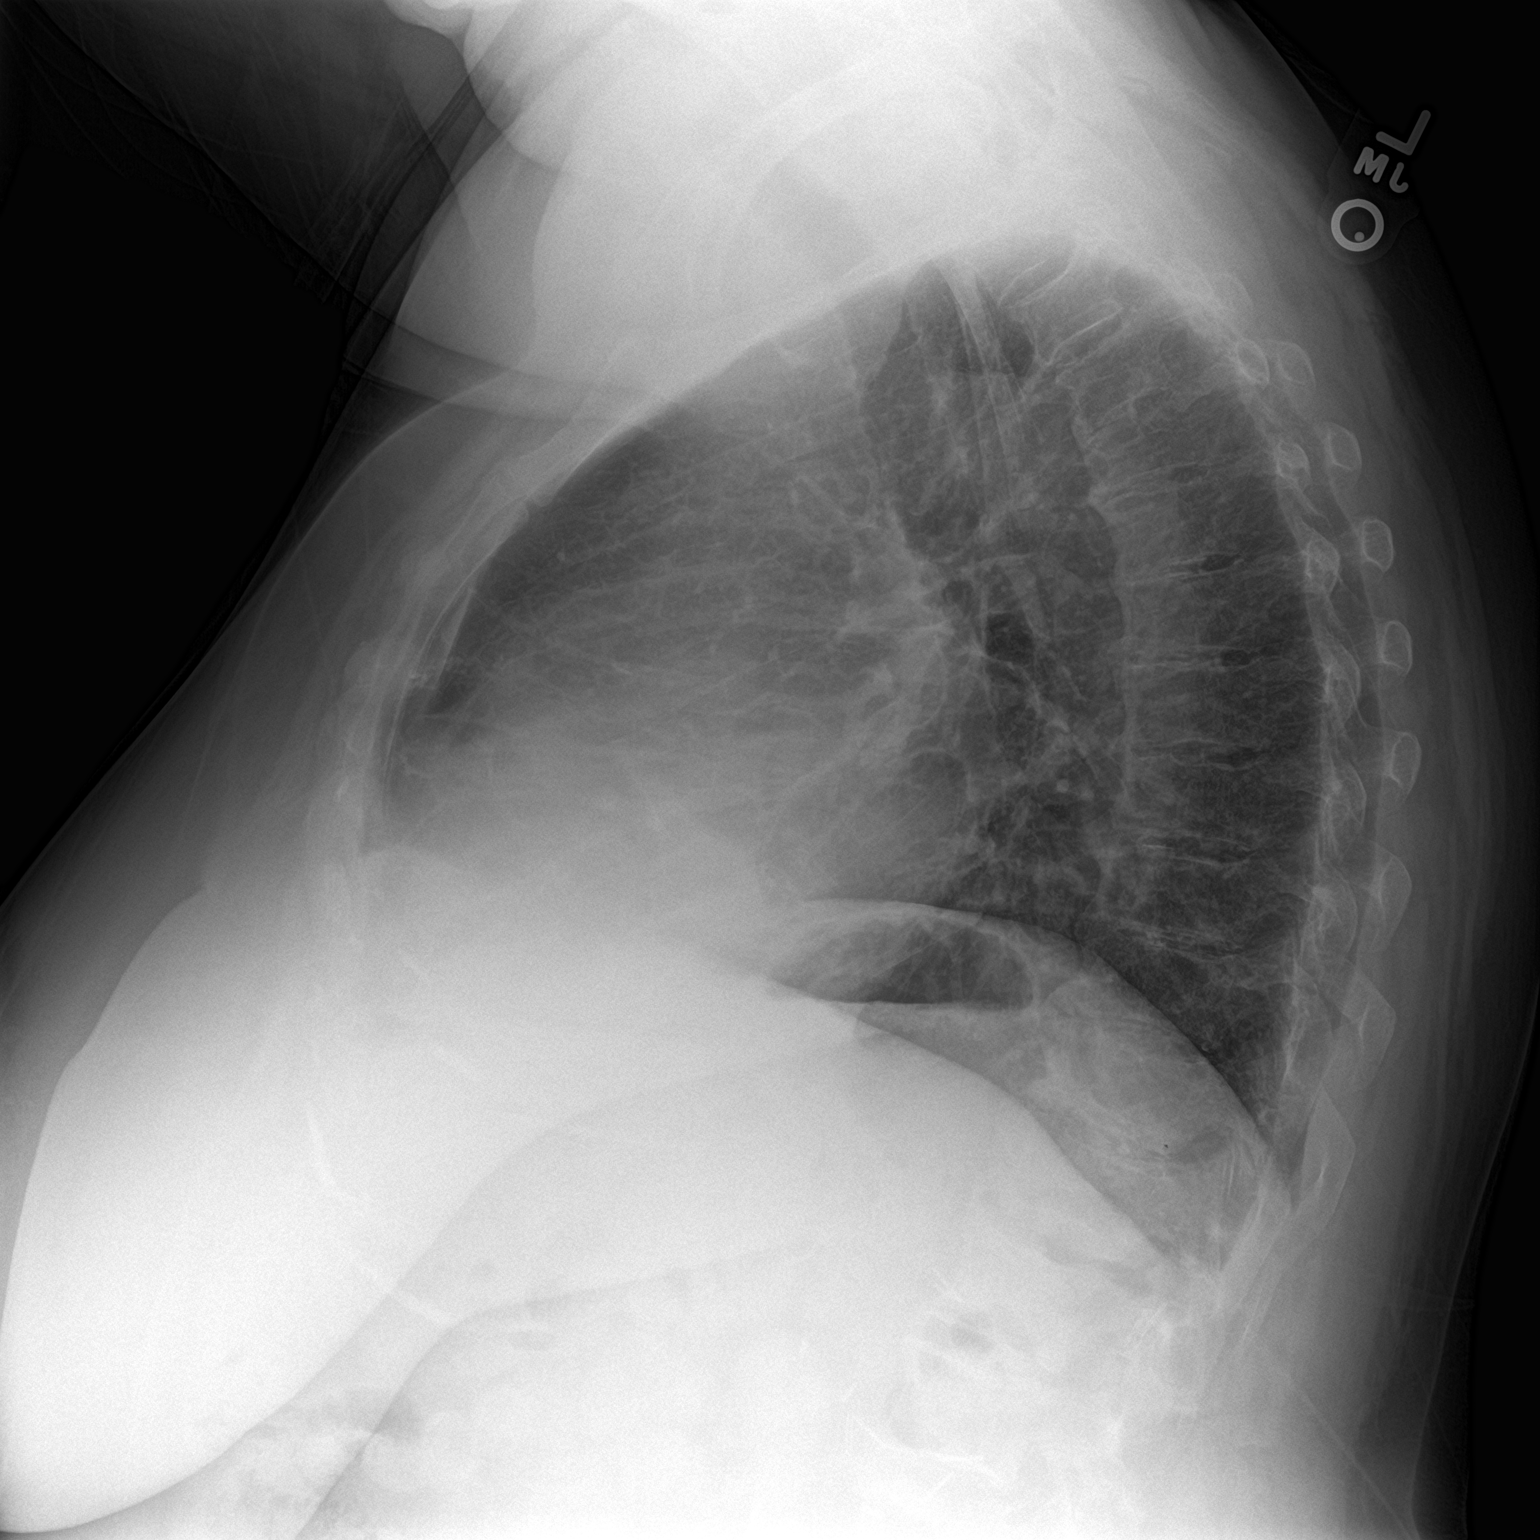

[2 of 2 positions shown; findings below may reference images not displayed]

FINDINGS: Stable mild cardiac enlargement and uncoiling of the aorta. Mild
diffuse interstitial prominence is stable. No consolidation or
effusion.
IMPRESSION: Stable mild nonacute findings with no acute abnormalities.

## 2015-07-15 DIAGNOSIS — D689 Coagulation defect, unspecified: Secondary | ICD-10-CM | POA: Diagnosis not present

## 2015-07-15 DIAGNOSIS — N186 End stage renal disease: Secondary | ICD-10-CM | POA: Diagnosis not present

## 2015-07-15 DIAGNOSIS — N2581 Secondary hyperparathyroidism of renal origin: Secondary | ICD-10-CM | POA: Diagnosis not present

## 2015-07-15 DIAGNOSIS — D509 Iron deficiency anemia, unspecified: Secondary | ICD-10-CM | POA: Diagnosis not present

## 2015-07-16 ENCOUNTER — Other Ambulatory Visit (INDEPENDENT_AMBULATORY_CARE_PROVIDER_SITE_OTHER): Payer: Medicare Other

## 2015-07-16 DIAGNOSIS — R3129 Other microscopic hematuria: Secondary | ICD-10-CM | POA: Diagnosis not present

## 2015-07-16 DIAGNOSIS — N186 End stage renal disease: Secondary | ICD-10-CM

## 2015-07-16 DIAGNOSIS — R109 Unspecified abdominal pain: Secondary | ICD-10-CM | POA: Diagnosis not present

## 2015-07-16 DIAGNOSIS — E039 Hypothyroidism, unspecified: Secondary | ICD-10-CM

## 2015-07-16 LAB — LIPID PANEL
CHOLESTEROL: 172 mg/dL (ref 0–200)
HDL: 46.6 mg/dL (ref >39.00)
LDL Cholesterol: 98 mg/dL (ref 0–99)
NonHDL: 125.04
Total CHOL/HDL Ratio: 4
Triglycerides: 135 mg/dL (ref 0.0–149.0)
VLDL: 27 mg/dL (ref 0.0–40.0)

## 2015-07-16 LAB — TSH: TSH: 20.1 u[IU]/mL — AB (ref 0.35–4.50)

## 2015-07-17 DIAGNOSIS — N186 End stage renal disease: Secondary | ICD-10-CM | POA: Diagnosis not present

## 2015-07-17 DIAGNOSIS — D509 Iron deficiency anemia, unspecified: Secondary | ICD-10-CM | POA: Diagnosis not present

## 2015-07-17 DIAGNOSIS — N2581 Secondary hyperparathyroidism of renal origin: Secondary | ICD-10-CM | POA: Diagnosis not present

## 2015-07-17 DIAGNOSIS — D689 Coagulation defect, unspecified: Secondary | ICD-10-CM | POA: Diagnosis not present

## 2015-07-18 LAB — URINE CULTURE
COLONY COUNT: NO GROWTH
Organism ID, Bacteria: NO GROWTH

## 2015-07-20 DIAGNOSIS — N186 End stage renal disease: Secondary | ICD-10-CM | POA: Diagnosis not present

## 2015-07-20 DIAGNOSIS — D689 Coagulation defect, unspecified: Secondary | ICD-10-CM | POA: Diagnosis not present

## 2015-07-20 DIAGNOSIS — D509 Iron deficiency anemia, unspecified: Secondary | ICD-10-CM | POA: Diagnosis not present

## 2015-07-20 DIAGNOSIS — N2581 Secondary hyperparathyroidism of renal origin: Secondary | ICD-10-CM | POA: Diagnosis not present

## 2015-07-22 DIAGNOSIS — D689 Coagulation defect, unspecified: Secondary | ICD-10-CM | POA: Diagnosis not present

## 2015-07-22 DIAGNOSIS — N2581 Secondary hyperparathyroidism of renal origin: Secondary | ICD-10-CM | POA: Diagnosis not present

## 2015-07-22 DIAGNOSIS — D509 Iron deficiency anemia, unspecified: Secondary | ICD-10-CM | POA: Diagnosis not present

## 2015-07-22 DIAGNOSIS — N186 End stage renal disease: Secondary | ICD-10-CM | POA: Diagnosis not present

## 2015-07-24 DIAGNOSIS — I120 Hypertensive chronic kidney disease with stage 5 chronic kidney disease or end stage renal disease: Secondary | ICD-10-CM | POA: Diagnosis not present

## 2015-07-24 DIAGNOSIS — Z992 Dependence on renal dialysis: Secondary | ICD-10-CM | POA: Diagnosis not present

## 2015-07-24 DIAGNOSIS — N186 End stage renal disease: Secondary | ICD-10-CM | POA: Diagnosis not present

## 2015-07-27 DIAGNOSIS — D689 Coagulation defect, unspecified: Secondary | ICD-10-CM | POA: Diagnosis not present

## 2015-07-27 DIAGNOSIS — N186 End stage renal disease: Secondary | ICD-10-CM | POA: Diagnosis not present

## 2015-07-27 DIAGNOSIS — N2581 Secondary hyperparathyroidism of renal origin: Secondary | ICD-10-CM | POA: Diagnosis not present

## 2015-07-27 DIAGNOSIS — D509 Iron deficiency anemia, unspecified: Secondary | ICD-10-CM | POA: Diagnosis not present

## 2015-07-29 DIAGNOSIS — N2581 Secondary hyperparathyroidism of renal origin: Secondary | ICD-10-CM | POA: Diagnosis not present

## 2015-07-29 DIAGNOSIS — D509 Iron deficiency anemia, unspecified: Secondary | ICD-10-CM | POA: Diagnosis not present

## 2015-07-29 DIAGNOSIS — N186 End stage renal disease: Secondary | ICD-10-CM | POA: Diagnosis not present

## 2015-07-29 DIAGNOSIS — Z7682 Awaiting organ transplant status: Secondary | ICD-10-CM | POA: Diagnosis not present

## 2015-07-29 DIAGNOSIS — D689 Coagulation defect, unspecified: Secondary | ICD-10-CM | POA: Diagnosis not present

## 2015-07-30 ENCOUNTER — Encounter: Payer: Self-pay | Admitting: Emergency Medicine

## 2015-07-30 ENCOUNTER — Emergency Department
Admission: EM | Admit: 2015-07-30 | Discharge: 2015-07-30 | Disposition: A | Discharge status: Home / Self Care | Payer: Medicare Other | Source: Home / Self Care | Attending: Family Medicine | Admitting: Family Medicine

## 2015-07-30 DIAGNOSIS — H8110 Benign paroxysmal vertigo, unspecified ear: Secondary | ICD-10-CM | POA: Diagnosis not present

## 2015-07-30 DIAGNOSIS — J0101 Acute recurrent maxillary sinusitis: Secondary | ICD-10-CM | POA: Diagnosis not present

## 2015-07-30 MED ORDER — MECLIZINE HCL 25 MG PO TABS: 25.0000 mg | ORAL_TABLET | Freq: Three times a day (TID) | ORAL | Status: DC | PRN

## 2015-07-30 MED ORDER — AMOXICILLIN-POT CLAVULANATE 875-125 MG PO TABS: 1.0000 | ORAL_TABLET | Freq: Two times a day (BID) | ORAL | Status: DC

## 2015-07-30 NOTE — ED Provider Notes (Signed)
CSN: [id number]     Arrival date & time 07/30/15  1536 History   First MD Initiated Contact with Patient 07/30/15 1539     Chief Complaint  Patient presents with  . Dizziness   (Consider location/radiation/quality/duration/timing/severity/associated sxs/prior Treatment) HPI  Pt is a 69yo female presenting to [facility] with c/o dizziness described as room spinning, worse with head movement, with associated nasal congestion, frontal headache and mild intermittent productive cough for 1 week. Pt reports hx of vertigo. Symptoms feel similar but dizziness is not as bad.  Associated mild nausea but no vomiting. Pt is a dialysis pt, Monday/Wednesday/Friday, she has gone regularly and is due to go back tomorrow.  Denies chest pain or SOB.    Past Medical History  Diagnosis Date  . Hypertension   . Hypothyroidism   . Gout   . Arthritis   . Anemia     r/t kidney function- receives Procrit  . Cancer ([place])     kidney  . FSGS (focal segmental glomerulosclerosis)     right kidney;  . Osteopenia   . Diverticulosis     a. 05/2012 colonoscopy  . CKD (chronic kidney disease)     followed by Dr. [name].  . SVT (supraventricular tachycardia) ([place])   . GERD (gastroesophageal reflux disease)   . Hiatal hernia    Past Surgical History  Procedure Laterality Date  . Total abdominal hysterectomy w/ bilateral salpingoophorectomy      fibroids  . Nephrectomy  1993    for malignancy- left  . Colonoscopy with polypectomy      Dr Henrene Pastor  . Av fistula placement Right     Hemodyalisis initiated 2014; schedule is M/W/F [place]  . Renal biopsy      right  . Colonoscopy  2003; 05/2012    diverticulosis, no polyps.  Recall 10 yrs  . Tee without cardioversion N/A 08/27/2013    Procedure: TRANSESOPHAGEAL ECHOCARDIOGRAM (TEE);  Surgeon: [name], MD;  Location: [facility] ENDOSCOPY;  Service: Cardiovascular;  Laterality: N/A;  . Vulva / perineum biopsy  2015   Family History  Problem Relation Age of  Onset  . Deep vein thrombosis Mother     post thyroid surgery  . Hypertension Mother   . Heart attack Father 1    deceased  . Hypertension Father   . Cancer Sister     breast  . Cancer Paternal Aunt     pancreatic  . Diabetes Maternal Grandfather   . Stroke Paternal Grandmother     in 42s  . Colon cancer Neg Hx   . Esophageal cancer Neg Hx   . Stomach cancer Neg Hx   . Rectal cancer Neg Hx    Social History  Substance Use Topics  . Smoking status: Never Smoker   . Smokeless tobacco: Never Used  . Alcohol Use: Yes     Comment: rarely   OB History    Gravida Para Term Preterm AB TAB SAB Ectopic Multiple Living   2 2 2  0 0 0 0 0 0 2     Review of Systems  Constitutional: Negative for fever and chills.  HENT: Positive for congestion, ear pain (fullness), rhinorrhea, sinus pressure and sore throat. Negative for trouble swallowing and voice change.   Respiratory: Positive for cough. Negative for shortness of breath.   Cardiovascular: Negative for chest pain and palpitations.  Gastrointestinal: Positive for nausea. Negative for vomiting, abdominal pain and diarrhea.  Musculoskeletal: Negative for myalgias, back pain and  arthralgias.  Skin: Negative for rash.  Neurological: Positive for dizziness and headaches. Negative for light-headedness.  All other systems reviewed and are negative.   Allergies  Cefaclor; Naproxen; Enalapril maleate; and Metoprolol tartrate  Home Medications   Prior to Admission medications   Medication Sig Start Date End Date Taking? Authorizing Provider  acetaminophen (TYLENOL) 500 MG tablet Take 500-750 mg by mouth every 6 (six) hours as needed for pain.    Historical Provider, MD  amoxicillin-clavulanate (AUGMENTIN) 875-125 MG tablet Take 1 tablet by mouth 2 (two) times daily. One po bid x 7 days 07/30/15   Noland Fordyce, PA-C  aspirin EC 81 MG EC tablet Take 1 tablet (81 mg total) by mouth daily. 12/09/12   Brooke O Edmisten, PA-C  AYR SALINE  NASAL DROPS NA Place 2 sprays into both nostrils daily.    Historical Provider, MD  Camphor-Eucalyptus-Menthol (VICKS VAPORUB EX) Apply 1 application topically daily.    Historical Provider, MD  clobetasol ointment (TEMOVATE) [id number] % Apply 1 application topically 2 (two) times daily. Apply to affected area for two weeks then once a day for 2 weeks. 06/22/14   Guss Bunde, MD  fluticasone (FLONASE) 50 MCG/ACT nasal spray Place 1 spray into the nose 2 (two) times daily as needed for rhinitis. 05/16/13   Hendricks Limes, MD  levothyroxine (SYNTHROID, LEVOTHROID) 175 MCG tablet take 1 tablet by mouth once daily EXCEPT ON [facility] TAKE 1AND1/2 tablets 11/11/14   Hendricks Limes, MD  loratadine (CLARITIN) 10 MG tablet Take 20 mg by mouth as needed for allergies.    Historical Provider, MD  meclizine (ANTIVERT) 25 MG tablet Take 1 tablet (25 mg total) by mouth 3 (three) times daily as needed for dizziness or nausea. 07/30/15   Noland Fordyce, PA-C  multivitamin (RENA-VIT) TABS tablet take 1 tablet by mouth daily 07/28/14   Hendricks Limes, MD  propranolol (INDERAL) 40 MG tablet take 1 tablet by mouth once daily ON TUES, THURS, SAT, AND SUN. AND TWICE DAILY MON., WED, AND FRI. 01/29/15   Hendricks Limes, MD  sevelamer carbonate (RENVELA) 800 MG tablet Take 800 mg by mouth 3 (three) times daily with meals.    Historical Provider, MD  verapamil (CALAN-SR) 120 MG CR tablet Take 1 tablet (120 mg total) by mouth daily. 12/15/14   Hendricks Limes, MD   Meds Ordered and Administered this Visit  Medications - No data to display  BP 150/86 mmHg  Pulse 64  Temp(Src) 98.2 F (36.8 C) (Oral)  Ht 5\' 5"  (1.651 m)  Wt 219 lb (99.338 kg)  BMI 36.44 kg/m2  SpO2 95% No data found.   Physical Exam  Constitutional: She is oriented to person, place, and time. She appears well-developed and well-nourished. No distress.  HENT:  Head: Normocephalic and atraumatic.  Right Ear: Hearing, tympanic membrane, external ear  and ear canal normal.  Left Ear: Hearing, tympanic membrane, external ear and ear canal normal.  Nose: Mucosal edema present.  Mouth/Throat: Uvula is midline, oropharynx is clear and moist and mucous membranes are normal.  Eyes: Conjunctivae and lids are normal. Pupils are equal, round, and reactive to light. No scleral icterus. Right eye exhibits nystagmus. Left eye exhibits nystagmus.  Mild horizontal nystagmus in both eyes  Neck: Normal range of motion. Neck supple.  Cardiovascular: Normal rate and regular rhythm.   Murmur heard. Pulmonary/Chest: Effort normal and breath sounds normal. No stridor. No respiratory distress. She has no wheezes. She has no  rales. She exhibits no tenderness.  Abdominal: Soft. Bowel sounds are normal. She exhibits no distension and no mass. There is no tenderness. There is no rebound and no guarding.  Musculoskeletal: Normal range of motion.  Lymphadenopathy:    She has no cervical adenopathy.  Neurological: She is alert and oriented to person, place, and time. She has normal strength. No cranial nerve deficit or sensory deficit. Coordination and gait normal. GCS eye subscore is 4. GCS verbal subscore is 5. GCS motor subscore is 6.  Skin: Skin is warm and dry. She is not diaphoretic.  Nursing note and vitals reviewed.   ED Course  Procedures (including critical care time)  Labs Review Labs Reviewed - No data to display  Imaging Review No results found.  Tympanometry: normal  MDM   1. Acute recurrent maxillary sinusitis   2. Benign paroxysmal positional vertigo, unspecified laterality    Pt c/o dizziness with mild nausea that started after URI symptoms for 1 week.  Mild horizontal nystagmus, otherwise normal neuro exam.   MWF dialysis, been consistent, plans to go tomorrow as scheduled. Denies chest pain or SOB.  Murmur noted on exam, pt states it is due to her fistula she has for dialysis, she has had since 2014.   Will tx for sinusitis, likely  cause of current vertigo Rx: Augmentin and meclizine Advised pt she may try using the Augmentin first then meclizine if symptoms not improving.  Encouraged to let dialysis know tomorrow what medications she was started on today. F/u with PCP next week for recheck of symptoms if not improving. Discussed symptoms that warrant emergent care in the ED. Patient verbalized understanding and agreement with treatment plan.   Noland Fordyce, PA-C 07/30/15 1720

## 2015-07-30 NOTE — ED Notes (Signed)
Dizziness, congestion x 1 week

## 2015-07-31 DIAGNOSIS — N186 End stage renal disease: Secondary | ICD-10-CM | POA: Diagnosis not present

## 2015-07-31 DIAGNOSIS — D689 Coagulation defect, unspecified: Secondary | ICD-10-CM | POA: Diagnosis not present

## 2015-07-31 DIAGNOSIS — N2581 Secondary hyperparathyroidism of renal origin: Secondary | ICD-10-CM | POA: Diagnosis not present

## 2015-07-31 DIAGNOSIS — D509 Iron deficiency anemia, unspecified: Secondary | ICD-10-CM | POA: Diagnosis not present

## 2015-08-03 DIAGNOSIS — N186 End stage renal disease: Secondary | ICD-10-CM | POA: Diagnosis not present

## 2015-08-03 DIAGNOSIS — N2581 Secondary hyperparathyroidism of renal origin: Secondary | ICD-10-CM | POA: Diagnosis not present

## 2015-08-03 DIAGNOSIS — D689 Coagulation defect, unspecified: Secondary | ICD-10-CM | POA: Diagnosis not present

## 2015-08-03 DIAGNOSIS — D509 Iron deficiency anemia, unspecified: Secondary | ICD-10-CM | POA: Diagnosis not present

## 2015-08-04 ENCOUNTER — Telehealth: Payer: Self-pay | Admitting: Emergency Medicine

## 2015-08-05 DIAGNOSIS — D689 Coagulation defect, unspecified: Secondary | ICD-10-CM | POA: Diagnosis not present

## 2015-08-05 DIAGNOSIS — N186 End stage renal disease: Secondary | ICD-10-CM | POA: Diagnosis not present

## 2015-08-05 DIAGNOSIS — D509 Iron deficiency anemia, unspecified: Secondary | ICD-10-CM | POA: Diagnosis not present

## 2015-08-05 DIAGNOSIS — N2581 Secondary hyperparathyroidism of renal origin: Secondary | ICD-10-CM | POA: Diagnosis not present

## 2015-08-07 DIAGNOSIS — D689 Coagulation defect, unspecified: Secondary | ICD-10-CM | POA: Diagnosis not present

## 2015-08-07 DIAGNOSIS — N186 End stage renal disease: Secondary | ICD-10-CM | POA: Diagnosis not present

## 2015-08-07 DIAGNOSIS — N2581 Secondary hyperparathyroidism of renal origin: Secondary | ICD-10-CM | POA: Diagnosis not present

## 2015-08-07 DIAGNOSIS — D509 Iron deficiency anemia, unspecified: Secondary | ICD-10-CM | POA: Diagnosis not present

## 2015-08-08 DIAGNOSIS — N033 Chronic nephritic syndrome with diffuse mesangial proliferative glomerulonephritis: Secondary | ICD-10-CM | POA: Diagnosis not present

## 2015-08-08 DIAGNOSIS — Z992 Dependence on renal dialysis: Secondary | ICD-10-CM | POA: Diagnosis not present

## 2015-08-08 DIAGNOSIS — N186 End stage renal disease: Secondary | ICD-10-CM | POA: Diagnosis not present

## 2015-08-10 DIAGNOSIS — D689 Coagulation defect, unspecified: Secondary | ICD-10-CM | POA: Diagnosis not present

## 2015-08-10 DIAGNOSIS — N186 End stage renal disease: Secondary | ICD-10-CM | POA: Diagnosis not present

## 2015-08-10 DIAGNOSIS — D631 Anemia in chronic kidney disease: Secondary | ICD-10-CM | POA: Diagnosis not present

## 2015-08-10 DIAGNOSIS — N2581 Secondary hyperparathyroidism of renal origin: Secondary | ICD-10-CM | POA: Diagnosis not present

## 2015-08-10 DIAGNOSIS — D509 Iron deficiency anemia, unspecified: Secondary | ICD-10-CM | POA: Diagnosis not present

## 2015-08-12 DIAGNOSIS — D689 Coagulation defect, unspecified: Secondary | ICD-10-CM | POA: Diagnosis not present

## 2015-08-12 DIAGNOSIS — N2581 Secondary hyperparathyroidism of renal origin: Secondary | ICD-10-CM | POA: Diagnosis not present

## 2015-08-12 DIAGNOSIS — D631 Anemia in chronic kidney disease: Secondary | ICD-10-CM | POA: Diagnosis not present

## 2015-08-12 DIAGNOSIS — D509 Iron deficiency anemia, unspecified: Secondary | ICD-10-CM | POA: Diagnosis not present

## 2015-08-12 DIAGNOSIS — N186 End stage renal disease: Secondary | ICD-10-CM | POA: Diagnosis not present

## 2015-08-13 DIAGNOSIS — L438 Other lichen planus: Secondary | ICD-10-CM | POA: Diagnosis not present

## 2015-08-14 DIAGNOSIS — D631 Anemia in chronic kidney disease: Secondary | ICD-10-CM | POA: Diagnosis not present

## 2015-08-14 DIAGNOSIS — N186 End stage renal disease: Secondary | ICD-10-CM | POA: Diagnosis not present

## 2015-08-14 DIAGNOSIS — D509 Iron deficiency anemia, unspecified: Secondary | ICD-10-CM | POA: Diagnosis not present

## 2015-08-14 DIAGNOSIS — N2581 Secondary hyperparathyroidism of renal origin: Secondary | ICD-10-CM | POA: Diagnosis not present

## 2015-08-14 DIAGNOSIS — D689 Coagulation defect, unspecified: Secondary | ICD-10-CM | POA: Diagnosis not present

## 2015-08-17 DIAGNOSIS — D631 Anemia in chronic kidney disease: Secondary | ICD-10-CM | POA: Diagnosis not present

## 2015-08-17 DIAGNOSIS — D509 Iron deficiency anemia, unspecified: Secondary | ICD-10-CM | POA: Diagnosis not present

## 2015-08-17 DIAGNOSIS — N186 End stage renal disease: Secondary | ICD-10-CM | POA: Diagnosis not present

## 2015-08-17 DIAGNOSIS — D689 Coagulation defect, unspecified: Secondary | ICD-10-CM | POA: Diagnosis not present

## 2015-08-17 DIAGNOSIS — N2581 Secondary hyperparathyroidism of renal origin: Secondary | ICD-10-CM | POA: Diagnosis not present

## 2015-08-19 DIAGNOSIS — N186 End stage renal disease: Secondary | ICD-10-CM | POA: Diagnosis not present

## 2015-08-19 DIAGNOSIS — D509 Iron deficiency anemia, unspecified: Secondary | ICD-10-CM | POA: Diagnosis not present

## 2015-08-19 DIAGNOSIS — N2581 Secondary hyperparathyroidism of renal origin: Secondary | ICD-10-CM | POA: Diagnosis not present

## 2015-08-19 DIAGNOSIS — D689 Coagulation defect, unspecified: Secondary | ICD-10-CM | POA: Diagnosis not present

## 2015-08-19 DIAGNOSIS — D631 Anemia in chronic kidney disease: Secondary | ICD-10-CM | POA: Diagnosis not present

## 2015-08-21 DIAGNOSIS — N186 End stage renal disease: Secondary | ICD-10-CM | POA: Diagnosis not present

## 2015-08-21 DIAGNOSIS — N2581 Secondary hyperparathyroidism of renal origin: Secondary | ICD-10-CM | POA: Diagnosis not present

## 2015-08-21 DIAGNOSIS — D631 Anemia in chronic kidney disease: Secondary | ICD-10-CM | POA: Diagnosis not present

## 2015-08-21 DIAGNOSIS — D689 Coagulation defect, unspecified: Secondary | ICD-10-CM | POA: Diagnosis not present

## 2015-08-21 DIAGNOSIS — D509 Iron deficiency anemia, unspecified: Secondary | ICD-10-CM | POA: Diagnosis not present

## 2015-08-24 DIAGNOSIS — D509 Iron deficiency anemia, unspecified: Secondary | ICD-10-CM | POA: Diagnosis not present

## 2015-08-24 DIAGNOSIS — D689 Coagulation defect, unspecified: Secondary | ICD-10-CM | POA: Diagnosis not present

## 2015-08-24 DIAGNOSIS — D631 Anemia in chronic kidney disease: Secondary | ICD-10-CM | POA: Diagnosis not present

## 2015-08-24 DIAGNOSIS — N2581 Secondary hyperparathyroidism of renal origin: Secondary | ICD-10-CM | POA: Diagnosis not present

## 2015-08-24 DIAGNOSIS — N186 End stage renal disease: Secondary | ICD-10-CM | POA: Diagnosis not present

## 2015-08-26 DIAGNOSIS — D689 Coagulation defect, unspecified: Secondary | ICD-10-CM | POA: Diagnosis not present

## 2015-08-26 DIAGNOSIS — D631 Anemia in chronic kidney disease: Secondary | ICD-10-CM | POA: Diagnosis not present

## 2015-08-26 DIAGNOSIS — D509 Iron deficiency anemia, unspecified: Secondary | ICD-10-CM | POA: Diagnosis not present

## 2015-08-26 DIAGNOSIS — N186 End stage renal disease: Secondary | ICD-10-CM | POA: Diagnosis not present

## 2015-08-26 DIAGNOSIS — N2581 Secondary hyperparathyroidism of renal origin: Secondary | ICD-10-CM | POA: Diagnosis not present

## 2015-08-28 DIAGNOSIS — N186 End stage renal disease: Secondary | ICD-10-CM | POA: Diagnosis not present

## 2015-08-28 DIAGNOSIS — N2581 Secondary hyperparathyroidism of renal origin: Secondary | ICD-10-CM | POA: Diagnosis not present

## 2015-08-28 DIAGNOSIS — D509 Iron deficiency anemia, unspecified: Secondary | ICD-10-CM | POA: Diagnosis not present

## 2015-08-28 DIAGNOSIS — D631 Anemia in chronic kidney disease: Secondary | ICD-10-CM | POA: Diagnosis not present

## 2015-08-28 DIAGNOSIS — D689 Coagulation defect, unspecified: Secondary | ICD-10-CM | POA: Diagnosis not present

## 2015-08-31 DIAGNOSIS — D509 Iron deficiency anemia, unspecified: Secondary | ICD-10-CM | POA: Diagnosis not present

## 2015-08-31 DIAGNOSIS — D689 Coagulation defect, unspecified: Secondary | ICD-10-CM | POA: Diagnosis not present

## 2015-08-31 DIAGNOSIS — N2581 Secondary hyperparathyroidism of renal origin: Secondary | ICD-10-CM | POA: Diagnosis not present

## 2015-08-31 DIAGNOSIS — N186 End stage renal disease: Secondary | ICD-10-CM | POA: Diagnosis not present

## 2015-08-31 DIAGNOSIS — D631 Anemia in chronic kidney disease: Secondary | ICD-10-CM | POA: Diagnosis not present

## 2015-09-02 DIAGNOSIS — D689 Coagulation defect, unspecified: Secondary | ICD-10-CM | POA: Diagnosis not present

## 2015-09-02 DIAGNOSIS — N186 End stage renal disease: Secondary | ICD-10-CM | POA: Diagnosis not present

## 2015-09-02 DIAGNOSIS — N2581 Secondary hyperparathyroidism of renal origin: Secondary | ICD-10-CM | POA: Diagnosis not present

## 2015-09-02 DIAGNOSIS — D631 Anemia in chronic kidney disease: Secondary | ICD-10-CM | POA: Diagnosis not present

## 2015-09-02 DIAGNOSIS — Z7682 Awaiting organ transplant status: Secondary | ICD-10-CM | POA: Diagnosis not present

## 2015-09-02 DIAGNOSIS — D509 Iron deficiency anemia, unspecified: Secondary | ICD-10-CM | POA: Diagnosis not present

## 2015-09-04 DIAGNOSIS — N186 End stage renal disease: Secondary | ICD-10-CM | POA: Diagnosis not present

## 2015-09-04 DIAGNOSIS — D509 Iron deficiency anemia, unspecified: Secondary | ICD-10-CM | POA: Diagnosis not present

## 2015-09-04 DIAGNOSIS — N2581 Secondary hyperparathyroidism of renal origin: Secondary | ICD-10-CM | POA: Diagnosis not present

## 2015-09-04 DIAGNOSIS — D631 Anemia in chronic kidney disease: Secondary | ICD-10-CM | POA: Diagnosis not present

## 2015-09-04 DIAGNOSIS — D689 Coagulation defect, unspecified: Secondary | ICD-10-CM | POA: Diagnosis not present

## 2015-09-07 DIAGNOSIS — N2581 Secondary hyperparathyroidism of renal origin: Secondary | ICD-10-CM | POA: Diagnosis not present

## 2015-09-07 DIAGNOSIS — D509 Iron deficiency anemia, unspecified: Secondary | ICD-10-CM | POA: Diagnosis not present

## 2015-09-07 DIAGNOSIS — D631 Anemia in chronic kidney disease: Secondary | ICD-10-CM | POA: Diagnosis not present

## 2015-09-07 DIAGNOSIS — D689 Coagulation defect, unspecified: Secondary | ICD-10-CM | POA: Diagnosis not present

## 2015-09-07 DIAGNOSIS — N186 End stage renal disease: Secondary | ICD-10-CM | POA: Diagnosis not present

## 2015-09-08 DIAGNOSIS — N186 End stage renal disease: Secondary | ICD-10-CM | POA: Diagnosis not present

## 2015-09-08 DIAGNOSIS — N033 Chronic nephritic syndrome with diffuse mesangial proliferative glomerulonephritis: Secondary | ICD-10-CM | POA: Diagnosis not present

## 2015-09-08 DIAGNOSIS — Z992 Dependence on renal dialysis: Secondary | ICD-10-CM | POA: Diagnosis not present

## 2015-09-09 DIAGNOSIS — N186 End stage renal disease: Secondary | ICD-10-CM | POA: Diagnosis not present

## 2015-09-09 DIAGNOSIS — N2581 Secondary hyperparathyroidism of renal origin: Secondary | ICD-10-CM | POA: Diagnosis not present

## 2015-09-09 DIAGNOSIS — D509 Iron deficiency anemia, unspecified: Secondary | ICD-10-CM | POA: Diagnosis not present

## 2015-09-09 DIAGNOSIS — D689 Coagulation defect, unspecified: Secondary | ICD-10-CM | POA: Diagnosis not present

## 2015-09-11 DIAGNOSIS — N186 End stage renal disease: Secondary | ICD-10-CM | POA: Diagnosis not present

## 2015-09-11 DIAGNOSIS — Z992 Dependence on renal dialysis: Secondary | ICD-10-CM | POA: Diagnosis not present

## 2015-09-14 DIAGNOSIS — Z992 Dependence on renal dialysis: Secondary | ICD-10-CM | POA: Diagnosis not present

## 2015-09-14 DIAGNOSIS — N186 End stage renal disease: Secondary | ICD-10-CM | POA: Diagnosis not present

## 2015-09-16 ENCOUNTER — Ambulatory Visit (INDEPENDENT_AMBULATORY_CARE_PROVIDER_SITE_OTHER): Payer: Medicare Other

## 2015-09-16 ENCOUNTER — Ambulatory Visit (INDEPENDENT_AMBULATORY_CARE_PROVIDER_SITE_OTHER): Payer: Medicare Other | Admitting: Family Medicine

## 2015-09-16 ENCOUNTER — Telehealth: Payer: Self-pay | Admitting: Family Medicine

## 2015-09-16 ENCOUNTER — Encounter: Payer: Self-pay | Admitting: Family Medicine

## 2015-09-16 VITALS — BP 139/89 | HR 79 | Temp 98.6°F | Resp 20 | Wt 211.5 lb

## 2015-09-16 DIAGNOSIS — R062 Wheezing: Secondary | ICD-10-CM | POA: Diagnosis not present

## 2015-09-16 DIAGNOSIS — R059 Cough, unspecified: Secondary | ICD-10-CM

## 2015-09-16 DIAGNOSIS — J209 Acute bronchitis, unspecified: Secondary | ICD-10-CM | POA: Insufficient documentation

## 2015-09-16 DIAGNOSIS — D509 Iron deficiency anemia, unspecified: Secondary | ICD-10-CM | POA: Diagnosis not present

## 2015-09-16 DIAGNOSIS — R05 Cough: Secondary | ICD-10-CM

## 2015-09-16 DIAGNOSIS — N2581 Secondary hyperparathyroidism of renal origin: Secondary | ICD-10-CM | POA: Diagnosis not present

## 2015-09-16 DIAGNOSIS — D689 Coagulation defect, unspecified: Secondary | ICD-10-CM | POA: Diagnosis not present

## 2015-09-16 DIAGNOSIS — N186 End stage renal disease: Secondary | ICD-10-CM | POA: Diagnosis not present

## 2015-09-16 LAB — CBC WITH DIFFERENTIAL/PLATELET
BASOS ABS: 0 10*3/uL (ref 0.0–0.1)
Basophils Relative: 0 % (ref 0–1)
Eosinophils Absolute: 0.2 10*3/uL (ref 0.0–0.7)
Eosinophils Relative: 3 % (ref 0–5)
HEMATOCRIT: 33.1 % — AB (ref 36.0–46.0)
HEMOGLOBIN: 11.1 g/dL — AB (ref 12.0–15.0)
LYMPHS ABS: 1.8 10*3/uL (ref 0.7–4.0)
LYMPHS PCT: 33 % (ref 12–46)
MCH: 31.8 pg (ref 26.0–34.0)
MCHC: 33.5 g/dL (ref 30.0–36.0)
MCV: 94.8 fL (ref 78.0–100.0)
MPV: 9.6 fL (ref 8.6–12.4)
Monocytes Absolute: 0.6 10*3/uL (ref 0.1–1.0)
Monocytes Relative: 10 % (ref 3–12)
NEUTROS PCT: 54 % (ref 43–77)
Neutro Abs: 3 10*3/uL (ref 1.7–7.7)
Platelets: 179 10*3/uL (ref 150–400)
RBC: 3.49 MIL/uL — ABNORMAL LOW (ref 3.87–5.11)
RDW: 13.7 % (ref 11.5–15.5)
WBC: 5.5 10*3/uL (ref 4.0–10.5)

## 2015-09-16 LAB — BASIC METABOLIC PANEL
BUN: 12 mg/dL (ref 7–25)
CHLORIDE: 96 mmol/L — AB (ref 98–110)
CO2: 33 mmol/L — AB (ref 20–31)
CREATININE: 3.61 mg/dL — AB (ref 0.60–0.93)
Calcium: 8.6 mg/dL (ref 8.6–10.4)
Glucose, Bld: 73 mg/dL (ref 65–99)
Potassium: 3.8 mmol/L (ref 3.5–5.3)
Sodium: 140 mmol/L (ref 135–146)

## 2015-09-16 IMAGING — CR DG CHEST 2V
2 series · 2 of 2 positions shown · non-contrast
Comparison: [DATE]

CLINICAL DATA: Cough x 1 wk, KEDIDI wheezing

EXAM:
CHEST  2 VIEW

[chest pa]
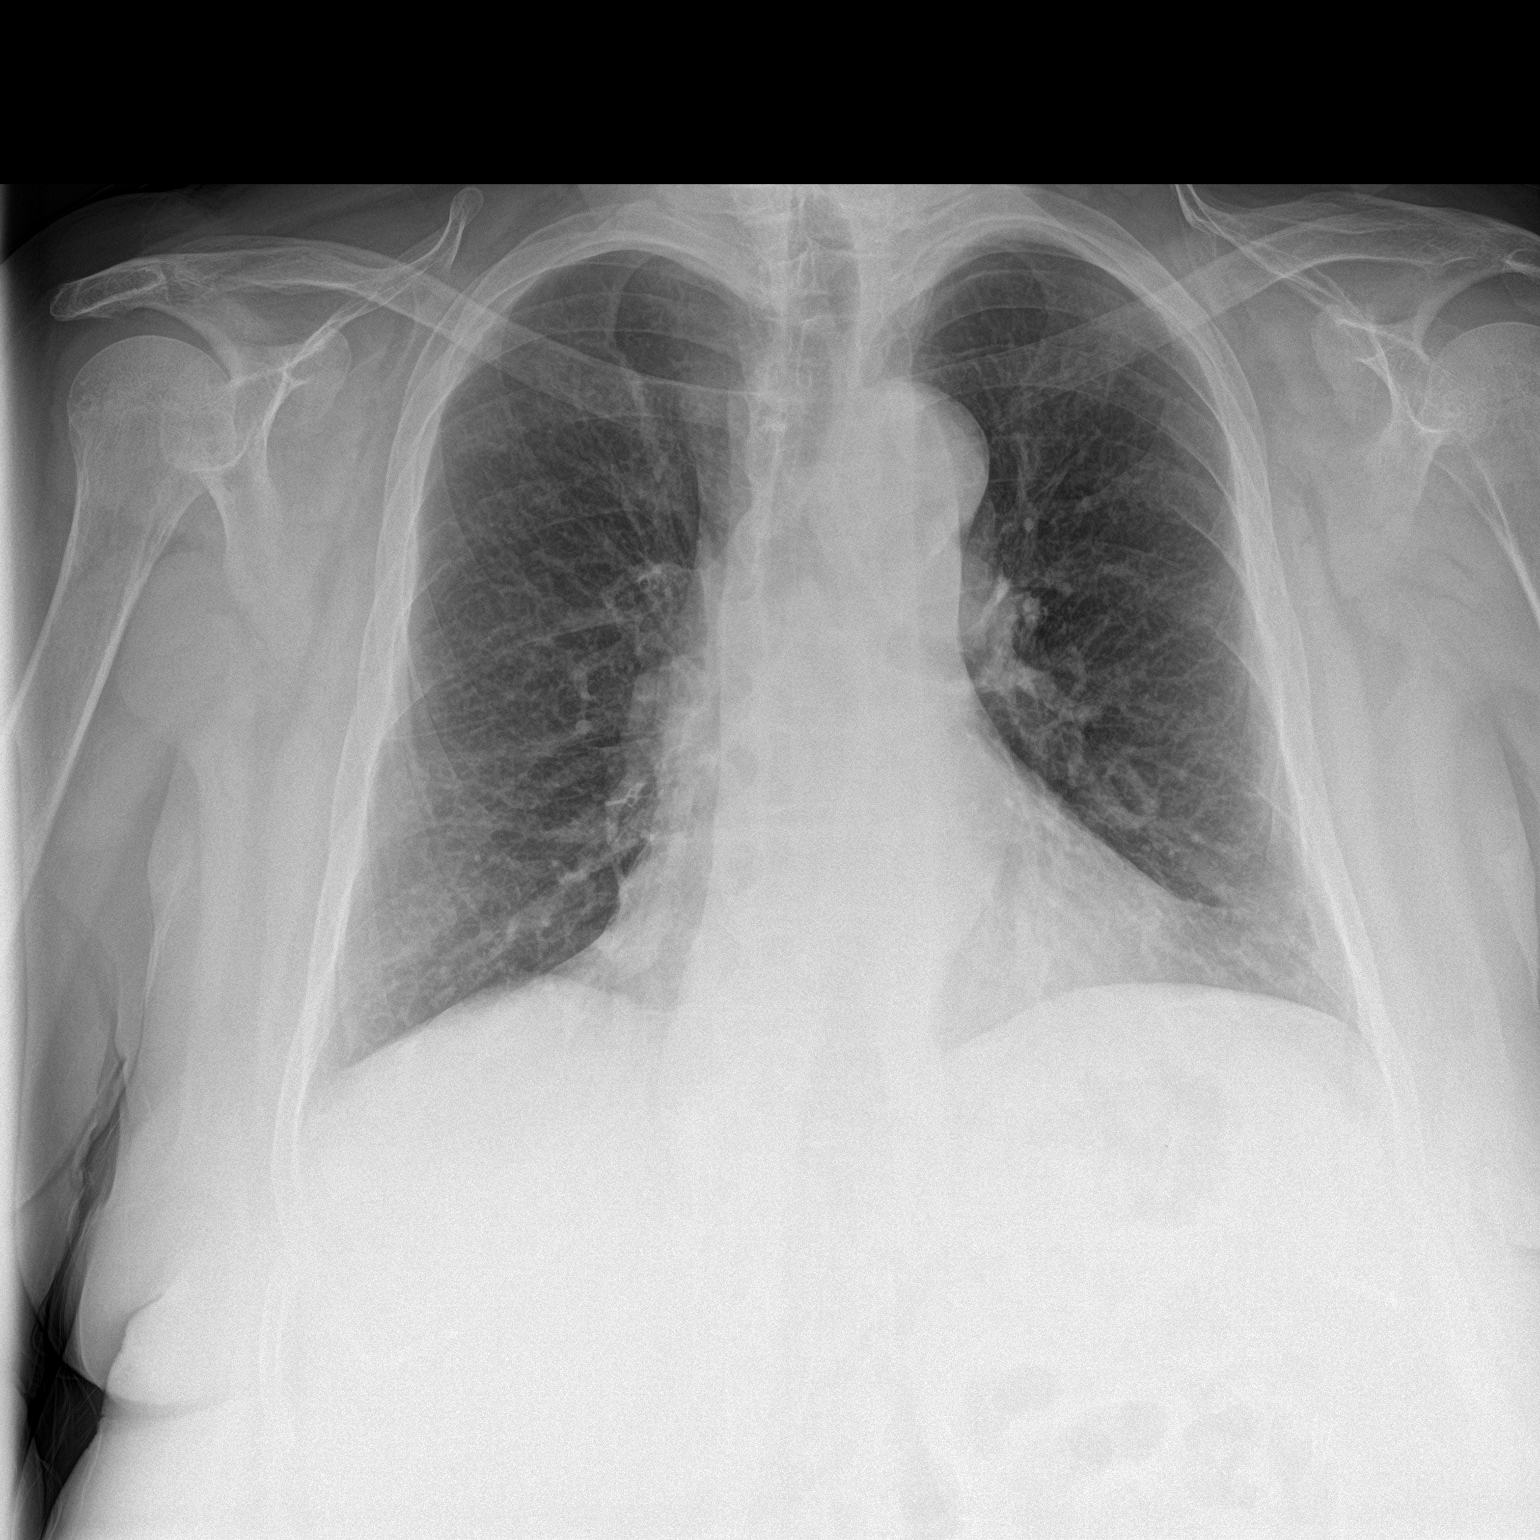

[chest lat]
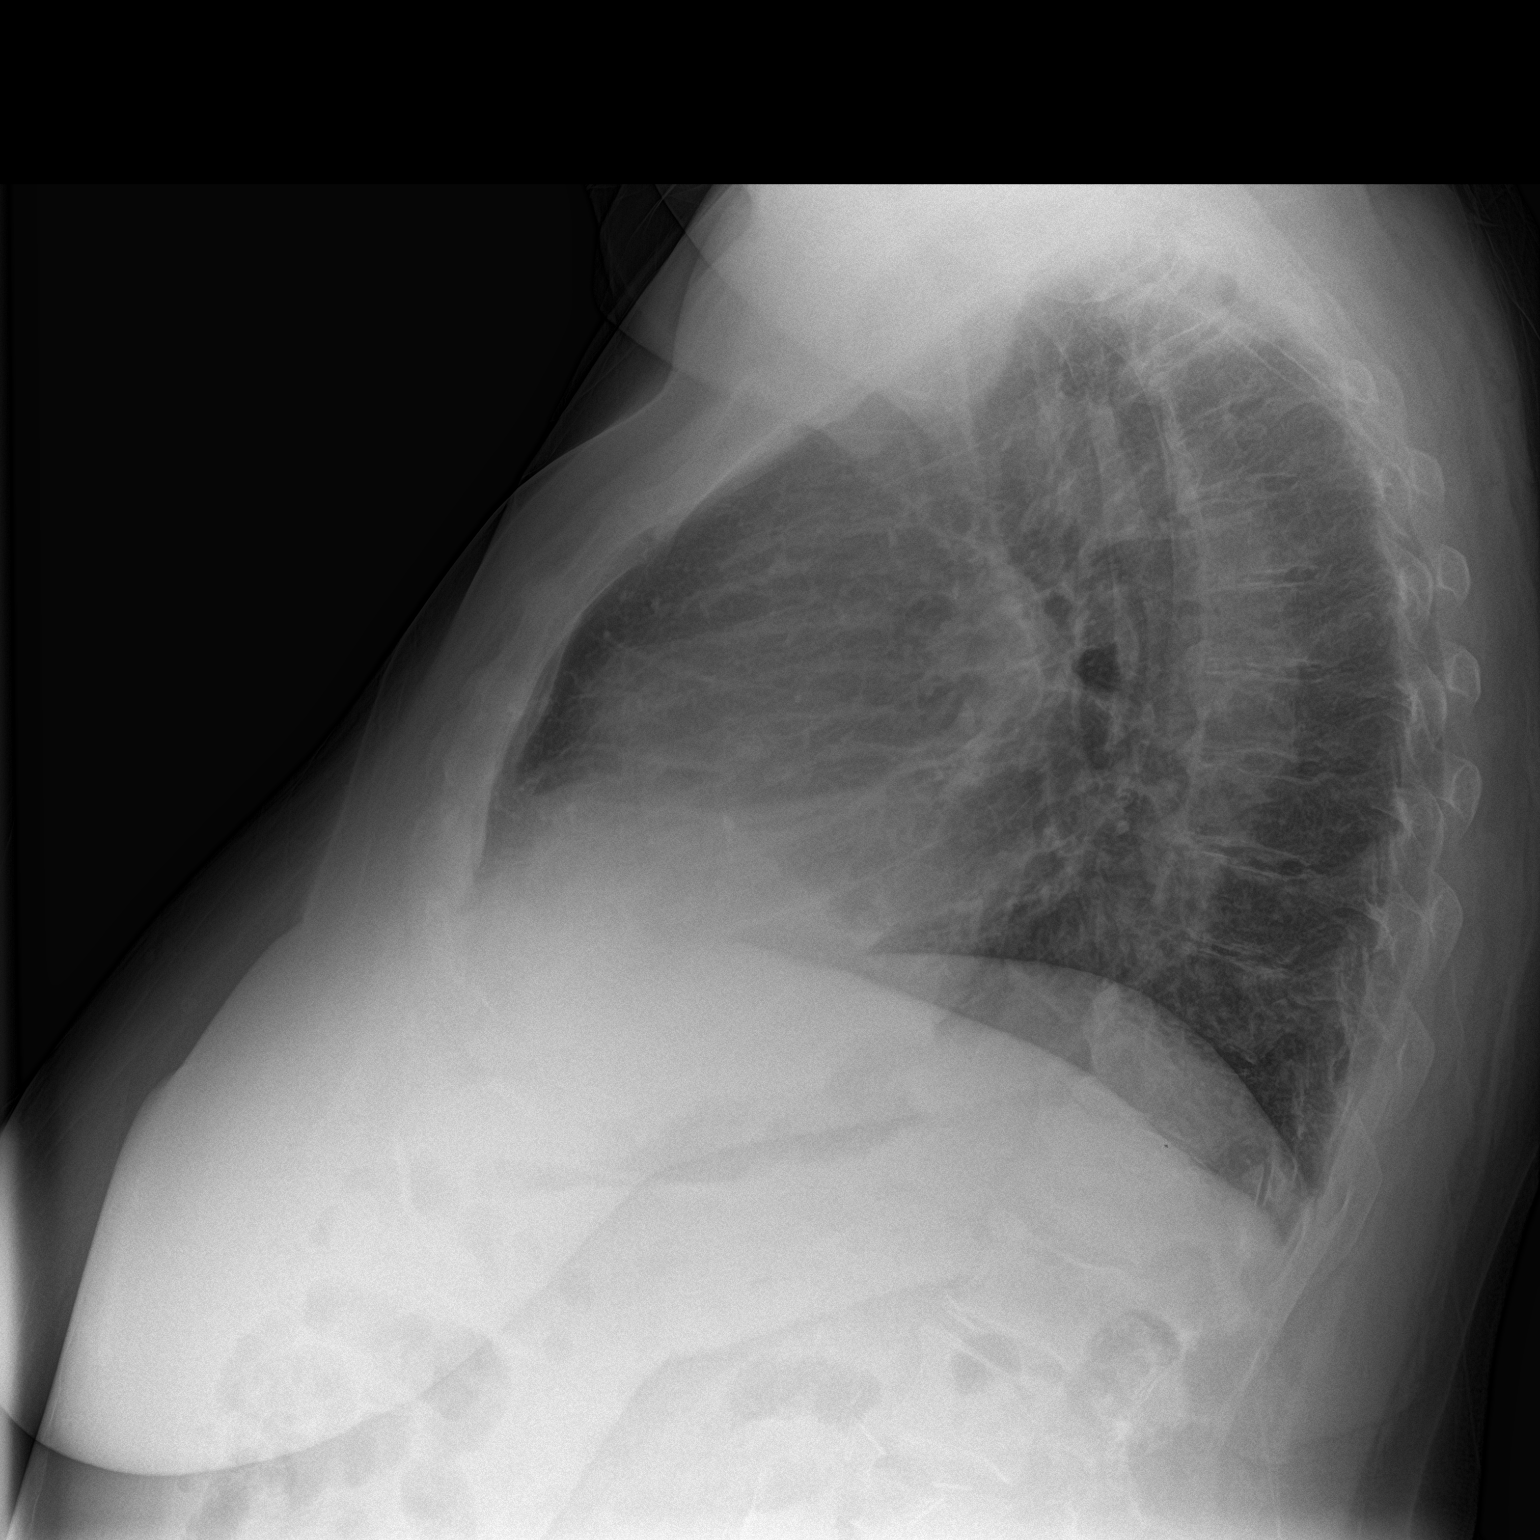

[2 of 2 positions shown; findings below may reference images not displayed]

FINDINGS: Normal cardiac silhouette. No effusion, infiltrate or pneumothorax.
Degenerative osteophytosis of the thoracic spine.
IMPRESSION: No acute cardiopulmonary process.

## 2015-09-16 MED ORDER — LEVOFLOXACIN 500 MG PO TABS: ORAL_TABLET | ORAL | Status: DC

## 2015-09-16 NOTE — Progress Notes (Signed)
Patient ID: Tabitha Green, female   DOB: Dec 02, 1945, 70 y.o.   MRN: [id number]    Tabitha Green , 02/12/1946, 70 y.o., female MRN: [id number]  CC: Cough Subjective: Pt presents for an acute OV with complaints of dry cough of > 1 week duration. Associated symptoms include fatigue, chills, nasal congestion, rhinorrhea. Pt feels symptoms are worse with laying down and better with nothing. Pt has tried Advil and mucinex, to ease their symptoms. She is tolerating PO. No shortness of breath or wheezing. Patient is a dialysis patient. Dialysis MWF with [facility], Dr. Campbell Riches. [name]. Pt makes urine and reports she is making her normal urine output. She denies fever, vomit, diarrhea, sore throat or myalgias. No lung disease.  Last GFR unknown, ESRD   Allergies  Allergen Reactions  . Cefaclor Rash  . Naproxen Rash  . Enalapril Maleate Cough  . Metoprolol Tartrate Rash    On legs   Social History  Substance Use Topics  . Smoking status: Never Smoker   . Smokeless tobacco: Never Used  . Alcohol Use: Yes     Comment: rarely   Past Medical History  Diagnosis Date  . Hypertension   . Hypothyroidism   . Gout   . Arthritis   . Anemia     r/t kidney function- receives Procrit  . Cancer ([place])     kidney  . FSGS (focal segmental glomerulosclerosis)     right kidney;  . Osteopenia   . Diverticulosis     a. 05/2012 colonoscopy  . CKD (chronic kidney disease)     followed by Dr. [name].  . SVT (supraventricular tachycardia) ([place])   . GERD (gastroesophageal reflux disease)   . Hiatal hernia    Past Surgical History  Procedure Laterality Date  . Total abdominal hysterectomy w/ bilateral salpingoophorectomy      fibroids  . Nephrectomy  1993    for malignancy- left  . Colonoscopy with polypectomy      Dr Henrene Pastor  . Av fistula placement Right     Hemodyalisis initiated 2014; schedule is M/W/F [place]  . Renal biopsy      right  . Colonoscopy  2003; 05/2012   diverticulosis, no polyps.  Recall 10 yrs  . Tee without cardioversion N/A 08/27/2013    Procedure: TRANSESOPHAGEAL ECHOCARDIOGRAM (TEE);  Surgeon: [name], MD;  Location: [facility] ENDOSCOPY;  Service: Cardiovascular;  Laterality: N/A;  . Vulva / perineum biopsy  2015   Family History  Problem Relation Age of Onset  . Deep vein thrombosis Mother     post thyroid surgery  . Hypertension Mother   . Heart attack Father 63    deceased  . Hypertension Father   . Cancer Sister     breast  . Cancer Paternal Aunt     pancreatic  . Diabetes Maternal Grandfather   . Stroke Paternal Grandmother     in 54s  . Colon cancer Neg Hx   . Esophageal cancer Neg Hx   . Stomach cancer Neg Hx   . Rectal cancer Neg Hx      Medication List       This list is accurate as of: 09/16/15  2:53 PM.  Always use your most recent med list.               acetaminophen 500 MG tablet  Commonly known as:  TYLENOL  Take 500-750 mg by mouth every 6 (six) hours as needed for pain.  aspirin 81 MG EC tablet  Take 1 tablet (81 mg total) by mouth daily.     AYR SALINE NASAL DROPS NA  Place 2 sprays into both nostrils daily.     CLARITIN 10 MG tablet  Generic drug:  loratadine  Take 20 mg by mouth as needed for allergies.     clobetasol ointment 0.05 %  Commonly known as:  TEMOVATE  Apply 1 application topically 2 (two) times daily. Apply to affected area for two weeks then once a day for 2 weeks.     fluticasone 50 MCG/ACT nasal spray  Commonly known as:  FLONASE  Place 1 spray into the nose 2 (two) times daily as needed for rhinitis.     levothyroxine 175 MCG tablet  Commonly known as:  SYNTHROID, LEVOTHROID  take 1 tablet by mouth once daily EXCEPT ON WEDNESDAY TAKE 1AND1/2 tablets     lidocaine-prilocaine cream  Commonly known as:  EMLA  APPLY SMALL AMOUNT TO ACCESS SITE 1 TO 2 HOURS BEFORE DIALYSIS. COVER WITH OCCLUSIVE DRESSING (SARAN WRAP).     meclizine 25 MG tablet  Commonly known as:   ANTIVERT  Take 1 tablet (25 mg total) by mouth 3 (three) times daily as needed for dizziness or nausea.     multivitamin Tabs tablet  take 1 tablet by mouth daily     propranolol 40 MG tablet  Commonly known as:  INDERAL  take 1 tablet by mouth once daily ON TUES, THURS, SAT, AND SUN. AND TWICE DAILY MON., WED, AND FRI.     SENSIPAR 30 MG tablet  Generic drug:  cinacalcet     sevelamer carbonate 800 MG tablet  Commonly known as:  RENVELA  Take 800 mg by mouth 3 (three) times daily with meals.     verapamil 120 MG CR tablet  Commonly known as:  CALAN-SR  Take 1 tablet (120 mg total) by mouth daily.     VICKS VAPORUB EX  Apply 1 application topically daily.       ROS: Negative, with the exception of above mentioned in HPI  Objective:  BP 139/89 mmHg  Pulse 79  Temp(Src) 98.6 F (37 C)  Resp 20  Wt 211 lb 8 oz (95.936 kg)  SpO2 94% Body mass index is 35.2 kg/(m^2). Gen: Afebrile. No acute distress. Nontoxic in appearance, well-developed, well-nourished, Caucasian female. HENT: AT. [place]. Bilateral TM visualized and normal in appearance. MMM, no oral lesions. Bilateral nares with erythema, mild swelling. Throat without erythema or exudates. Moderate cough on exam. Mild hoarseness on exam. Tender to palpation maxillary sinuses. Eyes:Pupils Equal Round Reactive to light, Extraocular movements intact,  Conjunctiva without redness, discharge or icterus. Neck/lymp/endocrine: Supple, shotty anterior cervical lymphadenopathy CV: RRR  Chest: Mild left upper lobe expiratory wheeze, otherwise clear to auscultation bilaterally.  Good air movement, normal resp effort.  Abd: Soft. NTND. BS present Skin: No rashes, purpura or petechiae.  Neuro: Normal gait. PERLA. EOMi. Alert. Oriented x3   Assessment/Plan: CADIA COBO is a 70 y.o. female present for acute OV  Wheeze/Cough -Rule out pneumonia considering dialysis patient with above history. - DG Chest 2 View; Future  Acute  bronchitis, unspecified organism - CBC w/Diff - Basic Metabolic Panel (BMET) - levofloxacin (LEVAQUIN) 500 MG tablet; 750 mg (1.5 tabs) day 1, then 48 hours 500 mg, repeat every 48 hours  Dispense: 6 tablet; Refill: 0 - Treat with renally dosed Levaquin for sinus infection/bronchitis and rule out pneumonia.   Howard Pouch, DO  [facility]- [place]

## 2015-09-16 NOTE — Patient Instructions (Signed)
Levaquin 750 mg first day, then 500 mg every 2 days F/U 1 week   Community-Acquired Pneumonia, Adult Pneumonia is an infection of the lungs. There are different types of pneumonia. One type can develop while a person is in a hospital. A different type, called community-acquired pneumonia, develops in people who are not, or have not recently been, in the hospital or other health care facility.  CAUSES Pneumonia may be caused by bacteria, viruses, or funguses. Community-acquired pneumonia is often caused by Streptococcus pneumonia bacteria. These bacteria are often passed from one person to another by breathing in droplets from the cough or sneeze of an infected person. RISK FACTORS The condition is more likely to develop in:  People who havechronic diseases, such as chronic obstructive pulmonary disease (COPD), asthma, congestive heart failure, cystic fibrosis, diabetes, or kidney disease.  People who haveearly-stage or late-stage HIV.  People who havesickle cell disease.  People who havehad their spleen removed (splenectomy).  People who havepoor Human resources officer.  People who havemedical conditions that increase the risk of breathing in (aspirating) secretions their own mouth and nose.   People who havea weakened immune system (immunocompromised).  People who smoke.  People whotravel to areas where pneumonia-causing germs commonly exist.  People whoare around animal habitats or animals that have pneumonia-causing germs, including birds, bats, rabbits, cats, and farm animals. SYMPTOMS Symptoms of this condition include:  Adry cough.  A wet (productive) cough.  Fever.  Sweating.  Chest pain, especially when breathing deeply or coughing.  Rapid breathing or difficulty breathing.  Shortness of breath.  Shaking chills.  Fatigue.  Muscle aches. DIAGNOSIS Your health care provider will take a medical history and perform a physical exam. You may also have other  tests, including:  Imaging studies of your chest, including X-rays.  Tests to check your blood oxygen level and other blood gases.  Other tests on blood, mucus (sputum), fluid around your lungs (pleural fluid), and urine. If your pneumonia is severe, other tests may be done to identify the specific cause of your illness. TREATMENT The type of treatment that you receive depends on many factors, such as the cause of your pneumonia, the medicines you take, and other medical conditions that you have. For most adults, treatment and recovery from pneumonia may occur at home. In some cases, treatment must happen in a hospital. Treatment may include:  Antibiotic medicines, if the pneumonia was caused by bacteria.  Antiviral medicines, if the pneumonia was caused by a virus.  Medicines that are given by mouth or through an IV tube.  Oxygen.  Respiratory therapy. Although rare, treating severe pneumonia may include:  Mechanical ventilation. This is done if you are not breathing well on your own and you cannot maintain a safe blood oxygen level.  Thoracentesis. This procedureremoves fluid around one lung or both lungs to help you breathe better. HOME CARE INSTRUCTIONS  Take over-the-counter and prescription medicines only as told by your health care provider.  Only takecough medicine if you are losing sleep. Understand that cough medicine can prevent your body's natural ability to remove mucus from your lungs.  If you were prescribed an antibiotic medicine, take it as told by your health care provider. Do not stop taking the antibiotic even if you start to feel better.  Sleep in a semi-upright position at night. Try sleeping in a reclining chair, or place a few pillows under your head.  Do not use tobacco products, including cigarettes, chewing tobacco, and e-cigarettes. If you  need help quitting, ask your health care provider.  Drink enough water to keep your urine clear or pale yellow.  This will help to thin out mucus secretions in your lungs. PREVENTION There are ways that you can decrease your risk of developing community-acquired pneumonia. Consider getting a pneumococcal vaccine if:  You are older than 70 years of age.  You are older than 70 years of age and are undergoing cancer treatment, have chronic lung disease, or have other medical conditions that affect your immune system. Ask your health care provider if this applies to you. There are different types and schedules of pneumococcal vaccines. Ask your health care provider which vaccination option is best for you. You may also prevent community-acquired pneumonia if you take these actions:  Get an influenza vaccine every year. Ask your health care provider which type of influenza vaccine is best for you.  Go to the dentist on a regular basis.  Wash your hands often. Use hand sanitizer if soap and water are not available. SEEK MEDICAL CARE IF:  You have a fever.  You are losing sleep because you cannot control your cough with cough medicine. SEEK IMMEDIATE MEDICAL CARE IF:  You have worsening shortness of breath.  You have increased chest pain.  Your sickness becomes worse, especially if you are an older adult or have a weakened immune system.  You cough up blood.   This information is not intended to replace advice given to you by your health care provider. Make sure you discuss any questions you have with your health care provider.   Document Released: 07/25/2005 Document Revised: 04/15/2015 Document Reviewed: 11/19/2014 Elsevier Interactive Patient Education [employer].

## 2015-09-17 NOTE — Telephone Encounter (Signed)
Please call patient, her chest x-ray did not show signs of pneumonia. She is to continue the medication prescribed to her yesterday as directed since it is dosed for her end-stage renal disease. Her blood work appeared stable.

## 2015-09-17 NOTE — Telephone Encounter (Signed)
Spoke with patient reviewed lab and xray results.Patient verbalized understanding.

## 2015-09-18 DIAGNOSIS — D689 Coagulation defect, unspecified: Secondary | ICD-10-CM | POA: Diagnosis not present

## 2015-09-18 DIAGNOSIS — N2581 Secondary hyperparathyroidism of renal origin: Secondary | ICD-10-CM | POA: Diagnosis not present

## 2015-09-18 DIAGNOSIS — D509 Iron deficiency anemia, unspecified: Secondary | ICD-10-CM | POA: Diagnosis not present

## 2015-09-18 DIAGNOSIS — N186 End stage renal disease: Secondary | ICD-10-CM | POA: Diagnosis not present

## 2015-09-21 DIAGNOSIS — N186 End stage renal disease: Secondary | ICD-10-CM | POA: Diagnosis not present

## 2015-09-21 DIAGNOSIS — N2581 Secondary hyperparathyroidism of renal origin: Secondary | ICD-10-CM | POA: Diagnosis not present

## 2015-09-21 DIAGNOSIS — D689 Coagulation defect, unspecified: Secondary | ICD-10-CM | POA: Diagnosis not present

## 2015-09-21 DIAGNOSIS — D509 Iron deficiency anemia, unspecified: Secondary | ICD-10-CM | POA: Diagnosis not present

## 2015-09-23 DIAGNOSIS — N2581 Secondary hyperparathyroidism of renal origin: Secondary | ICD-10-CM | POA: Diagnosis not present

## 2015-09-23 DIAGNOSIS — N186 End stage renal disease: Secondary | ICD-10-CM | POA: Diagnosis not present

## 2015-09-23 DIAGNOSIS — D509 Iron deficiency anemia, unspecified: Secondary | ICD-10-CM | POA: Diagnosis not present

## 2015-09-23 DIAGNOSIS — D689 Coagulation defect, unspecified: Secondary | ICD-10-CM | POA: Diagnosis not present

## 2015-09-25 DIAGNOSIS — D689 Coagulation defect, unspecified: Secondary | ICD-10-CM | POA: Diagnosis not present

## 2015-09-25 DIAGNOSIS — N186 End stage renal disease: Secondary | ICD-10-CM | POA: Diagnosis not present

## 2015-09-25 DIAGNOSIS — N2581 Secondary hyperparathyroidism of renal origin: Secondary | ICD-10-CM | POA: Diagnosis not present

## 2015-09-25 DIAGNOSIS — D509 Iron deficiency anemia, unspecified: Secondary | ICD-10-CM | POA: Diagnosis not present

## 2015-09-28 DIAGNOSIS — N2581 Secondary hyperparathyroidism of renal origin: Secondary | ICD-10-CM | POA: Diagnosis not present

## 2015-09-28 DIAGNOSIS — D689 Coagulation defect, unspecified: Secondary | ICD-10-CM | POA: Diagnosis not present

## 2015-09-28 DIAGNOSIS — D509 Iron deficiency anemia, unspecified: Secondary | ICD-10-CM | POA: Diagnosis not present

## 2015-09-28 DIAGNOSIS — N186 End stage renal disease: Secondary | ICD-10-CM | POA: Diagnosis not present

## 2015-09-30 DIAGNOSIS — D689 Coagulation defect, unspecified: Secondary | ICD-10-CM | POA: Diagnosis not present

## 2015-09-30 DIAGNOSIS — D509 Iron deficiency anemia, unspecified: Secondary | ICD-10-CM | POA: Diagnosis not present

## 2015-09-30 DIAGNOSIS — N2581 Secondary hyperparathyroidism of renal origin: Secondary | ICD-10-CM | POA: Diagnosis not present

## 2015-09-30 DIAGNOSIS — N186 End stage renal disease: Secondary | ICD-10-CM | POA: Diagnosis not present

## 2015-10-02 DIAGNOSIS — N186 End stage renal disease: Secondary | ICD-10-CM | POA: Diagnosis not present

## 2015-10-02 DIAGNOSIS — Z7982 Long term (current) use of aspirin: Secondary | ICD-10-CM | POA: Diagnosis not present

## 2015-10-02 DIAGNOSIS — Z905 Acquired absence of kidney: Secondary | ICD-10-CM | POA: Diagnosis not present

## 2015-10-02 DIAGNOSIS — D689 Coagulation defect, unspecified: Secondary | ICD-10-CM | POA: Diagnosis not present

## 2015-10-02 DIAGNOSIS — N289 Disorder of kidney and ureter, unspecified: Secondary | ICD-10-CM | POA: Diagnosis not present

## 2015-10-02 DIAGNOSIS — Z79899 Other long term (current) drug therapy: Secondary | ICD-10-CM | POA: Diagnosis not present

## 2015-10-02 DIAGNOSIS — I10 Essential (primary) hypertension: Secondary | ICD-10-CM | POA: Diagnosis not present

## 2015-10-02 DIAGNOSIS — Z888 Allergy status to other drugs, medicaments and biological substances status: Secondary | ICD-10-CM | POA: Diagnosis not present

## 2015-10-02 DIAGNOSIS — D509 Iron deficiency anemia, unspecified: Secondary | ICD-10-CM | POA: Diagnosis not present

## 2015-10-02 DIAGNOSIS — N2581 Secondary hyperparathyroidism of renal origin: Secondary | ICD-10-CM | POA: Diagnosis not present

## 2015-10-02 DIAGNOSIS — Z886 Allergy status to analgesic agent status: Secondary | ICD-10-CM | POA: Diagnosis not present

## 2015-10-05 DIAGNOSIS — D509 Iron deficiency anemia, unspecified: Secondary | ICD-10-CM | POA: Diagnosis not present

## 2015-10-05 DIAGNOSIS — N186 End stage renal disease: Secondary | ICD-10-CM | POA: Diagnosis not present

## 2015-10-05 DIAGNOSIS — D689 Coagulation defect, unspecified: Secondary | ICD-10-CM | POA: Diagnosis not present

## 2015-10-05 DIAGNOSIS — N2581 Secondary hyperparathyroidism of renal origin: Secondary | ICD-10-CM | POA: Diagnosis not present

## 2015-10-06 DIAGNOSIS — L438 Other lichen planus: Secondary | ICD-10-CM | POA: Diagnosis not present

## 2015-10-06 DIAGNOSIS — Z992 Dependence on renal dialysis: Secondary | ICD-10-CM | POA: Diagnosis not present

## 2015-10-06 DIAGNOSIS — N186 End stage renal disease: Secondary | ICD-10-CM | POA: Diagnosis not present

## 2015-10-06 DIAGNOSIS — N033 Chronic nephritic syndrome with diffuse mesangial proliferative glomerulonephritis: Secondary | ICD-10-CM | POA: Diagnosis not present

## 2015-10-07 DIAGNOSIS — D631 Anemia in chronic kidney disease: Secondary | ICD-10-CM | POA: Diagnosis not present

## 2015-10-07 DIAGNOSIS — N186 End stage renal disease: Secondary | ICD-10-CM | POA: Diagnosis not present

## 2015-10-07 DIAGNOSIS — D509 Iron deficiency anemia, unspecified: Secondary | ICD-10-CM | POA: Diagnosis not present

## 2015-10-07 DIAGNOSIS — N2581 Secondary hyperparathyroidism of renal origin: Secondary | ICD-10-CM | POA: Diagnosis not present

## 2015-10-07 DIAGNOSIS — D689 Coagulation defect, unspecified: Secondary | ICD-10-CM | POA: Diagnosis not present

## 2015-10-07 DIAGNOSIS — N183 Chronic kidney disease, stage 3 unspecified: Secondary | ICD-10-CM

## 2015-10-07 HISTORY — DX: Chronic kidney disease, stage 3 unspecified: N18.30

## 2015-10-09 DIAGNOSIS — D631 Anemia in chronic kidney disease: Secondary | ICD-10-CM | POA: Diagnosis not present

## 2015-10-09 DIAGNOSIS — N186 End stage renal disease: Secondary | ICD-10-CM | POA: Diagnosis not present

## 2015-10-09 DIAGNOSIS — N2581 Secondary hyperparathyroidism of renal origin: Secondary | ICD-10-CM | POA: Diagnosis not present

## 2015-10-09 DIAGNOSIS — D689 Coagulation defect, unspecified: Secondary | ICD-10-CM | POA: Diagnosis not present

## 2015-10-09 DIAGNOSIS — D509 Iron deficiency anemia, unspecified: Secondary | ICD-10-CM | POA: Diagnosis not present

## 2015-10-12 DIAGNOSIS — D689 Coagulation defect, unspecified: Secondary | ICD-10-CM | POA: Diagnosis not present

## 2015-10-12 DIAGNOSIS — Z940 Kidney transplant status: Secondary | ICD-10-CM | POA: Diagnosis not present

## 2015-10-12 DIAGNOSIS — N2581 Secondary hyperparathyroidism of renal origin: Secondary | ICD-10-CM | POA: Diagnosis not present

## 2015-10-12 DIAGNOSIS — D509 Iron deficiency anemia, unspecified: Secondary | ICD-10-CM | POA: Diagnosis not present

## 2015-10-12 DIAGNOSIS — N186 End stage renal disease: Secondary | ICD-10-CM | POA: Diagnosis not present

## 2015-10-12 DIAGNOSIS — D631 Anemia in chronic kidney disease: Secondary | ICD-10-CM | POA: Diagnosis not present

## 2015-10-14 DIAGNOSIS — N2581 Secondary hyperparathyroidism of renal origin: Secondary | ICD-10-CM | POA: Diagnosis not present

## 2015-10-14 DIAGNOSIS — N186 End stage renal disease: Secondary | ICD-10-CM | POA: Diagnosis not present

## 2015-10-14 DIAGNOSIS — D689 Coagulation defect, unspecified: Secondary | ICD-10-CM | POA: Diagnosis not present

## 2015-10-14 DIAGNOSIS — D631 Anemia in chronic kidney disease: Secondary | ICD-10-CM | POA: Diagnosis not present

## 2015-10-14 DIAGNOSIS — D509 Iron deficiency anemia, unspecified: Secondary | ICD-10-CM | POA: Diagnosis not present

## 2015-10-16 DIAGNOSIS — D689 Coagulation defect, unspecified: Secondary | ICD-10-CM | POA: Diagnosis not present

## 2015-10-16 DIAGNOSIS — N186 End stage renal disease: Secondary | ICD-10-CM | POA: Diagnosis not present

## 2015-10-16 DIAGNOSIS — D631 Anemia in chronic kidney disease: Secondary | ICD-10-CM | POA: Diagnosis not present

## 2015-10-16 DIAGNOSIS — N2581 Secondary hyperparathyroidism of renal origin: Secondary | ICD-10-CM | POA: Diagnosis not present

## 2015-10-16 DIAGNOSIS — D509 Iron deficiency anemia, unspecified: Secondary | ICD-10-CM | POA: Diagnosis not present

## 2015-10-19 DIAGNOSIS — D509 Iron deficiency anemia, unspecified: Secondary | ICD-10-CM | POA: Diagnosis not present

## 2015-10-19 DIAGNOSIS — N2581 Secondary hyperparathyroidism of renal origin: Secondary | ICD-10-CM | POA: Diagnosis not present

## 2015-10-19 DIAGNOSIS — D631 Anemia in chronic kidney disease: Secondary | ICD-10-CM | POA: Diagnosis not present

## 2015-10-19 DIAGNOSIS — D689 Coagulation defect, unspecified: Secondary | ICD-10-CM | POA: Diagnosis not present

## 2015-10-19 DIAGNOSIS — N186 End stage renal disease: Secondary | ICD-10-CM | POA: Diagnosis not present

## 2015-10-21 DIAGNOSIS — D631 Anemia in chronic kidney disease: Secondary | ICD-10-CM | POA: Diagnosis not present

## 2015-10-21 DIAGNOSIS — D689 Coagulation defect, unspecified: Secondary | ICD-10-CM | POA: Diagnosis not present

## 2015-10-21 DIAGNOSIS — D509 Iron deficiency anemia, unspecified: Secondary | ICD-10-CM | POA: Diagnosis not present

## 2015-10-21 DIAGNOSIS — N186 End stage renal disease: Secondary | ICD-10-CM | POA: Diagnosis not present

## 2015-10-21 DIAGNOSIS — N2581 Secondary hyperparathyroidism of renal origin: Secondary | ICD-10-CM | POA: Diagnosis not present

## 2015-10-23 DIAGNOSIS — N2581 Secondary hyperparathyroidism of renal origin: Secondary | ICD-10-CM | POA: Diagnosis not present

## 2015-10-23 DIAGNOSIS — D689 Coagulation defect, unspecified: Secondary | ICD-10-CM | POA: Diagnosis not present

## 2015-10-23 DIAGNOSIS — D509 Iron deficiency anemia, unspecified: Secondary | ICD-10-CM | POA: Diagnosis not present

## 2015-10-23 DIAGNOSIS — D631 Anemia in chronic kidney disease: Secondary | ICD-10-CM | POA: Diagnosis not present

## 2015-10-23 DIAGNOSIS — N186 End stage renal disease: Secondary | ICD-10-CM | POA: Diagnosis not present

## 2015-10-26 DIAGNOSIS — D509 Iron deficiency anemia, unspecified: Secondary | ICD-10-CM | POA: Diagnosis not present

## 2015-10-26 DIAGNOSIS — D631 Anemia in chronic kidney disease: Secondary | ICD-10-CM | POA: Diagnosis not present

## 2015-10-26 DIAGNOSIS — D689 Coagulation defect, unspecified: Secondary | ICD-10-CM | POA: Diagnosis not present

## 2015-10-26 DIAGNOSIS — N2581 Secondary hyperparathyroidism of renal origin: Secondary | ICD-10-CM | POA: Diagnosis not present

## 2015-10-26 DIAGNOSIS — N186 End stage renal disease: Secondary | ICD-10-CM | POA: Diagnosis not present

## 2015-10-28 DIAGNOSIS — D689 Coagulation defect, unspecified: Secondary | ICD-10-CM | POA: Diagnosis not present

## 2015-10-28 DIAGNOSIS — D509 Iron deficiency anemia, unspecified: Secondary | ICD-10-CM | POA: Diagnosis not present

## 2015-10-28 DIAGNOSIS — D631 Anemia in chronic kidney disease: Secondary | ICD-10-CM | POA: Diagnosis not present

## 2015-10-28 DIAGNOSIS — N186 End stage renal disease: Secondary | ICD-10-CM | POA: Diagnosis not present

## 2015-10-28 DIAGNOSIS — N2581 Secondary hyperparathyroidism of renal origin: Secondary | ICD-10-CM | POA: Diagnosis not present

## 2015-10-29 ENCOUNTER — Telehealth: Payer: Self-pay

## 2015-10-29 NOTE — Telephone Encounter (Signed)
Recd faxed rx refill request for levothyroxine 155mcg tab from [facility] ([address] [place])----routing refill request to patient's new pcp, dr Anitra Lauth

## 2015-10-30 DIAGNOSIS — D509 Iron deficiency anemia, unspecified: Secondary | ICD-10-CM | POA: Diagnosis not present

## 2015-10-30 DIAGNOSIS — N2581 Secondary hyperparathyroidism of renal origin: Secondary | ICD-10-CM | POA: Diagnosis not present

## 2015-10-30 DIAGNOSIS — D631 Anemia in chronic kidney disease: Secondary | ICD-10-CM | POA: Diagnosis not present

## 2015-10-30 DIAGNOSIS — N186 End stage renal disease: Secondary | ICD-10-CM | POA: Diagnosis not present

## 2015-10-30 DIAGNOSIS — D689 Coagulation defect, unspecified: Secondary | ICD-10-CM | POA: Diagnosis not present

## 2015-11-02 ENCOUNTER — Encounter: Payer: Self-pay | Admitting: Family Medicine

## 2015-11-02 DIAGNOSIS — D631 Anemia in chronic kidney disease: Secondary | ICD-10-CM | POA: Diagnosis not present

## 2015-11-02 DIAGNOSIS — D509 Iron deficiency anemia, unspecified: Secondary | ICD-10-CM | POA: Diagnosis not present

## 2015-11-02 DIAGNOSIS — N186 End stage renal disease: Secondary | ICD-10-CM | POA: Diagnosis not present

## 2015-11-02 DIAGNOSIS — N2581 Secondary hyperparathyroidism of renal origin: Secondary | ICD-10-CM | POA: Diagnosis not present

## 2015-11-02 DIAGNOSIS — D689 Coagulation defect, unspecified: Secondary | ICD-10-CM | POA: Diagnosis not present

## 2015-11-02 MED ORDER — LEVOTHYROXINE SODIUM 175 MCG PO TABS: ORAL_TABLET | ORAL | Status: DC

## 2015-11-02 NOTE — Telephone Encounter (Signed)
RF request for levothyroxine LOV: 07/07/15 Next ov: None Last written: 11/11/14 #32 w/ 5RF  07/16/15 TSH 20.10  Please advise. Thanks.

## 2015-11-02 NOTE — Telephone Encounter (Signed)
Pt advised and voiced understanding.  Apt made for 11/05/15 at 11:00am.

## 2015-11-02 NOTE — Telephone Encounter (Signed)
Will send in 1 mo supply of her synthroid, but she is due for office f/u and recheck of her thyroid lab sometime in the next month.-thx

## 2015-11-04 DIAGNOSIS — N186 End stage renal disease: Secondary | ICD-10-CM | POA: Diagnosis not present

## 2015-11-04 DIAGNOSIS — N2581 Secondary hyperparathyroidism of renal origin: Secondary | ICD-10-CM | POA: Diagnosis not present

## 2015-11-04 DIAGNOSIS — D509 Iron deficiency anemia, unspecified: Secondary | ICD-10-CM | POA: Diagnosis not present

## 2015-11-04 DIAGNOSIS — D631 Anemia in chronic kidney disease: Secondary | ICD-10-CM | POA: Diagnosis not present

## 2015-11-04 DIAGNOSIS — D689 Coagulation defect, unspecified: Secondary | ICD-10-CM | POA: Diagnosis not present

## 2015-11-05 ENCOUNTER — Ambulatory Visit (INDEPENDENT_AMBULATORY_CARE_PROVIDER_SITE_OTHER): Payer: Medicare Other | Admitting: Family Medicine

## 2015-11-05 ENCOUNTER — Encounter: Payer: Self-pay | Admitting: Family Medicine

## 2015-11-05 VITALS — BP 154/84 | HR 75 | Temp 98.3°F | Resp 16 | Ht 65.5 in | Wt 214.5 lb

## 2015-11-05 DIAGNOSIS — E039 Hypothyroidism, unspecified: Secondary | ICD-10-CM | POA: Diagnosis not present

## 2015-11-05 DIAGNOSIS — I120 Hypertensive chronic kidney disease with stage 5 chronic kidney disease or end stage renal disease: Secondary | ICD-10-CM | POA: Diagnosis not present

## 2015-11-05 DIAGNOSIS — Z01810 Encounter for preprocedural cardiovascular examination: Secondary | ICD-10-CM | POA: Diagnosis not present

## 2015-11-05 NOTE — Progress Notes (Signed)
OFFICE VISIT  11/05/2015   CC:  Chief Complaint  Patient presents with  . Follow-up    thyroid   HPI:    Patient is a 70 y.o. Caucasian female who presents for 4 mo f/u hypothyroidism. Last TSH 07/16/15 was 20.1 but pt admitted she was only partially compliant with her levothyroxine so we did not change the dose, just encouraged her to be more compliant.  She was able to be a little more compliant but still not well enough to recheck TSH and have it be a meaningful number: average of 3 doses missed per week, plus she went w/out the med for 1 week and got back on it again about a week ago.  She says she waits until after dialysis to take it and this makes her sometimes forget it.  She was told that she may have a kidney today or tomorrow--for transplant.  She is excited and scared.   Past Medical History  Diagnosis Date  . Hypertension   . Hypothyroidism   . Gout   . Arthritis   . Anemia     r/t kidney function- receives Procrit  . Cancer ([place])     kidney  . FSGS (focal segmental glomerulosclerosis)     right kidney;  . Osteopenia   . Diverticulosis     a. 05/2012 colonoscopy  . CKD (chronic kidney disease)     followed by Dr. [name].  . SVT (supraventricular tachycardia) ([place])   . GERD (gastroesophageal reflux disease)   . Hiatal hernia     Past Surgical History  Procedure Laterality Date  . Total abdominal hysterectomy w/ bilateral salpingoophorectomy      fibroids  . Nephrectomy  1993    for malignancy- left  . Colonoscopy with polypectomy      Dr Henrene Pastor  . Av fistula placement Right     Hemodyalisis initiated 2014; schedule is M/W/F [place]  . Renal biopsy      right  . Colonoscopy  2003; 05/2012    diverticulosis, no polyps.  Recall 10 yrs  . Tee without cardioversion N/A 08/27/2013    Procedure: TRANSESOPHAGEAL ECHOCARDIOGRAM (TEE);  Surgeon: [name], MD;  Location: [facility] ENDOSCOPY;  Service: Cardiovascular;  Laterality: N/A;  . Vulva / perineum  biopsy  2015    Outpatient Prescriptions Prior to Visit  Medication Sig Dispense Refill  . acetaminophen (TYLENOL) 500 MG tablet Take 500-750 mg by mouth every 6 (six) hours as needed for pain.    [name] aspirin EC 81 MG EC tablet Take 1 tablet (81 mg total) by mouth daily.    Skipper Cliche SALINE NASAL DROPS NA Place 2 sprays into both nostrils daily.    . Camphor-Eucalyptus-Menthol (VICKS VAPORUB EX) Apply 1 application topically daily.    . clobetasol ointment (TEMOVATE) [id number] % Apply 1 application topically 2 (two) times daily. Apply to affected area for two weeks then once a day for 2 weeks. 30 g 2  . fluticasone (FLONASE) 50 MCG/ACT nasal spray Place 1 spray into the nose 2 (two) times daily as needed for rhinitis. 16 g 2  . levothyroxine (SYNTHROID, LEVOTHROID) 175 MCG tablet take 1 tablet by mouth once daily EXCEPT ON WEDNESDAY TAKE 1AND1/2 tablets 32 tablet 0  . lidocaine-prilocaine (EMLA) cream APPLY SMALL AMOUNT TO ACCESS SITE 1 TO 2 HOURS BEFORE DIALYSIS. COVER WITH OCCLUSIVE DRESSING (SARAN WRAP).  6  . loratadine (CLARITIN) 10 MG tablet Take 20 mg by mouth as needed for allergies.    [name]  meclizine (ANTIVERT) 25 MG tablet Take 1 tablet (25 mg total) by mouth 3 (three) times daily as needed for dizziness or nausea. 20 tablet 0  . multivitamin (RENA-VIT) TABS tablet take 1 tablet by mouth daily 30 tablet 5  . propranolol (INDERAL) 40 MG tablet take 1 tablet by mouth once daily ON TUES, THURS, SAT, AND SUN. AND TWICE DAILY MON., WED, AND FRI. 44 tablet 5  . SENSIPAR 30 MG tablet Take 30 mg by mouth daily.     . sevelamer carbonate (RENVELA) 800 MG tablet Take 800 mg by mouth 3 (three) times daily with meals.    . verapamil (CALAN-SR) 120 MG CR tablet Take 1 tablet (120 mg total) by mouth daily. 90 tablet 1  . levofloxacin (LEVAQUIN) 500 MG tablet 750 mg (1.5 tabs) day 1, then 500 mg every 48 hours (Patient not taking: Reported on 11/05/2015) 6 tablet 0   No facility-administered medications prior to  visit.    Allergies  Allergen Reactions  . Cefaclor Rash  . Naproxen Rash  . Enalapril Maleate Cough  . Metoprolol Tartrate Rash    On legs    ROS As per HPI  PE: Blood pressure 154/84, pulse 75, temperature 98.3 F (36.8 C), temperature source Oral, resp. rate 16, height 5' 5.5" (1.664 m), weight 214 lb 8 oz (97.297 kg), SpO2 94 %. Gen: Alert, well appearing.  Patient is oriented to person, place, time, and situation. No further exam today.  LABS:  Lab Results  Component Value Date   TSH 20.10* 07/16/2015   Lab Results  Component Value Date   CHOL 172 07/16/2015   HDL 46.60 07/16/2015   LDLCALC 98 07/16/2015   LDLDIRECT 166.3 10/02/2007   TRIG 135.0 07/16/2015   CHOLHDL 4 07/16/2015     Chemistry      Component Value Date/Time   NA 140 09/16/2015 1515   K 3.8 09/16/2015 1515   CL 96* 09/16/2015 1515   CO2 33* 09/16/2015 1515   BUN 12 09/16/2015 1515   CREATININE 3.61* 09/16/2015 1515   CREATININE 3.84* 05/06/2013 1630      Component Value Date/Time   CALCIUM 8.6 09/16/2015 1515   CALCIUM 8.4 08/30/2012 0819   ALKPHOS 71 12/14/2012 0415   AST 24 12/14/2012 0415   ALT 21 12/14/2012 0415   BILITOT 0.3 12/14/2012 0415     Lab Results  Component Value Date   WBC 5.5 09/16/2015   HGB 11.1* 09/16/2015   HCT 33.1* 09/16/2015   MCV 94.8 09/16/2015   PLT 179 09/16/2015    IMPRESSION AND PLAN:  Hypothyroidism: noncompliant with levothyroxine.  Will put off TSH recheck today and have her return in 3 mo for o/v and recheck TSH.  I'll call her nephrologist at her dialysis center and see if she can take the levothyroxine w/out regard to dialysis.  This would allow her to get on a schedule of taking the med every morning on empty stomach.  Paged Dr. Lorrene Reid.  She did not return my call so I'll call her again.  An After Visit Summary was printed and given to the patient.  FOLLOW UP: Return in about 3 months (around 02/05/2016) for routine chronic illness f/u (30  min).  Signed:  Crissie Sickles, MD           11/05/2015

## 2015-11-05 NOTE — Progress Notes (Signed)
Pre visit review using our clinic review tool, if applicable. No additional management support is needed unless otherwise documented below in the visit note. 

## 2015-11-06 DIAGNOSIS — Z992 Dependence on renal dialysis: Secondary | ICD-10-CM | POA: Diagnosis not present

## 2015-11-06 DIAGNOSIS — Z940 Kidney transplant status: Secondary | ICD-10-CM

## 2015-11-06 DIAGNOSIS — N186 End stage renal disease: Secondary | ICD-10-CM | POA: Diagnosis not present

## 2015-11-06 DIAGNOSIS — N033 Chronic nephritic syndrome with diffuse mesangial proliferative glomerulonephritis: Secondary | ICD-10-CM | POA: Diagnosis not present

## 2015-11-06 HISTORY — DX: Kidney transplant status: Z94.0

## 2015-11-06 HISTORY — PX: KIDNEY TRANSPLANT: SHX239

## 2015-11-11 ENCOUNTER — Other Ambulatory Visit: Payer: Self-pay | Admitting: *Deleted

## 2015-11-11 MED ORDER — VERAPAMIL HCL ER 120 MG PO TBCR: 120.0000 mg | EXTENDED_RELEASE_TABLET | Freq: Every day | ORAL | Status: DC

## 2015-11-11 NOTE — Telephone Encounter (Signed)
RF request for verapamil LOV: 10/26/15 Next ov: None Last written: 12/15/14 #90 w/ 1RF

## 2015-11-13 DIAGNOSIS — E785 Hyperlipidemia, unspecified: Secondary | ICD-10-CM | POA: Diagnosis not present

## 2015-11-13 DIAGNOSIS — Z79899 Other long term (current) drug therapy: Secondary | ICD-10-CM | POA: Diagnosis not present

## 2015-11-13 DIAGNOSIS — Z940 Kidney transplant status: Secondary | ICD-10-CM | POA: Diagnosis not present

## 2015-11-13 DIAGNOSIS — Z139 Encounter for screening, unspecified: Secondary | ICD-10-CM | POA: Diagnosis not present

## 2015-11-13 DIAGNOSIS — E039 Hypothyroidism, unspecified: Secondary | ICD-10-CM | POA: Diagnosis not present

## 2015-11-20 DIAGNOSIS — Z940 Kidney transplant status: Secondary | ICD-10-CM | POA: Diagnosis not present

## 2015-11-24 DIAGNOSIS — B259 Cytomegaloviral disease, unspecified: Secondary | ICD-10-CM | POA: Diagnosis not present

## 2015-11-24 DIAGNOSIS — Z79899 Other long term (current) drug therapy: Secondary | ICD-10-CM | POA: Diagnosis not present

## 2015-11-24 DIAGNOSIS — Z940 Kidney transplant status: Secondary | ICD-10-CM | POA: Diagnosis not present

## 2015-11-24 DIAGNOSIS — R799 Abnormal finding of blood chemistry, unspecified: Secondary | ICD-10-CM | POA: Diagnosis not present

## 2015-12-01 DIAGNOSIS — Z940 Kidney transplant status: Secondary | ICD-10-CM | POA: Diagnosis not present

## 2015-12-01 DIAGNOSIS — Z79899 Other long term (current) drug therapy: Secondary | ICD-10-CM | POA: Diagnosis not present

## 2015-12-01 DIAGNOSIS — R799 Abnormal finding of blood chemistry, unspecified: Secondary | ICD-10-CM | POA: Diagnosis not present

## 2015-12-01 DIAGNOSIS — B259 Cytomegaloviral disease, unspecified: Secondary | ICD-10-CM | POA: Diagnosis not present

## 2015-12-02 DIAGNOSIS — N186 End stage renal disease: Secondary | ICD-10-CM | POA: Diagnosis not present

## 2015-12-07 DIAGNOSIS — B259 Cytomegaloviral disease, unspecified: Secondary | ICD-10-CM | POA: Diagnosis not present

## 2015-12-07 DIAGNOSIS — Z79899 Other long term (current) drug therapy: Secondary | ICD-10-CM | POA: Diagnosis not present

## 2015-12-07 DIAGNOSIS — Z940 Kidney transplant status: Secondary | ICD-10-CM | POA: Diagnosis not present

## 2015-12-07 DIAGNOSIS — R799 Abnormal finding of blood chemistry, unspecified: Secondary | ICD-10-CM | POA: Diagnosis not present

## 2015-12-10 DIAGNOSIS — Z79899 Other long term (current) drug therapy: Secondary | ICD-10-CM | POA: Diagnosis not present

## 2015-12-10 DIAGNOSIS — Z940 Kidney transplant status: Secondary | ICD-10-CM | POA: Diagnosis not present

## 2015-12-10 DIAGNOSIS — R799 Abnormal finding of blood chemistry, unspecified: Secondary | ICD-10-CM | POA: Diagnosis not present

## 2015-12-10 DIAGNOSIS — B259 Cytomegaloviral disease, unspecified: Secondary | ICD-10-CM | POA: Diagnosis not present

## 2015-12-14 DIAGNOSIS — B259 Cytomegaloviral disease, unspecified: Secondary | ICD-10-CM | POA: Diagnosis not present

## 2015-12-14 DIAGNOSIS — R799 Abnormal finding of blood chemistry, unspecified: Secondary | ICD-10-CM | POA: Diagnosis not present

## 2015-12-14 DIAGNOSIS — Z940 Kidney transplant status: Secondary | ICD-10-CM | POA: Diagnosis not present

## 2015-12-14 DIAGNOSIS — Z79899 Other long term (current) drug therapy: Secondary | ICD-10-CM | POA: Diagnosis not present

## 2015-12-21 DIAGNOSIS — Z79899 Other long term (current) drug therapy: Secondary | ICD-10-CM | POA: Diagnosis not present

## 2015-12-21 DIAGNOSIS — R799 Abnormal finding of blood chemistry, unspecified: Secondary | ICD-10-CM | POA: Diagnosis not present

## 2015-12-21 DIAGNOSIS — B259 Cytomegaloviral disease, unspecified: Secondary | ICD-10-CM | POA: Diagnosis not present

## 2015-12-21 DIAGNOSIS — Z940 Kidney transplant status: Secondary | ICD-10-CM | POA: Diagnosis not present

## 2015-12-24 DIAGNOSIS — B259 Cytomegaloviral disease, unspecified: Secondary | ICD-10-CM | POA: Diagnosis not present

## 2015-12-24 DIAGNOSIS — R799 Abnormal finding of blood chemistry, unspecified: Secondary | ICD-10-CM | POA: Diagnosis not present

## 2015-12-24 DIAGNOSIS — Z79899 Other long term (current) drug therapy: Secondary | ICD-10-CM | POA: Diagnosis not present

## 2015-12-24 DIAGNOSIS — Z940 Kidney transplant status: Secondary | ICD-10-CM | POA: Diagnosis not present

## 2015-12-28 ENCOUNTER — Other Ambulatory Visit: Payer: Self-pay

## 2015-12-28 MED ORDER — LEVOTHYROXINE SODIUM 175 MCG PO TABS: ORAL_TABLET | ORAL | Status: DC

## 2015-12-29 ENCOUNTER — Encounter: Payer: Self-pay | Admitting: Family Medicine

## 2015-12-29 DIAGNOSIS — B259 Cytomegaloviral disease, unspecified: Secondary | ICD-10-CM | POA: Diagnosis not present

## 2015-12-29 DIAGNOSIS — Z79899 Other long term (current) drug therapy: Secondary | ICD-10-CM | POA: Diagnosis not present

## 2015-12-29 DIAGNOSIS — Z940 Kidney transplant status: Secondary | ICD-10-CM | POA: Diagnosis not present

## 2015-12-29 DIAGNOSIS — R799 Abnormal finding of blood chemistry, unspecified: Secondary | ICD-10-CM | POA: Diagnosis not present

## 2015-12-30 ENCOUNTER — Encounter: Payer: Self-pay | Admitting: Family Medicine

## 2015-12-31 DIAGNOSIS — B259 Cytomegaloviral disease, unspecified: Secondary | ICD-10-CM | POA: Diagnosis not present

## 2015-12-31 DIAGNOSIS — R799 Abnormal finding of blood chemistry, unspecified: Secondary | ICD-10-CM | POA: Diagnosis not present

## 2015-12-31 DIAGNOSIS — Z940 Kidney transplant status: Secondary | ICD-10-CM | POA: Diagnosis not present

## 2015-12-31 DIAGNOSIS — Z79899 Other long term (current) drug therapy: Secondary | ICD-10-CM | POA: Diagnosis not present

## 2016-01-05 DIAGNOSIS — Z79899 Other long term (current) drug therapy: Secondary | ICD-10-CM | POA: Diagnosis not present

## 2016-01-05 DIAGNOSIS — R799 Abnormal finding of blood chemistry, unspecified: Secondary | ICD-10-CM | POA: Diagnosis not present

## 2016-01-05 DIAGNOSIS — Z940 Kidney transplant status: Secondary | ICD-10-CM | POA: Diagnosis not present

## 2016-01-05 DIAGNOSIS — B259 Cytomegaloviral disease, unspecified: Secondary | ICD-10-CM | POA: Diagnosis not present

## 2016-01-07 ENCOUNTER — Emergency Department (INDEPENDENT_AMBULATORY_CARE_PROVIDER_SITE_OTHER)
Admission: EM | Admit: 2016-01-07 | Discharge: 2016-01-07 | Disposition: A | Discharge status: Home / Self Care | Payer: Medicare Other | Source: Home / Self Care | Attending: Family Medicine | Admitting: Family Medicine

## 2016-01-07 ENCOUNTER — Telehealth: Payer: Self-pay

## 2016-01-07 ENCOUNTER — Encounter: Payer: Self-pay | Admitting: Emergency Medicine

## 2016-01-07 ENCOUNTER — Encounter: Payer: Self-pay | Admitting: Vascular Surgery

## 2016-01-07 ENCOUNTER — Ambulatory Visit (INDEPENDENT_AMBULATORY_CARE_PROVIDER_SITE_OTHER): Payer: Medicare Other | Admitting: Vascular Surgery

## 2016-01-07 VITALS — BP 125/80 | HR 76 | Ht 65.0 in | Wt 203.3 lb

## 2016-01-07 DIAGNOSIS — T82590A Other mechanical complication of surgically created arteriovenous fistula, initial encounter: Secondary | ICD-10-CM

## 2016-01-07 DIAGNOSIS — T82898A Other specified complication of vascular prosthetic devices, implants and grafts, initial encounter: Secondary | ICD-10-CM

## 2016-01-07 NOTE — Progress Notes (Signed)
Patient is a 70 year old female who presented to the office today complaining of progressive enlargement of her right arm AV fistula. She was previously evaluated for aneurysms in this fistula by Dr. Scot Dock approximate 2 years ago. At that point she did not really have thinned out skin and the aneurysms were not thought to be high risk. She received a kidney transplant a few months ago and is no longer using the fistula. However she states that over the last few days she has noted that the more proximal aspect of the fistula has grown considerably. She does not describe any pain. She has had no bleeding. The fistula is not currently being used.  Past Medical History  Diagnosis Date  . Hypertension   . Hypothyroidism   . Gout   . Arthritis   . Anemia     r/t kidney function- receives Procrit  . Cancer ([place])     kidney  . FSGS (focal segmental glomerulosclerosis)     right kidney; hx of left renal cell cancer and got nephrectomy 1993.  . Osteopenia   . Diverticulosis     a. 05/2012 colonoscopy  . ESRD (end stage renal disease) ([place])     began dialysis 2014--followed by Dr. [name].  Received deceased donor kidney transplant 10/2015.  [name] SVT (supraventricular tachycardia) ([place])   . GERD (gastroesophageal reflux disease)   . Hiatal hernia   . History of renal cell cancer 1993    Left nephrectomy    Past Surgical History  Procedure Laterality Date  . Total abdominal hysterectomy w/ bilateral salpingoophorectomy      fibroids  . Nephrectomy  1993    for malignancy- left  . Colonoscopy with polypectomy      Dr Henrene Pastor  . Av fistula placement Right     Hemodyalisis initiated 2014; schedule is M/W/F [place]  . Renal biopsy      right  . Colonoscopy  2003; 05/2012    diverticulosis, no polyps.  Recall 10 yrs  . Tee without cardioversion N/A 08/27/2013    Procedure: TRANSESOPHAGEAL ECHOCARDIOGRAM (TEE);  Surgeon: [name], MD;  Location: [facility];  Service:  Cardiovascular;  Laterality: N/A;  . Vulva / perineum biopsy  2015  . Kidney transplant  11/06/15    Thymoglobulin induction     Physical exam:  Filed Vitals:   01/07/16 1557  BP: 125/80  Pulse: 76  Height: 5\' 5"  (1.651 m)  Weight: 203 lb 4.8 oz (92.216 kg)  SpO2: 97%    Right upper extremity: Diffuse aneurysmal dilatation of the proximal 7 cm of a right upper arm AV fistula. The diameter of the fistula is fairly uniform at about 4 cm. There is a skip area followed by an additional aneurysm that again is about 4-5 cm in diameter that extends over about 3 cm. There is a palpable thrill in the fistula. The skin is not thinned out over the aneurysms. There is no focal component to the aneurysm suggesting peri-anastomotic disruption. There is also no pain over the fistula.  Assessment: Chronic aneurysmal degeneration right upper arm AV fistula. This does not appear to have any risk of bleeding currently. I discussed with the patient that frequently these fistulas will degenerate and become aneurysmal just as this one has become. There usually is no significant risk of bleeding especially since the fistula is not currently being used. If the fistula continues to grow over time the patient experiences pain or there is a focal dilation we  would consider ligation of the fistula. However, I would not plan on doing this as she may need to fistula some point in the future if her kidney fails. To reassure the patient I did schedule her for a follow-up appointment in 6 months to make sure that it has had no further changes.  Ruta Hinds, MD [facility] Office: [phone] Pager: [phone]

## 2016-01-07 NOTE — Telephone Encounter (Signed)
-----   Message from Georgiann Mccoy sent at 01/07/2016  9:26 AM EDT ----- Regarding: pt called back This pt called again. She said she had left triage a message but called back wanting to make an appt for today.  She hasn't been seen in 2 years and just said her graft looked "weird".  I told her that I would send you an additional message and that you would call her back.

## 2016-01-07 NOTE — ED Notes (Signed)
Patient here for new area of venous lump on upper right arm at site of vein graft for dialysis; had kidney transplant 11-06-15 with last dialysis 11-04-15. Denies pain at site.

## 2016-01-07 NOTE — ED Provider Notes (Signed)
CSN: [id number]     Arrival date & time 01/07/16  1152 History   First MD Initiated Contact with Patient 01/07/16 1249     Chief Complaint  Patient presents with  . Mass     HPI Comments: Patient has a history of end stage renal disease, s/p kidney transplant 11/06/15.  Her AV fistula right upper arm has not been used since her last dialysis 11/04/15.  She states that during the past several days, her AV fistula has become significantly longer without pain, erythema, or swelling.  The history is provided by the patient.    Past Medical History  Diagnosis Date  . Hypertension   . Hypothyroidism   . Gout   . Arthritis   . Anemia     r/t kidney function- receives Procrit  . Cancer ([place])     kidney  . FSGS (focal segmental glomerulosclerosis)     right kidney; hx of left renal cell cancer and got nephrectomy 1993.  . Osteopenia   . Diverticulosis     a. 05/2012 colonoscopy  . ESRD (end stage renal disease) ([place])     began dialysis 2014--followed by Dr. [name].  Received deceased donor kidney transplant 10/2015.  [name] SVT (supraventricular tachycardia) ([place])   . GERD (gastroesophageal reflux disease)   . Hiatal hernia   . History of renal cell cancer 1993    Left nephrectomy   Past Surgical History  Procedure Laterality Date  . Total abdominal hysterectomy w/ bilateral salpingoophorectomy      fibroids  . Nephrectomy  1993    for malignancy- left  . Colonoscopy with polypectomy      Dr Henrene Pastor  . Av fistula placement Right     Hemodyalisis initiated 2014; schedule is M/W/F [place]  . Renal biopsy      right  . Colonoscopy  2003; 05/2012    diverticulosis, no polyps.  Recall 10 yrs  . Tee without cardioversion N/A 08/27/2013    Procedure: TRANSESOPHAGEAL ECHOCARDIOGRAM (TEE);  Surgeon: [name], MD;  Location: [place];  Service: Cardiovascular;  Laterality: N/A;  . Vulva / perineum biopsy  2015  . Kidney transplant  11/06/15    Thymoglobulin induction    Family History  Problem Relation Age of Onset  . Deep vein thrombosis Mother     post thyroid surgery  . Hypertension Mother   . Heart attack Father 73    deceased  . Hypertension Father   . Cancer Sister     breast  . Cancer Paternal Aunt     pancreatic  . Diabetes Maternal Grandfather   . Stroke Paternal Grandmother     in 98s  . Colon cancer Neg Hx   . Esophageal cancer Neg Hx   . Stomach cancer Neg Hx   . Rectal cancer Neg Hx    Social History  Substance Use Topics  . Smoking status: Never Smoker   . Smokeless tobacco: Never Used  . Alcohol Use: Yes     Comment: rarely   OB History    Gravida Para Term Preterm AB TAB SAB Ectopic Multiple Living   2 2 2  0 0 0 0 0 0 2     Review of Systems  Constitutional: Negative for fever, chills, diaphoresis and fatigue.  Skin: Negative for color change.  All other systems reviewed and are negative.   Allergies  Cefaclor; Naproxen; Enalapril maleate; and Metoprolol tartrate  Home Medications   Prior to Admission medications  Medication Sig Start Date End Date Taking? Authorizing Provider  acetaminophen (TYLENOL) 500 MG tablet Take 500-750 mg by mouth every 6 (six) hours as needed for pain.    Historical Provider, MD  aspirin EC 81 MG EC tablet Take 1 tablet (81 mg total) by mouth daily. 12/09/12   Brooke O Edmisten, PA-C  AYR SALINE NASAL DROPS NA Place 2 sprays into both nostrils daily.    Historical Provider, MD  Camphor-Eucalyptus-Menthol (VICKS VAPORUB EX) Apply 1 application topically daily.    Historical Provider, MD  clobetasol ointment (TEMOVATE) [id number] % Apply 1 application topically 2 (two) times daily. Apply to affected area for two weeks then once a day for 2 weeks. 06/22/14   Guss Bunde, MD  fluticasone (FLONASE) 50 MCG/ACT nasal spray Place 1 spray into the nose 2 (two) times daily as needed for rhinitis. 05/16/13   Hendricks Limes, MD  levothyroxine (SYNTHROID, LEVOTHROID) 175 MCG tablet take 1 tablet by  mouth once daily EXCEPT ON [facility] TAKE 1AND1/2 tablets 12/28/15   Tammi Sou, MD  lidocaine-prilocaine (EMLA) cream APPLY SMALL AMOUNT TO ACCESS SITE 1 TO 2 HOURS BEFORE DIALYSIS. COVER WITH OCCLUSIVE DRESSING (SARAN WRAP). 08/28/15   Historical Provider, MD  loratadine (CLARITIN) 10 MG tablet Take 20 mg by mouth as needed for allergies.    Historical Provider, MD  meclizine (ANTIVERT) 25 MG tablet Take 1 tablet (25 mg total) by mouth 3 (three) times daily as needed for dizziness or nausea. 07/30/15   Noland Fordyce, PA-C  multivitamin (RENA-VIT) TABS tablet take 1 tablet by mouth daily 07/28/14   Hendricks Limes, MD  propranolol (INDERAL) 40 MG tablet take 1 tablet by mouth once daily ON TUES, THURS, SAT, AND SUN. AND TWICE DAILY MON., WED, AND FRI. 01/29/15   Hendricks Limes, MD  SENSIPAR 30 MG tablet Take 30 mg by mouth daily.  09/07/15   Historical Provider, MD  sevelamer carbonate (RENVELA) 800 MG tablet Take 800 mg by mouth 3 (three) times daily with meals.    Historical Provider, MD  verapamil (CALAN-SR) 120 MG CR tablet Take 1 tablet (120 mg total) by mouth daily. 11/11/15   Tammi Sou, MD   Meds Ordered and Administered this Visit  Medications - No data to display  BP 120/73 mmHg  Pulse 76  Temp(Src) 98.1 F (36.7 C) (Oral)  Resp 16  Ht 5\' 5"  (1.651 m)  Wt 202 lb (91.627 kg)  BMI 33.61 kg/m2  SpO2 100% No data found.   Physical Exam  Constitutional: She is oriented to person, place, and time. She appears well-developed and well-nourished. No distress.  HENT:  Head: Normocephalic.  Mouth/Throat: Oropharynx is clear and moist.  Eyes: Conjunctivae are normal. Pupils are equal, round, and reactive to light.  Musculoskeletal:       Arms: The distal aspect of patient's right upper arm AV fistula extends almost to her antecubital fossa.  There is no erythema, warmth, or tenderness to palpation.  However, the portion of the fistula that has extended has a significantly  louder venous hum than the proximal portion.  Neurological: She is alert and oriented to person, place, and time.  Skin: Skin is warm and dry. No rash noted.  Nursing note and vitals reviewed.   ED Course  Procedures none   MDM   1. Dialysis AV fistula malfunction, initial encounter [facility])    Followup with vascular surgeon for further evaluation    Kandra Nicolas, MD 01/07/16  1501 

## 2016-01-07 NOTE — Telephone Encounter (Signed)
rec'd call from nurse at Urgent Care re: pt. being at their office.  Reported the pt. has had "2 sites above the right antecubital space, that have been bulbous."  Stated that pt. Reported a change in the past 24 hrs., with the bulbous areas extending down below the antecubital space.  Stated the 2 areas are approx. 1" diameter, and raised up approx. 1 ".   Pt. denies any tenderness.  Reported afebrile.  Described the areas as being white, and mottled in appearance.  Asking if pt. Can be seen today at [facility].  Discussed with Dr. Oneida Alar.  Recommended to add her on to schedule this afternoon; no vascular study is needed at this time.  Notified Mardene Celeste at [facility]gent Care of plan to see pt. Today.  Appt. Given for 3:15 PM.

## 2016-01-07 NOTE — Discharge Instructions (Signed)
Dialysis Vascular Access Malfunction °A vascular access is an entrance to your blood vessels that can be used for dialysis. A vascular access can be made in one of several ways:  °· Joining an artery to a vein under your skin to make a bigger blood vessel called a fistula.   °· Joining an artery to a vein under your skin using a soft tube called a graft.   °· Placing a thin, flexible tube (catheter) in a large vein, usually in your neck.   °A vascular access may malfunction or become blocked.  °WHAT CAN CAUSE YOUR VASCULAR ACCESS TO MALFUNCTION? °· Infection (common).   °· A blood clot inside a part of the fistula, graft, or catheter. A blood clot can completely or partially block the flow of blood.   °· A kink in the graft or catheter.   °· A collection of blood (called a hematoma or bruise) next to the graft or catheter that pushes against it, blocking the flow of blood.   °WHAT ARE SIGNS AND SYMPTOMS OF VASCULAR ACCESS MALFUNCTION? °· There is a change in the vibration or pulse of your fistula or graft. °· The vibration or pulse of your fistula or graft is gone.   °· There is new or unusual swelling of the area around the access.   °· There was an unsuccessful puncture of your access by the dialysis team.   °· The flow of blood through the fistula, graft, or catheter is too slow for effective dialysis.   °· When routine dialysis is completed and the needle is removed, bleeding lasts for too long a time.   °WHAT HAPPENS IF MY VASCULAR ACCESS MALFUNCTIONS? °Your health care provider may order blood work, cultures, or an X-ray test in order to learn what may be wrong with your vascular access. The X-ray test involves the injection of a liquid into the vascular access. The liquid shows up on the X-ray and allows your health care provider to see if there is a blockage in the vascular access.  °Treatment varies depending on the cause of the malfunction:   °· If the vascular access is infected, your health care provider  may prescribe antibiotic medicine to control the infection.   °· If a clot is found in the vascular access, you may need surgery to remove the clot.   °· If a blockage in the vascular access is due to some other cause (such as a kink in a graft), then you will likely need surgery to unblock or replace the graft.   °HOME CARE INSTRUCTIONS: °Follow up with your surgeon or other health care provider if you were instructed to do so. This is very important. Any delay in follow-up could cause permanent dysfunction of the vascular access, which may be dangerous.  °SEEK MEDICAL CARE IF:  °· Fever develops.   °· Swelling and pain around the vascular access gets worse or new pain develops. °· Pain, numbness, or an unusual pale skin color develops in the hand on the side of your vascular access. °SEEK IMMEDIATE MEDICAL CARE IF: °Unusual bleeding develops at the location of the vascular access. °MAKE SURE YOU: °· Understand these instructions. °· Will watch your condition. °· Will get help right away if you are not doing well or get worse. °  °This information is not intended to replace advice given to you by your health care provider. Make sure you discuss any questions you have with your health care provider. °  °Document Released: 06/27/2006 Document Revised: 08/15/2014 Document Reviewed: 12/27/2012 °Elsevier Interactive Patient Education ©2016 [employer] ° °

## 2016-01-07 NOTE — Telephone Encounter (Signed)
Attempted to call pt; left voice message to call back to speak to the Triage nurse.

## 2016-01-08 DIAGNOSIS — Z940 Kidney transplant status: Secondary | ICD-10-CM | POA: Diagnosis not present

## 2016-01-08 DIAGNOSIS — R799 Abnormal finding of blood chemistry, unspecified: Secondary | ICD-10-CM | POA: Diagnosis not present

## 2016-01-08 DIAGNOSIS — B259 Cytomegaloviral disease, unspecified: Secondary | ICD-10-CM | POA: Diagnosis not present

## 2016-01-08 DIAGNOSIS — Z79899 Other long term (current) drug therapy: Secondary | ICD-10-CM | POA: Diagnosis not present

## 2016-01-12 DIAGNOSIS — Z79899 Other long term (current) drug therapy: Secondary | ICD-10-CM | POA: Diagnosis not present

## 2016-01-12 DIAGNOSIS — Z940 Kidney transplant status: Secondary | ICD-10-CM | POA: Diagnosis not present

## 2016-01-12 DIAGNOSIS — R799 Abnormal finding of blood chemistry, unspecified: Secondary | ICD-10-CM | POA: Diagnosis not present

## 2016-01-12 DIAGNOSIS — B259 Cytomegaloviral disease, unspecified: Secondary | ICD-10-CM | POA: Diagnosis not present

## 2016-01-14 DIAGNOSIS — B259 Cytomegaloviral disease, unspecified: Secondary | ICD-10-CM | POA: Diagnosis not present

## 2016-01-18 ENCOUNTER — Encounter: Payer: Self-pay | Admitting: Family Medicine

## 2016-01-19 DIAGNOSIS — Z940 Kidney transplant status: Secondary | ICD-10-CM | POA: Diagnosis not present

## 2016-01-19 DIAGNOSIS — R799 Abnormal finding of blood chemistry, unspecified: Secondary | ICD-10-CM | POA: Diagnosis not present

## 2016-01-19 DIAGNOSIS — B259 Cytomegaloviral disease, unspecified: Secondary | ICD-10-CM | POA: Diagnosis not present

## 2016-01-19 DIAGNOSIS — Z79899 Other long term (current) drug therapy: Secondary | ICD-10-CM | POA: Diagnosis not present

## 2016-01-22 DIAGNOSIS — L438 Other lichen planus: Secondary | ICD-10-CM | POA: Diagnosis not present

## 2016-01-22 DIAGNOSIS — Z949 Transplanted organ and tissue status, unspecified: Secondary | ICD-10-CM | POA: Diagnosis not present

## 2016-01-25 DIAGNOSIS — Z940 Kidney transplant status: Secondary | ICD-10-CM | POA: Diagnosis not present

## 2016-01-25 DIAGNOSIS — Z79899 Other long term (current) drug therapy: Secondary | ICD-10-CM | POA: Diagnosis not present

## 2016-01-25 DIAGNOSIS — R799 Abnormal finding of blood chemistry, unspecified: Secondary | ICD-10-CM | POA: Diagnosis not present

## 2016-01-25 DIAGNOSIS — B259 Cytomegaloviral disease, unspecified: Secondary | ICD-10-CM | POA: Diagnosis not present

## 2016-01-28 ENCOUNTER — Ambulatory Visit (INDEPENDENT_AMBULATORY_CARE_PROVIDER_SITE_OTHER): Payer: Medicare Other | Admitting: Family Medicine

## 2016-01-28 ENCOUNTER — Encounter: Payer: Self-pay | Admitting: Family Medicine

## 2016-01-28 VITALS — BP 129/86 | HR 76 | Temp 97.8°F | Resp 16 | Ht 65.0 in | Wt 198.8 lb

## 2016-01-28 DIAGNOSIS — E039 Hypothyroidism, unspecified: Secondary | ICD-10-CM

## 2016-01-28 DIAGNOSIS — I10 Essential (primary) hypertension: Secondary | ICD-10-CM | POA: Diagnosis not present

## 2016-01-28 DIAGNOSIS — Z940 Kidney transplant status: Secondary | ICD-10-CM

## 2016-01-28 DIAGNOSIS — N183 Chronic kidney disease, stage 3 unspecified: Secondary | ICD-10-CM

## 2016-01-28 LAB — TSH: TSH: 0.05 u[IU]/mL — ABNORMAL LOW (ref 0.35–4.50)

## 2016-01-28 NOTE — Progress Notes (Signed)
OFFICE VISIT  01/28/2016   CC:  Chief Complaint  Patient presents with  . Follow-up    Thyroid, pt is not fasting.   [name] URI    x 3 weeks   HPI:    Patient is a 70 y.o. Caucasian female who presents for 3 mo f/u hypothyroidism. See PMH below for details: she got kidney transplant November 12, 2015 and has been followed by [facility] for post-renal transplant care.  Most recent note I have is from 12/17/15, at which time there was worry about her having CMV, but otherwise things were going well. Most recent f/u was 2 days ago: prograf dose changed today in response to labs but no other lab data known yet. ID clinic: CMV had apparently come down a lot.  The last 4-6 mo has been problematic for her regarding compliance with her levothyroxine, so she was working on taking that med more regularly and we planned on repeat TSH today.  Now says she has been taking this med with 90-95% compliance for the last 3 mo or so.  Last few weeks has had nasal congestion, sinus pressure, HA across forehead.  A little cough.  No fever. No ST.  No SOB.  The last week things have improved some.  Home bp monitoring: says avg <140/90.   Past Medical History  Diagnosis Date  . Hypertension   . Hypothyroidism   . Gout   . Arthritis   . Anemia of chronic kidney failure     r/t kidney function- receives Procrit  . FSGS (focal segmental glomerulosclerosis)     right kidney; hx of left renal cell cancer and got nephrectomy 1993.  . Osteopenia   . Diverticulosis     a. 05/2012 colonoscopy  . ESRD (end stage renal disease) ([place])     began dialysis 2014--followed by Dr. [name].  Received deceased donor kidney transplant 10/2015.  [name] SVT (supraventricular tachycardia) ([place])   . GERD (gastroesophageal reflux disease)   . Hiatal hernia   . History of renal cell cancer 1993    Left nephrectomy  . Erosive lichen planus of vulva   . Hyperlipidemia   . Chronic renal insufficiency, stage III (moderate)  10/2015    Post-transplant Cr 1.3, GFR 41 as of June 2017 f/u w/ [facility]    Past Surgical History  Procedure Laterality Date  . Total abdominal hysterectomy w/ bilateral salpingoophorectomy  1997    fibroids  . Nephrectomy  1993    for malignancy- left  . Colonoscopy with polypectomy      Dr Henrene Pastor  . Av fistula placement Right     Hemodyalisis initiated 2014; schedule is M/W/F [place]  . Renal biopsy      right  . Colonoscopy  2003; 05/2012    diverticulosis, no polyps.  Recall 10 yrs  . Tee without cardioversion N/A 08/27/2013    Procedure: TRANSESOPHAGEAL ECHOCARDIOGRAM (TEE);  Surgeon: [name], MD;  Location: [place];  Service: Cardiovascular;  Laterality: N/A;  . Vulva / perineum biopsy  2015  . Kidney transplant  11/12/15    Deceased donor kidney transplant, with Thymoglobulin induction    Outpatient Prescriptions Prior to Visit  Medication Sig Dispense Refill  . acetaminophen (TYLENOL) 500 MG tablet Take 500-750 mg by mouth every 6 (six) hours as needed for pain.    [name] aspirin EC 81 MG EC tablet Take 1 tablet (81 mg total) by mouth daily.    Skipper Cliche SALINE  NASAL DROPS NA Place 2 sprays into both nostrils daily.    . Camphor-Eucalyptus-Menthol (VICKS VAPORUB EX) Apply 1 application topically daily.    . clobetasol ointment (TEMOVATE) [id number] % Apply 1 application topically 2 (two) times daily. Apply to affected area for two weeks then once a day for 2 weeks. 30 g 2  . levothyroxine (SYNTHROID, LEVOTHROID) 175 MCG tablet take 1 tablet by mouth once daily EXCEPT ON WEDNESDAY TAKE 1AND1/2 tablets 32 tablet 0  . loratadine (CLARITIN) 10 MG tablet Take 20 mg by mouth as needed for allergies.    [name] meclizine (ANTIVERT) 25 MG tablet Take 1 tablet (25 mg total) by mouth 3 (three) times daily as needed for dizziness or nausea. 20 tablet 0  . fluticasone (FLONASE) 50 MCG/ACT nasal spray Place 1 spray into the nose 2 (two) times daily as needed for  rhinitis. (Patient not taking: Reported on 01/28/2016) 16 g 2  . lidocaine-prilocaine (EMLA) cream Reported on 01/28/2016  6  . multivitamin (RENA-VIT) TABS tablet take 1 tablet by mouth daily (Patient not taking: Reported on 01/28/2016) 30 tablet 5  . propranolol (INDERAL) 40 MG tablet take 1 tablet by mouth once daily ON TUES, THURS, SAT, AND SUN. AND TWICE DAILY MON., WED, AND FRI. (Patient not taking: Reported on 01/28/2016) 44 tablet 5  . SENSIPAR 30 MG tablet Take 30 mg by mouth daily. Reported on 01/28/2016    . sevelamer carbonate (RENVELA) 800 MG tablet Take 800 mg by mouth 3 (three) times daily with meals. Reported on 01/28/2016    . verapamil (CALAN-SR) 120 MG CR tablet Take 1 tablet (120 mg total) by mouth daily. (Patient not taking: Reported on 01/28/2016) 90 tablet 3   No facility-administered medications prior to visit.    Allergies  Allergen Reactions  . Cefaclor Rash  . Naproxen Rash  . Enalapril Maleate Cough  . Metoprolol Tartrate Rash    On legs    ROS As per HPI  PE: Blood pressure 129/86, pulse 76, temperature 97.8 F (36.6 C), temperature source Oral, resp. rate 16, height 5\' 5"  (1.651 m), weight 198 lb 12 oz (90.152 kg), SpO2 96 %. Gen: Alert, well appearing.  Patient is oriented to person, place, time, and situation. ENT: Ears: EACs clear, normal epithelium.  TMs with good light reflex and landmarks bilaterally.  Eyes: no injection, icteris, swelling, or exudate.  EOMI, PERRLA. Nose: no drainage or turbinate edema/swelling.  No injection or focal lesion.  Mouth: lips without lesion/swelling.  Oral mucosa pink and moist.  Dentition intact and without obvious caries or gingival swelling.  Oropharynx without erythema, exudate, or swelling.  Neck - No masses or thyromegaly or limitation in range of motion CV: soft continuous murmur appreciated: past eval of this with echo showed the likely source of this is her right upper arm AV fistula EXT: no clubbing, cyanosis, or  edema.    LABS:  Lab Results  Component Value Date   TSH 20.10* 07/16/2015    IMPRESSION AND PLAN:  1) Hypothyroidism: hx of noncompliance with meds but last 2-3 mo have been good. Recheck TSH today.  2) HTN: The current medical regimen is effective;  continue present plan and medications.  3) S/P renal transplant, CRI with avg Cr 1.3 since transplant (GFR 50s): ongoing f/u and treatment is with [facility]. Will try to get most recent records/labs.  4) URI, suspect viral source. Appears to be improving the last 1 wk.  Appears well today.  An  After Visit Summary was printed and given to the patient.  FOLLOW UP: Return in about 3 months (around 04/29/2016) for routine chronic illness f/u (30 min).  Signed:  Crissie Sickles, MD           01/28/2016

## 2016-01-28 NOTE — Progress Notes (Signed)
Pre visit review using our clinic review tool, if applicable. No additional management support is needed unless otherwise documented below in the visit note. 

## 2016-02-01 DIAGNOSIS — Z79899 Other long term (current) drug therapy: Secondary | ICD-10-CM | POA: Diagnosis not present

## 2016-02-01 DIAGNOSIS — Z940 Kidney transplant status: Secondary | ICD-10-CM | POA: Diagnosis not present

## 2016-02-01 DIAGNOSIS — B259 Cytomegaloviral disease, unspecified: Secondary | ICD-10-CM | POA: Diagnosis not present

## 2016-02-01 DIAGNOSIS — R799 Abnormal finding of blood chemistry, unspecified: Secondary | ICD-10-CM | POA: Diagnosis not present

## 2016-02-05 DIAGNOSIS — Z940 Kidney transplant status: Secondary | ICD-10-CM | POA: Diagnosis not present

## 2016-02-05 DIAGNOSIS — R799 Abnormal finding of blood chemistry, unspecified: Secondary | ICD-10-CM | POA: Diagnosis not present

## 2016-02-05 DIAGNOSIS — B259 Cytomegaloviral disease, unspecified: Secondary | ICD-10-CM | POA: Diagnosis not present

## 2016-02-05 DIAGNOSIS — Z79899 Other long term (current) drug therapy: Secondary | ICD-10-CM | POA: Diagnosis not present

## 2016-02-08 DIAGNOSIS — B259 Cytomegaloviral disease, unspecified: Secondary | ICD-10-CM | POA: Diagnosis not present

## 2016-02-08 DIAGNOSIS — Z79899 Other long term (current) drug therapy: Secondary | ICD-10-CM | POA: Diagnosis not present

## 2016-02-08 DIAGNOSIS — Z940 Kidney transplant status: Secondary | ICD-10-CM | POA: Diagnosis not present

## 2016-02-11 ENCOUNTER — Other Ambulatory Visit: Payer: Self-pay | Admitting: Family Medicine

## 2016-02-11 ENCOUNTER — Other Ambulatory Visit: Payer: Self-pay | Admitting: *Deleted

## 2016-02-16 DIAGNOSIS — Z940 Kidney transplant status: Secondary | ICD-10-CM | POA: Diagnosis not present

## 2016-02-16 DIAGNOSIS — Z79899 Other long term (current) drug therapy: Secondary | ICD-10-CM | POA: Diagnosis not present

## 2016-02-16 DIAGNOSIS — B259 Cytomegaloviral disease, unspecified: Secondary | ICD-10-CM | POA: Diagnosis not present

## 2016-02-16 DIAGNOSIS — R799 Abnormal finding of blood chemistry, unspecified: Secondary | ICD-10-CM | POA: Diagnosis not present

## 2016-02-22 DIAGNOSIS — R799 Abnormal finding of blood chemistry, unspecified: Secondary | ICD-10-CM | POA: Diagnosis not present

## 2016-02-22 DIAGNOSIS — Z79899 Other long term (current) drug therapy: Secondary | ICD-10-CM | POA: Diagnosis not present

## 2016-02-22 DIAGNOSIS — N186 End stage renal disease: Secondary | ICD-10-CM | POA: Diagnosis not present

## 2016-02-22 DIAGNOSIS — Z940 Kidney transplant status: Secondary | ICD-10-CM | POA: Diagnosis not present

## 2016-02-22 DIAGNOSIS — B259 Cytomegaloviral disease, unspecified: Secondary | ICD-10-CM | POA: Diagnosis not present

## 2016-02-29 DIAGNOSIS — R799 Abnormal finding of blood chemistry, unspecified: Secondary | ICD-10-CM | POA: Diagnosis not present

## 2016-02-29 DIAGNOSIS — Z79899 Other long term (current) drug therapy: Secondary | ICD-10-CM | POA: Diagnosis not present

## 2016-02-29 DIAGNOSIS — Z940 Kidney transplant status: Secondary | ICD-10-CM | POA: Diagnosis not present

## 2016-02-29 DIAGNOSIS — B259 Cytomegaloviral disease, unspecified: Secondary | ICD-10-CM | POA: Diagnosis not present

## 2016-03-07 DIAGNOSIS — B259 Cytomegaloviral disease, unspecified: Secondary | ICD-10-CM | POA: Diagnosis not present

## 2016-03-07 DIAGNOSIS — Z79899 Other long term (current) drug therapy: Secondary | ICD-10-CM | POA: Diagnosis not present

## 2016-03-07 DIAGNOSIS — R799 Abnormal finding of blood chemistry, unspecified: Secondary | ICD-10-CM | POA: Diagnosis not present

## 2016-03-07 DIAGNOSIS — Z940 Kidney transplant status: Secondary | ICD-10-CM | POA: Diagnosis not present

## 2016-03-14 DIAGNOSIS — B259 Cytomegaloviral disease, unspecified: Secondary | ICD-10-CM | POA: Diagnosis not present

## 2016-03-14 DIAGNOSIS — R799 Abnormal finding of blood chemistry, unspecified: Secondary | ICD-10-CM | POA: Diagnosis not present

## 2016-03-14 DIAGNOSIS — Z79899 Other long term (current) drug therapy: Secondary | ICD-10-CM | POA: Diagnosis not present

## 2016-03-14 DIAGNOSIS — Z940 Kidney transplant status: Secondary | ICD-10-CM | POA: Diagnosis not present

## 2016-03-22 DIAGNOSIS — Z79899 Other long term (current) drug therapy: Secondary | ICD-10-CM | POA: Diagnosis not present

## 2016-03-22 DIAGNOSIS — Z940 Kidney transplant status: Secondary | ICD-10-CM | POA: Diagnosis not present

## 2016-03-22 DIAGNOSIS — B9789 Other viral agents as the cause of diseases classified elsewhere: Secondary | ICD-10-CM | POA: Diagnosis not present

## 2016-03-25 DIAGNOSIS — Z79899 Other long term (current) drug therapy: Secondary | ICD-10-CM | POA: Diagnosis not present

## 2016-03-25 DIAGNOSIS — R799 Abnormal finding of blood chemistry, unspecified: Secondary | ICD-10-CM | POA: Diagnosis not present

## 2016-03-25 DIAGNOSIS — Z940 Kidney transplant status: Secondary | ICD-10-CM | POA: Diagnosis not present

## 2016-03-25 DIAGNOSIS — B259 Cytomegaloviral disease, unspecified: Secondary | ICD-10-CM | POA: Diagnosis not present

## 2016-03-30 ENCOUNTER — Ambulatory Visit: Payer: Medicare Other | Admitting: Family Medicine

## 2016-04-04 DIAGNOSIS — B259 Cytomegaloviral disease, unspecified: Secondary | ICD-10-CM | POA: Diagnosis not present

## 2016-04-04 DIAGNOSIS — Z79899 Other long term (current) drug therapy: Secondary | ICD-10-CM | POA: Diagnosis not present

## 2016-04-04 DIAGNOSIS — Z940 Kidney transplant status: Secondary | ICD-10-CM | POA: Diagnosis not present

## 2016-04-04 DIAGNOSIS — R799 Abnormal finding of blood chemistry, unspecified: Secondary | ICD-10-CM | POA: Diagnosis not present

## 2016-04-08 DIAGNOSIS — M7542 Impingement syndrome of left shoulder: Secondary | ICD-10-CM

## 2016-04-08 HISTORY — DX: Impingement syndrome of left shoulder: M75.42

## 2016-04-12 DIAGNOSIS — Z79899 Other long term (current) drug therapy: Secondary | ICD-10-CM | POA: Diagnosis not present

## 2016-04-12 DIAGNOSIS — R799 Abnormal finding of blood chemistry, unspecified: Secondary | ICD-10-CM | POA: Diagnosis not present

## 2016-04-12 DIAGNOSIS — B259 Cytomegaloviral disease, unspecified: Secondary | ICD-10-CM | POA: Diagnosis not present

## 2016-04-12 DIAGNOSIS — Z940 Kidney transplant status: Secondary | ICD-10-CM | POA: Diagnosis not present

## 2016-04-18 ENCOUNTER — Ambulatory Visit (INDEPENDENT_AMBULATORY_CARE_PROVIDER_SITE_OTHER): Payer: Medicare Other | Admitting: Family Medicine

## 2016-04-18 ENCOUNTER — Encounter: Payer: Self-pay | Admitting: Family Medicine

## 2016-04-18 VITALS — BP 126/80 | HR 69 | Temp 98.1°F | Resp 16 | Ht 65.5 in | Wt 202.8 lb

## 2016-04-18 DIAGNOSIS — Z0000 Encounter for general adult medical examination without abnormal findings: Secondary | ICD-10-CM | POA: Diagnosis not present

## 2016-04-18 DIAGNOSIS — Z7289 Other problems related to lifestyle: Secondary | ICD-10-CM | POA: Diagnosis not present

## 2016-04-18 DIAGNOSIS — E2839 Other primary ovarian failure: Secondary | ICD-10-CM

## 2016-04-18 DIAGNOSIS — Z1159 Encounter for screening for other viral diseases: Secondary | ICD-10-CM | POA: Diagnosis not present

## 2016-04-18 DIAGNOSIS — E039 Hypothyroidism, unspecified: Secondary | ICD-10-CM | POA: Diagnosis not present

## 2016-04-18 LAB — TSH: TSH: 0.04 u[IU]/mL — ABNORMAL LOW (ref 0.35–4.50)

## 2016-04-18 NOTE — Patient Instructions (Signed)
Ask [facility] if you have had Prevnar 13 vaccine.  If so, ask the date.--thx

## 2016-04-18 NOTE — Progress Notes (Signed)
The patient is here for annual Medicare wellness examination and management of other chronic and acute problems. Other problems discussed today: pt with hypothyroidism, last TSH showed oversuppression so her does was decreased and she is due for TSH recheck.  Lab Results  Component Value Date   TSH 0.05 (L) 01/28/2016   Lab Results  Component Value Date   CHOL 172 07/16/2015   Lab Results  Component Value Date   HDL 46.60 07/16/2015   Lab Results  Component Value Date   LDLCALC 98 07/16/2015   Lab Results  Component Value Date   TRIG 135.0 07/16/2015   Lab Results  Component Value Date   CHOLHDL 4 07/16/2015   Lab Results  Component Value Date   HGBA1C 5.4 02/23/2012     AWV DATA The risk factors are reflected in the social history.  The roster of all physicians providing medical care to patient is listed in the Snapshot section of the chart.  Activities of daily living:  The patient is 100% independent in all ADLs: dressing, toileting, feeding as well as independent mobility.  Home safety : The patient has smoke detectors in the home. They wear seatbelts. No firearms at home ( firearms are present in the home, kept in a safe fashion). There is no violence in the home.   There is no risks for hepatitis, STDs or HIV. There is no history of blood transfusion. They have no travel history to infectious disease endemic areas of the world.  The patient has seen their dentist in the last six month. They have not seen their eye doctor in the last year. They deny any hearing difficulty and have not had audiologic testing in the last year.  They do not  have excessive sun exposure. Discussed the need for sun protection: hats, long sleeves and use of sunscreen if there is significant sun exposure.   Diet: the importance of a healthy diet is discussed. They do have a healthy moderately healthy diet.  The patient does not have a regular exercise program.  The benefits of regular  aerobic exercise were discussed.  Depression screen: there are no signs or vegative symptoms of depression- irritability, change in appetite, anhedonia, sadness/tearfullness. Depression screen PHQ 2/9 04/18/2016  Decreased Interest 0  Down, Depressed, Hopeless 1  PHQ - 2 Score 1    Fall Risk  04/18/2016 11/05/2015 07/23/2013  Falls in the past year? No Yes Yes  Number falls in past yr: - 1 1  Injury with Fall? - No No   Cognitive assessment: the patient manages all their financial and personal affairs and is actively engaged. They could relate day,date,year and events; recalled 3/3 objects at 3 minutes; performed clock-face test normally.  Reviewed advance directives with pt today.  The following portions of the patient's history were reviewed and updated as appropriate: allergies, current medications, past family history, past medical history,  past surgical history, past social history  and problem list.  Vision, hearing, body mass index were assessed and reviewed.  Body mass index is 33.23 kg/m.  During the course of the visit the patient was educated and counseled about appropriate screening and preventive services including :  Annual wellness visit : currently doing. diabetes screening: pt currently gets this via routine lab draws for other conditions. colorectal cancer screening: pt UTD on colorectal cancer screening, next colonoscopy due 2023. recommended immunizations (influenza, pneumococcal, Hep B): pt will check with [facility] to see if prevnar has been given.  She'll  defer flu until later date. Bone mass measurement: will order for [facility]. Counseling to prevent tobacco use: n/a Depression screening: done today. Glaucoma screening: Pt to make appt with eye MD. Hepatitis C virus screening: will do today. HIV virus screening: pt declines. Lung cancer screening: doesn't qualify. Medical nutrition therapy: n/a Prostate cancer screening: n/a Screening  mammography: pt will schedule on/after 06/03/16.at med center [place]. Screening pap tests, pelvic exam, and clinical breast exam: Pt has GYN, but is also not a candidate for this screening due to having had TAH w/ BSO for nonmalignant reasons. Ultrasound screening for AAA: Pt doesn't qualify.  A written plan of action regarding the above screening and preventative services was given to the patient today.  An After Visit Summary was printed and given to the patient.  Signed:  Crissie Sickles, MD           04/18/2016

## 2016-04-19 ENCOUNTER — Other Ambulatory Visit: Payer: Self-pay | Admitting: Family Medicine

## 2016-04-19 DIAGNOSIS — B9789 Other viral agents as the cause of diseases classified elsewhere: Secondary | ICD-10-CM | POA: Diagnosis not present

## 2016-04-19 DIAGNOSIS — E039 Hypothyroidism, unspecified: Secondary | ICD-10-CM

## 2016-04-19 DIAGNOSIS — Z940 Kidney transplant status: Secondary | ICD-10-CM | POA: Diagnosis not present

## 2016-04-19 DIAGNOSIS — E785 Hyperlipidemia, unspecified: Secondary | ICD-10-CM | POA: Diagnosis not present

## 2016-04-19 DIAGNOSIS — I10 Essential (primary) hypertension: Secondary | ICD-10-CM | POA: Diagnosis not present

## 2016-04-19 DIAGNOSIS — Z1231 Encounter for screening mammogram for malignant neoplasm of breast: Secondary | ICD-10-CM

## 2016-04-19 DIAGNOSIS — Z79899 Other long term (current) drug therapy: Secondary | ICD-10-CM | POA: Diagnosis not present

## 2016-04-19 LAB — HEPATITIS C ANTIBODY: HCV AB: NEGATIVE

## 2016-04-19 MED ORDER — LEVOTHYROXINE SODIUM 125 MCG PO TABS: 125.0000 ug | ORAL_TABLET | Freq: Every day | ORAL | 3 refills | Status: DC

## 2016-04-25 DIAGNOSIS — R799 Abnormal finding of blood chemistry, unspecified: Secondary | ICD-10-CM | POA: Diagnosis not present

## 2016-04-25 DIAGNOSIS — B259 Cytomegaloviral disease, unspecified: Secondary | ICD-10-CM | POA: Diagnosis not present

## 2016-04-25 DIAGNOSIS — Z79899 Other long term (current) drug therapy: Secondary | ICD-10-CM | POA: Diagnosis not present

## 2016-04-25 DIAGNOSIS — Z940 Kidney transplant status: Secondary | ICD-10-CM | POA: Diagnosis not present

## 2016-04-26 ENCOUNTER — Encounter: Payer: Self-pay | Admitting: Family Medicine

## 2016-04-26 ENCOUNTER — Ambulatory Visit (INDEPENDENT_AMBULATORY_CARE_PROVIDER_SITE_OTHER): Payer: Medicare Other | Admitting: Family Medicine

## 2016-04-26 VITALS — BP 149/83 | HR 68 | Temp 97.8°F | Resp 18 | Wt 203.1 lb

## 2016-04-26 DIAGNOSIS — M1990 Unspecified osteoarthritis, unspecified site: Secondary | ICD-10-CM | POA: Diagnosis not present

## 2016-04-26 DIAGNOSIS — M7582 Other shoulder lesions, left shoulder: Secondary | ICD-10-CM

## 2016-04-26 DIAGNOSIS — M19012 Primary osteoarthritis, left shoulder: Secondary | ICD-10-CM

## 2016-04-26 DIAGNOSIS — M25512 Pain in left shoulder: Secondary | ICD-10-CM | POA: Diagnosis not present

## 2016-04-26 NOTE — Progress Notes (Signed)
Pre visit review using our clinic review tool, if applicable. No additional management support is needed unless otherwise documented below in the visit note. 

## 2016-04-26 NOTE — Progress Notes (Signed)
OFFICE VISIT  04/26/2016   CC:  Chief Complaint  Patient presents with  . Shoulder Pain    Left   HPI:    Patient is a 70 y.o. Caucasian female who presents for left shoulder pain. Onset about 6 mo ago, soreness over back of shoulder and top of shoulder, also AC joint area.  Constant ache, hard to sleep at night.  Pain nagging, not severe. No neck pain.  No arm paresthesias.  Tylenol helps when taken prn.  Occ applies heat and this helps a little. No hx of shoulder trauma/strain.  Very remote hx of getting a steroid injection in L shoulder.  Past Medical History:  Diagnosis Date  . Anemia of chronic kidney failure    r/t kidney function- receives Procrit  . Arthritis   . Chronic renal insufficiency, stage III (moderate) 10/2015   Post-transplant Cr 1.3, GFR 41 as of June 2017 f/u w/ [facility]  . CMV infection [facility]) summer 2017   Valcyte per ID/Renal transplant team  . Diverticulosis    a. 05/2012 colonoscopy  . Erosive lichen planus of vulva   . ESRD (end stage renal disease) ([place])    began dialysis 2014--followed by Dr. [name].  Received deceased donor kidney transplant 10/2015.  [name] FSGS (focal segmental glomerulosclerosis)    right kidney; hx of left renal cell cancer and got nephrectomy 1993.  [name] GERD (gastroesophageal reflux disease)   . Gout   . Heart murmur    (Diastolic) ECHO 01/5680 showed that this murmur is coming from pt's R arm AV fistula  . Hiatal hernia   . History of renal cell cancer 1993   Left nephrectomy  . Hyperlipidemia   . Hypertension   . Hypothyroidism   . Osteopenia   . Renal transplant recipient 11/13/15  . SVT (supraventricular tachycardia) ([facility])     Past Surgical History:  Procedure Laterality Date  . AV FISTULA PLACEMENT Right    Right arm: aneurismal dilatation 2017 being followed by CV surgeons  . COLONOSCOPY  2003; 05/2012   diverticulosis, no polyps.  Recall 10 yrs  . colonoscopy with polypectomy     Dr Henrene Pastor  .  KIDNEY TRANSPLANT  2015-11-13   Deceased donor kidney transplant, with Thymoglobulin induction  . NEPHRECTOMY  1993   for malignancy- left  . RENAL BIOPSY     right  . TEE WITHOUT CARDIOVERSION N/A 08/27/2013   Procedure: TRANSESOPHAGEAL ECHOCARDIOGRAM (TEE);  Surgeon: [name], MD;  Location: [facility];  Service: Cardiovascular;  Laterality: N/A;  . TOTAL ABDOMINAL HYSTERECTOMY W/ BILATERAL SALPINGOOPHORECTOMY  1997   fibroids  . VULVA / [place] BIOPSY  2015    Outpatient Medications Prior to Visit  Medication Sig Dispense Refill  . acetaminophen (TYLENOL) 500 MG tablet Take 500-750 mg by mouth every 6 (six) hours as needed for pain.    [name] aspirin EC 81 MG EC tablet Take 1 tablet (81 mg total) by mouth daily.    Skipper Cliche SALINE NASAL DROPS NA Place 2 sprays into both nostrils daily.    . Camphor-Eucalyptus-Menthol (VICKS VAPORUB EX) Apply 1 application topically daily.    . clobetasol ointment (TEMOVATE) 2.75 % Apply 1 application topically 2 (two) times daily. Apply to affected area for two weeks then once a day for 2 weeks. 30 g 2  . docusate sodium (COLACE) 100 MG capsule Take 1 capsule by mouth 2 (two) times daily as needed.  11  . famotidine (PEPCID) 20 MG tablet  Take 1 tablet by mouth 2 (two) times daily.  11  . levothyroxine (SYNTHROID, LEVOTHROID) 125 MCG tablet Take 1 tablet (125 mcg total) by mouth daily. 30 tablet 3  . loratadine (CLARITIN) 10 MG tablet Take 20 mg by mouth as needed for allergies.    [name] meclizine (ANTIVERT) 25 MG tablet Take 1 tablet (25 mg total) by mouth 3 (three) times daily as needed for dizziness or nausea. 20 tablet 0  . mycophenolate (MYFORTIC) 360 MG TBEC EC tablet Take 360 mg by mouth 2 (two) times daily.    [name] sulfamethoxazole-trimethoprim (BACTRIM,SEPTRA) 400-80 MG tablet Take 1 tablet by mouth daily.    . tacrolimus (PROGRAF) 1 MG capsule Take 1 mg by mouth. 4 tablets in the am and 3 tablet in the pm    . triamcinolone (NASACORT ALLERGY 24HR) 55  MCG/ACT AERO nasal inhaler Place 2 sprays into the nose daily.    . verapamil (CALAN-SR) 240 MG CR tablet Take 1 tablet by mouth daily.    . Vitamin D, Ergocalciferol, (DRISDOL) 50000 units CAPS capsule Take 50,000 Units by mouth every 7 (seven) days.    . valGANciclovir (VALCYTE) 450 MG tablet Take 2 tablets by mouth 2 (two) times daily.     No facility-administered medications prior to visit.     Allergies  Allergen Reactions  . Cefaclor Rash  . Naproxen Rash  . Enalapril Maleate Cough  . Metoprolol Tartrate Rash    On legs    ROS As per HPI  PE: Blood pressure (!) 149/83, pulse 68, temperature 97.8 F (36.6 C), temperature source Oral, resp. rate 18, weight 203 lb 1.9 oz (92.1 kg), SpO2 98 %. Gen: Alert, well appearing.  Patient is oriented to person, place, time, and situation. AFFECT: pleasant, lucid thought and speech. L shoulder: ROM intact but aBduction painful beginning at 30 deg, gets more intense as she further aBducts. Neg drop sign. Mid pain in L shoulder with resisted ER/IR. Flexion and extension are ok at shoulder. +Moderate impingement pain on L.   Mild TTP over AC joint on L but otherwise shoulder is nontender to palpation.   UE strength 5/5 prox/dist bilat.   LABS:  None today  IMPRESSION AND PLAN:  Left shoulder pain, suspect combination of RC tendonitis/impingement + AC joint arthritis. Discussed options: steroid injection AC area today, PT referral, or both. She chose to defer steroid injection today, wants to try PT. PT referral ordered [facility]).  An After Visit Summary was printed and given to the patient.  FOLLOW UP: Return in about 3 months (around 07/26/2016) for routine chronic illness f/u (30 min).  Signed:  Crissie Sickles, MD           04/26/2016

## 2016-04-28 ENCOUNTER — Ambulatory Visit (INDEPENDENT_AMBULATORY_CARE_PROVIDER_SITE_OTHER): Payer: Medicare Other | Admitting: Family Medicine

## 2016-04-28 ENCOUNTER — Encounter: Payer: Self-pay | Admitting: Family Medicine

## 2016-04-28 VITALS — BP 143/82 | HR 67 | Ht 65.0 in | Wt 203.0 lb

## 2016-04-28 DIAGNOSIS — M75102 Unspecified rotator cuff tear or rupture of left shoulder, not specified as traumatic: Secondary | ICD-10-CM | POA: Diagnosis not present

## 2016-04-28 DIAGNOSIS — M25512 Pain in left shoulder: Secondary | ICD-10-CM

## 2016-04-28 DIAGNOSIS — M7542 Impingement syndrome of left shoulder: Secondary | ICD-10-CM

## 2016-04-28 NOTE — Patient Instructions (Signed)
You have rotator cuff impingement Try to avoid painful activities (overhead activities, lifting with extended arm) as much as possible. Tylenol 500mg  1-2 tabs three times a day as needed for pain. Topical capsaicin, aspercreme, biofreeze up to 4 times a day may be helpful. Start physical therapy with transition to home exercise program. Do home exercise program with theraband and scapular stabilization exercises daily - these are very important for long term relief even if an injection was given. Consider subacromial injection, nitro patches if not improving as expected. Follow up with me in 6 weeks.

## 2016-04-29 DIAGNOSIS — Z940 Kidney transplant status: Secondary | ICD-10-CM | POA: Diagnosis not present

## 2016-04-29 DIAGNOSIS — L438 Other lichen planus: Secondary | ICD-10-CM | POA: Diagnosis not present

## 2016-04-29 DIAGNOSIS — R208 Other disturbances of skin sensation: Secondary | ICD-10-CM | POA: Diagnosis not present

## 2016-05-02 DIAGNOSIS — B259 Cytomegaloviral disease, unspecified: Secondary | ICD-10-CM | POA: Diagnosis not present

## 2016-05-02 DIAGNOSIS — M25512 Pain in left shoulder: Secondary | ICD-10-CM | POA: Insufficient documentation

## 2016-05-02 DIAGNOSIS — R799 Abnormal finding of blood chemistry, unspecified: Secondary | ICD-10-CM | POA: Diagnosis not present

## 2016-05-02 DIAGNOSIS — Z79899 Other long term (current) drug therapy: Secondary | ICD-10-CM | POA: Diagnosis not present

## 2016-05-02 DIAGNOSIS — Z940 Kidney transplant status: Secondary | ICD-10-CM | POA: Diagnosis not present

## 2016-05-02 NOTE — Assessment & Plan Note (Signed)
majority of symptoms originating from impingement though she does have some AC joint tenderness as well (but negative crossover).  Discussed options - she will start with tylenol, topical medications, physical therapy and home exercises.  We also discussed nitro patches, subacromial injection - she will consider these if not improving.  Follow up in 6 weeks.

## 2016-05-02 NOTE — Progress Notes (Signed)
PCP and consultation requested by: Tabitha Sou, MD  Subjective:   HPI: Patient is a 70 y.o. female here for left shoulder pain.  Patient reports about 6 months ago she started to get pain in anterolateral left shoulder. Describes pain as an ache, up to 5/10 at the most. Worse lying on this shoulder, at nighttime. Is left handed. Tried tylenol, icy hot. No prior injuries to this arm - believes she had an injection to this shoulder a long time ago though. No skin changes, numbness.  Past Medical History:  Diagnosis Date  . Anemia of chronic kidney failure    r/t kidney function- receives Procrit  . Arthritis   . Chronic renal insufficiency, stage III (moderate) 10/2015   Post-transplant Cr 1.3, GFR 41 as of June 2017 f/u w/ [facility]  . CMV infection [facility]) summer 2017   Valcyte per ID/Renal transplant team  . Diverticulosis    a. 05/2012 colonoscopy  . Erosive lichen planus of vulva   . ESRD (end stage renal disease) ([place])    began dialysis 2014--followed by Dr. [name].  Received deceased donor kidney transplant 10/2015.  [name] FSGS (focal segmental glomerulosclerosis)    right kidney; hx of left renal cell cancer and got nephrectomy 1993.  [name] GERD (gastroesophageal reflux disease)   . Gout   . Heart murmur    (Diastolic) ECHO 12/386 showed that this murmur is coming from pt's R arm AV fistula  . Hiatal hernia   . History of renal cell cancer 1993   Left nephrectomy  . Hyperlipidemia   . Hypertension   . Hypothyroidism   . Osteopenia   . Renal transplant recipient 11/06/15  . SVT (supraventricular tachycardia) ([place])     Current Outpatient Prescriptions on File Prior to Visit  Medication Sig Dispense Refill  . acetaminophen (TYLENOL) 500 MG tablet Take 500-750 mg by mouth every 6 (six) hours as needed for pain.    [name] aspirin EC 81 MG EC tablet Take 1 tablet (81 mg total) by mouth daily.    Skipper Cliche SALINE NASAL DROPS NA Place 2 sprays into both nostrils  daily.    . Camphor-Eucalyptus-Menthol (VICKS VAPORUB EX) Apply 1 application topically daily.    . clobetasol ointment (TEMOVATE) 8.28 % Apply 1 application topically 2 (two) times daily. Apply to affected area for two weeks then once a day for 2 weeks. 30 g 2  . docusate sodium (COLACE) 100 MG capsule Take 1 capsule by mouth 2 (two) times daily as needed.  11  . famotidine (PEPCID) 20 MG tablet Take 1 tablet by mouth 2 (two) times daily.  11  . levothyroxine (SYNTHROID, LEVOTHROID) 125 MCG tablet Take 1 tablet (125 mcg total) by mouth daily. 30 tablet 3  . loratadine (CLARITIN) 10 MG tablet Take 20 mg by mouth as needed for allergies.    [name] meclizine (ANTIVERT) 25 MG tablet Take 1 tablet (25 mg total) by mouth 3 (three) times daily as needed for dizziness or nausea. 20 tablet 0  . mycophenolate (MYFORTIC) 360 MG TBEC EC tablet Take 360 mg by mouth 2 (two) times daily.    [name] sulfamethoxazole-trimethoprim (BACTRIM,SEPTRA) 400-80 MG tablet Take 1 tablet by mouth daily.    . tacrolimus (PROGRAF) 1 MG capsule Take 1 mg by mouth. 4 tablets in the am and 3 tablet in the pm    . triamcinolone (NASACORT ALLERGY 24HR) 55 MCG/ACT AERO nasal inhaler Place 2 sprays into the nose daily.    [name]  valGANciclovir (VALCYTE) 450 MG tablet Take 2 tablets by mouth 2 (two) times daily.    . verapamil (CALAN-SR) 240 MG CR tablet Take 1 tablet by mouth daily.    . Vitamin D, Ergocalciferol, (DRISDOL) 50000 units CAPS capsule Take 50,000 Units by mouth every 7 (seven) days.     No current facility-administered medications on file prior to visit.     Past Surgical History:  Procedure Laterality Date  . AV FISTULA PLACEMENT Right    Right arm: aneurismal dilatation 2017 being followed by CV surgeons  . COLONOSCOPY  2003; 05/2012   diverticulosis, no polyps.  Recall 10 yrs  . colonoscopy with polypectomy     Dr Henrene Pastor  . KIDNEY TRANSPLANT  11-23-2015   Deceased donor kidney transplant, with Thymoglobulin induction  .  NEPHRECTOMY  1993   for malignancy- left  . RENAL BIOPSY     right  . TEE WITHOUT CARDIOVERSION N/A 08/27/2013   Procedure: TRANSESOPHAGEAL ECHOCARDIOGRAM (TEE);  Surgeon: [name], MD;  Location: [facility] ENDOSCOPY;  Service: Cardiovascular;  Laterality: N/A;  . TOTAL ABDOMINAL HYSTERECTOMY W/ BILATERAL SALPINGOOPHORECTOMY  1997   fibroids  . VULVA / PERINEUM BIOPSY  2015    Allergies  Allergen Reactions  . Cefaclor Rash  . Naproxen Rash  . Enalapril Maleate Cough  . Metoprolol Tartrate Rash    On legs    Social History   Social History  . Marital status: Married    Spouse name: N/A  . Number of children: 2  . Years of education: N/A   Occupational History  . Retired    Social History Main Topics  . Smoking status: Never Smoker  . Smokeless tobacco: Never Used  . Alcohol use Yes     Comment: rarely  . Drug use: No  . Sexual activity: Not Currently    Birth control/ protection: Surgical   Other Topics Concern  . Not on file   Social History Narrative   Lives in [place] with husband.  Retired.  Previously worked in [employer] @ [employer]   No T/A/Ds.    Family History  Problem Relation Age of Onset  . Deep vein thrombosis Mother     post thyroid surgery  . Hypertension Mother   . Heart attack Father 28    deceased  . Hypertension Father   . Cancer Sister     breast  . Cancer Paternal Aunt     pancreatic  . Diabetes Maternal Grandfather   . Stroke Paternal Grandmother     in 58s  . Colon cancer Neg Hx   . Esophageal cancer Neg Hx   . Stomach cancer Neg Hx   . Rectal cancer Neg Hx     BP (!) 143/82   Pulse 67   Ht 5\' 5"  (1.651 m)   Wt 203 lb (92.1 kg)   BMI 33.78 kg/m   Review of Systems: See HPI above.    Objective:  Physical Exam:  Gen: NAD, comfortable in exam room  Left shoulder: No swelling, ecchymoses.  No gross deformity. Mild TTP AC joint.  No biceps tendon or other tenderness. FROM with painful arc. Positive Hawkins,  negative Neers. Negative Yergasons. Strength 5/5 with empty can and resisted internal/external rotation.  Mild pain empty can. Negative apprehension. Negative crossover adduction NV intact distally.  Right shoulder: FROM without pain.    Assessment & Plan:  1. Left shoulder pain - majority of symptoms originating from impingement though she does have some  AC joint tenderness as well (but negative crossover).  Discussed options - she will start with tylenol, topical medications, physical therapy and home exercises.  We also discussed nitro patches, subacromial injection - she will consider these if not improving.  Follow up in 6 weeks.

## 2016-05-04 ENCOUNTER — Encounter: Payer: Self-pay | Admitting: Family Medicine

## 2016-05-09 DIAGNOSIS — Z940 Kidney transplant status: Secondary | ICD-10-CM | POA: Diagnosis not present

## 2016-05-09 DIAGNOSIS — R799 Abnormal finding of blood chemistry, unspecified: Secondary | ICD-10-CM | POA: Diagnosis not present

## 2016-05-09 DIAGNOSIS — B259 Cytomegaloviral disease, unspecified: Secondary | ICD-10-CM | POA: Diagnosis not present

## 2016-05-09 DIAGNOSIS — Z79899 Other long term (current) drug therapy: Secondary | ICD-10-CM | POA: Diagnosis not present

## 2016-05-11 ENCOUNTER — Encounter: Payer: Self-pay | Admitting: Rehabilitative and Restorative Service Providers"

## 2016-05-11 ENCOUNTER — Ambulatory Visit (INDEPENDENT_AMBULATORY_CARE_PROVIDER_SITE_OTHER): Payer: Medicare Other | Admitting: Rehabilitative and Restorative Service Providers"

## 2016-05-11 DIAGNOSIS — M25512 Pain in left shoulder: Secondary | ICD-10-CM

## 2016-05-11 DIAGNOSIS — R293 Abnormal posture: Secondary | ICD-10-CM

## 2016-05-11 DIAGNOSIS — R29898 Other symptoms and signs involving the musculoskeletal system: Secondary | ICD-10-CM | POA: Diagnosis not present

## 2016-05-11 DIAGNOSIS — G8929 Other chronic pain: Secondary | ICD-10-CM | POA: Diagnosis not present

## 2016-05-11 NOTE — Patient Instructions (Addendum)
Self massage using about a 4 inch rubber ball   Shoulder Blade Squeeze   Can use swim noodle as a cue  Rotate shoulders back, then squeeze shoulder blades down and back. Hold 10 sec Repeat _10___ times. Do __several__ sessions per day.   Stretch Lt arm only as needed due to stent   Scapula Adduction With Pectoralis Stretch: Low - Standing   Shoulders at 45 hands even with shoulders, keeping weight through legs, shift weight forward until you feel pull or stretch through the front of your chest. Hold _30__ seconds. Do _3__ times, _2-4__ times per day.   Scapula Adduction With Pectoralis Stretch: Mid-Range - Standing   Shoulders at 90 elbows even with shoulders, keeping weight through legs, shift weight forward until you feel pull or strength through the front of your chest. Hold __30_ seconds. Do _3__ times, __2-4_ times per day.   Scapula Adduction With Pectoralis Stretch: High - Standing   Shoulders at 120 hands up high on the doorway, keeping weight on feet, shift weight forward until you feel pull or stretch through the front of your chest. Hold _30__ seconds. Do _3__ times, _2-3__ times per day.

## 2016-05-11 NOTE — Therapy (Signed)
[place]itha [place], [place], [address] Phone: [phone]   Fax:  [phone]  Physical Therapy Evaluation  Patient Details  Name: Tabitha Green MRN: [id number] Date of Birth: [birth year] Referring Provider: Dr Anitra Lauth   Encounter Date: 05/11/2016      PT End of Session - 05/11/16 0848    Visit Number 1   Number of Visits 12   Date for PT Re-Evaluation 06/22/16   PT Start Time 0804   PT Stop Time 0856   PT Time Calculation (min) 52 min   Activity Tolerance Patient tolerated treatment well      Past Medical History:  Diagnosis Date  . Anemia of chronic kidney failure    r/t kidney function- receives Procrit  . Arthritis   . Chronic renal insufficiency, stage III (moderate) 10/2015   Post-transplant Cr 1.3, GFR 41 as of June 2017 f/u w/ [facility]  . CMV infection [facility]) summer 2017   Valcyte per ID/Renal transplant team  . Diverticulosis    a. 05/2012 colonoscopy  . Erosive lichen planus of vulva   . ESRD (end stage renal disease) ([place])    began dialysis 2014--followed by Dr. [name].  Received deceased donor kidney transplant 10/2015.  [name] FSGS (focal segmental glomerulosclerosis)    right kidney; hx of left renal cell cancer and got nephrectomy 1993.  [name] GERD (gastroesophageal reflux disease)   . Gout   . Heart murmur    (Diastolic) ECHO 11/2704 showed that this murmur is coming from pt's R arm AV fistula  . Hiatal hernia   . History of renal cell cancer 1993   Left nephrectomy  . Hyperlipidemia   . Hypertension   . Hypothyroidism   . Impingement syndrome of left shoulder 04/2016   Dr. Barbaraann Barthel  . Osteopenia   . Renal transplant recipient 11/16/2015  . SVT (supraventricular tachycardia) ([facility])     Past Surgical History:  Procedure Laterality Date  . AV FISTULA PLACEMENT Right    Right arm: aneurismal dilatation 2017 being followed by CV surgeons  . COLONOSCOPY  2003; 05/2012   diverticulosis, no polyps.  Recall 10 yrs  . colonoscopy with polypectomy     Dr Henrene Pastor  . KIDNEY TRANSPLANT  11-16-2015   Deceased donor kidney transplant, with Thymoglobulin induction  . NEPHRECTOMY  1993   for malignancy- left  . RENAL BIOPSY     right  . TEE WITHOUT CARDIOVERSION N/A 08/27/2013   Procedure: TRANSESOPHAGEAL ECHOCARDIOGRAM (TEE);  Surgeon: [name], MD;  Location: [facility] ENDOSCOPY;  Service: Cardiovascular;  Laterality: N/A;  . TOTAL ABDOMINAL HYSTERECTOMY W/ BILATERAL SALPINGOOPHORECTOMY  1997   fibroids  . VULVA / PERINEUM BIOPSY  2015    There were no vitals filed for this visit.       Subjective Assessment - 05/11/16 0811    Subjective Tabitha Green reports that she has had Lt shoulder pain for the past 6 months with no known injury. Gradual onset of symptoms which are intermittent in nature and wariable in intensity.    Pertinent History kidney cancer; kidney transplant 3/17; cervical pain treated in PT    How long can you sit comfortably? 1-2 hours    Diagnostic tests xrays    Currently in Pain? Yes   Pain Score 4    Pain Location Shoulder   Pain Orientation Left   Pain Descriptors / Indicators Aching   Pain Type Chronic pain   Pain Radiating Towards down into  arm    Pain Onset More than a month ago   Pain Frequency Intermittent   Aggravating Factors  lying on Lt side; lifting; reaching; reaching behind back    Pain Relieving Factors icy hot; heat; tylenol             [facility] PT Assessment - 05/11/16 0001      Assessment   Medical Diagnosis Lt shoulder impingement    Referring Provider Dr Anitra Lauth    Onset Date/Surgical Date 11/07/15   Hand Dominance Left   Next MD Visit 12/17   Prior Therapy yes for neck pain      Precautions   Precautions --   Precaution Comments dialysis stent Rt UE      Balance Screen   Has the patient fallen in the past 6 months No   Has the patient had a decrease in activity level because of a fear of falling?  No   Is the  patient reluctant to leave their home because of a fear of falling?  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    Leisure household chores; grandchildren      Observation/Other Assessments   Focus on Therapeutic Outcomes (FOTO)  45% limitation      Sensation   Additional Comments WFL's per pt report      Posture/Postural Control   Posture Comments head forward; shoulders rounded and elevated; increased thoracic kyphosis; scapulae abducted and rotated along the thoracic wall      AROM   Right Shoulder Extension 57 Degrees   Right Shoulder Flexion 146 Degrees   Right Shoulder ABduction 144 Degrees   Right Shoulder Internal Rotation 45 Degrees   Right Shoulder External Rotation 90 Degrees   Left Shoulder Extension 61 Degrees   Left Shoulder Flexion 133 Degrees   Left Shoulder ABduction 151 Degrees   Left Shoulder Internal Rotation 47 Degrees   Left Shoulder External Rotation 84 Degrees     Strength   Overall Strength Comments 5/5 bilat UE except Lt shd flexio and abduction 4+/5; ER 4+/5 IR 5-/5      Palpation   Palpation comment tight and tender pecs; upper trap; leveator; teres; biceps                    [facility] Adult PT Treatment/Exercise - 05/11/16 0001      Neuro Re-ed    Neuro Re-ed Details  working on posture and alignment engaging posterior shoulder girdle muscles      Shoulder Exercises: Standing   Other Standing Exercises scap squeeze 10 sec x 10  x 2 sets with swim noodle      Shoulder Exercises: IT sales professional --  3 way doorway Lt UE only due to stent in Rt UE 30sec x2     Vasopneumatic   Number Minutes Vasopneumatic  15 minutes   Vasopnuematic Location  Shoulder  Lt   Vasopneumatic Pressure Low   Vasopneumatic Temperature  3*                PT Education - 05/11/16 0835    Education provided Yes   Education Details HEP posture and alignment     Person(s) Educated Patient   Methods Explanation;Demonstration;Tactile cues;Verbal cues;Handout   Comprehension Verbalized understanding;Returned demonstration;Verbal cues required;Tactile cues required             PT Long  Term Goals - 05/11/16 1332      PT LONG TERM GOAL #1   Title Improve posture and alignment with patient to demonstrate improved upright posture 06/22/16   Time 6   Period Weeks   Status New     PT LONG TERM GOAL #2   Title Improve posterior shoudler girdle strength with patient to demo engagement of posterior shoudler girdle musculature for functional activities of Lt UE 06/22/16   Time 6   Period Weeks   Status New     PT LONG TERM GOAL #3   Title Decrease pain with functional activity 06/22/16   Time 6   Period Weeks   Status New     PT LONG TERM GOAL #4   Title Independent in HEP 06/22/16   Time 6   Period Weeks   Status New     PT LONG TERM GOAL #5   Title Improve FOTO to </= 36% limitation 06/22/16   Time 6   Period Weeks   Status New               Plan - 05/11/16 1328    Clinical Impression Statement Patient presents with Lt shoulder pain of several months duration. She has poor posture and alignment; limited shouder ROM; decreased strength; abnormal movement patterns; pain.    Rehab Potential Good   PT Frequency 2x / week   PT Duration 6 weeks   PT Treatment/Interventions Patient/family education;ADLs/Self Care Home Management;Neuromuscular re-education;Cryotherapy;Electrical Stimulation;Iontophoresis 4mg /ml Dexamethasone;Moist Heat;Ultrasound;Therapeutic activities;Therapeutic exercise;Dry needling;Manual techniques   PT Next Visit Plan progress with stretching - supine prolonged snow angel; posterior shoudler girdle strengthening; manual work through pecs and anterior structures; biceps tendon stretch; modalities as indicated   Consulted and Agree with Plan of Care Patient      Patient will benefit from skilled therapeutic  intervention in order to improve the following deficits and impairments:  Postural dysfunction, Improper body mechanics, Pain, Decreased range of motion, Decreased mobility, Decreased strength, Decreased activity tolerance  Visit Diagnosis: Chronic left shoulder pain - Plan: PT plan of care cert/re-cert  Abnormal posture - Plan: PT plan of care cert/re-cert  Other symptoms and signs involving the musculoskeletal system - Plan: PT plan of care cert/re-cert     Problem List Patient Active Problem List   Diagnosis Date Noted  . Left shoulder pain 05/02/2016  . Wheeze 09/16/2015  . Cough 09/16/2015  . Acute bronchitis 09/16/2015  . History of renal carcinoma 05/14/2015  . Vulvar ulceration 06/22/2014  . End stage renal disease ([place]) 04/09/2014  . Mechanical complication of other vascular device, implant, and graft 04/09/2014  . Cervical spondylosis 10/10/2013  . Aortic insufficiency 08/20/2013  . Hyperglycemia 12/14/2012  . SVT (supraventricular tachycardia) ([place]) 12/13/2012  . Uremia 12/13/2012  . Chronic kidney disease (CKD), stage IV (severe) ([place]) 12/07/2012  . Bradycardia 12/07/2012  . VITAMIN D DEFICIENCY 09/08/2009  . CHRON GLOMERULONEPHRIT W/LES MEMBRANOUS GLN 09/08/2009  . FATIGUE 09/08/2009  . DIVERTICULOSIS, COLON 09/26/2008  . OSTEOPENIA 09/26/2008  . COLONIC POLYPS, HX OF 09/26/2008  . Hypothyroidism 10/02/2007  . OTHER AND UNSPECIFIED HYPERLIPIDEMIA 10/02/2007  . BENIGN POSITIONAL VERTIGO 10/02/2007  . Unspecified essential hypertension 10/02/2007  . Gout, unspecified 03/26/2007  . DISORDER, DYSMETABOLIC SYNDROME X 79/39/0300  . HX, PERSONAL, MALIGNANCY, KIDNEY 03/26/2007  . OSTEOARTHRITIS 12/26/2006    Timberlynn Kizziah Nilda Simmer  PT, MPH  05/11/2016, 1:39 PM  [facility] [place], [place], [address] Phone: [phone]  Fax:  [phone]  Name: OKLA QAZI MRN: [id number] Date of Birth:  11-May-1946

## 2016-05-17 DIAGNOSIS — B9789 Other viral agents as the cause of diseases classified elsewhere: Secondary | ICD-10-CM | POA: Diagnosis not present

## 2016-05-17 DIAGNOSIS — Z79899 Other long term (current) drug therapy: Secondary | ICD-10-CM | POA: Diagnosis not present

## 2016-05-17 DIAGNOSIS — Z940 Kidney transplant status: Secondary | ICD-10-CM | POA: Diagnosis not present

## 2016-05-17 DIAGNOSIS — B259 Cytomegaloviral disease, unspecified: Secondary | ICD-10-CM | POA: Diagnosis not present

## 2016-05-17 DIAGNOSIS — N250 Renal osteodystrophy: Secondary | ICD-10-CM | POA: Diagnosis not present

## 2016-05-17 DIAGNOSIS — E559 Vitamin D deficiency, unspecified: Secondary | ICD-10-CM | POA: Diagnosis not present

## 2016-05-17 DIAGNOSIS — E039 Hypothyroidism, unspecified: Secondary | ICD-10-CM | POA: Diagnosis not present

## 2016-05-17 DIAGNOSIS — E785 Hyperlipidemia, unspecified: Secondary | ICD-10-CM | POA: Diagnosis not present

## 2016-05-17 DIAGNOSIS — N189 Chronic kidney disease, unspecified: Secondary | ICD-10-CM | POA: Diagnosis not present

## 2016-05-18 ENCOUNTER — Ambulatory Visit (INDEPENDENT_AMBULATORY_CARE_PROVIDER_SITE_OTHER): Payer: Medicare Other | Admitting: Rehabilitative and Restorative Service Providers"

## 2016-05-18 ENCOUNTER — Encounter: Payer: Self-pay | Admitting: Rehabilitative and Restorative Service Providers"

## 2016-05-18 DIAGNOSIS — R29898 Other symptoms and signs involving the musculoskeletal system: Secondary | ICD-10-CM

## 2016-05-18 DIAGNOSIS — G8929 Other chronic pain: Secondary | ICD-10-CM | POA: Diagnosis not present

## 2016-05-18 DIAGNOSIS — M25512 Pain in left shoulder: Secondary | ICD-10-CM

## 2016-05-18 DIAGNOSIS — R293 Abnormal posture: Secondary | ICD-10-CM

## 2016-05-18 NOTE — Patient Instructions (Addendum)
ELBOW: Biceps - Standing   Can do this stretch standing at a kitchen counter  Standing in doorway, place one hand on wall, elbow straight. Lean forward. Hold _30__ seconds. _3__ reps per set,  _1-2 times/day   Resisted External Rotation: in Neutral - Bilateral   PALMS UP Sit or stand, tubing in both hands, elbows at sides, bent to 90, forearms forward. Pinch shoulder blades together and rotate forearms out. Keep elbows at sides. Repeat __10__ times per set. Do _2-3___ sets per session. Do _2-3___ sessions per day.   Low Row: Standing   Face anchor, feet shoulder width apart. Palms up, pull arms back, squeezing shoulder blades together. Repeat 10__ times per set. Do 2-3__ sets per session. Do 2-3__ sessions per week. Anchor Height: Waist   Strengthening: Resisted Extension   Hold tubing in right hand, arm forward. Pull arm back, elbow straight. Repeat _10___ times per set. Do 2-3____ sets per session. Do 2-3____ sessions per day.   SUPINE Tips A    Being in the supine position means to be lying on the back. Lying on the back is the position of least compression on the bones and discs of the spine, and helps to re-align the natural curves of the back. Hold 1 - 5 min - bend elbows up if you need a break.

## 2016-05-18 NOTE — Therapy (Signed)
[place], [place], [address] Phone: [phone]   Fax:  [phone]  Physical Therapy Treatment  Patient Details  Name: Tabitha Green MRN: [id number] Date of Birth: 06/02/1946 Referring Provider: Dr Anitra Lauth   Encounter Date: 05/18/2016      PT End of Session - 05/18/16 0807    Visit Number 2   Number of Visits 12   Date for PT Re-Evaluation 06/22/16   PT Start Time 0804   PT Stop Time 9024   PT Time Calculation (min) 53 min   Activity Tolerance Patient tolerated treatment well      Past Medical History:  Diagnosis Date  . Anemia of chronic kidney failure    r/t kidney function- receives Procrit  . Arthritis   . Chronic renal insufficiency, stage III (moderate) 10/2015   Post-transplant Cr 1.3, GFR 41 as of June 2017 f/u w/ [facility]  . CMV infection [facility]) summer 2017   Valcyte per ID/Renal transplant team  . Diverticulosis    a. 05/2012 colonoscopy  . Erosive lichen planus of vulva   . ESRD (end stage renal disease) ([place])    began dialysis 2014--followed by Dr. [name].  Received deceased donor kidney transplant 10/2015.  [name] FSGS (focal segmental glomerulosclerosis)    right kidney; hx of left renal cell cancer and got nephrectomy 1993.  [name] GERD (gastroesophageal reflux disease)   . Gout   . Heart murmur    (Diastolic) ECHO 0/9735 showed that this murmur is coming from pt's R arm AV fistula  . Hiatal hernia   . History of renal cell cancer 1993   Left nephrectomy  . Hyperlipidemia   . Hypertension   . Hypothyroidism   . Impingement syndrome of left shoulder 04/2016   Dr. Barbaraann Barthel  . Osteopenia   . Renal transplant recipient 2015-11-27  . SVT (supraventricular tachycardia) (HCC)     Past Surgical History:  Procedure Laterality Date  . AV FISTULA PLACEMENT Right    Right arm: aneurismal dilatation 2017 being followed by CV surgeons  . COLONOSCOPY  2003; 05/2012   diverticulosis, no polyps.  Recall 10 yrs  . colonoscopy with polypectomy     Dr Henrene Pastor  . KIDNEY TRANSPLANT  11/27/15   Deceased donor kidney transplant, with Thymoglobulin induction  . NEPHRECTOMY  1993   for malignancy- left  . RENAL BIOPSY     right  . TEE WITHOUT CARDIOVERSION N/A 08/27/2013   Procedure: TRANSESOPHAGEAL ECHOCARDIOGRAM (TEE);  Surgeon: [name], MD;  Location: [facility] ENDOSCOPY;  Service: Cardiovascular;  Laterality: N/A;  . TOTAL ABDOMINAL HYSTERECTOMY W/ BILATERAL SALPINGOOPHORECTOMY  1997   fibroids  . VULVA / PERINEUM BIOPSY  2015    There were no vitals filed for this visit.      Subjective Assessment - 05/18/16 0808    Subjective Patient reports that the sypmtoms have not been as bad. She has done her exercises some but not consistently.    Currently in Pain? Yes   Pain Score 4    Pain Location Shoulder   Pain Orientation Left   Pain Descriptors / Indicators Aching   Pain Type Chronic pain   Pain Onset More than a month ago   Pain Frequency Intermittent                         [facility] Adult PT Treatment/Exercise - 05/18/16 0001      Neuro Re-ed  Neuro Re-ed Details  working on posture and alignment engaging posterior shoulder girdle muscles      Shoulder Exercises: Supine   Other Supine Exercises snow angel Lt UE 2-3 min      Shoulder Exercises: Standing   Extension Left;10 reps;Theraband   Theraband Level (Shoulder Extension) Level 2 (Red)   Row Left;10 reps;Theraband   Theraband Level (Shoulder Row) Level 2 (Red)   Retraction Left;10 reps;Theraband   Theraband Level (Shoulder Retraction) Level 1 (Yellow)   Other Standing Exercises scap squeeze 10 sec x 10  x 2 sets with swim noodle      Shoulder Exercises: ROM/Strengthening   UBE (Upper Arm Bike) L4 4 min alt fwd/back      Shoulder Exercises: IT sales professional --  3 way doorway Lt UE only due to stent in Rt UE 30sec x2   Other Shoulder Stretches biceps stretch  30 sec x 3      Electrical Stimulation   Electrical Stimulation Location Lt shoudler    Electrical Stimulation Action IFC   Electrical Stimulation Parameters to tolerance   Electrical Stimulation Goals Pain;Tone     Vasopneumatic   Number Minutes Vasopneumatic  15 minutes   Vasopnuematic Location  Shoulder  Lt   Vasopneumatic Pressure Low   Vasopneumatic Temperature  3*     Manual Therapy   Manual therapy comments pt supine hooklying LE's on bolster    Soft tissue mobilization Lt pecs; upper trap; leveator; deltoid; medial scapular border    Myofascial Release upper trap   Passive ROM Lt shd flex; abd; ER                 PT Education - 05/18/16 0834    Education provided Yes   Education Details HEP    Person(s) Educated Patient   Methods Explanation;Demonstration;Tactile cues;Verbal cues;Handout   Comprehension Verbalized understanding;Returned demonstration;Verbal cues required;Tactile cues required             PT Long Term Goals - 05/18/16 0811      PT LONG TERM GOAL #1   Title Improve posture and alignment with patient to demonstrate improved upright posture 06/22/16   Time 6   Period Weeks   Status On-going     PT LONG TERM GOAL #2   Title Improve posterior shoudler girdle strength with patient to demo engagement of posterior shoudler girdle musculature for functional activities of Lt UE 06/22/16   Time 6   Period Weeks   Status On-going     PT LONG TERM GOAL #3   Title Decrease pain with functional activity 06/22/16   Time 6   Period Weeks   Status On-going     PT LONG TERM GOAL #4   Title Independent in HEP 06/22/16   Time 6   Period Weeks   Status On-going     PT LONG TERM GOAL #5   Title Improve FOTO to </= 36% limitation 06/22/16   Time 6   Period Weeks   Status On-going               Plan - 05/18/16 0856    Clinical Impression Statement Some improvement in pain per pt report. Progressed with exercise without difficulty.  Using Lt UE only for resistive exercises due to dyalisis stent in Rt UE. Good response to estim May get TENS unit for home    Rehab Potential Good   PT Frequency 2x / week   PT Duration 6 weeks  PT Treatment/Interventions Patient/family education;ADLs/Self Care Home Management;Neuromuscular re-education;Cryotherapy;Electrical Stimulation;Iontophoresis 4mg /ml Dexamethasone;Moist Heat;Ultrasound;Therapeutic activities;Therapeutic exercise;Dry needling;Manual techniques   PT Next Visit Plan progress with stretching; posterior shoudler girdle strengthening; manual work through pecs and anterior structures; modalities as indicated   Consulted and Agree with Plan of Care Patient      Patient will benefit from skilled therapeutic intervention in order to improve the following deficits and impairments:  Postural dysfunction, Improper body mechanics, Pain, Decreased range of motion, Decreased mobility, Decreased strength, Decreased activity tolerance  Visit Diagnosis: Chronic left shoulder pain  Abnormal posture  Other symptoms and signs involving the musculoskeletal system     Problem List Patient Active Problem List   Diagnosis Date Noted  . Left shoulder pain 05/02/2016  . Wheeze 09/16/2015  . Cough 09/16/2015  . Acute bronchitis 09/16/2015  . History of renal carcinoma 05/14/2015  . Vulvar ulceration 06/22/2014  . End stage renal disease ([place]) 04/09/2014  . Mechanical complication of other vascular device, implant, and graft 04/09/2014  . Cervical spondylosis 10/10/2013  . Aortic insufficiency 08/20/2013  . Hyperglycemia 12/14/2012  . SVT (supraventricular tachycardia) ([place]) 12/13/2012  . Uremia 12/13/2012  . Chronic kidney disease (CKD), stage IV (severe) ([place]) 12/07/2012  . Bradycardia 12/07/2012  . VITAMIN D DEFICIENCY 09/08/2009  . CHRON GLOMERULONEPHRIT W/LES MEMBRANOUS GLN 09/08/2009  . FATIGUE 09/08/2009  . DIVERTICULOSIS, COLON 09/26/2008  . OSTEOPENIA 09/26/2008  .  COLONIC POLYPS, HX OF 09/26/2008  . Hypothyroidism 10/02/2007  . OTHER AND UNSPECIFIED HYPERLIPIDEMIA 10/02/2007  . BENIGN POSITIONAL VERTIGO 10/02/2007  . Unspecified essential hypertension 10/02/2007  . Gout, unspecified 03/26/2007  . DISORDER, DYSMETABOLIC SYNDROME X 63/87/5643  . HX, PERSONAL, MALIGNANCY, KIDNEY 03/26/2007  . OSTEOARTHRITIS 12/26/2006    Brehanna Deveny Nilda Simmer PT, MPH  05/18/2016, 9:01 AM  [facility] [place], [place], [address] Phone: [phone]   Fax:  [phone]  Name: Tabitha Green MRN: [id number] Date of Birth: 07-19-1946

## 2016-05-25 ENCOUNTER — Encounter: Payer: Self-pay | Admitting: Rehabilitative and Restorative Service Providers"

## 2016-05-25 ENCOUNTER — Ambulatory Visit (INDEPENDENT_AMBULATORY_CARE_PROVIDER_SITE_OTHER): Payer: Medicare Other | Admitting: Rehabilitative and Restorative Service Providers"

## 2016-05-25 DIAGNOSIS — M25512 Pain in left shoulder: Secondary | ICD-10-CM | POA: Diagnosis not present

## 2016-05-25 DIAGNOSIS — R293 Abnormal posture: Secondary | ICD-10-CM | POA: Diagnosis not present

## 2016-05-25 DIAGNOSIS — G8929 Other chronic pain: Secondary | ICD-10-CM

## 2016-05-25 DIAGNOSIS — R29898 Other symptoms and signs involving the musculoskeletal system: Secondary | ICD-10-CM

## 2016-05-25 NOTE — Therapy (Signed)
[place], [place], [address] Phone: [phone]   Fax:  [phone]  Physical Therapy Treatment  Patient Details  Name: Tabitha Green MRN: [id number] Date of Birth: 12/28/1945 Referring Provider: Dr Anitra Lauth   Encounter Date: 05/25/2016      PT End of Session - 05/25/16 0805    Visit Number 3   Number of Visits 12   Date for PT Re-Evaluation 06/22/16   PT Start Time 0803   PT Stop Time 0858   PT Time Calculation (min) 55 min   Activity Tolerance Patient tolerated treatment well      Past Medical History:  Diagnosis Date  . Anemia of chronic kidney failure    r/t kidney function- receives Procrit  . Arthritis   . Chronic renal insufficiency, stage III (moderate) 10/2015   Post-transplant Cr 1.3, GFR 41 as of June 2017 f/u w/ [facility]  . CMV infection [facility]) summer 2017   Valcyte per ID/Renal transplant team  . Diverticulosis    a. 05/2012 colonoscopy  . Erosive lichen planus of vulva   . ESRD (end stage renal disease) ([place])    began dialysis 2014--followed by Dr. [name].  Received deceased donor kidney transplant 10/2015.  [name] FSGS (focal segmental glomerulosclerosis)    right kidney; hx of left renal cell cancer and got nephrectomy 1993.  [name] GERD (gastroesophageal reflux disease)   . Gout   . Heart murmur    (Diastolic) ECHO 12/4625 showed that this murmur is coming from pt's R arm AV fistula  . Hiatal hernia   . History of renal cell cancer 1993   Left nephrectomy  . Hyperlipidemia   . Hypertension   . Hypothyroidism   . Impingement syndrome of left shoulder 04/2016   Dr. Barbaraann Barthel  . Osteopenia   . Renal transplant recipient December 03, 2015  . SVT (supraventricular tachycardia) ([facility])     Past Surgical History:  Procedure Laterality Date  . AV FISTULA PLACEMENT Right    Right arm: aneurismal dilatation 2017 being followed by CV surgeons  . COLONOSCOPY  2003; 05/2012   diverticulosis, no polyps.  Recall 10 yrs  . colonoscopy with polypectomy     Dr Henrene Pastor  . KIDNEY TRANSPLANT  2015/12/03   Deceased donor kidney transplant, with Thymoglobulin induction  . NEPHRECTOMY  1993   for malignancy- left  . RENAL BIOPSY     right  . TEE WITHOUT CARDIOVERSION N/A 08/27/2013   Procedure: TRANSESOPHAGEAL ECHOCARDIOGRAM (TEE);  Surgeon: [name], MD;  Location: [facility] ENDOSCOPY;  Service: Cardiovascular;  Laterality: N/A;  . TOTAL ABDOMINAL HYSTERECTOMY W/ BILATERAL SALPINGOOPHORECTOMY  1997   fibroids  . VULVA / PERINEUM BIOPSY  2015    There were no vitals filed for this visit.      Subjective Assessment - 05/25/16 0806    Subjective Shoulder is feeling better. She is working on her exercises "some" at home. Tweaked her shoulder this morning reaching behind her. Not to bad.    Currently in Pain? Yes   Pain Score 3    Pain Location Shoulder   Pain Orientation Left   Pain Descriptors / Indicators Aching   Pain Type Chronic pain   Pain Onset More than a month ago                         [facility] Adult PT Treatment/Exercise - 05/25/16 0001      Shoulder Exercises:  Supine   Other Supine Exercises snow angel Lt UE 2-3 min   added noodle   Other Supine Exercises shd press 10 secx10     Shoulder Exercises: Standing   Extension Left;Theraband;20 reps   Theraband Level (Shoulder Extension) Level 2 (Red)   Row Left;Theraband;20 reps   Theraband Level (Shoulder Row) Level 2 (Red)   Retraction Left;Theraband;20 reps   Theraband Level (Shoulder Retraction) Level 1 (Yellow)   Other Standing Exercises scap squeeze 10 sec x 10  x 2 sets with swim noodle      Shoulder Exercises: Stretch   Corner Stretch --  3 way doorway Lt UE only due to stent in Rt UE 30sec x2   Corner Stretch Limitations working on proper position and technique   Other Shoulder Stretches biceps stretch 30 sec x 3      Electrical Stimulation   Electrical Stimulation Location Lt  shoudler    Electrical Stimulation Action IFC   Electrical Stimulation Parameters to tolerance   Electrical Stimulation Goals Pain;Tone     Vasopneumatic   Number Minutes Vasopneumatic  15 minutes   Vasopnuematic Location  Shoulder  Lt   Vasopneumatic Pressure Low   Vasopneumatic Temperature  3*     Manual Therapy   Manual therapy comments pt supine hooklying LE's on bolster    Soft tissue mobilization Lt pecs; upper trap; leveator; deltoid; medial scapular border    Myofascial Release upper trap   Passive ROM Lt shd flex; abd; ER                      PT Long Term Goals - 05/25/16 0807      PT LONG TERM GOAL #1   Title Improve posture and alignment with patient to demonstrate improved upright posture 06/22/16   Time 6   Period Weeks   Status On-going     PT LONG TERM GOAL #2   Title Improve posterior shoudler girdle strength with patient to demo engagement of posterior shoudler girdle musculature for functional activities of Lt UE 06/22/16   Time 6   Period Weeks   Status On-going     PT LONG TERM GOAL #3   Title Decrease pain with functional activity 06/22/16   Time 6   Period Weeks   Status On-going     PT LONG TERM GOAL #4   Title Independent in HEP 06/22/16   Time 6   Period Weeks   Status On-going     PT LONG TERM GOAL #5   Title Improve FOTO to </= 36% limitation 06/22/16   Time 6   Period Weeks   Status On-going               Plan - 05/25/16 9242    Clinical Impression Statement Gradually progressing with improved mobility and decreased pain. Continues to work [employer] UE only with stretching and strengthening due to dyalisis stent in Rt UE. No new exercises today. Working on Nutritional therapist for [employer] and encouraging pt to be consistent with HEP.  Progressing well toward stated goals of therapy.    Rehab Potential Good   PT Frequency 2x / week   PT Duration 6 weeks   PT Treatment/Interventions Patient/family education;ADLs/Self Care Home  Management;Neuromuscular re-education;Cryotherapy;Electrical Stimulation;Iontophoresis 4mg /ml Dexamethasone;Moist Heat;Ultrasound;Therapeutic activities;Therapeutic exercise;Dry needling;Manual techniques   PT Next Visit Plan progress with stretching; posterior shoudler girdle strengthening; manual work through pecs and anterior structures; modalities as indicated      Patient will benefit  from skilled therapeutic intervention in order to improve the following deficits and impairments:  Postural dysfunction, Improper body mechanics, Pain, Decreased range of motion, Decreased mobility, Decreased strength, Decreased activity tolerance  Visit Diagnosis: Chronic left shoulder pain  Abnormal posture  Other symptoms and signs involving the musculoskeletal system     Problem List Patient Active Problem List   Diagnosis Date Noted  . Left shoulder pain 05/02/2016  . Wheeze 09/16/2015  . Cough 09/16/2015  . Acute bronchitis 09/16/2015  . History of renal carcinoma 05/14/2015  . Vulvar ulceration 06/22/2014  . End stage renal disease ([place]) 04/09/2014  . Mechanical complication of other vascular device, implant, and graft 04/09/2014  . Cervical spondylosis 10/10/2013  . Aortic insufficiency 08/20/2013  . Hyperglycemia 12/14/2012  . SVT (supraventricular tachycardia) ([place]) 12/13/2012  . Uremia 12/13/2012  . Chronic kidney disease (CKD), stage IV (severe) ([place]) 12/07/2012  . Bradycardia 12/07/2012  . VITAMIN D DEFICIENCY 09/08/2009  . CHRON GLOMERULONEPHRIT W/LES MEMBRANOUS GLN 09/08/2009  . FATIGUE 09/08/2009  . DIVERTICULOSIS, COLON 09/26/2008  . OSTEOPENIA 09/26/2008  . COLONIC POLYPS, HX OF 09/26/2008  . Hypothyroidism 10/02/2007  . OTHER AND UNSPECIFIED HYPERLIPIDEMIA 10/02/2007  . BENIGN POSITIONAL VERTIGO 10/02/2007  . Unspecified essential hypertension 10/02/2007  . Gout, unspecified 03/26/2007  . DISORDER, DYSMETABOLIC SYNDROME X 37/36/6815  . HX, PERSONAL, MALIGNANCY,  KIDNEY 03/26/2007  . OSTEOARTHRITIS 12/26/2006    Enoc Getter Nilda Simmer PT, MPH  05/25/2016, 8:45 AM  [facility] [place], [place], [address] Phone: [phone]   Fax:  [phone]  Name: Tabitha Green MRN: [id number] Date of Birth: 04/02/1946

## 2016-05-30 DIAGNOSIS — Z79899 Other long term (current) drug therapy: Secondary | ICD-10-CM | POA: Diagnosis not present

## 2016-05-30 DIAGNOSIS — Z940 Kidney transplant status: Secondary | ICD-10-CM | POA: Diagnosis not present

## 2016-05-30 DIAGNOSIS — R799 Abnormal finding of blood chemistry, unspecified: Secondary | ICD-10-CM | POA: Diagnosis not present

## 2016-05-30 DIAGNOSIS — B259 Cytomegaloviral disease, unspecified: Secondary | ICD-10-CM | POA: Diagnosis not present

## 2016-06-01 ENCOUNTER — Ambulatory Visit (INDEPENDENT_AMBULATORY_CARE_PROVIDER_SITE_OTHER): Payer: Medicare Other | Admitting: Rehabilitative and Restorative Service Providers"

## 2016-06-01 ENCOUNTER — Encounter: Payer: Self-pay | Admitting: Rehabilitative and Restorative Service Providers"

## 2016-06-01 DIAGNOSIS — R293 Abnormal posture: Secondary | ICD-10-CM

## 2016-06-01 DIAGNOSIS — R29898 Other symptoms and signs involving the musculoskeletal system: Secondary | ICD-10-CM | POA: Diagnosis not present

## 2016-06-01 DIAGNOSIS — G8929 Other chronic pain: Secondary | ICD-10-CM

## 2016-06-01 DIAGNOSIS — M25512 Pain in left shoulder: Secondary | ICD-10-CM

## 2016-06-01 NOTE — Therapy (Addendum)
[place], [place], [address] Phone: [phone]   Fax:  [phone]  Physical Therapy Treatment  Patient Details  Name: Tabitha Green MRN: [id number] Date of Birth: February 10, 1946 Referring Provider: Dr Anitra Lauth  Encounter Date: 06/01/2016      PT End of Session - 06/01/16 0806    Visit Number 4   Number of Visits 12   Date for PT Re-Evaluation 06/22/16   PT Start Time 0803   PT Stop Time 0858   PT Time Calculation (min) 55 min   Activity Tolerance Patient tolerated treatment well      Past Medical History:  Diagnosis Date  . Anemia of chronic kidney failure    r/t kidney function- receives Procrit  . Arthritis   . Chronic renal insufficiency, stage III (moderate) 10/2015   Post-transplant Cr 1.3, GFR 41 as of June 2017 f/u w/ [facility]  . CMV infection [facility]) summer 2017   Valcyte per ID/Renal transplant team  . Diverticulosis    a. 05/2012 colonoscopy  . Erosive lichen planus of vulva   . ESRD (end stage renal disease) ([place])    began dialysis 2014--followed by Dr. [name].  Received deceased donor kidney transplant 10/2015.  [name] FSGS (focal segmental glomerulosclerosis)    right kidney; hx of left renal cell cancer and got nephrectomy 1993.  [name] GERD (gastroesophageal reflux disease)   . Gout   . Heart murmur    (Diastolic) ECHO 11/812 showed that this murmur is coming from pt's R arm AV fistula  . Hiatal hernia   . History of renal cell cancer 1993   Left nephrectomy  . Hyperlipidemia   . Hypertension   . Hypothyroidism   . Impingement syndrome of left shoulder 04/2016   Dr. Barbaraann Barthel  . Osteopenia   . Renal transplant recipient 11/24/15  . SVT (supraventricular tachycardia) (HCC)     Past Surgical History:  Procedure Laterality Date  . AV FISTULA PLACEMENT Right    Right arm: aneurismal dilatation 2017 being followed by CV surgeons  . COLONOSCOPY  2003; 05/2012   diverticulosis, no polyps.  Recall 10 yrs  . colonoscopy with polypectomy     Dr Henrene Pastor  . KIDNEY TRANSPLANT  11/24/15   Deceased donor kidney transplant, with Thymoglobulin induction  . NEPHRECTOMY  1993   for malignancy- left  . RENAL BIOPSY     right  . TEE WITHOUT CARDIOVERSION N/A 08/27/2013   Procedure: TRANSESOPHAGEAL ECHOCARDIOGRAM (TEE);  Surgeon: [name], MD;  Location: [facility] ENDOSCOPY;  Service: Cardiovascular;  Laterality: N/A;  . TOTAL ABDOMINAL HYSTERECTOMY W/ BILATERAL SALPINGOOPHORECTOMY  1997   fibroids  . VULVA / PERINEUM BIOPSY  2015    There were no vitals filed for this visit.      Subjective Assessment - 06/01/16 0808    Subjective Continues to note improvement. Mostly has pain at night lying on the Lt side - which is her favorite sleeping side. She is working on the exercises some at home. Does notice that she can use Lt UE for more functional activities without discomfort.    Currently in Pain? Yes   Pain Score 2    Pain Location Shoulder   Pain Orientation Left   Pain Descriptors / Indicators Aching            [facility] PT Assessment - 06/01/16 0001      Assessment   Medical Diagnosis Lt shoulder impingement    Referring  Provider Dr Anitra Lauth   Onset Date/Surgical Date 11/07/15   Hand Dominance Left   Next MD Visit 12/17   Prior Therapy yes for neck pain      Observation/Other Assessments   Focus on Therapeutic Outcomes (FOTO)  41% limitation      AROM   Right Shoulder Extension 57 Degrees   Right Shoulder Flexion 146 Degrees   Right Shoulder ABduction 144 Degrees   Right Shoulder Internal Rotation 45 Degrees   Right Shoulder External Rotation 90 Degrees   Left Shoulder Extension 63 Degrees   Left Shoulder Flexion 146 Degrees   Left Shoulder ABduction 151 Degrees   Left Shoulder Internal Rotation 47 Degrees   Left Shoulder External Rotation 87 Degrees     Strength   Overall Strength Comments 5/5 bilat UE except Lt shd flexion and  abduction 5-/5; ER 5/5 IR 5/5      Palpation   Palpation comment decreased but persistent tightness and tenderness pecs; upper trap; leveator; teres; biceps                      [facility] Adult PT Treatment/Exercise - 06/01/16 0001      Shoulder Exercises: Supine   Other Supine Exercises snow angel Lt UE 2-3 min   added noodle   Other Supine Exercises shd press 10 secx10     Shoulder Exercises: Standing   ABduction Left;20 reps;Theraband  back at wall with swim noodle - scaption standing on TB    Theraband Level (Shoulder ABduction) Level 2 (Red)   Extension Left;Theraband;20 reps   Theraband Level (Shoulder Extension) Level 3 (Green)   Row Left;Theraband;20 reps   Theraband Level (Shoulder Row) Level 3 (Green)   Retraction Left;Theraband;20 reps   Theraband Level (Shoulder Retraction) Level 1 (Yellow)   Other Standing Exercises scap squeeze 10 sec x 10  x 2 sets with swim noodle    Other Standing Exercises wall push up x 10      Shoulder Exercises: ROM/Strengthening   UBE (Upper Arm Bike) L3 5 min alt fwd/back      Electrical Stimulation   Electrical Stimulation Location Lt shoudler    Electrical Stimulation Action IFC   Electrical Stimulation Parameters to tolerance   Electrical Stimulation Goals Pain;Tone     Vasopneumatic   Number Minutes Vasopneumatic  15 minutes   Vasopnuematic Location  Shoulder  Lt    Vasopneumatic Pressure Low   Vasopneumatic Temperature  3*     Manual Therapy   Manual therapy comments pt supine hooklying LE's on bolster    Soft tissue mobilization Lt pecs; upper trap; leveator; deltoid; medial scapular border    Myofascial Release upper trap   Passive ROM Lt shd flex; abd; ER                      PT Long Term Goals - 06/01/16 0807      PT LONG TERM GOAL #1   Title Improve posture and alignment with patient to demonstrate improved upright posture 06/22/16   Time 6   Period Weeks   Status Achieved     PT LONG  TERM GOAL #2   Title Improve posterior shoudler girdle strength with patient to demo engagement of posterior shoudler girdle musculature for functional activities of Lt UE 06/22/16   Time 6   Period Weeks   Status Achieved     PT LONG TERM GOAL #3   Title Decrease pain with functional activity  06/22/16   Time 6   Period Weeks   Status Achieved     PT LONG TERM GOAL #4   Title Independent in HEP 06/22/16   Time 6   Period Weeks   Status Achieved     PT LONG TERM GOAL #5   Title Improve FOTO to </= 36% limitation 06/22/16   Time 6   Period Weeks   Status Partially Met               Plan - 06/01/16 0810    Clinical Impression Statement Progressing well with shoulder rehab. She has less pain, incresed ROM, increased functional activities with less pain. Progressing well toward stated goals of therapy, accomplishing goals except for FOTO goal. She would like to be placed on hold and continue with independent HEP to be sure she does not need further therapy.    Rehab Potential Good   PT Frequency 2x / week   PT Duration 6 weeks   PT Treatment/Interventions Patient/family education;ADLs/Self Care Home Management;Neuromuscular re-education;Cryotherapy;Electrical Stimulation;Iontophoresis 66m/ml Dexamethasone;Moist Heat;Ultrasound;Therapeutic activities;Therapeutic exercise;Dry needling;Manual techniques   PT Next Visit Plan hold PT - patient will continue with I HEp and call with any questions or problems or if she feels she needs to continue with PT after vacation. Will call if she wants to reschedule    Consulted and Agree with Plan of Care Patient      Patient will benefit from skilled therapeutic intervention in order to improve the following deficits and impairments:  Postural dysfunction, Improper body mechanics, Pain, Decreased range of motion, Decreased mobility, Decreased strength, Decreased activity tolerance  Visit Diagnosis: Chronic left shoulder pain  Abnormal  posture  Other symptoms and signs involving the musculoskeletal system     Problem List Patient Active Problem List   Diagnosis Date Noted  . Left shoulder pain 05/02/2016  . Wheeze 09/16/2015  . Cough 09/16/2015  . Acute bronchitis 09/16/2015  . History of renal carcinoma 05/14/2015  . Vulvar ulceration 06/22/2014  . [place] stage renal disease ([place] 04/09/2014  . Mechanical complication of other vascular device, implant, and graft 04/09/2014  . Cervical spondylosis 10/10/2013  . Aortic insufficiency 08/20/2013  . Hyperglycemia 12/14/2012  . SVT (supraventricular tachycardia) ([place] 12/13/2012  . Uremia 12/13/2012  . Chronic kidney disease (CKD), stage IV (severe) ([place] 12/07/2012  . Bradycardia 12/07/2012  . VITAMIN D DEFICIENCY 09/08/2009  . CHRON GLOMERULONEPHRIT W/LES MEMBRANOUS GLN 09/08/2009  . FATIGUE 09/08/2009  . DIVERTICULOSIS, COLON 09/26/2008  . OSTEOPENIA 09/26/2008  . COLONIC POLYPS, HX OF 09/26/2008  . Hypothyroidism 10/02/2007  . OTHER AND UNSPECIFIED HYPERLIPIDEMIA 10/02/2007  . BENIGN POSITIONAL VERTIGO 10/02/2007  . Unspecified essential hypertension 10/02/2007  . Gout, unspecified 03/26/2007  . DISORDER, DYSMETABOLIC SYNDROME X 050/35/4656 . HX, PERSONAL, MALIGNANCY, KIDNEY 03/26/2007  . OSTEOARTHRITIS 12/26/2006    Celyn PNilda SimmerPT, MPH  06/01/2016, 12:23 PM  [facility] [address]Phone: [phone]  Fax:  [phone] Name: Tabitha GUARDINOMRN: [id number] of Birth: [birth year] PHYSICAL THERAPY DISCHARGE SUMMARY  Visits from Start of Care: 4  Current functional level related to goals / functional outcomes: See last PT progress note for discharge status.   Remaining deficits: See note.   Education / Equipment: HEP Plan: Patient agrees to discharge.  Patient goals were partially met. Patient is being discharged due to not returning since the last  visit.  ?????    Celyn  Nilda Simmer PT, MPH 07/06/16 3:59 PM

## 2016-06-07 ENCOUNTER — Ambulatory Visit (INDEPENDENT_AMBULATORY_CARE_PROVIDER_SITE_OTHER): Payer: Medicare Other

## 2016-06-07 ENCOUNTER — Encounter: Payer: Self-pay | Admitting: Family Medicine

## 2016-06-07 DIAGNOSIS — Z1231 Encounter for screening mammogram for malignant neoplasm of breast: Secondary | ICD-10-CM | POA: Diagnosis not present

## 2016-06-07 DIAGNOSIS — M810 Age-related osteoporosis without current pathological fracture: Secondary | ICD-10-CM

## 2016-06-07 DIAGNOSIS — E2839 Other primary ovarian failure: Secondary | ICD-10-CM

## 2016-06-07 DIAGNOSIS — Z0000 Encounter for general adult medical examination without abnormal findings: Secondary | ICD-10-CM

## 2016-06-07 HISTORY — DX: Age-related osteoporosis without current pathological fracture: M81.0

## 2016-06-07 LAB — HM DEXA SCAN

## 2016-06-07 LAB — HM MAMMOGRAPHY

## 2016-06-09 ENCOUNTER — Other Ambulatory Visit: Payer: Self-pay | Admitting: Family Medicine

## 2016-06-09 ENCOUNTER — Ambulatory Visit (INDEPENDENT_AMBULATORY_CARE_PROVIDER_SITE_OTHER): Payer: Medicare Other | Admitting: Family Medicine

## 2016-06-09 ENCOUNTER — Encounter: Payer: Self-pay | Admitting: Family Medicine

## 2016-06-09 DIAGNOSIS — G8929 Other chronic pain: Secondary | ICD-10-CM

## 2016-06-09 DIAGNOSIS — M25512 Pain in left shoulder: Secondary | ICD-10-CM

## 2016-06-09 MED ORDER — ALENDRONATE SODIUM 70 MG PO TABS: 70.0000 mg | ORAL_TABLET | ORAL | 11 refills | Status: DC

## 2016-06-09 NOTE — Patient Instructions (Signed)
Continue the home exercises for 4 more weeks - try to do them most days of the week. Follow up with me as needed. Consider injection, revisiting physical therapy, nitro patches if you don't continue to improve.

## 2016-06-13 DIAGNOSIS — Z940 Kidney transplant status: Secondary | ICD-10-CM | POA: Diagnosis not present

## 2016-06-13 DIAGNOSIS — Z79899 Other long term (current) drug therapy: Secondary | ICD-10-CM | POA: Diagnosis not present

## 2016-06-13 DIAGNOSIS — R799 Abnormal finding of blood chemistry, unspecified: Secondary | ICD-10-CM | POA: Diagnosis not present

## 2016-06-13 NOTE — Assessment & Plan Note (Signed)
2/2 impingement, mild AC arthritis.  Clinically improved with physical therapy, home exercises.  Tylenol as needed.  F/u prn.

## 2016-06-13 NOTE — Progress Notes (Signed)
PCP and consultation requested by: Tammi Sou, MD  Subjective:   HPI: Patient is a 70 y.o. female here for left shoulder pain.  9/21: Patient reports about 6 months ago she started to get pain in anterolateral left shoulder. Describes pain as an ache, up to 5/10 at the most. Worse lying on this shoulder, at nighttime. Is left handed. Tried tylenol, icy hot. No prior injuries to this arm - believes she had an injection to this shoulder a long time ago though. No skin changes, numbness.  11/2: Patient reports she feels improved from last visit. Still doing home exercises. Physical therapy is now on hold. Pain is 1/10 at most. Not taking any medicines. No skin changes, numbness.  Past Medical History:  Diagnosis Date  . Anemia of chronic kidney failure    r/t kidney function- receives Procrit  . Arthritis   . Chronic renal insufficiency, stage III (moderate) 10/2015   Post-transplant Cr 1.3, GFR 41 as of June 2017 f/u w/ [facility]  . CMV infection [facility]) summer 2017   Valcyte per ID/Renal transplant team  . Diverticulosis    a. 05/2012 colonoscopy  . Erosive lichen planus of vulva   . ESRD (end stage renal disease) ([place])    began dialysis 2014--followed by Dr. [name].  Received deceased donor kidney transplant 10/2015.  [name] FSGS (focal segmental glomerulosclerosis)    right kidney; hx of left renal cell cancer and got nephrectomy 1993.  [name] GERD (gastroesophageal reflux disease)   . Gout   . Heart murmur    (Diastolic) ECHO 09/9922 showed that this murmur is coming from pt's R arm AV fistula  . Hiatal hernia   . History of renal cell cancer 1993   Left nephrectomy  . Hyperlipidemia   . Hypertension   . Hypothyroidism   . Impingement syndrome of left shoulder 04/2016   Dr. Barbaraann Barthel  . Osteopenia   . Osteoporosis 06/07/2016   DEXA T-score of -3.1.  Fosamax started 06/2016.  Repeat DEXA 2 yrs.  . Renal transplant recipient 11/06/15  . SVT  (supraventricular tachycardia) ([place])     Current Outpatient Prescriptions on File Prior to Visit  Medication Sig Dispense Refill  . acetaminophen (TYLENOL) 500 MG tablet Take 500-750 mg by mouth every 6 (six) hours as needed for pain.    [name] aspirin EC 81 MG EC tablet Take 1 tablet (81 mg total) by mouth daily.    Skipper Cliche SALINE NASAL DROPS NA Place 2 sprays into both nostrils daily.    . Camphor-Eucalyptus-Menthol (VICKS VAPORUB EX) Apply 1 application topically daily.    . clobetasol ointment (TEMOVATE) 2.68 % Apply 1 application topically 2 (two) times daily. Apply to affected area for two weeks then once a day for 2 weeks. 30 g 2  . docusate sodium (COLACE) 100 MG capsule Take 1 capsule by mouth 2 (two) times daily as needed.  11  . famotidine (PEPCID) 20 MG tablet Take 1 tablet by mouth 2 (two) times daily.  11  . levothyroxine (SYNTHROID, LEVOTHROID) 125 MCG tablet Take 1 tablet (125 mcg total) by mouth daily. 30 tablet 3  . loratadine (CLARITIN) 10 MG tablet Take 20 mg by mouth as needed for allergies.    [name] meclizine (ANTIVERT) 25 MG tablet Take 1 tablet (25 mg total) by mouth 3 (three) times daily as needed for dizziness or nausea. 20 tablet 0  . mycophenolate (MYFORTIC) 360 MG TBEC EC tablet Take 360 mg by mouth 2 (two)  times daily.    [name] sulfamethoxazole-trimethoprim (BACTRIM,SEPTRA) 400-80 MG tablet Take 1 tablet by mouth daily.    . tacrolimus (PROGRAF) 1 MG capsule Take 1 mg by mouth. 4 tablets in the am and 3 tablet in the pm    . triamcinolone (NASACORT ALLERGY 24HR) 55 MCG/ACT AERO nasal inhaler Place 2 sprays into the nose daily.    . valGANciclovir (VALCYTE) 450 MG tablet Take 2 tablets by mouth 2 (two) times daily.    . verapamil (CALAN-SR) 240 MG CR tablet Take 1 tablet by mouth daily.    . Vitamin D, Ergocalciferol, (DRISDOL) 50000 units CAPS capsule Take 50,000 Units by mouth every 7 (seven) days.     No current facility-administered medications on file prior to visit.      Past Surgical History:  Procedure Laterality Date  . AV FISTULA PLACEMENT Right    Right arm: aneurismal dilatation 2017 being followed by CV surgeons  . COLONOSCOPY  2003; 05/2012   diverticulosis, no polyps.  Recall 10 yrs  . colonoscopy with polypectomy     Dr Henrene Pastor  . DEXA  06/07/2016   T-score -3.1  . KIDNEY TRANSPLANT  11/19/2015   Deceased donor kidney transplant, with Thymoglobulin induction  . NEPHRECTOMY  1993   for malignancy- left  . RENAL BIOPSY     right  . TEE WITHOUT CARDIOVERSION N/A 08/27/2013   Procedure: TRANSESOPHAGEAL ECHOCARDIOGRAM (TEE);  Surgeon: [name], MD;  Location: [facility] ENDOSCOPY;  Service: Cardiovascular;  Laterality: N/A;  . TOTAL ABDOMINAL HYSTERECTOMY W/ BILATERAL SALPINGOOPHORECTOMY  1997   fibroids  . VULVA / PERINEUM BIOPSY  2015    Allergies  Allergen Reactions  . Cefaclor Rash  . Naproxen Rash  . Enalapril Maleate Cough  . Metoprolol Tartrate Rash    On legs    Social History   Social History  . Marital status: Married    Spouse name: N/A  . Number of children: 2  . Years of education: N/A   Occupational History  . Retired    Social History Main Topics  . Smoking status: Never Smoker  . Smokeless tobacco: Never Used  . Alcohol use Yes     Comment: rarely  . Drug use: No  . Sexual activity: Not Currently    Birth control/ protection: Surgical   Other Topics Concern  . Not on file   Social History Narrative   Lives in [place] with husband.  Retired.  Previously worked in [employer] @ [employer]   No T/A/Ds.    Family History  Problem Relation Age of Onset  . Deep vein thrombosis Mother     post thyroid surgery  . Hypertension Mother   . Heart attack Father 41    deceased  . Hypertension Father   . Cancer Sister     breast  . Cancer Paternal Aunt     pancreatic  . Diabetes Maternal Grandfather   . Stroke Paternal Grandmother     in 18s  . Colon cancer Neg Hx   . Esophageal cancer Neg Hx   .  Stomach cancer Neg Hx   . Rectal cancer Neg Hx     BP (!) 142/85   Pulse 78   Ht 5\' 6"  (1.676 m)   Wt 206 lb (93.4 kg)   BMI 33.25 kg/m   Review of Systems: See HPI above.    Objective:  Physical Exam:  Gen: NAD, comfortable in exam room  Left shoulder: No swelling, ecchymoses.  No gross deformity. No TTP AC joint.  No biceps tendon or other tenderness. FROM with minimal painful arc. Negative Hawkins, negative Neers. Negative Yergasons. Strength 5/5 with empty can and resisted internal/external rotation. Negative crossover adduction NV intact distally.  Right shoulder: FROM without pain.    Assessment & Plan:  1. Left shoulder pain - 2/2 impingement, mild AC arthritis.  Clinically improved with physical therapy, home exercises.  Tylenol as needed.  F/u prn.

## 2016-06-14 ENCOUNTER — Encounter: Payer: Self-pay | Admitting: Family Medicine

## 2016-06-14 ENCOUNTER — Ambulatory Visit (INDEPENDENT_AMBULATORY_CARE_PROVIDER_SITE_OTHER): Payer: Medicare Other | Admitting: Family Medicine

## 2016-06-14 VITALS — BP 125/82 | HR 82 | Temp 98.2°F | Resp 16 | Wt 208.0 lb

## 2016-06-14 DIAGNOSIS — J069 Acute upper respiratory infection, unspecified: Secondary | ICD-10-CM

## 2016-06-14 MED ORDER — AZITHROMYCIN 250 MG PO TABS: ORAL_TABLET | ORAL | 0 refills | Status: DC

## 2016-06-14 NOTE — Progress Notes (Signed)
Pre visit review using our clinic review tool, if applicable. No additional management support is needed unless otherwise documented below in the visit note. 

## 2016-06-14 NOTE — Progress Notes (Signed)
OFFICE VISIT  06/14/2016   CC:  Chief Complaint  Patient presents with  . Sinusitis    Sinus pressure and pain, cough    HPI:    Patient is a 70 y.o. Caucasian female who presents for respiratory symptoms. Onset 5-6 days ago, coughing, sinuses hurt/feel pressure.  No ST.  Some HA. No body aches.  Cough is productive.  No SOB, wheezing, or chest tightness.  No fevers. No n/v/d or malaise.  Mild fatigue.  Says most recent [facility] f/u 1 mo ago or so, says renal fxn was stable--I haven't received these records yet.    Past Medical History:  Diagnosis Date  . Anemia of chronic kidney failure    r/t kidney function- receives Procrit  . Arthritis   . Chronic renal insufficiency, stage III (moderate) 10/2015   Post-transplant Cr 1.3, GFR 41 as of June 2017 f/u w/ [facility]  . CMV infection [facility]) summer 2017   Valcyte per ID/Renal transplant team  . Diverticulosis    a. 05/2012 colonoscopy  . Erosive lichen planus of vulva   . ESRD (end stage renal disease) ([place])    began dialysis 2014--followed by Dr. [name].  Received deceased donor kidney transplant 10/2015.  [name] FSGS (focal segmental glomerulosclerosis)    right kidney; hx of left renal cell cancer and got nephrectomy 1993.  [name] GERD (gastroesophageal reflux disease)   . Gout   . Heart murmur    (Diastolic) ECHO 04/5187 showed that this murmur is coming from pt's R arm AV fistula  . Hiatal hernia   . History of renal cell cancer 1993   Left nephrectomy  . Hyperlipidemia   . Hypertension   . Hypothyroidism   . Impingement syndrome of left shoulder 04/2016   Dr. Barbaraann Barthel  . Osteopenia   . Osteoporosis 06/07/2016   DEXA T-score of -3.1.  Fosamax started 06/2016.  Repeat DEXA 2 yrs.  . Renal transplant recipient 12/06/2015  . SVT (supraventricular tachycardia) ([facility])     Past Surgical History:  Procedure Laterality Date  . AV FISTULA PLACEMENT Right    Right arm: aneurismal dilatation 2017 being followed  by CV surgeons  . COLONOSCOPY  2003; 05/2012   diverticulosis, no polyps.  Recall 10 yrs  . colonoscopy with polypectomy     Dr Henrene Pastor  . DEXA  06/07/2016   T-score -3.1  . KIDNEY TRANSPLANT  Dec 06, 2015   Deceased donor kidney transplant, with Thymoglobulin induction  . NEPHRECTOMY  1993   for malignancy- left  . RENAL BIOPSY     right  . TEE WITHOUT CARDIOVERSION N/A 08/27/2013   Procedure: TRANSESOPHAGEAL ECHOCARDIOGRAM (TEE);  Surgeon: [name], MD;  Location: [facility] ENDOSCOPY;  Service: Cardiovascular;  Laterality: N/A;  . TOTAL ABDOMINAL HYSTERECTOMY W/ BILATERAL SALPINGOOPHORECTOMY  1997   fibroids  . VULVA / [place] BIOPSY  2015    Outpatient Medications Prior to Visit  Medication Sig Dispense Refill  . acetaminophen (TYLENOL) 500 MG tablet Take 500-750 mg by mouth every 6 (six) hours as needed for pain.    [name] aspirin EC 81 MG EC tablet Take 1 tablet (81 mg total) by mouth daily.    Skipper Cliche SALINE NASAL DROPS NA Place 2 sprays into both nostrils daily.    . Camphor-Eucalyptus-Menthol (VICKS VAPORUB EX) Apply 1 application topically daily.    . clobetasol ointment (TEMOVATE) 4.16 % Apply 1 application topically 2 (two) times daily. Apply to affected area for two weeks then once a  day for 2 weeks. 30 g 2  . docusate sodium (COLACE) 100 MG capsule Take 1 capsule by mouth 2 (two) times daily as needed.  11  . famotidine (PEPCID) 20 MG tablet Take 1 tablet by mouth 2 (two) times daily.  11  . levothyroxine (SYNTHROID, LEVOTHROID) 125 MCG tablet Take 1 tablet (125 mcg total) by mouth daily. 30 tablet 3  . loratadine (CLARITIN) 10 MG tablet Take 20 mg by mouth as needed for allergies.    [name] meclizine (ANTIVERT) 25 MG tablet Take 1 tablet (25 mg total) by mouth 3 (three) times daily as needed for dizziness or nausea. 20 tablet 0  . mycophenolate (MYFORTIC) 360 MG TBEC EC tablet Take 360 mg by mouth 2 (two) times daily.    [name] sulfamethoxazole-trimethoprim (BACTRIM,SEPTRA) 400-80 MG tablet  Take 1 tablet by mouth daily.    . tacrolimus (PROGRAF) 1 MG capsule Take 1 mg by mouth. 4 tablets in the am and 3 tablet in the pm    . triamcinolone (NASACORT ALLERGY 24HR) 55 MCG/ACT AERO nasal inhaler Place 2 sprays into the nose daily.    . verapamil (CALAN-SR) 240 MG CR tablet Take 1 tablet by mouth daily.    . Vitamin D, Ergocalciferol, (DRISDOL) 50000 units CAPS capsule Take 50,000 Units by mouth every 30 (thirty) days.     [name] alendronate (FOSAMAX) 70 MG tablet Take 1 tablet (70 mg total) by mouth every 7 (seven) days. Take with a full glass of water on an empty stomach and remain upright for 30 min after taking this med. (Patient not taking: Reported on 06/14/2016) 4 tablet 11  . valGANciclovir (VALCYTE) 450 MG tablet Take 2 tablets by mouth 2 (two) times daily.     No facility-administered medications prior to visit.     Allergies  Allergen Reactions  . Cefaclor Rash  . Naproxen Rash  . Enalapril Maleate Cough  . Metoprolol Tartrate Rash    On legs    ROS As per HPI  PE: Blood pressure 125/82, pulse 82, temperature 98.2 F (36.8 C), temperature source Temporal, resp. rate 16, weight 208 lb (94.3 kg), SpO2 95 %. VS: noted--normal. Gen: alert, NAD, NONTOXIC APPEARING. HEENT: eyes without injection, drainage, or swelling.  Ears: EACs clear, TMs with normal light reflex and landmarks.  Nose: Clear rhinorrhea, with some dried, crusty exudate adherent to mildly injected mucosa.  No purulent d/c.  No paranasal sinus TTP.  No facial swelling.  Throat and mouth without focal lesion.  No pharyngial swelling, erythema, or exudate.   Neck: supple, no LAD.   LUNGS: CTA bilat, nonlabored resps.   CV: RRR, no m/r/g. EXT: no c/c/e SKIN: no rash  LABS:  None today  IMPRESSION AND PLAN:  URI with cough, no sign of bacterial infection or RAD at this time. She is at higher risk for both viral and bacterial infections due to being on tacrolimus and mycophenolate. Encouraged pt to use her  nasacort and otc saline nasal spray, monitor sx's, and I gave Z-pack to start if sx's have not started to improve in 3d or if starts to worsen before that.  An After Visit Summary was printed and given to the patient.  FOLLOW UP: Return if symptoms worsen or fail to improve.  Signed:  Crissie Sickles, MD           06/14/2016

## 2016-06-20 DIAGNOSIS — Z940 Kidney transplant status: Secondary | ICD-10-CM | POA: Diagnosis not present

## 2016-06-20 DIAGNOSIS — Z79899 Other long term (current) drug therapy: Secondary | ICD-10-CM | POA: Diagnosis not present

## 2016-06-20 DIAGNOSIS — R799 Abnormal finding of blood chemistry, unspecified: Secondary | ICD-10-CM | POA: Diagnosis not present

## 2016-07-04 DIAGNOSIS — Z79899 Other long term (current) drug therapy: Secondary | ICD-10-CM | POA: Diagnosis not present

## 2016-07-04 DIAGNOSIS — Z940 Kidney transplant status: Secondary | ICD-10-CM | POA: Diagnosis not present

## 2016-07-04 DIAGNOSIS — R799 Abnormal finding of blood chemistry, unspecified: Secondary | ICD-10-CM | POA: Diagnosis not present

## 2016-07-11 ENCOUNTER — Encounter: Payer: Self-pay | Admitting: Vascular Surgery

## 2016-07-14 ENCOUNTER — Ambulatory Visit (INDEPENDENT_AMBULATORY_CARE_PROVIDER_SITE_OTHER): Payer: Medicare Other | Admitting: Vascular Surgery

## 2016-07-14 ENCOUNTER — Encounter: Payer: Self-pay | Admitting: Vascular Surgery

## 2016-07-14 VITALS — BP 140/84 | HR 68 | Temp 97.4°F | Resp 18 | Ht 65.0 in | Wt 209.0 lb

## 2016-07-14 DIAGNOSIS — T82898D Other specified complication of vascular prosthetic devices, implants and grafts, subsequent encounter: Secondary | ICD-10-CM | POA: Diagnosis not present

## 2016-07-14 NOTE — Progress Notes (Signed)
History of Present Illness:  Patient is a 70 y.o. year old female who presents for re-evaluation of her left upper arm av fistula.  She is not currently on HD.  She has a working kidney transplant.  She has 2 aneurysms in this fistula.  The is thinning of the skin, but no ulcers and it is not being used.  Other wise she is doing well without changes in her medical history since her last visit 01/07/2016.   Past Medical History:  Diagnosis Date  . Anemia of chronic kidney failure    r/t kidney function- receives Procrit  . Arthritis   . Chronic renal insufficiency, stage III (moderate) 10/2015   Post-transplant Cr 1.3, GFR 41 as of June 2017 f/u w/ [facility]  . CMV infection [facility]) summer 2017   Valcyte per ID/Renal transplant team  . Diverticulosis    a. 05/2012 colonoscopy  . Erosive lichen planus of vulva   . ESRD (end stage renal disease) ([place])    began dialysis 2014--followed by Dr. [name].  Received deceased donor kidney transplant 10/2015.  [name] FSGS (focal segmental glomerulosclerosis)    right kidney; hx of left renal cell cancer and got nephrectomy 1993.  [name] GERD (gastroesophageal reflux disease)   . Gout   . Heart murmur    (Diastolic) ECHO 12/6431 showed that this murmur is coming from pt's R arm AV fistula  . Hiatal hernia   . History of renal cell cancer 1993   Left nephrectomy  . Hyperlipidemia   . Hypertension   . Hypothyroidism   . Impingement syndrome of left shoulder 04/2016   Dr. Barbaraann Barthel  . Osteopenia   . Osteoporosis 06/07/2016   DEXA T-score of -3.1.  Fosamax started 06/2016.  Repeat DEXA 2 yrs.  . Renal transplant recipient 11-17-15  . SVT (supraventricular tachycardia) (HCC)     Past Surgical History:  Procedure Laterality Date  . AV FISTULA PLACEMENT Right    Right arm: aneurismal dilatation 2017 being followed by CV surgeons  . COLONOSCOPY  2003; 05/2012   diverticulosis, no polyps.  Recall 10 yrs  . colonoscopy with polypectomy       Dr Henrene Pastor  . DEXA  06/07/2016   T-score -3.1  . KIDNEY TRANSPLANT  11/17/15   Deceased donor kidney transplant, with Thymoglobulin induction  . NEPHRECTOMY  1993   for malignancy- left  . RENAL BIOPSY     right  . TEE WITHOUT CARDIOVERSION N/A 08/27/2013   Procedure: TRANSESOPHAGEAL ECHOCARDIOGRAM (TEE);  Surgeon: [name], MD;  Location: [facility] ENDOSCOPY;  Service: Cardiovascular;  Laterality: N/A;  . TOTAL ABDOMINAL HYSTERECTOMY W/ BILATERAL SALPINGOOPHORECTOMY  1997   fibroids  . VULVA / PERINEUM BIOPSY  2015     Social History Social History  Substance Use Topics  . Smoking status: Never Smoker  . Smokeless tobacco: Never Used  . Alcohol use Yes     Comment: rarely    Family History Family History  Problem Relation Age of Onset  . Deep vein thrombosis Mother     post thyroid surgery  . Hypertension Mother   . Heart attack Father 58    deceased  . Hypertension Father   . Cancer Sister     breast  . Cancer Paternal Aunt     pancreatic  . Diabetes Maternal Grandfather   . Stroke Paternal Grandmother     in 37s  . Colon cancer Neg Hx   . Esophageal cancer Neg  Hx   . Stomach cancer Neg Hx   . Rectal cancer Neg Hx     Allergies  Allergies  Allergen Reactions  . Cefaclor Rash  . Naproxen Rash  . Enalapril Maleate Cough  . Metoprolol Tartrate Rash    On legs     Current Outpatient Prescriptions  Medication Sig Dispense Refill  . acetaminophen (TYLENOL) 500 MG tablet Take 500-750 mg by mouth every 6 (six) hours as needed for pain.    [name] alendronate (FOSAMAX) 70 MG tablet Take 1 tablet (70 mg total) by mouth every 7 (seven) days. Take with a full glass of water on an empty stomach and remain upright for 30 min after taking this med. (Patient not taking: Reported on 06/14/2016) 4 tablet 11  . aspirin EC 81 MG EC tablet Take 1 tablet (81 mg total) by mouth daily.    Skipper Cliche SALINE NASAL DROPS NA Place 2 sprays into both nostrils daily.    .  Camphor-Eucalyptus-Menthol (VICKS VAPORUB EX) Apply 1 application topically daily.    . clobetasol ointment (TEMOVATE) 1.54 % Apply 1 application topically 2 (two) times daily. Apply to affected area for two weeks then once a day for 2 weeks. 30 g 2  . docusate sodium (COLACE) 100 MG capsule Take 1 capsule by mouth 2 (two) times daily as needed.  11  . famotidine (PEPCID) 20 MG tablet Take 1 tablet by mouth 2 (two) times daily.  11  . levothyroxine (SYNTHROID, LEVOTHROID) 125 MCG tablet Take 1 tablet (125 mcg total) by mouth daily. 30 tablet 3  . loratadine (CLARITIN) 10 MG tablet Take 20 mg by mouth as needed for allergies.    [name] meclizine (ANTIVERT) 25 MG tablet Take 1 tablet (25 mg total) by mouth 3 (three) times daily as needed for dizziness or nausea. 20 tablet 0  . mycophenolate (MYFORTIC) 360 MG TBEC EC tablet Take 360 mg by mouth 2 (two) times daily.    [name] sulfamethoxazole-trimethoprim (BACTRIM,SEPTRA) 400-80 MG tablet Take 1 tablet by mouth daily.    . tacrolimus (PROGRAF) 1 MG capsule Take 1 mg by mouth. 4 tablets in the am and 4 tablet in the pm    . triamcinolone (NASACORT ALLERGY 24HR) 55 MCG/ACT AERO nasal inhaler Place 2 sprays into the nose daily.    . valGANciclovir (VALCYTE) 450 MG tablet Take 2 tablets by mouth 2 (two) times daily.    . verapamil (CALAN-SR) 240 MG CR tablet Take 1 tablet by mouth daily.    . Vitamin D, Ergocalciferol, (DRISDOL) 50000 units CAPS capsule Take 50,000 Units by mouth every 30 (thirty) days.      No current facility-administered medications for this visit.     ROS:   General:  No weight loss, Fever, chills  HEENT: No recent headaches, no nasal bleeding, no visual changes, no sore throat  Neurologic: No dizziness, blackouts, seizures. No recent symptoms of stroke or mini- stroke. No recent episodes of slurred speech, or temporary blindness.  Cardiac: No recent episodes of chest pain/pressure, no shortness of breath at rest.  No shortness of breath  with exertion.  Denies history of atrial fibrillation or irregular heartbeat  Vascular: No history of rest pain in feet.  No history of claudication.  No history of non-healing ulcer, No history of DVT   Pulmonary: No home oxygen, no productive cough, no hemoptysis,  No asthma or wheezing  Musculoskeletal:  [ ]  Arthritis, [ ]  Low back pain,  [ ]  Joint pain  Hematologic:No history of hypercoagulable state.  No history of easy bleeding.  No history of anemia  Gastrointestinal: No hematochezia or melena,  No gastroesophageal reflux, no trouble swallowing  Urinary: [x ] chronic Kidney disease, [ ]  on HD - [ ]  MWF or [ ]  TTHS, [ ]  Burning with urination, [ ]  Frequent urination, [ ]  Difficulty urinating;   Skin: No rashes  Psychological: No history of anxiety,  No history of depression   Physical Examination  Vitals:   07/14/16 1413  BP: 140/84  Pulse: 68  Resp: 18  Temp: 97.4 F (36.3 C)  TempSrc: Oral  SpO2: 96%  Weight: 209 lb (94.8 kg)  Height: 5\' 5"  (1.651 m)    Body mass index is 34.78 kg/m.  General:  Alert and oriented, no acute distress HEENT: Normal Neck: No bruit or JVD Pulmonary: Clear to auscultation bilaterally Cardiac: Regular Rate and Rhythm without murmur Gastrointestinal: Soft, non-tender, non-distended, no mass, no scars Skin: No rash Diffuse aneurysmal dilatation of the proximal 7 cm of a right upper arm AV fistula. The diameter of the fistula is fairly uniform at about 4 cm. There is a skip area followed by an additional aneurysm that again is about 4-5 cm in diameter that extends over about 3 cm. There is a palpable thrill in the fistula.  No change since last visit.  Extremity Pulses:  2+ radial, brachial pulses bilaterally Musculoskeletal: No deformity or edema  Neurologic: Upper and lower extremity motor 5/5 and symmetric  DATA: none   ASSESSMENT:  ESRD not on HD s/p kidney transplant   PLAN: She may perform any daily activities she wishes.   There is no danger of rupture to the fistula.  F/U PRN  Theda Sers, EMMA MAUREEN PA-C [facility]  The patient was seen in conjunction with Dr. Oneida Alar today   History and exam findings as above. Agree with conservative management. The fistula is not at risk of rupture. There is no thickened out skin. It is currently not in use. She will follow-up with [place] on an as-needed basis.  Ruta Hinds, MD [facility] [place] Office: [phone] Pager: [phone]

## 2016-07-17 ENCOUNTER — Encounter: Payer: Self-pay | Admitting: Family Medicine

## 2016-07-26 ENCOUNTER — Encounter: Payer: Self-pay | Admitting: Family Medicine

## 2016-07-26 ENCOUNTER — Ambulatory Visit (INDEPENDENT_AMBULATORY_CARE_PROVIDER_SITE_OTHER): Payer: Medicare Other | Admitting: Family Medicine

## 2016-07-26 ENCOUNTER — Ambulatory Visit: Payer: Medicare Other | Admitting: Family Medicine

## 2016-07-26 VITALS — BP 141/85 | HR 79 | Temp 98.4°F | Resp 16 | Ht 65.0 in | Wt 209.2 lb

## 2016-07-26 DIAGNOSIS — N183 Chronic kidney disease, stage 3 unspecified: Secondary | ICD-10-CM

## 2016-07-26 DIAGNOSIS — Z13220 Encounter for screening for lipoid disorders: Secondary | ICD-10-CM | POA: Diagnosis not present

## 2016-07-26 DIAGNOSIS — I10 Essential (primary) hypertension: Secondary | ICD-10-CM

## 2016-07-26 DIAGNOSIS — E039 Hypothyroidism, unspecified: Secondary | ICD-10-CM

## 2016-07-26 DIAGNOSIS — Z940 Kidney transplant status: Secondary | ICD-10-CM

## 2016-07-26 LAB — LIPID PANEL
CHOLESTEROL: 158 mg/dL (ref 0–200)
HDL: 44.9 mg/dL (ref >39.00)
LDL Cholesterol: 93 mg/dL (ref 0–99)
NONHDL: 112.9
TRIGLYCERIDES: 101 mg/dL (ref 0.0–149.0)
Total CHOL/HDL Ratio: 4
VLDL: 20.2 mg/dL (ref 0.0–40.0)

## 2016-07-26 LAB — TSH: TSH: 2.77 u[IU]/mL (ref 0.35–4.50)

## 2016-07-26 NOTE — Progress Notes (Signed)
Pre visit review using our clinic review tool, if applicable. No additional management support is needed unless otherwise documented below in the visit note. 

## 2016-07-26 NOTE — Progress Notes (Signed)
OFFICE VISIT  07/26/2016   CC:  Chief Complaint  Patient presents with  . Follow-up    RCI, Pt is not fasting.    HPI:    Patient is a 70 y.o. Caucasian female who presents for 3 mo f/u HTN, Hyperlipidemia, hypothyroidism. Three months ago her TSH was still too low so we decreased her levothyroxine dose to 125 mcg qd and she is due for TSH recheck today.    Most recent nephrology f/u with [facility] nephrologist was last week and labs were done and pt has not heard back about these yet.  Cardura was added but she has not picked this up from pharmacy yet.    She had a swig of pepsi today only so I am considering her fasting.    She states that her immunosuppressant dosing has not changed recently.  Currently she is not taking fosamax b/c she is getting some dental/bridge repair work done soon and this med has been known to potentially complicate this type of situation.   Past Medical History:  Diagnosis Date  . Anemia of chronic kidney failure    r/t kidney function- receives Procrit  . Arthritis   . Chronic renal insufficiency, stage III (moderate) 10/2015   Post-transplant Cr 1.3, GFR 41 as of June 2017 f/u w/ [facility]  . CMV infection [facility]) summer 2017   Valcyte per ID/Renal transplant team  . Diverticulosis    a. 05/2012 colonoscopy  . Erosive lichen planus of vulva   . ESRD (end stage renal disease) ([place])    began dialysis 2014--followed by Dr. [name].  Received deceased donor kidney transplant 10/2015.  [name] FSGS (focal segmental glomerulosclerosis)    right kidney; hx of left renal cell cancer and got nephrectomy 1993.  [name] GERD (gastroesophageal reflux disease)   . Gout   . Heart murmur    (Diastolic) ECHO 01/7892 showed that this murmur is coming from pt's R arm AV fistula  . Hemodialysis AV fistula aneurysm ([place]) 2017   R arm; conservative mgmt/watchful waiting approach recommended by vascular surgery---f/u with them prn as of 07/2016 visit.  .  Hiatal hernia   . History of renal cell cancer 1993   Left nephrectomy  . Hyperlipidemia   . Hypertension   . Hypothyroidism   . Impingement syndrome of left shoulder 04/2016   Dr. Barbaraann Barthel  . Osteopenia   . Osteoporosis 06/07/2016   DEXA T-score of -3.1.  Fosamax started 06/2016.  Repeat DEXA 2 yrs.  . Renal transplant recipient 2015/12/06  . SVT (supraventricular tachycardia) (HCC)     Past Surgical History:  Procedure Laterality Date  . AV FISTULA PLACEMENT Right    Right arm: aneurismal dilatation 2017 being followed by CV surgeons  . COLONOSCOPY  2003; 05/2012   diverticulosis, no polyps.  Recall 10 yrs  . colonoscopy with polypectomy     Dr Henrene Pastor  . DEXA  06/07/2016   T-score -3.1  . KIDNEY TRANSPLANT  December 06, 2015   Deceased donor kidney transplant, with Thymoglobulin induction  . NEPHRECTOMY  1993   for malignancy- left  . RENAL BIOPSY     right  . TEE WITHOUT CARDIOVERSION N/A 08/27/2013   Procedure: TRANSESOPHAGEAL ECHOCARDIOGRAM (TEE);  Surgeon: [name], MD;  Location: [facility] ENDOSCOPY;  Service: Cardiovascular;  Laterality: N/A;  . TOTAL ABDOMINAL HYSTERECTOMY W/ BILATERAL SALPINGOOPHORECTOMY  1997   fibroids  . VULVA / [place] BIOPSY  2015    Outpatient Medications Prior to Visit  Medication Sig  Dispense Refill  . acetaminophen (TYLENOL) 500 MG tablet Take 500-750 mg by mouth every 6 (six) hours as needed for pain.    [name] aspirin EC 81 MG EC tablet Take 1 tablet (81 mg total) by mouth daily.    Skipper Cliche SALINE NASAL DROPS NA Place 2 sprays into both nostrils daily.    . Camphor-Eucalyptus-Menthol (VICKS VAPORUB EX) Apply 1 application topically daily.    . clobetasol ointment (TEMOVATE) 9.56 % Apply 1 application topically 2 (two) times daily. Apply to affected area for two weeks then once a day for 2 weeks. 30 g 2  . docusate sodium (COLACE) 100 MG capsule Take 1 capsule by mouth 2 (two) times daily as needed.  11  . famotidine (PEPCID) 20 MG tablet Take 1 tablet by  mouth 2 (two) times daily as needed.   11  . levothyroxine (SYNTHROID, LEVOTHROID) 125 MCG tablet Take 1 tablet (125 mcg total) by mouth daily. 30 tablet 3  . loratadine (CLARITIN) 10 MG tablet Take 20 mg by mouth as needed for allergies.    [name] meclizine (ANTIVERT) 25 MG tablet Take 1 tablet (25 mg total) by mouth 3 (three) times daily as needed for dizziness or nausea. 20 tablet 0  . mycophenolate (MYFORTIC) 360 MG TBEC EC tablet Take 360 mg by mouth 2 (two) times daily.    [name] sulfamethoxazole-trimethoprim (BACTRIM,SEPTRA) 400-80 MG tablet Take 1 tablet by mouth daily.    . tacrolimus (PROGRAF) 1 MG capsule Take 1 mg by mouth. 4 tablets in the am and 4 tablet in the pm    . triamcinolone (NASACORT ALLERGY 24HR) 55 MCG/ACT AERO nasal inhaler Place 2 sprays into the nose daily.    . valGANciclovir (VALCYTE) 450 MG tablet Take 2 tablets by mouth 2 (two) times daily.    . verapamil (CALAN-SR) 240 MG CR tablet Take 1 tablet by mouth daily.    . Vitamin D, Ergocalciferol, (DRISDOL) 50000 units CAPS capsule Take 50,000 Units by mouth every 30 (thirty) days.     [name] alendronate (FOSAMAX) 70 MG tablet Take 1 tablet (70 mg total) by mouth every 7 (seven) days. Take with a full glass of water on an empty stomach and remain upright for 30 min after taking this med. (Patient not taking: Reported on 07/26/2016) 4 tablet 11   No facility-administered medications prior to visit.     Allergies  Allergen Reactions  . Cefaclor Rash  . Naproxen Rash  . Enalapril Maleate Cough  . Metoprolol Tartrate Rash    On legs    ROS As per HPI  PE: Blood pressure (!) 141/85, pulse 79, temperature 98.4 F (36.9 C), temperature source Oral, resp. rate 16, height 5\' 5"  (1.651 m), weight 209 lb 4 oz (94.9 kg), SpO2 94 %. Gen: Alert, well appearing.  Patient is oriented to person, place, time, and situation. AFFECT: pleasant, lucid thought and speech. CV: RRR, 4/6 systolic murmur c/w the sound from her R arm AV fistula.   No rub or gallop. Chest is clear, no wheezing or rales. Normal symmetric air entry throughout both lung fields. No chest wall deformities or tenderness. EXT: no clubbing, cyanosis, or edema.    LABS:  Lab Results  Component Value Date   TSH 0.04 (L) 04/18/2016   Lab Results  Component Value Date   WBC 5.5 09/16/2015   HGB 11.1 (L) 09/16/2015   HCT 33.1 (L) 09/16/2015   MCV 94.8 09/16/2015   PLT 179 09/16/2015   Lab  Results  Component Value Date   CREATININE 3.61 (H) 09/16/2015   BUN 12 09/16/2015   NA 140 09/16/2015   K 3.8 09/16/2015   CL 96 (L) 09/16/2015   CO2 33 (H) 09/16/2015   Lab Results  Component Value Date   ALT 21 12/14/2012   AST 24 12/14/2012   ALKPHOS 71 12/14/2012   BILITOT 0.3 12/14/2012   Lab Results  Component Value Date   CHOL 172 07/16/2015   Lab Results  Component Value Date   HDL 46.60 07/16/2015   Lab Results  Component Value Date   LDLCALC 98 07/16/2015   Lab Results  Component Value Date   TRIG 135.0 07/16/2015   Lab Results  Component Value Date   CHOLHDL 4 07/16/2015   Lab Results  Component Value Date   HGBA1C 5.4 02/23/2012   IMPRESSION AND PLAN:  1) Hypothyroidism: TSH has been low and we've been titrating levothyroxine dose. Recheck TSH today.  2) Hist of hyperlipidemia: currently not on meds.  Check FLP today.  3) HTN: fairly stable, but her nephrologist has added cardura that she'll pick up and start today.  4) s/p renal transplant, with CRI stage III: function has been stable.  Need to get labs from [facility]'s recent f/u visit.  An After Visit Summary was printed and given to the patient.  FOLLOW UP: Return in about 3 months (around 10/24/2016) for routine chronic illness f/u.  Signed:  Crissie Sickles, MD           07/26/2016

## 2016-08-09 ENCOUNTER — Other Ambulatory Visit: Payer: Self-pay | Admitting: Family Medicine

## 2016-08-09 DIAGNOSIS — Z940 Kidney transplant status: Secondary | ICD-10-CM | POA: Diagnosis not present

## 2016-08-09 DIAGNOSIS — Z79899 Other long term (current) drug therapy: Secondary | ICD-10-CM | POA: Diagnosis not present

## 2016-08-09 DIAGNOSIS — R799 Abnormal finding of blood chemistry, unspecified: Secondary | ICD-10-CM | POA: Diagnosis not present

## 2016-08-09 MED ORDER — LEVOTHYROXINE SODIUM 125 MCG PO TABS: 125.0000 ug | ORAL_TABLET | Freq: Every day | ORAL | 11 refills | Status: DC

## 2016-08-09 NOTE — Telephone Encounter (Signed)
RF request for levothyroxine LOV: 07/26/16 Next ov: None Last written: 04/19/16 #30 w/ 3RF 07/26/16 TSH 2.77

## 2016-08-13 ENCOUNTER — Encounter: Payer: Self-pay | Admitting: *Deleted

## 2016-08-13 ENCOUNTER — Emergency Department
Admission: EM | Admit: 2016-08-13 | Discharge: 2016-08-13 | Disposition: A | Discharge status: Home / Self Care | Payer: Medicare Other | Source: Home / Self Care | Attending: Family Medicine | Admitting: Family Medicine

## 2016-08-13 DIAGNOSIS — K0889 Other specified disorders of teeth and supporting structures: Secondary | ICD-10-CM | POA: Diagnosis not present

## 2016-08-13 MED ORDER — AMOXICILLIN 875 MG PO TABS: 875.0000 mg | ORAL_TABLET | Freq: Two times a day (BID) | ORAL | 0 refills | Status: DC

## 2016-08-13 NOTE — ED Provider Notes (Signed)
[name]    CSN: [id number] Arrival date & time: 08/13/16  1436     History   Chief Complaint Chief Complaint  Patient presents with  . Dental Pain    HPI Tabitha Green is a 71 y.o. female.   The history is provided by the patient.  Dental Pain  Location:  Lower Lower teeth location:  19/LL 1st molar Quality:  Aching Severity:  Mild Onset quality:  Gradual Duration:  2 days Timing:  Constant Progression:  Worsening Chronicity:  New Context: cap fell off   Relieved by:  Nothing Worsened by:  Pressure Ineffective treatments:  Topical anesthetic gel Associated symptoms: no difficulty swallowing, no drooling, no facial pain, no facial swelling, no fever, no gum swelling, no headaches, no neck pain, no neck swelling, no oral bleeding, no oral lesions and no trismus     Past Medical History:  Diagnosis Date  . Anemia of chronic kidney failure    r/t kidney function- receives Procrit  . Arthritis   . Chronic renal insufficiency, stage III (moderate) 10/2015   Post-transplant Cr 1.3, GFR 41 as of June 2017 f/u w/ [facility]  . CMV infection [facility]) summer 2017   Valcyte per ID/Renal transplant team  . Diverticulosis    a. 05/2012 colonoscopy  . Erosive lichen planus of vulva   . ESRD (end stage renal disease) ([place])    began dialysis 2014--followed by Dr. [name].  Received deceased donor kidney transplant 10/2015.  [name] FSGS (focal segmental glomerulosclerosis)    right kidney; hx of left renal cell cancer and got nephrectomy 1993.  [name] GERD (gastroesophageal reflux disease)   . Gout   . Heart murmur    (Diastolic) ECHO 09/9796 showed that this murmur is coming from pt's R arm AV fistula  . Hemodialysis AV fistula aneurysm ([place]) 2017   R arm; conservative mgmt/watchful waiting approach recommended by vascular surgery---f/u with them prn as of 07/2016 visit.  . Hiatal hernia   . History of renal cell cancer 1993   Left nephrectomy  .  Hyperlipidemia   . Hypertension   . Hypothyroidism   . Impingement syndrome of left shoulder 04/2016   Dr. Barbaraann Barthel  . Osteopenia   . Osteoporosis 06/07/2016   DEXA T-score of -3.1.  Fosamax started 06/2016.  Repeat DEXA 2 yrs.  . Renal transplant recipient 11/06/15  . SVT (supraventricular tachycardia) [facility])     Patient Active Problem List   Diagnosis Date Noted  . Left shoulder pain 05/02/2016  . Wheeze 09/16/2015  . Cough 09/16/2015  . Acute bronchitis 09/16/2015  . History of renal carcinoma 05/14/2015  . Vulvar ulceration 06/22/2014  . End stage renal disease ([place]) 04/09/2014  . Mechanical complication of other vascular device, implant, and graft 04/09/2014  . Cervical spondylosis 10/10/2013  . Aortic insufficiency 08/20/2013  . Hyperglycemia 12/14/2012  . SVT (supraventricular tachycardia) ([place]) 12/13/2012  . Uremia 12/13/2012  . Chronic kidney disease (CKD), stage IV (severe) ([place]) 12/07/2012  . Bradycardia 12/07/2012  . VITAMIN D DEFICIENCY 09/08/2009  . CHRON GLOMERULONEPHRIT W/LES MEMBRANOUS GLN 09/08/2009  . FATIGUE 09/08/2009  . DIVERTICULOSIS, COLON 09/26/2008  . OSTEOPENIA 09/26/2008  . COLONIC POLYPS, HX OF 09/26/2008  . Hypothyroidism 10/02/2007  . OTHER AND UNSPECIFIED HYPERLIPIDEMIA 10/02/2007  . BENIGN POSITIONAL VERTIGO 10/02/2007  . Unspecified essential hypertension 10/02/2007  . Gout, unspecified 03/26/2007  . DISORDER, DYSMETABOLIC SYNDROME X 92/06/9416  . HX, PERSONAL, MALIGNANCY, KIDNEY 03/26/2007  . OSTEOARTHRITIS 12/26/2006  Past Surgical History:  Procedure Laterality Date  . AV FISTULA PLACEMENT Right    Right arm: aneurismal dilatation 2017 being followed by CV surgeons  . COLONOSCOPY  2003; 05/2012   diverticulosis, no polyps.  Recall 10 yrs  . colonoscopy with polypectomy     Dr Henrene Pastor  . DEXA  06/07/2016   T-score -3.1  . KIDNEY TRANSPLANT  11-10-2015   Deceased donor kidney transplant, with Thymoglobulin induction  . NEPHRECTOMY   1993   for malignancy- left  . RENAL BIOPSY     right  . TEE WITHOUT CARDIOVERSION N/A 08/27/2013   Procedure: TRANSESOPHAGEAL ECHOCARDIOGRAM (TEE);  Surgeon: [name], MD;  Location: [facility] ENDOSCOPY;  Service: Cardiovascular;  Laterality: N/A;  . TOTAL ABDOMINAL HYSTERECTOMY W/ BILATERAL SALPINGOOPHORECTOMY  1997   fibroids  . VULVA / PERINEUM BIOPSY  2015    OB History    Gravida Para Term Preterm AB Living   2 2 2  0 0 2   SAB TAB Ectopic Multiple Live Births   0 0 0 0         Home Medications    Prior to Admission medications   Medication Sig Start Date End Date Taking? Authorizing Provider  acetaminophen (TYLENOL) 500 MG tablet Take 500-750 mg by mouth every 6 (six) hours as needed for pain.    Historical Provider, MD  amoxicillin (AMOXIL) 875 MG tablet Take 1 tablet (875 mg total) by mouth 2 (two) times daily. 08/13/16   Kandra Nicolas, MD  aspirin EC 81 MG EC tablet Take 1 tablet (81 mg total) by mouth daily. 12/09/12   Brooke O Edmisten, PA-C  AYR SALINE NASAL DROPS NA Place 2 sprays into both nostrils daily.    Historical Provider, MD  Camphor-Eucalyptus-Menthol (VICKS VAPORUB EX) Apply 1 application topically daily.    Historical Provider, MD  clobetasol ointment (TEMOVATE) 3.38 % Apply 1 application topically 2 (two) times daily. Apply to affected area for two weeks then once a day for 2 weeks. 06/22/14   Guss Bunde, MD  docusate sodium (COLACE) 100 MG capsule Take 1 capsule by mouth 2 (two) times daily as needed. 01/01/16   Historical Provider, MD  doxazosin (CARDURA) 2 MG tablet Take 1 tablet by mouth 2 (two) times daily. 07/19/16   Historical Provider, MD  famotidine (PEPCID) 20 MG tablet Take 1 tablet by mouth 2 (two) times daily as needed.  12/30/15   Historical Provider, MD  levothyroxine (SYNTHROID, LEVOTHROID) 125 MCG tablet Take 1 tablet (125 mcg total) by mouth daily. 08/09/16   Tammi Sou, MD  loratadine (CLARITIN) 10 MG tablet Take 20 mg by mouth as  needed for allergies.    Historical Provider, MD  meclizine (ANTIVERT) 25 MG tablet Take 1 tablet (25 mg total) by mouth 3 (three) times daily as needed for dizziness or nausea. 07/30/15   Noland Fordyce, PA-C  mycophenolate (MYFORTIC) 360 MG TBEC EC tablet Take 360 mg by mouth 2 (two) times daily.    Historical Provider, MD  sulfamethoxazole-trimethoprim (BACTRIM,SEPTRA) 400-80 MG tablet Take 1 tablet by mouth daily.    Historical Provider, MD  tacrolimus (PROGRAF) 1 MG capsule Take 1 mg by mouth. 4 tablets in the am and 4 tablet in the pm    Historical Provider, MD  triamcinolone (NASACORT ALLERGY 24HR) 55 MCG/ACT AERO nasal inhaler Place 2 sprays into the nose daily.    Historical Provider, MD  valGANciclovir (VALCYTE) 450 MG tablet Take 2 tablets by mouth  2 (two) times daily.    Historical Provider, MD  verapamil (CALAN-SR) 240 MG CR tablet Take 1 tablet by mouth daily. 01/14/16   Historical Provider, MD  Vitamin D, Ergocalciferol, (DRISDOL) 50000 units CAPS capsule Take 50,000 Units by mouth every 30 (thirty) days.     Historical Provider, MD    Family History Family History  Problem Relation Age of Onset  . Deep vein thrombosis Mother     post thyroid surgery  . Hypertension Mother   . Heart attack Father 84    deceased  . Hypertension Father   . Cancer Sister     breast  . Cancer Paternal Aunt     pancreatic  . Diabetes Maternal Grandfather   . Stroke Paternal Grandmother     in 28s  . Colon cancer Neg Hx   . Esophageal cancer Neg Hx   . Stomach cancer Neg Hx   . Rectal cancer Neg Hx     Social History Social History  Substance Use Topics  . Smoking status: Never Smoker  . Smokeless tobacco: Never Used  . Alcohol use Yes     Comment: rarely     Allergies   Cefaclor; Naproxen; Enalapril maleate; and Metoprolol tartrate   Review of Systems Review of Systems  Constitutional: Negative for chills and fever.  HENT: Negative for drooling, facial swelling and mouth  sores.   Musculoskeletal: Negative for neck pain.  Neurological: Negative for headaches.  All other systems reviewed and are negative.    Physical Exam Triage Vital Signs ED Triage Vitals  Enc Vitals Group     BP 08/13/16 1514 153/74     Pulse Rate 08/13/16 1514 85     Resp 08/13/16 1514 14     Temp 08/13/16 1514 97.8 F (36.6 C)     Temp Source 08/13/16 1514 Oral     SpO2 08/13/16 1514 95 %     Weight 08/13/16 1514 212 lb (96.2 kg)     Height --      Head Circumference --      Peak Flow --      Pain Score 08/13/16 1515 4     Pain Loc --      Pain Edu? --      Excl. in [place]? --    No data found.   Updated Vital Signs BP 153/74 (BP Location: Left Arm)   Pulse 85   Temp 97.8 F (36.6 C) (Oral)   Resp 14   Wt 212 lb (96.2 kg)   SpO2 95%   BMI 35.28 kg/m   Visual Acuity Right Eye Distance:   Left Eye Distance:   Bilateral Distance:    Right Eye Near:   Left Eye Near:    Bilateral Near:     Physical Exam  Constitutional: She appears well-developed and well-nourished. No distress.  HENT:  Head: Normocephalic.  Right Ear: External ear normal.  Left Ear: External ear normal.  Nose: Nose normal.  Mouth/Throat: Oropharynx is clear and moist and mucous membranes are normal. No trismus in the jaw.    Tenderness to tap over tooth stub as noted on diagram.  No gingival swelling.  Eyes: Conjunctivae are normal. Pupils are equal, round, and reactive to light.  Neck: Neck supple.  Lymphadenopathy:    She has no cervical adenopathy.  Nursing note and vitals reviewed.    UC Treatments / Results  Labs (all labs ordered are listed, but only abnormal results are displayed) Labs Reviewed -  No data to display  EKG  EKG Interpretation None       Radiology No results found.  Procedures Procedures (including critical care time)  Medications Ordered in UC Medications - No data to display   Initial Impression / Assessment and Plan / UC Course  I have  reviewed the triage vital signs and the nursing notes.  Pertinent labs & imaging results that were available during my care of the patient were reviewed by me and considered in my medical decision making (see chart for details).  Clinical Course   Begin amoxicillin 875mg  BID May take Tylenol for pain.  Try warm salt water gargles. Followup with dentist as soon as possible.      Final Clinical Impressions(s) / UC Diagnoses   Final diagnoses:  Pain, dental    New Prescriptions New Prescriptions   AMOXICILLIN (AMOXIL) 875 MG TABLET    Take 1 tablet (875 mg total) by mouth 2 (two) times daily.     Kandra Nicolas, MD 08/30/16 1455

## 2016-08-13 NOTE — Discharge Instructions (Signed)
May take Tylenol for pain.  Try warm salt water gargles.

## 2016-08-13 NOTE — ED Triage Notes (Addendum)
Patient c/o left lower tooth pain. No drainage or fever. H/o kidney transplant. Taken tylenol and used Oralgel otc.

## 2016-08-22 DIAGNOSIS — R799 Abnormal finding of blood chemistry, unspecified: Secondary | ICD-10-CM | POA: Diagnosis not present

## 2016-08-22 DIAGNOSIS — Z79899 Other long term (current) drug therapy: Secondary | ICD-10-CM | POA: Diagnosis not present

## 2016-08-22 DIAGNOSIS — Z940 Kidney transplant status: Secondary | ICD-10-CM | POA: Diagnosis not present

## 2016-09-06 DIAGNOSIS — Z79899 Other long term (current) drug therapy: Secondary | ICD-10-CM | POA: Diagnosis not present

## 2016-09-06 DIAGNOSIS — B9789 Other viral agents as the cause of diseases classified elsewhere: Secondary | ICD-10-CM | POA: Diagnosis not present

## 2016-09-06 DIAGNOSIS — N250 Renal osteodystrophy: Secondary | ICD-10-CM | POA: Diagnosis not present

## 2016-09-06 DIAGNOSIS — Z940 Kidney transplant status: Secondary | ICD-10-CM | POA: Diagnosis not present

## 2016-09-06 DIAGNOSIS — I10 Essential (primary) hypertension: Secondary | ICD-10-CM | POA: Diagnosis not present

## 2016-09-06 DIAGNOSIS — E872 Acidosis: Secondary | ICD-10-CM | POA: Diagnosis not present

## 2016-09-19 DIAGNOSIS — R799 Abnormal finding of blood chemistry, unspecified: Secondary | ICD-10-CM | POA: Diagnosis not present

## 2016-09-19 DIAGNOSIS — Z940 Kidney transplant status: Secondary | ICD-10-CM | POA: Diagnosis not present

## 2016-09-19 DIAGNOSIS — Z79899 Other long term (current) drug therapy: Secondary | ICD-10-CM | POA: Diagnosis not present

## 2016-10-03 DIAGNOSIS — R799 Abnormal finding of blood chemistry, unspecified: Secondary | ICD-10-CM | POA: Diagnosis not present

## 2016-10-03 DIAGNOSIS — Z79899 Other long term (current) drug therapy: Secondary | ICD-10-CM | POA: Diagnosis not present

## 2016-10-03 DIAGNOSIS — Z940 Kidney transplant status: Secondary | ICD-10-CM | POA: Diagnosis not present

## 2016-10-17 DIAGNOSIS — Z79899 Other long term (current) drug therapy: Secondary | ICD-10-CM | POA: Diagnosis not present

## 2016-10-17 DIAGNOSIS — R799 Abnormal finding of blood chemistry, unspecified: Secondary | ICD-10-CM | POA: Diagnosis not present

## 2016-10-17 DIAGNOSIS — Z940 Kidney transplant status: Secondary | ICD-10-CM | POA: Diagnosis not present

## 2016-10-24 DIAGNOSIS — R799 Abnormal finding of blood chemistry, unspecified: Secondary | ICD-10-CM | POA: Diagnosis not present

## 2016-10-24 DIAGNOSIS — Z940 Kidney transplant status: Secondary | ICD-10-CM | POA: Diagnosis not present

## 2016-10-24 DIAGNOSIS — Z79899 Other long term (current) drug therapy: Secondary | ICD-10-CM | POA: Diagnosis not present

## 2016-11-01 DIAGNOSIS — B259 Cytomegaloviral disease, unspecified: Secondary | ICD-10-CM | POA: Diagnosis not present

## 2016-11-01 DIAGNOSIS — B9789 Other viral agents as the cause of diseases classified elsewhere: Secondary | ICD-10-CM | POA: Diagnosis not present

## 2016-11-01 DIAGNOSIS — I10 Essential (primary) hypertension: Secondary | ICD-10-CM | POA: Diagnosis not present

## 2016-11-01 DIAGNOSIS — M109 Gout, unspecified: Secondary | ICD-10-CM | POA: Diagnosis not present

## 2016-11-01 DIAGNOSIS — E872 Acidosis: Secondary | ICD-10-CM | POA: Diagnosis not present

## 2016-11-01 DIAGNOSIS — Z79899 Other long term (current) drug therapy: Secondary | ICD-10-CM | POA: Diagnosis not present

## 2016-11-01 DIAGNOSIS — Z940 Kidney transplant status: Secondary | ICD-10-CM | POA: Diagnosis not present

## 2016-11-01 DIAGNOSIS — E559 Vitamin D deficiency, unspecified: Secondary | ICD-10-CM | POA: Diagnosis not present

## 2016-11-01 DIAGNOSIS — E785 Hyperlipidemia, unspecified: Secondary | ICD-10-CM | POA: Diagnosis not present

## 2016-11-01 DIAGNOSIS — Z0000 Encounter for general adult medical examination without abnormal findings: Secondary | ICD-10-CM | POA: Diagnosis not present

## 2016-11-07 DIAGNOSIS — R799 Abnormal finding of blood chemistry, unspecified: Secondary | ICD-10-CM | POA: Diagnosis not present

## 2016-11-07 DIAGNOSIS — Z79899 Other long term (current) drug therapy: Secondary | ICD-10-CM | POA: Diagnosis not present

## 2016-11-07 DIAGNOSIS — Z940 Kidney transplant status: Secondary | ICD-10-CM | POA: Diagnosis not present

## 2016-11-18 DIAGNOSIS — L438 Other lichen planus: Secondary | ICD-10-CM | POA: Diagnosis not present

## 2016-11-18 DIAGNOSIS — L570 Actinic keratosis: Secondary | ICD-10-CM | POA: Diagnosis not present

## 2016-11-18 DIAGNOSIS — L821 Other seborrheic keratosis: Secondary | ICD-10-CM | POA: Diagnosis not present

## 2016-11-18 DIAGNOSIS — L304 Erythema intertrigo: Secondary | ICD-10-CM | POA: Diagnosis not present

## 2016-11-21 DIAGNOSIS — Z79899 Other long term (current) drug therapy: Secondary | ICD-10-CM | POA: Diagnosis not present

## 2016-11-21 DIAGNOSIS — R799 Abnormal finding of blood chemistry, unspecified: Secondary | ICD-10-CM | POA: Diagnosis not present

## 2016-11-21 DIAGNOSIS — Z940 Kidney transplant status: Secondary | ICD-10-CM | POA: Diagnosis not present

## 2016-11-23 DIAGNOSIS — L821 Other seborrheic keratosis: Secondary | ICD-10-CM | POA: Insufficient documentation

## 2016-12-02 DIAGNOSIS — H43393 Other vitreous opacities, bilateral: Secondary | ICD-10-CM | POA: Diagnosis not present

## 2016-12-02 DIAGNOSIS — H16223 Keratoconjunctivitis sicca, not specified as Sjogren's, bilateral: Secondary | ICD-10-CM | POA: Diagnosis not present

## 2016-12-05 DIAGNOSIS — Z940 Kidney transplant status: Secondary | ICD-10-CM | POA: Diagnosis not present

## 2016-12-05 DIAGNOSIS — R799 Abnormal finding of blood chemistry, unspecified: Secondary | ICD-10-CM | POA: Diagnosis not present

## 2016-12-05 DIAGNOSIS — Z79899 Other long term (current) drug therapy: Secondary | ICD-10-CM | POA: Diagnosis not present

## 2016-12-20 DIAGNOSIS — Z79899 Other long term (current) drug therapy: Secondary | ICD-10-CM | POA: Diagnosis not present

## 2016-12-20 DIAGNOSIS — R799 Abnormal finding of blood chemistry, unspecified: Secondary | ICD-10-CM | POA: Diagnosis not present

## 2016-12-20 DIAGNOSIS — Z940 Kidney transplant status: Secondary | ICD-10-CM | POA: Diagnosis not present

## 2016-12-30 DIAGNOSIS — Z79899 Other long term (current) drug therapy: Secondary | ICD-10-CM | POA: Diagnosis not present

## 2016-12-30 DIAGNOSIS — Z940 Kidney transplant status: Secondary | ICD-10-CM | POA: Diagnosis not present

## 2016-12-30 DIAGNOSIS — R799 Abnormal finding of blood chemistry, unspecified: Secondary | ICD-10-CM | POA: Diagnosis not present

## 2017-01-16 DIAGNOSIS — Z79899 Other long term (current) drug therapy: Secondary | ICD-10-CM | POA: Diagnosis not present

## 2017-01-16 DIAGNOSIS — Z940 Kidney transplant status: Secondary | ICD-10-CM | POA: Diagnosis not present

## 2017-01-16 DIAGNOSIS — R799 Abnormal finding of blood chemistry, unspecified: Secondary | ICD-10-CM | POA: Diagnosis not present

## 2017-01-19 ENCOUNTER — Encounter: Payer: Self-pay | Admitting: Family Medicine

## 2017-01-19 ENCOUNTER — Ambulatory Visit (INDEPENDENT_AMBULATORY_CARE_PROVIDER_SITE_OTHER): Payer: Medicare Other | Admitting: Family Medicine

## 2017-01-19 VITALS — BP 121/70 | HR 83 | Temp 98.1°F | Resp 16 | Ht 65.0 in | Wt 222.5 lb

## 2017-01-19 DIAGNOSIS — M7542 Impingement syndrome of left shoulder: Secondary | ICD-10-CM

## 2017-01-19 DIAGNOSIS — M7582 Other shoulder lesions, left shoulder: Secondary | ICD-10-CM | POA: Diagnosis not present

## 2017-01-19 DIAGNOSIS — M7522 Bicipital tendinitis, left shoulder: Secondary | ICD-10-CM

## 2017-01-19 MED ORDER — METHYLPREDNISOLONE ACETATE 80 MG/ML IJ SUSP
80.0000 mg | Freq: Once | INTRAMUSCULAR | Status: AC
  Administered 2017-01-19: 80 mg via INTRAMUSCULAR

## 2017-01-19 NOTE — Progress Notes (Signed)
OFFICE VISIT  01/19/2017   CC:  Chief Complaint  Patient presents with  . Arm Pain    upper left arm   HPI:    Patient is a 71 y.o.  female who presents for left upper arm pain. About 1 yr hx of pain in L upper arm where bp cuff goes: hurts w and w/out being touched.  It sometimes extends up into shoulder but not down arm.  No neck pain.  No paresthesias or arm weakness.  Motions with L arm make it worse.  Ambulation/activity does not make it worse.  Uses icy hot and occ tylenol and this helps minimally. Has hx of L RC tendonitis 04/2016 and this got better with PT but has since returned to about like it was before PT. No CP, SOB, jaw pain, diaphoresis, or palpitations.    Past Medical History:  Diagnosis Date  . Anemia of chronic kidney failure    r/t kidney function- receives Procrit  . Arthritis   . Chronic renal insufficiency, stage III (moderate) 10/2015   Post-transplant Cr 1.3, GFR 41 as of June 2017 f/u w/ [facility]  . CMV infection [facility]) summer 2017   Valcyte per ID/Renal transplant team  . Diverticulosis    a. 05/2012 colonoscopy  . Erosive lichen planus of vulva   . ESRD (end stage renal disease) ([place])    began dialysis 2014--followed by Dr. [name].  Received deceased donor kidney transplant 10/2015.  [name] FSGS (focal segmental glomerulosclerosis)    right kidney; hx of left renal cell cancer and got nephrectomy 1993.  [name] GERD (gastroesophageal reflux disease)   . Gout   . Heart murmur    (Diastolic) ECHO 09/3555 showed that this murmur is coming from pt's R arm AV fistula  . Hemodialysis AV fistula aneurysm ([place]) 2017   R arm; conservative mgmt/watchful waiting approach recommended by vascular surgery---f/u with them prn as of 07/2016 visit.  . Hiatal hernia   . History of renal cell cancer 1993   Left nephrectomy  . Hyperlipidemia   . Hypertension   . Hypothyroidism   . Impingement syndrome of left shoulder 04/2016   Dr. Christy Sartorius to PT   . Osteopenia   . Osteoporosis 06/07/2016   DEXA T-score of -3.1.  Fosamax started 06/2016.  Repeat DEXA 2 yrs.  . Renal transplant recipient 11/25/15  . SVT (supraventricular tachycardia) ([facility])     Past Surgical History:  Procedure Laterality Date  . AV FISTULA PLACEMENT Right    Right arm: aneurismal dilatation 2017 being followed by CV surgeons  . COLONOSCOPY  2003; 05/2012   diverticulosis, no polyps.  Recall 10 yrs  . colonoscopy with polypectomy     Dr Henrene Pastor  . DEXA  06/07/2016   T-score -3.1  . KIDNEY TRANSPLANT  Nov 25, 2015   Deceased donor kidney transplant, with Thymoglobulin induction  . NEPHRECTOMY  1993   for malignancy- left  . RENAL BIOPSY     right  . TEE WITHOUT CARDIOVERSION N/A 08/27/2013   Procedure: TRANSESOPHAGEAL ECHOCARDIOGRAM (TEE);  Surgeon: [name], MD;  Location: [facility] ENDOSCOPY;  Service: Cardiovascular;  Laterality: N/A;  . TOTAL ABDOMINAL HYSTERECTOMY W/ BILATERAL SALPINGOOPHORECTOMY  1997   fibroids  . VULVA / [place] BIOPSY  2015    Outpatient Medications Prior to Visit  Medication Sig Dispense Refill  . acetaminophen (TYLENOL) 500 MG tablet Take 500-750 mg by mouth every 6 (six) hours as needed for pain.    [name] aspirin EC 81  MG EC tablet Take 1 tablet (81 mg total) by mouth daily.    Skipper Cliche SALINE NASAL DROPS NA Place 2 sprays into both nostrils daily.    . Camphor-Eucalyptus-Menthol (VICKS VAPORUB EX) Apply 1 application topically daily.    . clobetasol ointment (TEMOVATE) 9.21 % Apply 1 application topically 2 (two) times daily. Apply to affected area for two weeks then once a day for 2 weeks. 30 g 2  . docusate sodium (COLACE) 100 MG capsule Take 1 capsule by mouth 2 (two) times daily as needed.  11  . doxazosin (CARDURA) 2 MG tablet Take 1 tablet by mouth 2 (two) times daily.    . famotidine (PEPCID) 20 MG tablet Take 1 tablet by mouth 2 (two) times daily as needed.   11  . levothyroxine (SYNTHROID, LEVOTHROID) 125 MCG tablet Take 1 tablet  (125 mcg total) by mouth daily. 30 tablet 11  . loratadine (CLARITIN) 10 MG tablet Take 20 mg by mouth as needed for allergies.    [name] meclizine (ANTIVERT) 25 MG tablet Take 1 tablet (25 mg total) by mouth 3 (three) times daily as needed for dizziness or nausea. 20 tablet 0  . mycophenolate (MYFORTIC) 360 MG TBEC EC tablet Take 360 mg by mouth 2 (two) times daily.    [name] sulfamethoxazole-trimethoprim (BACTRIM,SEPTRA) 400-80 MG tablet Take 1 tablet by mouth daily.    . tacrolimus (PROGRAF) 1 MG capsule Take 1 mg by mouth. 4 tablets in the am and 4 tablet in the pm    . triamcinolone (NASACORT ALLERGY 24HR) 55 MCG/ACT AERO nasal inhaler Place 2 sprays into the nose daily.    . valGANciclovir (VALCYTE) 450 MG tablet Take 2 tablets by mouth 2 (two) times daily.    . verapamil (CALAN-SR) 240 MG CR tablet Take 1 tablet by mouth daily.    . Vitamin D, Ergocalciferol, (DRISDOL) 50000 units CAPS capsule Take 50,000 Units by mouth every 30 (thirty) days.     [name] amoxicillin (AMOXIL) 875 MG tablet Take 1 tablet (875 mg total) by mouth 2 (two) times daily. (Patient not taking: Reported on 01/19/2017) 20 tablet 0   No facility-administered medications prior to visit.     Allergies  Allergen Reactions  . Cefaclor Rash  . Naproxen Rash  . Enalapril Maleate Cough  . Metoprolol Tartrate Rash    On legs    ROS As per HPI  PE: Blood pressure 121/70, pulse 83, temperature 98.1 F (36.7 C), temperature source Oral, resp. rate 16, height 5\' 5"  (1.651 m), weight 222 lb 8 oz (100.9 kg), SpO2 94 %. Gen: Alert, well appearing.  Patient is oriented to person, place, time, and situation. AFFECT: pleasant, lucid thought and speech. CV: RRR, bruit from her AV fistula can be heard in diastole.  No r/g.   LUNGS: CTA bilat, nonlabored resps, good aeration in all lung fields. No TTP in neck, ROM of neck intact.  Left shoulder: Mild diffuse TTP over superior aspect of trapezius--esp laterally.  No AC joint or clavicle  tenderness.  +Tenderness around acromion and diffusely along anterolateral upper arm.  Area of most tenderness focally is at bicipital tendon area.  Mildly + speeds, + yergason's.  +Pain with ABduction beginning at about 75 deg.  She can fully abduct L shoulder but it is painful.  Minimal discomfort with resisted ER, no probs with IR.  + Impingment sign.   UE strength 5/5 prox/dist bilat.  Drop arm sign negative.  LABS:  None today  IMPRESSION AND PLAN:  Left RC impingement syndrome with tendonitis.  No sign of tear. She also has signs of bicipital tendonitis and some deltoid strain on L. Recommended PT, which pt agreed to. Also discussed option of subacromial steroid injection and she was agreeable to this today. No NSAIDs. Pt has some leftover tylox from dentist that she will use prn hs to help her alleviate L shoulder pain and rest better   Procedure: Therapeutic LEFT shoulder injection.  The patient's clinical condition is marked by substantial pain and/or significant functional disability.  Other conservative therapy has not provided relief, is contraindicated, or not appropriate.  There is a reasonable likelihood that injection will significantly improve the patient's pain and/or functional disability. Cleaned skin with alcohol swab, used posterolateral approach, Injected 1 ml of 80mg /ml depo medrol + 2 ml of 2% plain lidocaine into subacromial space without resistance.  No immediate complications.  Patient tolerated procedure well.  Post-injection care discussed, including 20 min of icing 1-2 times in the next 4-8 hours, frequent non weight-bearing ROM exercises over the next few days, and general pain medication management.  An After Visit Summary was printed and given to the patient.  FOLLOW UP: Return in about 3 months (around 04/21/2017), or if symptoms worsen or fail to improve in 6 weeks, for annual CPE (fasting) AND AWV with KIM (needs to be after 04/18/17).  Signed:  Crissie Sickles,  MD           01/19/2017

## 2017-01-27 DIAGNOSIS — Z79899 Other long term (current) drug therapy: Secondary | ICD-10-CM | POA: Diagnosis not present

## 2017-01-27 DIAGNOSIS — Z940 Kidney transplant status: Secondary | ICD-10-CM | POA: Diagnosis not present

## 2017-01-27 DIAGNOSIS — R799 Abnormal finding of blood chemistry, unspecified: Secondary | ICD-10-CM | POA: Diagnosis not present

## 2017-02-06 DIAGNOSIS — Z79899 Other long term (current) drug therapy: Secondary | ICD-10-CM | POA: Diagnosis not present

## 2017-02-06 DIAGNOSIS — Z940 Kidney transplant status: Secondary | ICD-10-CM | POA: Diagnosis not present

## 2017-02-06 DIAGNOSIS — R799 Abnormal finding of blood chemistry, unspecified: Secondary | ICD-10-CM | POA: Diagnosis not present

## 2017-02-08 ENCOUNTER — Emergency Department (HOSPITAL_BASED_OUTPATIENT_CLINIC_OR_DEPARTMENT_OTHER)
Admit: 2017-02-08 | Discharge: 2017-02-08 | Disposition: A | Discharge status: Home / Self Care | Payer: Medicare Other | Attending: Emergency Medicine | Admitting: Emergency Medicine

## 2017-02-08 ENCOUNTER — Encounter (HOSPITAL_COMMUNITY): Payer: Self-pay | Admitting: Emergency Medicine

## 2017-02-08 ENCOUNTER — Emergency Department (HOSPITAL_COMMUNITY)
Admission: EM | Admit: 2017-02-08 | Discharge: 2017-02-08 | Disposition: A | Discharge status: Home / Self Care | Payer: Medicare Other | Attending: Emergency Medicine | Admitting: Emergency Medicine

## 2017-02-08 ENCOUNTER — Emergency Department (HOSPITAL_COMMUNITY)
Admission: EM | Admit: 2017-02-08 | Discharge: 2017-02-08 | Disposition: A | Discharge status: Home / Self Care | Payer: Medicare Other | Source: Home / Self Care | Attending: Emergency Medicine | Admitting: Emergency Medicine

## 2017-02-08 ENCOUNTER — Encounter (HOSPITAL_COMMUNITY): Payer: Self-pay

## 2017-02-08 DIAGNOSIS — N186 End stage renal disease: Secondary | ICD-10-CM | POA: Diagnosis not present

## 2017-02-08 DIAGNOSIS — E039 Hypothyroidism, unspecified: Secondary | ICD-10-CM | POA: Diagnosis not present

## 2017-02-08 DIAGNOSIS — Z940 Kidney transplant status: Secondary | ICD-10-CM | POA: Diagnosis not present

## 2017-02-08 DIAGNOSIS — I120 Hypertensive chronic kidney disease with stage 5 chronic kidney disease or end stage renal disease: Secondary | ICD-10-CM | POA: Insufficient documentation

## 2017-02-08 DIAGNOSIS — Z452 Encounter for adjustment and management of vascular access device: Secondary | ICD-10-CM

## 2017-02-08 DIAGNOSIS — M7989 Other specified soft tissue disorders: Secondary | ICD-10-CM | POA: Diagnosis not present

## 2017-02-08 DIAGNOSIS — R2231 Localized swelling, mass and lump, right upper limb: Secondary | ICD-10-CM | POA: Diagnosis present

## 2017-02-08 DIAGNOSIS — T827XXA Infection and inflammatory reaction due to other cardiac and vascular devices, implants and grafts, initial encounter: Secondary | ICD-10-CM | POA: Diagnosis not present

## 2017-02-08 DIAGNOSIS — Y829 Unspecified medical devices associated with adverse incidents: Secondary | ICD-10-CM

## 2017-02-08 DIAGNOSIS — T82898A Other specified complication of vascular prosthetic devices, implants and grafts, initial encounter: Secondary | ICD-10-CM

## 2017-02-08 DIAGNOSIS — T829XXA Unspecified complication of cardiac and vascular prosthetic device, implant and graft, initial encounter: Secondary | ICD-10-CM

## 2017-02-08 DIAGNOSIS — Y828 Other medical devices associated with adverse incidents: Secondary | ICD-10-CM | POA: Diagnosis not present

## 2017-02-08 DIAGNOSIS — Z79899 Other long term (current) drug therapy: Secondary | ICD-10-CM | POA: Insufficient documentation

## 2017-02-08 DIAGNOSIS — T82590A Other mechanical complication of surgically created arteriovenous fistula, initial encounter: Secondary | ICD-10-CM | POA: Diagnosis not present

## 2017-02-08 DIAGNOSIS — Z7982 Long term (current) use of aspirin: Secondary | ICD-10-CM | POA: Insufficient documentation

## 2017-02-08 MED ORDER — CIPROFLOXACIN HCL 500 MG PO TABS: 500.0000 mg | ORAL_TABLET | Freq: Two times a day (BID) | ORAL | 0 refills | Status: DC

## 2017-02-08 NOTE — ED Provider Notes (Signed)
Blood pressure (!) 141/90, pulse 73, temperature 98.8 F (37.1 C), temperature source Oral, resp. rate 18, SpO2 96 %.  Patient arrived from [facility] ED. In short, Tabitha Green is a 71 y.o. female with a chief complaint of Vascular Access Problem .  Refer to the original H&P for additional details.  04:15 PM I reevaluated the patient and examined the right arm. Plan is to have Dr. Scot Dock with vascular surgery see the patient in the emergency department as requested. No active bleeding. The patient is hemodynamically stable. Spoke with Dr. Scot Dock who will evaluate the patient here.   Vascular Ultrasound Right upper extremity venous duplex has been completed.  Preliminary findings:  No evidence suggesting deep or superficial vein thrombosis on today's exam.Three distinct aneurysmal areas noted, labeled palp 1 (most superior), palp 2 (middle) and palp 3 (most inferior and new according to patient).   Preliminary results given to Dr. Vanita Panda @ bedside at 14:45.  Everrett Coombe 02/08/2017, 2:49 PM  Nanda Quinton, MD Emergency Medicine   Chandani Rogowski, [facility], MD 02/08/17 [phone]

## 2017-02-08 NOTE — Progress Notes (Signed)
*  PRELIMINARY RESULTS* Vascular Ultrasound Right upper extremity venous duplex has been completed.  Preliminary findings:  No evidence suggesting deep or superficial vein thrombosis on today's exam.Three distinct aneurysmal areas noted, labeled palp 1 (most superior), palp 2 (middle) and palp 3 (most inferior and new according to patient).   Preliminary results given to Dr. Vanita Panda @ bedside at 14:45.  Everrett Coombe 02/08/2017, 2:49 PM

## 2017-02-08 NOTE — Discharge Instructions (Signed)
Please be sure to proceed directly to the emergency department at [place] staff, and physicians there are aware of your impending arrival.

## 2017-02-08 NOTE — ED Triage Notes (Signed)
Pt reports her old dialysis shunt on her R arm has become more swollen since yesterday. Also seems to be having generalized upper R arm swelling as well. Has not used this shunt in years.

## 2017-02-08 NOTE — ED Provider Notes (Signed)
.   Please see previous physicians note regarding patient's presenting history and physical, initial ED course, and associated medical decision making.  Patient transferred from [facility] ED for evaluation for AV fistula aneurysm. Seen by Dr. Scot Dock in the ED. There is no emergent necessity to repair aneurysms today. He feels this can be done outpatient. His office will arrange close follow-up for patient in the vascular clinic. Patient also wishes to speak to her transplant doctor about it first. Dr. Scot Dock requested discharge with course of ciprofloxacin, which is prescribed. Strict return and follow-up instructions reviewed. She expressed understanding of all discharge instructions and felt comfortable with the plan of care.    Forde Dandy, MD 02/08/17 2007

## 2017-02-08 NOTE — Consult Note (Signed)
Patient name: Tabitha Green MRN: [id number] DOB: Oct 12, 1945 Sex: female   REASON FOR CONSULT:    Aneurysm of right brachiocephalic AV fistula. Consult is requested by the emergency department at [facility].   HPI:   Tabitha Green is a pleasant 71 y.o. female, who had a right brachiocephalic AV fistula placed in [place] in 2012. She began dialysis in 2014. She underwent a renal transplant in [facility] on 11/06/2015. This has been functioning well and she has not been using her fistula. The fistula has been aneurysmal for some time. She was seen in our office and December 2017 and conservative treatment was recommended.  She states that just yesterday she noted that the fistula at the antecubital level appear to be significantly enlarged and there was also some erythema present here. She also feels that the other 2 aneurysmal areas have gradually gotten larger. She was seen in the emergency department at [facility] and they felt that there was potential risk for rupture. Given that vascular surgery is only done here at [facility], I asked that the patient be transferred to the [facility] emergency department.  She denies any significant pain associated with her fistula in the right upper arm. She denies any paresthesias in the right upper arm.  Past Medical History:  Diagnosis Date  . Anemia of chronic kidney failure    r/t kidney function- receives Procrit  . Arthritis   . Chronic renal insufficiency, stage III (moderate) 10/2015   Post-transplant Cr 1.3, GFR 41 as of June 2017 f/u w/ [facility]  . CMV infection [facility]) summer 2017   Valcyte per ID/Renal transplant team  . Diverticulosis    a. 05/2012 colonoscopy  . Erosive lichen planus of vulva   . ESRD (end stage renal disease) ([place])    began dialysis 2014--followed by Dr. [name].  Received deceased donor kidney transplant 10/2015.  Tabitha Green FSGS (focal segmental glomerulosclerosis)    right kidney; hx of left  renal cell cancer and got nephrectomy 1993.  Tabitha Green GERD (gastroesophageal reflux disease)   . Gout   . Heart murmur    (Diastolic) ECHO 0/9983 showed that this murmur is coming from pt's R arm AV fistula  . Hemodialysis AV fistula aneurysm ([place]) 2017   R arm; conservative mgmt/watchful waiting approach recommended by vascular surgery---f/u with them prn as of 07/2016 visit.  . Hiatal hernia   . History of renal cell cancer 1993   Left nephrectomy  . Hyperlipidemia   . Hypertension   . Hypothyroidism   . Impingement syndrome of left shoulder 04/2016   Dr. Christy Sartorius to PT  . Osteopenia   . Osteoporosis 06/07/2016   DEXA T-score of -3.1.  Fosamax started 06/2016.  Repeat DEXA 2 yrs.  . Renal transplant recipient 11/06/15  . SVT (supraventricular tachycardia) (HCC)     Family History  Problem Relation Age of Onset  . Deep vein thrombosis Mother        post thyroid surgery  . Hypertension Mother   . Heart attack Father 74       deceased  . Hypertension Father   . Cancer Sister        breast  . Cancer Paternal Aunt        pancreatic  . Diabetes Maternal Grandfather   . Stroke Paternal Grandmother        in 51s  . Colon cancer Neg Hx   . Esophageal cancer Neg Hx   . Stomach  cancer Neg Hx   . Rectal cancer Neg Hx     SOCIAL HISTORY: Social History   Social History  . Marital status: Married    Spouse name: N/A  . Number of children: 2  . Years of education: N/A   Occupational History  . Retired    Social History Main Topics  . Smoking status: Never Smoker  . Smokeless tobacco: Never Used  . Alcohol use Yes     Comment: rarely  . Drug use: No  . Sexual activity: Not Currently    Birth control/ protection: Surgical   Other Topics Concern  . Not on file   Social History Narrative   Lives in [place] with husband.  Retired.  Previously worked in [employer] @ [employer]   No T/A/Ds.    Allergies  Allergen Reactions  . Cefaclor Rash  . Naproxen Rash    . Enalapril Maleate Cough  . Metoprolol Tartrate Rash    On legs    No current facility-administered medications for this encounter.    Current Outpatient Prescriptions  Medication Sig Dispense Refill  . acetaminophen (TYLENOL) 500 MG tablet Take 500-750 mg by mouth every 6 (six) hours as needed for pain.    Tabitha Green aspirin EC 81 MG EC tablet Take 1 tablet (81 mg total) by mouth daily.    Skipper Cliche SALINE NASAL DROPS NA Place 2 sprays into both nostrils daily.    . Camphor-Eucalyptus-Menthol (VICKS VAPORUB EX) Apply 1 application topically daily.    . clobetasol ointment (TEMOVATE) 8.78 % Apply 1 application topically 2 (two) times daily. Apply to affected area for two weeks then once a day for 2 weeks. 30 g 2  . doxazosin (CARDURA) 2 MG tablet Take 2 mg by mouth 2 (two) times daily.     Tabitha Green levothyroxine (SYNTHROID, LEVOTHROID) 125 MCG tablet Take 1 tablet (125 mcg total) by mouth daily. 30 tablet 11  . mycophenolate (MYFORTIC) 360 MG TBEC EC tablet Take 360 mg by mouth 2 (two) times daily.    Tabitha Green sulfamethoxazole-trimethoprim (BACTRIM,SEPTRA) 400-80 MG tablet Take 1 tablet by mouth daily.    . tacrolimus (PROGRAF) 1 MG capsule Take 1 mg by mouth See admin instructions. 4 mg in the am and 4 mg in the pm    . triamcinolone (NASACORT ALLERGY 24HR) 55 MCG/ACT AERO nasal inhaler Place 2 sprays into the nose daily.    . verapamil (CALAN-SR) 240 MG CR tablet Take 240 mg by mouth daily.     . ciprofloxacin (CIPRO) 500 MG tablet Take 1 tablet (500 mg total) by mouth every 12 (twelve) hours. 14 tablet 0  . loratadine (CLARITIN) 10 MG tablet Take 20 mg by mouth as needed for allergies.    Tabitha Green meclizine (ANTIVERT) 25 MG tablet Take 1 tablet (25 mg total) by mouth 3 (three) times daily as needed for dizziness or nausea. 20 tablet 0    REVIEW OF SYSTEMS:  [X]  denotes positive finding, [ ]  denotes negative finding Cardiac  Comments:  Chest pain or chest pressure:    Shortness of breath upon exertion: X   Short  of breath when lying flat:    Irregular heart rhythm:        Vascular    Pain in calf, thigh, or hip brought on by ambulation:    Pain in feet at night that wakes you up from your sleep:     Blood clot in your veins:    Leg swelling:  Pulmonary    Oxygen at home:    Productive cough:     Wheezing:         Neurologic    Sudden weakness in arms or legs:     Sudden numbness in arms or legs:     Sudden onset of difficulty speaking or slurred speech:    Temporary loss of vision in one eye:     Problems with dizziness:  X       Gastrointestinal    Blood in stool:     Vomited blood:         Genitourinary    Burning when urinating:     Blood in urine:        Psychiatric    Major depression:         Hematologic    Bleeding problems:    Problems with blood clotting too easily:        Skin    Rashes or ulcers:        Constitutional    Fever or chills:     PHYSICAL EXAM:   Vitals:   02/08/17 1603 02/08/17 1715 02/08/17 1722 02/08/17 1724  BP: (!) 141/90   126/65  Pulse: 73 65 69   Resp: 18     Temp: 98.8 F (37.1 C)     TempSrc: Oral     SpO2: 96% 96% 97%     GENERAL: The patient is a well-nourished female, in no acute distress. The vital signs are documented above. CARDIAC: There is a regular rate and rhythm.  VASCULAR: I do not detect carotid bruits. She has palpable radial pulses bilaterally. She has palpable posterior tibial pulses bilaterally. Her fistula has an excellent thrill. Her fistula is dilated throughout. There is no compromise of the skin or eschars noted over her aneurysmal fistula. PULMONARY: There is good air exchange bilaterally without wheezing or rales. ABDOMEN: Soft and non-tender with normal pitched bowel sounds.  MUSCULOSKELETAL: There are no major deformities or cyanosis. NEUROLOGIC: No focal weakness or paresthesias are detected. SKIN: She does have some cellulitis just above the antecubital level. PSYCHIATRIC: The patient has a  normal affect.  DATA:    DUPLEX RIGHT AV FISTULA: I have reviewed the duplex of her right upper arm fistula which was done today. This demonstrated a patent but aneurysmal right upper arm fistula.  MEDICAL ISSUES:   ANEURYSMAL RIGHT UPPER ARM AV FISTULA: This patient has a markedly aneurysmal right upper arm fistula. There is some cellulitis over the proximal fistula and I have recommended that she be placed on antibiotics so that this consult wound before we can consider ligation of her fistula and excision of these large aneurysms. Given the cellulitis would be best awake to this resolves before proceeding with surgery given the risk of infection. I do not think that the fistula is in eminent risk of bleeding as there is really no compromise skin. However the fistula is quite aneurysmal and given that it is no longer being used I think it makes sense to ligate this and address these aneurysms in the next few weeks. She will discuss this with her transplant surgeons in [place] and I will tentatively schedule her for surgery on Tuesday next week. My office will call to make those arrangements.  Deitra Mayo [facility] [phone]

## 2017-02-08 NOTE — Discharge Instructions (Signed)
Dr. Nicole Cella office will be in touch with you to set up appointment for follow-up and to discuss management of your AV fistula aneurysm. Please take antibiotics as prescribed.   Return for worsening symptoms, including escalating pain, fevers, worsening redness/swelling, or any other symptoms concerning to you.

## 2017-02-08 NOTE — ED Notes (Signed)
Pt ambulatory and independent at discharge.  Verbalized understanding of discharge instructions.  Will be going private vehicle to [facility].

## 2017-02-08 NOTE — ED Triage Notes (Addendum)
Pt states she has noticed increase in swelling in her AV fistula in her right arm. Swelling noted, warm to touch. Pt states she no longer uses this fistula, had kidney transplant last year. No acute distress. Pt sent here from [facility] to see on-call vascular.

## 2017-02-08 NOTE — ED Provider Notes (Signed)
[facility] DEPT Provider Note   CSN: [id number] Arrival date & time: 02/08/17  1241     History   Chief Complaint Chief Complaint  Patient presents with  . Arm Swelling    HPI Tabitha Green is a 71 y.o. female.  HPI  Patient presents with concern of swelling, discomfort about the right mid forearm. Patient has a notable history of renal transplant 18 months ago, prior to that she was on dialysis, and swelling is about the distal edge of the right AV fistula. Since onset, about 2 days ago, the patient felt increasing swelling, tenderness about the right distal AV fistula. Pain is sore, pulsatile. No distal loss of sensation, weakness, discoloration, pain. No other new complaints. Patient notes no new medication changes, diet changes.    Past Medical History:  Diagnosis Date  . Anemia of chronic kidney failure    r/t kidney function- receives Procrit  . Arthritis   . Chronic renal insufficiency, stage III (moderate) 10/2015   Post-transplant Cr 1.3, GFR 41 as of June 2017 f/u w/ [facility]  . CMV infection [facility]) summer 2017   Valcyte per ID/Renal transplant team  . Diverticulosis    a. 05/2012 colonoscopy  . Erosive lichen planus of vulva   . ESRD (end stage renal disease) ([place])    began dialysis 2014--followed by Dr. [name].  Received deceased donor kidney transplant 10/2015.  [name] FSGS (focal segmental glomerulosclerosis)    right kidney; hx of left renal cell cancer and got nephrectomy 1993.  [name] GERD (gastroesophageal reflux disease)   . Gout   . Heart murmur    (Diastolic) ECHO 01/2702 showed that this murmur is coming from pt's R arm AV fistula  . Hemodialysis AV fistula aneurysm ([place]) 2017   R arm; conservative mgmt/watchful waiting approach recommended by vascular surgery---f/u with them prn as of 07/2016 visit.  . Hiatal hernia   . History of renal cell cancer 1993   Left nephrectomy  . Hyperlipidemia   . Hypertension   . Hypothyroidism     . Impingement syndrome of left shoulder 04/2016   Dr. Christy Sartorius to PT  . Osteopenia   . Osteoporosis 06/07/2016   DEXA T-score of -3.1.  Fosamax started 06/2016.  Repeat DEXA 2 yrs.  . Renal transplant recipient 11/06/15  . SVT (supraventricular tachycardia) [facility])     Patient Active Problem List   Diagnosis Date Noted  . Left shoulder pain 05/02/2016  . Wheeze 09/16/2015  . Cough 09/16/2015  . Acute bronchitis 09/16/2015  . History of renal carcinoma 05/14/2015  . Vulvar ulceration 06/22/2014  . End stage renal disease ([place]) 04/09/2014  . Mechanical complication of other vascular device, implant, and graft 04/09/2014  . Cervical spondylosis 10/10/2013  . Aortic insufficiency 08/20/2013  . Hyperglycemia 12/14/2012  . SVT (supraventricular tachycardia) ([place]) 12/13/2012  . Uremia 12/13/2012  . Chronic kidney disease (CKD), stage IV (severe) ([place]) 12/07/2012  . Bradycardia 12/07/2012  . VITAMIN D DEFICIENCY 09/08/2009  . CHRON GLOMERULONEPHRIT W/LES MEMBRANOUS GLN 09/08/2009  . FATIGUE 09/08/2009  . DIVERTICULOSIS, COLON 09/26/2008  . OSTEOPENIA 09/26/2008  . COLONIC POLYPS, HX OF 09/26/2008  . Hypothyroidism 10/02/2007  . OTHER AND UNSPECIFIED HYPERLIPIDEMIA 10/02/2007  . BENIGN POSITIONAL VERTIGO 10/02/2007  . Unspecified essential hypertension 10/02/2007  . Gout, unspecified 03/26/2007  . DISORDER, DYSMETABOLIC SYNDROME X 50/04/3817  . HX, PERSONAL, MALIGNANCY, KIDNEY 03/26/2007  . OSTEOARTHRITIS 12/26/2006    Past Surgical History:  Procedure Laterality Date  . AV FISTULA  PLACEMENT Right    Right arm: aneurismal dilatation 2017 being followed by CV surgeons  . COLONOSCOPY  2003; 05/2012   diverticulosis, no polyps.  Recall 10 yrs  . colonoscopy with polypectomy     Dr Henrene Pastor  . DEXA  06/07/2016   T-score -3.1  . KIDNEY TRANSPLANT  12-03-15   Deceased donor kidney transplant, with Thymoglobulin induction  . NEPHRECTOMY  1993   for malignancy- left  .  RENAL BIOPSY     right  . TEE WITHOUT CARDIOVERSION N/A 08/27/2013   Procedure: TRANSESOPHAGEAL ECHOCARDIOGRAM (TEE);  Surgeon: [name], MD;  [facility] ENDOSCOPY;  Service: Cardiovascular;  Laterality: N/A;  . TOTAL ABDOMINAL HYSTERECTOMY W/ BILATERAL SALPINGOOPHORECTOMY  1997   fibroids  . VULVA / PERINEUM BIOPSY  2015    OB History    Gravida Para Term Preterm AB Living   2 2 2  0 0 2   SAB TAB Ectopic Multiple Live Births   0 0 0 0         Home Medications    Prior to Admission medications   Medication Sig Start Date End Date Taking? Authorizing Provider  acetaminophen (TYLENOL) 500 MG tablet Take 500-750 mg by mouth every 6 (six) hours as needed for pain.    [provider]  aspirin EC 81 MG EC tablet Take 1 tablet (81 mg total) by mouth daily. 12/09/12   Edmisten, Brooke O, PA-C  AYR SALINE NASAL DROPS NA Place 2 sprays into both nostrils daily.    [provider]  Camphor-Eucalyptus-Menthol (VICKS VAPORUB EX) Apply 1 application topically daily.    [provider]  clobetasol ointment (TEMOVATE) 3.42 % Apply 1 application topically 2 (two) times daily. Apply to affected area for two weeks then once a day for 2 weeks. 06/22/14   Guss Bunde, MD  docusate sodium (COLACE) 100 MG capsule Take 1 capsule by mouth 2 (two) times daily as needed. 01/01/16   [provider]  doxazosin (CARDURA) 2 MG tablet Take 1 tablet by mouth 2 (two) times daily. 07/19/16   [provider]  famotidine (PEPCID) 20 MG tablet Take 1 tablet by mouth 2 (two) times daily as needed.  12/30/15   [provider]  levothyroxine (SYNTHROID, LEVOTHROID) 125 MCG tablet Take 1 tablet (125 mcg total) by mouth daily. 08/09/16   McGowen, Adrian Blackwater, MD  loratadine (CLARITIN) 10 MG tablet Take 20 mg by mouth as needed for allergies.    [provider]  meclizine (ANTIVERT) 25 MG tablet Take 1 tablet (25 mg total) by mouth 3 (three) times daily as needed  for dizziness or nausea. 07/30/15   Noe Gens, PA-C  mycophenolate (MYFORTIC) 360 MG TBEC EC tablet Take 360 mg by mouth 2 (two) times daily.    [provider]  sulfamethoxazole-trimethoprim (BACTRIM,SEPTRA) 400-80 MG tablet Take 1 tablet by mouth daily.    [provider]  tacrolimus (PROGRAF) 1 MG capsule Take 1 mg by mouth. 4 tablets in the am and 4 tablet in the pm    [provider]  triamcinolone (NASACORT ALLERGY 24HR) 55 MCG/ACT AERO nasal inhaler Place 2 sprays into the nose daily.    [provider]  valGANciclovir (VALCYTE) 450 MG tablet Take 2 tablets by mouth 2 (two) times daily.    [provider]  verapamil (CALAN-SR) 240 MG CR tablet Take 1 tablet by mouth daily. 01/14/16   [provider]  Vitamin D, Ergocalciferol, (DRISDOL) 50000  units CAPS capsule Take 50,000 Units by mouth every 30 (thirty) days.     [provider]    Family History Family History  Problem Relation Age of Onset  . Deep vein thrombosis Mother        post thyroid surgery  . Hypertension Mother   . Heart attack Father 84       deceased  . Hypertension Father   . Cancer Sister        breast  . Cancer Paternal Aunt        pancreatic  . Diabetes Maternal Grandfather   . Stroke Paternal Grandmother        in 63s  . Colon cancer Neg Hx   . Esophageal cancer Neg Hx   . Stomach cancer Neg Hx   . Rectal cancer Neg Hx     Social History Social History  Substance Use Topics  . Smoking status: Never Smoker  . Smokeless tobacco: Never Used  . Alcohol use Yes     Comment: rarely     Allergies   Cefaclor; Naproxen; Enalapril maleate; and Metoprolol tartrate   Review of Systems Review of Systems  Constitutional:       Per HPI, otherwise negative  HENT:       Per HPI, otherwise negative  Respiratory:       Per HPI, otherwise negative  Cardiovascular:       Per HPI, otherwise negative  Gastrointestinal: Negative for vomiting.    Endocrine:       Negative aside from HPI  Genitourinary:       Neg aside from HPI   Musculoskeletal:       Per HPI, otherwise negative  Skin: Negative.   Allergic/Immunologic:       Renal transplant  Neurological: Negative for syncope.     Physical Exam Updated Vital Signs BP (!) 150/77   Pulse 73   Temp 97.8 F (36.6 C) (Oral)   Resp 16   SpO2 97%   Physical Exam  Constitutional: She is oriented to person, place, and time. She appears well-developed and well-nourished. No distress.  HENT:  Head: Normocephalic and atraumatic.  Eyes: Conjunctivae and EOM are normal.  Cardiovascular: Normal rate and regular rhythm.   R AV fistula engorged, w palpable thrill and pulsatile edges.  Distal pulses are appropriate.  Pulmonary/Chest: Effort normal and breath sounds normal. No stridor. No respiratory distress.  Abdominal: She exhibits no distension.  Musculoskeletal: She exhibits no edema.  Neurological: She is alert and oriented to person, place, and time. No cranial nerve deficit.  Skin: Skin is warm and dry.  Psychiatric: She has a normal mood and affect.  Nursing note and vitals reviewed.    ED Treatments / Results  Per Vacular Notes 12/16: Diffuse aneurysmal dilatation of the proximal 7 cm of a right upper arm AV fistula. The diameter of the fistula is fairly uniform at about 4 cm. There is a skip area followed by an additional aneurysm that again is about 4-5 cm in diameter that extends over about 3 cm. There is a palpable thrill in the fistula.  No change since last visit.   Radiology I discussed the patient's ultrasound results with our sonographer, and with concern for new discrete lesion, subsequently discussed his findings with our vascular surgeon on-call, then arrangements for transfer to our affiliated emergency department, for his evaluation of the patient.  Procedures Procedures (including critical care time)   Initial Impression / Assessment and Plan /  ED  Course  I have reviewed the triage vital signs and the nursing notes.  Pertinent labs & imaging results that were available during my care of the patient were reviewed by me and considered in my medical decision making (see chart for details).   Final Clinical Impressions(s) / ED Diagnoses   Final diagnoses:  Complication of AV dialysis fistula, initial encounter     Carmin Muskrat, MD 02/08/17 1534

## 2017-02-08 NOTE — ED Notes (Signed)
Vascular at bedside

## 2017-02-09 ENCOUNTER — Other Ambulatory Visit: Payer: Self-pay

## 2017-02-10 ENCOUNTER — Encounter (HOSPITAL_COMMUNITY): Payer: Self-pay | Admitting: *Deleted

## 2017-02-10 NOTE — Progress Notes (Signed)
Spoke with pt for pre-op call. Pt denies cardiac history except for SVT one time. Pt states she is not diabetic. Have requested EKG and cardiac studies from [place] in [place]. Pt has had a kidney transplant.

## 2017-02-13 DIAGNOSIS — E872 Acidosis: Secondary | ICD-10-CM | POA: Diagnosis not present

## 2017-02-13 DIAGNOSIS — I10 Essential (primary) hypertension: Secondary | ICD-10-CM | POA: Diagnosis not present

## 2017-02-13 DIAGNOSIS — M109 Gout, unspecified: Secondary | ICD-10-CM | POA: Diagnosis not present

## 2017-02-13 DIAGNOSIS — Z79899 Other long term (current) drug therapy: Secondary | ICD-10-CM | POA: Diagnosis not present

## 2017-02-13 DIAGNOSIS — Z940 Kidney transplant status: Secondary | ICD-10-CM | POA: Diagnosis not present

## 2017-02-13 MED ORDER — SODIUM CHLORIDE 0.9 % IV SOLN
1500.0000 mg | INTRAVENOUS | Status: AC
  Administered 2017-02-14: 1500 mg via INTRAVENOUS
  Filled 2017-02-13: qty 1500

## 2017-02-14 ENCOUNTER — Encounter (HOSPITAL_COMMUNITY)
Admission: RE | Disposition: A | Discharge status: Home / Self Care | Payer: Self-pay | Source: Ambulatory Visit | Attending: Vascular Surgery

## 2017-02-14 ENCOUNTER — Telehealth: Payer: Self-pay | Admitting: Vascular Surgery

## 2017-02-14 ENCOUNTER — Ambulatory Visit (HOSPITAL_COMMUNITY): Payer: Medicare Other | Admitting: Anesthesiology

## 2017-02-14 ENCOUNTER — Ambulatory Visit (HOSPITAL_COMMUNITY)
Admission: RE | Admit: 2017-02-14 | Discharge: 2017-02-14 | Disposition: A | Discharge status: Home / Self Care | Payer: Medicare Other | Source: Ambulatory Visit | Attending: Vascular Surgery | Admitting: Vascular Surgery

## 2017-02-14 DIAGNOSIS — E785 Hyperlipidemia, unspecified: Secondary | ICD-10-CM | POA: Insufficient documentation

## 2017-02-14 DIAGNOSIS — Z940 Kidney transplant status: Secondary | ICD-10-CM | POA: Diagnosis not present

## 2017-02-14 DIAGNOSIS — E039 Hypothyroidism, unspecified: Secondary | ICD-10-CM | POA: Insufficient documentation

## 2017-02-14 DIAGNOSIS — M810 Age-related osteoporosis without current pathological fracture: Secondary | ICD-10-CM | POA: Insufficient documentation

## 2017-02-14 DIAGNOSIS — M109 Gout, unspecified: Secondary | ICD-10-CM | POA: Insufficient documentation

## 2017-02-14 DIAGNOSIS — Z833 Family history of diabetes mellitus: Secondary | ICD-10-CM | POA: Diagnosis not present

## 2017-02-14 DIAGNOSIS — Z905 Acquired absence of kidney: Secondary | ICD-10-CM | POA: Insufficient documentation

## 2017-02-14 DIAGNOSIS — Y939 Activity, unspecified: Secondary | ICD-10-CM | POA: Insufficient documentation

## 2017-02-14 DIAGNOSIS — Z8249 Family history of ischemic heart disease and other diseases of the circulatory system: Secondary | ICD-10-CM | POA: Insufficient documentation

## 2017-02-14 DIAGNOSIS — I728 Aneurysm of other specified arteries: Secondary | ICD-10-CM | POA: Diagnosis not present

## 2017-02-14 DIAGNOSIS — Z7982 Long term (current) use of aspirin: Secondary | ICD-10-CM | POA: Insufficient documentation

## 2017-02-14 DIAGNOSIS — T82898A Other specified complication of vascular prosthetic devices, implants and grafts, initial encounter: Secondary | ICD-10-CM | POA: Diagnosis not present

## 2017-02-14 DIAGNOSIS — K5790 Diverticulosis of intestine, part unspecified, without perforation or abscess without bleeding: Secondary | ICD-10-CM | POA: Insufficient documentation

## 2017-02-14 DIAGNOSIS — Z85528 Personal history of other malignant neoplasm of kidney: Secondary | ICD-10-CM | POA: Insufficient documentation

## 2017-02-14 DIAGNOSIS — I120 Hypertensive chronic kidney disease with stage 5 chronic kidney disease or end stage renal disease: Secondary | ICD-10-CM | POA: Diagnosis not present

## 2017-02-14 DIAGNOSIS — M8580 Other specified disorders of bone density and structure, unspecified site: Secondary | ICD-10-CM | POA: Insufficient documentation

## 2017-02-14 DIAGNOSIS — N186 End stage renal disease: Secondary | ICD-10-CM | POA: Diagnosis not present

## 2017-02-14 DIAGNOSIS — Z803 Family history of malignant neoplasm of breast: Secondary | ICD-10-CM | POA: Diagnosis not present

## 2017-02-14 DIAGNOSIS — K449 Diaphragmatic hernia without obstruction or gangrene: Secondary | ICD-10-CM | POA: Diagnosis not present

## 2017-02-14 DIAGNOSIS — R011 Cardiac murmur, unspecified: Secondary | ICD-10-CM | POA: Diagnosis not present

## 2017-02-14 DIAGNOSIS — M7542 Impingement syndrome of left shoulder: Secondary | ICD-10-CM | POA: Insufficient documentation

## 2017-02-14 DIAGNOSIS — D649 Anemia, unspecified: Secondary | ICD-10-CM | POA: Insufficient documentation

## 2017-02-14 DIAGNOSIS — X58XXXA Exposure to other specified factors, initial encounter: Secondary | ICD-10-CM | POA: Insufficient documentation

## 2017-02-14 DIAGNOSIS — Z800 Family history of malignant neoplasm of digestive organs: Secondary | ICD-10-CM | POA: Diagnosis not present

## 2017-02-14 DIAGNOSIS — Z823 Family history of stroke: Secondary | ICD-10-CM | POA: Diagnosis not present

## 2017-02-14 DIAGNOSIS — Z79899 Other long term (current) drug therapy: Secondary | ICD-10-CM | POA: Insufficient documentation

## 2017-02-14 DIAGNOSIS — Z888 Allergy status to other drugs, medicaments and biological substances status: Secondary | ICD-10-CM | POA: Insufficient documentation

## 2017-02-14 DIAGNOSIS — I471 Supraventricular tachycardia: Secondary | ICD-10-CM | POA: Insufficient documentation

## 2017-02-14 DIAGNOSIS — K219 Gastro-esophageal reflux disease without esophagitis: Secondary | ICD-10-CM | POA: Insufficient documentation

## 2017-02-14 DIAGNOSIS — E1122 Type 2 diabetes mellitus with diabetic chronic kidney disease: Secondary | ICD-10-CM | POA: Diagnosis not present

## 2017-02-14 HISTORY — PX: LIGATION OF ARTERIOVENOUS  FISTULA: SHX5948

## 2017-02-14 HISTORY — PX: RESECTION OF ARTERIOVENOUS FISTULA ANEURYSM: SHX6070

## 2017-02-14 HISTORY — DX: Anxiety disorder, unspecified: F41.9

## 2017-02-14 LAB — POCT I-STAT 4, (NA,K, GLUC, HGB,HCT)
Glucose, Bld: 99 mg/dL (ref 65–99)
HEMATOCRIT: 37 % (ref 36.0–46.0)
Hemoglobin: 12.6 g/dL (ref 12.0–15.0)
POTASSIUM: 3.9 mmol/L (ref 3.5–5.1)
SODIUM: 142 mmol/L (ref 135–145)

## 2017-02-14 SURGERY — LIGATION OF ARTERIOVENOUS  FISTULA
Anesthesia: General | Site: Arm Upper | Laterality: Right

## 2017-02-14 MED ORDER — FENTANYL CITRATE (PF) 100 MCG/2ML IJ SOLN: 25.0000 ug | INTRAMUSCULAR | Status: DC | PRN

## 2017-02-14 MED ORDER — LIDOCAINE-EPINEPHRINE (PF) 1 %-1:200000 IJ SOLN
INTRAMUSCULAR | Status: DC | PRN
  Administered 2017-02-14: 15 mL

## 2017-02-14 MED ORDER — FENTANYL CITRATE (PF) 250 MCG/5ML IJ SOLN
INTRAMUSCULAR | Status: AC
  Filled 2017-02-14: qty 5

## 2017-02-14 MED ORDER — DEXAMETHASONE SODIUM PHOSPHATE 10 MG/ML IJ SOLN
INTRAMUSCULAR | Status: DC | PRN
  Administered 2017-02-14: 10 mg via INTRAVENOUS

## 2017-02-14 MED ORDER — EPHEDRINE 5 MG/ML INJ
INTRAVENOUS | Status: AC
  Filled 2017-02-14: qty 10

## 2017-02-14 MED ORDER — LIDOCAINE HCL (CARDIAC) 20 MG/ML IV SOLN
INTRAVENOUS | Status: AC
  Filled 2017-02-14: qty 5

## 2017-02-14 MED ORDER — 0.9 % SODIUM CHLORIDE (POUR BTL) OPTIME
TOPICAL | Status: DC | PRN
  Administered 2017-02-14: 1000 mL

## 2017-02-14 MED ORDER — MIDAZOLAM HCL 2 MG/2ML IJ SOLN
INTRAMUSCULAR | Status: AC
  Filled 2017-02-14: qty 2

## 2017-02-14 MED ORDER — MIDAZOLAM HCL 5 MG/5ML IJ SOLN
INTRAMUSCULAR | Status: DC | PRN
  Administered 2017-02-14: 1 mg via INTRAVENOUS

## 2017-02-14 MED ORDER — PROPOFOL 10 MG/ML IV BOLUS
INTRAVENOUS | Status: AC
  Filled 2017-02-14: qty 20

## 2017-02-14 MED ORDER — DEXAMETHASONE SODIUM PHOSPHATE 10 MG/ML IJ SOLN
INTRAMUSCULAR | Status: AC
  Filled 2017-02-14: qty 1

## 2017-02-14 MED ORDER — PROPOFOL 10 MG/ML IV BOLUS
INTRAVENOUS | Status: DC | PRN
  Administered 2017-02-14: 150 mg via INTRAVENOUS

## 2017-02-14 MED ORDER — PROMETHAZINE HCL 25 MG/ML IJ SOLN
INTRAMUSCULAR | Status: AC
  Filled 2017-02-14: qty 1

## 2017-02-14 MED ORDER — EPHEDRINE SULFATE 50 MG/ML IJ SOLN
INTRAMUSCULAR | Status: DC | PRN
  Administered 2017-02-14 (×4): 10 mg via INTRAVENOUS

## 2017-02-14 MED ORDER — LIDOCAINE HCL (CARDIAC) 20 MG/ML IV SOLN
INTRAVENOUS | Status: DC | PRN
  Administered 2017-02-14: 60 mg via INTRAVENOUS

## 2017-02-14 MED ORDER — ONDANSETRON HCL 4 MG/2ML IJ SOLN
INTRAMUSCULAR | Status: DC | PRN
  Administered 2017-02-14: 4 mg via INTRAVENOUS

## 2017-02-14 MED ORDER — FENTANYL CITRATE (PF) 100 MCG/2ML IJ SOLN
INTRAMUSCULAR | Status: DC | PRN
  Administered 2017-02-14 (×3): 50 ug via INTRAVENOUS

## 2017-02-14 MED ORDER — THROMBIN 20000 UNITS EX SOLR
CUTANEOUS | Status: AC
  Filled 2017-02-14: qty 20000

## 2017-02-14 MED ORDER — SODIUM CHLORIDE 0.9 % IV SOLN
INTRAVENOUS | Status: DC | PRN
  Administered 2017-02-14: 500 mL

## 2017-02-14 MED ORDER — PHENYLEPHRINE HCL 10 MG/ML IJ SOLN
INTRAVENOUS | Status: DC | PRN
  Administered 2017-02-14: 20 ug/min via INTRAVENOUS

## 2017-02-14 MED ORDER — LIDOCAINE-EPINEPHRINE (PF) 1 %-1:200000 IJ SOLN
INTRAMUSCULAR | Status: AC
  Filled 2017-02-14: qty 30

## 2017-02-14 MED ORDER — PHENYLEPHRINE 40 MCG/ML (10ML) SYRINGE FOR IV PUSH (FOR BLOOD PRESSURE SUPPORT)
PREFILLED_SYRINGE | INTRAVENOUS | Status: AC
  Filled 2017-02-14: qty 10

## 2017-02-14 MED ORDER — PHENYLEPHRINE HCL 10 MG/ML IJ SOLN
INTRAMUSCULAR | Status: DC | PRN
  Administered 2017-02-14 (×2): 80 ug via INTRAVENOUS

## 2017-02-14 MED ORDER — ONDANSETRON HCL 4 MG/2ML IJ SOLN
INTRAMUSCULAR | Status: AC
  Filled 2017-02-14: qty 2

## 2017-02-14 MED ORDER — LIDOCAINE HCL (PF) 1 % IJ SOLN
INTRAMUSCULAR | Status: AC
  Filled 2017-02-14: qty 30

## 2017-02-14 MED ORDER — SODIUM CHLORIDE 0.9 % IV SOLN
INTRAVENOUS | Status: DC
  Administered 2017-02-14: 07:00:00 via INTRAVENOUS

## 2017-02-14 MED ORDER — CHLORHEXIDINE GLUCONATE CLOTH 2 % EX PADS: 6.0000 | MEDICATED_PAD | Freq: Once | CUTANEOUS | Status: DC

## 2017-02-14 MED ORDER — PROMETHAZINE HCL 25 MG/ML IJ SOLN
6.2500 mg | INTRAMUSCULAR | Status: DC | PRN
  Administered 2017-02-14: 6.25 mg via INTRAVENOUS

## 2017-02-14 MED ORDER — OXYCODONE-ACETAMINOPHEN 5-325 MG PO TABS: 1.0000 | ORAL_TABLET | ORAL | 0 refills | Status: DC | PRN

## 2017-02-14 SURGICAL SUPPLY — 45 items
ARMBAND PINK RESTRICT EXTREMIT (MISCELLANEOUS) ×2 IMPLANT
BANDAGE ACE 4X5 VEL STRL LF (GAUZE/BANDAGES/DRESSINGS) ×2 IMPLANT
BNDG GAUZE ELAST 4 BULKY (GAUZE/BANDAGES/DRESSINGS) ×2 IMPLANT
CANISTER SUCT 3000ML PPV (MISCELLANEOUS) ×2 IMPLANT
CANNULA VESSEL 3MM 2 BLNT TIP (CANNULA) IMPLANT
CLIP TI MEDIUM 6 (CLIP) ×2 IMPLANT
CLIP TI WIDE RED SMALL 6 (CLIP) ×2 IMPLANT
COVER PROBE W GEL 5X96 (DRAPES) IMPLANT
DECANTER SPIKE VIAL GLASS SM (MISCELLANEOUS) ×2 IMPLANT
DERMABOND ADVANCED (GAUZE/BANDAGES/DRESSINGS) ×2
DERMABOND ADVANCED .7 DNX12 (GAUZE/BANDAGES/DRESSINGS) ×2 IMPLANT
DRAIN PENROSE 1/2X12 LTX STRL (WOUND CARE) IMPLANT
ELECT REM PT RETURN 9FT ADLT (ELECTROSURGICAL) ×2
ELECTRODE REM PT RTRN 9FT ADLT (ELECTROSURGICAL) ×1 IMPLANT
GAUZE SPONGE 4X4 12PLY STRL (GAUZE/BANDAGES/DRESSINGS) ×2 IMPLANT
GAUZE SPONGE 4X4 16PLY XRAY LF (GAUZE/BANDAGES/DRESSINGS) ×2 IMPLANT
GLOVE BIO SURGEON STRL SZ 6 (GLOVE) ×4 IMPLANT
GLOVE BIO SURGEON STRL SZ7.5 (GLOVE) ×2 IMPLANT
GLOVE BIOGEL PI IND STRL 6.5 (GLOVE) ×1 IMPLANT
GLOVE BIOGEL PI IND STRL 7.5 (GLOVE) ×1 IMPLANT
GLOVE BIOGEL PI IND STRL 8 (GLOVE) ×2 IMPLANT
GLOVE BIOGEL PI INDICATOR 6.5 (GLOVE) ×1
GLOVE BIOGEL PI INDICATOR 7.5 (GLOVE) ×1
GLOVE BIOGEL PI INDICATOR 8 (GLOVE) ×2
GLOVE ECLIPSE 7.0 STRL STRAW (GLOVE) ×2 IMPLANT
GLOVE SS N UNI LF 6.0 STRL (GLOVE) ×2 IMPLANT
GOWN STRL REUS W/ TWL LRG LVL3 (GOWN DISPOSABLE) ×3 IMPLANT
GOWN STRL REUS W/TWL LRG LVL3 (GOWN DISPOSABLE) ×3
KIT BASIN OR (CUSTOM PROCEDURE TRAY) ×2 IMPLANT
KIT ROOM TURNOVER OR (KITS) ×2 IMPLANT
NS IRRIG 1000ML POUR BTL (IV SOLUTION) ×2 IMPLANT
PACK CV ACCESS (CUSTOM PROCEDURE TRAY) ×2 IMPLANT
PAD ARMBOARD 7.5X6 YLW CONV (MISCELLANEOUS) ×4 IMPLANT
SPONGE SURGIFOAM ABS GEL 100 (HEMOSTASIS) IMPLANT
SUT ETHILON 3 0 PS 1 (SUTURE) IMPLANT
SUT PROLENE 5 0 C 1 24 (SUTURE) ×6 IMPLANT
SUT PROLENE 6 0 BV (SUTURE) ×2 IMPLANT
SUT SILK 0 TIES 10X30 (SUTURE) ×2 IMPLANT
SUT VIC AB 3-0 SH 27 (SUTURE) ×2
SUT VIC AB 3-0 SH 27X BRD (SUTURE) ×2 IMPLANT
SUT VICRYL 4-0 PS2 18IN ABS (SUTURE) ×4 IMPLANT
SWAB COLLECTION DEVICE MRSA (MISCELLANEOUS) IMPLANT
SWAB CULTURE ESWAB REG 1ML (MISCELLANEOUS) IMPLANT
UNDERPAD 30X30 (UNDERPADS AND DIAPERS) ×2 IMPLANT
WATER STERILE IRR 1000ML POUR (IV SOLUTION) ×2 IMPLANT

## 2017-02-14 NOTE — H&P (View-Only) (Signed)
Patient name: Tabitha Green MRN: [id number] DOB: 29-Mar-1946 Sex: female   REASON FOR CONSULT:    Aneurysm of right brachiocephalic AV fistula. Consult is requested by the emergency department at [facility]   HPI:   [name] is a pleasant 71 y.o. female, who had a right brachiocephalic AV fistula placed in [place] in 2012. She began dialysis in 2014. She underwent a renal transplant in [facility] on 11/06/2015. This has been functioning well and she has not been using her fistula. The fistula has been aneurysmal for some time. She was seen in our office and December 2017 and conservative treatment was recommended.  She states that just yesterday she noted that the fistula at the antecubital level appear to be significantly enlarged and there was also some erythema present here. She also feels that the other 2 aneurysmal areas have gradually gotten larger. She was seen in the emergency department at [facility] and they felt that there was potential risk for rupture. Given that vascular surgery is only done here at [facility], I asked that the patient be transferred to the [facility] emergency department.  She denies any significant pain associated with her fistula in the right upper arm. She denies any paresthesias in the right upper arm.  Past Medical History:  Diagnosis Date  . Anemia of chronic kidney failure    r/t kidney function- receives Procrit  . Arthritis   . Chronic renal insufficiency, stage III (moderate) 10/2015   Post-transplant Cr 1.3, GFR 41 as of June 2017 f/u w/ [facility]  . CMV infection [facility]) summer 2017   Valcyte per ID/Renal transplant team  . Diverticulosis    a. 05/2012 colonoscopy  . Erosive lichen planus of vulva   . ESRD (end stage renal disease) ([place])    began dialysis 2014--followed by Dr. [name].  Received deceased donor kidney transplant 10/2015.  [name] FSGS (focal segmental glomerulosclerosis)    right kidney; hx of left  renal cell cancer and got nephrectomy 1993.  [name] GERD (gastroesophageal reflux disease)   . Gout   . Heart murmur    (Diastolic) ECHO 12/3612 showed that this murmur is coming from pt's R arm AV fistula  . Hemodialysis AV fistula aneurysm ([place]) 2017   R arm; conservative mgmt/watchful waiting approach recommended by vascular surgery---f/u with them prn as of 07/2016 visit.  . Hiatal hernia   . History of renal cell cancer 1993   Left nephrectomy  . Hyperlipidemia   . Hypertension   . Hypothyroidism   . Impingement syndrome of left shoulder 04/2016   Dr. Christy Sartorius to PT  . Osteopenia   . Osteoporosis 06/07/2016   DEXA T-score of -3.1.  Fosamax started 06/2016.  Repeat DEXA 2 yrs.  . Renal transplant recipient 11/06/15  . SVT (supraventricular tachycardia) (HCC)     Family History  Problem Relation Age of Onset  . Deep vein thrombosis Mother        post thyroid surgery  . Hypertension Mother   . Heart attack Father 14       deceased  . Hypertension Father   . Cancer Sister        breast  . Cancer Paternal Aunt        pancreatic  . Diabetes Maternal Grandfather   . Stroke Paternal Grandmother        in 89s  . Colon cancer Neg Hx   . Esophageal cancer Neg Hx   . Stomach  cancer Neg Hx   . Rectal cancer Neg Hx     SOCIAL HISTORY: Social History   Social History  . Marital status: Married    Spouse name: N/A  . Number of children: 2  . Years of education: N/A   Occupational History  . Retired    Social History Main Topics  . Smoking status: Never Smoker  . Smokeless tobacco: Never Used  . Alcohol use Yes     Comment: rarely  . Drug use: No  . Sexual activity: Not Currently    Birth control/ protection: Surgical   Other Topics Concern  . Not on file   Social History Narrative   Lives in [place] with husband.  Retired.  Previously worked in [employer] @ [employer]   No T/A/Ds.    Allergies  Allergen Reactions  . Cefaclor Rash  . Naproxen Rash    . Enalapril Maleate Cough  . Metoprolol Tartrate Rash    On legs    No current facility-administered medications for this encounter.    Current Outpatient Prescriptions  Medication Sig Dispense Refill  . acetaminophen (TYLENOL) 500 MG tablet Take 500-750 mg by mouth every 6 (six) hours as needed for pain.    [name] aspirin EC 81 MG EC tablet Take 1 tablet (81 mg total) by mouth daily.    Skipper Cliche SALINE NASAL DROPS NA Place 2 sprays into both nostrils daily.    . Camphor-Eucalyptus-Menthol (VICKS VAPORUB EX) Apply 1 application topically daily.    . clobetasol ointment (TEMOVATE) 2.99 % Apply 1 application topically 2 (two) times daily. Apply to affected area for two weeks then once a day for 2 weeks. 30 g 2  . doxazosin (CARDURA) 2 MG tablet Take 2 mg by mouth 2 (two) times daily.     [name] levothyroxine (SYNTHROID, LEVOTHROID) 125 MCG tablet Take 1 tablet (125 mcg total) by mouth daily. 30 tablet 11  . mycophenolate (MYFORTIC) 360 MG TBEC EC tablet Take 360 mg by mouth 2 (two) times daily.    [name] sulfamethoxazole-trimethoprim (BACTRIM,SEPTRA) 400-80 MG tablet Take 1 tablet by mouth daily.    . tacrolimus (PROGRAF) 1 MG capsule Take 1 mg by mouth See admin instructions. 4 mg in the am and 4 mg in the pm    . triamcinolone (NASACORT ALLERGY 24HR) 55 MCG/ACT AERO nasal inhaler Place 2 sprays into the nose daily.    . verapamil (CALAN-SR) 240 MG CR tablet Take 240 mg by mouth daily.     . ciprofloxacin (CIPRO) 500 MG tablet Take 1 tablet (500 mg total) by mouth every 12 (twelve) hours. 14 tablet 0  . loratadine (CLARITIN) 10 MG tablet Take 20 mg by mouth as needed for allergies.    [name] meclizine (ANTIVERT) 25 MG tablet Take 1 tablet (25 mg total) by mouth 3 (three) times daily as needed for dizziness or nausea. 20 tablet 0    REVIEW OF SYSTEMS:  [X]  denotes positive finding, [ ]  denotes negative finding Cardiac  Comments:  Chest pain or chest pressure:    Shortness of breath upon exertion: X   Short  of breath when lying flat:    Irregular heart rhythm:        Vascular    Pain in calf, thigh, or hip brought on by ambulation:    Pain in feet at night that wakes you up from your sleep:     Blood clot in your veins:    Leg swelling:  Pulmonary    Oxygen at home:    Productive cough:     Wheezing:         Neurologic    Sudden weakness in arms or legs:     Sudden numbness in arms or legs:     Sudden onset of difficulty speaking or slurred speech:    Temporary loss of vision in one eye:     Problems with dizziness:  X       Gastrointestinal    Blood in stool:     Vomited blood:         Genitourinary    Burning when urinating:     Blood in urine:        Psychiatric    Major depression:         Hematologic    Bleeding problems:    Problems with blood clotting too easily:        Skin    Rashes or ulcers:        Constitutional    Fever or chills:     PHYSICAL EXAM:   Vitals:   02/08/17 1603 02/08/17 1715 02/08/17 1722 02/08/17 1724  BP: (!) 141/90   126/65  Pulse: 73 65 69   Resp: 18     Temp: 98.8 F (37.1 C)     TempSrc: Oral     SpO2: 96% 96% 97%     GENERAL: The patient is a well-nourished female, in no acute distress. The vital signs are documented above. CARDIAC: There is a regular rate and rhythm.  VASCULAR: I do not detect carotid bruits. She has palpable radial pulses bilaterally. She has palpable posterior tibial pulses bilaterally. Her fistula has an excellent thrill. Her fistula is dilated throughout. There is no compromise of the skin or eschars noted over her aneurysmal fistula. PULMONARY: There is good air exchange bilaterally without wheezing or rales. ABDOMEN: Soft and non-tender with normal pitched bowel sounds.  MUSCULOSKELETAL: There are no major deformities or cyanosis. NEUROLOGIC: No focal weakness or paresthesias are detected. SKIN: She does have some cellulitis just above the antecubital level. PSYCHIATRIC: The patient has a  normal affect.  DATA:    DUPLEX RIGHT AV FISTULA: I have reviewed the duplex of her right upper arm fistula which was done today. This demonstrated a patent but aneurysmal right upper arm fistula.  MEDICAL ISSUES:   ANEURYSMAL RIGHT UPPER ARM AV FISTULA: This patient has a markedly aneurysmal right upper arm fistula. There is some cellulitis over the proximal fistula and I have recommended that she be placed on antibiotics so that this consult wound before we can consider ligation of her fistula and excision of these large aneurysms. Given the cellulitis would be best awake to this resolves before proceeding with surgery given the risk of infection. I do not think that the fistula is in eminent risk of bleeding as there is really no compromise skin. However the fistula is quite aneurysmal and given that it is no longer being used I think it makes sense to ligate this and address these aneurysms in the next few weeks. She will discuss this with her transplant surgeons in [place] and I will tentatively schedule her for surgery on Tuesday next week. My office will call to make those arrangements.  Deitra Mayo [facility] [phone]

## 2017-02-14 NOTE — Telephone Encounter (Signed)
-----   Message from Mena Goes, RN sent at 02/14/2017  9:37 AM EDT ----- Regarding: 3 weeks   ----- Message ----- From: Angelia Mould, MD Sent: 02/14/2017   9:24 AM To: Vvs Charge Pool Subject: charge and f/u                                  PROCEDURE:   LIGATION OF RIGHT UPPER ARM AV FISTULA EXCISION OF LARGE ANEURYSMS RIGHT UPPER ARM FISTULA  SURGEON: Judeth Cornfield. Scot Dock, MD, FACS  ASSIST: Gerri Lins PA  She will need a follow up visit in approximately 3 weeks. Thank you. CD

## 2017-02-14 NOTE — Anesthesia Preprocedure Evaluation (Signed)
Anesthesia Evaluation  Patient identified by MRN, date of birth, ID band Patient awake    Reviewed: Allergy & Precautions, NPO status , Patient's Chart, lab work & pertinent test results  Airway Mallampati: II  TM Distance: >3 FB Neck ROM: Full    Dental no notable dental hx.    Pulmonary neg pulmonary ROS,    Pulmonary exam normal breath sounds clear to auscultation       Cardiovascular hypertension, Normal cardiovascular exam Rhythm:Regular Rate:Normal     Neuro/Psych negative neurological ROS  negative psych ROS   GI/Hepatic negative GI ROS, Neg liver ROS, GERD  ,  Endo/Other  Hypothyroidism   Renal/GU DialysisRenal disease  negative genitourinary   Musculoskeletal negative musculoskeletal ROS (+)   Abdominal   Peds negative pediatric ROS (+)  Hematology  (+) anemia ,   Anesthesia Other Findings   Reproductive/Obstetrics negative OB ROS                             Anesthesia Physical Anesthesia Plan  ASA: III  Anesthesia Plan: General   Post-op Pain Management:    Induction: Intravenous  PONV Risk Score and Plan: 1 and Ondansetron, Dexamethasone and Treatment may vary due to age or medical condition  Airway Management Planned: LMA  Additional Equipment:   Intra-op Plan:   Post-operative Plan:   Informed Consent: I have reviewed the patients History and Physical, chart, labs and discussed the procedure including the risks, benefits and alternatives for the proposed anesthesia with the patient or authorized representative who has indicated his/her understanding and acceptance.   Dental advisory given  Plan Discussed with: CRNA and Surgeon  Anesthesia Plan Comments:         Anesthesia Quick Evaluation

## 2017-02-14 NOTE — Anesthesia Postprocedure Evaluation (Signed)
Anesthesia Post Note  Patient: Tabitha Green  Procedure(s) Performed: Procedure(s) (LRB): LIGATION OF ARTERIOVENOUS  FISTULA (Right) EXCISION OF RIGHT BRACHIOCEPHALIC ARTERIOVENOUS FISTULA ANEURYSM (Right)     Patient location during evaluation: PACU Anesthesia Type: General Level of consciousness: awake and alert Pain management: pain level controlled Vital Signs Assessment: post-procedure vital signs reviewed and stable Respiratory status: spontaneous breathing, nonlabored ventilation, respiratory function stable and patient connected to nasal cannula oxygen Cardiovascular status: blood pressure returned to baseline and stable Postop Assessment: no signs of nausea or vomiting Anesthetic complications: no    Last Vitals:  Vitals:   02/14/17 0632 02/14/17 0934  BP: (!) 169/69 (!) 157/74  Pulse: 76 70  Resp: 20 (!) 21  Temp: 36.7 C 36.4 C    Last Pain:  Vitals:   02/14/17 0632  TempSrc: Oral                 Nikky Duba S

## 2017-02-14 NOTE — Anesthesia Procedure Notes (Signed)
Procedure Name: LMA Insertion Date/Time: 02/14/2017 7:41 AM Performed by: Lance Coon Pre-anesthesia Checklist: Patient identified, Emergency Drugs available, Suction available, Patient being monitored and Timeout performed Patient Re-evaluated:Patient Re-evaluated prior to inductionOxygen Delivery Method: Circle system utilized Preoxygenation: Pre-oxygenation with 100% oxygen Intubation Type: IV induction LMA: LMA inserted LMA Size: 4.0 Number of attempts: 1 Placement Confirmation: positive ETCO2 and breath sounds checked- equal and bilateral Tube secured with: Tape Dental Injury: Teeth and Oropharynx as per pre-operative assessment

## 2017-02-14 NOTE — Interval H&P Note (Signed)
History and Physical Interval Note:  02/14/2017 7:22 AM  [name]  has presented today for surgery, with the diagnosis of Aneurysmal Right Upper Arm Arteriovenous Fistula I72.9  The various methods of treatment have been discussed with the patient and family. After consideration of risks, benefits and other options for treatment, the patient has consented to  Procedure(s): LIGATION OF ARTERIOVENOUS  FISTULA (Right) EXCISION OF RIGHT BRACHIOCEPHALIC ARTERIOVENOUS FISTULA ANEURYSM (Right) as a surgical intervention .  The patient's history has been reviewed, patient examined, no change in status, stable for surgery.  I have reviewed the patient's chart and labs.  Questions were answered to the patient's satisfaction.     Deitra Mayo

## 2017-02-14 NOTE — Transfer of Care (Signed)
Immediate Anesthesia Transfer of Care Note  Patient: Tabitha Green  Procedure(s) Performed: Procedure(s): LIGATION OF ARTERIOVENOUS  FISTULA (Right) EXCISION OF RIGHT BRACHIOCEPHALIC ARTERIOVENOUS FISTULA ANEURYSM (Right)  Patient Location: PACU  Anesthesia Type:General  Level of Consciousness: awake and patient cooperative  Airway & Oxygen Therapy: Patient Spontanous Breathing  Post-op Assessment: Report given to RN and Post -op Vital signs reviewed and stable  Post vital signs: Reviewed and stable  Last Vitals:  Vitals:   02/14/17 0632  BP: (!) 169/69  Pulse: 76  Resp: 20  Temp: 36.7 C    Last Pain:  Vitals:   02/14/17 7414  TempSrc: Oral         Complications: No apparent anesthesia complications

## 2017-02-14 NOTE — Op Note (Signed)
    NAME: [name]    MRN: [id number] DOB: 11-Dec-1945    DATE OF OPERATION: 02/14/2017  PREOP DIAGNOSIS:    Aneurysmal right upper arm AV fistula with compromised skin  POSTOP DIAGNOSIS:    Same  PROCEDURE:    LIGATION OF RIGHT UPPER ARM AV FISTULA EXCISION OF LARGE ANEURYSMS RIGHT UPPER ARM FISTULA  SURGEON: Judeth Cornfield. Scot Dock, MD, FACS  ASSIST: Gerri Lins PA  ANESTHESIA: Gen.   EBL: Minimal  INDICATIONS:    Tabitha Green is a 71 y.o. female who has a functioning renal transplant. She no longer uses her upper arm fistula. This had been enlarging and she had areas of compromised skin and it was felt to be her risk for bleeding. This reason I recommended ligation of the fistula and excision of the large aneurysms. There were 3 aneurysms. It was 1 at the anastomosis, and then 2 larger ones further centrally.  FINDINGS:    Palpable radial pulse at the completion of the procedure.  TECHNIQUE:    The patient was taken to the operative room and received a general anesthetic. The right upper extremity was prepped and draped in usual sterile fashion. The incision at the antecubital level was opened. I did infiltrate with functional lidocaine with epinephrine. Here the fistula was dissected free to 2 where it was anastomosed to the radial artery which was dilated also. Elliptical incisions were then made encompassing the 2 large aneurysms further centrally and the aneurysm was fully dissected free in both locations. The aneurysm was then ligated distally just above the level of the anastomosis. A 5-0 Prolene suture was also placed for security. The large aneurysm was then excised. Hemostasis was obtained in the wounds. Each of the wounds was closed with deep layer of 3-0 Vicryl and the skin closed with 4-0 Vicryl. Sterile dressing was applied. The patient tolerated well and was transferred to the recovery room in stable condition. All needle and sponge counts were  correct.  Deitra Mayo, MD, FACS [facility]  DATE OF DICTATION:   02/14/2017

## 2017-02-14 NOTE — Telephone Encounter (Signed)
Sched appt 03/15/17 at 9:45. Lm on hm#.

## 2017-02-15 ENCOUNTER — Encounter (HOSPITAL_COMMUNITY): Payer: Self-pay | Admitting: Vascular Surgery

## 2017-02-20 ENCOUNTER — Encounter (HOSPITAL_COMMUNITY): Payer: Self-pay | Admitting: Vascular Surgery

## 2017-02-21 ENCOUNTER — Telehealth: Payer: Self-pay | Admitting: Family Medicine

## 2017-02-21 ENCOUNTER — Emergency Department (INDEPENDENT_AMBULATORY_CARE_PROVIDER_SITE_OTHER)
Admission: EM | Admit: 2017-02-21 | Discharge: 2017-02-21 | Disposition: A | Discharge status: Home / Self Care | Payer: Medicare Other | Source: Home / Self Care | Attending: Family Medicine | Admitting: Family Medicine

## 2017-02-21 ENCOUNTER — Emergency Department (INDEPENDENT_AMBULATORY_CARE_PROVIDER_SITE_OTHER): Payer: Medicare Other

## 2017-02-21 ENCOUNTER — Encounter: Payer: Self-pay | Admitting: Emergency Medicine

## 2017-02-21 DIAGNOSIS — R51 Headache: Secondary | ICD-10-CM | POA: Diagnosis not present

## 2017-02-21 DIAGNOSIS — R0981 Nasal congestion: Secondary | ICD-10-CM

## 2017-02-21 DIAGNOSIS — L03113 Cellulitis of right upper limb: Secondary | ICD-10-CM | POA: Diagnosis not present

## 2017-02-21 IMAGING — DX DG SINUSES COMPLETE 3+V
3 series · 3 of 3 positions shown · non-contrast
Comparison: None.

CLINICAL DATA: Sinus congestion and pressure for the past 2 weeks.

EXAM:
PARANASAL SINUSES - COMPLETE 3 + VIEW

[pns waters]
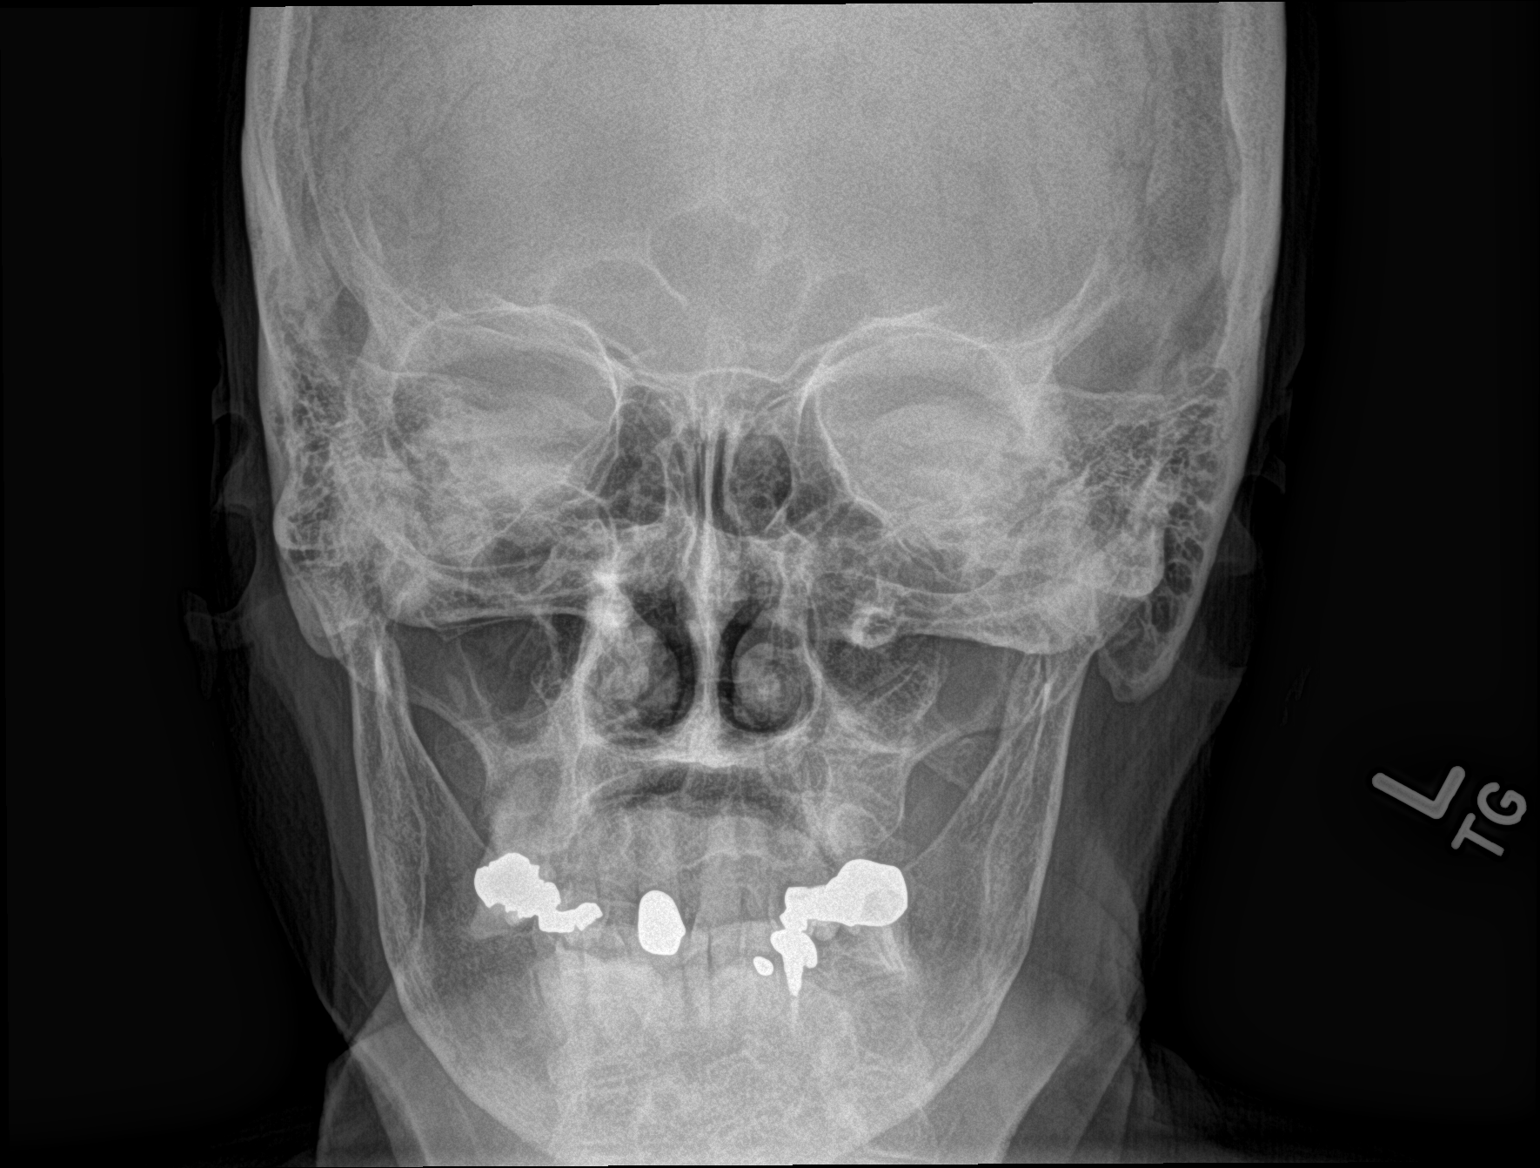

[[person_name]]
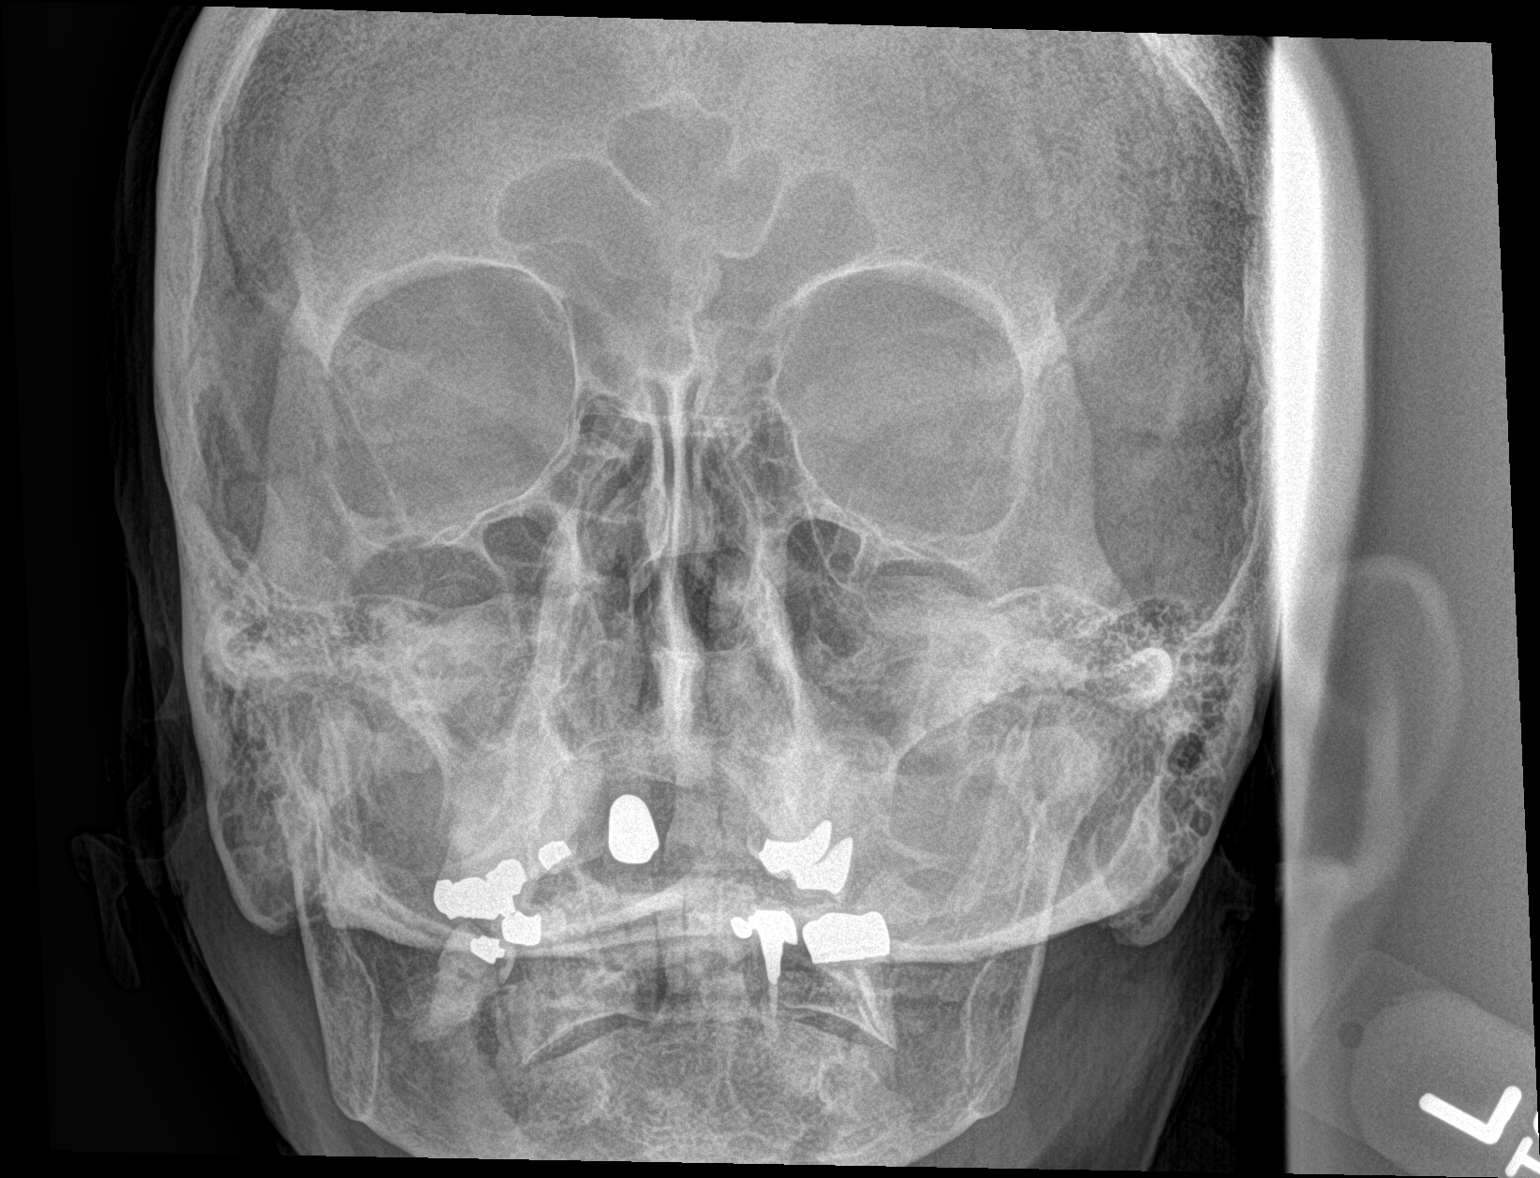

[pns lat]
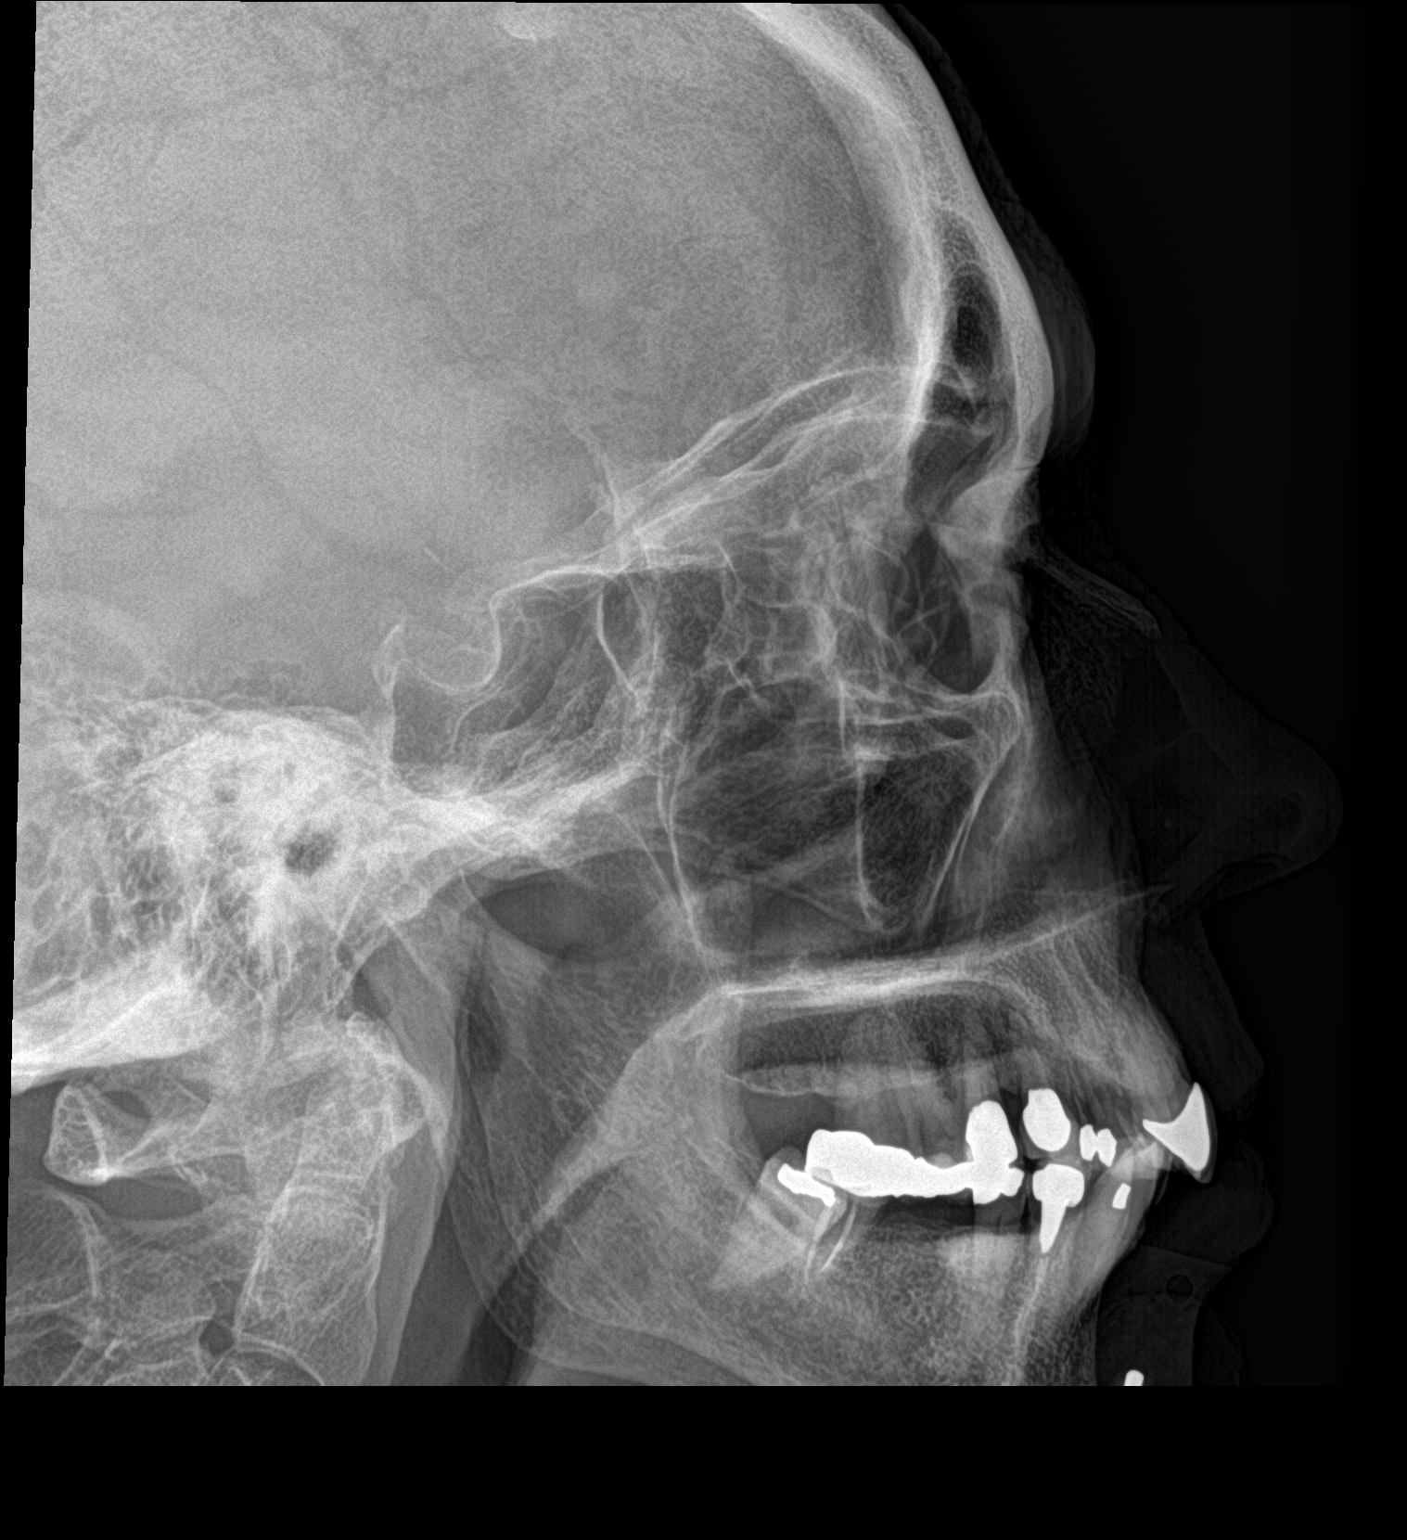

[3 of 3 positions shown; findings below may reference images not displayed]

FINDINGS: Three views of the paranasal sinuses are submitted for
interpretation. There are overlapping bones on all of the views. No
visible mucosal thickening or opacification. No visible air-fluid
levels.
IMPRESSION: No plain radiographic evidence of sinusitis.

## 2017-02-21 MED ORDER — CLINDAMYCIN HCL 300 MG PO CAPS: 300.0000 mg | ORAL_CAPSULE | Freq: Three times a day (TID) | ORAL | 0 refills | Status: AC

## 2017-02-21 NOTE — ED Provider Notes (Signed)
Tabitha Green CARE    CSN: [id number] Arrival date & time: 02/21/17  1501     History   Chief Complaint Chief Complaint  Patient presents with  . Recurrent Skin Infections    HPI Tabitha Green is a 71 y.o. female.   Patient reports that one week ago she underwent ligation of a hemodialysis AV fistula and excision of three large aneurysms in her right arm.  Her surgical wounds have been healing well without swelling or drainage, and minimal pain.  She is concerned about some erythema that is superior to her incision sites.  She denies fevers, chills, and sweats. She is also concerned that she might have a sinus infection.  She has had bilateral increased facial pressure and sinus congestion for about 2 weeks.   The history is provided by the patient.    Past Medical History:  Diagnosis Date  . Anemia of chronic kidney failure    r/t kidney function- receives Procrit  . Anxiety   . Arthritis   . Cancer [facility])    kidney cancer, left nephrectomy  . Chronic renal insufficiency, stage III (moderate) 10/2015   Post-transplant Cr 1.3, GFR 41 as of June 2017 f/u w/ [facility]  . CMV infection [facility]) summer 2017   Valcyte per ID/Renal transplant team  . Diverticulosis    a. 05/2012 colonoscopy  . Erosive lichen planus of vulva   . ESRD (end stage renal disease) ([place])    began dialysis 2014--followed by Dr. Florene Green.  Received deceased donor kidney transplant 10/2015.  [name] FSGS (focal segmental glomerulosclerosis)    right kidney; hx of left renal cell cancer and got nephrectomy 1993.  [name] GERD (gastroesophageal reflux disease)   . Gout   . Heart murmur    (Diastolic) ECHO 08/9145 showed that this murmur is coming from pt's R arm AV fistula  . Hemodialysis AV fistula aneurysm ([place]) 2017   R arm; conservative mgmt/watchful waiting approach recommended by vascular surgery---f/u with them prn as of 07/2016 visit.  . Hiatal hernia   . History of renal cell cancer  1993   Left nephrectomy  . Hyperlipidemia   . Hypertension   . Hypothyroidism   . Impingement syndrome of left shoulder 04/2016   Dr. Christy Green to PT  . Osteopenia   . Osteoporosis 06/07/2016   DEXA T-score of -3.1.  Fosamax started 06/2016.  Repeat DEXA 2 yrs.  . Renal transplant recipient 11/06/15  . Renal transplant recipient   . SVT (supraventricular tachycardia) [facility])     Patient Active Problem List   Diagnosis Date Noted  . Left shoulder pain 05/02/2016  . Wheeze 09/16/2015  . Cough 09/16/2015  . Acute bronchitis 09/16/2015  . History of renal carcinoma 05/14/2015  . Vulvar ulceration 06/22/2014  . End stage renal disease ([place]) 04/09/2014  . Mechanical complication of other vascular device, implant, and graft 04/09/2014  . Cervical spondylosis 10/10/2013  . Aortic insufficiency 08/20/2013  . Hyperglycemia 12/14/2012  . SVT (supraventricular tachycardia) ([place]) 12/13/2012  . Uremia 12/13/2012  . Chronic kidney disease (CKD), stage IV (severe) ([place]) 12/07/2012  . Bradycardia 12/07/2012  . VITAMIN D DEFICIENCY 09/08/2009  . CHRON GLOMERULONEPHRIT W/LES MEMBRANOUS GLN 09/08/2009  . FATIGUE 09/08/2009  . DIVERTICULOSIS, COLON 09/26/2008  . OSTEOPENIA 09/26/2008  . COLONIC POLYPS, HX OF 09/26/2008  . Hypothyroidism 10/02/2007  . OTHER AND UNSPECIFIED HYPERLIPIDEMIA 10/02/2007  . BENIGN POSITIONAL VERTIGO 10/02/2007  . Unspecified essential hypertension 10/02/2007  . Gout, unspecified 03/26/2007  .  DISORDER, DYSMETABOLIC SYNDROME X 62/83/6629  . HX, PERSONAL, MALIGNANCY, KIDNEY 03/26/2007  . OSTEOARTHRITIS 12/26/2006    Past Surgical History:  Procedure Laterality Date  . AV FISTULA PLACEMENT Right    Right arm: aneurismal dilatation 2017 being followed by CV surgeons  . CARDIOVASCULAR STRESS TEST     03/10/15 ETT ([facility]): Exercise ECG negative at 83% max predicted HR.   [name] COLONOSCOPY  2003; 05/2012   diverticulosis, no polyps.  Recall 10 yrs  .  colonoscopy with polypectomy     Dr Tabitha Green  . DEXA  06/07/2016   T-score -3.1  . KIDNEY TRANSPLANT  November 29, 2015   Deceased donor kidney transplant, with Thymoglobulin induction  . LIGATION OF ARTERIOVENOUS  FISTULA Right 02/14/2017   Procedure: LIGATION OF ARTERIOVENOUS  FISTULA;  Surgeon: Tabitha Mould, MD;  Location: [place];  Service: Vascular;  Laterality: Right;  . NEPHRECTOMY  1993   for malignancy- left  . RENAL BIOPSY     right  . RESECTION OF ARTERIOVENOUS FISTULA ANEURYSM Right 02/14/2017   Procedure: EXCISION OF RIGHT BRACHIOCEPHALIC ARTERIOVENOUS FISTULA ANEURYSM;  Surgeon: Tabitha Mould, MD;  Location: [place];  Service: Vascular;  Laterality: Right;  . TEE WITHOUT CARDIOVERSION N/A 08/27/2013   Procedure: TRANSESOPHAGEAL ECHOCARDIOGRAM (TEE);  Surgeon: Tabitha Hector, MD;  Location: [facility] ENDOSCOPY;  Service: Cardiovascular;  Laterality: N/A;  . TOTAL ABDOMINAL HYSTERECTOMY W/ BILATERAL SALPINGOOPHORECTOMY  1997   fibroids  . TRANSTHORACIC ECHOCARDIOGRAM     03/10/15 echo ([facility]): LV cavity normal in size, focal basal hypertrophy, normal systolic function, EF 47% (visual est). Normal wall motion, no regional wall motion abnormalities. Mild diastolic dysfunction with normal LA chamber size. No significant valve stenosis or regurgitation.  . VULVA / PERINEUM BIOPSY  2015    OB History    Gravida Para Term Preterm AB Living   2 2 2  0 0 2   SAB TAB Ectopic Multiple Live Births   0 0 0 0         Home Medications    Prior to Admission medications   Medication Sig Start Date End Date Taking? Authorizing Provider  acetaminophen (TYLENOL) 500 MG tablet Take 500-750 mg by mouth every 6 (six) hours as needed for pain.    [provider]  aspirin EC 81 MG EC tablet Take 1 tablet (81 mg total) by mouth daily. 12/09/12   Green, Tabitha O, PA-C  AYR SALINE NASAL DROPS NA Place 2 sprays into both nostrils daily.    [provider]    Camphor-Eucalyptus-Menthol (VICKS VAPORUB EX) Apply 1 application topically daily.    [provider]  clindamycin (CLEOCIN) 300 MG capsule Take 1 capsule (300 mg total) by mouth 3 (three) times daily. 02/21/17 03/03/17  Kandra Nicolas, MD  clobetasol ointment (TEMOVATE) 6.54 % Apply 1 application topically 2 (two) times daily. Apply to affected area for two weeks then once a day for 2 weeks. 06/22/14   Guss Bunde, MD  doxazosin (CARDURA) 2 MG tablet Take 2 mg by mouth 2 (two) times daily.  07/19/16   [provider]  levothyroxine (SYNTHROID, LEVOTHROID) 125 MCG tablet Take 1 tablet (125 mcg total) by mouth daily. 08/09/16   McGowen, Adrian Blackwater, MD  loratadine (CLARITIN) 10 MG tablet Take 20 mg by mouth as needed for allergies.    [provider]  meclizine (ANTIVERT) 25 MG tablet Take 1 tablet (25 mg total) by mouth 3 (three) times daily  as needed for dizziness or nausea. 07/30/15   Noe Gens, PA-C  mycophenolate (MYFORTIC) 360 MG TBEC EC tablet Take 360 mg by mouth 2 (two) times daily.    [provider]  oxyCODONE-acetaminophen (ROXICET) 5-325 MG tablet Take 1-2 tablets by mouth every 4 (four) hours as needed for moderate pain. 02/14/17   Tabitha Mould, MD  silver sulfADIAZINE (SILVADENE) 1 % cream Apply 1 application topically daily. Mixes with Triamcinolone cream    [provider]  SODIUM BICARBONATE PO Take 650 mg by mouth 2 (two) times daily. Takes 2 in AM and 2 at bedtime    [provider]  sulfamethoxazole-trimethoprim (BACTRIM,SEPTRA) 400-80 MG tablet Take 1 tablet by mouth daily.    [provider]  tacrolimus (PROGRAF) 1 MG capsule Take 1 mg by mouth See admin instructions. 4 mg in the am and 4 mg in the pm    [provider]  triamcinolone (NASACORT ALLERGY 24HR) 55 MCG/ACT AERO nasal inhaler Place 2 sprays into the nose daily.    [provider]  triamcinolone cream (KENALOG) 0.1 % Apply 1  application topically daily.    [provider]  verapamil (CALAN-SR) 240 MG CR tablet Take 240 mg by mouth daily.  01/14/16   [provider]    Family History Family History  Problem Relation Age of Onset  . Deep vein thrombosis Mother        post thyroid surgery  . Hypertension Mother   . Heart attack Father 60       deceased  . Hypertension Father   . Cancer Sister        breast  . Cancer Paternal Aunt        pancreatic  . Diabetes Maternal Grandfather   . Stroke Paternal Grandmother        in 45s  . Colon cancer Neg Hx   . Esophageal cancer Neg Hx   . Stomach cancer Neg Hx   . Rectal cancer Neg Hx     Social History Social History  Substance Use Topics  . Smoking status: Never Smoker  . Smokeless tobacco: Never Used  . Alcohol use Yes     Comment: rarely     Allergies   Cefaclor; Naproxen; Enalapril maleate; and Metoprolol tartrate   Review of Systems Review of Systems No sore throat No cough No pleuritic pain No wheezing + nasal congestion + post-nasal drainage + sinus pain/pressure No itchy/red eyes No earache No hemoptysis No SOB No fever/chills No nausea No vomiting No abdominal pain No diarrhea No urinary symptoms + skin rash + fatigue No myalgias No headache  Physical Exam Triage Vital Signs ED Triage Vitals  Enc Vitals Group     BP 02/21/17 1536 (!) 150/88     Pulse Rate 02/21/17 1536 76     Resp --      Temp 02/21/17 1536 98.4 F (36.9 C)     Temp Source 02/21/17 1536 Oral     SpO2 02/21/17 1536 96 %     Weight 02/21/17 1537 222 lb (100.7 kg)     Height --      Head Circumference --      Peak Flow --      Pain Score 02/21/17 1537 2     Pain Loc --      Pain Edu? --      Excl. in [place]? --    No data found.   Updated Vital Signs BP [name])  150/88 (BP Location: Right Arm)   Pulse 76   Temp 98.4 F (36.9 C) (Oral)   Wt 222 lb (100.7 kg)   SpO2 96%   BMI 36.94 kg/m   Visual Acuity Right Eye Distance:     Left Eye Distance:   Bilateral Distance:    Right Eye Near:   Left Eye Near:    Bilateral Near:     Physical Exam  Constitutional: She appears well-developed and well-nourished. No distress.  HENT:  Head: Normocephalic.  Right Ear: Tympanic membrane, external ear and ear canal normal.  Left Ear: Tympanic membrane, external ear and ear canal normal.  Nose: Mucosal edema present.  Mouth/Throat: Oropharynx is clear and moist.  Maxillary sinus tenderness is present.  Eyes: Pupils are equal, round, and reactive to light. Conjunctivae are normal. Right eye exhibits no discharge. Left eye exhibits no discharge.  Neck: Neck supple.  Cardiovascular: Normal heart sounds.   Pulmonary/Chest: Breath sounds normal.  Abdominal: There is no tenderness.  Musculoskeletal:       Arms: Right arm reveals healed surgical incisions above and below the antecubital fossa.  No swelling, drainage, or tenderness.  There is however mild macular erythema over the biceps area as noted on diagram.   Lymphadenopathy:    She has no cervical adenopathy.  Neurological: She is alert.  Skin: Skin is warm and dry.  Nursing note and vitals reviewed.       UC Treatments / Results  Labs (all labs ordered are listed, but only abnormal results are displayed) Labs Reviewed - No data to display  EKG  EKG Interpretation None       Radiology Dg Sinuses Complete  Result Date: 02/21/2017 CLINICAL DATA:  Sinus congestion and pressure for the past 2 weeks. EXAM: PARANASAL SINUSES - COMPLETE 3 + VIEW COMPARISON:  None. FINDINGS: Three views of the paranasal sinuses are submitted for interpretation. There are overlapping bones on all of the views. No visible mucosal thickening or opacification. No visible air-fluid levels. IMPRESSION: No plain radiographic evidence of sinusitis. Electronically Signed   By: Claudie Revering M.D.   On: 02/21/2017 17:09    Procedures Procedures (including critical care  time)  Medications Ordered in UC Medications - No data to display   Initial Impression / Assessment and Plan / UC Course  I have reviewed the triage vital signs and the nursing notes.  Pertinent labs & imaging results that were available during my care of the patient were reviewed by me and considered in my medical decision making (see chart for details).    ? Mild post-op cellulitis.  Begin Clindamycin for staph coverage  For sinus congestion may use saline nasal spray or irrigation several times daily, and Flonase or Nasacort nasal spray once daily. Followup with Family Doctor if not improved in one week.     Final Clinical Impressions(s) / UC Diagnoses   Final diagnoses:  Cellulitis of right arm  Sinus congestion    New Prescriptions New Prescriptions   CLINDAMYCIN (CLEOCIN) 300 MG CAPSULE    Take 1 capsule (300 mg total) by mouth 3 (three) times daily.     Kandra Nicolas, MD 03/01/17 1038

## 2017-02-21 NOTE — Telephone Encounter (Signed)
Patient Name: DYLYNN KETNER  DOB: 1945-11-13    Initial Comment Caller had arm surgery last week, arm is red and puffy. Called surgeon, but MDs are out, said to call PCP.   Nurse Assessment      Guidelines    Guideline Title Affirmed Question Affirmed Notes       Final Disposition User   FINAL ATTEMPT MADE - no message left Standifer, RN, Heather    Comments  Person at first number states that caller is not there at this time. The second number is busy

## 2017-02-21 NOTE — ED Triage Notes (Signed)
Pt had surgery on her right arm on 7/10....c/o increase redness and tenderness the last few days. Denies fever or pain.

## 2017-02-21 NOTE — Discharge Instructions (Signed)
For sinus congestion may use saline nasal spray or irrigation several times daily, and Flonase or Nasacort nasal spray once daily.

## 2017-02-21 NOTE — Telephone Encounter (Signed)
Patient was transferred from [facility]. Patient states she does not want to talk to nurse at [facility]  Advised patient Dr. Anitra Lauth is out of the office this afternoon but will review the note in the morning. Patient does have an appointment Friday with Vascular Surgeon.  Patient is requesting an appointment with Dr. Anitra Lauth for infection until she sees surgeon on Friday. Please advise.

## 2017-02-21 NOTE — Telephone Encounter (Signed)
Pt needs to make appt to come in and see me about her arm before I can rx any meds: I have to examine her and determine her dx and then decide the best treatment.--thx

## 2017-02-22 ENCOUNTER — Encounter: Payer: Self-pay | Admitting: Family

## 2017-02-22 ENCOUNTER — Ambulatory Visit (INDEPENDENT_AMBULATORY_CARE_PROVIDER_SITE_OTHER): Payer: Self-pay | Admitting: Family

## 2017-02-22 VITALS — BP 146/87 | HR 73 | Temp 99.4°F | Resp 20 | Ht 65.0 in | Wt 221.0 lb

## 2017-02-22 DIAGNOSIS — Z940 Kidney transplant status: Secondary | ICD-10-CM

## 2017-02-22 DIAGNOSIS — T82898D Other specified complication of vascular prosthetic devices, implants and grafts, subsequent encounter: Secondary | ICD-10-CM

## 2017-02-22 NOTE — Telephone Encounter (Signed)
SW pt and she stated that she went to an urgent care yesterday and the put her on clindamycin 300mg  TID, provider advised her that if her arm was looking better after 5 days she could stop the antibiotic. She stated that she is going to keep her apt with the Vascular Surgeon on Friday but will call back if things worsen before then.

## 2017-02-22 NOTE — Progress Notes (Signed)
Postoperative Access Visit   History of Present Illness  Tabitha Green is a 71 y.o. year old female who is s/p ligation of the fistula and excision of 3 large aneurysms on 02-14-17 by Dr. Scot Dock. The patient has a functioning renal transplant. She no longer uses her upper arm fistula. This had been enlarging and she had areas of compromised skin and it was felt to be her risk for bleeding.   Pt returns today with c/o mild swelling and redness of right upper arm for the last few days.  She was seen in an urgent care center yesterday for right arm cellulitis and sinus congestion. Clindamycin 300 mg po tid, disp #21 was prescribed.  The patient's right upper arm incisions are healing well with no signs of infection.  The patient notes no steal symptoms.  The patient is able to complete their activities of daily living.    For VQI Use Only  PRE-ADM LIVING: Home  AMB STATUS: Ambulatory   Past Medical History:  Diagnosis Date  . Anemia of chronic kidney failure    r/t kidney function- receives Procrit  . Anxiety   . Arthritis   . Cancer [facility])    kidney cancer, left nephrectomy  . Chronic renal insufficiency, stage III (moderate) 10/2015   Post-transplant Cr 1.3, GFR 41 as of June 2017 f/u w/ [facility]  . CMV infection [facility]) summer 2017   Valcyte per ID/Renal transplant team  . Diverticulosis    a. 05/2012 colonoscopy  . Erosive lichen planus of vulva   . ESRD (end stage renal disease) ([name])    began dialysis 2014--followed by Dr. [name].  Received deceased donor kidney transplant 10/2015.  [name] FSGS (focal segmental glomerulosclerosis)    right kidney; hx of left renal cell cancer and got nephrectomy 1993.  [name] GERD (gastroesophageal reflux disease)   . Gout   . Heart murmur    (Diastolic) ECHO 03/320 showed that this murmur is coming from pt's R arm AV fistula  . Hemodialysis AV fistula aneurysm ([place]) 2017   R arm; conservative mgmt/watchful waiting approach  recommended by vascular surgery---f/u with them prn as of 07/2016 visit.  . Hiatal hernia   . History of renal cell cancer 1993   Left nephrectomy  . Hyperlipidemia   . Hypertension   . Hypothyroidism   . Impingement syndrome of left shoulder 04/2016   Dr. Christy Sartorius to PT  . Osteopenia   . Osteoporosis 06/07/2016   DEXA T-score of -3.1.  Fosamax started 06/2016.  Repeat DEXA 2 yrs.  . Renal transplant recipient 11/08/2015  . Renal transplant recipient   . SVT (supraventricular tachycardia) ([facility])     Past Surgical History:  Procedure Laterality Date  . AV FISTULA PLACEMENT Right    Right arm: aneurismal dilatation 2017 being followed by CV surgeons  . CARDIOVASCULAR STRESS TEST     03/10/15 ETT ([facility]): Exercise ECG negative at 83% max predicted HR.   [name] COLONOSCOPY  2003; 05/2012   diverticulosis, no polyps.  Recall 10 yrs  . colonoscopy with polypectomy     Dr Henrene Pastor  . DEXA  06/07/2016   T-score -3.1  . KIDNEY TRANSPLANT  2015/11/08   Deceased donor kidney transplant, with Thymoglobulin induction  . LIGATION OF ARTERIOVENOUS  FISTULA Right 02/14/2017   Procedure: LIGATION OF ARTERIOVENOUS  FISTULA;  Surgeon: Angelia Mould, MD;  Location: [place];  Service: Vascular;  Laterality: Right;  . NEPHRECTOMY  1993  for malignancy- left  . RENAL BIOPSY     right  . RESECTION OF ARTERIOVENOUS FISTULA ANEURYSM Right 02/14/2017   Procedure: EXCISION OF RIGHT BRACHIOCEPHALIC ARTERIOVENOUS FISTULA ANEURYSM;  Surgeon: Angelia Mould, MD;  Location: [place];  Service: Vascular;  Laterality: Right;  . TEE WITHOUT CARDIOVERSION N/A 08/27/2013   Procedure: TRANSESOPHAGEAL ECHOCARDIOGRAM (TEE);  Surgeon: [name], MD;  Location: [facility] ENDOSCOPY;  Service: Cardiovascular;  Laterality: N/A;  . TOTAL ABDOMINAL HYSTERECTOMY W/ BILATERAL SALPINGOOPHORECTOMY  1997   fibroids  . TRANSTHORACIC ECHOCARDIOGRAM     03/10/15 echo ([facility]): LV cavity normal in  size, focal basal hypertrophy, normal systolic function, EF 86% (visual est). Normal wall motion, no regional wall motion abnormalities. Mild diastolic dysfunction with normal LA chamber size. No significant valve stenosis or regurgitation.  [place] Dana / PERINEUM BIOPSY  2015    Social History   Social History  . Marital status: Married    Spouse name: N/A  . Number of children: 2  . Years of education: N/A   Occupational History  . Retired    Social History Main Topics  . Smoking status: Never Smoker  . Smokeless tobacco: Never Used  . Alcohol use Yes     Comment: rarely  . Drug use: No  . Sexual activity: Not Currently    Birth control/ protection: Surgical   Other Topics Concern  . Not on file   Social History Narrative   Lives in [place] with husband.  Retired.  Previously worked in [employer] @ [employer]   No T/A/Ds.    Allergies  Allergen Reactions  . Cefaclor Rash  . Naproxen Rash  . Enalapril Maleate Cough  . Metoprolol Tartrate Rash    On legs    Current Outpatient Prescriptions on File Prior to Visit  Medication Sig Dispense Refill  . acetaminophen (TYLENOL) 500 MG tablet Take 500-750 mg by mouth every 6 (six) hours as needed for pain.    [name] aspirin EC 81 MG EC tablet Take 1 tablet (81 mg total) by mouth daily.    Skipper Cliche SALINE NASAL DROPS NA Place 2 sprays into both nostrils daily.    . Camphor-Eucalyptus-Menthol (VICKS VAPORUB EX) Apply 1 application topically daily.    . clindamycin (CLEOCIN) 300 MG capsule Take 1 capsule (300 mg total) by mouth 3 (three) times daily. 21 capsule 0  . doxazosin (CARDURA) 2 MG tablet Take 2 mg by mouth 2 (two) times daily.     [name] levothyroxine (SYNTHROID, LEVOTHROID) 125 MCG tablet Take 1 tablet (125 mcg total) by mouth daily. 30 tablet 11  . loratadine (CLARITIN) 10 MG tablet Take 20 mg by mouth as needed for allergies.    [name] meclizine (ANTIVERT) 25 MG tablet Take 1 tablet (25 mg total) by mouth 3 (three) times daily as  needed for dizziness or nausea. 20 tablet 0  . mycophenolate (MYFORTIC) 360 MG TBEC EC tablet Take 360 mg by mouth 2 (two) times daily.    . silver sulfADIAZINE (SILVADENE) 1 % cream Apply 1 application topically daily. Mixes with Triamcinolone cream    . SODIUM BICARBONATE PO Take 650 mg by mouth 2 (two) times daily. Takes 2 in AM and 2 at bedtime    . sulfamethoxazole-trimethoprim (BACTRIM,SEPTRA) 400-80 MG tablet Take 1 tablet by mouth daily.    [name] triamcinolone (NASACORT ALLERGY 24HR) 55 MCG/ACT AERO nasal inhaler Place 2 sprays into the nose daily.    [name] triamcinolone cream (KENALOG)  0.1 % Apply 1 application topically daily.    . verapamil (CALAN-SR) 240 MG CR tablet Take 240 mg by mouth daily.      No current facility-administered medications on file prior to visit.      Physical Examination Vitals:   02/22/17 1319  BP: (!) 146/87  Pulse: 73  Resp: 20  Temp: 99.4 F (37.4 C)  TempSrc: Oral  SpO2: 95%  Weight: 221 lb (100.2 kg)  Height: 5\' 5"  (1.651 m)   Body mass index is 36.78 kg/m.  Right upper arm incisions healing well with surgical glue noted,  no erythema, no swelling at incision, no drainage. There is mild swelling and erythema proximal to the incisions. There are resolving ecchymotic areas at lateral aspect of her right upper arm.  Bilateral hand grip is 5/5, sensation in digits is intact. Bilateral radial pulses are 1+ palpable.   Medical Decision Making  CRUCITA LACORTE is a 71 y.o. year old female who presents s/p ligation of the fistula and excision of 3 large aneurysms on 02-14-17 by Dr. Scot Dock. The patient has a functioning renal transplant. She no longer uses her upper arm fistula. She has some mild swelling and erythema of her right upper arm. Since she is taking immunosuppressive agent s/p renal transplant, I agree with antibiotics as prophylaxis to and treatment of possible cellulitis right upper arm.   Elevate right arm above heart as often as possible to  decrease the mild swelling in right upper arm  Continue clindamycin as prescribed yesterday from the urgent care center.   Follow up with Dr. Scot Dock as scheduled on 03-15-17. I advised pt to notify us for any further concerns.   Thank you for allowing [place] to participate in this patient's care.  Kleber Crean, Sharmon Leyden, RN, MSN, FNP-C [facility] Office: [phone]  02/22/2017, 1:29 PM  Clinic MD: Early on call

## 2017-02-22 NOTE — Telephone Encounter (Signed)
Noted  

## 2017-02-22 NOTE — Telephone Encounter (Signed)
Left message for pt to call back  °

## 2017-02-24 ENCOUNTER — Ambulatory Visit: Payer: Medicare Other | Admitting: Family

## 2017-02-27 DIAGNOSIS — Z940 Kidney transplant status: Secondary | ICD-10-CM | POA: Diagnosis not present

## 2017-02-27 DIAGNOSIS — Z79899 Other long term (current) drug therapy: Secondary | ICD-10-CM | POA: Diagnosis not present

## 2017-02-27 DIAGNOSIS — R799 Abnormal finding of blood chemistry, unspecified: Secondary | ICD-10-CM | POA: Diagnosis not present

## 2017-02-28 ENCOUNTER — Encounter: Payer: Self-pay | Admitting: Vascular Surgery

## 2017-03-07 DIAGNOSIS — Z940 Kidney transplant status: Secondary | ICD-10-CM | POA: Diagnosis not present

## 2017-03-07 DIAGNOSIS — Z79899 Other long term (current) drug therapy: Secondary | ICD-10-CM | POA: Diagnosis not present

## 2017-03-07 DIAGNOSIS — R799 Abnormal finding of blood chemistry, unspecified: Secondary | ICD-10-CM | POA: Diagnosis not present

## 2017-03-13 DIAGNOSIS — Z79899 Other long term (current) drug therapy: Secondary | ICD-10-CM | POA: Diagnosis not present

## 2017-03-13 DIAGNOSIS — Z940 Kidney transplant status: Secondary | ICD-10-CM | POA: Diagnosis not present

## 2017-03-13 DIAGNOSIS — R799 Abnormal finding of blood chemistry, unspecified: Secondary | ICD-10-CM | POA: Diagnosis not present

## 2017-03-15 ENCOUNTER — Encounter: Payer: Self-pay | Admitting: Vascular Surgery

## 2017-03-15 ENCOUNTER — Ambulatory Visit (INDEPENDENT_AMBULATORY_CARE_PROVIDER_SITE_OTHER): Payer: Self-pay | Admitting: Vascular Surgery

## 2017-03-15 VITALS — BP 127/85 | HR 83 | Temp 98.9°F | Resp 16 | Ht 65.0 in | Wt 222.0 lb

## 2017-03-15 DIAGNOSIS — Z48812 Encounter for surgical aftercare following surgery on the circulatory system: Secondary | ICD-10-CM

## 2017-03-15 NOTE — Progress Notes (Signed)
Patient name: Tabitha Green MRN: [id number] DOB: 04-27-1946 Sex: female  REASON FOR VISIT:    Follow up after ligation of right upper arm fistula  HPI:   Tabitha Green is a pleasant 71 y.o. female who has a functioning renal transplant. She no longer uses her upper arm fistula and presented with several large aneurysms with compromise of the skin and risk of bleeding. This reason, she was taken to the operative room on 02/14/2017 and underwent ligation of her right upper arm fistula and excision of several large aneurysms of her right upper arm fistula. She comes in for follow up visit. She has no specific complaints except for some mild cellulitis in her upper arm well above her incisions.  Current Outpatient Prescriptions  Medication Sig Dispense Refill  . acetaminophen (TYLENOL) 500 MG tablet Take 500-750 mg by mouth every 6 (six) hours as needed for pain.    [name] aspirin EC 81 MG EC tablet Take 1 tablet (81 mg total) by mouth daily.    Skipper Cliche SALINE NASAL DROPS NA Place 2 sprays into both nostrils daily.    . Camphor-Eucalyptus-Menthol (VICKS VAPORUB EX) Apply 1 application topically daily.    [name] doxazosin (CARDURA) 2 MG tablet Take 2 mg by mouth 2 (two) times daily.     [name] levothyroxine (SYNTHROID, LEVOTHROID) 125 MCG tablet Take 1 tablet (125 mcg total) by mouth daily. 30 tablet 11  . loratadine (CLARITIN) 10 MG tablet Take 20 mg by mouth as needed for allergies.    [name] meclizine (ANTIVERT) 25 MG tablet Take 1 tablet (25 mg total) by mouth 3 (three) times daily as needed for dizziness or nausea. 20 tablet 0  . mycophenolate (MYFORTIC) 360 MG TBEC EC tablet Take 360 mg by mouth 2 (two) times daily.    . silver sulfADIAZINE (SILVADENE) 1 % cream Apply 1 application topically daily. Mixes with Triamcinolone cream    . SODIUM BICARBONATE PO Take 650 mg by mouth 2 (two) times daily. Takes 2 in AM and 2 at bedtime    . sulfamethoxazole-trimethoprim (BACTRIM,SEPTRA) 400-80 MG tablet Take 1 tablet  by mouth daily.    . tacrolimus (PROGRAF) 5 MG capsule Take 5 mg by mouth 2 (two) times daily.    [name] triamcinolone (NASACORT ALLERGY 24HR) 55 MCG/ACT AERO nasal inhaler Place 2 sprays into the nose daily.    [name] triamcinolone cream (KENALOG) 0.1 % Apply 1 application topically daily.    . verapamil (CALAN-SR) 240 MG CR tablet Take 240 mg by mouth daily.      No current facility-administered medications for this visit.     REVIEW OF SYSTEMS:  [X]  denotes positive finding, [ ]  denotes negative finding Cardiac  Comments:  Chest pain or chest pressure:    Shortness of breath upon exertion:    Short of breath when lying flat:    Irregular heart rhythm:    Constitutional    Fever or chills:     PHYSICAL EXAM:   Vitals:   03/15/17 0942  BP: 127/85  Pulse: 83  Resp: 16  Temp: 98.9 F (37.2 C)  TempSrc: Oral  SpO2: 96%  Weight: 222 lb (100.7 kg)  Height: 5\' 5"  (1.651 m)    GENERAL: The patient is a well-nourished female, in no acute distress. The vital signs are documented above. CARDIOVASCULAR: There is a regular rate and rhythm. PULMONARY: There is good air exchange bilaterally without wheezing or rales. Her incisions are all healing nicely. There is  some mild cellulitis in the upper arm well above her incisions. She has a palpable right radial pulse.  DATA:   No new data.  MEDICAL ISSUES:   STATUS POST EXCISION OF LARGE ANEURYSMS OF RIGHT UPPER ARM FISTULA: The patient is doing well postoperatively. Her incisions are all healing nicely. She has no complaints. I will see her back as needed.  Deitra Mayo [facility] [phone]

## 2017-03-24 DIAGNOSIS — R799 Abnormal finding of blood chemistry, unspecified: Secondary | ICD-10-CM | POA: Diagnosis not present

## 2017-03-24 DIAGNOSIS — Z940 Kidney transplant status: Secondary | ICD-10-CM | POA: Diagnosis not present

## 2017-03-24 DIAGNOSIS — Z79899 Other long term (current) drug therapy: Secondary | ICD-10-CM | POA: Diagnosis not present

## 2017-04-04 DIAGNOSIS — Z940 Kidney transplant status: Secondary | ICD-10-CM | POA: Diagnosis not present

## 2017-04-04 DIAGNOSIS — Z79899 Other long term (current) drug therapy: Secondary | ICD-10-CM | POA: Diagnosis not present

## 2017-04-04 DIAGNOSIS — R799 Abnormal finding of blood chemistry, unspecified: Secondary | ICD-10-CM | POA: Diagnosis not present

## 2017-04-11 DIAGNOSIS — Z79899 Other long term (current) drug therapy: Secondary | ICD-10-CM | POA: Diagnosis not present

## 2017-04-11 DIAGNOSIS — R799 Abnormal finding of blood chemistry, unspecified: Secondary | ICD-10-CM | POA: Diagnosis not present

## 2017-04-11 DIAGNOSIS — Z940 Kidney transplant status: Secondary | ICD-10-CM | POA: Diagnosis not present

## 2017-04-21 DIAGNOSIS — Z940 Kidney transplant status: Secondary | ICD-10-CM | POA: Diagnosis not present

## 2017-04-21 DIAGNOSIS — R799 Abnormal finding of blood chemistry, unspecified: Secondary | ICD-10-CM | POA: Diagnosis not present

## 2017-04-21 DIAGNOSIS — Z79899 Other long term (current) drug therapy: Secondary | ICD-10-CM | POA: Diagnosis not present

## 2017-05-08 DIAGNOSIS — R799 Abnormal finding of blood chemistry, unspecified: Secondary | ICD-10-CM | POA: Diagnosis not present

## 2017-05-08 DIAGNOSIS — Z940 Kidney transplant status: Secondary | ICD-10-CM | POA: Diagnosis not present

## 2017-05-08 DIAGNOSIS — Z79899 Other long term (current) drug therapy: Secondary | ICD-10-CM | POA: Diagnosis not present

## 2017-05-24 DIAGNOSIS — B259 Cytomegaloviral disease, unspecified: Secondary | ICD-10-CM | POA: Diagnosis not present

## 2017-05-24 DIAGNOSIS — Z940 Kidney transplant status: Secondary | ICD-10-CM | POA: Diagnosis not present

## 2017-05-29 DIAGNOSIS — Z79899 Other long term (current) drug therapy: Secondary | ICD-10-CM | POA: Diagnosis not present

## 2017-05-29 DIAGNOSIS — R799 Abnormal finding of blood chemistry, unspecified: Secondary | ICD-10-CM | POA: Diagnosis not present

## 2017-05-29 DIAGNOSIS — B259 Cytomegaloviral disease, unspecified: Secondary | ICD-10-CM | POA: Diagnosis not present

## 2017-05-29 DIAGNOSIS — Z940 Kidney transplant status: Secondary | ICD-10-CM | POA: Diagnosis not present

## 2017-05-30 ENCOUNTER — Ambulatory Visit: Payer: Medicare Other

## 2017-06-05 DIAGNOSIS — Z940 Kidney transplant status: Secondary | ICD-10-CM | POA: Diagnosis not present

## 2017-06-06 ENCOUNTER — Ambulatory Visit (INDEPENDENT_AMBULATORY_CARE_PROVIDER_SITE_OTHER): Payer: Medicare Other

## 2017-06-06 VITALS — BP 138/80 | HR 73 | Ht 65.0 in | Wt 228.0 lb

## 2017-06-06 DIAGNOSIS — Z0000 Encounter for general adult medical examination without abnormal findings: Secondary | ICD-10-CM

## 2017-06-06 DIAGNOSIS — Z23 Encounter for immunization: Secondary | ICD-10-CM

## 2017-06-06 MED ORDER — ZOSTER VAC RECOMB ADJUVANTED 50 MCG/0.5ML IM SUSR: 0.5000 mL | Freq: Once | INTRAMUSCULAR | 1 refills | Status: AC

## 2017-06-06 NOTE — Patient Instructions (Addendum)
Schedule mammogram.  Shingles vaccine at pharmacy.   Continue doing brain stimulating activities (puzzles, reading, adult coloring books, staying active) to keep memory sharp.    Fall Prevention in the Home Falls can cause injuries. They can happen to people of all ages. There are many things you can do to make your home safe and to help prevent falls. What can I do on the outside of my home?  Regularly fix the edges of walkways and driveways and fix any cracks.  Remove anything that might make you trip as you walk through a door, such as a raised step or threshold.  Trim any bushes or trees on the path to your home.  Use bright outdoor lighting.  Clear any walking paths of anything that might make someone trip, such as rocks or tools.  Regularly check to see if handrails are loose or broken. Make sure that both sides of any steps have handrails.  Any raised decks and porches should have guardrails on the edges.  Have any leaves, snow, or ice cleared regularly.  Use sand or salt on walking paths during winter.  Clean up any spills in your garage right away. This includes oil or grease spills. What can I do in the bathroom?  Use night lights.  Install grab bars by the toilet and in the tub and shower. Do not use towel bars as grab bars.  Use non-skid mats or decals in the tub or shower.  If you need to sit down in the shower, use a plastic, non-slip stool.  Keep the floor dry. Clean up any water that spills on the floor as soon as it happens.  Remove soap buildup in the tub or shower regularly.  Attach bath mats securely with double-sided non-slip rug tape.  Do not have throw rugs and other things on the floor that can make you trip. What can I do in the bedroom?  Use night lights.  Make sure that you have a light by your bed that is easy to reach.  Do not use any sheets or blankets that are too big for your bed. They should not hang down onto the floor.  Have a  firm chair that has side arms. You can use this for support while you get dressed.  Do not have throw rugs and other things on the floor that can make you trip. What can I do in the kitchen?  Clean up any spills right away.  Avoid walking on wet floors.  Keep items that you use a lot in easy-to-reach places.  If you need to reach something above you, use a strong step stool that has a grab bar.  Keep electrical cords out of the way.  Do not use floor polish or wax that makes floors slippery. If you must use wax, use non-skid floor wax.  Do not have throw rugs and other things on the floor that can make you trip. What can I do with my stairs?  Do not leave any items on the stairs.  Make sure that there are handrails on both sides of the stairs and use them. Fix handrails that are broken or loose. Make sure that handrails are as long as the stairways.  Check any carpeting to make sure that it is firmly attached to the stairs. Fix any carpet that is loose or worn.  Avoid having throw rugs at the top or bottom of the stairs. If you do have throw rugs, attach them to the  floor with carpet tape.  Make sure that you have a light switch at the top of the stairs and the bottom of the stairs. If you do not have them, ask someone to add them for you. What else can I do to help prevent falls?  Wear shoes that: ? Do not have high heels. ? Have rubber bottoms. ? Are comfortable and fit you well. ? Are closed at the toe. Do not wear sandals.  If you use a stepladder: ? Make sure that it is fully opened. Do not climb a closed stepladder. ? Make sure that both sides of the stepladder are locked into place. ? Ask someone to hold it for you, if possible.  Clearly mark and make sure that you can see: ? Any grab bars or handrails. ? First and last steps. ? Where the edge of each step is.  Use tools that help you move around (mobility aids) if they are needed. These  include: ? Canes. ? Walkers. ? Scooters. ? Crutches.  Turn on the lights when you go into a dark area. Replace any light bulbs as soon as they burn out.  Set up your furniture so you have a clear path. Avoid moving your furniture around.  If any of your floors are uneven, fix them.  If there are any pets around you, be aware of where they are.  Review your medicines with your doctor. Some medicines can make you feel dizzy. This can increase your chance of falling. Ask your doctor what other things that you can do to help prevent falls. This information is not intended to replace advice given to you by your health care provider. Make sure you discuss any questions you have with your health care provider. Document Released: 05/21/2009 Document Revised: 12/31/2015 Document Reviewed: 08/29/2014 Elsevier Interactive Patient Education  2018 [place] Maintenance, Female Adopting a healthy lifestyle and getting preventive care can go a long way to promote health and wellness. Talk with your health care provider about what schedule of regular examinations is right for you. This is a good chance for you to check in with your provider about disease prevention and staying healthy. In between checkups, there are plenty of things you can do on your own. Experts have done a lot of research about which lifestyle changes and preventive measures are most likely to keep you healthy. Ask your health care provider for more information. Weight and diet Eat a healthy diet  Be sure to include plenty of vegetables, fruits, low-fat dairy products, and lean protein.  Do not eat a lot of foods high in solid fats, added sugars, or salt.  Get regular exercise. This is one of the most important things you can do for your health. ? Most adults should exercise for at least 150 minutes each week. The exercise should increase your heart rate and make you sweat (moderate-intensity exercise). ? Most adults  should also do strengthening exercises at least twice a week. This is in addition to the moderate-intensity exercise.  Maintain a healthy weight  Body mass index (BMI) is a measurement that can be used to identify possible weight problems. It estimates body fat based on height and weight. Your health care provider can help determine your BMI and help you achieve or maintain a healthy weight.  For females 30 years of age and older: ? A BMI below 18.5 is considered underweight. ? A BMI of 18.5 to 24.9 is normal. ? A BMI of  25 to 29.9 is considered overweight. ? A BMI of 30 and above is considered obese.  Watch levels of cholesterol and blood lipids  You should start having your blood tested for lipids and cholesterol at 71 years of age, then have this test every 5 years.  You may need to have your cholesterol levels checked more often if: ? Your lipid or cholesterol levels are high. ? You are older than 71 years of age. ? You are at high risk for heart disease.  Cancer screening Lung Cancer  Lung cancer screening is recommended for adults 33-106 years old who are at high risk for lung cancer because of a history of smoking.  A yearly low-dose CT scan of the lungs is recommended for people who: ? Currently smoke. ? Have quit within the past 15 years. ? Have at least a 30-pack-year history of smoking. A pack year is smoking an average of one pack of cigarettes a day for 1 year.  Yearly screening should continue until it has been 15 years since you quit.  Yearly screening should stop if you develop a health problem that would prevent you from having lung cancer treatment.  Breast Cancer  Practice breast self-awareness. This means understanding how your breasts normally appear and feel.  It also means doing regular breast self-exams. Let your health care provider know about any changes, no matter how small.  If you are in your 20s or 30s, you should have a clinical breast exam (CBE)  by a health care provider every 1-3 years as part of a regular health exam.  If you are 3 or older, have a CBE every year. Also consider having a breast X-ray (mammogram) every year.  If you have a family history of breast cancer, talk to your health care provider about genetic screening.  If you are at high risk for breast cancer, talk to your health care provider about having an MRI and a mammogram every year.  Breast cancer gene (BRCA) assessment is recommended for women who have family members with BRCA-related cancers. BRCA-related cancers include: ? Breast. ? Ovarian. ? Tubal. ? Peritoneal cancers.  Results of the assessment will determine the need for genetic counseling and BRCA1 and BRCA2 testing.  Cervical Cancer Your health care provider may recommend that you be screened regularly for cancer of the pelvic organs (ovaries, uterus, and vagina). This screening involves a pelvic examination, including checking for microscopic changes to the surface of your cervix (Pap test). You may be encouraged to have this screening done every 3 years, beginning at age 44.  For women ages 75-65, health care providers may recommend pelvic exams and Pap testing every 3 years, or they may recommend the Pap and pelvic exam, combined with testing for human papilloma virus (HPV), every 5 years. Some types of HPV increase your risk of cervical cancer. Testing for HPV may also be done on women of any age with unclear Pap test results.  Other health care providers may not recommend any screening for nonpregnant women who are considered low risk for pelvic cancer and who do not have symptoms. Ask your health care provider if a screening pelvic exam is right for you.  If you have had past treatment for cervical cancer or a condition that could lead to cancer, you need Pap tests and screening for cancer for at least 20 years after your treatment. If Pap tests have been discontinued, your risk factors (such as  having a new sexual partner)  need to be reassessed to determine if screening should resume. Some women have medical problems that increase the chance of getting cervical cancer. In these cases, your health care provider may recommend more frequent screening and Pap tests.  Colorectal Cancer  This type of cancer can be detected and often prevented.  Routine colorectal cancer screening usually begins at 71 years of age and continues through 71 years of age.  Your health care provider may recommend screening at an earlier age if you have risk factors for colon cancer.  Your health care provider may also recommend using home test kits to check for hidden blood in the stool.  A small camera at the end of a tube can be used to examine your colon directly (sigmoidoscopy or colonoscopy). This is done to check for the earliest forms of colorectal cancer.  Routine screening usually begins at age 65.  Direct examination of the colon should be repeated every 5-10 years through 71 years of age. However, you may need to be screened more often if early forms of precancerous polyps or small growths are found.  Skin Cancer  Check your skin from head to toe regularly.  Tell your health care provider about any new moles or changes in moles, especially if there is a change in a mole's shape or color.  Also tell your health care provider if you have a mole that is larger than the size of a pencil eraser.  Always use sunscreen. Apply sunscreen liberally and repeatedly throughout the day.  Protect yourself by wearing long sleeves, pants, a wide-brimmed hat, and sunglasses whenever you are outside.  Heart disease, diabetes, and high blood pressure  High blood pressure causes heart disease and increases the risk of stroke. High blood pressure is more likely to develop in: ? People who have blood pressure in the high end of the normal range (130-139/85-89 mm Hg). ? People who are overweight or  obese. ? People who are African American.  If you are 52-25 years of age, have your blood pressure checked every 3-5 years. If you are 7 years of age or older, have your blood pressure checked every year. You should have your blood pressure measured twice-once when you are at a hospital or clinic, and once when you are not at a hospital or clinic. Record the average of the two measurements. To check your blood pressure when you are not at a hospital or clinic, you can use: ? An automated blood pressure machine at a pharmacy. ? A home blood pressure monitor.  If you are between 29 years and 53 years old, ask your health care provider if you should take aspirin to prevent strokes.  Have regular diabetes screenings. This involves taking a blood sample to check your fasting blood sugar level. ? If you are at a normal weight and have a low risk for diabetes, have this test once every three years after 71 years of age. ? If you are overweight and have a high risk for diabetes, consider being tested at a younger age or more often. Preventing infection Hepatitis B  If you have a higher risk for hepatitis B, you should be screened for this virus. You are considered at high risk for hepatitis B if: ? You were born in a country where hepatitis B is common. Ask your health care provider which countries are considered high risk. ? Your parents were born in a high-risk country, and you have not been immunized against hepatitis B (  hepatitis B vaccine). ? You have HIV or AIDS. ? You use needles to inject street drugs. ? You live with someone who has hepatitis B. ? You have had sex with someone who has hepatitis B. ? You get hemodialysis treatment. ? You take certain medicines for conditions, including cancer, organ transplantation, and autoimmune conditions.  Hepatitis C  Blood testing is recommended for: ? Everyone born from [birth year] through 1965. ? Anyone with known risk factors for hepatitis  C.  Sexually transmitted infections (STIs)  You should be screened for sexually transmitted infections (STIs) including gonorrhea and chlamydia if: ? You are sexually active and are younger than 71 years of age. ? You are older than 71 years of age and your health care provider tells you that you are at risk for this type of infection. ? Your sexual activity has changed since you were last screened and you are at an increased risk for chlamydia or gonorrhea. Ask your health care provider if you are at risk.  If you do not have HIV, but are at risk, it may be recommended that you take a prescription medicine daily to prevent HIV infection. This is called pre-exposure prophylaxis (PrEP). You are considered at risk if: ? You are sexually active and do not regularly use condoms or know the HIV status of your partner(s). ? You take drugs by injection. ? You are sexually active with a partner who has HIV.  Talk with your health care provider about whether you are at high risk of being infected with HIV. If you choose to begin PrEP, you should first be tested for HIV. You should then be tested every 3 months for as long as you are taking PrEP. Pregnancy  If you are premenopausal and you may become pregnant, ask your health care provider about preconception counseling.  If you may become pregnant, take 400 to 800 micrograms (mcg) of folic acid every day.  If you want to prevent pregnancy, talk to your health care provider about birth control (contraception). Osteoporosis and menopause  Osteoporosis is a disease in which the bones lose minerals and strength with aging. This can result in serious bone fractures. Your risk for osteoporosis can be identified using a bone density scan.  If you are 54 years of age or older, or if you are at risk for osteoporosis and fractures, ask your health care provider if you should be screened.  Ask your health care provider whether you should take a calcium or  vitamin D supplement to lower your risk for osteoporosis.  Menopause may have certain physical symptoms and risks.  Hormone replacement therapy may reduce some of these symptoms and risks. Talk to your health care provider about whether hormone replacement therapy is right for you. Follow these instructions at home:  Schedule regular health, dental, and eye exams.  Stay current with your immunizations.  Do not use any tobacco products including cigarettes, chewing tobacco, or electronic cigarettes.  If you are pregnant, do not drink alcohol.  If you are breastfeeding, limit how much and how often you drink alcohol.  Limit alcohol intake to no more than 1 drink per day for nonpregnant women. One drink equals 12 ounces of beer, 5 ounces of wine, or 1 ounces of hard liquor.  Do not use street drugs.  Do not share needles.  Ask your health care provider for help if you need support or information about quitting drugs.  Tell your health care provider if you often feel  depressed.  Tell your health care provider if you have ever been abused or do not feel safe at home. This information is not intended to replace advice given to you by your health care provider. Make sure you discuss any questions you have with your health care provider. Document Released: 02/07/2011 Document Revised: 12/31/2015 Document Reviewed: 04/28/2015 Elsevier Interactive Patient Education  [employer].

## 2017-06-06 NOTE — Progress Notes (Signed)
Subjective:   Tabitha Green is a 71 y.o. female who presents for Medicare Annual (Subsequent) preventive examination.  Review of Systems:  No ROS.  Medicare Wellness Visit. Additional risk factors are reflected in the social history.  Cardiac Risk Factors include: advanced age (>5men, >78 women);dyslipidemia;hypertension;obesity (BMI >30kg/m2);family history of premature cardiovascular disease;smoking/ tobacco exposure;sedentary lifestyle   Sleep patterns: Sleeps 4-5 hours, does not feel rested. Often watches TV throughout the night.  Home Safety/Smoke Alarms: Feels safe in home. Smoke alarms in place.  Living environment; residence and Firearm Safety: Lives with husband in 1 story home.  Seat Belt Safety/Bike Helmet: Wears seat belt.   Female:   [id number]       Mammo-06/07/2016, negative. Patient will schedule.      Dexa scan-06/07/2016, osteoporosis.         CCS-Colonoscopy 06/07/2012, normal.      Objective:     Vitals: BP 138/80 (BP Location: Left Arm, Patient Position: Sitting, Cuff Size: Large)   Pulse 73   Ht 5\' 5"  (1.651 m)   Wt 228 lb (103.4 kg)   SpO2 96%   BMI 37.94 kg/m   Body mass index is 37.94 kg/m.   Tobacco History  Smoking Status  . Never Smoker  Smokeless Tobacco  . Never Used     Counseling given: Not Answered   Past Medical History:  Diagnosis Date  . Anemia of chronic kidney failure    r/t kidney function- receives Procrit  . Anxiety   . Arthritis   . Cancer [facility])    kidney cancer, left nephrectomy  . Chronic renal insufficiency, stage III (moderate) (HCC) 10/2015   Post-transplant Cr 1.3, GFR 41 as of June 2017 f/u w/ [facility]  . CMV infection [facility]) summer 2017   Valcyte per ID/Renal transplant team  . Diverticulosis    a. 05/2012 colonoscopy  . Erosive lichen planus of vulva   . ESRD (end stage renal disease) ([place])    began dialysis 2014--followed by Dr. [name].  Received deceased donor kidney transplant  10/2015.  [name] FSGS (focal segmental glomerulosclerosis)    right kidney; hx of left renal cell cancer and got nephrectomy 1993.  [name] GERD (gastroesophageal reflux disease)   . Gout   . Heart murmur    (Diastolic) ECHO 02/9023 showed that this murmur is coming from pt's R arm AV fistula  . 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  . Hiatal hernia   . History of renal cell cancer 1993   Left nephrectomy  . Hyperlipidemia   . Hypertension   . Hypothyroidism   . Impingement syndrome of left shoulder 04/2016   Dr. Christy Sartorius to PT  . Osteopenia   . Osteoporosis 06/07/2016   DEXA T-score of -3.1.  Fosamax started 06/2016.  Repeat DEXA 2 yrs.  . Renal transplant recipient 11/06/15  . Renal transplant recipient   . SVT (supraventricular tachycardia) (HCC)    Past Surgical History:  Procedure Laterality Date  . AV FISTULA PLACEMENT Right    Right arm: aneurismal dilatation 2017 being followed by CV surgeons  . CARDIOVASCULAR STRESS TEST     03/10/15 ETT ([facility]): Exercise ECG negative at 83% max predicted HR.   [name] COLONOSCOPY  2003; 05/2012   diverticulosis, no polyps.  Recall 10 yrs  . colonoscopy with polypectomy     Dr Henrene Pastor  . DEXA  06/07/2016   T-score -3.1  . KIDNEY TRANSPLANT  2015/12/01   Deceased donor kidney transplant, with Thymoglobulin induction  . LIGATION OF ARTERIOVENOUS  FISTULA Right 02/14/2017   Procedure: LIGATION OF ARTERIOVENOUS  FISTULA;  Surgeon: Angelia Mould, MD;  Location: [place];  Service: Vascular;  Laterality: Right;  . NEPHRECTOMY  1993   for malignancy- left  . RENAL BIOPSY     right  . RESECTION OF ARTERIOVENOUS FISTULA ANEURYSM Right 02/14/2017   Procedure: EXCISION OF RIGHT BRACHIOCEPHALIC ARTERIOVENOUS FISTULA ANEURYSM;  Surgeon: Angelia Mould, MD;  Location: [place];  Service: Vascular;  Laterality:  Right;  . TEE WITHOUT CARDIOVERSION N/A 08/27/2013   Procedure: TRANSESOPHAGEAL ECHOCARDIOGRAM (TEE);  Surgeon: [name], MD;  Location: [facility] ENDOSCOPY;  Service: Cardiovascular;  Laterality: N/A;  . TOTAL ABDOMINAL HYSTERECTOMY W/ BILATERAL SALPINGOOPHORECTOMY  1997   fibroids  . TRANSTHORACIC ECHOCARDIOGRAM     03/10/15 echo ([facility]): LV cavity normal in size, focal basal hypertrophy, normal systolic function, EF 16% (visual est). Normal wall motion, no regional wall motion abnormalities. Mild diastolic dysfunction with normal LA chamber size. No significant valve stenosis or regurgitation.  . VULVA / PERINEUM BIOPSY  2015   Family History  Problem Relation Age of Onset  . Deep vein thrombosis Mother        post thyroid surgery  . Hypertension Mother   . Heart attack Father 56       deceased  . Hypertension Father   . Cancer Sister        breast  . Cancer Paternal Aunt        pancreatic  . Diabetes Maternal Grandfather   . Stroke Paternal Grandmother        in 73s  . Colon cancer Neg Hx   . Esophageal cancer Neg Hx   . Stomach cancer Neg Hx   . Rectal cancer Neg Hx    History  Sexual Activity  . Sexual activity: Not Currently  . Birth control/ protection: Surgical    Outpatient Encounter Prescriptions as of 06/06/2017  Medication Sig  . acetaminophen (TYLENOL) 500 MG tablet Take 500-750 mg by mouth every 6 (six) hours as needed for pain.  [name] aspirin EC 81 MG EC tablet Take 1 tablet (81 mg total) by mouth daily.  Skipper Cliche SALINE NASAL DROPS NA Place 2 sprays into both nostrils daily.  . Camphor-Eucalyptus-Menthol (VICKS VAPORUB EX) Apply 1 application topically daily.  [name] doxazosin (CARDURA) 2 MG tablet Take 2 mg by mouth 2 (two) times daily.   [name] levothyroxine (SYNTHROID, LEVOTHROID) 125 MCG tablet Take 1 tablet (125 mcg total) by mouth daily.  [name] loratadine (CLARITIN) 10 MG tablet Take 20 mg by mouth as needed for allergies.  [name] meclizine (ANTIVERT) 25  MG tablet Take 1 tablet (25 mg total) by mouth 3 (three) times daily as needed for dizziness or nausea.  . mycophenolate (MYFORTIC) 360 MG TBEC EC tablet Take 360 mg by mouth 2 (two) times daily.  . silver sulfADIAZINE (SILVADENE) 1 % cream Apply 1 application topically daily. Mixes with Triamcinolone cream  . SODIUM BICARBONATE PO Take 650 mg by mouth 2 (two) times daily. Takes 2 in AM and 2 at bedtime  . sulfamethoxazole-trimethoprim (BACTRIM,SEPTRA) 400-80 MG tablet Take 1 tablet by mouth daily.  . tacrolimus (PROGRAF) 5 MG capsule Take 5 mg by mouth 2 (two) times daily.  [name] triamcinolone (NASACORT ALLERGY 24HR) 55 MCG/ACT AERO nasal inhaler Place 2 sprays  into the nose daily.  [name] triamcinolone cream (KENALOG) 0.1 % Apply 1 application topically daily.  . verapamil (CALAN-SR) 240 MG CR tablet Take 240 mg by mouth daily.   . valGANciclovir (VALCYTE) 450 MG tablet Take by mouth as needed.  . Zoster Vaccine Adjuvanted [facility]) injection Inject 0.5 mLs into the muscle once.   No facility-administered encounter medications on file as of 06/06/2017.     Activities of Daily Living In your present state of health, do you have any difficulty performing the following activities: 06/06/2017  Hearing? N  Vision? N  Difficulty concentrating or making decisions? N  Walking or climbing stairs? N  Dressing or bathing? N  Doing errands, shopping? N  Preparing Food and eating ? N  Using the Toilet? N  In the past six months, have you accidently leaked urine? N  Do you have problems with loss of bowel control? N  Managing your Medications? N  Managing your Finances? N  Housekeeping or managing your Housekeeping? N  Some recent data might be hidden    Patient Care Team: Tammi Sou, MD as PCP - General (Family Medicine) Irene Shipper, MD as Consulting Physician (Gastroenterology) Estanislado Emms, MD as Consulting Physician (Nephrology) Dene Gentry, MD as Consulting Physician (Sports  Medicine) Elam Dutch, MD as Consulting Physician (Vascular Surgery) Angelia Mould, MD as Consulting Physician (Vascular Surgery)    Assessment:    Physical assessment deferred to PCP.  Exercise Activities and Dietary recommendations Current Exercise Habits: The patient does not participate in regular exercise at present, Exercise limited by: None identified   Diet (meal preparation, eat out, water intake, caffeinated beverages, dairy products, fruits and vegetables): Drinks tea and water (occasionally)  Breakfast: eggs, toast, english muffin Lunch: "out to eat" Dinner: Eats out a lot, tries to eat protein and vegetables.   Discussed heart healthy diet.   Goals    . Increase physical activity          Increase activity by starting exercise class at church (silver sneakers).       Fall Risk Fall Risk  06/06/2017 04/18/2016 04/18/2016 11/05/2015 07/23/2013  Falls in the past year? No No No Yes Yes  Number falls in past yr: - - - 1 1  Injury with Fall? - - - No No   Depression Screen PHQ 2/9 Scores 06/06/2017 04/18/2016 04/18/2016 11/05/2015  PHQ - 2 Score 0 0 1 0     Cognitive Function MMSE - Mini Mental State Exam 06/06/2017  Orientation to time 5  Orientation to Place 5  Registration 3  Attention/ Calculation 5  Recall 3  Language- name 2 objects 2  Language- repeat 1  Language- follow 3 step command 3  Language- read & follow direction 1  Write a sentence 1  Copy design 1  Total score 30        Immunization History  Administered Date(s) Administered  . Influenza Split 06/01/2011  . Influenza Whole 06/11/2008, 05/25/2009, 06/09/2010  . Influenza, High Dose Seasonal PF 05/08/2013  . Influenza-Unspecified 05/18/2015  . PPD Test 06/01/2011  . Pneumococcal Polysaccharide-23 01/06/2013  . Tdap 09/13/2011  . Varicella 12/24/2013  . Zoster 01/23/2014   Screening Tests Health Maintenance  Topic Date Due  . PNA vac Low Risk Adult (2 of 2 - PCV13)  01/06/2014  . INFLUENZA VACCINE  03/08/2017  . MAMMOGRAM  06/07/2018  . TETANUS/TDAP  09/12/2021  . COLONOSCOPY  06/07/2022  . DEXA SCAN  Completed  .  Hepatitis C Screening  Completed      Plan:    Schedule mammogram.  Shingles vaccine at pharmacy.   Continue doing brain stimulating activities (puzzles, reading, adult coloring books, staying active) to keep memory sharp.    I have personally reviewed and noted the following in the patient's chart:   . Medical and social history . Use of alcohol, tobacco or illicit drugs  . Current medications and supplements . Functional ability and status . Nutritional status . Physical activity . Advanced directives . List of other physicians . Hospitalizations, surgeries, and ER visits in previous 12 months . Vitals . Screenings to include cognitive, depression, and falls . Referrals and appointments  In addition, I have reviewed and discussed with patient certain preventive protocols, quality metrics, and best practice recommendations. A written personalized care plan for preventive services as well as general preventive health recommendations were provided to patient.     Gerilyn Nestle, RN  06/06/2017  PCP Notes: -F/U with PCP for CPE 06/28/17

## 2017-06-09 DIAGNOSIS — L438 Other lichen planus: Secondary | ICD-10-CM | POA: Diagnosis not present

## 2017-06-09 DIAGNOSIS — L304 Erythema intertrigo: Secondary | ICD-10-CM | POA: Diagnosis not present

## 2017-06-12 ENCOUNTER — Encounter: Payer: Self-pay | Admitting: Family Medicine

## 2017-06-14 NOTE — Progress Notes (Signed)
AWV reviewed and agree.  Signed:  Crissie Sickles, MD           06/14/2017

## 2017-06-20 DIAGNOSIS — R799 Abnormal finding of blood chemistry, unspecified: Secondary | ICD-10-CM | POA: Diagnosis not present

## 2017-06-20 DIAGNOSIS — Z940 Kidney transplant status: Secondary | ICD-10-CM | POA: Diagnosis not present

## 2017-06-20 DIAGNOSIS — Z79899 Other long term (current) drug therapy: Secondary | ICD-10-CM | POA: Diagnosis not present

## 2017-06-21 ENCOUNTER — Other Ambulatory Visit: Payer: Self-pay | Admitting: Family Medicine

## 2017-06-21 DIAGNOSIS — Z1231 Encounter for screening mammogram for malignant neoplasm of breast: Secondary | ICD-10-CM

## 2017-06-22 ENCOUNTER — Ambulatory Visit (INDEPENDENT_AMBULATORY_CARE_PROVIDER_SITE_OTHER): Payer: Medicare Other

## 2017-06-22 DIAGNOSIS — Z1231 Encounter for screening mammogram for malignant neoplasm of breast: Secondary | ICD-10-CM | POA: Diagnosis not present

## 2017-06-28 ENCOUNTER — Encounter: Payer: Medicare Other | Admitting: Family Medicine

## 2017-07-03 DIAGNOSIS — Z940 Kidney transplant status: Secondary | ICD-10-CM | POA: Diagnosis not present

## 2017-07-03 DIAGNOSIS — Z79899 Other long term (current) drug therapy: Secondary | ICD-10-CM | POA: Diagnosis not present

## 2017-07-03 DIAGNOSIS — R799 Abnormal finding of blood chemistry, unspecified: Secondary | ICD-10-CM | POA: Diagnosis not present

## 2017-07-24 DIAGNOSIS — R799 Abnormal finding of blood chemistry, unspecified: Secondary | ICD-10-CM | POA: Diagnosis not present

## 2017-07-24 DIAGNOSIS — Z940 Kidney transplant status: Secondary | ICD-10-CM | POA: Diagnosis not present

## 2017-07-24 DIAGNOSIS — Z79899 Other long term (current) drug therapy: Secondary | ICD-10-CM | POA: Diagnosis not present

## 2017-08-08 DIAGNOSIS — M1712 Unilateral primary osteoarthritis, left knee: Secondary | ICD-10-CM

## 2017-08-08 HISTORY — DX: Unilateral primary osteoarthritis, left knee: M17.12

## 2017-08-10 ENCOUNTER — Ambulatory Visit (INDEPENDENT_AMBULATORY_CARE_PROVIDER_SITE_OTHER): Payer: Medicare Other | Admitting: Family Medicine

## 2017-08-10 ENCOUNTER — Encounter: Payer: Self-pay | Admitting: Family Medicine

## 2017-08-10 ENCOUNTER — Other Ambulatory Visit: Payer: Self-pay | Admitting: Family Medicine

## 2017-08-10 ENCOUNTER — Ambulatory Visit (INDEPENDENT_AMBULATORY_CARE_PROVIDER_SITE_OTHER): Payer: Medicare Other

## 2017-08-10 VITALS — BP 149/88 | HR 77 | Temp 98.5°F | Resp 16 | Ht 65.0 in | Wt 224.5 lb

## 2017-08-10 DIAGNOSIS — M25562 Pain in left knee: Secondary | ICD-10-CM | POA: Diagnosis not present

## 2017-08-10 DIAGNOSIS — N183 Chronic kidney disease, stage 3 unspecified: Secondary | ICD-10-CM

## 2017-08-10 DIAGNOSIS — I739 Peripheral vascular disease, unspecified: Secondary | ICD-10-CM | POA: Diagnosis not present

## 2017-08-10 DIAGNOSIS — M222X2 Patellofemoral disorders, left knee: Secondary | ICD-10-CM

## 2017-08-10 DIAGNOSIS — E039 Hypothyroidism, unspecified: Secondary | ICD-10-CM | POA: Diagnosis not present

## 2017-08-10 DIAGNOSIS — M179 Osteoarthritis of knee, unspecified: Secondary | ICD-10-CM | POA: Diagnosis not present

## 2017-08-10 LAB — TSH: TSH: 3.27 u[IU]/mL (ref 0.35–4.50)

## 2017-08-10 LAB — LIPID PANEL
CHOL/HDL RATIO: 4
Cholesterol: 150 mg/dL (ref 0–200)
HDL: 40.3 mg/dL (ref >39.00)
LDL CALC: 85 mg/dL (ref 0–99)
NONHDL: 109.75
Triglycerides: 122 mg/dL (ref 0.0–149.0)
VLDL: 24.4 mg/dL (ref 0.0–40.0)

## 2017-08-10 IMAGING — DX DG KNEE COMPLETE 4+V*L*
4 series · 4 of 4 positions shown · non-contrast
Comparison: No recent prior .

CLINICAL DATA: Left knee pain for 1 month.

EXAM:
LEFT KNEE - COMPLETE 4+ VIEW

[knee ap]
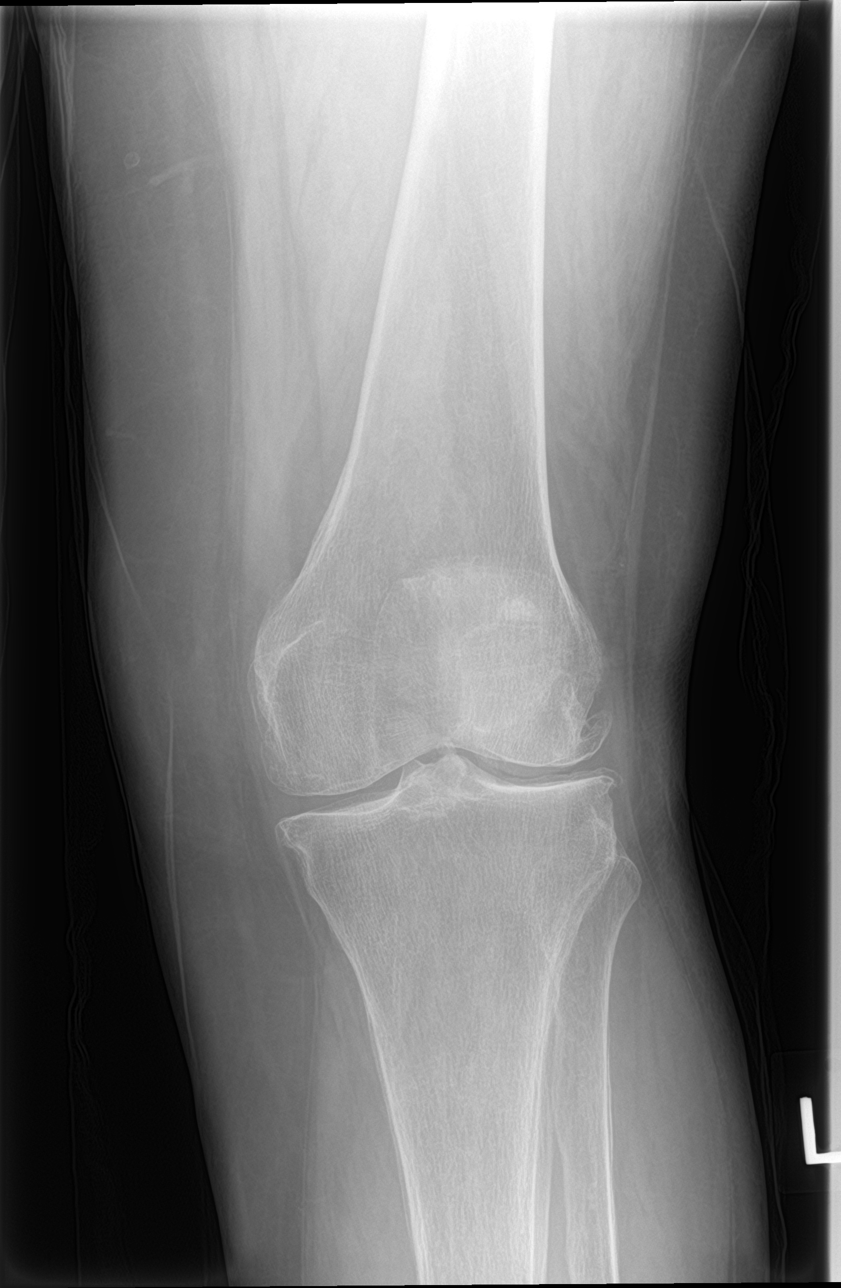

[knee lat]
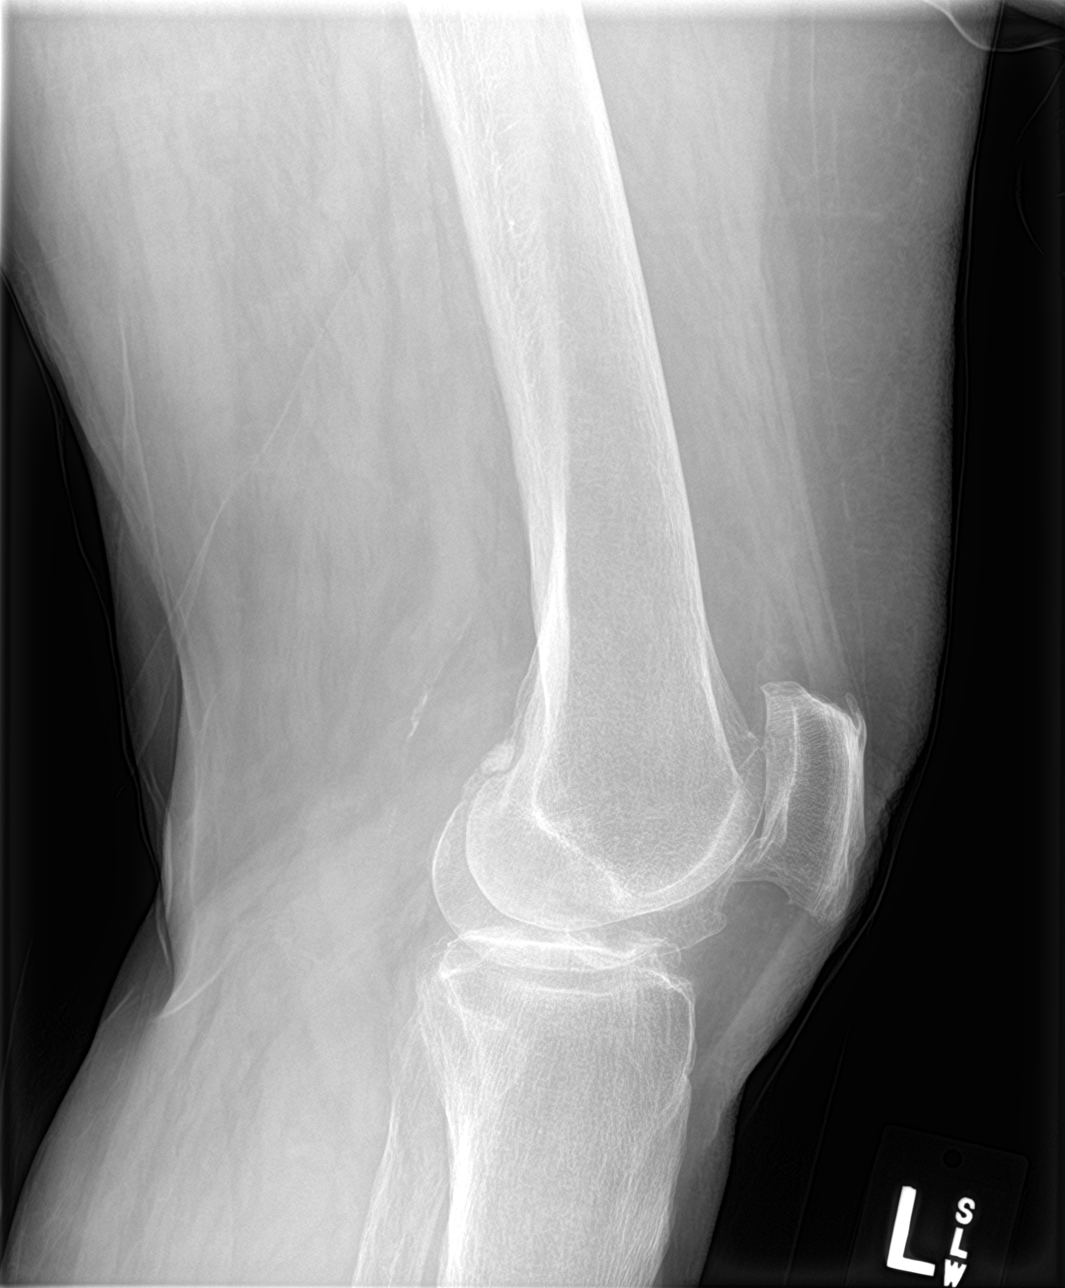

[knee obl (1 of 2)]
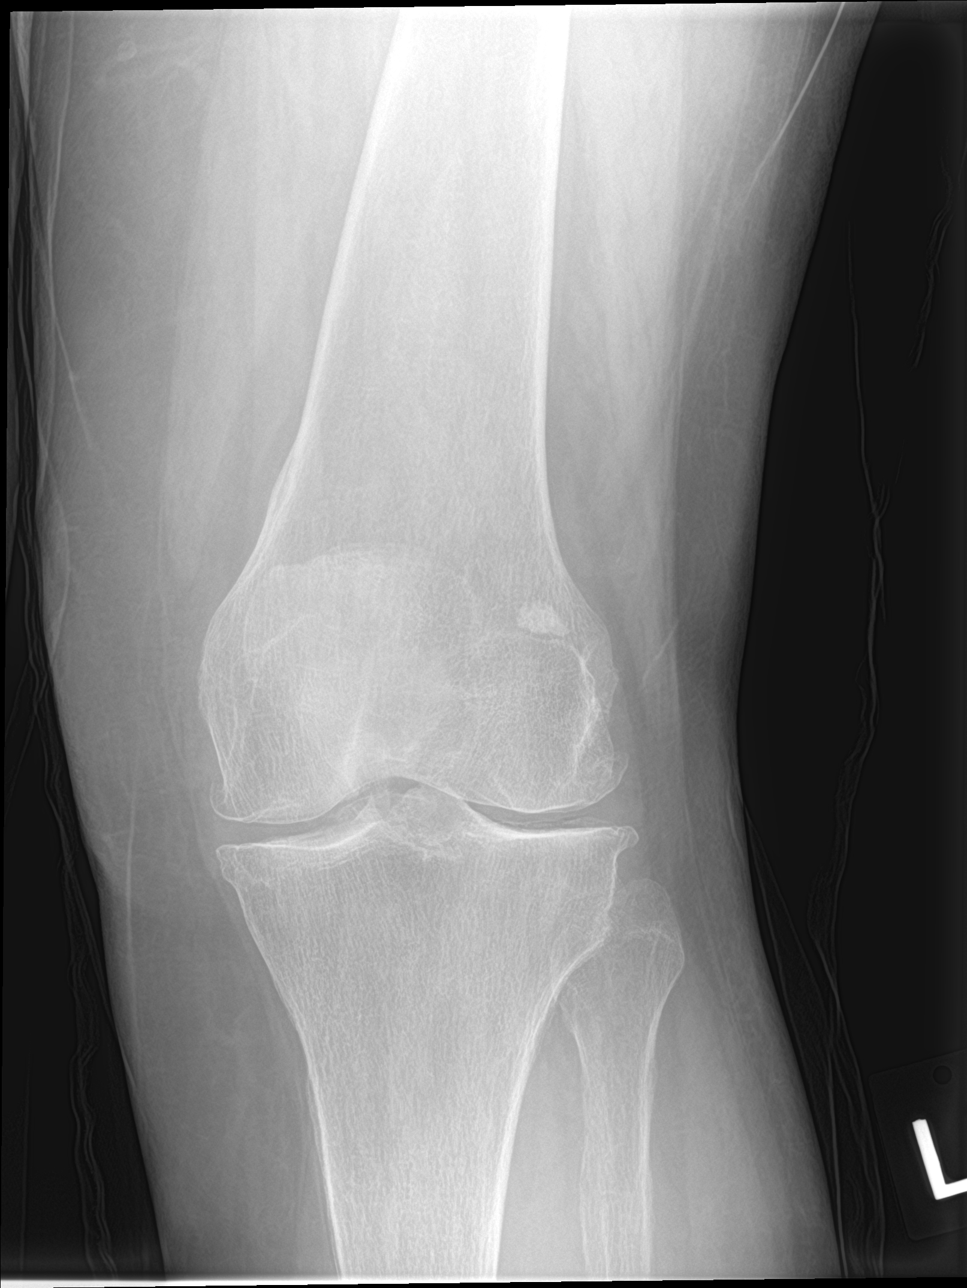

[knee obl (2 of 2)]
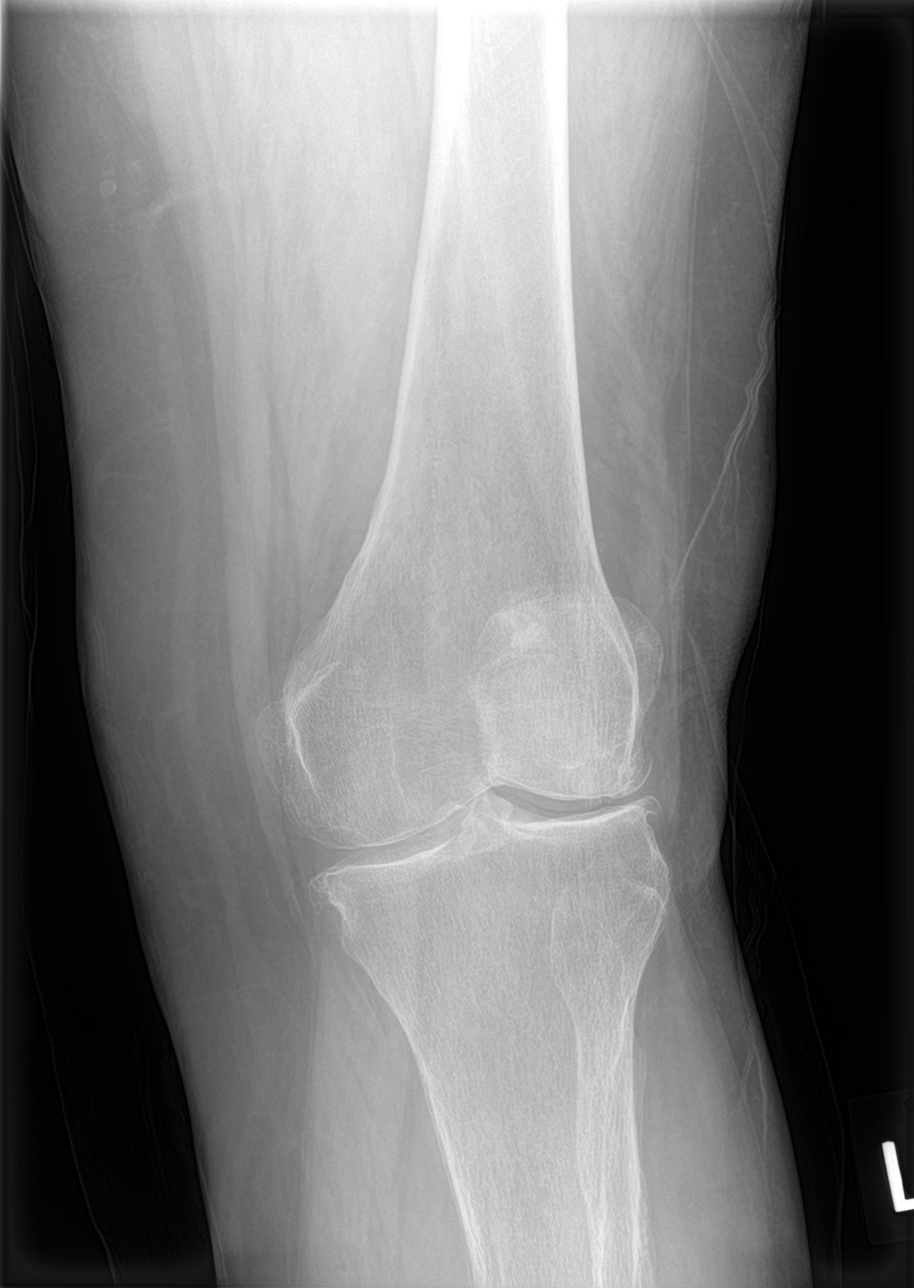

[4 of 4 positions shown; findings below may reference images not displayed]

FINDINGS: Diffuse severe tricompartment degenerative change left knee.
Prominent rounded calcific density is noted over the posterior
aspect of the distal femur. This could represent a loose body in the
popliteal space. This could represent a small bony fragment possibly
from prior injury. Tibial plateau is intact. No prominent fusion.
Peripheral vascular calcification.
IMPRESSION: 1. Diffuse severe tricompartment degenerative change left knee.
Prominent rounded calcific densities noted of the posterior aspect
of the distal femur. This may represent a loose body in the
popliteal space. This could represent a small bone fragment from
prior injury. No acute appearing bony abnormality identified.

2. Peripheral vascular disease.

## 2017-08-10 NOTE — Patient Instructions (Signed)
Patellofemoral Pain Syndrome Patellofemoral pain syndrome is a condition that involves a softening or breakdown of the tissue (cartilage) on the underside of your kneecap (patella). This causes pain in the front of the knee. The condition is also called runner's knee or chondromalacia patella. Patellofemoral pain syndrome is most common in young adults who are active in sports. Your knee is the largest joint in your body. The patella covers the front of your knee and is attached to muscles above and below your knee. The underside of the patella is covered with a smooth type of cartilage (synovium). The smooth surface helps the patella glide easily when you move your knee. Patellofemoral pain syndrome causes swelling in the joint linings and bone surfaces in your knee. What are the causes? Patellofemoral pain syndrome can be caused by:  Overuse.  Poor alignment of your knee joints.  Weak leg muscles.  A direct blow to your kneecap.  What increases the risk? You may be at risk for patellofemoral pain syndrome if you:  Do a lot of activities that can wear down your kneecap. These include: ? Running. ? Squatting. ? Climbing stairs.  Start a new physical activity or exercise program.  Wear shoes that do not fit well.  Do not have good leg strength.  Are overweight.  What are the signs or symptoms? Knee pain is the most common symptom of patellofemoral pain syndrome. This may feel like a dull, aching pain underneath your patella, in the front of your knee. There may be a popping or cracking sound when you move your knee. Pain may get worse with:  Exercise.  Climbing stairs.  Running.  Jumping.  Squatting.  Kneeling.  Sitting for a long time.  Moving or pushing on your patella.  How is this diagnosed? Your health care provider may be able to diagnose patellofemoral pain syndrome from your symptoms and medical history. You may be asked about your recent physical activities  and which ones cause knee pain. Your health care provider may do a physical exam with certain tests to confirm the diagnosis. These may include:  Moving your patella back and forth.  Checking your range of knee motion.  Having you squat or jump to see if you have pain.  Checking the strength of your leg muscles.  An MRI of the knee may also be done. How is this treated? Patellofemoral pain syndrome can usually be treated at home with rest, ice, compression, and elevation (RICE). Other treatments may include:  Nonsteroidal anti-inflammatory drugs (NSAIDs).  Physical therapy to stretch and strengthen your leg muscles.  Shoe inserts (orthotics) to take stress off your knee.  A knee brace or knee support.  Surgery to remove damaged cartilage or move the patella to a better position. The need for surgery is rare.  Follow these instructions at home:  Take medicines only as directed by your health care provider.  Rest your knee. ? When resting, keep your knee raised above the level of your heart. ? Avoid activities that cause knee pain.  Apply ice to the injured area: ? Put ice in a plastic bag. ? Place a towel between your skin and the bag. ? Leave the ice on for 20 minutes, 2-3 times a day.  Use splints, braces, knee supports, or walking aids as directed by your health care provider.  Perform stretching and strengthening exercises as directed by your health care provider or physical therapist.  Keep all follow-up visits as directed by your health care   provider. This is important. Contact a health care provider if:  Your symptoms get worse.  You are not improving with home care. This information is not intended to replace advice given to you by your health care provider. Make sure you discuss any questions you have with your health care provider. Document Released: 07/13/2009 Document Revised: 12/31/2015 Document Reviewed: 10/14/2013 [employer]vier Interactive Patient Education   2018 [employer]  

## 2017-08-10 NOTE — Progress Notes (Signed)
OFFICE VISIT  08/10/2017   CC:  Chief Complaint  Patient presents with  . Knee Pain    left  . Follow-up    Thyroid   HPI:    Patient is a 72 y.o. Caucasian female who presents for left knee pain as well as "thyroid check". Onset of L knee pain about 4 weeks ago, no known strain/trauma.   Recently went to [place] and stairs/excessive walking was harder due to the pain. Front of knee primarily, sometimes in back.  Icy hot has been tried, + tylenol. No feeling of instability.  No signif swelling, no redness or warmth.  Hypothyroidism: taking levothyroxine correctly daily.  Says she does feel a little more tired lately.  Past Medical History:  Diagnosis Date  . Anemia of chronic kidney failure    r/t kidney function- receives Procrit  . Anxiety   . Arthritis   . Cancer [facility])    kidney cancer, left nephrectomy  . Chronic renal insufficiency, stage III (moderate) (HCC) 10/2015   Post-transplant Cr 1.3, GFR 41 as of June 2017 f/u w/ [facility]  . CMV infection [facility]) summer 2017   Valcyte per ID/Renal transplant team  . Diverticulosis    a. 05/2012 colonoscopy  . Erosive lichen planus of vulva    Topical steroids (managed by Dr. Mila Palmer [facility].  . ESRD (end stage renal disease) ([place])    began dialysis 2014--followed by Dr. [name].  Received deceased donor kidney transplant 10/2015.  [name] FSGS (focal segmental glomerulosclerosis)    right kidney; hx of left renal cell cancer and got nephrectomy 1993.  [name] GERD (gastroesophageal reflux disease)   . Gout   . Heart murmur    (Diastolic) ECHO 10/5327 showed that this murmur is coming from pt's R arm AV fistula  . 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  . Hiatal hernia   . History of renal cell cancer 1993   Left nephrectomy  . Hyperlipidemia   .  Hypertension   . Hypothyroidism   . Impingement syndrome of left shoulder 04/2016   Dr. Christy Sartorius to PT  . Osteopenia   . Osteoporosis 06/07/2016   DEXA T-score of -3.1.  Fosamax started 06/2016.  Repeat DEXA 2 yrs.  . Renal transplant recipient 2015/12/04  . Renal transplant recipient   . SVT (supraventricular tachycardia) (HCC)     Past Surgical History:  Procedure Laterality Date  . AV FISTULA PLACEMENT Right    Right arm: aneurismal dilatation 2017 being followed by CV surgeons  . CARDIOVASCULAR STRESS TEST     03/10/15 ETT ([facility]): Exercise ECG negative at 83% max predicted HR.   [name] COLONOSCOPY  2003; 05/2012   diverticulosis, no polyps.  Recall 10 yrs  . colonoscopy with polypectomy     Dr Henrene Pastor  . DEXA  06/07/2016   T-score -3.1  . KIDNEY TRANSPLANT  December 04, 2015   Deceased donor kidney transplant, with Thymoglobulin induction  . LIGATION OF ARTERIOVENOUS  FISTULA Right 02/14/2017   Procedure: LIGATION OF ARTERIOVENOUS  FISTULA;  Surgeon: Angelia Mould, MD;  Location: [place];  Service: Vascular;  Laterality: Right;  . NEPHRECTOMY  1993   for malignancy- left  . RENAL BIOPSY     right  . RESECTION OF ARTERIOVENOUS FISTULA ANEURYSM Right 02/14/2017   Procedure: EXCISION OF RIGHT BRACHIOCEPHALIC ARTERIOVENOUS FISTULA ANEURYSM;  Surgeon: Angelia Mould, MD;  Location[facility] OR;  Service: Vascular;  Laterality: Right;  . TEE WITHOUT CARDIOVERSION N/A 08/27/2013   Procedure: TRANSESOPHAGEAL ECHOCARDIOGRAM (TEE);  Surgeon: [name], MD;  Location: [facility] END[facility]COPY;  Service: Cardiovascular;  Laterality: N/A;  . TOTAL ABDOMINAL HYSTERECTOMY W/ BILATERAL SALPINGOOPHORECTOMY  1997   fibroids  . TRANSTHORACIC ECHOCARDIOGRAM     03/10/15 echo ([facility]): LV cavity normal in size, focal basal hypertrophy, normal systolic function, EF 62% (visual est). Normal wall motion, no regional wall motion abnormalities. Mild diastolic dysfunction with normal  LA chamber size. No significant valve stenosis or regurgitation.  . VULVA / PERINEUM BIOPSY  2015    Outpatient Medications Prior to Visit  Medication Sig Dispense Refill  . acetaminophen (TYLENOL) 500 MG tablet Take 500-750 mg by mouth every 6 (six) hours as needed for pain.    [name] aspirin EC 81 MG EC tablet Take 1 tablet (81 mg total) by mouth daily.    Skipper Cliche SALINE NASAL DROPS NA Place 2 sprays into both nostrils daily.    . Camphor-Eucalyptus-Menthol (VICKS VAPORUB EX) Apply 1 application topically daily.    [name] doxazosin (CARDURA) 2 MG tablet Take 2 mg by mouth 2 (two) times daily.     [name] levothyroxine (SYNTHROID, LEVOTHROID) 125 MCG tablet Take 1 tablet (125 mcg total) by mouth daily. 30 tablet 11  . loratadine (CLARITIN) 10 MG tablet Take 20 mg by mouth as needed for allergies.    [name] meclizine (ANTIVERT) 25 MG tablet Take 1 tablet (25 mg total) by mouth 3 (three) times daily as needed for dizziness or nausea. 20 tablet 0  . mycophenolate (MYFORTIC) 360 MG TBEC EC tablet Take 360 mg by mouth 2 (two) times daily.    . silver sulfADIAZINE (SILVADENE) 1 % cream Apply 1 application topically daily. Mixes with Triamcinolone cream    . SODIUM BICARBONATE PO Take 650 mg by mouth 2 (two) times daily. Takes 2 in AM and 2 at bedtime    . sulfamethoxazole-trimethoprim (BACTRIM,SEPTRA) 400-80 MG tablet Take 1 tablet by mouth daily.    [name] triamcinolone (NASACORT ALLERGY 24HR) 55 MCG/ACT AERO nasal inhaler Place 2 sprays into the nose daily.    [name] triamcinolone cream (KENALOG) 0.1 % Apply 1 application topically daily.    . verapamil (CALAN-SR) 240 MG CR tablet Take 240 mg by mouth daily.     . tacrolimus (PROGRAF) 5 MG capsule Take 5 mg by mouth 2 (two) times daily.    . valGANciclovir (VALCYTE) 450 MG tablet Take by mouth as needed.     No facility-administered medications prior to visit.     Allergies  Allergen Reactions  . Cefaclor Rash  . Naproxen Rash  . Enalapril Maleate Cough    Vasotec  .  Metoprolol Tartrate Rash    On legs    ROS As per HPI  PE: Blood pressure (!) 149/88, pulse 77, temperature 98.5 F (36.9 C), temperature source Oral, resp. rate 16, height 5\' 5"  (1.651 m), weight 224 lb 8 oz (101.8 kg), SpO2 96 %. Gen: Alert, well appearing.  Patient is oriented to person, place, time, and situation. AFFECT: pleasant, lucid thought and speech. Left knee with mild diffuse bony hypertrophy but no effusion.  No erythema or warmth.  Mild diffuse peripatellar TTP. No patellar tendon tenderness.  No joint line tenderness.  No knee instablility.  Mildly + patellar grind, w/out patellar subluxation. Full flexion and extension w/out pain, mild  Knee crepitus on L with flex/ext.  McMurray's neg. She has definite valgus shaping from hips to knees to feet.  LABS:  Lab Results  Component Value Date   TSH 2.77 07/26/2016   Lab Results  Component Value Date   WBC 5.5 09/16/2015   HGB 12.6 02/14/2017   HCT 37.0 02/14/2017   MCV 94.8 09/16/2015   PLT 179 09/16/2015   Lab Results  Component Value Date   CREATININE 3.61 (H) 09/16/2015   BUN 12 09/16/2015   NA 142 02/14/2017   K 3.9 02/14/2017   CL 96 (L) 09/16/2015   CO2 33 (H) 09/16/2015   Lab Results  Component Value Date   ALT 21 12/14/2012   AST 24 12/14/2012   ALKPHOS 71 12/14/2012   BILITOT 0.3 12/14/2012   Lab Results  Component Value Date   CHOL 158 07/26/2016   Lab Results  Component Value Date   HDL 44.90 07/26/2016   Lab Results  Component Value Date   LDLCALC 93 07/26/2016   Lab Results  Component Value Date   TRIG 101.0 07/26/2016   Lab Results  Component Value Date   CHOLHDL 4 07/26/2016   Lab Results  Component Value Date   HGBA1C 5.4 02/23/2012    IMPRESSION AND PLAN:  1) Patellofemoral pain, left knee: discussed dx, reassured pt, reviewed and printed EMR information handout on this topic. Check plain film of left knee since this is unilateral.  2) Hypothyroidism: TSH  monitoring due today.  3) CRI, pt is s/p renal transplant and gets f/u q8mo alternating between local nephrologist and transplant nephrologist/surgeon. Most recent f/u with Dr. [name] locally was 05/2017: UA normal, .  Get labs/office visit from recent visit w/nephr Dr. [name] (05/2017)  An After Visit Summary was printed and given to the patient.  FOLLOW UP: Return if symptoms worsen or fail to improve.  Signed:  Crissie Sickles, MD           08/10/2017

## 2017-08-13 ENCOUNTER — Encounter: Payer: Self-pay | Admitting: Family Medicine

## 2017-08-14 ENCOUNTER — Other Ambulatory Visit: Payer: Self-pay | Admitting: Family Medicine

## 2017-08-14 DIAGNOSIS — M1712 Unilateral primary osteoarthritis, left knee: Secondary | ICD-10-CM

## 2017-08-14 DIAGNOSIS — M25562 Pain in left knee: Secondary | ICD-10-CM

## 2017-08-14 DIAGNOSIS — G8929 Other chronic pain: Secondary | ICD-10-CM

## 2017-08-16 ENCOUNTER — Ambulatory Visit (INDEPENDENT_AMBULATORY_CARE_PROVIDER_SITE_OTHER): Payer: Medicare Other | Admitting: Family Medicine

## 2017-08-16 ENCOUNTER — Encounter: Payer: Self-pay | Admitting: Family Medicine

## 2017-08-16 VITALS — BP 105/71 | HR 71 | Temp 98.1°F | Resp 16 | Wt 230.0 lb

## 2017-08-16 DIAGNOSIS — M25562 Pain in left knee: Secondary | ICD-10-CM | POA: Diagnosis not present

## 2017-08-16 DIAGNOSIS — B9789 Other viral agents as the cause of diseases classified elsewhere: Secondary | ICD-10-CM

## 2017-08-16 DIAGNOSIS — J069 Acute upper respiratory infection, unspecified: Secondary | ICD-10-CM | POA: Diagnosis not present

## 2017-08-16 DIAGNOSIS — M1712 Unilateral primary osteoarthritis, left knee: Secondary | ICD-10-CM | POA: Diagnosis not present

## 2017-08-16 MED ORDER — METHYLPREDNISOLONE ACETATE 80 MG/ML IJ SUSP
80.0000 mg | Freq: Once | INTRAMUSCULAR | Status: AC
  Administered 2017-08-16: 80 mg via INTRAMUSCULAR

## 2017-08-16 NOTE — Progress Notes (Signed)
OFFICE VISIT  08/16/2017   CC:  Chief Complaint  Patient presents with  . Knee Pain    left knee pain, ? sinusitis   HPI:    Patient is a 72 y.o. Caucasian female who presents for the purpose of getting a left knee intra-articular injection for osteoarthritic pain.  On 08/10/17 a knee x-ray I obtained showed: IMPRESSION: 1. Diffuse severe tricompartment degenerative change left knee. Prominent rounded calcific densities noted of the posterior aspect of the distal femur. This may represent a loose body in the popliteal space. This could represent a small bone fragment from prior injury. No acute appearing bony abnormality identified.  I have referred her to an orthopedist and I gave her the option of coming in for steroid injection into L knee while she is waiting to see the orthopedist.  She is here today for this injection. She will be seeing Dr. Alvan Dame. Left knee feels about the same.  She also has some resp complaints.  Onset was about 1 mo ago. Sneezing, head congested, some mild cough.  No fever.  No ST or HA. No wheezing, chest tightness, or SOB.   Symptoms have gotten a little better over the month. No otc med tried except tylenol and saline and vicks.  Occ takes nasacort.  Past Medical History:  Diagnosis Date  . Anemia of chronic kidney failure    r/t kidney function- receives Procrit  . Anxiety   . Cancer [facility])    kidney cancer, left nephrectomy  . Chronic renal insufficiency, stage III (moderate) (HCC) 10/2015   Post-transplant Cr 1.3, GFR 41 as of June 2017 f/u w/ [facility]  . CMV infection [facility]) summer 2017   Valcyte per ID/Renal transplant team  . Diverticulosis    a. 05/2012 colonoscopy  . Erosive lichen planus of vulva    Topical steroids (managed by Dr. Mila [facility].  . ESRD (end stage renal disease) ([place])    began dialysis 2014--followed by Dr. [name].  Received deceased donor kidney  transplant 10/2015.  [name] FSGS (focal segmental glomerulosclerosis)    right kidney; hx of left renal cell cancer and got nephrectomy 1993.  [name] GERD (gastroesophageal reflux disease)   . Gout   . Heart murmur    (Diastolic) ECHO 01/3874 showed that this murmur is coming from pt's R arm AV fistula  . 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  . Hiatal hernia   . History of renal cell cancer 1993   Left nephrectomy  . Hyperlipidemia   . Hypertension   . Hypothyroidism   . Impingement syndrome of left shoulder 04/2016   Dr. Christy Sartorius to PT  . Osteoarthritis of left knee 08/2017   severe, diffuse, tricompartmental  . Osteoporosis 06/07/2016   DEXA T-score of -3.1.  Fosamax started 06/2016.  Repeat DEXA 2 yrs.  . Renal transplant recipient 11/06/15  . SVT (supraventricular tachycardia) (HCC)     Past Surgical History:  Procedure Laterality Date  . AV FISTULA PLACEMENT Right    Right arm: aneurismal dilatation 2017 being followed by CV surgeons  . CARDIOVASCULAR STRESS TEST     03/10/15 ETT ([facility]): Exercise ECG negative at 83% max predicted HR.   [name] COLONOSCOPY  2003; 05/2012   diverticulosis, no polyps.  Recall 10 yrs  . colonoscopy with polypectomy     Dr  Perry  . DEXA  06/07/2016   T-score -3.1  . KIDNEY TRANSPLANT  2015-11-29   Deceased donor kidney transplant, with Thymoglobulin induction  . LIGATION OF ARTERIOVENOUS  FISTULA Right 02/14/2017   Procedure: LIGATION OF ARTERIOVENOUS  FISTULA;  Surgeon: Angelia Mould, MD;  Location: [place];  Service: Vascular;  Laterality: Right;  . NEPHRECTOMY  1993   for malignancy- left  . RENAL BIOPSY     right  . RESECTION OF ARTERIOVENOUS FISTULA ANEURYSM Right 02/14/2017   Procedure: EXCISION OF RIGHT BRACHIOCEPHALIC ARTERIOVENOUS FISTULA ANEURYSM;  Surgeon: Angelia Mould, MD;  Location: [place];  Service: Vascular;  Laterality: Right;  . TEE WITHOUT CARDIOVERSION N/A 08/27/2013   Procedure: TRANSESOPHAGEAL ECHOCARDIOGRAM (TEE);  Surgeon: [name], MD;  Location: [facility] ENDOSCOPY;  Service: Cardiovascular;  Laterality: N/A;  . TOTAL ABDOMINAL HYSTERECTOMY W/ BILATERAL SALPINGOOPHORECTOMY  1997   fibroids  . TRANSTHORACIC ECHOCARDIOGRAM     03/10/15 echo ([facility]): LV cavity normal in size, focal basal hypertrophy, normal systolic function, EF 33% (visual est). Normal wall motion, no regional wall motion abnormalities. Mild diastolic dysfunction with normal LA chamber size. No significant valve stenosis or regurgitation.  . VULVA / PERINEUM BIOPSY  2015    Outpatient Medications Prior to Visit  Medication Sig Dispense Refill  . acetaminophen (TYLENOL) 500 MG tablet Take 500-750 mg by mouth every 6 (six) hours as needed for pain.    [name] aspirin EC 81 MG EC tablet Take 1 tablet (81 mg total) by mouth daily.    Skipper Cliche SALINE NASAL DROPS NA Place 2 sprays into both nostrils daily.    . Camphor-Eucalyptus-Menthol (VICKS VAPORUB EX) Apply 1 application topically daily.    [name] doxazosin (CARDURA) 2 MG tablet Take 2 mg by mouth 2 (two) times daily.     [name] levothyroxine (SYNTHROID, LEVOTHROID) 125 MCG tablet TAKE 1 TABLET BY MOUTH EVERY DAY 30 tablet 5  . loratadine (CLARITIN) 10 MG tablet Take 20 mg by mouth as needed for allergies.    [name] meclizine (ANTIVERT) 25 MG tablet Take 1 tablet (25 mg total) by mouth 3 (three) times daily as needed for dizziness or nausea. 20 tablet 0  . mycophenolate (MYFORTIC) 360 MG TBEC EC tablet Take 360 mg by mouth 2 (two) times daily.    . silver sulfADIAZINE (SILVADENE) 1 % cream Apply 1 application topically daily. Mixes with Triamcinolone cream    . SODIUM BICARBONATE PO Take 650 mg by mouth 2 (two) times daily. Takes 2 in AM and 2 at bedtime    . sulfamethoxazole-trimethoprim (BACTRIM,SEPTRA) 400-80 MG tablet Take 1 tablet by mouth daily.     . tacrolimus (PROGRAF) 1 MG capsule Take 1 mg by mouth. Take 4 capwules by mouth 2 times a day    . triamcinolone (NASACORT ALLERGY 24HR) 55 MCG/ACT AERO nasal inhaler Place 2 sprays into the nose daily.    [name] triamcinolone cream (KENALOG) 0.1 % Apply 1 application topically daily.    . verapamil (CALAN-SR) 240 MG CR tablet Take 240 mg by mouth daily.     . tacrolimus (PROGRAF) 5 MG capsule Take 5 mg by mouth 2 (two) times daily.     No facility-administered medications prior to visit.     Allergies  Allergen Reactions  . Cefaclor Rash  . Naproxen Rash  . Enalapril Maleate Cough    Vasotec  . Metoprolol Tartrate Rash    On legs    ROS As  per HPI  PE: Blood pressure 105/71, pulse 71, temperature 98.1 F (36.7 C), temperature source Oral, resp. rate 16, weight 230 lb (104.3 kg), SpO2 95 %. VS: noted--normal. Gen: alert, NAD, NONTOXIC APPEARING. HEENT: eyes without injection, drainage, or swelling.  Ears: EACs clear, TMs with normal light reflex and landmarks.  Nose: Clear rhinorrhea, with some dried, crusty exudate adherent to mildly injected mucosa.  No purulent d/c.  No paranasal sinus TTP.  No facial swelling.  Throat and mouth without focal lesion.  No pharyngial swelling, erythema, or exudate.   Neck: supple, no LAD.   LUNGS: CTA bilat, nonlabored resps.   CV: RRR, no m/r/g. EXT: no c/c/e SKIN: no rash  LABS:  Lab Results  Component Value Date   TSH 3.27 08/10/2017    IMPRESSION AND PLAN:  1) Primary osteoarthritis L knee--severe tricompartmental, significant pain lately. Referred to orthopedist.  Procedure: Therapeutic left knee injection.  The patient's clinical condition is marked by substantial pain and/or significant functional disability.  Other conservative therapy has not provided relief, is contraindicated, or not appropriate.  There is a reasonable likelihood that injection will significantly improve the patient's pain and/or functional disability. Cleaned  skin with alcohol swab, used anterior approach (medial) to enter knee joint, Injected 14ml of 80mg /ml depomedrol and 2 ml of 1% plain lidocaine into joint space without resistance.  No immediate complications.  Patient tolerated procedure well.  Post-injection care discussed, including 20 min of icing 1-2 times in the next 4-8 hours, frequent non weight-bearing ROM exercises over the next few days, and general pain medication management.  2) Viral URI with cough, prolonged but improving. No sign of bacterial infection or RAD. Continue current symptomatic care. Increase clear fluid intake.  An After Visit Summary was printed and given to the patient.  FOLLOW UP: Return in about 6 months (around 02/13/2018) for annual CPE (fasting).  Signed:  Crissie Sickles, MD           08/16/2017

## 2017-08-17 DIAGNOSIS — Z940 Kidney transplant status: Secondary | ICD-10-CM | POA: Diagnosis not present

## 2017-08-17 DIAGNOSIS — Z79899 Other long term (current) drug therapy: Secondary | ICD-10-CM | POA: Diagnosis not present

## 2017-08-17 DIAGNOSIS — R799 Abnormal finding of blood chemistry, unspecified: Secondary | ICD-10-CM | POA: Diagnosis not present

## 2017-08-23 DIAGNOSIS — B259 Cytomegaloviral disease, unspecified: Secondary | ICD-10-CM | POA: Diagnosis not present

## 2017-08-23 DIAGNOSIS — L0291 Cutaneous abscess, unspecified: Secondary | ICD-10-CM | POA: Diagnosis not present

## 2017-08-23 DIAGNOSIS — Z940 Kidney transplant status: Secondary | ICD-10-CM | POA: Diagnosis not present

## 2017-08-28 ENCOUNTER — Encounter: Payer: Self-pay | Admitting: Family Medicine

## 2017-08-28 DIAGNOSIS — M1712 Unilateral primary osteoarthritis, left knee: Secondary | ICD-10-CM | POA: Diagnosis not present

## 2017-08-28 DIAGNOSIS — M25562 Pain in left knee: Secondary | ICD-10-CM | POA: Insufficient documentation

## 2017-09-13 DIAGNOSIS — R799 Abnormal finding of blood chemistry, unspecified: Secondary | ICD-10-CM | POA: Diagnosis not present

## 2017-09-13 DIAGNOSIS — Z940 Kidney transplant status: Secondary | ICD-10-CM | POA: Diagnosis not present

## 2017-09-13 DIAGNOSIS — Z79899 Other long term (current) drug therapy: Secondary | ICD-10-CM | POA: Diagnosis not present

## 2017-09-22 DIAGNOSIS — L304 Erythema intertrigo: Secondary | ICD-10-CM | POA: Diagnosis not present

## 2017-09-22 DIAGNOSIS — L438 Other lichen planus: Secondary | ICD-10-CM | POA: Diagnosis not present

## 2017-10-13 DIAGNOSIS — Z940 Kidney transplant status: Secondary | ICD-10-CM | POA: Diagnosis not present

## 2017-10-27 DIAGNOSIS — Z940 Kidney transplant status: Secondary | ICD-10-CM | POA: Diagnosis not present

## 2017-10-27 DIAGNOSIS — E785 Hyperlipidemia, unspecified: Secondary | ICD-10-CM | POA: Diagnosis not present

## 2017-11-02 DIAGNOSIS — R3129 Other microscopic hematuria: Secondary | ICD-10-CM | POA: Diagnosis not present

## 2017-11-08 DIAGNOSIS — E872 Acidosis: Secondary | ICD-10-CM | POA: Diagnosis not present

## 2017-11-08 DIAGNOSIS — I10 Essential (primary) hypertension: Secondary | ICD-10-CM | POA: Diagnosis not present

## 2017-11-08 DIAGNOSIS — Z79899 Other long term (current) drug therapy: Secondary | ICD-10-CM | POA: Diagnosis not present

## 2017-11-08 DIAGNOSIS — Z940 Kidney transplant status: Secondary | ICD-10-CM | POA: Diagnosis not present

## 2017-12-04 DIAGNOSIS — Z940 Kidney transplant status: Secondary | ICD-10-CM | POA: Diagnosis not present

## 2017-12-04 DIAGNOSIS — I129 Hypertensive chronic kidney disease with stage 1 through stage 4 chronic kidney disease, or unspecified chronic kidney disease: Secondary | ICD-10-CM | POA: Diagnosis not present

## 2017-12-19 DIAGNOSIS — R748 Abnormal levels of other serum enzymes: Secondary | ICD-10-CM | POA: Diagnosis not present

## 2017-12-19 DIAGNOSIS — Z940 Kidney transplant status: Secondary | ICD-10-CM | POA: Diagnosis not present

## 2018-01-03 ENCOUNTER — Other Ambulatory Visit: Payer: Self-pay | Admitting: Family Medicine

## 2018-01-03 ENCOUNTER — Ambulatory Visit (INDEPENDENT_AMBULATORY_CARE_PROVIDER_SITE_OTHER): Payer: Medicare Other

## 2018-01-03 ENCOUNTER — Encounter: Payer: Self-pay | Admitting: Family Medicine

## 2018-01-03 ENCOUNTER — Ambulatory Visit (INDEPENDENT_AMBULATORY_CARE_PROVIDER_SITE_OTHER): Payer: Medicare Other | Admitting: Family Medicine

## 2018-01-03 VITALS — BP 132/85 | HR 83 | Temp 98.5°F | Resp 16 | Ht 65.0 in | Wt 235.1 lb

## 2018-01-03 DIAGNOSIS — X58XXXA Exposure to other specified factors, initial encounter: Secondary | ICD-10-CM | POA: Diagnosis not present

## 2018-01-03 DIAGNOSIS — J3489 Other specified disorders of nose and nasal sinuses: Secondary | ICD-10-CM | POA: Diagnosis not present

## 2018-01-03 DIAGNOSIS — J329 Chronic sinusitis, unspecified: Secondary | ICD-10-CM | POA: Diagnosis not present

## 2018-01-03 DIAGNOSIS — M8000XA Age-related osteoporosis with current pathological fracture, unspecified site, initial encounter for fracture: Secondary | ICD-10-CM

## 2018-01-03 DIAGNOSIS — M84474A Pathological fracture, right foot, initial encounter for fracture: Secondary | ICD-10-CM

## 2018-01-03 DIAGNOSIS — M79671 Pain in right foot: Secondary | ICD-10-CM

## 2018-01-03 DIAGNOSIS — S92351A Displaced fracture of fifth metatarsal bone, right foot, initial encounter for closed fracture: Secondary | ICD-10-CM

## 2018-01-03 IMAGING — DX DG FOOT COMPLETE 3+V*R*
3 series · 3 of 3 positions shown · non-contrast
Comparison: None.

CLINICAL DATA: Inversion injury several days ago with persistent
lateral foot pain, initial encounter

EXAM:
RIGHT FOOT COMPLETE - 3+ VIEW

[foot ap (1 of 2)]
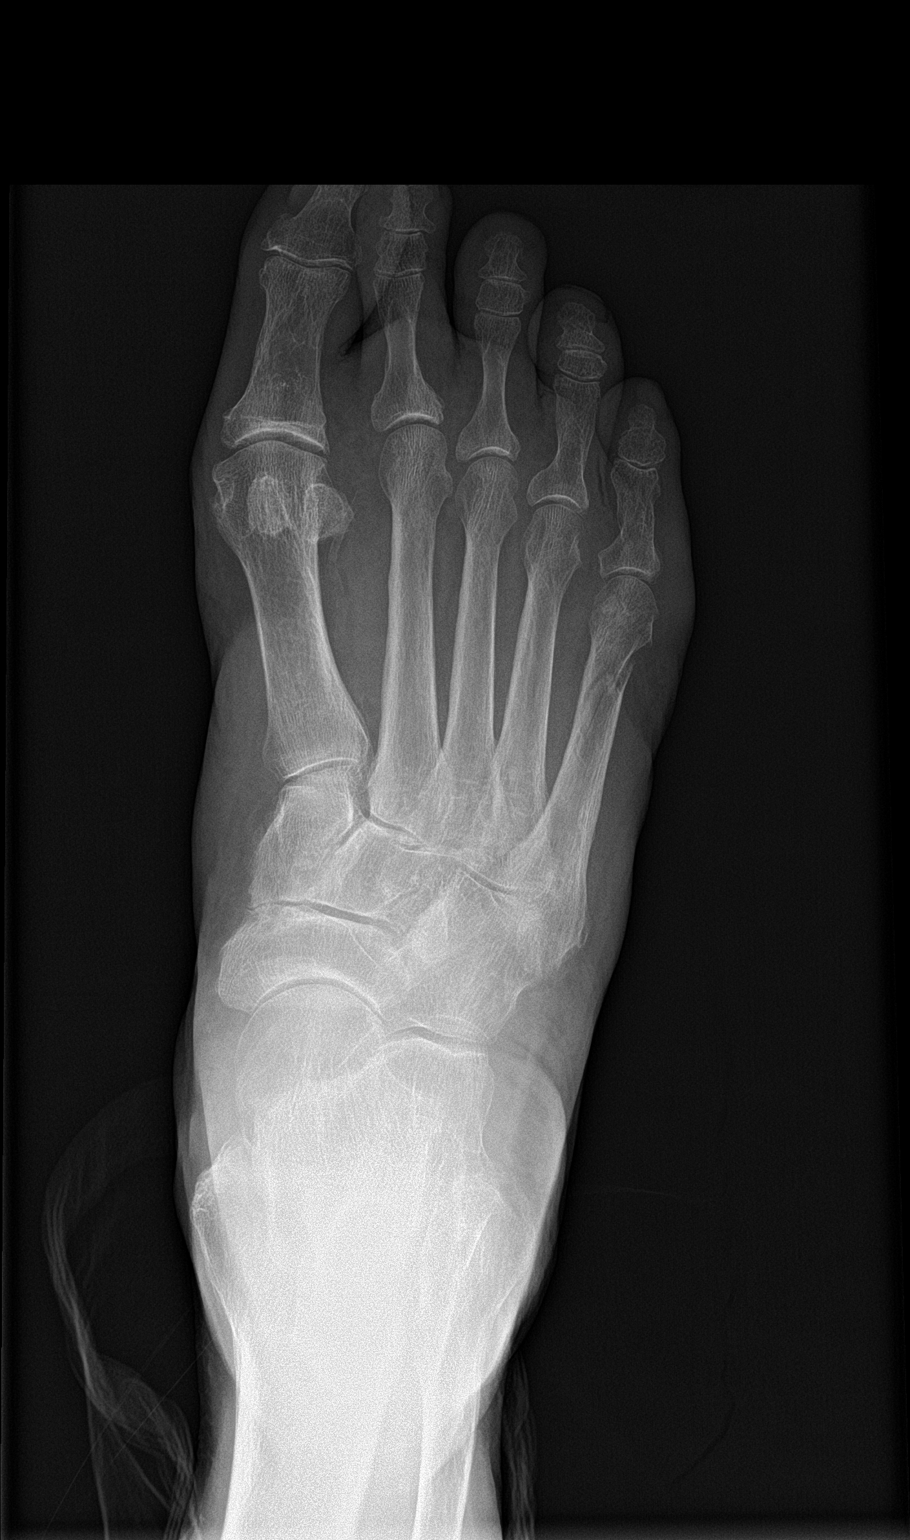

[foot lat]
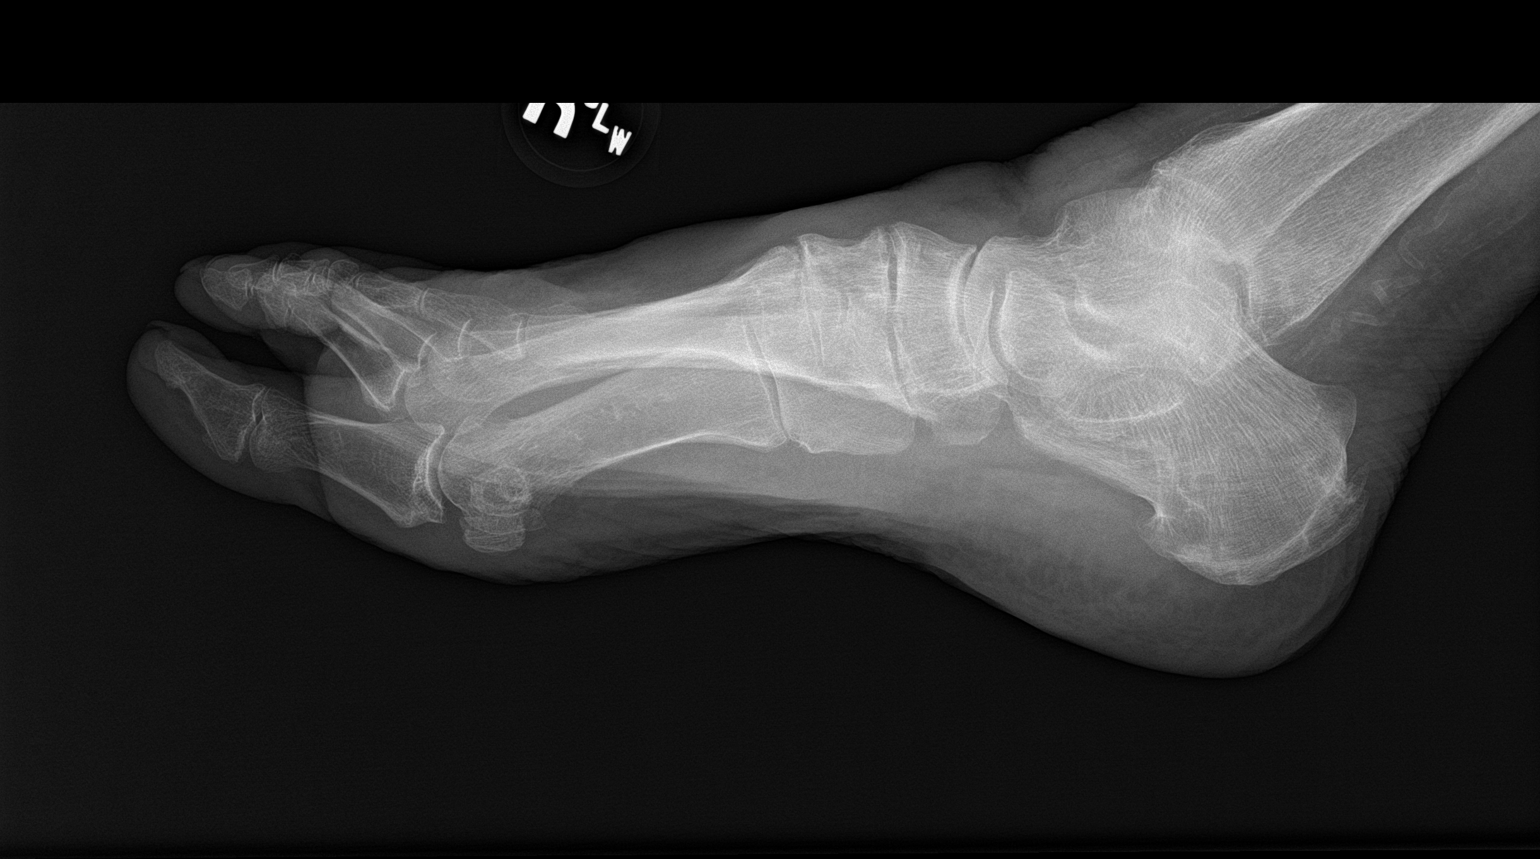

[foot ap (2 of 2)]
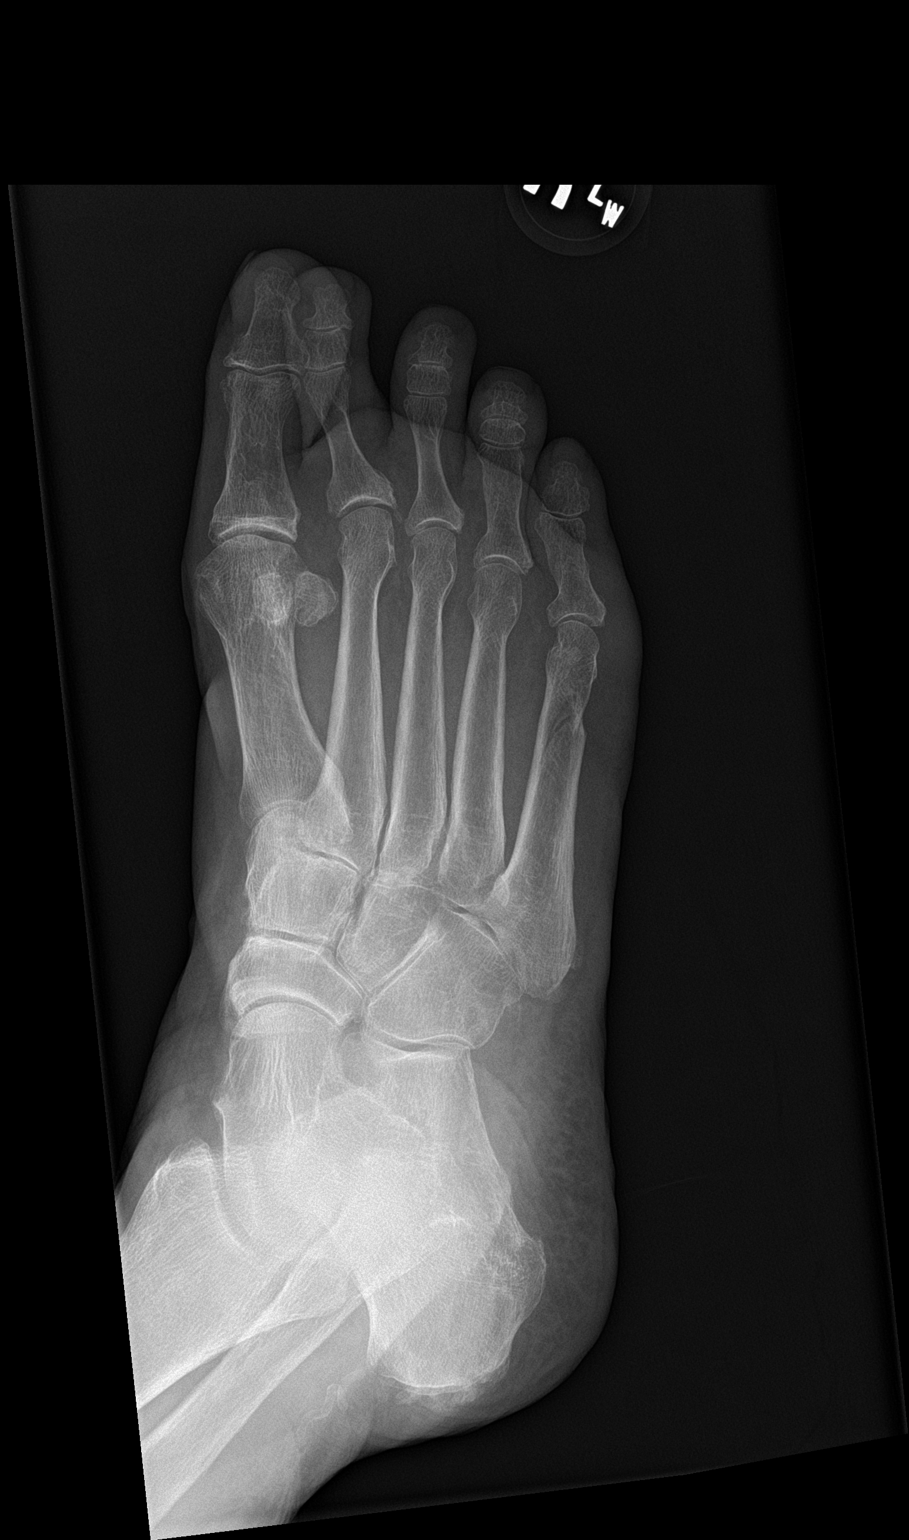

[3 of 3 positions shown; findings below may reference images not displayed]

FINDINGS: There is a fracture of the fifth metatarsal in the mid to distal
shaft just proximal to the metatarsal head. Changes in the fifth
proximal phalanx are noted as well likely representing prior healed
trauma. No other fractures are seen. No gross soft tissue
abnormality is noted.
IMPRESSION: Fifth metatarsal fracture distally.

Changes suggestive of prior healed trauma in the fifth proximal
phalanx.

## 2018-01-03 IMAGING — DX DG SINUSES COMPLETE 3+V
3 series · 3 of 3 positions shown · non-contrast
Comparison: [DATE]

CLINICAL DATA: Chronic sinus pain and pressure

EXAM:
PARANASAL SINUSES - COMPLETE 3 + VIEW

[pns waters]
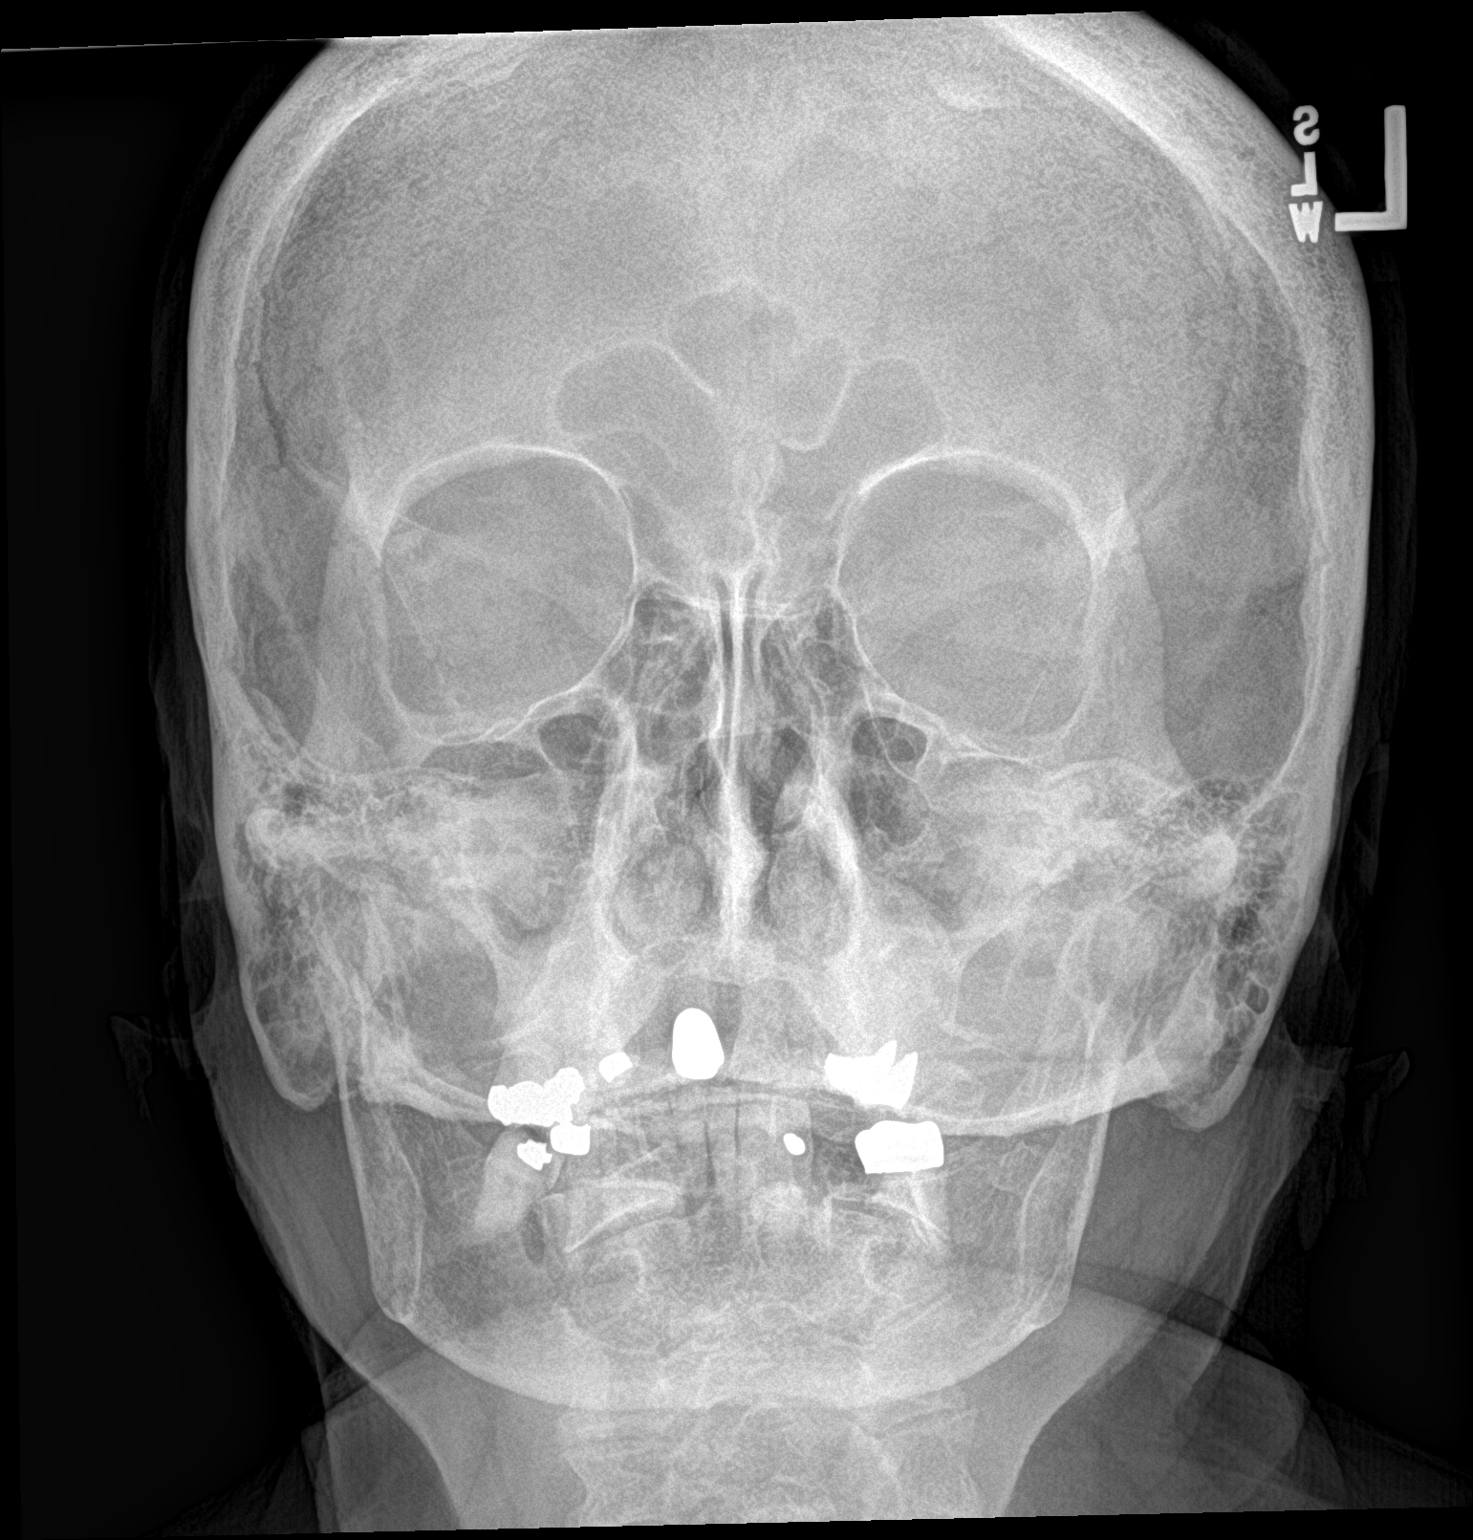

[[person_name]]
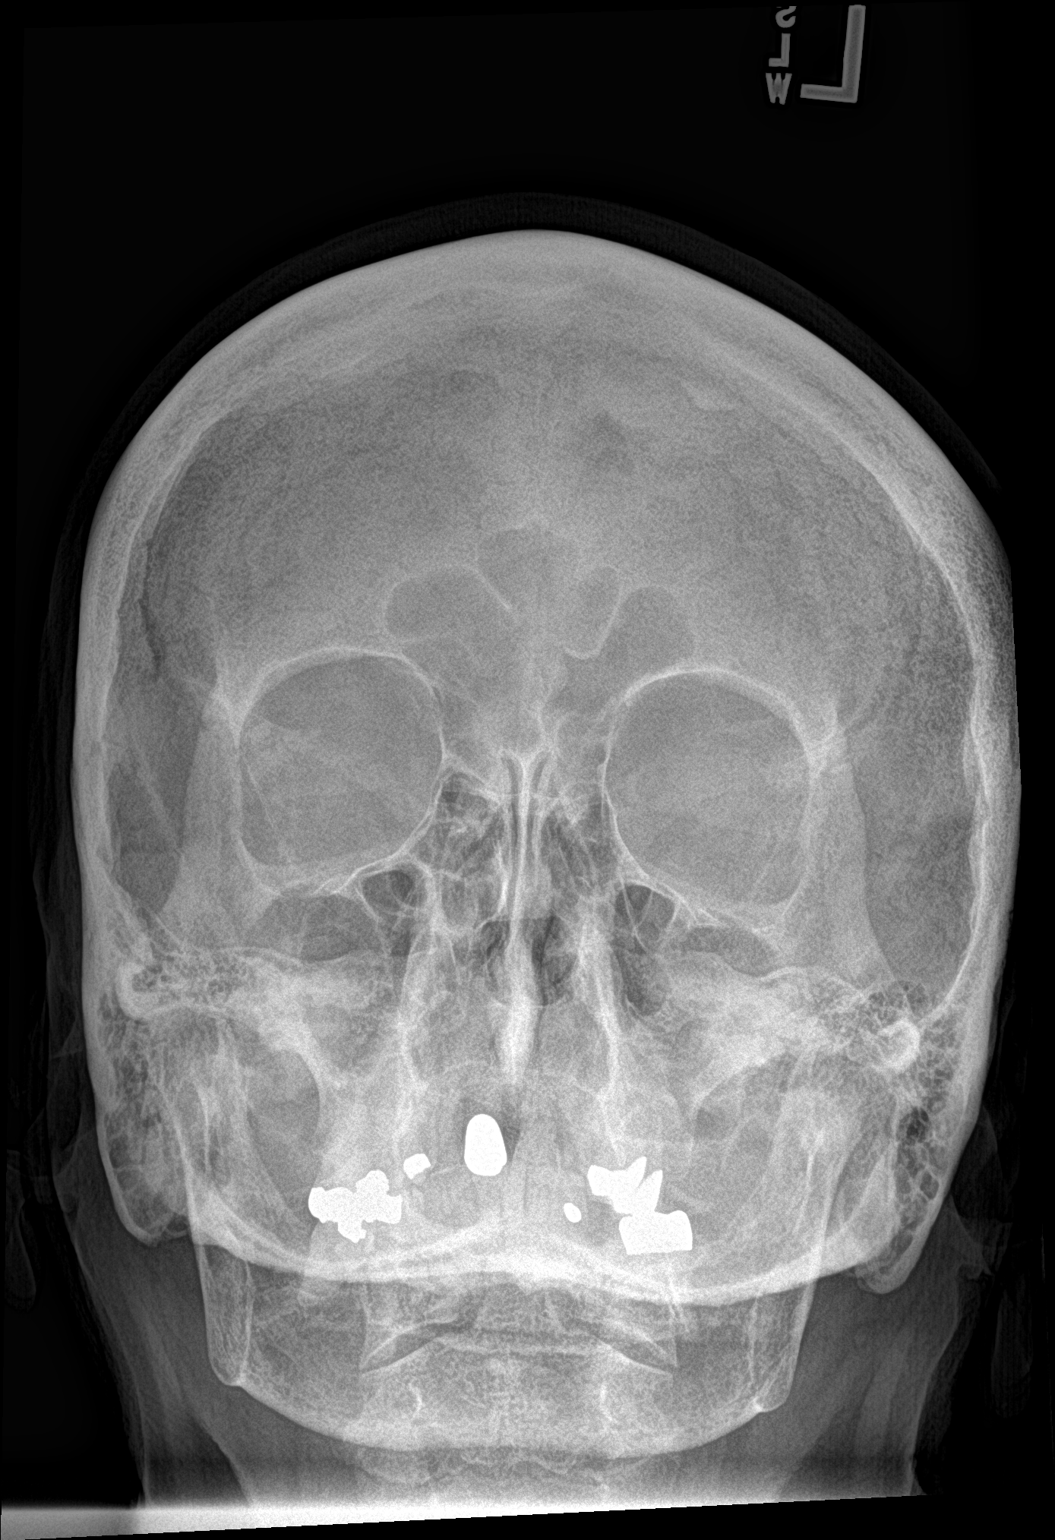

[pns lat]
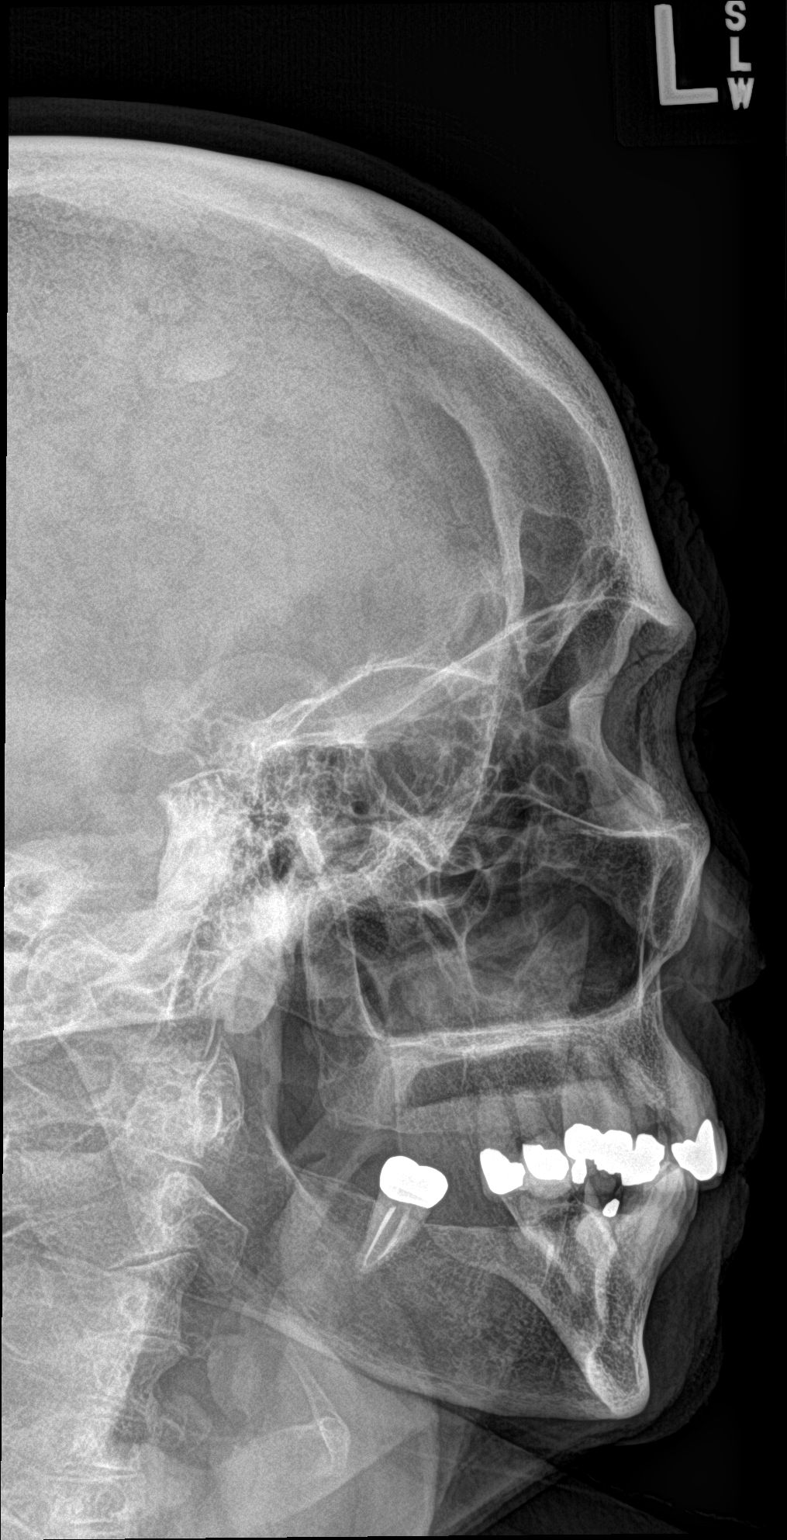

[3 of 3 positions shown; findings below may reference images not displayed]

FINDINGS: The paranasal sinus are aerated. There is no evidence of sinus
opacification air-fluid levels or mucosal thickening. No significant
bone abnormalities are seen.
IMPRESSION: No acute abnormality noted.  No change from the prior exam.

## 2018-01-03 NOTE — Progress Notes (Signed)
OFFICE VISIT  01/04/2018   CC:  Chief Complaint  Patient presents with  . Swelling    right foot  . Sinus Infection    ?   HPI:    Patient is a 72 y.o. Caucasian female who is s/p kidney transplant (on immunosuppressant/antirejection  meds) presents for bothersome chronic sinus pressure and recent R foot swelling and pain.  Head "always" feels clogged up/sinus pressure around nose and in forehead.  No drainage from nose, not much PND.  No sneezing, ST, coughing.  No fevers.  No eyes itching or drainage, no ear pains. Forehead and retro-orbital pressure.  Has felt a couple episodes of disequilibrium lately--no vertigo. Lasted a few seconds.  No nausea, no presyncope. No recent acute change in these sx's.  Also,onset 4-5 d/a, R foot lateral aspect pain and some swelling after doing mild inversion injury when stepping on something.  She doesn't recall the injury being very significant at all.  She then noted a couple of "knots" on dorsal surface of her foot.  These knots don't hurt.  Wanted to come and get checked out for this.  ROS: no fevers, no rashes, no abd pain, no vision c/o, no hearing c/o, no focal weakness, no tremor, no abnormal gait or recent falls.  Past Medical History:  Diagnosis Date  . Anemia of chronic kidney failure    r/t kidney function- receives Procrit  . Anxiety   . Cancer [facility])    kidney cancer, left nephrectomy  . Chronic renal insufficiency, stage III (moderate) (HCC) 10/2015   Post-transplant Cr 1.3, GFR 41 as of June 2017 f/u w/ [facility]  . CMV infection [facility]) summer 2017   Valcyte per ID/Renal transplant team  . Diverticulosis    a. 05/2012 colonoscopy  . Erosive lichen planus of vulva    Topical steroids (managed by Dr. Mila Palmer [facility]  . ESRD (end stage renal disease) ([place])    began dialysis 2014--followed by Dr. [name].  Received deceased donor kidney transplant 10/2015.  [name] FSGS  (focal segmental glomerulosclerosis)    right kidney; hx of left renal cell cancer and got nephrectomy 1993.  [name] GERD (gastroesophageal reflux disease)   . Gout   . Heart murmur    (Diastolic) ECHO 02/6194 showed that this murmur is coming from pt's R arm AV fistula  . 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  . Hiatal hernia   . History of renal cell cancer 1993   Left nephrectomy  . Hyperlipidemia   . Hypertension   . Hypothyroidism   . Impingement syndrome of left shoulder 04/2016   Dr. Christy Sartorius to PT  . Osteoarthritis of left knee 08/2017   severe, diffuse, tricompartmental.  Responded to steroid injection.    . Osteoporosis 06/07/2016   DEXA T-score of -3.1.  Fosamax started 06/2016.  Repeat DEXA 2 yrs.  . Renal transplant recipient 11/06/15  . SVT (supraventricular tachycardia) (HCC)     Past Surgical History:  Procedure Laterality Date  . AV FISTULA PLACEMENT Right    Right arm: aneurismal dilatation 2017 being followed by CV surgeons  . CARDIOVASCULAR STRESS TEST     03/10/15 ETT ([facility]): Exercise ECG negative at 83% max predicted HR.   [name] COLONOSCOPY  2003; 05/2012   diverticulosis, no polyps.  Recall 10 yrs  . colonoscopy with polypectomy  Dr Henrene Pastor  . DEXA  06/07/2016   T-score -3.1  . KIDNEY TRANSPLANT  2015/11/27   Deceased donor kidney transplant, with Thymoglobulin induction  . LIGATION OF ARTERIOVENOUS  FISTULA Right 02/14/2017   Procedure: LIGATION OF ARTERIOVENOUS  FISTULA;  Surgeon: Angelia Mould, MD;  Location: [place];  Service: Vascular;  Laterality: Right;  . NEPHRECTOMY  1993   for malignancy- left  . RENAL BIOPSY     right  . RESECTION OF ARTERIOVENOUS FISTULA ANEURYSM Right 02/14/2017   Procedure: EXCISION OF RIGHT BRACHIOCEPHALIC ARTERIOVENOUS FISTULA ANEURYSM;  Surgeon: Angelia Mould, MD;   Location: [place];  Service: Vascular;  Laterality: Right;  . TEE WITHOUT CARDIOVERSION N/A 08/27/2013   Procedure: TRANSESOPHAGEAL ECHOCARDIOGRAM (TEE);  Surgeon: [name], MD;  Location: [facility] ENDOSCOPY;  Service: Cardiovascular;  Laterality: N/A;  . TOTAL ABDOMINAL HYSTERECTOMY W/ BILATERAL SALPINGOOPHORECTOMY  1997   fibroids  . TRANSTHORACIC ECHOCARDIOGRAM     03/10/15 echo ([facility]): LV cavity normal in size, focal basal hypertrophy, normal systolic function, EF 16% (visual est). Normal wall motion, no regional wall motion abnormalities. Mild diastolic dysfunction with normal LA chamber size. No significant valve stenosis or regurgitation.  . VULVA / PERINEUM BIOPSY  2015    Outpatient Medications Prior to Visit  Medication Sig Dispense Refill  . acetaminophen (TYLENOL) 500 MG tablet Take 500-750 mg by mouth every 6 (six) hours as needed for pain.    [name] aspirin EC 81 MG EC tablet Take 1 tablet (81 mg total) by mouth daily.    Skipper Cliche SALINE NASAL DROPS NA Place 2 sprays into both nostrils daily.    . calcitRIOL (ROCALTROL) 0.25 MCG capsule Take 1 capsule by mouth daily.    . Camphor-Eucalyptus-Menthol (VICKS VAPORUB EX) Apply 1 application topically daily.    [name] doxazosin (CARDURA) 2 MG tablet Take 2 mg by mouth 2 (two) times daily.     [name] levothyroxine (SYNTHROID, LEVOTHROID) 125 MCG tablet TAKE 1 TABLET BY MOUTH EVERY DAY 30 tablet 5  . loratadine (CLARITIN) 10 MG tablet Take 20 mg by mouth as needed for allergies.    [name] meclizine (ANTIVERT) 25 MG tablet Take 1 tablet (25 mg total) by mouth 3 (three) times daily as needed for dizziness or nausea. 20 tablet 0  . mycophenolate (MYFORTIC) 360 MG TBEC EC tablet Take 360 mg by mouth 2 (two) times daily.    . silver sulfADIAZINE (SILVADENE) 1 % cream Apply 1 application topically daily. Mixes with Triamcinolone cream    . SODIUM BICARBONATE PO Take 650 mg by mouth 2 (two) times daily. Takes 2 in AM and 2 at bedtime    .  sulfamethoxazole-trimethoprim (BACTRIM,SEPTRA) 400-80 MG tablet Take 1 tablet by mouth daily.    . tacrolimus (PROGRAF) 1 MG capsule Take 4 mg by mouth 2 (two) times daily.     [name] triamcinolone (NASACORT ALLERGY 24HR) 55 MCG/ACT AERO nasal inhaler Place 2 sprays into the nose daily.    [name] triamcinolone cream (KENALOG) 0.1 % Apply 1 application topically daily.    . verapamil (CALAN-SR) 240 MG CR tablet Take 240 mg by mouth daily.     . tacrolimus (PROGRAF) 5 MG capsule Take 5 mg by mouth 2 (two) times daily.     No facility-administered medications prior to visit.     Allergies  Allergen Reactions  . Cefaclor Rash  . Naproxen Rash  . Enalapril Maleate Cough    Vasotec  . Metoprolol  Tartrate Rash    On legs    ROS As per HPI  PE: Blood pressure 132/85, pulse 83, temperature 98.5 F (36.9 C), temperature source Oral, resp. rate 16, height 5\' 5"  (1.651 m), weight 235 lb 2 oz (106.7 kg), SpO2 94 %. Gen: Alert, well appearing.  Patient is oriented to person, place, time, and situation. AFFECT: pleasant, lucid thought and speech. ENT: Ears: EACs clear, normal epithelium.  TMs with good light reflex and landmarks bilaterally.  Eyes: no injection, icteris, swelling, or exudate.  EOMI, PERRLA. Nose: no drainage or turbinate edema/swelling.  No injection or focal lesion.  Mouth: lips without lesion/swelling.  Oral mucosa pink and moist.  Dentition intact and without obvious caries or gingival swelling.  Oropharynx without erythema, exudate, or swelling.  CV: RRR  LUNGS: CTA bilat, nonlabored resps, good aeration in all lung fields. R foot: mild focal TTP over lateral aspect of 5th metatarsal about mid shaft.  No swelling or erythema.  Minimal discomfort with ankle ROM.  N/V intact.  She has a soft, moveable, subcutaneous nodule over 1st metatarsal, non-tender.  LABS:  None today   IMPRESSION AND PLAN:  1) Sinus pressure, w/out any signs of allergic or infectious sinusitis. Question  atypical migraine HA's. However, if xray sinuses today is completely normal I want to get her evaluated by an ENT.  2) Right foot focal TTP 5th metatarsal: check plain film today to eval for fracture.  An After Visit Summary was printed and given to the patient.  FOLLOW UP: Return if symptoms worsen or fail to improve.  Signed:  Crissie Sickles, MD           01/04/2018

## 2018-01-04 ENCOUNTER — Other Ambulatory Visit (INDEPENDENT_AMBULATORY_CARE_PROVIDER_SITE_OTHER): Payer: Medicare Other

## 2018-01-04 ENCOUNTER — Other Ambulatory Visit: Payer: Self-pay | Admitting: Family Medicine

## 2018-01-04 DIAGNOSIS — M84474A Pathological fracture, right foot, initial encounter for fracture: Secondary | ICD-10-CM | POA: Diagnosis not present

## 2018-01-04 LAB — BASIC METABOLIC PANEL
BUN: 17 mg/dL (ref 6–23)
CALCIUM: 9.4 mg/dL (ref 8.4–10.5)
CO2: 24 mEq/L (ref 19–32)
Chloride: 106 mEq/L (ref 96–112)
Creatinine, Ser: 1.1 mg/dL (ref 0.40–1.20)
GFR: 51.84 mL/min — AB (ref >60.00)
GLUCOSE: 112 mg/dL — AB (ref 70–99)
POTASSIUM: 4.2 meq/L (ref 3.5–5.1)
SODIUM: 141 meq/L (ref 135–145)

## 2018-01-04 LAB — PHOSPHORUS: PHOSPHORUS: 2.9 mg/dL (ref 2.3–4.6)

## 2018-01-04 LAB — VITAMIN D 25 HYDROXY (VIT D DEFICIENCY, FRACTURES): VITD: 19.34 ng/mL — ABNORMAL LOW (ref 30.00–100.00)

## 2018-01-05 ENCOUNTER — Other Ambulatory Visit: Payer: Medicare Other

## 2018-01-05 ENCOUNTER — Other Ambulatory Visit: Payer: Self-pay | Admitting: Family Medicine

## 2018-01-05 DIAGNOSIS — L304 Erythema intertrigo: Secondary | ICD-10-CM | POA: Diagnosis not present

## 2018-01-05 DIAGNOSIS — N183 Chronic kidney disease, stage 3 unspecified: Secondary | ICD-10-CM

## 2018-01-05 DIAGNOSIS — L438 Other lichen planus: Secondary | ICD-10-CM | POA: Diagnosis not present

## 2018-01-05 DIAGNOSIS — L821 Other seborrheic keratosis: Secondary | ICD-10-CM | POA: Diagnosis not present

## 2018-01-05 DIAGNOSIS — D229 Melanocytic nevi, unspecified: Secondary | ICD-10-CM | POA: Diagnosis not present

## 2018-01-05 DIAGNOSIS — D1801 Hemangioma of skin and subcutaneous tissue: Secondary | ICD-10-CM | POA: Diagnosis not present

## 2018-01-05 DIAGNOSIS — L814 Other melanin hyperpigmentation: Secondary | ICD-10-CM | POA: Diagnosis not present

## 2018-01-06 DIAGNOSIS — M84479A Pathological fracture, unspecified toe(s), initial encounter for fracture: Secondary | ICD-10-CM

## 2018-01-06 HISTORY — DX: Pathological fracture, unspecified toe(s), initial encounter for fracture: M84.479A

## 2018-01-07 ENCOUNTER — Encounter: Payer: Self-pay | Admitting: Family Medicine

## 2018-01-07 LAB — VITAMIN D 1,25 DIHYDROXY
VITAMIN D 1, 25 (OH) TOTAL: 51 pg/mL (ref 18–72)
VITAMIN D3 1, 25 (OH): 51 pg/mL
Vitamin D2 1, 25 (OH)2: <8 pg/mL

## 2018-01-08 DIAGNOSIS — M79671 Pain in right foot: Secondary | ICD-10-CM | POA: Diagnosis not present

## 2018-01-08 DIAGNOSIS — S92351A Displaced fracture of fifth metatarsal bone, right foot, initial encounter for closed fracture: Secondary | ICD-10-CM | POA: Diagnosis not present

## 2018-01-09 ENCOUNTER — Encounter: Payer: Self-pay | Admitting: Family Medicine

## 2018-01-11 ENCOUNTER — Encounter: Payer: Self-pay | Admitting: Family Medicine

## 2018-01-14 DIAGNOSIS — L814 Other melanin hyperpigmentation: Secondary | ICD-10-CM | POA: Insufficient documentation

## 2018-01-14 DIAGNOSIS — D1801 Hemangioma of skin and subcutaneous tissue: Secondary | ICD-10-CM | POA: Insufficient documentation

## 2018-01-14 DIAGNOSIS — D229 Melanocytic nevi, unspecified: Secondary | ICD-10-CM | POA: Insufficient documentation

## 2018-01-17 DIAGNOSIS — Z940 Kidney transplant status: Secondary | ICD-10-CM | POA: Diagnosis not present

## 2018-01-24 DIAGNOSIS — Z940 Kidney transplant status: Secondary | ICD-10-CM | POA: Diagnosis not present

## 2018-01-25 DIAGNOSIS — R42 Dizziness and giddiness: Secondary | ICD-10-CM | POA: Diagnosis not present

## 2018-01-25 DIAGNOSIS — J309 Allergic rhinitis, unspecified: Secondary | ICD-10-CM | POA: Diagnosis not present

## 2018-01-30 DIAGNOSIS — M79671 Pain in right foot: Secondary | ICD-10-CM | POA: Diagnosis not present

## 2018-02-20 DIAGNOSIS — Z940 Kidney transplant status: Secondary | ICD-10-CM | POA: Diagnosis not present

## 2018-02-20 DIAGNOSIS — I129 Hypertensive chronic kidney disease with stage 1 through stage 4 chronic kidney disease, or unspecified chronic kidney disease: Secondary | ICD-10-CM | POA: Diagnosis not present

## 2018-02-26 DIAGNOSIS — N2581 Secondary hyperparathyroidism of renal origin: Secondary | ICD-10-CM | POA: Diagnosis not present

## 2018-02-26 DIAGNOSIS — L0291 Cutaneous abscess, unspecified: Secondary | ICD-10-CM | POA: Diagnosis not present

## 2018-02-26 DIAGNOSIS — Z940 Kidney transplant status: Secondary | ICD-10-CM | POA: Diagnosis not present

## 2018-02-26 DIAGNOSIS — B259 Cytomegaloviral disease, unspecified: Secondary | ICD-10-CM | POA: Diagnosis not present

## 2018-03-04 ENCOUNTER — Other Ambulatory Visit: Payer: Self-pay | Admitting: Family Medicine

## 2018-03-13 DIAGNOSIS — M79671 Pain in right foot: Secondary | ICD-10-CM | POA: Diagnosis not present

## 2018-03-14 ENCOUNTER — Encounter: Payer: Self-pay | Admitting: Family Medicine

## 2018-05-15 DIAGNOSIS — Z23 Encounter for immunization: Secondary | ICD-10-CM | POA: Diagnosis not present

## 2018-05-17 ENCOUNTER — Telehealth: Payer: Self-pay | Admitting: *Deleted

## 2018-05-17 NOTE — Telephone Encounter (Signed)
Pt advised and voiced understanding. She stated that Dr. Anitra Lauth gave her her last injection.   Apt made for tomorrow at 8:15am.

## 2018-05-17 NOTE — Telephone Encounter (Signed)
OK to make appt with me for this and I can do the steroid injection as long as the orthopedist has not given her one in the last 3 months (the last one I gave her was 08/2017).

## 2018-05-17 NOTE — Telephone Encounter (Signed)
Please advise. Thanks.  

## 2018-05-17 NOTE — Telephone Encounter (Signed)
Copied from [place] [id number]. Topic: Quick Communication - See Telephone Encounter >> May 17, 2018 11:13 AM Antonieta Iba C wrote: CRM for notification. See Telephone encounter for: 05/17/18.  Pt says that she has received injection in her knee. Pt says that she is going out of town next week and she will have to walk a distance. Pt says that provider has done this before for her. Pt would like to know if provider could assist her OR if she should see Ortho?   CB: [id number]

## 2018-05-18 ENCOUNTER — Ambulatory Visit (INDEPENDENT_AMBULATORY_CARE_PROVIDER_SITE_OTHER): Payer: Medicare Other | Admitting: Family Medicine

## 2018-05-18 ENCOUNTER — Encounter: Payer: Self-pay | Admitting: Family Medicine

## 2018-05-18 VITALS — BP 145/89 | HR 76 | Temp 98.0°F | Resp 16 | Ht 65.0 in | Wt 241.5 lb

## 2018-05-18 DIAGNOSIS — M1712 Unilateral primary osteoarthritis, left knee: Secondary | ICD-10-CM

## 2018-05-18 MED ORDER — METHYLPREDNISOLONE ACETATE 80 MG/ML IJ SUSP
80.0000 mg | Freq: Once | INTRAMUSCULAR | Status: AC
  Administered 2018-05-18: 80 mg via INTRAMUSCULAR

## 2018-05-18 NOTE — Progress Notes (Signed)
OFFICE VISIT  05/18/2018   CC:  Chief Complaint  Patient presents with  . Knee Pain   HPI:    Patient is a 72 y.o. Caucasian female who presents for left knee pain secondary to severe osteoarthritis. Desires injection of steroid b/c this has helped in the past. She will be walking a lot in the 10d or so---going to [facility], [place] for a vacation and wants to have her knees feeling as good as they possibly can. Most recent injection of steroids in L knee was 08/16/17.  No recent f/c/malaise.  No recent knee trauma or redness or swelling.  Past Medical History:  Diagnosis Date  . Anemia of chronic kidney failure    r/t kidney function- receives Procrit  . Anxiety   . Cancer [facility])    kidney cancer, left nephrectomy  . Chronic renal insufficiency, stage III (moderate) (HCC) 10/2015   Post-transplant Cr 1.3, GFR 41 as of June 2017 f/u w/ [facility]  . CMV infection [facility]) summer 2017   Valcyte per ID/Renal transplant team  . Diverticulosis    a. 05/2012 colonoscopy  . Erosive lichen planus of vulva    Topical steroids (managed by Dr. Mila Palmer [facility]  . ESRD (end stage renal disease) ([place])    began dialysis 2014--followed by Dr. [name].  Received deceased donor kidney transplant 10/2015.  [name] FSGS (focal segmental glomerulosclerosis)    right kidney; hx of left renal cell cancer and got nephrectomy 1993.  [name] GERD (gastroesophageal reflux disease)   . Gout   . Heart murmur    (Diastolic) ECHO 04/3234 showed that this murmur is coming from pt's R arm AV fistula  . 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  . Hiatal hernia   . History of renal cell cancer 1993   Left nephrectomy  . Hyperlipidemia   . Hypertension   . Hypothyroidism   . Impingement syndrome of left shoulder 04/2016   Dr.  Christy Sartorius to PT  . Metatarsal fracture, pathologic 01/2018   Right; orthocarolina-->post op shoe continued, f/u x-ray planned.  . Osteoarthritis of left knee 08/2017   severe, diffuse, tricompartmental.  Responded to steroid injection.    . Osteoporosis 06/07/2016   DEXA T-score of -3.1.  Fosamax planned 2017 but dental work prevented start..  Pathologic toe fx 12/2017--Repeat DEXA pending.  . Renal transplant recipient Nov 20, 2015   Baseline Cr as of 02/2018= 1.0-1.2.  . SVT (supraventricular tachycardia) (HCC)     Past Surgical History:  Procedure Laterality Date  . AV FISTULA PLACEMENT Right    Right arm: aneurismal dilatation 2017 being followed by CV surgeons  . CARDIOVASCULAR STRESS TEST     03/10/15 ETT ([facility]): Exercise ECG negative at 83% max predicted HR.   [name] COLONOSCOPY  2003; 05/2012   diverticulosis, no polyps.  Recall 10 yrs  . colonoscopy with polypectomy     Dr Henrene Pastor  . DEXA  06/07/2016   T-score -3.1  . KIDNEY TRANSPLANT  11/20/2015   Deceased donor kidney transplant, with Thymoglobulin induction  . LIGATION OF ARTERIOVENOUS  FISTULA Right 02/14/2017   Procedure: LIGATION OF ARTERIOVENOUS  FISTULA;  Surgeon: Angelia Mould, MD;  Location: [place];  Service: Vascular;  Laterality: Right;  . NEPHRECTOMY  1993   for malignancy- left  . RENAL BIOPSY     right  .  RESECTION OF ARTERIOVENOUS FISTULA ANEURYSM Right 02/14/2017   Procedure: EXCISION OF RIGHT BRACHIOCEPHALIC ARTERIOVENOUS FISTULA ANEURYSM;  Surgeon: Angelia Mould, MD;  Location: [place];  Service: Vascular;  Laterality: Right;  . TEE WITHOUT CARDIOVERSION N/A 08/27/2013   Procedure: TRANSESOPHAGEAL ECHOCARDIOGRAM (TEE);  Surgeon: [name], MD;  Location: [facility] ENDOSCOPY;  Service: Cardiovascular;  Laterality: N/A;  . TOTAL ABDOMINAL HYSTERECTOMY W/ BILATERAL SALPINGOOPHORECTOMY  1997   fibroids  . TRANSTHORACIC ECHOCARDIOGRAM     03/10/15 echo ([facility]): LV cavity  normal in size, focal basal hypertrophy, normal systolic function, EF 16% (visual est). Normal wall motion, no regional wall motion abnormalities. Mild diastolic dysfunction with normal LA chamber size. No significant valve stenosis or regurgitation.  . VULVA / PERINEUM BIOPSY  2015    Outpatient Medications Prior to Visit  Medication Sig Dispense Refill  . acetaminophen (TYLENOL) 500 MG tablet Take 500-750 mg by mouth every 6 (six) hours as needed for pain.    [name] aspirin EC 81 MG EC tablet Take 1 tablet (81 mg total) by mouth daily.    Skipper Cliche SALINE NASAL DROPS NA Place 2 sprays into both nostrils daily.    . Camphor-Eucalyptus-Menthol (VICKS VAPORUB EX) Apply 1 application topically daily.    [name] doxazosin (CARDURA) 2 MG tablet Take 2 mg by mouth 2 (two) times daily.     [name] levothyroxine (SYNTHROID, LEVOTHROID) 125 MCG tablet TAKE 1 TABLET BY MOUTH EVERY DAY 90 tablet 1  . loratadine (CLARITIN) 10 MG tablet Take 10 mg by mouth as needed for allergies.     [name] meclizine (ANTIVERT) 25 MG tablet Take 1 tablet (25 mg total) by mouth 3 (three) times daily as needed for dizziness or nausea. 20 tablet 0  . mycophenolate (MYFORTIC) 360 MG TBEC EC tablet Take 360 mg by mouth 2 (two) times daily.    . silver sulfADIAZINE (SILVADENE) 1 % cream Apply 1 application topically daily. Mixes with Triamcinolone cream    . SODIUM BICARBONATE PO Take 650 mg by mouth 2 (two) times daily. Takes 2 in AM and 2 at bedtime    . sulfamethoxazole-trimethoprim (BACTRIM,SEPTRA) 400-80 MG tablet Take 1 tablet by mouth daily.    . tacrolimus (PROGRAF) 1 MG capsule Take 4 mg by mouth 2 (two) times daily.     [name] triamcinolone (NASACORT ALLERGY 24HR) 55 MCG/ACT AERO nasal inhaler Place 2 sprays into the nose daily.    [name] triamcinolone cream (KENALOG) 0.1 % Apply 1 application topically daily.    . verapamil (CALAN-SR) 240 MG CR tablet Take 240 mg by mouth daily.     . calcitRIOL (ROCALTROL) 0.25 MCG capsule Take 1 capsule by mouth  daily.     No facility-administered medications prior to visit.     Allergies  Allergen Reactions  . Cefaclor Rash  . Naproxen Rash  . Enalapril Maleate Cough    Vasotec  . Metoprolol Tartrate Rash    On legs    ROS As per HPI  PE: Blood pressure (!) 145/89, pulse 76, temperature 98 F (36.7 C), temperature source Oral, resp. rate 16, height 5\' 5"  (1.651 m), weight 241 lb 8 oz (109.5 kg), SpO2 96 %. Gen: Alert, well appearing.  Patient is oriented to person, place, time, and situation. AFFECT: pleasant, lucid thought and speech. Left knee with diffuse bony hypertrophy, without warmth or erythema or tenderness.  No effusion. Extends to 180, flexes just past 90 deg No instability.  LABS:  Chemistry      Component Value Date/Time   NA 141 01/04/2018 1020   K 4.2 01/04/2018 1020   CL 106 01/04/2018 1020   CO2 24 01/04/2018 1020   BUN 17 01/04/2018 1020   CREATININE 1.10 01/04/2018 1020   CREATININE 3.61 (H) 09/16/2015 1515      Component Value Date/Time   CALCIUM 9.4 01/04/2018 1020   CALCIUM 8.4 08/30/2012 0819   ALKPHOS 71 12/14/2012 0415   AST 24 12/14/2012 0415   ALT 21 12/14/2012 0415   BILITOT 0.3 12/14/2012 0415     IMPRESSION AND PLAN:  Left knee osteoarthritis: advanced. Steroid injection has helped significantly in the past with function/ability to ambulate more safely. Proceeded with injection today:   Procedure: Therapeutic knee injection.  The patient's clinical condition is marked by substantial pain and/or significant functional disability.  Other conservative therapy has not provided relief, is contraindicated, or not appropriate.  There is a reasonable likelihood that injection will significantly improve the patient's pain and/or functional disability. Cleaned skin with alcohol swab, used anterior approach to enter knee joint, Injected 26ml of 80mg /ml depo medrol + 2 ml of 1% plain lidocaine into joint space without resistance.  No immediate  complications.  Patient tolerated procedure well.  Post-injection care discussed, including 20 min of icing 1-2 times in the next 4-8 hours, frequent non weight-bearing ROM exercises over the next few days, and general pain medication management.  An After Visit Summary was printed and given to the patient.  FOLLOW UP: Return for as needed.  Signed:  Crissie Sickles, MD           05/18/2018

## 2018-05-18 NOTE — Addendum Note (Signed)
Addended by: Onalee Hua on: 05/18/2018 08:54 AM   Modules accepted: Orders

## 2018-05-22 DIAGNOSIS — Z940 Kidney transplant status: Secondary | ICD-10-CM | POA: Diagnosis not present

## 2018-05-22 DIAGNOSIS — I129 Hypertensive chronic kidney disease with stage 1 through stage 4 chronic kidney disease, or unspecified chronic kidney disease: Secondary | ICD-10-CM | POA: Diagnosis not present

## 2018-05-23 ENCOUNTER — Ambulatory Visit: Payer: Self-pay | Admitting: *Deleted

## 2018-05-23 DIAGNOSIS — R0981 Nasal congestion: Secondary | ICD-10-CM | POA: Diagnosis not present

## 2018-05-23 DIAGNOSIS — Z881 Allergy status to other antibiotic agents status: Secondary | ICD-10-CM | POA: Diagnosis not present

## 2018-05-23 DIAGNOSIS — Z888 Allergy status to other drugs, medicaments and biological substances status: Secondary | ICD-10-CM | POA: Diagnosis not present

## 2018-05-23 DIAGNOSIS — I10 Essential (primary) hypertension: Secondary | ICD-10-CM | POA: Diagnosis not present

## 2018-05-23 DIAGNOSIS — R450 Nervousness: Secondary | ICD-10-CM | POA: Diagnosis not present

## 2018-05-23 DIAGNOSIS — I440 Atrioventricular block, first degree: Secondary | ICD-10-CM | POA: Diagnosis not present

## 2018-05-23 DIAGNOSIS — Z886 Allergy status to analgesic agent status: Secondary | ICD-10-CM | POA: Diagnosis not present

## 2018-05-23 DIAGNOSIS — Z79899 Other long term (current) drug therapy: Secondary | ICD-10-CM | POA: Diagnosis not present

## 2018-05-23 NOTE — Telephone Encounter (Signed)
Pt called because her blood pressure machine says her heart rate is 165; her blood pressure is 162/101 at 0915; and 169/104 0900; readings taken on left upper arm; she says when she says these she gets nervous; she also says that she thinks that her machine is broken, and she would like to have her blood pressure checked; she missed her morning dose of verapamil on 05/22/18; the pt would like to get her blood pressure checked, and says that she's "feeling just fine but nervous because of her readings"; recommendations made per nurse triage protocol; the pt normally sees Dr Anitra Lauth, [facility], but there is no availability in the office today; the pt says that she is leaving to go to [place] today; spoke with Diane at [facility] and she would like to have this information routed to the office for provider review; the pt can be contacted at [phone]; the pt verbalizes understanding that she will get a call back from the office today.    Reason for Disposition . Systolic BP  >= 676 OR Diastolic >= 195  Answer Assessment - Initial Assessment Questions 1. BLOOD PRESSURE: "What is the blood pressure?" "Did you take at least two measurements 5 minutes apart?"     169/104 and 162/101 2. ONSET: "When did you take your blood pressure?"     05/23/18 at 0900 and 0915 3. HOW: "How did you obtain the blood pressure?" (e.g., visiting nurse, automatic home BP monitor)     Home BP cuff on left upper arm  4. HISTORY: "Do you have a history of high blood pressure?"     yes 5. MEDICATIONS: "Are you taking any medications for blood pressure?" "Have you missed any doses recently?"     Yes; am dose of verapamil 05/22/18 6. OTHER SYMPTOMS: "Do you have any symptoms?" (e.g., headache, chest pain, blurred vision, difficulty breathing, weakness)     No; nervous because of readings 7. PREGNANCY: "Is there any chance you are pregnant?" "When was your last menstrual period?"     no  Protocols used: HIGH BLOOD  PRESSURE-A-AH

## 2018-05-23 NOTE — Telephone Encounter (Signed)
Pls call pt and see how she is feeling. Ask pt if she was feeling her heart racing or palpitating when her machine said her HR was 165? ? Dizziness?, SOB?, CP? Does she have any anxiety pills?  Did she take her verapamil today? Let me know-thx

## 2018-05-23 NOTE — Telephone Encounter (Signed)
Pt called back and stated that she went to the [facility]. They told her everything was fine. She's feeling better now.

## 2018-05-23 NOTE — Telephone Encounter (Signed)
Please advise. Thanks.  

## 2018-05-23 NOTE — Telephone Encounter (Signed)
Left message for pt to call back  °

## 2018-05-30 DIAGNOSIS — H43393 Other vitreous opacities, bilateral: Secondary | ICD-10-CM | POA: Diagnosis not present

## 2018-05-30 DIAGNOSIS — H11153 Pinguecula, bilateral: Secondary | ICD-10-CM | POA: Diagnosis not present

## 2018-05-30 DIAGNOSIS — H2513 Age-related nuclear cataract, bilateral: Secondary | ICD-10-CM | POA: Diagnosis not present

## 2018-06-11 ENCOUNTER — Other Ambulatory Visit: Payer: Self-pay

## 2018-06-11 ENCOUNTER — Ambulatory Visit (INDEPENDENT_AMBULATORY_CARE_PROVIDER_SITE_OTHER): Payer: Medicare Other

## 2018-06-11 VITALS — BP 160/90 | HR 72 | Ht 65.0 in | Wt 240.5 lb

## 2018-06-11 DIAGNOSIS — E669 Obesity, unspecified: Secondary | ICD-10-CM

## 2018-06-11 DIAGNOSIS — E2839 Other primary ovarian failure: Secondary | ICD-10-CM | POA: Diagnosis not present

## 2018-06-11 DIAGNOSIS — Z1239 Encounter for other screening for malignant neoplasm of breast: Secondary | ICD-10-CM

## 2018-06-11 DIAGNOSIS — Z0000 Encounter for general adult medical examination without abnormal findings: Secondary | ICD-10-CM

## 2018-06-11 NOTE — Progress Notes (Signed)
Subjective:   Tabitha Green is a 72 y.o. female who presents for Medicare Annual (Subsequent) preventive examination.  Review of Systems:  No ROS.  Medicare Wellness Visit. Additional risk factors are reflected in the social history.  Cardiac Risk Factors include: advanced age (>12men, >64 women);dyslipidemia;hypertension;family history of premature cardiovascular disease;sedentary lifestyle;obesity (BMI >30kg/m2);smoking/ tobacco exposure   Sleep patterns: Sleeps 6 hours, interrupted frequently. Up to void x 3.  Home Safety/Smoke Alarms: Feels safe in home. Smoke alarms in place.  Living environment; residence and Firearm Safety: Lives with husband in 1 story home. No rail at steps at door.  Seat Belt Safety/Bike Helmet: Wears seat belt.   Female:   Pap-N/A       Mammo-06/22/2017, BI-RADS CATEGORY  1: Negative. Ordered today.        Dexa scan-06/07/2016, osteoporosis. Ordered today.          CCS-Colonoscopy 06/07/2012, normal.      Objective:     Vitals: BP (!) 170/110 (BP Location: Left Arm, Patient Position: Sitting, Cuff Size: Normal)   Pulse 72   Ht 5\' 5"  (1.651 m)   Wt 240 lb 8 oz (109.1 kg)   SpO2 97%   BMI 40.02 kg/m   Body mass index is 40.02 kg/m.  Advanced Directives 06/11/2018 06/06/2017 03/15/2017 02/22/2017 02/08/2017 02/08/2017 05/11/2016  Does Patient Have a Medical Advance Directive? Yes No No No No No No  Type of Advance Directive Living will;Healthcare Power of Attorney - - - - - -  Copy of [place] in Chart? No - copy requested - - - - - -  Would patient like information on creating a medical advance directive? - No - Patient declined - - No - Patient declined - No - patient declined information  Pre-existing out of facility DNR order (yellow form or pink MOST form) - - - - - - -    Tobacco Social History   Tobacco Use  Smoking Status Never Smoker  Smokeless Tobacco Never Used     Counseling given: Not Answered   Past Medical  History:  Diagnosis Date  . Anemia of chronic kidney failure    r/t kidney function- receives Procrit  . Anxiety   . Cancer [facility])    kidney cancer, left nephrectomy  . Chronic renal insufficiency, stage III (moderate) (HCC) 10/2015   Post-transplant Cr 1.3, GFR 41 as of June 2017 f/u w/ [facility]  . CMV infection [facility]) summer 2017   Valcyte per ID/Renal transplant team  . Diverticulosis    a. 05/2012 colonoscopy  . Erosive lichen planus of vulva    Topical steroids (managed by Dr. Mila Palmer [facility] via [facility] outpt services.  . ESRD (end stage renal disease) ([place])    began dialysis 2014--followed by Dr. [name].  Received deceased donor kidney transplant 10/2015.  [name] FSGS (focal segmental glomerulosclerosis)    right kidney; hx of left renal cell cancer and got nephrectomy 1993.  [name] GERD (gastroesophageal reflux disease)   . Gout   . Heart murmur    (Diastolic) ECHO 01/2562 showed that this murmur is coming from pt's R arm AV fistula  . 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  . Hiatal hernia   . History of renal cell cancer 1993   Left nephrectomy  . Hyperlipidemia   . Hypertension   . Hypothyroidism   .  Impingement syndrome of left shoulder 04/2016   Dr. Christy Sartorius to PT  . Metatarsal fracture, pathologic 01/2018   Right; orthocarolina-->post op shoe continued, f/u x-ray planned.  . Osteoarthritis of left knee 08/2017   severe, diffuse, tricompartmental.  Responded to steroid injection.    . Osteoporosis 06/07/2016   DEXA T-score of -3.1.  Fosamax planned 2017 but dental work prevented start..  Pathologic toe fx 12/2017--Repeat DEXA pending.  . Renal transplant recipient 2015-12-04   Baseline Cr as of 02/2018= 1.0-1.2.  . SVT (supraventricular tachycardia) (HCC)    Past Surgical History:  Procedure  Laterality Date  . AV FISTULA PLACEMENT Right    Right arm: aneurismal dilatation 2017 being followed by CV surgeons  . CARDIOVASCULAR STRESS TEST     03/10/15 ETT ([facility]): Exercise ECG negative at 83% max predicted HR.   [name] COLONOSCOPY  2003; 05/2012   diverticulosis, no polyps.  Recall 10 yrs  . colonoscopy with polypectomy     Dr Henrene Pastor  . DEXA  06/07/2016   T-score -3.1  . KIDNEY TRANSPLANT  2015/12/04   Deceased donor kidney transplant, with Thymoglobulin induction  . LIGATION OF ARTERIOVENOUS  FISTULA Right 02/14/2017   Procedure: LIGATION OF ARTERIOVENOUS  FISTULA;  Surgeon: Angelia Mould, MD;  Location: [place];  Service: Vascular;  Laterality: Right;  . NEPHRECTOMY  1993   for malignancy- left  . RENAL BIOPSY     right  . RESECTION OF ARTERIOVENOUS FISTULA ANEURYSM Right 02/14/2017   Procedure: EXCISION OF RIGHT BRACHIOCEPHALIC ARTERIOVENOUS FISTULA ANEURYSM;  Surgeon: Angelia Mould, MD;  Location: [place];  Service: Vascular;  Laterality: Right;  . TEE WITHOUT CARDIOVERSION N/A 08/27/2013   Procedure: TRANSESOPHAGEAL ECHOCARDIOGRAM (TEE);  Surgeon: [name], MD;  Location: [facility] ENDOSCOPY;  Service: Cardiovascular;  Laterality: N/A;  . TOTAL ABDOMINAL HYSTERECTOMY W/ BILATERAL SALPINGOOPHORECTOMY  1997   fibroids  . TRANSTHORACIC ECHOCARDIOGRAM     03/10/15 echo ([facility], [facility]): LV cavity normal in size, focal basal hypertrophy, normal systolic function, EF 62% (visual est). Normal wall motion, no regional wall motion abnormalities. Mild diastolic dysfunction with normal LA chamber size. No significant valve stenosis or regurgitation.  . VULVA / PERINEUM BIOPSY  2015   Family History  Problem Relation Age of Onset  . Deep vein thrombosis Mother        post thyroid surgery  . Hypertension Mother   . Heart attack Father 46       deceased  . Hypertension Father   . Cancer Sister        breast  . Cancer Paternal Aunt        pancreatic  .  Diabetes Maternal Grandfather   . Stroke Paternal Grandmother        in 30s  . Colon cancer Neg Hx   . Esophageal cancer Neg Hx   . Stomach cancer Neg Hx   . Rectal cancer Neg Hx    Social History   Socioeconomic History  . Marital status: Married    Spouse name: Not on file  . Number of children: 2  . Years of education: Not on file  . Highest education level: Not on file  Occupational History  . Occupation: Retired  Scientific laboratory technician  . Financial resource strain: Not on file  . Food insecurity:    Worry: Not on file    Inability: Not on file  . Transportation needs:    Medical: Not on file  Non-medical: Not on file  Tobacco Use  . Smoking status: Never Smoker  . Smokeless tobacco: Never Used  Substance and Sexual Activity  . Alcohol use: Yes    Comment: rarely  . Drug use: No  . Sexual activity: Not Currently    Birth control/protection: Surgical  Lifestyle  . Physical activity:    Days per week: Not on file    Minutes per session: Not on file  . Stress: Not on file  Relationships  . Social connections:    Talks on phone: Not on file    Gets together: Not on file    Attends religious service: Not on file    Active member of club or organization: Not on file    Attends meetings of clubs or organizations: Not on file    Relationship status: Not on file  Other Topics Concern  . Not on file  Social History Narrative   Lives in [place] with husband.  Retired.  Previously worked in [employer] @ [employer]   No T/A/Ds.    Outpatient Encounter Medications as of 06/11/2018  Medication Sig  . acetaminophen (TYLENOL) 500 MG tablet Take 500-750 mg by mouth every 6 (six) hours as needed for pain.  [name] aspirin EC 81 MG EC tablet Take 1 tablet (81 mg total) by mouth daily.  Skipper Cliche SALINE NASAL DROPS NA Place 2 sprays into both nostrils daily.  . Camphor-Eucalyptus-Menthol (VICKS VAPORUB EX) Apply 1 application topically daily.  [name] doxazosin (CARDURA) 2 MG tablet Take 2 mg by  mouth 2 (two) times daily.   [name] levothyroxine (SYNTHROID, LEVOTHROID) 125 MCG tablet TAKE 1 TABLET BY MOUTH EVERY DAY  . loratadine (CLARITIN) 10 MG tablet Take 10 mg by mouth as needed for allergies.   [name] meclizine (ANTIVERT) 25 MG tablet Take 1 tablet (25 mg total) by mouth 3 (three) times daily as needed for dizziness or nausea.  . mycophenolate (MYFORTIC) 360 MG TBEC EC tablet Take 360 mg by mouth 2 (two) times daily.  . silver sulfADIAZINE (SILVADENE) 1 % cream Apply 1 application topically daily. Mixes with Triamcinolone cream  . SODIUM BICARBONATE PO Take 650 mg by mouth 2 (two) times daily. Takes 2 in AM and 2 at bedtime  . sulfamethoxazole-trimethoprim (BACTRIM,SEPTRA) 400-80 MG tablet Take 1 tablet by mouth daily.  . tacrolimus (PROGRAF) 1 MG capsule Take 4 mg by mouth 2 (two) times daily.   [name] triamcinolone (NASACORT ALLERGY 24HR) 55 MCG/ACT AERO nasal inhaler Place 2 sprays into the nose daily.  [name] triamcinolone cream (KENALOG) 0.1 % Apply 1 application topically daily.  . verapamil (CALAN-SR) 240 MG CR tablet Take 240 mg by mouth daily.   [name] ALPRAZolam (XANAX) 0.25 MG tablet Take by mouth.  . calcitRIOL (ROCALTROL) 0.25 MCG capsule Take 1 capsule by mouth daily.  . fluticasone (FLONASE) 50 MCG/ACT nasal spray one spray by Both Nostrils route daily.   No facility-administered encounter medications on file as of 06/11/2018.     Activities of Daily Living In your present state of health, do you have any difficulty performing the following activities: 06/11/2018  Hearing? N  Vision? N  Difficulty concentrating or making decisions? N  Walking or climbing stairs? N  Dressing or bathing? N  Doing errands, shopping? N  Preparing Food and eating ? N  Using the Toilet? N  In the past six months, have you accidently leaked urine? N  Do you have problems with loss of bowel control? N  Managing your  Medications? N  Managing your Finances? N  Housekeeping or managing your Housekeeping? N    Some recent data might be hidden    Patient Care Team: Tammi Sou, MD as PCP - General (Family Medicine) Irene Shipper, MD as Consulting Physician (Gastroenterology) Estanislado Emms, MD as Consulting Physician (Nephrology) Dene Gentry, MD as Consulting Physician (Sports Medicine) Elam Dutch, MD as Consulting Physician (Vascular Surgery) Angelia Mould, MD as Consulting Physician (Vascular Surgery) Florinda Marker as Physician Assistant (Orthopedic Surgery) [facility], Mila Palmer, MD as Consulting Physician (Dermatology) Santiago Glad, PA-C as Consulting Physician (Orthopedic Surgery) Santiago Glad, PA-C as Consulting Physician (Orthopedic Surgery) Evans Lance, MD as Consulting Physician (Cardiology)    Assessment:   This is a routine wellness examination for Tabitha Green.  Exercise Activities and Dietary recommendations Current Exercise Habits: The patient does not participate in regular exercise at present, Exercise limited by: None identified   Diet (meal preparation, eat out, water intake, caffeinated beverages, dairy products, fruits and vegetables): Drinks tea, water and Cola  Breakfast: eggs, sausage, coffee Lunch: sandwich, eats out several times Dinner: eats out; meat and veggies.    Goals    . Increase physical activity     Increase activity by starting exercise class at church (silver sneakers).     . Increase physical activity     Increase activity by attending Silver Sneakers classes.        Fall Risk Fall Risk  06/11/2018 06/06/2017 04/18/2016 04/18/2016 11/05/2015  Falls in the past year? 0 No No No Yes  Number falls in past yr: - - - - 1  Injury with Fall? - - - - No    Depression Screen PHQ 2/9 Scores 06/11/2018 06/06/2017 04/18/2016 04/18/2016  PHQ - 2 Score 0 0 0 1     Cognitive Function MMSE - Mini Mental State Exam 06/11/2018 06/06/2017  Orientation to time 5 5  Orientation to Place 5 5  Registration 3 3   Attention/ Calculation 5 5  Recall 2 3  Language- name 2 objects 2 2  Language- repeat 1 1  Language- follow 3 step command 3 3  Language- read & follow direction 1 1  Write a sentence 1 1  Copy design 1 1  Total score 29 30        Immunization History  Administered Date(s) Administered  . Influenza Split 06/01/2011  . Influenza Whole 06/11/2008, 05/25/2009, 06/09/2010  . Influenza, High Dose Seasonal PF 05/08/2013, 06/06/2017  . Influenza-Unspecified 05/18/2015, 05/15/2018  . PPD Test 06/01/2011  . Pneumococcal Conjugate-13 06/06/2017  . Pneumococcal Polysaccharide-23 01/06/2013  . Tdap 09/13/2011  . Varicella 12/24/2013  . Zoster 01/23/2014   Declines Shingrix.   Screening Tests Health Maintenance  Topic Date Due  . MAMMOGRAM  06/23/2019  . TETANUS/TDAP  09/12/2021  . COLONOSCOPY  06/07/2022  . INFLUENZA VACCINE  Completed  . DEXA SCAN  Completed  . Hepatitis C Screening  Completed  . PNA vac Low Risk Adult  Completed        Plan:    Schedule mammogram and bone scan after 06/23/18.   Bring a copy of your living will and/or healthcare power of attorney to your next office visit.  Continue doing brain stimulating activities (puzzles, reading, adult coloring books, staying active) to keep memory sharp.   I have personally reviewed and noted the following in the patient's chart:   . Medical and social history . Use of alcohol, tobacco or illicit  drugs  . Current medications and supplements . Functional ability and status . Nutritional status . Physical activity . Advanced directives . List of other physicians . Hospitalizations, surgeries, and ER visits in previous 12 months . Vitals . Screenings to include cognitive, depression, and falls . Referrals and appointments  In addition, I have reviewed and discussed with patient certain preventive protocols, quality metrics, and best practice recommendations. A written personalized care plan for preventive  services as well as general preventive health recommendations were provided to patient.     Gerilyn Nestle, RN  06/11/2018   PCP Notes: -BP elevated (170/110, 160/90). Pt states she just took antihypertensive. Advised to take BP daily, bring readings to next appt. Also advised if she experiences dizziness, headache, or CP to go to ER. -F/U with PCP Thursday, 06/14/18.

## 2018-06-11 NOTE — Patient Instructions (Addendum)
Schedule mammogram and bone scan after 06/23/18.   Bring a copy of your living will and/or healthcare power of attorney to your next office visit.  Continue doing brain stimulating activities (puzzles, reading, adult coloring books, staying active) to keep memory sharp.   Health Maintenance, Female Adopting a healthy lifestyle and getting preventive care can go a long way to promote health and wellness. Talk with your health care provider about what schedule of regular examinations is right for you. This is a good chance for you to check in with your provider about disease prevention and staying healthy. In between checkups, there are plenty of things you can do on your own. Experts have done a lot of research about which lifestyle changes and preventive measures are most likely to keep you healthy. Ask your health care provider for more information. Weight and diet Eat a healthy diet  Be sure to include plenty of vegetables, fruits, low-fat dairy products, and lean protein.  Do not eat a lot of foods high in solid fats, added sugars, or salt.  Get regular exercise. This is one of the most important things you can do for your health. ? Most adults should exercise for at least 150 minutes each week. The exercise should increase your heart rate and make you sweat (moderate-intensity exercise). ? Most adults should also do strengthening exercises at least twice a week. This is in addition to the moderate-intensity exercise.  Maintain a healthy weight  Body mass index (BMI) is a measurement that can be used to identify possible weight problems. It estimates body fat based on height and weight. Your health care provider can help determine your BMI and help you achieve or maintain a healthy weight.  For females 48 years of age and older: ? A BMI below 18.5 is considered underweight. ? A BMI of 18.5 to 24.9 is normal. ? A BMI of 25 to 29.9 is considered overweight. ? A BMI of 30 and above is  considered obese.  Watch levels of cholesterol and blood lipids  You should start having your blood tested for lipids and cholesterol at 72 years of age, then have this test every 5 years.  You may need to have your cholesterol levels checked more often if: ? Your lipid or cholesterol levels are high. ? You are older than 72 years of age. ? You are at high risk for heart disease.  Cancer screening Lung Cancer  Lung cancer screening is recommended for adults 49-77 years old who are at high risk for lung cancer because of a history of smoking.  A yearly low-dose CT scan of the lungs is recommended for people who: ? Currently smoke. ? Have quit within the past 15 years. ? Have at least a 30-pack-year history of smoking. A pack year is smoking an average of one pack of cigarettes a day for 1 year.  Yearly screening should continue until it has been 15 years since you quit.  Yearly screening should stop if you develop a health problem that would prevent you from having lung cancer treatment.  Breast Cancer  Practice breast self-awareness. This means understanding how your breasts normally appear and feel.  It also means doing regular breast self-exams. Let your health care provider know about any changes, no matter how small.  If you are in your 20s or 30s, you should have a clinical breast exam (CBE) by a health care provider every 1-3 years as part of a regular health exam.  If  you are 40 or older, have a CBE every year. Also consider having a breast X-ray (mammogram) every year.  If you have a family history of breast cancer, talk to your health care provider about genetic screening.  If you are at high risk for breast cancer, talk to your health care provider about having an MRI and a mammogram every year.  Breast cancer gene (BRCA) assessment is recommended for women who have family members with BRCA-related cancers. BRCA-related cancers  include: ? Breast. ? Ovarian. ? Tubal. ? Peritoneal cancers.  Results of the assessment will determine the need for genetic counseling and BRCA1 and BRCA2 testing.  Cervical Cancer Your health care provider may recommend that you be screened regularly for cancer of the pelvic organs (ovaries, uterus, and vagina). This screening involves a pelvic examination, including checking for microscopic changes to the surface of your cervix (Pap test). You may be encouraged to have this screening done every 3 years, beginning at age 74.  For women ages 36-65, health care providers may recommend pelvic exams and Pap testing every 3 years, or they may recommend the Pap and pelvic exam, combined with testing for human papilloma virus (HPV), every 5 years. Some types of HPV increase your risk of cervical cancer. Testing for HPV may also be done on women of any age with unclear Pap test results.  Other health care providers may not recommend any screening for nonpregnant women who are considered low risk for pelvic cancer and who do not have symptoms. Ask your health care provider if a screening pelvic exam is right for you.  If you have had past treatment for cervical cancer or a condition that could lead to cancer, you need Pap tests and screening for cancer for at least 20 years after your treatment. If Pap tests have been discontinued, your risk factors (such as having a new sexual partner) need to be reassessed to determine if screening should resume. Some women have medical problems that increase the chance of getting cervical cancer. In these cases, your health care provider may recommend more frequent screening and Pap tests.  Colorectal Cancer  This type of cancer can be detected and often prevented.  Routine colorectal cancer screening usually begins at 72 years of age and continues through 72 years of age.  Your health care provider may recommend screening at an earlier age if you have risk factors  for colon cancer.  Your health care provider may also recommend using home test kits to check for hidden blood in the stool.  A small camera at the end of a tube can be used to examine your colon directly (sigmoidoscopy or colonoscopy). This is done to check for the earliest forms of colorectal cancer.  Routine screening usually begins at age 66.  Direct examination of the colon should be repeated every 5-10 years through 72 years of age. However, you may need to be screened more often if early forms of precancerous polyps or small growths are found.  Skin Cancer  Check your skin from head to toe regularly.  Tell your health care provider about any new moles or changes in moles, especially if there is a change in a mole's shape or color.  Also tell your health care provider if you have a mole that is larger than the size of a pencil eraser.  Always use sunscreen. Apply sunscreen liberally and repeatedly throughout the day.  Protect yourself by wearing long sleeves, pants, a wide-brimmed hat, and sunglasses  whenever you are outside.  Heart disease, diabetes, and high blood pressure  High blood pressure causes heart disease and increases the risk of stroke. High blood pressure is more likely to develop in: ? People who have blood pressure in the high end of the normal range (130-139/85-89 mm Hg). ? People who are overweight or obese. ? People who are African American.  If you are 64-28 years of age, have your blood pressure checked every 3-5 years. If you are 58 years of age or older, have your blood pressure checked every year. You should have your blood pressure measured twice-once when you are at a hospital or clinic, and once when you are not at a hospital or clinic. Record the average of the two measurements. To check your blood pressure when you are not at a hospital or clinic, you can use: ? An automated blood pressure machine at a pharmacy. ? A home blood pressure monitor.  If  you are between 46 years and 42 years old, ask your health care provider if you should take aspirin to prevent strokes.  Have regular diabetes screenings. This involves taking a blood sample to check your fasting blood sugar level. ? If you are at a normal weight and have a low risk for diabetes, have this test once every three years after 71 years of age. ? If you are overweight and have a high risk for diabetes, consider being tested at a younger age or more often. Preventing infection Hepatitis B  If you have a higher risk for hepatitis B, you should be screened for this virus. You are considered at high risk for hepatitis B if: ? You were born in a country where hepatitis B is common. Ask your health care provider which countries are considered high risk. ? Your parents were born in a high-risk country, and you have not been immunized against hepatitis B (hepatitis B vaccine). ? You have HIV or AIDS. ? You use needles to inject street drugs. ? You live with someone who has hepatitis B. ? You have had sex with someone who has hepatitis B. ? You get hemodialysis treatment. ? You take certain medicines for conditions, including cancer, organ transplantation, and autoimmune conditions.  Hepatitis C  Blood testing is recommended for: ? Everyone born from [birth year] through 1965. ? Anyone with known risk factors for hepatitis C.  Sexually transmitted infections (STIs)  You should be screened for sexually transmitted infections (STIs) including gonorrhea and chlamydia if: ? You are sexually active and are younger than 72 years of age. ? You are older than 72 years of age and your health care provider tells you that you are at risk for this type of infection. ? Your sexual activity has changed since you were last screened and you are at an increased risk for chlamydia or gonorrhea. Ask your health care provider if you are at risk.  If you do not have HIV, but are at risk, it may be recommended  that you take a prescription medicine daily to prevent HIV infection. This is called pre-exposure prophylaxis (PrEP). You are considered at risk if: ? You are sexually active and do not regularly use condoms or know the HIV status of your partner(s). ? You take drugs by injection. ? You are sexually active with a partner who has HIV.  Talk with your health care provider about whether you are at high risk of being infected with HIV. If you choose to begin PrEP, you should  first be tested for HIV. You should then be tested every 3 months for as long as you are taking PrEP. Pregnancy  If you are premenopausal and you may become pregnant, ask your health care provider about preconception counseling.  If you may become pregnant, take 400 to 800 micrograms (mcg) of folic acid every day.  If you want to prevent pregnancy, talk to your health care provider about birth control (contraception). Osteoporosis and menopause  Osteoporosis is a disease in which the bones lose minerals and strength with aging. This can result in serious bone fractures. Your risk for osteoporosis can be identified using a bone density scan.  If you are 38 years of age or older, or if you are at risk for osteoporosis and fractures, ask your health care provider if you should be screened.  Ask your health care provider whether you should take a calcium or vitamin D supplement to lower your risk for osteoporosis.  Menopause may have certain physical symptoms and risks.  Hormone replacement therapy may reduce some of these symptoms and risks. Talk to your health care provider about whether hormone replacement therapy is right for you. Follow these instructions at home:  Schedule regular health, dental, and eye exams.  Stay current with your immunizations.  Do not use any tobacco products including cigarettes, chewing tobacco, or electronic cigarettes.  If you are pregnant, do not drink alcohol.  If you are  breastfeeding, limit how much and how often you drink alcohol.  Limit alcohol intake to no more than 1 drink per day for nonpregnant women. One drink equals 12 ounces of beer, 5 ounces of wine, or 1 ounces of hard liquor.  Do not use street drugs.  Do not share needles.  Ask your health care provider for help if you need support or information about quitting drugs.  Tell your health care provider if you often feel depressed.  Tell your health care provider if you have ever been abused or do not feel safe at home. This information is not intended to replace advice given to you by your health care provider. Make sure you discuss any questions you have with your health care provider. Document Released: 02/07/2011 Document Revised: 12/31/2015 Document Reviewed: 04/28/2015 Elsevier Interactive Patient Education  [employer].

## 2018-06-14 ENCOUNTER — Other Ambulatory Visit: Payer: Self-pay | Admitting: Family Medicine

## 2018-06-14 ENCOUNTER — Ambulatory Visit (INDEPENDENT_AMBULATORY_CARE_PROVIDER_SITE_OTHER): Payer: Medicare Other | Admitting: Family Medicine

## 2018-06-14 ENCOUNTER — Encounter: Payer: Self-pay | Admitting: Family Medicine

## 2018-06-14 VITALS — BP 140/90 | HR 82 | Temp 98.3°F | Resp 16 | Ht 65.0 in | Wt 241.4 lb

## 2018-06-14 DIAGNOSIS — I10 Essential (primary) hypertension: Secondary | ICD-10-CM | POA: Diagnosis not present

## 2018-06-14 LAB — BASIC METABOLIC PANEL
BUN: 20 mg/dL (ref 6–23)
CALCIUM: 9.7 mg/dL (ref 8.4–10.5)
CHLORIDE: 106 meq/L (ref 96–112)
CO2: 28 mEq/L (ref 19–32)
Creatinine, Ser: 0.98 mg/dL (ref 0.40–1.20)
GFR: 59.16 mL/min — AB (ref >60.00)
Glucose, Bld: 102 mg/dL — ABNORMAL HIGH (ref 70–99)
POTASSIUM: 4.2 meq/L (ref 3.5–5.1)
Sodium: 141 mEq/L (ref 135–145)

## 2018-06-14 MED ORDER — LOSARTAN POTASSIUM 50 MG PO TABS: 50.0000 mg | ORAL_TABLET | Freq: Every day | ORAL | 0 refills | Status: DC

## 2018-06-14 NOTE — Progress Notes (Signed)
OFFICE VISIT  06/14/2018   CC:  Chief Complaint  Patient presents with  . Elevated BP at [facility]     HPI:    Patient is a 72 y.o. Caucasian female who presents for f/u of elevated bp reading that she had when seen here 3 d/a for her Annual Wellness exam. BP elevated (170/110, 160/90). Pt had just taken her antihypertensive. Advised to take BP daily, bring readings to next appt.  Interim hx: Pt states hx of 3 wks of bp being consistently 160/100 range.  No dizziness, HA's, or vision c/o. Not taking any decongestants. Has had some sinus congestion but o/w feeling well. She has not had her bp cuff checked against a manual cuff in MD office. Looking back at bp's at o/v's over the last year, it has not been uncommon for her to have bp up to 035-009 systolic and high 38H diastolic.  About 2 times a month she says she feels her heart beat rapidly for a couple minutes but has no SOB, CP, dizziness, or preceding anxiety.  No CP, HAs, SOB, LE edema, or dizziness.   Past Medical History:  Diagnosis Date  . Anemia of chronic kidney failure    r/t kidney function- receives Procrit  . Anxiety   . Cancer [facility])    kidney cancer, left nephrectomy  . Chronic renal insufficiency, stage III (moderate) (HCC) 10/2015   Post-transplant Cr 1.3, GFR 41 as of June 2017 f/u w/ [facility]  . CMV infection [facility]) summer 2017   Valcyte per ID/Renal transplant team  . Diverticulosis    a. 05/2012 colonoscopy  . Erosive lichen planus of vulva    Topical steroids (managed by Dr. Mila Palmer [facility] via [facility].  . ESRD (end stage renal disease) ([place])    began dialysis 2014--followed by Dr. [name].  Received deceased donor kidney transplant 10/2015.  [name] FSGS (focal segmental glomerulosclerosis)    right kidney; hx of left renal cell cancer and got nephrectomy 1993.  [name] GERD (gastroesophageal reflux disease)   . Gout   . Heart murmur    (Diastolic) ECHO 03/2992  showed that this murmur is coming from pt's R arm AV fistula  . 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  . Hiatal hernia   . History of renal cell cancer 1993   Left nephrectomy  . Hyperlipidemia   . Hypertension   . Hypothyroidism   . Impingement syndrome of left shoulder 04/2016   Dr. Christy Sartorius to PT  . Metatarsal fracture, pathologic 01/2018   Right; orthocarolina-->post op shoe continued, f/u x-ray planned.  . Osteoarthritis of left knee 08/2017   severe, diffuse, tricompartmental.  Responded to steroid injection.    . Osteoporosis 06/07/2016   DEXA T-score of -3.1.  Fosamax planned 2017 but dental work prevented start..  Pathologic toe fx 12/2017--Repeat DEXA pending.  . Renal transplant recipient 11/06/2015   Baseline Cr as of 02/2018= 1.0-1.2.  . SVT (supraventricular tachycardia) (HCC)     Past Surgical History:  Procedure Laterality Date  . AV FISTULA PLACEMENT Right    Right arm: aneurismal dilatation 2017 being followed by CV surgeons  . CARDIOVASCULAR STRESS TEST     03/10/15 ETT ([facility]): Exercise ECG negative at 83% max predicted HR.   [name] COLONOSCOPY  2003; 05/2012   diverticulosis, no polyps.  Recall 10 yrs  .  colonoscopy with polypectomy     Dr Henrene Pastor  . DEXA  06/07/2016   T-score -3.1  . KIDNEY TRANSPLANT  16-Nov-2015   Deceased donor kidney transplant, with Thymoglobulin induction  . LIGATION OF ARTERIOVENOUS  FISTULA Right 02/14/2017   Procedure: LIGATION OF ARTERIOVENOUS  FISTULA;  Surgeon: Angelia Mould, MD;  Location: [facility];  Service: Vascular;  Laterality: Right;  . NEPHRECTOMY  1993   for malignancy- left  . RENAL BIOPSY     right  . RESECTION OF ARTERIOVENOUS FISTULA ANEURYSM Right 02/14/2017   Procedure: EXCISION OF RIGHT BRACHIOCEPHALIC ARTERIOVENOUS FISTULA ANEURYSM;  Surgeon: Angelia Mould, MD;   Location: [place];  Service: Vascular;  Laterality: Right;  . TEE WITHOUT CARDIOVERSION N/A 08/27/2013   Procedure: TRANSESOPHAGEAL ECHOCARDIOGRAM (TEE);  Surgeon: [name], MD;  Location: [facility] ENDOSCOPY;  Service: Cardiovascular;  Laterality: N/A;  . TOTAL ABDOMINAL HYSTERECTOMY W/ BILATERAL SALPINGOOPHORECTOMY  1997   fibroids  . TRANSTHORACIC ECHOCARDIOGRAM     03/10/15 echo ([facility]): LV cavity normal in size, focal basal hypertrophy, normal systolic function, EF 90% (visual est). Normal wall motion, no regional wall motion abnormalities. Mild diastolic dysfunction with normal LA chamber size. No significant valve stenosis or regurgitation.  . VULVA / PERINEUM BIOPSY  2015    Outpatient Medications Prior to Visit  Medication Sig Dispense Refill  . acetaminophen (TYLENOL) 500 MG tablet Take 500-750 mg by mouth every 6 (six) hours as needed for pain.    [name] aspirin EC 81 MG EC tablet Take 1 tablet (81 mg total) by mouth daily.    Skipper Cliche SALINE NASAL DROPS NA Place 2 sprays into both nostrils daily.    . calcitRIOL (ROCALTROL) 0.25 MCG capsule Take 1 capsule by mouth daily.    . Camphor-Eucalyptus-Menthol (VICKS VAPORUB EX) Apply 1 application topically daily.    [name] doxazosin (CARDURA) 2 MG tablet Take 2 mg by mouth 2 (two) times daily.     . fluticasone (FLONASE) 50 MCG/ACT nasal spray one spray by Both Nostrils route daily.    [name] levothyroxine (SYNTHROID, LEVOTHROID) 125 MCG tablet TAKE 1 TABLET BY MOUTH EVERY DAY 90 tablet 1  . loratadine (CLARITIN) 10 MG tablet Take 10 mg by mouth as needed for allergies.     [name] meclizine (ANTIVERT) 25 MG tablet Take 1 tablet (25 mg total) by mouth 3 (three) times daily as needed for dizziness or nausea. 20 tablet 0  . mycophenolate (MYFORTIC) 360 MG TBEC EC tablet Take 360 mg by mouth 2 (two) times daily.    . silver sulfADIAZINE (SILVADENE) 1 % cream Apply 1 application topically daily. Mixes with Triamcinolone cream    . SODIUM  BICARBONATE PO Take 650 mg by mouth 2 (two) times daily. Takes 2 in AM and 2 at bedtime    . sulfamethoxazole-trimethoprim (BACTRIM,SEPTRA) 400-80 MG tablet Take 1 tablet by mouth daily.    . tacrolimus (PROGRAF) 1 MG capsule Take 4 mg by mouth 2 (two) times daily.     [name] triamcinolone (NASACORT ALLERGY 24HR) 55 MCG/ACT AERO nasal inhaler Place 2 sprays into the nose daily.    [name] triamcinolone cream (KENALOG) 0.1 % Apply 1 application topically daily.    . verapamil (CALAN-SR) 240 MG CR tablet Take 240 mg by mouth daily.     [name] ALPRAZolam (XANAX) 0.25 MG tablet Take by mouth.     No facility-administered medications prior to visit.     Allergies  Allergen Reactions  .  Cefaclor Rash  . Naproxen Rash  . Enalapril Maleate Cough    Vasotec  . Metoprolol Tartrate Rash    On legs    ROS As per HPI  PE: Blood pressure 140/90, pulse 82, temperature 98.3 F (36.8 C), temperature source Oral, resp. rate 16, height 5\' 5"  (1.651 m), weight 241 lb 6 oz (109.5 kg), SpO2 95 %. Body mass index is 40.17 kg/m.  Gen: Alert, well appearing.  Patient is oriented to person, place, time, and situation. AFFECT: pleasant, lucid thought and speech. CV: RRR, no m/r/g.   LUNGS: CTA bilat, nonlabored resps, good aeration in all lung fields. EXT: no clubbing or cyanosis.  no edema.    LABS:    Chemistry      Component Value Date/Time   NA 141 06/14/2018 0938   K 4.2 06/14/2018 0938   CL 106 06/14/2018 0938   CO2 28 06/14/2018 0938   BUN 20 06/14/2018 0938   CREATININE 0.98 06/14/2018 0938   CREATININE 3.61 (H) 09/16/2015 1515      Component Value Date/Time   CALCIUM 9.7 06/14/2018 0938   CALCIUM 8.4 08/30/2012 0819   ALKPHOS 71 12/14/2012 0415   AST 24 12/14/2012 0415   ALT 21 12/14/2012 0415   BILITOT 0.3 12/14/2012 0415      IMPRESSION AND PLAN:  HTN: milder readings than initially thought, though. Her cuff is 20 points higher systolic and about 15 points higher diastolic compared to  our manual cuff here today. Start losartan 50mg  qd. Check bmet today and again at f/u visit in 1 wk. Monitor bp at home with NEW bp cuff.  An After Visit Summary was printed and given to the patient.  FOLLOW UP: Return in about 1 week (around 06/21/2018) for f/u HTN and check BMET.  Signed:  Crissie Sickles, MD           06/14/2018

## 2018-06-14 NOTE — Progress Notes (Signed)
AWV reviewed and agree. Signed:  Crissie Sickles, MD           06/14/2018

## 2018-06-21 ENCOUNTER — Encounter: Payer: Self-pay | Admitting: Family Medicine

## 2018-06-21 ENCOUNTER — Encounter: Payer: Self-pay | Admitting: *Deleted

## 2018-06-21 ENCOUNTER — Ambulatory Visit (INDEPENDENT_AMBULATORY_CARE_PROVIDER_SITE_OTHER): Payer: Medicare Other | Admitting: Family Medicine

## 2018-06-21 VITALS — BP 130/80 | HR 79 | Temp 98.2°F | Resp 16 | Ht 65.0 in | Wt 242.0 lb

## 2018-06-21 DIAGNOSIS — I10 Essential (primary) hypertension: Secondary | ICD-10-CM | POA: Diagnosis not present

## 2018-06-21 LAB — BASIC METABOLIC PANEL
BUN: 21 mg/dL (ref 6–23)
CALCIUM: 9.5 mg/dL (ref 8.4–10.5)
CO2: 25 mEq/L (ref 19–32)
Chloride: 108 mEq/L (ref 96–112)
Creatinine, Ser: 0.97 mg/dL (ref 0.40–1.20)
GFR: 59.86 mL/min — ABNORMAL LOW (ref >60.00)
Glucose, Bld: 113 mg/dL — ABNORMAL HIGH (ref 70–99)
POTASSIUM: 4.2 meq/L (ref 3.5–5.1)
SODIUM: 140 meq/L (ref 135–145)

## 2018-06-21 MED ORDER — LOSARTAN POTASSIUM 50 MG PO TABS: 50.0000 mg | ORAL_TABLET | Freq: Every day | ORAL | 6 refills | Status: DC

## 2018-06-21 NOTE — Progress Notes (Signed)
OFFICE VISIT  06/21/2018   CC:  Chief Complaint  Patient presents with  . Follow-up    HTN    HPI:    Patient is a 72 y.o. Caucasian female who presents for 1 week f/u HTN. Started losartan 50 mg qd last visit.  She was already on extended release verapamil for her hx of SVT. Also recommended pt replace her home bp cuff b/c it read quite a bit higher than our manual checks here.  Interim hx: She has been using a DIFFERENT old bp machine she had at home, is still going to get a brand new one though-->120s-140/70-80s.  Lowest 124/60.  Reading with her cuff here today was 143/92, our cuff 130/80. No dizziness, no rash, no cough. Feeling well.  Past Medical History:  Diagnosis Date  . Anemia of chronic kidney failure    r/t kidney function- receives Procrit  . Anxiety   . Cancer [facility])    kidney cancer, left nephrectomy  . Chronic renal insufficiency, stage III (moderate) (HCC) 10/2015   Post-transplant Cr 1.3, GFR 41 as of June 2017 f/u w/ [facility]  . CMV infection [facility]) summer 2017   Valcyte per ID/Renal transplant team  . Diverticulosis    a. 05/2012 colonoscopy  . Erosive lichen planus of vulva    Topical steroids (managed by Dr. Mila Palmer [facility].  . ESRD (end stage renal disease) ([place])    began dialysis 2014--followed by Dr. [name].  Received deceased donor kidney transplant 10/2015.  [name] FSGS (focal segmental glomerulosclerosis)    right kidney; hx of left renal cell cancer and got nephrectomy 1993.  [name] GERD (gastroesophageal reflux disease)   . Gout   . Heart murmur    (Diastolic) ECHO 02/8241 showed that this murmur is coming from pt's R arm AV fistula  . 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  . Hiatal hernia   . History of renal cell cancer 1993   Left nephrectomy  .  Hyperlipidemia   . Hypertension   . Hypothyroidism   . Impingement syndrome of left shoulder 04/2016   Dr. Christy Sartorius to PT  . Metatarsal fracture, pathologic 01/2018   Right; orthocarolina-->post op shoe continued, f/u x-ray planned.  . Osteoarthritis of left knee 08/2017   severe, diffuse, tricompartmental.  Responded to steroid injection.    . Osteoporosis 06/07/2016   DEXA T-score of -3.1.  Fosamax planned 2017 but dental work prevented start..  Pathologic toe fx 12/2017--Repeat DEXA pending.  . Renal transplant recipient November 20, 2015   Baseline Cr as of 02/2018= 1.0-1.2.  . SVT (supraventricular tachycardia) (HCC)     Past Surgical History:  Procedure Laterality Date  . AV FISTULA PLACEMENT Right    Right arm: aneurismal dilatation 2017 being followed by CV surgeons  . CARDIOVASCULAR STRESS TEST     03/10/15 ETT ([facility]): Exercise ECG negative at 83% max predicted HR.   [name] COLONOSCOPY  2003; 05/2012   diverticulosis, no polyps.  Recall 10 yrs  . colonoscopy with polypectomy     Dr Henrene Pastor  . DEXA  06/07/2016   T-score -3.1  . KIDNEY TRANSPLANT  Nov 20, 2015   Deceased donor kidney transplant, with Thymoglobulin induction  . LIGATION OF ARTERIOVENOUS  FISTULA Right 02/14/2017   Procedure: LIGATION OF ARTERIOVENOUS  FISTULA;  Surgeon: Angelia Mould, MD;  Location: [place];  Service: Vascular;  Laterality: Right;  . NEPHRECTOMY  1993   for malignancy- left  . RENAL BIOPSY     right  . RESECTION OF ARTERIOVENOUS FISTULA ANEURYSM Right 02/14/2017   Procedure: EXCISION OF RIGHT BRACHIOCEPHALIC ARTERIOVENOUS FISTULA ANEURYSM;  Surgeon: Angelia Mould, MD;  Location: [place];  Service: Vascular;  Laterality: Right;  . TEE WITHOUT CARDIOVERSION N/A 08/27/2013   Procedure: TRANSESOPHAGEAL ECHOCARDIOGRAM (TEE);  Surgeon: [name], MD;  Location: [facility] ENDOSCOPY;  Service: Cardiovascular;  Laterality: N/A;  . TOTAL ABDOMINAL HYSTERECTOMY W/ BILATERAL SALPINGOOPHORECTOMY   1997   fibroids  . TRANSTHORACIC ECHOCARDIOGRAM     03/10/15 echo ([facility]): LV cavity normal in size, focal basal hypertrophy, normal systolic function, EF 74% (visual est). Normal wall motion, no regional wall motion abnormalities. Mild diastolic dysfunction with normal LA chamber size. No significant valve stenosis or regurgitation.  . VULVA / PERINEUM BIOPSY  2015    Outpatient Medications Prior to Visit  Medication Sig Dispense Refill  . acetaminophen (TYLENOL) 500 MG tablet Take 500-750 mg by mouth every 6 (six) hours as needed for pain.    [name] aspirin EC 81 MG EC tablet Take 1 tablet (81 mg total) by mouth daily.    Skipper Cliche SALINE NASAL DROPS NA Place 2 sprays into both nostrils daily.    . calcitRIOL (ROCALTROL) 0.25 MCG capsule Take 1 capsule by mouth daily.    . Camphor-Eucalyptus-Menthol (VICKS VAPORUB EX) Apply 1 application topically daily.    [name] doxazosin (CARDURA) 2 MG tablet Take 2 mg by mouth 2 (two) times daily.     . fluticasone (FLONASE) 50 MCG/ACT nasal spray one spray by Both Nostrils route daily.    [name] levothyroxine (SYNTHROID, LEVOTHROID) 125 MCG tablet TAKE 1 TABLET BY MOUTH EVERY DAY 90 tablet 1  . loratadine (CLARITIN) 10 MG tablet Take 10 mg by mouth as needed for allergies.     [name] meclizine (ANTIVERT) 25 MG tablet Take 1 tablet (25 mg total) by mouth 3 (three) times daily as needed for dizziness or nausea. 20 tablet 0  . mycophenolate (MYFORTIC) 360 MG TBEC EC tablet Take 360 mg by mouth 2 (two) times daily.    . silver sulfADIAZINE (SILVADENE) 1 % cream Apply 1 application topically daily. Mixes with Triamcinolone cream    . SODIUM BICARBONATE PO Take 650 mg by mouth 2 (two) times daily. Takes 2 in AM and 2 at bedtime    . sulfamethoxazole-trimethoprim (BACTRIM,SEPTRA) 400-80 MG tablet Take 1 tablet by mouth daily.    . tacrolimus (PROGRAF) 1 MG capsule Take 4 mg by mouth 2 (two) times daily.     [name] triamcinolone (NASACORT ALLERGY 24HR) 55 MCG/ACT  AERO nasal inhaler Place 2 sprays into the nose daily.    [name] triamcinolone cream (KENALOG) 0.1 % Apply 1 application topically daily.    . verapamil (CALAN-SR) 240 MG CR tablet Take 240 mg by mouth daily.     [name] losartan (COZAAR) 50 MG tablet Take 1 tablet (50 mg total) by mouth daily. 30 tablet 0   No facility-administered medications prior to visit.     Allergies  Allergen Reactions  . Cefaclor Rash  . Naproxen Rash  . Enalapril Maleate Cough    Vasotec  . Metoprolol Tartrate Rash    On legs    ROS As per HPI  PE: Blood pressure 130/80, pulse 79, temperature 98.2 F (36.8 C), temperature source Oral, resp. rate 16, height 5\' 5"  (  1.651 m), weight 242 lb (109.8 kg), SpO2 95 %. Body mass index is 40.27 kg/m.  Gen: Alert, well appearing.  Patient is oriented to person, place, time, and situation. AFFECT: pleasant, lucid thought and speech. No further exam today.  LABS:    Chemistry      Component Value Date/Time   NA 141 06/14/2018 0938   K 4.2 06/14/2018 0938   CL 106 06/14/2018 0938   CO2 28 06/14/2018 0938   BUN 20 06/14/2018 0938   CREATININE 0.98 06/14/2018 0938   CREATININE 3.61 (H) 09/16/2015 1515      Component Value Date/Time   CALCIUM 9.7 06/14/2018 0938   CALCIUM 8.4 08/30/2012 0819   ALKPHOS 71 12/14/2012 0415   AST 24 12/14/2012 0415   ALT 21 12/14/2012 0415   BILITOT 0.3 12/14/2012 0415      IMPRESSION AND PLAN:  HTN: now good control. Continue losartan 50mg . BMET today. Return if new bp machine is reading in a way that makes her think it is inaccurate and we'll check it against our manual cuff reading. An After Visit Summary was printed and given to the patient.  FOLLOW UP: Return in about 4 months (around 10/20/2018) for annual CPE (fasting).  Signed:  Crissie Sickles, MD           06/21/2018

## 2018-07-27 DIAGNOSIS — L438 Other lichen planus: Secondary | ICD-10-CM | POA: Diagnosis not present

## 2018-07-27 DIAGNOSIS — L304 Erythema intertrigo: Secondary | ICD-10-CM | POA: Diagnosis not present

## 2018-08-20 DIAGNOSIS — Z940 Kidney transplant status: Secondary | ICD-10-CM | POA: Diagnosis not present

## 2018-08-20 DIAGNOSIS — I129 Hypertensive chronic kidney disease with stage 1 through stage 4 chronic kidney disease, or unspecified chronic kidney disease: Secondary | ICD-10-CM | POA: Diagnosis not present

## 2018-08-22 ENCOUNTER — Other Ambulatory Visit: Payer: Self-pay | Admitting: Family Medicine

## 2018-08-22 ENCOUNTER — Ambulatory Visit (INDEPENDENT_AMBULATORY_CARE_PROVIDER_SITE_OTHER): Payer: Medicare Other

## 2018-08-22 ENCOUNTER — Encounter: Payer: Self-pay | Admitting: Family Medicine

## 2018-08-22 DIAGNOSIS — M810 Age-related osteoporosis without current pathological fracture: Secondary | ICD-10-CM | POA: Diagnosis not present

## 2018-08-22 DIAGNOSIS — E2839 Other primary ovarian failure: Secondary | ICD-10-CM

## 2018-08-22 DIAGNOSIS — Z1239 Encounter for other screening for malignant neoplasm of breast: Secondary | ICD-10-CM | POA: Diagnosis not present

## 2018-08-22 DIAGNOSIS — M85852 Other specified disorders of bone density and structure, left thigh: Secondary | ICD-10-CM | POA: Diagnosis not present

## 2018-08-22 DIAGNOSIS — M85851 Other specified disorders of bone density and structure, right thigh: Secondary | ICD-10-CM | POA: Diagnosis not present

## 2018-08-22 DIAGNOSIS — Z780 Asymptomatic menopausal state: Secondary | ICD-10-CM | POA: Diagnosis not present

## 2018-08-22 DIAGNOSIS — Z1231 Encounter for screening mammogram for malignant neoplasm of breast: Secondary | ICD-10-CM | POA: Diagnosis not present

## 2018-08-22 IMAGING — MG DIGITAL SCREENING BILATERAL MAMMOGRAM WITH TOMO AND CAD
8 of 16 series · 8 of 40 positions shown · non-contrast
Comparison: Previous exam(s).

CLINICAL DATA: Screening.

EXAM:
DIGITAL SCREENING BILATERAL MAMMOGRAM WITH TOMO AND CAD

[R CV synth-2D]
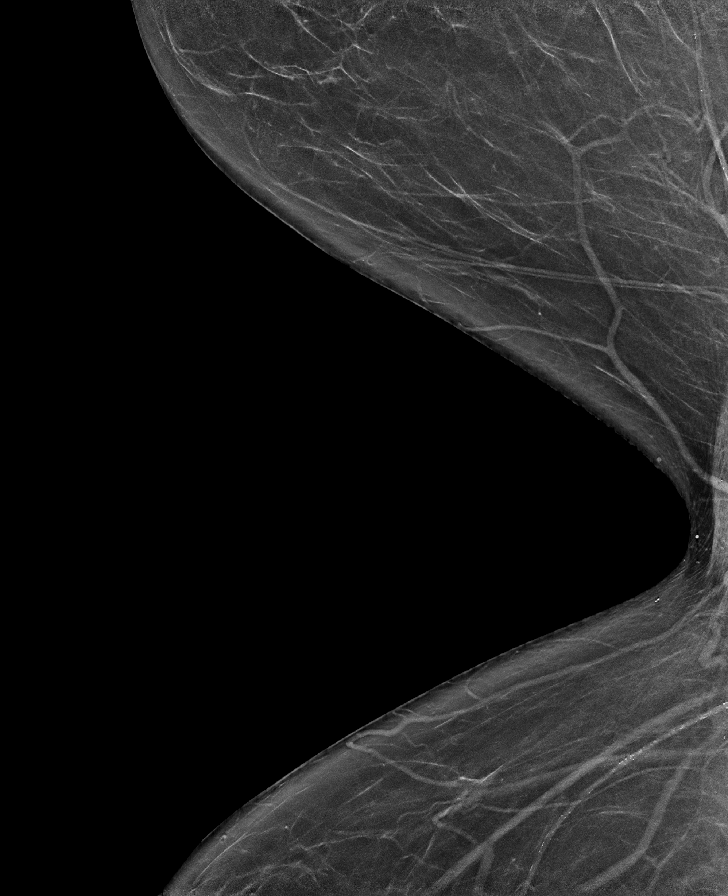

[L MLO synth-2D (1 of 2)]
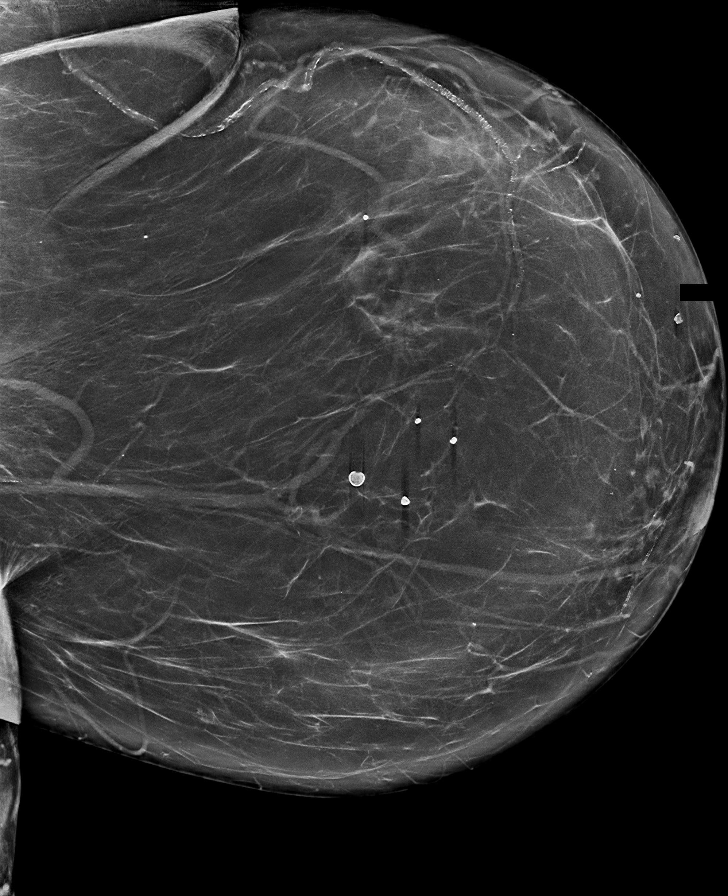

[L CC synth-2D (1 of 2)]
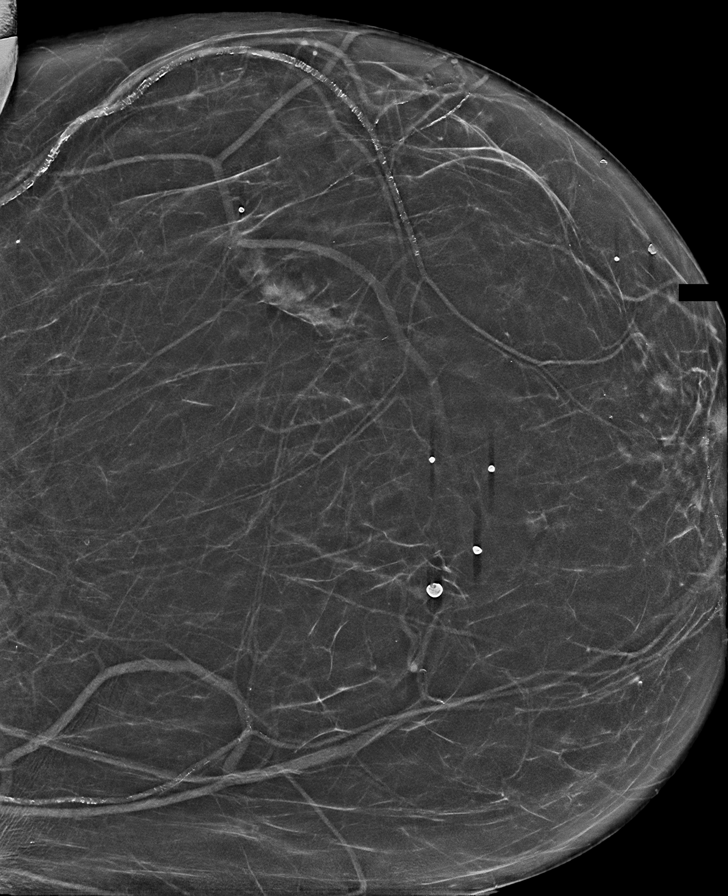

[R CC synth-2D]
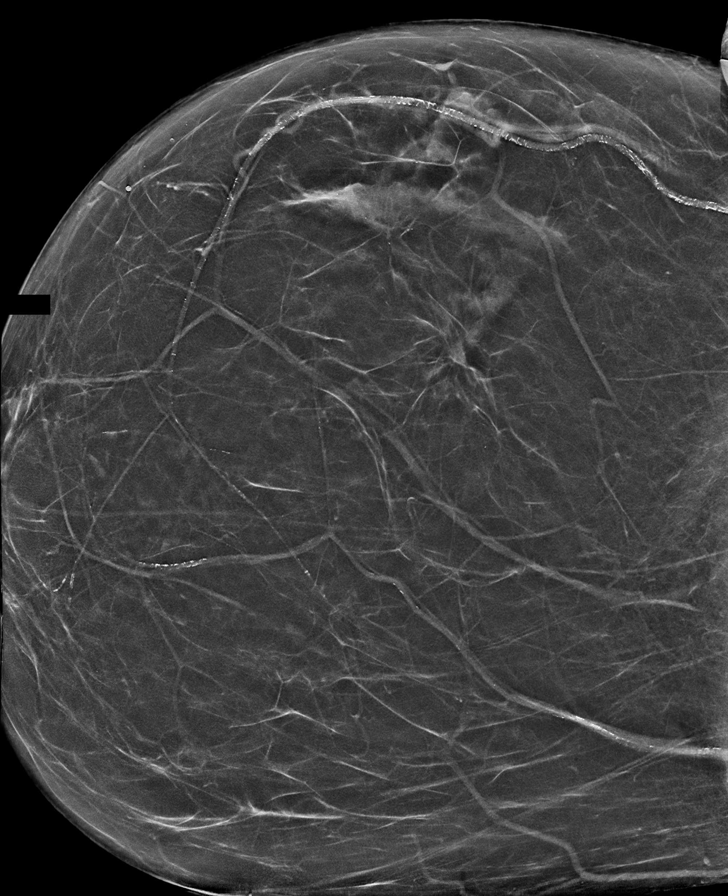

[R MLO synth-2D]
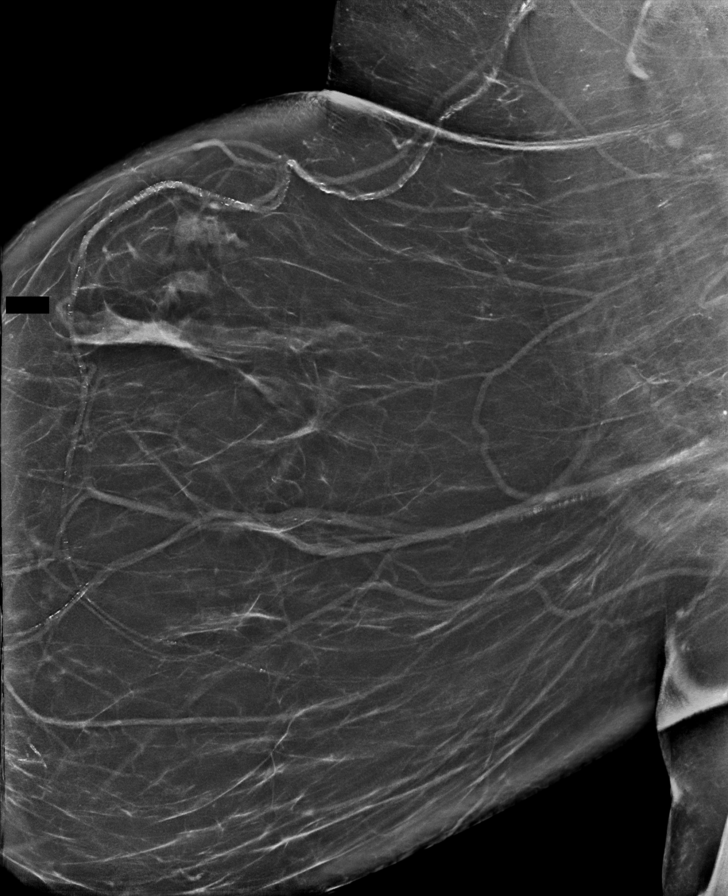

[L CC synth-2D (2 of 2)]
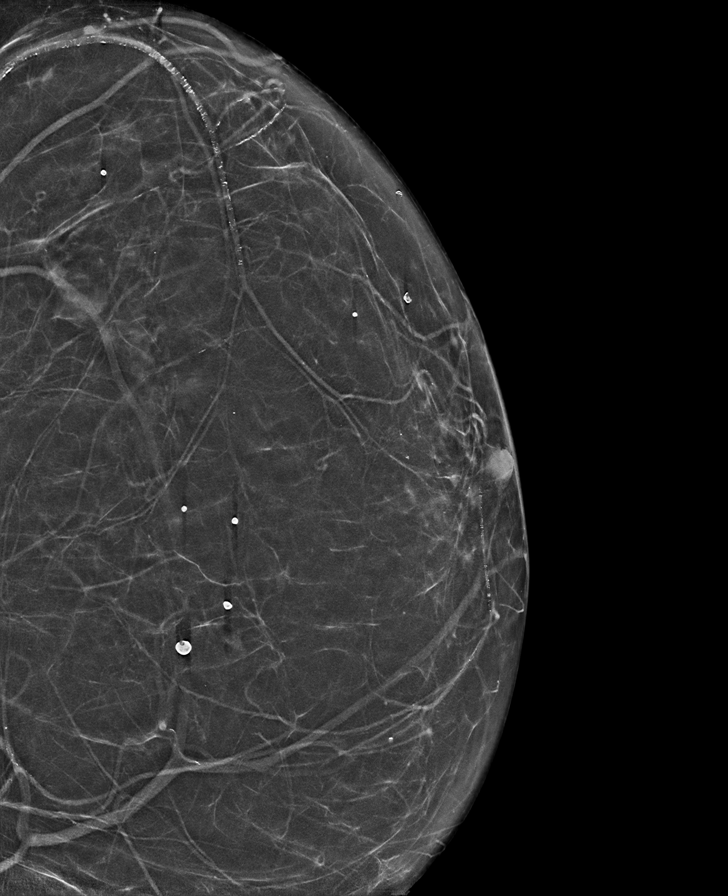

[L MLO synth-2D (2 of 2)]
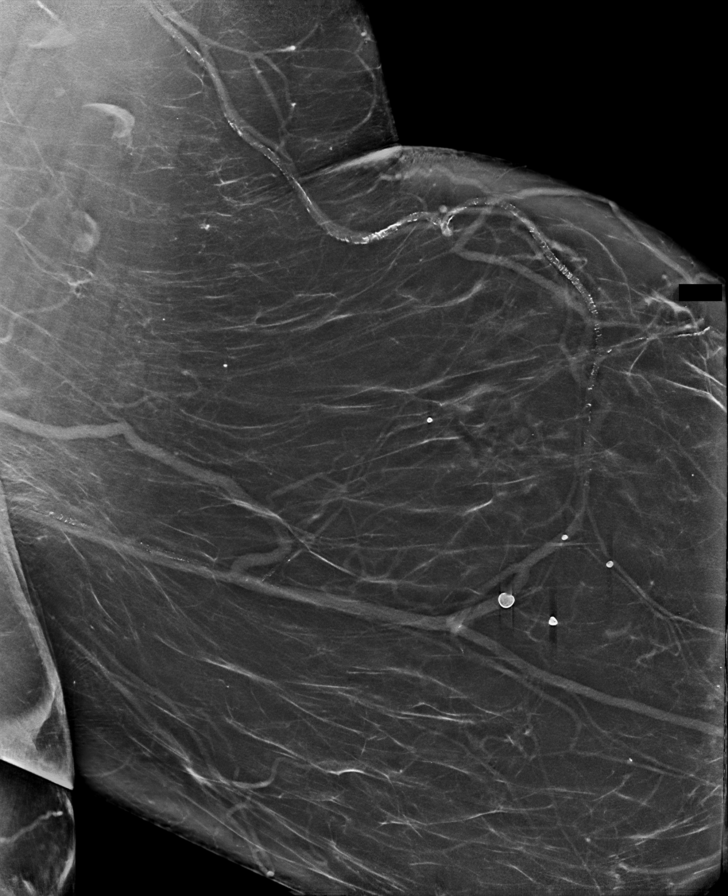

[L MLO tomo · tomo slice 45/89.0]
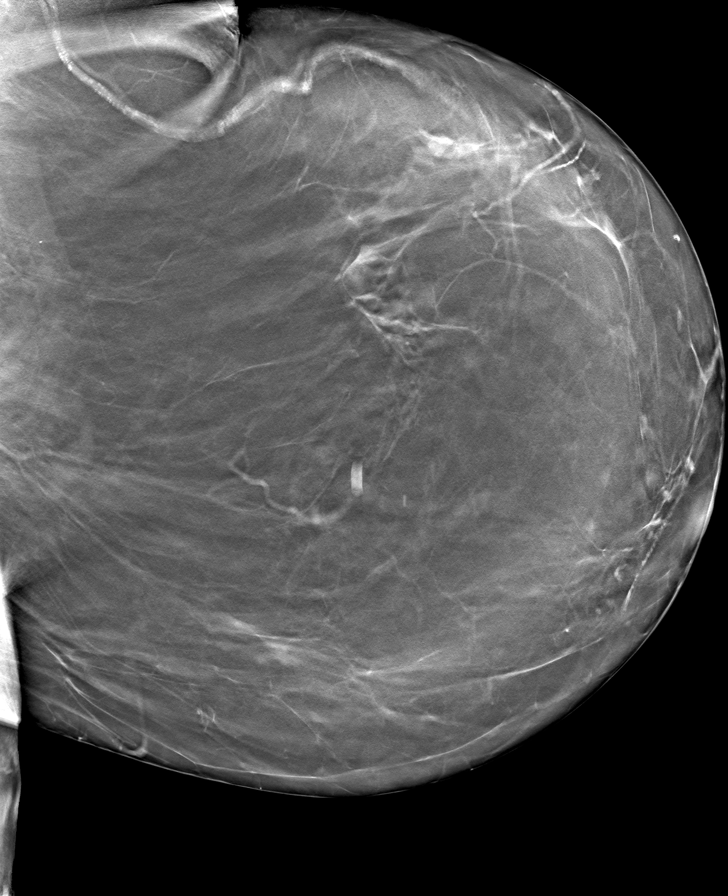

[8 of 40 positions shown; findings below may reference images not displayed]

ACR Breast Density Category b: There are scattered areas of
fibroglandular density.
FINDINGS: There are no findings suspicious for malignancy. Images were
processed with CAD.
IMPRESSION: No mammographic evidence of malignancy. A result letter of this
screening mammogram will be mailed directly to the patient.

RECOMMENDATION:
Screening mammogram in one year. (Code:[TQ])

BI-RADS CATEGORY  1: Negative.

## 2018-08-22 NOTE — Telephone Encounter (Signed)
This encounter was created in error - please disregard.

## 2018-08-30 DIAGNOSIS — Z940 Kidney transplant status: Secondary | ICD-10-CM | POA: Diagnosis not present

## 2018-08-30 DIAGNOSIS — N051 Unspecified nephritic syndrome with focal and segmental glomerular lesions: Secondary | ICD-10-CM | POA: Diagnosis not present

## 2018-08-30 DIAGNOSIS — I10 Essential (primary) hypertension: Secondary | ICD-10-CM | POA: Diagnosis not present

## 2018-08-30 DIAGNOSIS — N2581 Secondary hyperparathyroidism of renal origin: Secondary | ICD-10-CM | POA: Diagnosis not present

## 2018-08-30 DIAGNOSIS — B259 Cytomegaloviral disease, unspecified: Secondary | ICD-10-CM | POA: Diagnosis not present

## 2018-09-03 ENCOUNTER — Encounter: Payer: Self-pay | Admitting: Family Medicine

## 2018-09-03 ENCOUNTER — Ambulatory Visit (INDEPENDENT_AMBULATORY_CARE_PROVIDER_SITE_OTHER): Payer: Medicare Other | Admitting: Family Medicine

## 2018-09-03 VITALS — BP 144/80 | HR 75 | Temp 98.1°F | Resp 16 | Ht 65.0 in | Wt 243.4 lb

## 2018-09-03 DIAGNOSIS — B9789 Other viral agents as the cause of diseases classified elsewhere: Secondary | ICD-10-CM | POA: Diagnosis not present

## 2018-09-03 DIAGNOSIS — J069 Acute upper respiratory infection, unspecified: Secondary | ICD-10-CM

## 2018-09-03 NOTE — Progress Notes (Signed)
OFFICE VISIT  09/03/2018   CC:  Chief Complaint  Patient presents with  . Sinusitis    ?   HPI:    Patient is a 73 y.o. Caucasian female with history of kidney transplant and currently takes the antirejection meds mycophenolate and tacrolimus.  She presents today for respiratory complaints. Onset about a week ago, nasal congestion with PND, ears feeling like the fill up/pop on and off, a bit of lightheadedness. Saw some blood tinged mucous when she blew her nose this morning.  +Sneezing.  No signif HA or ST. She burns wood in home for heat--it is a dry air. She has had some cough. No fever, no wheezing, no SOB.  No CP.     Past Medical History:  Diagnosis Date  . Anemia of chronic kidney failure    r/t kidney function- receives Procrit  . Anxiety   . Cancer [facility])    kidney cancer, left nephrectomy  . Chronic renal insufficiency, stage III (moderate) (HCC) 10/2015   Post-transplant Cr 1.3, GFR 41 as of June 2017 f/u w/ [facility]  . CMV infection [facility]) summer 2017   Valcyte per ID/Renal transplant team  . Diverticulosis    a. 05/2012 colonoscopy  . Erosive lichen planus of vulva    Topical steroids (managed by Dr. Mila [facility]  . ESRD (end stage renal disease) ([place])    began dialysis 2014--followed by Dr. [name].  Received deceased donor kidney transplant 10/2015.  [name] FSGS (focal segmental glomerulosclerosis)    right kidney; hx of left renal cell cancer and got nephrectomy 1993.  [name] GERD (gastroesophageal reflux disease)   . Gout   . Heart murmur    (Diastolic) ECHO 02/6545 showed that this murmur is coming from pt's R arm AV fistula  . 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  . Hiatal hernia   . History of renal cell cancer 1993   Left nephrectomy  . Hyperlipidemia   .  Hypertension   . Hypothyroidism   . Impingement syndrome of left shoulder 04/2016   Dr. Christy Sartorius to PT  . Metatarsal fracture, pathologic 01/2018   Right; orthocarolina-->post op shoe continued, f/u x-ray planned.  . Osteoarthritis of left knee 08/2017   severe, diffuse, tricompartmental.  Responded to steroid injection.    . Osteoporosis 06/07/2016   DEXA T-score of -3.1.  Fosamax planned 2017 but dental work prevented start..  Pathologic toe fx 12/2017--repeat DEXA 08/2018, T-score -3.3 radius.  . Renal transplant recipient Dec 06, 2015   Baseline Cr as of 02/2018= 1.0-1.2.  . SVT (supraventricular tachycardia) (HCC)     Past Surgical History:  Procedure Laterality Date  . AV FISTULA PLACEMENT Right    Right arm: aneurismal dilatation 2017 being followed by CV surgeons  . CARDIOVASCULAR STRESS TEST     03/10/15 ETT ([facility]): Exercise ECG negative at 83% max predicted HR.   [name] COLONOSCOPY  2003; 05/2012   diverticulosis, no polyps.  Recall 10 yrs  . colonoscopy with polypectomy     Dr Henrene Pastor  . DEXA  06/07/2016; 08/22/2018   2017 T-score -3.1.  2020 T score -3.3  . KIDNEY TRANSPLANT  Dec 06, 2015   Deceased donor kidney transplant, with Thymoglobulin induction  . LIGATION OF ARTERIOVENOUS  FISTULA Right 02/14/2017   Procedure: LIGATION OF ARTERIOVENOUS  FISTULA;  Surgeon: Angelia Mould, MD;  Location: [facility];  Service: Vascular;  Laterality: Right;  . NEPHRECTOMY  1993   for malignancy- left  . RENAL BIOPSY     right  . RESECTION OF ARTERIOVENOUS FISTULA ANEURYSM Right 02/14/2017   Procedure: EXCISION OF RIGHT BRACHIOCEPHALIC ARTERIOVENOUS FISTULA ANEURYSM;  Surgeon: Angelia Mould, MD;  Location: [place];  Service: Vascular;  Laterality: Right;  . TEE WITHOUT CARDIOVERSION N/A 08/27/2013   Procedure: TRANSESOPHAGEAL ECHOCARDIOGRAM (TEE);  Surgeon: [name], MD;  Location: [facility] ENDOSCOPY;  Service: Cardiovascular;  Laterality: N/A;  . TOTAL ABDOMINAL  HYSTERECTOMY W/ BILATERAL SALPINGOOPHORECTOMY  1997   fibroids  . TRANSTHORACIC ECHOCARDIOGRAM     03/10/15 echo ([facility]): LV cavity normal in size, focal basal hypertrophy, normal systolic function, EF 21% (visual est). Normal wall motion, no regional wall motion abnormalities. Mild diastolic dysfunction with normal LA chamber size. No significant valve stenosis or regurgitation.  . VULVA / PERINEUM BIOPSY  2015    Outpatient Medications Prior to Visit  Medication Sig Dispense Refill  . acetaminophen (TYLENOL) 500 MG tablet Take 500-750 mg by mouth every 6 (six) hours as needed for pain.    [name] aspirin EC 81 MG EC tablet Take 1 tablet (81 mg total) by mouth daily.    Skipper Cliche SALINE NASAL DROPS NA Place 2 sprays into both nostrils daily.    . Camphor-Eucalyptus-Menthol (VICKS VAPORUB EX) Apply 1 application topically daily.    [name] doxazosin (CARDURA) 2 MG tablet Take 2 mg by mouth 2 (two) times daily.     . fluticasone (FLONASE) 50 MCG/ACT nasal spray one spray by Both Nostrils route daily.    [name] levothyroxine (SYNTHROID, LEVOTHROID) 125 MCG tablet TAKE 1 TABLET BY MOUTH EVERY DAY 90 tablet 1  . losartan (COZAAR) 50 MG tablet Take 1 tablet (50 mg total) by mouth daily. 30 tablet 6  . meclizine (ANTIVERT) 25 MG tablet Take 1 tablet (25 mg total) by mouth 3 (three) times daily as needed for dizziness or nausea. 20 tablet 0  . mycophenolate (MYFORTIC) 360 MG TBEC EC tablet Take 360 mg by mouth 2 (two) times daily.    . silver sulfADIAZINE (SILVADENE) 1 % cream Apply 1 application topically daily. Mixes with Triamcinolone cream    . SODIUM BICARBONATE PO Take 650 mg by mouth 2 (two) times daily. Takes 2 in AM and 2 at bedtime    . sulfamethoxazole-trimethoprim (BACTRIM,SEPTRA) 400-80 MG tablet Take 1 tablet by mouth daily.    . tacrolimus (PROGRAF) 1 MG capsule Take 4 mg by mouth 2 (two) times daily.     [name] triamcinolone (NASACORT ALLERGY 24HR) 55 MCG/ACT AERO nasal inhaler Place 2  sprays into the nose daily.    [name] triamcinolone cream (KENALOG) 0.1 % Apply 1 application topically daily.    . verapamil (CALAN-SR) 240 MG CR tablet Take 240 mg by mouth daily.     . calcitRIOL (ROCALTROL) 0.25 MCG capsule Take 1 capsule by mouth daily.    [name] loratadine (CLARITIN) 10 MG tablet Take 10 mg by mouth as needed for allergies.      No facility-administered medications prior to visit.     Allergies  Allergen Reactions  . Cefaclor Rash  . Naproxen Rash  . Enalapril Maleate Cough    Vasotec  . Metoprolol Tartrate Rash    On legs    ROS As per HPI  PE: Blood pressure (!) 144/80, pulse 75, temperature 98.1 F (36.7 C), temperature source Oral, resp.  rate 16, height 5\' 5"  (1.651 m), weight 243 lb 6 oz (110.4 kg), SpO2 97 %. VS: noted--normal. Gen: alert, NAD, NONTOXIC APPEARING. HEENT: eyes without injection, drainage, or swelling.  Ears: EACs clear, TMs with normal light reflex and landmarks.  Nose: Clear rhinorrhea, with some dried, crusty exudate adherent to mildly injected mucosa.  No purulent d/c.  No paranasal sinus TTP.  No facial swelling.  Throat and mouth without focal lesion.  No pharyngial swelling, erythema, or exudate.   Neck: supple, no LAD.   LUNGS: CTA bilat, nonlabored resps.   CV: RRR, no m/r/g. EXT: no c/c/e SKIN: no rash    LABS:    Chemistry      Component Value Date/Time   NA 140 06/21/2018 0902   K 4.2 06/21/2018 0902   CL 108 06/21/2018 0902   CO2 25 06/21/2018 0902   BUN 21 06/21/2018 0902   CREATININE 0.97 06/21/2018 0902   CREATININE 3.61 (H) 09/16/2015 1515      Component Value Date/Time   CALCIUM 9.5 06/21/2018 0902   CALCIUM 8.4 08/30/2012 0819   ALKPHOS 71 12/14/2012 0415   AST 24 12/14/2012 0415   ALT 21 12/14/2012 0415   BILITOT 0.3 12/14/2012 0415      IMPRESSION AND PLAN:  Viral URI with cough and some eustachian tube dysfunction. Reassured. Reiterated that it will likely take her a lot longer to clear a viral URI  than people who are NOT on immunosuppressant meds. Signs/symptoms to call or return for were reviewed and pt expressed understanding.  An After Visit Summary was printed and given to the patient.  FOLLOW UP: Return if symptoms worsen or fail to improve.  Signed:  Crissie Sickles, MD           09/03/2018

## 2018-09-05 ENCOUNTER — Telehealth: Payer: Self-pay | Admitting: Family Medicine

## 2018-09-05 DIAGNOSIS — E039 Hypothyroidism, unspecified: Secondary | ICD-10-CM

## 2018-09-06 ENCOUNTER — Other Ambulatory Visit: Payer: Self-pay | Admitting: Family Medicine

## 2018-09-06 NOTE — Telephone Encounter (Signed)
Pt has not had labs for her Thyroid since 08/16/17. A refill request came through and it was approved for #30 with 0 refills.  I sent her a message telling her to schedule a visit. She is asking if she can just come for labs and insists since she was just here on 09/03/18 (for URI) she can just come got labs.  Please advise

## 2018-09-06 NOTE — Telephone Encounter (Signed)
Refilled medication for Levothyroxine but pts last OV and Labs for Thyroid DX was 08/16/17. My Chart message sent to patient letting her know to make an appt to get further refills.

## 2018-09-06 NOTE — Telephone Encounter (Signed)
Yes, it is fine if she just comes in for a TSH.  Pls order future TSH, dx is hypothyroidism unspecified.-thx

## 2018-09-10 ENCOUNTER — Other Ambulatory Visit (INDEPENDENT_AMBULATORY_CARE_PROVIDER_SITE_OTHER): Payer: Medicare Other

## 2018-09-10 DIAGNOSIS — E039 Hypothyroidism, unspecified: Secondary | ICD-10-CM | POA: Diagnosis not present

## 2018-09-10 LAB — TSH: TSH: 1.09 u[IU]/mL (ref 0.35–4.50)

## 2018-09-11 ENCOUNTER — Other Ambulatory Visit: Payer: Self-pay | Admitting: Family Medicine

## 2018-09-11 MED ORDER — LEVOTHYROXINE SODIUM 125 MCG PO TABS: 125.0000 ug | ORAL_TABLET | Freq: Every day | ORAL | 1 refills | Status: DC

## 2018-09-18 ENCOUNTER — Encounter: Payer: Self-pay | Admitting: Family Medicine

## 2018-10-29 ENCOUNTER — Ambulatory Visit (INDEPENDENT_AMBULATORY_CARE_PROVIDER_SITE_OTHER): Payer: Medicare Other | Admitting: Family Medicine

## 2018-10-29 ENCOUNTER — Encounter: Payer: Self-pay | Admitting: Family Medicine

## 2018-10-29 ENCOUNTER — Other Ambulatory Visit: Payer: Self-pay

## 2018-10-29 VITALS — BP 136/84 | HR 100 | Temp 98.2°F | Resp 16 | Ht 65.0 in | Wt 246.2 lb

## 2018-10-29 DIAGNOSIS — H9311 Tinnitus, right ear: Secondary | ICD-10-CM

## 2018-10-29 DIAGNOSIS — I10 Essential (primary) hypertension: Secondary | ICD-10-CM

## 2018-10-29 MED ORDER — AMLODIPINE BESYLATE 10 MG PO TABS: 10.0000 mg | ORAL_TABLET | Freq: Every day | ORAL | 0 refills | Status: DC

## 2018-10-29 NOTE — Patient Instructions (Signed)
Take 1000 Unit cap/tab of vit D every day.

## 2018-10-29 NOTE — Progress Notes (Signed)
OFFICE VISIT  10/29/2018   CC:  Chief Complaint  Patient presents with  . fluid in right ear    HPI:    Patient is a 73 y.o. Caucasian female who presents for "whooshing" sensation in R ear. Noted onset a few nights ago while watching TV.  No hearing loss noted. Feels like the whoosh is not as loud in daytime b/c of more ambient noise. Ear tickles a bit.  Has chronic nasal congestion.  Very mild cough from PND. No fevers.  Home bp's : up and down 170-180 over low 100s at times, 140s/90s. Unclear how long these have been up like this but sounds like more than just a week or two.  No acute pain, but she is definitely an anxious/nervous woman.  Past Medical History:  Diagnosis Date  . Anemia of chronic kidney failure    r/t kidney function- receives Procrit  . Anxiety   . Chronic renal insufficiency, stage III (moderate) (HCC) 10/2015   Post-transplant Cr 1.3, GFR 41 as of June 2017 f/u w/ [facility]  . CMV infection [facility]) summer 2017   Valcyte per ID/Renal transplant team  . Diverticulosis    a. 05/2012 colonoscopy  . Erosive lichen planus of vulva    Topical steroids (managed by Dr. Mila Palmer [facility].  . ESRD (end stage renal disease) ([place])    began dialysis 2014--followed by Dr. [name].  Received deceased donor kidney transplant 10/2015.  [name] FSGS (focal segmental glomerulosclerosis)    right kidney; hx of left renal cell cancer and got nephrectomy 1993.  [name] GERD (gastroesophageal reflux disease)   . Gout   . Heart murmur    (Diastolic) ECHO 01/2835 showed that this murmur is coming from pt's R arm AV fistula  . Hiatal hernia   . History of renal cell cancer 1993   Left nephrectomy  . Hyperlipidemia   . Hypertension   . Hypothyroidism   . Impingement syndrome of left shoulder 04/2016   Dr. Christy Sartorius to PT  . Metatarsal fracture, pathologic 01/2018   Right; orthocarolina-->post op shoe continued, f/u  x-ray planned.  . Osteoarthritis of left knee 08/2017   severe, diffuse, tricompartmental.  Responded to steroid injection.    . Osteoporosis 06/07/2016   DEXA T-score of -3.1.  Fosamax planned 2017 but dental work prevented start..  Pathologic toe fx 12/2017--repeat DEXA 08/2018, T-score -3.3 radius.  . Renal transplant recipient 11/30/15   Baseline Cr as of 02/2018= 1.0-1.2.  . SVT (supraventricular tachycardia) (HCC)     Past Surgical History:  Procedure Laterality Date  . AV FISTULA PLACEMENT Right    Right arm: aneurismal dilatation 2017 being followed by CV surgeons  . CARDIOVASCULAR STRESS TEST     03/10/15 ETT ([facility]): Exercise ECG negative at 83% max predicted HR.   [name] COLONOSCOPY  2003; 05/2012   diverticulosis, no polyps.  Recall 10 yrs  . colonoscopy with polypectomy     Dr Henrene Pastor  . DEXA  06/07/2016; 08/22/2018   2017 T-score -3.1.  2020 T score -3.3  . KIDNEY TRANSPLANT  11/30/15   Deceased donor kidney transplant, with Thymoglobulin induction  . LIGATION OF ARTERIOVENOUS  FISTULA Right 02/14/2017   Procedure: LIGATION OF ARTERIOVENOUS  FISTULA;  Surgeon: Angelia Mould, MD;  Location: [place];  Service: Vascular;  Laterality: Right;  . NEPHRECTOMY  1993   for malignancy- left  . RENAL BIOPSY     right  .  RESECTION OF ARTERIOVENOUS FISTULA ANEURYSM Right 02/14/2017   Procedure: EXCISION OF RIGHT BRACHIOCEPHALIC ARTERIOVENOUS FISTULA ANEURYSM;  Surgeon: Angelia Mould, MD;  Location: [place];  Service: Vascular;  Laterality: Right;  . TEE WITHOUT CARDIOVERSION N/A 08/27/2013   Procedure: TRANSESOPHAGEAL ECHOCARDIOGRAM (TEE);  Surgeon: [name], MD;  Location: [facility] END[facility]COPY;  Service: Cardiovascular;  Laterality: N/A;  . TOTAL ABDOMINAL HYSTERECTOMY W/ BILATERAL SALPINGOOPHORECTOMY  1997   fibroids  . TRANSTHORACIC ECHOCARDIOGRAM     03/10/15 echo ([facility]): LV cavity normal in size, focal basal hypertrophy, normal systolic function,  EF 83% (visual est). Normal wall motion, no regional wall motion abnormalities. Mild diastolic dysfunction with normal LA chamber size. No significant valve stenosis or regurgitation.  . VULVA / PERINEUM BIOPSY  2015    Outpatient Medications Prior to Visit  Medication Sig Dispense Refill  . acetaminophen (TYLENOL) 500 MG tablet Take 500-750 mg by mouth every 6 (six) hours as needed for pain.    [name] aspirin EC 81 MG EC tablet Take 1 tablet (81 mg total) by mouth daily.    Skipper Cliche SALINE NASAL DROPS NA Place 2 sprays into both nostrils daily.    . Camphor-Eucalyptus-Menthol (VICKS VAPORUB EX) Apply 1 application topically daily.    [name] doxazosin (CARDURA) 2 MG tablet Take 2 mg by mouth 2 (two) times daily.     . fluticasone (FLONASE) 50 MCG/ACT nasal spray one spray by Both Nostrils route daily.    [name] levothyroxine (SYNTHROID, LEVOTHROID) 125 MCG tablet Take 1 tablet (125 mcg total) by mouth daily. 90 tablet 1  . losartan (COZAAR) 50 MG tablet Take 1 tablet (50 mg total) by mouth daily. 30 tablet 6  . meclizine (ANTIVERT) 25 MG tablet Take 1 tablet (25 mg total) by mouth 3 (three) times daily as needed for dizziness or nausea. 20 tablet 0  . mycophenolate (MYFORTIC) 360 MG TBEC EC tablet Take 360 mg by mouth 2 (two) times daily.    . silver sulfADIAZINE (SILVADENE) 1 % cream Apply 1 application topically daily. Mixes with Triamcinolone cream    . SODIUM BICARBONATE PO Take 650 mg by mouth 2 (two) times daily. Takes 2 in AM and 2 at bedtime    . tacrolimus (PROGRAF) 1 MG capsule Take 4 mg by mouth 2 (two) times daily.     [name] triamcinolone (NASACORT ALLERGY 24HR) 55 MCG/ACT AERO nasal inhaler Place 2 sprays into the nose daily.    [name] triamcinolone cream (KENALOG) 0.1 % Apply 1 application topically daily.    . verapamil (CALAN-SR) 240 MG CR tablet Take 240 mg by mouth daily.     [name] sulfamethoxazole-trimethoprim (BACTRIM,SEPTRA) 400-80 MG tablet Take 1 tablet by mouth daily.     No facility-administered  medications prior to visit.     Allergies  Allergen Reactions  . Cefaclor Rash  . Naproxen Rash  . Enalapril Maleate Cough    Vasotec  . Metoprolol Tartrate Rash    On legs    ROS As per HPI  PE: Blood pressure 136/84, pulse 100, temperature 98.2 F (36.8 C), temperature source Oral, resp. rate 16, height 5\' 5"  (1.651 m), weight 246 lb 3.2 oz (111.7 kg), SpO2 95 %. Gen: Alert, well appearing.  Patient is oriented to person, place, time, and situation. AFFECT: pleasant, lucid thought and speech. ENT: Ears: EACs clear, normal epithelium.  TMs with good light reflex and landmarks bilaterally.  Eyes: no injection, icteris, swelling, or exudate.  EOMI, PERRLA.  Nose: no drainage or turbinate edema/swelling.  No injection or focal lesion.  Mouth: lips without lesion/swelling.  Oral mucosa pink and moist.  Dentition intact and without obvious caries or gingival swelling.  Oropharynx without erythema, exudate, or swelling.  Neck - No masses or thyromegaly or limitation in range of motion.  Carotids 1+ bilat, no bruits. CV: RRR, no m/r/g.   LUNGS: CTA bilat, nonlabored resps, good aeration in all lung fields. EXT: no clubbing or cyanosis.  no edema.     LABS:   Lab Results  Component Value Date   TSH 1.09 09/10/2018      Chemistry      Component Value Date/Time   NA 140 06/21/2018 0902   K 4.2 06/21/2018 0902   CL 108 06/21/2018 0902   CO2 25 06/21/2018 0902   BUN 21 06/21/2018 0902   CREATININE 0.97 06/21/2018 0902   CREATININE 3.61 (H) 09/16/2015 1515      Component Value Date/Time   CALCIUM 9.5 06/21/2018 0902   CALCIUM 8.4 08/30/2012 0819   ALKPHOS 71 12/14/2012 0415   AST 24 12/14/2012 0415   ALT 21 12/14/2012 0415   BILITOT 0.3 12/14/2012 0415      Audiogram:  R and L ear normal at 500 and 1000 hz, normal in R ear at 2000 and 4000 hz as well. In L ear she required 40 db to hear 2000 and 4000 hz.  IMPRESSION AND PLAN:  1) Whoosh in R  ear->tinnitus. Audiogram pretty unremarkable. Question eustachian tube dysfunction, but BP quite uncontrolled and would rather hold off on any change in nasal/sinus med tx and get bp under control first.  Then maybe the whoosh sound will abate! Reassured her, asked her to blow against closed nostrils 2-3 times a day to open eustachian tubes.  Continue steroid nasal spray.  2) HTN, uncontrolled-->pretty labile.  Add amlodipine 10mg  qd. She has had bad experiences ("felt awful") with beta blockers in the past, plus had prerenal azo +/- hyperkalemia with up-titration of her losartan in the past. Check bp and HR bid for the next 10d and we'll do a telephone visit in 10d.  An After Visit Summary was printed and given to the patient.  FOLLOW UP: Return for telephone f/u 10d.  Signed:  Crissie Sickles, MD           10/29/2018

## 2018-11-02 DIAGNOSIS — Z940 Kidney transplant status: Secondary | ICD-10-CM | POA: Diagnosis not present

## 2018-11-08 ENCOUNTER — Ambulatory Visit (INDEPENDENT_AMBULATORY_CARE_PROVIDER_SITE_OTHER): Payer: Medicare Other | Admitting: Family Medicine

## 2018-11-08 ENCOUNTER — Encounter: Payer: Self-pay | Admitting: Family Medicine

## 2018-11-08 ENCOUNTER — Other Ambulatory Visit: Payer: Self-pay

## 2018-11-08 VITALS — BP 133/82 | HR 81 | Temp 97.6°F

## 2018-11-08 DIAGNOSIS — H9319 Tinnitus, unspecified ear: Secondary | ICD-10-CM

## 2018-11-08 DIAGNOSIS — I10 Essential (primary) hypertension: Secondary | ICD-10-CM | POA: Diagnosis not present

## 2018-11-08 DIAGNOSIS — N183 Chronic kidney disease, stage 3 unspecified: Secondary | ICD-10-CM

## 2018-11-08 NOTE — Progress Notes (Signed)
Virtual Visit via Video Note  I connected with pt on 11/08/18 at 10:15 AM EDT by a video enabled telemedicine application and verified that I am speaking with the correct person using two identifiers.  Location patient: home Location provider:work or home office Persons participating in the virtual visit: patient, provider  I discussed the limitations of evaluation and management by telemedicine and the availability of in person appointments. The patient expressed understanding and agreed to proceed.   HPI: This is a 10 d f/u for atypical tinnitus sensation in R ear + uncontrolled HTN. Ear exam was normal, hearing screen unremarkable last visit. Question of some eustachian tube dysfxn, but bp not well controlled and plan was to get this more controlled and then see how the tinnitus responded. I added amlodipine 10mg  qd at that time.  Interim Hx: The whooshing is much improved, hearing normal. No popping sensation.  No ear pain.   Since last visit her bp's have been excellent:  111-130/70s.  She goes to [facility] for her annual f/u very soon-->it has been 3 yrs since her kidney transplant!   ROS: See pertinent positives and negatives per HPI.  Past Medical History:  Diagnosis Date  . Anemia of chronic kidney failure    r/t kidney function- receives Procrit  . Anxiety   . Chronic renal insufficiency, stage III (moderate) (HCC) 10/2015   Post-transplant Cr 1.3, GFR 41 as of June 2017 f/u w/ [facility]  . CMV infection [facility]) summer 2017   Valcyte per ID/Renal transplant team  . Diverticulosis    a. 05/2012 colonoscopy  . Erosive lichen planus of vulva    Topical steroids (managed by Dr. Mila [facility].  . ESRD (end stage renal disease) ([place])    began dialysis 2014--followed by Dr. [name].  Received deceased donor kidney transplant 10/2015.  [name] FSGS (focal segmental glomerulosclerosis)    right kidney; hx of left renal cell cancer and got nephrectomy 1993.  [name] GERD (gastroesophageal reflux disease)   . Gout   . Heart murmur    (Diastolic) ECHO 10/2949 showed that this murmur is coming from pt's R arm AV fistula  . Hiatal hernia   . History of renal cell cancer 1993   Left nephrectomy  . Hyperlipidemia   . Hypertension   . Hypothyroidism   . Impingement syndrome of left shoulder 04/2016   Dr. Christy Sartorius to PT  . Metatarsal fracture, pathologic 01/2018   Right; orthocarolina-->post op shoe continued, f/u x-ray planned.  . Osteoarthritis of left knee 08/2017   severe, diffuse, tricompartmental.  Responded to steroid injection.    . Osteoporosis 06/07/2016   DEXA T-score of -3.1.  Fosamax planned 2017 but dental work prevented start..  Pathologic toe fx 12/2017--repeat DEXA 08/2018, T-score -3.3 radius.  . Renal transplant recipient 11/06/2015   Baseline Cr as of 02/2018= 1.0-1.2.  . SVT (supraventricular tachycardia) (HCC)     Past Surgical History:  Procedure Laterality Date  . AV FISTULA PLACEMENT Right    Right arm: aneurismal dilatation 2017 being followed by CV surgeons  . CARDIOVASCULAR STRESS TEST     03/10/15 ETT ([facility]): Exercise ECG negative at 83% max predicted HR.   [name] COLONOSCOPY  2003; 05/2012   diverticulosis, no polyps.  Recall 10 yrs  . colonoscopy with polypectomy     Dr Henrene Pastor  . DEXA  06/07/2016; 08/22/2018   2017 T-score -3.1.  2020 T score -3.3  .  KIDNEY TRANSPLANT  11/06/15   Deceased donor kidney transplant, with Thymoglobulin induction  . LIGATION OF ARTERIOVENOUS  FISTULA Right 02/14/2017   Procedure: LIGATION OF ARTERIOVENOUS  FISTULA;  Surgeon: Angelia Mould, MD;  Location: [place];  Service: Vascular;  Laterality: Right;  . NEPHRECTOMY  1993   for malignancy- left  . RENAL BIOPSY     right  . RESECTION OF ARTERIOVENOUS FISTULA ANEURYSM Right 02/14/2017   Procedure: EXCISION OF RIGHT BRACHIOCEPHALIC ARTERIOVENOUS FISTULA  ANEURYSM;  Surgeon: Angelia Mould, MD;  Location: [place];  Service: Vascular;  Laterality: Right;  . TEE WITHOUT CARDIOVERSION N/A 08/27/2013   Procedure: TRANSESOPHAGEAL ECHOCARDIOGRAM (TEE);  Surgeon: [name], MD;  Location: [facility] ENDOSCOPY;  Service: Cardiovascular;  Laterality: N/A;  . TOTAL ABDOMINAL HYSTERECTOMY W/ BILATERAL SALPINGOOPHORECTOMY  1997   fibroids  . TRANSTHORACIC ECHOCARDIOGRAM     03/10/15 echo ([facility]): LV cavity normal in size, focal basal hypertrophy, normal systolic function, EF 88% (visual est). Normal wall motion, no regional wall motion abnormalities. Mild diastolic dysfunction with normal LA chamber size. No significant valve stenosis or regurgitation.  . VULVA / PERINEUM BIOPSY  2015    Family History  Problem Relation Age of Onset  . Deep vein thrombosis Mother        post thyroid surgery  . Hypertension Mother   . Heart attack Father 11       deceased  . Hypertension Father   . Cancer Sister        breast  . Cancer Paternal Aunt        pancreatic  . Diabetes Maternal Grandfather   . Stroke Paternal Grandmother        in 55s  . Colon cancer Neg Hx   . Esophageal cancer Neg Hx   . Stomach cancer Neg Hx   . Rectal cancer Neg Hx       Current Outpatient Medications:  .  acetaminophen (TYLENOL) 500 MG tablet, Take 500-750 mg by mouth every 6 (six) hours as needed for pain., Disp: , Rfl:  .  amLODipine (NORVASC) 10 MG tablet, Take 1 tablet (10 mg total) by mouth daily., Disp: 30 tablet, Rfl: 0 .  aspirin EC 81 MG EC tablet, Take 1 tablet (81 mg total) by mouth daily., Disp: , Rfl:  .  AYR SALINE NASAL DROPS NA, Place 2 sprays into both nostrils daily., Disp: , Rfl:  .  Camphor-Eucalyptus-Menthol (VICKS VAPORUB EX), Apply 1 application topically daily., Disp: , Rfl:  .  doxazosin (CARDURA) 2 MG tablet, Take 2 mg by mouth 2 (two) times daily. , Disp: , Rfl:  .  fluticasone (FLONASE) 50 MCG/ACT nasal spray, one spray by  Both Nostrils route daily., Disp: , Rfl:  .  levothyroxine (SYNTHROID, LEVOTHROID) 125 MCG tablet, Take 1 tablet (125 mcg total) by mouth daily., Disp: 90 tablet, Rfl: 1 .  losartan (COZAAR) 50 MG tablet, Take 1 tablet (50 mg total) by mouth daily., Disp: 30 tablet, Rfl: 6 .  meclizine (ANTIVERT) 25 MG tablet, Take 1 tablet (25 mg total) by mouth 3 (three) times daily as needed for dizziness or nausea., Disp: 20 tablet, Rfl: 0 .  mycophenolate (MYFORTIC) 360 MG TBEC EC tablet, Take 360 mg by mouth 2 (two) times daily., Disp: , Rfl:  .  silver sulfADIAZINE (SILVADENE) 1 % cream, Apply 1 application topically daily. Mixes with Triamcinolone cream, Disp: , Rfl:  .  SODIUM BICARBONATE PO, Take 650 mg by  mouth 2 (two) times daily. Takes 2 in AM and 2 at bedtime, Disp: , Rfl:  .  tacrolimus (PROGRAF) 1 MG capsule, Take 4 mg by mouth 2 (two) times daily. Pt takes 3 in the morning and 3 at night., Disp: , Rfl:  .  triamcinolone (NASACORT ALLERGY 24HR) 55 MCG/ACT AERO nasal inhaler, Place 2 sprays into the nose daily., Disp: , Rfl:  .  triamcinolone cream (KENALOG) 0.1 %, Apply 1 application topically daily., Disp: , Rfl:  .  verapamil (CALAN-SR) 240 MG CR tablet, Take 240 mg by mouth daily. , Disp: , Rfl:   LABS: none today    Chemistry      Component Value Date/Time   NA 140 06/21/2018 0902   K 4.2 06/21/2018 0902   CL 108 06/21/2018 0902   CO2 25 06/21/2018 0902   BUN 21 06/21/2018 0902   CREATININE 0.97 06/21/2018 0902   CREATININE 3.61 (H) 09/16/2015 1515      Component Value Date/Time   CALCIUM 9.5 06/21/2018 0902   CALCIUM 8.4 08/30/2012 0819   ALKPHOS 71 12/14/2012 0415   AST 24 12/14/2012 0415   ALT 21 12/14/2012 0415   BILITOT 0.3 12/14/2012 0415     Lab Results  Component Value Date   WBC 5.5 09/16/2015   HGB 12.6 02/14/2017   HCT 37.0 02/14/2017   MCV 94.8 09/16/2015   PLT 179 09/16/2015    EXAM:  VITALS per patient if applicable: BP 382/50 (BP Location: Left Arm,  Patient Position: Sitting, Cuff Size: Large)   Pulse 81   Temp 97.6 F (36.4 C) (Oral)    GENERAL: alert, oriented, appears well and in no acute distress  HEENT: atraumatic, conjunttiva clear, no obvious abnormalities on inspection of external nose and ears  NECK: normal movements of the head and neck  LUNGS: on inspection no signs of respiratory distress, breathing rate appears normal, no obvious gross SOB, gasping or wheezing  CV: no obvious cyanosis  MS: moves all visible extremities without noticeable abnormality  PSYCH/NEURO: pleasant and cooperative, no obvious depression or anxiety, speech and thought processing grossly intact  ASSESSMENT AND PLAN:  Discussed the following assessment and plan:  1) HTN: now well controlled. Minimal ankle swelling x 1 day since getting on amlodipine.  Doing great!  2) Atypical tinnitus: unclear etiology but it is improving/resolving as bp normalizes consistently so could have been related to uncontrolled htn. Watchful waiting for complete resolution.  3) CRI III, hx of renal transplant 3 yrs ago: doing well. Has f/u with her transplant clinic later this month.   I discussed the assessment and treatment plan with the patient. The patient was provided an opportunity to ask questions and all were answered. The patient agreed with the plan and demonstrated an understanding of the instructions.   The patient was advised to call back or seek an in-person evaluation if the symptoms worsen or if the condition fails to improve as anticipated.  I provided 15 minutes of non-face-to-face time during this encounter.  F/u 3 mo cpe  Signed:  Crissie Sickles, MD           11/08/2018

## 2018-11-12 ENCOUNTER — Other Ambulatory Visit: Payer: Self-pay

## 2018-11-12 ENCOUNTER — Encounter: Payer: Self-pay | Admitting: Family Medicine

## 2018-11-12 ENCOUNTER — Ambulatory Visit (INDEPENDENT_AMBULATORY_CARE_PROVIDER_SITE_OTHER): Payer: Medicare Other | Admitting: Family Medicine

## 2018-11-12 ENCOUNTER — Ambulatory Visit: Payer: Self-pay

## 2018-11-12 VITALS — BP 144/91 | HR 83

## 2018-11-12 DIAGNOSIS — I471 Supraventricular tachycardia: Secondary | ICD-10-CM

## 2018-11-12 MED ORDER — VERAPAMIL HCL ER 240 MG PO TBCR: EXTENDED_RELEASE_TABLET | ORAL | 1 refills | Status: DC

## 2018-11-12 MED ORDER — VERAPAMIL HCL ER 120 MG PO TBCR: EXTENDED_RELEASE_TABLET | ORAL | 1 refills | Status: DC

## 2018-11-12 NOTE — Telephone Encounter (Addendum)
Pt called stating she got up this morning and had a funny weak feeling she took her BP and it was 123/90 and her HR was 144  She rechecked and it was 119/82 HR 145. Another check with me ant BP 150/94 HR 93.  She denies fainting chest pain sweats or dizziness. She states this scared her.  She has Hx of kidney transplant.  She has just started a new BP medication in the past month Amlodipine. Per protocol call was transferred to [facility] at the [facility] office. E mail verified.  Reason for Disposition . [1] Heart beating very rapidly (e.g., > 140 / minute) AND [2] not present now  (Exception: during exercise)  Answer Assessment - Initial Assessment Questions 1. DESCRIPTION: "Please describe your heart rate or heart beat that you are having" (e.g., fast/slow, regular/irregular, skipped or extra beats, "palpitations")     To fast 2. ONSET: "When did it start?" (Minutes, hours or days)      About 1/2 hour go 3. DURATION: "How long does it last" (e.g., seconds, minutes, hours)     A couple minutes 4. PATTERN "Does it come and go, or has it been constant since it started?"  "Does it get worse with exertion?"   "Are you feeling it now?"    Felt weak  5. TAP: "Using your hand, can you tap out what you are feeling on a chair or table in front of you, so that I can hear?" (Note: not all patients can do this)       Regular now 6. HEART RATE: "Can you tell me your heart rate?" "How many beats in 15 seconds?"  (Note: not all patients can do this)       144, 145, 90 7. RECURRENT SYMPTOM: "Have you ever had this before?" If so, ask: "When was the last time?" and "What happened that time?"      Maybe once in a while 8. CAUSE: "What do you think is causing the palpitations?"     Nothing  9. CARDIAC HISTORY: "Do you have any history of heart disease?" (e.g., heart attack, angina, bypass surgery, angioplasty, arrhythmia)      No had TEE  Took dialysis for 4 years now has kidney transplant 10. OTHER SYMPTOMS: "Do  you have any other symptoms?" (e.g., dizziness, chest pain, sweating, difficulty breathing)      no 11. PREGNANCY: "Is there any chance you are pregnant?" "When was your last menstrual period?"       N/A  Protocols used: HEART RATE AND HEARTBEAT QUESTIONS-A-AH

## 2018-11-12 NOTE — Progress Notes (Signed)
Virtual Visit via Video Note  I connected with pt on 11/12/18 at  2:00 PM EDT by a video enabled telemedicine application and verified that I am speaking with the correct person using two identifiers.  Location patient: home Location provider:work office Persons participating in the virtual visit: patient, myself.  I discussed the limitations of evaluation and management by telemedicine and the availability of in person appointments. The patient expressed understanding and agreed to proceed.   HPI: "episode" This morning pt felt very weak, onset immediately while standing up, she went and sat down. She felt it gradually dissipate over 5 min or so.  She checked her bp and HR during this time.  See below.  No dizziness, presyncope, nausea, diaphoresis, focal weakness, CP, or SOB.  This usually happens 2-3 times per week on avg and has been this way for months (although verapamil has been a long term med for her). HR 140s, bp 120s/80s, checked x 2. On phone with [facility] bp 150s/90s and HR 93. In review of HR's on record in our EMR going back to 05/2017 her HR avg is about 85, highest HR 100 on 10/29/18. She says her legs have felt like they feel asleep more often since getting on amlodipine, but her periods of rapid heart rate are no different. She does have a hx of SVT and is on verapamil CR 240mg  qd. I did start her on amlodipine 2 wks ago but at a f/u visit a week later she had no sx's of palpitations or tachycardia.    ROS: See pertinent positives and negatives per HPI.  Past Medical History:  Diagnosis Date  . Anemia of chronic kidney failure    r/t kidney function- receives Procrit  . Anxiety   . Chronic renal insufficiency, stage III (moderate) (HCC) 10/2015   Post-transplant Cr 1.3, GFR 41 as of June 2017 f/u w/ [facility]  . CMV infection [facility]) summer 2017   Valcyte per ID/Renal transplant team  . Diverticulosis    a. 05/2012 colonoscopy  . Erosive lichen planus  of vulva    Topical steroids (managed by Dr. Mila [facility].  . ESRD (end stage renal disease) ([place])    began dialysis 2014--followed by Dr. [name].  Received deceased donor kidney transplant 10/2015.  [name] FSGS (focal segmental glomerulosclerosis)    right kidney; hx of left renal cell cancer and got nephrectomy 1993.  [name] GERD (gastroesophageal reflux disease)   . Gout   . Heart murmur    (Diastolic) ECHO 02/4258 showed that this murmur is coming from pt's R arm AV fistula  . Hiatal hernia   . History of renal cell cancer 1993   Left nephrectomy  . Hyperlipidemia   . Hypertension   . Hypothyroidism   . Impingement syndrome of left shoulder 04/2016   Dr. Christy Sartorius to PT  . Metatarsal fracture, pathologic 01/2018   Right; orthocarolina-->post op shoe continued, f/u x-ray planned.  . Osteoarthritis of left knee 08/2017   severe, diffuse, tricompartmental.  Responded to steroid injection.    . Osteoporosis 06/07/2016   DEXA T-score of -3.1.  Fosamax planned 2017 but dental work prevented start..  Pathologic toe fx 12/2017--repeat DEXA 08/2018, T-score -3.3 radius.  . Renal transplant recipient 11/06/2015   Baseline Cr as of 02/2018= 1.0-1.2.  . SVT (supraventricular tachycardia) (HCC)     Past Surgical History:  Procedure Laterality Date  . AV FISTULA PLACEMENT Right    Right  arm: aneurismal dilatation 2017 being followed by CV surgeons  . CARDIOVASCULAR STRESS TEST     03/10/15 ETT ([facility]): Exercise ECG negative at 83% max predicted HR.   [name] COLONOSCOPY  2003; 05/2012   diverticulosis, no polyps.  Recall 10 yrs  . colonoscopy with polypectomy     Dr Henrene Pastor  . DEXA  06/07/2016; 08/22/2018   2017 T-score -3.1.  2020 T score -3.3  . KIDNEY TRANSPLANT  17-Nov-2015   Deceased donor kidney transplant, with Thymoglobulin induction  . LIGATION OF ARTERIOVENOUS  FISTULA Right 02/14/2017   Procedure: LIGATION OF ARTERIOVENOUS  FISTULA;   Surgeon: Angelia Mould, MD;  Location: [place];  Service: Vascular;  Laterality: Right;  . NEPHRECTOMY  1993   for malignancy- left  . RENAL BIOPSY     right  . RESECTION OF ARTERIOVENOUS FISTULA ANEURYSM Right 02/14/2017   Procedure: EXCISION OF RIGHT BRACHIOCEPHALIC ARTERIOVENOUS FISTULA ANEURYSM;  Surgeon: Angelia Mould, MD;  Location: [place];  Service: Vascular;  Laterality: Right;  . TEE WITHOUT CARDIOVERSION N/A 08/27/2013   Procedure: TRANSESOPHAGEAL ECHOCARDIOGRAM (TEE);  Surgeon: [name], MD;  Location: [facility] ENDOSCOPY;  Service: Cardiovascular;  Laterality: N/A;  . TOTAL ABDOMINAL HYSTERECTOMY W/ BILATERAL SALPINGOOPHORECTOMY  1997   fibroids  . TRANSTHORACIC ECHOCARDIOGRAM     03/10/15 echo ([facility]): LV cavity normal in size, focal basal hypertrophy, normal systolic function, EF 56% (visual est). Normal wall motion, no regional wall motion abnormalities. Mild diastolic dysfunction with normal LA chamber size. No significant valve stenosis or regurgitation.  . VULVA / PERINEUM BIOPSY  2015    Family History  Problem Relation Age of Onset  . Deep vein thrombosis Mother        post thyroid surgery  . Hypertension Mother   . Heart attack Father 10       deceased  . Hypertension Father   . Cancer Sister        breast  . Cancer Paternal Aunt        pancreatic  . Diabetes Maternal Grandfather   . Stroke Paternal Grandmother        in 36s  . Colon cancer Neg Hx   . Esophageal cancer Neg Hx   . Stomach cancer Neg Hx   . Rectal cancer Neg Hx     SOCIAL HX: none   Current Outpatient Medications:  .  acetaminophen (TYLENOL) 500 MG tablet, Take 500-750 mg by mouth every 6 (six) hours as needed for pain., Disp: , Rfl:  .  amLODipine (NORVASC) 10 MG tablet, Take 1 tablet (10 mg total) by mouth daily., Disp: 30 tablet, Rfl: 0 .  aspirin EC 81 MG EC tablet, Take 1 tablet (81 mg total) by mouth daily., Disp: , Rfl:  .  AYR SALINE NASAL DROPS  NA, Place 2 sprays into both nostrils daily., Disp: , Rfl:  .  Camphor-Eucalyptus-Menthol (VICKS VAPORUB EX), Apply 1 application topically daily., Disp: , Rfl:  .  doxazosin (CARDURA) 2 MG tablet, Take 2 mg by mouth 2 (two) times daily. , Disp: , Rfl:  .  fluticasone (FLONASE) 50 MCG/ACT nasal spray, one spray by Both Nostrils route daily., Disp: , Rfl:  .  levothyroxine (SYNTHROID, LEVOTHROID) 125 MCG tablet, Take 1 tablet (125 mcg total) by mouth daily., Disp: 90 tablet, Rfl: 1 .  losartan (COZAAR) 50 MG tablet, Take 1 tablet (50 mg total) by mouth daily., Disp: 30 tablet, Rfl: 6 .  meclizine (ANTIVERT)  25 MG tablet, Take 1 tablet (25 mg total) by mouth 3 (three) times daily as needed for dizziness or nausea., Disp: 20 tablet, Rfl: 0 .  mycophenolate (MYFORTIC) 360 MG TBEC EC tablet, Take 360 mg by mouth 2 (two) times daily., Disp: , Rfl:  .  silver sulfADIAZINE (SILVADENE) 1 % cream, Apply 1 application topically daily. Mixes with Triamcinolone cream, Disp: , Rfl:  .  SODIUM BICARBONATE PO, Take 650 mg by mouth 2 (two) times daily. Takes 2 in AM and 2 at bedtime, Disp: , Rfl:  .  tacrolimus (PROGRAF) 1 MG capsule, Take 4 mg by mouth 2 (two) times daily. Pt takes 3 in the morning and 3 at night., Disp: , Rfl:  .  triamcinolone (NASACORT ALLERGY 24HR) 55 MCG/ACT AERO nasal inhaler, Place 2 sprays into the nose daily., Disp: , Rfl:  .  triamcinolone cream (KENALOG) 0.1 %, Apply 1 application topically daily., Disp: , Rfl:  .  verapamil (CALAN-SR) 240 MG CR tablet, Take 240 mg by mouth daily. , Disp: , Rfl:   EXAM:  VITALS per patient if applicable: BP (!) 673/41 (BP Location: Left Arm, Patient Position: Sitting, Cuff Size: Normal)   Pulse 83    GENERAL: alert, oriented, appears well and in no acute distress  HEENT: atraumatic, conjunttiva clear, no obvious abnormalities on inspection of external nose and ears  NECK: normal movements of the head and neck  LUNGS: on inspection no signs of  respiratory distress, breathing rate appears normal, no obvious gross SOB, gasping or wheezing  CV: no obvious cyanosis  MS: moves all visible extremities without noticeable abnormality  PSYCH/NEURO: pleasant and cooperative, no obvious depression or anxiety, speech and thought processing grossly intact  LABS: none today  Lab Results  Component Value Date   TSH 1.09 09/10/2018     Chemistry      Component Value Date/Time   NA 140 06/21/2018 0902   K 4.2 06/21/2018 0902   CL 108 06/21/2018 0902   CO2 25 06/21/2018 0902   BUN 21 06/21/2018 0902   CREATININE 0.97 06/21/2018 0902   CREATININE 3.61 (H) 09/16/2015 1515      Component Value Date/Time   CALCIUM 9.5 06/21/2018 0902   CALCIUM 8.4 08/30/2012 0819   ALKPHOS 71 12/14/2012 0415   AST 24 12/14/2012 0415   ALT 21 12/14/2012 0415   BILITOT 0.3 12/14/2012 0415      ASSESSMENT AND PLAN:  Discussed the following assessment and plan:  1) Symptomatic tachycardia, paroxysmal. Atrial tachy, sinus tachy, or AVNRT. She has 2 episodes or so per week but they are too brief to treat with "pill in a pocket". I will continue her on  Calan SR 240 mg qAM and add 120 mg Calan SR every afternoon. I don't thing her amlodipine is affecting these, but this med may be causing her sensation of legs feeling asleep more. We'll go ahead and d/c the amlodipine. Check BMET, TSH, and magnesium ASAP. Monitor BP and HR as per her usual. If these get worse or persist despite increase in Calan SR then will get event monitor or have her see her cardiologist.  I discussed the assessment and treatment plan with the patient. The patient was provided an opportunity to ask questions and all were answered. The patient agreed with the plan and demonstrated an understanding of the instructions.   The patient was advised to call back or seek an in-person evaluation if the symptoms worsen or if the condition fails  to improve as anticipated.  F/U: 1 wk  virtual visit.  Signed:  Crissie Sickles, MD           11/12/2018

## 2018-11-13 DIAGNOSIS — Z79899 Other long term (current) drug therapy: Secondary | ICD-10-CM | POA: Diagnosis not present

## 2018-11-13 DIAGNOSIS — Z940 Kidney transplant status: Secondary | ICD-10-CM | POA: Diagnosis not present

## 2018-11-13 DIAGNOSIS — I10 Essential (primary) hypertension: Secondary | ICD-10-CM | POA: Diagnosis not present

## 2018-11-14 ENCOUNTER — Other Ambulatory Visit (INDEPENDENT_AMBULATORY_CARE_PROVIDER_SITE_OTHER): Payer: Medicare Other

## 2018-11-14 DIAGNOSIS — I471 Supraventricular tachycardia: Secondary | ICD-10-CM | POA: Diagnosis not present

## 2018-11-14 LAB — BASIC METABOLIC PANEL
BUN: 20 mg/dL (ref 6–23)
CO2: 24 mEq/L (ref 19–32)
Calcium: 9.4 mg/dL (ref 8.4–10.5)
Chloride: 107 mEq/L (ref 96–112)
Creatinine, Ser: 1.22 mg/dL — ABNORMAL HIGH (ref 0.40–1.20)
GFR: 43.18 mL/min — ABNORMAL LOW (ref >60.00)
Glucose, Bld: 94 mg/dL (ref 70–99)
Potassium: 4 mEq/L (ref 3.5–5.1)
Sodium: 140 mEq/L (ref 135–145)

## 2018-11-14 LAB — MAGNESIUM: Magnesium: 1.4 mg/dL — ABNORMAL LOW (ref 1.5–2.5)

## 2018-11-14 LAB — TSH: TSH: 4.36 u[IU]/mL (ref 0.35–4.50)

## 2018-11-15 ENCOUNTER — Telehealth: Payer: Self-pay

## 2018-11-15 DIAGNOSIS — R7989 Other specified abnormal findings of blood chemistry: Secondary | ICD-10-CM

## 2018-11-15 NOTE — Telephone Encounter (Signed)
Pt was called and given all information. Lab visit scheduled and future orders placed. Pt verbalized understanding

## 2018-11-15 NOTE — Telephone Encounter (Signed)
-----   Message from Tabitha Sou, MD sent at 11/14/2018  4:11 PM EDT ----- Labs overall good but magnesium just a tiny bit low (hers was 1.4 and the lower limit of normal is 1.5). Also, her kidney function was down a little bit, most likely b/c she is not drinking enough clear fluids. She needs to be drinking AT LEAST 65 ounces of water,(some gatorade or powerade too) per day. Have her increase fluid intake and have her buy an over the counter bottle of magnesieum oxide 500 mg tabs and take one every day. Needs lab visit to recheck BMET and Magnesium level in 2 wks--pls order future labs, dx is hypomagnesemia (or low magnesium) and elevated creatinine.-thx

## 2018-11-19 ENCOUNTER — Other Ambulatory Visit: Payer: Self-pay

## 2018-11-19 ENCOUNTER — Encounter: Payer: Self-pay | Admitting: Family Medicine

## 2018-11-19 ENCOUNTER — Ambulatory Visit (INDEPENDENT_AMBULATORY_CARE_PROVIDER_SITE_OTHER): Payer: Medicare Other | Admitting: Family Medicine

## 2018-11-19 VITALS — BP 135/93 | HR 64

## 2018-11-19 DIAGNOSIS — R7989 Other specified abnormal findings of blood chemistry: Secondary | ICD-10-CM | POA: Diagnosis not present

## 2018-11-19 DIAGNOSIS — R002 Palpitations: Secondary | ICD-10-CM | POA: Diagnosis not present

## 2018-11-19 NOTE — Progress Notes (Signed)
Virtual Visit via Video Note  I connected with pt  on 11/19/18 at 10:00 AM EDT by a video enabled telemedicine application and verified that I am speaking with the correct person using two identifiers.  Location patient: home Location provider:work office Persons participating in the virtual visit: patient, myself.  I discussed the limitations of evaluation and management by telemedicine and the availability of in person appointments. The patient expressed understanding and agreed to proceed.  Telemedicine visit is a necessity given the COVID-19 restrictions in place at the current time.   HPI: [name], one wk f/u tachypalpitations that I felt were symptomatic sinus tachy, MAT or PSVT.  Since that was a virtual visit, I had no way of checking an EKG.  She was not symptomatic at the time of the visit. Her episodes were to brief for "pill in a pocket" to be of benefit, so I chose to add a dose of calan SR 120mg  every afternoon to go with her usual morning dose of calan SR 240mg . I d/c'd amlodipine for the paresthesias sensation in her legs, not b/c I thought it had anything to do with her palpitations. I checked lytes/mag/TSH.  Mag 1.4 (LL nl), and sCr had gone up some so I emphasized the need for extra hydration and addded small dose of supplemental magnesium.  She has been feeling better.  No episodes of palpitations, lightheadedness, or other prob since last visit.  She takes the mag with her afternoon calan SR--has taken mag only 2 d in a row now. She has been increasing fluid intake since last visit. No side effect from afternoon calan SR. BP's normal and HR's around 60.  ROS: See pertinent positives and negatives per HPI.  Past Medical History:  Diagnosis Date  . Anemia of chronic kidney failure    r/t kidney function- receives Procrit  . Anxiety   . Chronic renal insufficiency, stage III (moderate) (HCC) 10/2015   Post-transplant Cr 1.3, GFR 41 as of June 2017 f/u w/ [facility]  . CMV infection [facility]) summer 2017   Valcyte per ID/Renal transplant team  . Diverticulosis    a. 05/2012 colonoscopy  . Erosive lichen planus of vulva    Topical steroids (managed by Dr. Mila [facility].  . ESRD (end stage renal disease) ([place])    began dialysis 2014--followed by Dr. [name].  Received deceased donor kidney transplant 10/2015.  [name] FSGS (focal segmental glomerulosclerosis)    right kidney; hx of left renal cell cancer and got nephrectomy 1993.  [name] GERD (gastroesophageal reflux disease)   . Gout   . Heart murmur    (Diastolic) ECHO 03/7866 showed that this murmur is coming from pt's R arm AV fistula  . Hiatal hernia   . History of renal cell cancer 1993   Left nephrectomy  . Hyperlipidemia   . Hypertension   . Hypothyroidism   . Impingement syndrome of left shoulder 04/2016   Dr. Christy Sartorius to PT  . Metatarsal fracture, pathologic 01/2018   Right; orthocarolina-->post op shoe continued, f/u x-ray planned.  . Osteoarthritis of left knee 08/2017   severe, diffuse, tricompartmental.  Responded to steroid injection.    . Osteoporosis 06/07/2016   DEXA T-score of -3.1.  Fosamax planned 2017 but dental work prevented start..  Pathologic toe fx 12/2017--repeat DEXA 08/2018, T-score -3.3 radius.  . Renal transplant recipient 11/06/2015   Baseline Cr as of 02/2018= 1.0-1.2.  . SVT (supraventricular tachycardia) (  [facility])     Past Surgical History:  Procedure Laterality Date  . AV FISTULA PLACEMENT Right    Right arm: aneurismal dilatation 2017 being followed by CV surgeons  . CARDIOVASCULAR STRESS TEST     03/10/15 ETT ([facility]): Exercise ECG negative at 83% max predicted HR.   [name] COLONOSCOPY  2003; 05/2012   diverticulosis, no polyps.  Recall 10 yrs  . colonoscopy with polypectomy     Dr Henrene Pastor  . DEXA  06/07/2016; 08/22/2018   2017 T-score -3.1.  2020 T score -3.3  . KIDNEY TRANSPLANT  11/18/2015    Deceased donor kidney transplant, with Thymoglobulin induction  . LIGATION OF ARTERIOVENOUS  FISTULA Right 02/14/2017   Procedure: LIGATION OF ARTERIOVENOUS  FISTULA;  Surgeon: Angelia Mould, MD;  Location: [place];  Service: Vascular;  Laterality: Right;  . NEPHRECTOMY  1993   for malignancy- left  . RENAL BIOPSY     right  . RESECTION OF ARTERIOVENOUS FISTULA ANEURYSM Right 02/14/2017   Procedure: EXCISION OF RIGHT BRACHIOCEPHALIC ARTERIOVENOUS FISTULA ANEURYSM;  Surgeon: Angelia Mould, MD;  Location: [place];  Service: Vascular;  Laterality: Right;  . TEE WITHOUT CARDIOVERSION N/A 08/27/2013   Procedure: TRANSESOPHAGEAL ECHOCARDIOGRAM (TEE);  Surgeon: [name], MD;  Location: [facility] ENDOSCOPY;  Service: Cardiovascular;  Laterality: N/A;  . TOTAL ABDOMINAL HYSTERECTOMY W/ BILATERAL SALPINGOOPHORECTOMY  1997   fibroids  . TRANSTHORACIC ECHOCARDIOGRAM     03/10/15 echo ([facility]): LV cavity normal in size, focal basal hypertrophy, normal systolic function, EF 86% (visual est). Normal wall motion, no regional wall motion abnormalities. Mild diastolic dysfunction with normal LA chamber size. No significant valve stenosis or regurgitation.  . VULVA / PERINEUM BIOPSY  2015    Family History  Problem Relation Age of Onset  . Deep vein thrombosis Mother        post thyroid surgery  . Hypertension Mother   . Heart attack Father 3       deceased  . Hypertension Father   . Cancer Sister        breast  . Cancer Paternal Aunt        pancreatic  . Diabetes Maternal Grandfather   . Stroke Paternal Grandmother        in 34s  . Colon cancer Neg Hx   . Esophageal cancer Neg Hx   . Stomach cancer Neg Hx   . Rectal cancer Neg Hx      Current Outpatient Medications:  .  acetaminophen (TYLENOL) 500 MG tablet, Take 500-750 mg by mouth every 6 (six) hours as needed for pain., Disp: , Rfl:  .  amLODipine (NORVASC) 10 MG tablet, Take 1 tablet (10 mg total) by mouth  daily., Disp: 30 tablet, Rfl: 0 .  aspirin EC 81 MG EC tablet, Take 1 tablet (81 mg total) by mouth daily., Disp: , Rfl:  .  AYR SALINE NASAL DROPS NA, Place 2 sprays into both nostrils daily., Disp: , Rfl:  .  Camphor-Eucalyptus-Menthol (VICKS VAPORUB EX), Apply 1 application topically daily., Disp: , Rfl:  .  doxazosin (CARDURA) 2 MG tablet, Take 2 mg by mouth 2 (two) times daily. , Disp: , Rfl:  .  fluticasone (FLONASE) 50 MCG/ACT nasal spray, one spray by Both Nostrils route daily., Disp: , Rfl:  .  levothyroxine (SYNTHROID, LEVOTHROID) 125 MCG tablet, Take 1 tablet (125 mcg total) by mouth daily., Disp: 90 tablet, Rfl: 1 .  losartan (COZAAR) 50 MG tablet,  Take 1 tablet (50 mg total) by mouth daily., Disp: 30 tablet, Rfl: 6 .  meclizine (ANTIVERT) 25 MG tablet, Take 1 tablet (25 mg total) by mouth 3 (three) times daily as needed for dizziness or nausea., Disp: 20 tablet, Rfl: 0 .  mycophenolate (MYFORTIC) 360 MG TBEC EC tablet, Take 360 mg by mouth 2 (two) times daily., Disp: , Rfl:  .  silver sulfADIAZINE (SILVADENE) 1 % cream, Apply 1 application topically daily. Mixes with Triamcinolone cream, Disp: , Rfl:  .  SODIUM BICARBONATE PO, Take 650 mg by mouth 2 (two) times daily. Takes 2 in AM and 2 at bedtime, Disp: , Rfl:  .  tacrolimus (PROGRAF) 1 MG capsule, Take 4 mg by mouth 2 (two) times daily. Pt takes 3 in the morning and 3 at night., Disp: , Rfl:  .  triamcinolone (NASACORT ALLERGY 24HR) 55 MCG/ACT AERO nasal inhaler, Place 2 sprays into the nose daily., Disp: , Rfl:  .  triamcinolone cream (KENALOG) 0.1 %, Apply 1 application topically daily., Disp: , Rfl:  .  verapamil (CALAN-SR) 120 MG CR tablet, 1 tab po every afternoon, Disp: 30 tablet, Rfl: 1 .  verapamil (CALAN-SR) 240 MG CR tablet, 1 tab po every morning, Disp: 90 tablet, Rfl: 1  EXAM:  VITALS per patient if applicable: BP (!) 485/46 (BP Location: Left Arm, Patient Position: Sitting, Cuff Size: Large)   Pulse 64    GENERAL:  alert, oriented, appears well and in no acute distress  HEENT: atraumatic, conjunttiva clear, no obvious abnormalities on inspection of external nose and ears  NECK: normal movements of the head and neck  LUNGS: on inspection no signs of respiratory distress, breathing rate appears normal, no obvious gross SOB, gasping or wheezing  CV: no obvious cyanosis  MS: moves all visible extremities without noticeable abnormality  PSYCH/NEURO: pleasant and cooperative, no obvious depression or anxiety, speech and thought processing grossly intact  LAB: none today    Chemistry      Component Value Date/Time   NA 140 11/14/2018 1036   K 4.0 11/14/2018 1036   CL 107 11/14/2018 1036   CO2 24 11/14/2018 1036   BUN 20 11/14/2018 1036   CREATININE 1.22 (H) 11/14/2018 1036   CREATININE 3.61 (H) 09/16/2015 1515      Component Value Date/Time   CALCIUM 9.4 11/14/2018 1036   CALCIUM 8.4 08/30/2012 0819   ALKPHOS 71 12/14/2012 0415   AST 24 12/14/2012 0415   ALT 21 12/14/2012 0415   BILITOT 0.3 12/14/2012 0415     Lab Results  Component Value Date   TSH 4.36 11/14/2018    ASSESSMENT AND PLAN:  Discussed the following assessment and plan:  1) Tachypalpitations: sinus tach vs MAT vs PSVT-->doing great since increase in calan SR. Continue current dosing of this med and also continue magnesium supplement daily and continue current efforts at drinking more fluids. Of note, she had appt with her renal transplant clinic right after my visit with her last week and she was told everything was good..   Repeat BMET and Mag are ordered-future (1 week).  I discussed the assessment and treatment plan with the patient. The patient was provided an opportunity to ask questions and all were answered. The patient agreed with the plan and demonstrated an understanding of the instructions.   The patient was advised to call back or seek an in-person evaluation if the symptoms worsen or if the condition  fails to improve as anticipated.  F/u: keep  appt set for 02/18/2019  Signed:  Crissie Sickles, MD           11/19/2018

## 2018-11-22 DIAGNOSIS — Z940 Kidney transplant status: Secondary | ICD-10-CM | POA: Diagnosis not present

## 2018-11-22 DIAGNOSIS — I129 Hypertensive chronic kidney disease with stage 1 through stage 4 chronic kidney disease, or unspecified chronic kidney disease: Secondary | ICD-10-CM | POA: Diagnosis not present

## 2018-11-30 ENCOUNTER — Other Ambulatory Visit (INDEPENDENT_AMBULATORY_CARE_PROVIDER_SITE_OTHER): Payer: Medicare Other

## 2018-11-30 ENCOUNTER — Other Ambulatory Visit: Payer: Self-pay

## 2018-11-30 DIAGNOSIS — R7989 Other specified abnormal findings of blood chemistry: Secondary | ICD-10-CM | POA: Diagnosis not present

## 2018-11-30 LAB — BASIC METABOLIC PANEL
BUN: 22 mg/dL (ref 6–23)
CO2: 25 mEq/L (ref 19–32)
Calcium: 9.1 mg/dL (ref 8.4–10.5)
Chloride: 105 mEq/L (ref 96–112)
Creatinine, Ser: 1.08 mg/dL (ref 0.40–1.20)
GFR: 49.69 mL/min — ABNORMAL LOW (ref >60.00)
Glucose, Bld: 92 mg/dL (ref 70–99)
Potassium: 4.5 mEq/L (ref 3.5–5.1)
Sodium: 138 mEq/L (ref 135–145)

## 2018-11-30 LAB — MAGNESIUM: Magnesium: 1.5 mg/dL (ref 1.5–2.5)

## 2018-12-03 ENCOUNTER — Telehealth: Payer: Self-pay

## 2018-12-03 NOTE — Telephone Encounter (Signed)
My Chart message sent

## 2018-12-07 ENCOUNTER — Ambulatory Visit (INDEPENDENT_AMBULATORY_CARE_PROVIDER_SITE_OTHER): Payer: Medicare Other | Admitting: Family Medicine

## 2018-12-07 ENCOUNTER — Encounter: Payer: Self-pay | Admitting: Family Medicine

## 2018-12-07 VITALS — BP 136/77 | HR 69

## 2018-12-07 DIAGNOSIS — E538 Deficiency of other specified B group vitamins: Secondary | ICD-10-CM

## 2018-12-07 DIAGNOSIS — R209 Unspecified disturbances of skin sensation: Secondary | ICD-10-CM | POA: Diagnosis not present

## 2018-12-07 DIAGNOSIS — IMO0001 Reserved for inherently not codable concepts without codable children: Secondary | ICD-10-CM

## 2018-12-07 DIAGNOSIS — G609 Hereditary and idiopathic neuropathy, unspecified: Secondary | ICD-10-CM

## 2018-12-07 HISTORY — DX: Deficiency of other specified B group vitamins: E53.8

## 2018-12-07 NOTE — Progress Notes (Signed)
Virtual Visit via Video Note  I connected with pt on 12/07/18 at  2:20 PM EDT by a video enabled telemedicine application and verified that I am speaking with the correct person using two identifiers.  Location patient: home Location provider:work or home office Persons participating in the virtual visit: patient, provider  I discussed the limitations of evaluation and management by telemedicine and the availability of in person appointments. The patient expressed understanding and agreed to proceed.  Telemedicine visit is a necessity given the COVID-19 restrictions in place at the current time.  HPI: [name] being seen today for "tingling in both feet". For about two weeks she has felt like her feet have been cold. Cold hands as well.  Tingling, slight decreased sensation in all fingers. Also, describes tingling/burning sensation in balls and arches in both feet.  Wt bearing does not make it hurt worse.  No tingling/burning in toes. Feet burning more prominent the last couple days.  No color changes of feet/hands has been noted. Overall, the numb/cold feeling has been going on some lately, and the burning has been more prominent. Some chronic, waxing and waning pain in R hip and low back.  No radicular sx's.  No restless legs symptoms.  No hx of back surgery  As I ask more questions today her certainty about her symptoms becomes more and more cloudy and as per her usual she finds her symptoms hard to describe.  .  ROS: See pertinent positives and negatives per HPI.  Past Medical History:  Diagnosis Date  . Anemia of chronic kidney failure    r/t kidney function- receives Procrit  . Anxiety   . Chronic renal insufficiency, stage III (moderate) (HCC) 10/2015   Post-transplant Cr 1.3, GFR 41 as of June 2017 f/u w/ [facility]  . CMV infection [facility]) summer 2017   Valcyte per ID/Renal transplant team  . Diverticulosis    a. 05/2012 colonoscopy  . Erosive lichen  planus of vulva    Topical steroids (managed by Dr. Mila Palmer [facility] via [facility] out[facility].  . ESRD (end stage renal disease) ([place])    began dialysis 2014--followed by Dr. [name].  Received deceased donor kidney transplant 10/2015.  [name] FSGS (focal segmental glomerulosclerosis)    right kidney; hx of left renal cell cancer and got nephrectomy 1993.  [name] GERD (gastroesophageal reflux disease)   . Gout   . Heart murmur    (Diastolic) ECHO 08/5398 showed that this murmur is coming from pt's R arm AV fistula  . Hiatal hernia   . History of renal cell cancer 1993   Left nephrectomy  . Hyperlipidemia   . Hypertension   . Hypothyroidism   . Impingement syndrome of left shoulder 04/2016   Dr. Christy Sartorius to PT  . Metatarsal fracture, pathologic 01/2018   Right; orthocarolina-->post op shoe continued, f/u x-ray planned.  . Osteoarthritis of left knee 08/2017   severe, diffuse, tricompartmental.  Responded to steroid injection.    . Osteoporosis 06/07/2016   DEXA T-score of -3.1.  Fosamax planned 2017 but dental work prevented start..  Pathologic toe fx 12/2017--repeat DEXA 08/2018, T-score -3.3 radius.  . Renal transplant recipient 11/06/2015   Baseline Cr as of 02/2018= 1.0-1.2.  . SVT (supraventricular tachycardia) (HCC)     Past Surgical History:  Procedure Laterality Date  . AV FISTULA PLACEMENT Right    Right arm: aneurismal dilatation 2017 being followed by CV surgeons  . CARDIOVASCULAR STRESS TEST  03/10/15 ETT ([facility]): Exercise ECG negative at 83% max predicted HR.   [name] COLONOSCOPY  2003; 05/2012   diverticulosis, no polyps.  Recall 10 yrs  . colonoscopy with polypectomy     Dr Henrene Pastor  . DEXA  06/07/2016; 08/22/2018   2017 T-score -3.1.  2020 T score -3.3  . KIDNEY TRANSPLANT  11-26-2015   Deceased donor kidney transplant, with Thymoglobulin induction  . LIGATION OF ARTERIOVENOUS  FISTULA Right 02/14/2017   Procedure: LIGATION OF ARTERIOVENOUS   FISTULA;  Surgeon: Angelia Mould, MD;  Location: [facility];  Service: Vascular;  Laterality: Right;  . NEPHRECTOMY  1993   for malignancy- left  . RENAL BIOPSY     right  . RESECTION OF ARTERIOVENOUS FISTULA ANEURYSM Right 02/14/2017   Procedure: EXCISION OF RIGHT BRACHIOCEPHALIC ARTERIOVENOUS FISTULA ANEURYSM;  Surgeon: Angelia Mould, MD;  Location: [place];  Service: Vascular;  Laterality: Right;  . TEE WITHOUT CARDIOVERSION N/A 08/27/2013   Procedure: TRANSESOPHAGEAL ECHOCARDIOGRAM (TEE);  Surgeon: [name], MD;  Location: [facility] ENDOSCOPY;  Service: Cardiovascular;  Laterality: N/A;  . TOTAL ABDOMINAL HYSTERECTOMY W/ BILATERAL SALPINGOOPHORECTOMY  1997   fibroids  . TRANSTHORACIC ECHOCARDIOGRAM     03/10/15 echo ([facility]): LV cavity normal in size, focal basal hypertrophy, normal systolic function, EF 08% (visual est). Normal wall motion, no regional wall motion abnormalities. Mild diastolic dysfunction with normal LA chamber size. No significant valve stenosis or regurgitation.  . VULVA / PERINEUM BIOPSY  2015    Family History  Problem Relation Age of Onset  . Deep vein thrombosis Mother        post thyroid surgery  . Hypertension Mother   . Heart attack Father 39       deceased  . Hypertension Father   . Cancer Sister        breast  . Cancer Paternal Aunt        pancreatic  . Diabetes Maternal Grandfather   . Stroke Paternal Grandmother        in 40s  . Colon cancer Neg Hx   . Esophageal cancer Neg Hx   . Stomach cancer Neg Hx   . Rectal cancer Neg Hx      Current Outpatient Medications:  .  acetaminophen (TYLENOL) 500 MG tablet, Take 500-750 mg by mouth every 6 (six) hours as needed for pain., Disp: , Rfl:  .  amLODipine (NORVASC) 10 MG tablet, Take 1 tablet (10 mg total) by mouth daily., Disp: 30 tablet, Rfl: 0 .  aspirin EC 81 MG EC tablet, Take 1 tablet (81 mg total) by mouth daily., Disp: , Rfl:  .  AYR SALINE NASAL DROPS NA,  Place 2 sprays into both nostrils daily., Disp: , Rfl:  .  Camphor-Eucalyptus-Menthol (VICKS VAPORUB EX), Apply 1 application topically daily., Disp: , Rfl:  .  doxazosin (CARDURA) 2 MG tablet, Take 2 mg by mouth 2 (two) times daily. , Disp: , Rfl:  .  fluticasone (FLONASE) 50 MCG/ACT nasal spray, one spray by Both Nostrils route daily., Disp: , Rfl:  .  levothyroxine (SYNTHROID, LEVOTHROID) 125 MCG tablet, Take 1 tablet (125 mcg total) by mouth daily., Disp: 90 tablet, Rfl: 1 .  losartan (COZAAR) 50 MG tablet, Take 1 tablet (50 mg total) by mouth daily., Disp: 30 tablet, Rfl: 6 .  Magnesium Oxide 500 MG TABS, Take 1 tablet by mouth daily., Disp: , Rfl:  .  meclizine (ANTIVERT) 25 MG tablet, Take 1  tablet (25 mg total) by mouth 3 (three) times daily as needed for dizziness or nausea., Disp: 20 tablet, Rfl: 0 .  mycophenolate (MYFORTIC) 360 MG TBEC EC tablet, Take 360 mg by mouth 2 (two) times daily., Disp: , Rfl:  .  silver sulfADIAZINE (SILVADENE) 1 % cream, Apply 1 application topically daily. Mixes with Triamcinolone cream, Disp: , Rfl:  .  SODIUM BICARBONATE PO, Take 650 mg by mouth 2 (two) times daily. Takes 2 in AM and 2 at bedtime, Disp: , Rfl:  .  tacrolimus (PROGRAF) 1 MG capsule, Take 4 mg by mouth 2 (two) times daily. Pt takes 3 in the morning and 3 at night., Disp: , Rfl:  .  triamcinolone (NASACORT ALLERGY 24HR) 55 MCG/ACT AERO nasal inhaler, Place 2 sprays into the nose daily., Disp: , Rfl:  .  triamcinolone cream (KENALOG) 0.1 %, Apply 1 application topically daily., Disp: , Rfl:  .  verapamil (CALAN-SR) 120 MG CR tablet, 1 tab po every afternoon, Disp: 30 tablet, Rfl: 1 .  verapamil (CALAN-SR) 240 MG CR tablet, 1 tab po every morning, Disp: 90 tablet, Rfl: 1  EXAM:  VITALS per patient if applicable:  GENERAL: alert, oriented, appears well and in no acute distress  HEENT: atraumatic, conjunttiva clear, no obvious abnormalities on inspection of external nose and ears  NECK:  normal movements of the head and neck  LUNGS: on inspection no signs of respiratory distress, breathing rate appears normal, no obvious gross SOB, gasping or wheezing  CV: no obvious cyanosis  MS: moves all visible extremities without noticeable abnormality  PSYCH/NEURO: pleasant and cooperative, no obvious depression or anxiety, speech and thought processing grossly intact  LABS: none today  No results found for: VITAMINB12    Chemistry      Component Value Date/Time   NA 138 11/30/2018 0900   K 4.5 11/30/2018 0900   CL 105 11/30/2018 0900   CO2 25 11/30/2018 0900   BUN 22 11/30/2018 0900   CREATININE 1.08 11/30/2018 0900   CREATININE 3.61 (H) 09/16/2015 1515      Component Value Date/Time   CALCIUM 9.1 11/30/2018 0900   CALCIUM 8.4 08/30/2012 0819   ALKPHOS 71 12/14/2012 0415   AST 24 12/14/2012 0415   ALT 21 12/14/2012 0415   BILITOT 0.3 12/14/2012 0415     Lab Results  Component Value Date   WBC 5.5 09/16/2015   HGB 12.6 02/14/2017   HCT 37.0 02/14/2017   MCV 94.8 09/16/2015   PLT 179 09/16/2015   Lab Results  Component Value Date   TSH 4.36 11/14/2018    ASSESSMENT AND PLAN:  Discussed the following assessment and plan:  1) Hands and feet paresthesias; feet sx's are in a location not typical for PN (solely on metatarsal head regions and in arches). Sx's seem subacute, not severe.  NO motor sx's.  +Symmetric. For now, check vit B12-ordered future. May need to refer to neurology for consideration of NCS/EMG. Very low suspicion that this is a side effect of one of her meds (tacrolimus? Mycophenolate?) No treatment at this time since sx's are tolerable and would like to not cause difficulty in interpreting symptoms over the next weeks/months.  I discussed the assessment and treatment plan with the patient. The patient was provided an opportunity to ask questions and all were answered. The patient agreed with the plan and demonstrated an understanding of the  instructions.   The patient was advised to call back or seek an in-person evaluation  if the symptoms worsen or if the condition fails to improve as anticipated.  F/u: 2-3 wks  Signed:  Crissie Sickles, MD           12/07/2018

## 2018-12-10 ENCOUNTER — Encounter: Payer: Self-pay | Admitting: Family Medicine

## 2018-12-10 ENCOUNTER — Other Ambulatory Visit (INDEPENDENT_AMBULATORY_CARE_PROVIDER_SITE_OTHER): Payer: Medicare Other

## 2018-12-10 ENCOUNTER — Other Ambulatory Visit: Payer: Self-pay | Admitting: Family Medicine

## 2018-12-10 ENCOUNTER — Other Ambulatory Visit: Payer: Self-pay

## 2018-12-10 DIAGNOSIS — G609 Hereditary and idiopathic neuropathy, unspecified: Secondary | ICD-10-CM | POA: Diagnosis not present

## 2018-12-10 DIAGNOSIS — R7989 Other specified abnormal findings of blood chemistry: Secondary | ICD-10-CM

## 2018-12-10 DIAGNOSIS — R209 Unspecified disturbances of skin sensation: Secondary | ICD-10-CM | POA: Diagnosis not present

## 2018-12-10 DIAGNOSIS — IMO0001 Reserved for inherently not codable concepts without codable children: Secondary | ICD-10-CM

## 2018-12-10 LAB — BASIC METABOLIC PANEL
BUN: 16 mg/dL (ref 6–23)
CO2: 23 mEq/L (ref 19–32)
Calcium: 9.1 mg/dL (ref 8.4–10.5)
Chloride: 107 mEq/L (ref 96–112)
Creatinine, Ser: 1.01 mg/dL (ref 0.40–1.20)
GFR: 53.69 mL/min — ABNORMAL LOW (ref >60.00)
Glucose, Bld: 104 mg/dL — ABNORMAL HIGH (ref 70–99)
Potassium: 4.4 mEq/L (ref 3.5–5.1)
Sodium: 139 mEq/L (ref 135–145)

## 2018-12-10 LAB — VITAMIN B12: Vitamin B-12: 184 pg/mL — ABNORMAL LOW (ref 211–911)

## 2018-12-10 LAB — MAGNESIUM: Magnesium: 1.5 mg/dL (ref 1.5–2.5)

## 2018-12-10 MED ORDER — CYANOCOBALAMIN 1000 MCG/ML IJ SOLN: INTRAMUSCULAR | 0 refills | Status: DC

## 2018-12-13 LAB — METHYLMALONIC ACID, SERUM: Methylmalonic Acid, Quant: 366 nmol/L — ABNORMAL HIGH (ref 87–318)

## 2018-12-17 ENCOUNTER — Other Ambulatory Visit: Payer: Self-pay

## 2018-12-17 ENCOUNTER — Ambulatory Visit (INDEPENDENT_AMBULATORY_CARE_PROVIDER_SITE_OTHER): Payer: Medicare Other | Admitting: Family Medicine

## 2018-12-17 DIAGNOSIS — E538 Deficiency of other specified B group vitamins: Secondary | ICD-10-CM | POA: Diagnosis not present

## 2018-12-17 MED ORDER — VERAPAMIL HCL ER 120 MG PO TBCR: EXTENDED_RELEASE_TABLET | ORAL | 0 refills | Status: DC

## 2018-12-17 MED ORDER — CYANOCOBALAMIN 1000 MCG/ML IJ SOLN
1000.0000 ug | Freq: Once | INTRAMUSCULAR | Status: AC
  Administered 2018-12-17: 1000 ug via INTRAMUSCULAR

## 2018-12-17 NOTE — Progress Notes (Signed)
Tabitha Green is a 73 y.o. female presents to the office today for Vitamin B12 injections, per physician's orders.  Original order: b12 injection every day for 1 week, then every week for 4 weeks. About 1 week after her 4th weekly injection I'll check another B12 level and decide on a long term injection schedule after that  Vitamin B12 , 1075mcg, IM was administered left deltoid today. Patient tolerated injection.   Patient next injection due: 5.12.2020, appt made Yes  [name],CMA

## 2018-12-17 NOTE — Progress Notes (Signed)
Pt with vit B12 deficiency.  Agree with vit B12 1000 mcg IM in office today. Signed:  Crissie Sickles, MD           12/17/2018

## 2018-12-18 ENCOUNTER — Ambulatory Visit (INDEPENDENT_AMBULATORY_CARE_PROVIDER_SITE_OTHER): Payer: Medicare Other

## 2018-12-18 DIAGNOSIS — E538 Deficiency of other specified B group vitamins: Secondary | ICD-10-CM | POA: Diagnosis not present

## 2018-12-18 MED ORDER — CYANOCOBALAMIN 1000 MCG/ML IJ SOLN
1000.0000 ug | Freq: Once | INTRAMUSCULAR | Status: AC
  Administered 2018-12-18: 1000 ug via INTRAMUSCULAR

## 2018-12-18 NOTE — Progress Notes (Signed)
Tabitha Green is a 73 y.o. female presents to the office today for Vitamin B12  injections, per physician's orders. Original order: B12 injection every day for 1 week, then every week for 4 weeks. About 1 week after her 4th weekly injection I'll check another B12 level and decide on a long term injection schedule after that Cyanocobalamin 1066mcg, IM was administered in right deltoid today. Patient tolerated injection. Patient next injection due: 12/19/2018, appt made Yes  Gerilyn Nestle

## 2018-12-19 ENCOUNTER — Ambulatory Visit (INDEPENDENT_AMBULATORY_CARE_PROVIDER_SITE_OTHER): Payer: Medicare Other

## 2018-12-19 ENCOUNTER — Other Ambulatory Visit: Payer: Self-pay

## 2018-12-19 DIAGNOSIS — E538 Deficiency of other specified B group vitamins: Secondary | ICD-10-CM | POA: Diagnosis not present

## 2018-12-19 MED ORDER — CYANOCOBALAMIN 1000 MCG/ML IJ SOLN
1000.0000 ug | Freq: Once | INTRAMUSCULAR | Status: AC
  Administered 2018-12-19: 1000 ug via INTRAMUSCULAR

## 2018-12-19 NOTE — Progress Notes (Signed)
Tabitha Green is a 73 y.o. female presents to the office today for Vitamin B12 injections, per physician's orders. Original order: B12 injection every day for 1 week, then every week for 4 weeks. About 1 week after her 4th weekly injection I'll check another B12 level and decide on a long term injection schedule after that Cyanocobalamin 1021mcg, IM was administered in left deltoid today. Patient tolerated injection. Patient next injection due: 12/20/2018, appt made Yes  Gerilyn Nestle

## 2018-12-20 ENCOUNTER — Encounter: Payer: Self-pay | Admitting: Family Medicine

## 2018-12-20 ENCOUNTER — Ambulatory Visit: Payer: Self-pay | Admitting: *Deleted

## 2018-12-20 ENCOUNTER — Ambulatory Visit (INDEPENDENT_AMBULATORY_CARE_PROVIDER_SITE_OTHER): Payer: Medicare Other

## 2018-12-20 ENCOUNTER — Ambulatory Visit (INDEPENDENT_AMBULATORY_CARE_PROVIDER_SITE_OTHER): Payer: Medicare Other | Admitting: Family Medicine

## 2018-12-20 VITALS — BP 152/99 | HR 77

## 2018-12-20 VITALS — BP 130/83 | HR 76 | Ht 65.0 in | Wt 246.0 lb

## 2018-12-20 DIAGNOSIS — R030 Elevated blood-pressure reading, without diagnosis of hypertension: Secondary | ICD-10-CM

## 2018-12-20 DIAGNOSIS — E538 Deficiency of other specified B group vitamins: Secondary | ICD-10-CM | POA: Diagnosis not present

## 2018-12-20 DIAGNOSIS — I10 Essential (primary) hypertension: Secondary | ICD-10-CM

## 2018-12-20 MED ORDER — LORAZEPAM 0.5 MG PO TABS: 0.5000 mg | ORAL_TABLET | Freq: Two times a day (BID) | ORAL | 1 refills | Status: DC | PRN

## 2018-12-20 MED ORDER — CYANOCOBALAMIN 1000 MCG/ML IJ SOLN
1000.0000 ug | Freq: Once | INTRAMUSCULAR | Status: AC
  Administered 2018-12-20: 1000 ug via INTRAMUSCULAR

## 2018-12-20 NOTE — Telephone Encounter (Signed)
Pt called with having an elevated b/p this morning. She skipped taking a b/p medication a couple of nights ago and now her readings are 170/99 and 178/108. She stated she felt some weakness when she was getting out of the bath tub and has had some stinging between her breast. She denies headache, blurred vision or numbness or tingling.  She has an appointment this morning for a vitamin B12 injection and wanted to know if she could see her provider at that time. Advised patient that we would have to check with the scheduler to decide an appointment, it could be virtual. Pt voiced understanding and will go to the ED for increase in symptoms. LB at [facility] notified regarding an appointment. Call conference in with patient. Routing to flow at [facility] at [facility] for review.  Reason for Disposition . Systolic BP  >= 665 OR Diastolic >= 993  Answer Assessment - Initial Assessment Questions 1. BLOOD PRESSURE: "What is the blood pressure?" "Did you take at least two measurements 5 minutes apart?"     170/99 and 178/108 2. ONSET: "When did you take your blood pressure?"     This morning 3. HOW: "How did you obtain the blood pressure?" (e.g., visiting nurse, automatic home BP monitor)      Automatic home BP monitor 4. HISTORY: "Do you have a history of high blood pressure?"     yes 5. MEDICATIONS: "Are you taking any medications for blood pressure?" "Have you missed any doses recently?"     On medication and missed one dose the night before 6. OTHER SYMPTOMS: "Do you have any symptoms?" (e.g., headache, chest pain, blurred vision, difficulty breathing, weakness)     Some weakness just for a minute, stinging between breasts,  7. PREGNANCY: "Is there any chance you are pregnant?" "When was your last menstrual period?"     n/a  Protocols used: HIGH BLOOD PRESSURE-A-AH

## 2018-12-20 NOTE — Progress Notes (Signed)
Pt with vit B12 deficiency.  Agree with vit B12 1000 mcg IM in office today. Signed:  Crissie Sickles, MD           12/20/2018

## 2018-12-20 NOTE — Telephone Encounter (Signed)
Pt has been scheduled for today

## 2018-12-20 NOTE — Progress Notes (Signed)
Virtual Visit via Video Note  I connected with pt on 12/20/18 at  9:40 AM EDT by a video enabled telemedicine application and verified that I am speaking with the correct person using two identifiers.  Location patient: home Location provider:work or home office Persons participating in the virtual visit: patient, provider  I discussed the limitations of evaluation and management by telemedicine and the availability of in person appointments. The patient expressed understanding and agreed to proceed.  Telemedicine visit is a necessity given the COVID-19 restrictions in place at the current time.  HPI: [name] being seen today for elevated blood pressure at home. BP was 170/100s at home today--she just decided to check it on a whim.  She has no usual schedule of checking her bp at home. Currently 152/99, HR 77. Most recent sCr was 1.01 and K 4.4 on 12/10/2018.   132/76, 113/68, 117/71, 136/86 (HR 65) , 145/96, (HR 70)-->these measurements are all from last week.  ROS: she denies HA, vision c/o, CP, SOB, dizziness, or focal or generalized weakness. She is anxious, gets more anxious every time she rechecks her bp. She is significantly more anxious the last 2 mo since following stay-at-home orders for covid 19, sits and thinks about things/perseverates on health probs, etc. She is not on any meds for anxiety. No acute pain,  No recent new otc meds. She did just get dx of vit b12 def and has started B12 injections.  Past Medical History:  Diagnosis Date  . Anemia of chronic kidney failure    r/t kidney function- receives Procrit  . Anxiety   . Chronic renal insufficiency, stage III (moderate) (HCC) 10/2015   Post-transplant Cr 1.3, GFR 41 as of June 2017 f/u w/ [facility]  . CMV infection [facility]) summer 2017   Valcyte per ID/Renal transplant team  . Diverticulosis    a. 05/2012 colonoscopy  . Erosive lichen planus of vulva    Topical steroids (managed by Dr. Mila Palmer  [facility].  . ESRD (end stage renal disease) ([place])    began dialysis 2014--followed by Dr. [name].  Received deceased donor kidney transplant 10/2015.  [name] FSGS (focal segmental glomerulosclerosis)    right kidney; hx of left renal cell cancer and got nephrectomy 1993.  [name] GERD (gastroesophageal reflux disease)   . Gout   . Heart murmur    (Diastolic) ECHO 02/6282 showed that this murmur is coming from pt's R arm AV fistula  . Hiatal hernia   . History of renal cell cancer 1993   Left nephrectomy  . Hyperlipidemia   . Hypertension   . Hypothyroidism   . Impingement syndrome of left shoulder 04/2016   Dr. Christy Sartorius to PT  . Metatarsal fracture, pathologic 01/2018   Right; orthocarolina-->post op shoe continued, f/u x-ray planned.  . Osteoarthritis of left knee 08/2017   severe, diffuse, tricompartmental.  Responded to steroid injection.    . Osteoporosis 06/07/2016   DEXA T-score of -3.1.  Fosamax planned 2017 but dental work prevented start..  Pathologic toe fx 12/2017--repeat DEXA 08/2018, T-score -3.3 radius.  . Renal transplant recipient 11/06/2015   Baseline Cr as of 02/2018= 1.0-1.2.  . SVT (supraventricular tachycardia) ([place])   . Vitamin B12 deficiency 12/2018   Starting replacement as of 12/11/2018    Past Surgical History:  Procedure Laterality Date  . AV FISTULA PLACEMENT Right    Right arm: aneurismal dilatation 2017 being followed by CV surgeons  .  CARDIOVASCULAR STRESS TEST     03/10/15 ETT ([facility]): Exercise ECG negative at 83% max predicted HR.   [name] COLONOSCOPY  2003; 05/2012   diverticulosis, no polyps.  Recall 10 yrs  . colonoscopy with polypectomy     Dr Henrene Pastor  . DEXA  06/07/2016; 08/22/2018   2017 T-score -3.1.  2020 T score -3.3  . KIDNEY TRANSPLANT  11-09-15   Deceased donor kidney transplant, with Thymoglobulin induction  . LIGATION OF ARTERIOVENOUS  FISTULA Right 02/14/2017   Procedure: LIGATION OF  ARTERIOVENOUS  FISTULA;  Surgeon: Angelia Mould, MD;  Location: [place];  Service: Vascular;  Laterality: Right;  . NEPHRECTOMY  1993   for malignancy- left  . RENAL BIOPSY     right  . RESECTION OF ARTERIOVENOUS FISTULA ANEURYSM Right 02/14/2017   Procedure: EXCISION OF RIGHT BRACHIOCEPHALIC ARTERIOVENOUS FISTULA ANEURYSM;  Surgeon: Angelia Mould, MD;  Location: [place];  Service: Vascular;  Laterality: Right;  . TEE WITHOUT CARDIOVERSION N/A 08/27/2013   Procedure: TRANSESOPHAGEAL ECHOCARDIOGRAM (TEE);  Surgeon: [name], MD;  Location: [facility] ENDOSCOPY;  Service: Cardiovascular;  Laterality: N/A;  . TOTAL ABDOMINAL HYSTERECTOMY W/ BILATERAL SALPINGOOPHORECTOMY  1997   fibroids  . TRANSTHORACIC ECHOCARDIOGRAM     03/10/15 echo ([facility]): LV cavity normal in size, focal basal hypertrophy, normal systolic function, EF 46% (visual est). Normal wall motion, no regional wall motion abnormalities. Mild diastolic dysfunction with normal LA chamber size. No significant valve stenosis or regurgitation.  . VULVA / PERINEUM BIOPSY  2015    Family History  Problem Relation Age of Onset  . Deep vein thrombosis Mother        post thyroid surgery  . Hypertension Mother   . Heart attack Father 51       deceased  . Hypertension Father   . Cancer Sister        breast  . Cancer Paternal Aunt        pancreatic  . Diabetes Maternal Grandfather   . Stroke Paternal Grandmother        in 6s  . Colon cancer Neg Hx   . Esophageal cancer Neg Hx   . Stomach cancer Neg Hx   . Rectal cancer Neg Hx      Current Outpatient Medications:  .  acetaminophen (TYLENOL) 500 MG tablet, Take 500-750 mg by mouth every 6 (six) hours as needed for pain., Disp: , Rfl:  .  amLODipine (NORVASC) 10 MG tablet, Take 1 tablet (10 mg total) by mouth daily., Disp: 30 tablet, Rfl: 0 .  aspirin EC 81 MG EC tablet, Take 1 tablet (81 mg total) by mouth daily., Disp: , Rfl:  .  AYR SALINE NASAL  DROPS NA, Place 2 sprays into both nostrils daily., Disp: , Rfl:  .  Camphor-Eucalyptus-Menthol (VICKS VAPORUB EX), Apply 1 application topically daily., Disp: , Rfl:  .  cyanocobalamin (,VITAMIN B-12,) 1000 MCG/ML injection, 1 ml IM qd x 7d, then 1 ml IM q7d x 4, Disp: 11 mL, Rfl: 0 .  doxazosin (CARDURA) 2 MG tablet, Take 2 mg by mouth 2 (two) times daily. , Disp: , Rfl:  .  fluticasone (FLONASE) 50 MCG/ACT nasal spray, one spray by Both Nostrils route daily., Disp: , Rfl:  .  levothyroxine (SYNTHROID, LEVOTHROID) 125 MCG tablet, Take 1 tablet (125 mcg total) by mouth daily., Disp: 90 tablet, Rfl: 1 .  losartan (COZAAR) 50 MG tablet, Take 1 tablet (50 mg total) by  mouth daily., Disp: 30 tablet, Rfl: 6 .  Magnesium Oxide 500 MG TABS, Take 1 tablet by mouth daily., Disp: , Rfl:  .  meclizine (ANTIVERT) 25 MG tablet, Take 1 tablet (25 mg total) by mouth 3 (three) times daily as needed for dizziness or nausea., Disp: 20 tablet, Rfl: 0 .  mycophenolate (MYFORTIC) 360 MG TBEC EC tablet, Take 360 mg by mouth 2 (two) times daily., Disp: , Rfl:  .  silver sulfADIAZINE (SILVADENE) 1 % cream, Apply 1 application topically daily. Mixes with Triamcinolone cream, Disp: , Rfl:  .  SODIUM BICARBONATE PO, Take 650 mg by mouth 2 (two) times daily. Takes 2 in AM and 2 at bedtime, Disp: , Rfl:  .  tacrolimus (PROGRAF) 1 MG capsule, Take 4 mg by mouth 2 (two) times daily. Pt takes 3 in the morning and 3 at night., Disp: , Rfl:  .  triamcinolone (NASACORT ALLERGY 24HR) 55 MCG/ACT AERO nasal inhaler, Place 2 sprays into the nose daily., Disp: , Rfl:  .  triamcinolone cream (KENALOG) 0.1 %, Apply 1 application topically daily., Disp: , Rfl:  .  verapamil (CALAN-SR) 120 MG CR tablet, 1 tab po every afternoon, Disp: 90 tablet, Rfl: 0 .  verapamil (CALAN-SR) 240 MG CR tablet, 1 tab po every morning, Disp: 90 tablet, Rfl: 1  EXAM:  VITALS per patient if applicable: BP (!) 675/91 (BP Location: Left Arm, Patient Position:  Sitting, Cuff Size: Normal)   Pulse 77    GENERAL: alert, oriented, appears well and in no acute distress  HEENT: atraumatic, conjunttiva clear, no obvious abnormalities on inspection of external nose and ears  NECK: normal movements of the head and neck  LUNGS: on inspection no signs of respiratory distress, breathing rate appears normal, no obvious gross SOB, gasping or wheezing  CV: no obvious cyanosis  MS: moves all visible extremities without noticeable abnormality  PSYCH/NEURO: pleasant and cooperative, no obvious depression or anxiety, speech and thought processing grossly intact  LABS: none today    Chemistry      Component Value Date/Time   NA 139 12/10/2018 0910   K 4.4 12/10/2018 0910   CL 107 12/10/2018 0910   CO2 23 12/10/2018 0910   BUN 16 12/10/2018 0910   CREATININE 1.01 12/10/2018 0910   CREATININE 3.61 (H) 09/16/2015 1515      Component Value Date/Time   CALCIUM 9.1 12/10/2018 0910   CALCIUM 8.4 08/30/2012 0819   ALKPHOS 71 12/14/2012 0415   AST 24 12/14/2012 0415   ALT 21 12/14/2012 0415   BILITOT 0.3 12/14/2012 0415      ASSESSMENT AND PLAN:  Discussed the following assessment and plan:  1) Elevated bp's in pt with HTN and CRI III (is s/p renal transplant). Not clear as to why. Unclear how much her increased anxiety lately is attributing to this. Seeing a high bp number definitely makes her anxiety increase.   Doubt recent start of IM vit B12 is doing this. Will proceed cautiously with up titration of bp med: will increase losartan to 100 mg qd, have her check bp morning and evening + HRs. Will also get her started on ativan 0.5mg  bid prn.  Therapeutic expectations and side effect profile of medication discussed today.  Patient's questions answered.   I discussed the assessment and treatment plan with the patient. The patient was provided an opportunity to ask questions and all were answered. The patient agreed with the plan and demonstrated an  understanding of the instructions.  The patient was advised to call back or seek an in-person evaluation if the symptoms worsen or if the condition fails to improve as anticipated.  F/u: keep appt scheduled for 12/24/18.    Signed:  Crissie Sickles, MD           12/20/2018

## 2018-12-20 NOTE — Progress Notes (Signed)
ALEYDA GINDLESPERGER is a 73 y.o. female presents to the office today for Vitamin B12 injections, per physician's orders. Original order: B12 injection every day for 1 week, then every week for 4 weeks. About 1 week after her 4th weekly injection I'll check another B12 level and decide on a long term injection schedule after that Cyanocobalamin 1020mcg, IMwas administeredin right deltoidtoday. Patient tolerated injection. Patient next injection due: 12/21/2018, appt made Yes  Acacia Latorre D Reanna Scoggin  LADELLE TEODORO is a 73 y.o. female presents to the office today for Blood pressure recheck secondary to 170/99, 152/99 elevated BP in office, Blood pressure medication: Amlodipine 10mg  qd, Verapamil 120 and 240mg , Losartan 50mg  qd. If on medication, Last dose was at least 1-2 hours prior to recheck: Yes Blood pressure was taken in the Left arm after patient rested for 10 minutes. Blood pressure 130/83, pulse 76.   Emilee Hero

## 2018-12-21 ENCOUNTER — Other Ambulatory Visit: Payer: Self-pay

## 2018-12-21 ENCOUNTER — Ambulatory Visit (INDEPENDENT_AMBULATORY_CARE_PROVIDER_SITE_OTHER): Payer: Medicare Other | Admitting: Family Medicine

## 2018-12-21 DIAGNOSIS — E538 Deficiency of other specified B group vitamins: Secondary | ICD-10-CM

## 2018-12-21 MED ORDER — CYANOCOBALAMIN 1000 MCG/ML IJ SOLN
1000.0000 ug | Freq: Once | INTRAMUSCULAR | Status: AC
  Administered 2018-12-21: 1000 ug via INTRAMUSCULAR

## 2018-12-21 NOTE — Progress Notes (Signed)
Tabitha Green is a 73 y.o. female presents to the office today for Vitamin B12 injections, per physician's orders. Original order: 12/10/2018 - b12 injection every day for 1 week, then every week for 4 weeks. About 1 week after her 4th weekly injection I'll check another B12 level  Vitamin B12, 1071mcg, IM was administered left deltoid today. Patient tolerated injection.  Patient next injection due: 1 week, appt made Yes  Kathie Dike., CMA

## 2018-12-24 ENCOUNTER — Ambulatory Visit (INDEPENDENT_AMBULATORY_CARE_PROVIDER_SITE_OTHER): Payer: Medicare Other | Admitting: Family Medicine

## 2018-12-24 ENCOUNTER — Encounter: Payer: Self-pay | Admitting: Family Medicine

## 2018-12-24 VITALS — BP 131/76 | HR 81

## 2018-12-24 DIAGNOSIS — E538 Deficiency of other specified B group vitamins: Secondary | ICD-10-CM | POA: Diagnosis not present

## 2018-12-24 DIAGNOSIS — R202 Paresthesia of skin: Secondary | ICD-10-CM | POA: Diagnosis not present

## 2018-12-24 DIAGNOSIS — I10 Essential (primary) hypertension: Secondary | ICD-10-CM

## 2018-12-24 DIAGNOSIS — F4322 Adjustment disorder with anxiety: Secondary | ICD-10-CM | POA: Diagnosis not present

## 2018-12-24 NOTE — Progress Notes (Signed)
Virtual Visit via Video Note  I connected with pt on 12/24/18 at 11:00 AM EDT by a video enabled telemedicine application and verified that I am speaking with the correct person using two identifiers.  Location patient: home Location provider:work or home office Persons participating in the virtual visit: patient, provider  I discussed the limitations of evaluation and management by telemedicine and the availability of in person appointments. The patient expressed understanding and agreed to proceed.  Telemedicine visit is a necessity given the COVID-19 restrictions in place at the current time.  HPI: [name] being seen today for f/u anxiety, recent dx of vit B12 def (has bilat LL paresthesias), and elevated bp's in pt with HTN and CRI III (is s/p renal transplant). Four days ago I increased her losartan to 100 mg qd and started her on lorazepam 0.5mg  bid prn anxiety. I started her on vit B12 injections on 12/17/18--she has had 5 daily injections of 1000 mcg qd now.  HTN: Home bp data since last visit--> 150/86, 70, 140/82, 80 135-140/70s, 152/96, 65, 117/76 147/79, 74.  147/79, 79. Now it is 131/76 NO probs since getting on increased losartan dose.  Paresthesias/vit B12 def:  Not having any paresthesias in arms or legs since getting on b12 injections.  Anxiety: she has not picked up the lorazepam yet.  She is definitely still wound tight/anxious still.  No panic. No depressed mood.  ROS: See pertinent positives and negatives per HPI.  Past Medical History:  Diagnosis Date  . Anemia of chronic kidney failure    r/t kidney function- receives Procrit  . Anxiety   . Chronic renal insufficiency, stage III (moderate) (HCC) 10/2015   Post-transplant Cr 1.3, GFR 41 as of June 2017 f/u w/ [facility]  . CMV infection [facility]) summer 2017   Valcyte per ID/Renal transplant team  . Diverticulosis    a. 05/2012 colonoscopy  . Erosive lichen planus of vulva    Topical  steroids (managed by Dr. Mila Palmer [facility] via [facility].  . ESRD (end stage renal disease) ([place])    began dialysis 2014--followed by Dr. [name].  Received deceased donor kidney transplant 10/2015.  [name] FSGS (focal segmental glomerulosclerosis)    right kidney; hx of left renal cell cancer and got nephrectomy 1993.  [name] GERD (gastroesophageal reflux disease)   . Gout   . Heart murmur    (Diastolic) ECHO 04/3266 showed that this murmur is coming from pt's R arm AV fistula  . Hiatal hernia   . History of renal cell cancer 1993   Left nephrectomy  . Hyperlipidemia   . Hypertension   . Hypothyroidism   . Impingement syndrome of left shoulder 04/2016   Dr. Christy Sartorius to PT  . Metatarsal fracture, pathologic 01/2018   Right; orthocarolina-->post op shoe continued, f/u x-ray planned.  . Osteoarthritis of left knee 08/2017   severe, diffuse, tricompartmental.  Responded to steroid injection.    . Osteoporosis 06/07/2016   DEXA T-score of -3.1.  Fosamax planned 2017 but dental work prevented start..  Pathologic toe fx 12/2017--repeat DEXA 08/2018, T-score -3.3 radius.  . Renal transplant recipient 11/06/2015   Baseline Cr as of 02/2018= 1.0-1.2.  . SVT (supraventricular tachycardia) ([place])   . Vitamin B12 deficiency 12/2018   Starting replacement as of 12/11/2018    Past Surgical History:  Procedure Laterality Date  . AV FISTULA PLACEMENT Right    Right arm: aneurismal dilatation 2017 being followed by CV surgeons  .  CARDIOVASCULAR STRESS TEST     03/10/15 ETT ([facility]): Exercise ECG negative at 83% max predicted HR.   [name] COLONOSCOPY  2003; 05/2012   diverticulosis, no polyps.  Recall 10 yrs  . colonoscopy with polypectomy     Dr Henrene Pastor  . DEXA  06/07/2016; 08/22/2018   2017 T-score -3.1.  2020 T score -3.3  . KIDNEY TRANSPLANT  12-Nov-2015   Deceased donor kidney transplant, with Thymoglobulin induction  . LIGATION OF ARTERIOVENOUS  FISTULA Right 02/14/2017    Procedure: LIGATION OF ARTERIOVENOUS  FISTULA;  Surgeon: Angelia Mould, MD;  Location: [place];  Service: Vascular;  Laterality: Right;  . NEPHRECTOMY  1993   for malignancy- left  . RENAL BIOPSY     right  . RESECTION OF ARTERIOVENOUS FISTULA ANEURYSM Right 02/14/2017   Procedure: EXCISION OF RIGHT BRACHIOCEPHALIC ARTERIOVENOUS FISTULA ANEURYSM;  Surgeon: Angelia Mould, MD;  Location: [place];  Service: Vascular;  Laterality: Right;  . TEE WITHOUT CARDIOVERSION N/A 08/27/2013   Procedure: TRANSESOPHAGEAL ECHOCARDIOGRAM (TEE);  Surgeon: [name], MD;  Location: [facility] ENDOSCOPY;  Service: Cardiovascular;  Laterality: N/A;  . TOTAL ABDOMINAL HYSTERECTOMY W/ BILATERAL SALPINGOOPHORECTOMY  1997   fibroids  . TRANSTHORACIC ECHOCARDIOGRAM     03/10/15 echo ([facility]): LV cavity normal in size, focal basal hypertrophy, normal systolic function, EF 26% (visual est). Normal wall motion, no regional wall motion abnormalities. Mild diastolic dysfunction with normal LA chamber size. No significant valve stenosis or regurgitation.  . VULVA / PERINEUM BIOPSY  2015    Family History  Problem Relation Age of Onset  . Deep vein thrombosis Mother        post thyroid surgery  . Hypertension Mother   . Heart attack Father 23       deceased  . Hypertension Father   . Cancer Sister        breast  . Cancer Paternal Aunt        pancreatic  . Diabetes Maternal Grandfather   . Stroke Paternal Grandmother        in 26s  . Colon cancer Neg Hx   . Esophageal cancer Neg Hx   . Stomach cancer Neg Hx   . Rectal cancer Neg Hx      Current Outpatient Medications:  .  acetaminophen (TYLENOL) 500 MG tablet, Take 500-750 mg by mouth every 6 (six) hours as needed for pain., Disp: , Rfl:  .  amLODipine (NORVASC) 10 MG tablet, Take 1 tablet (10 mg total) by mouth daily., Disp: 30 tablet, Rfl: 0 .  aspirin EC 81 MG EC tablet, Take 1 tablet (81 mg total) by mouth daily., Disp: ,  Rfl:  .  AYR SALINE NASAL DROPS NA, Place 2 sprays into both nostrils daily., Disp: , Rfl:  .  Camphor-Eucalyptus-Menthol (VICKS VAPORUB EX), Apply 1 application topically daily., Disp: , Rfl:  .  cyanocobalamin (,VITAMIN B-12,) 1000 MCG/ML injection, 1 ml IM qd x 7d, then 1 ml IM q7d x 4, Disp: 11 mL, Rfl: 0 .  doxazosin (CARDURA) 2 MG tablet, Take 2 mg by mouth 2 (two) times daily. , Disp: , Rfl:  .  fluticasone (FLONASE) 50 MCG/ACT nasal spray, one spray by Both Nostrils route daily., Disp: , Rfl:  .  levothyroxine (SYNTHROID, LEVOTHROID) 125 MCG tablet, Take 1 tablet (125 mcg total) by mouth daily., Disp: 90 tablet, Rfl: 1 .  losartan (COZAAR) 50 MG tablet, Take 1 tablet (50 mg total) by  mouth daily., Disp: 30 tablet, Rfl: 6 .  Magnesium Oxide 500 MG TABS, Take 1 tablet by mouth daily., Disp: , Rfl:  .  meclizine (ANTIVERT) 25 MG tablet, Take 1 tablet (25 mg total) by mouth 3 (three) times daily as needed for dizziness or nausea., Disp: 20 tablet, Rfl: 0 .  mycophenolate (MYFORTIC) 360 MG TBEC EC tablet, Take 360 mg by mouth 2 (two) times daily., Disp: , Rfl:  .  silver sulfADIAZINE (SILVADENE) 1 % cream, Apply 1 application topically daily. Mixes with Triamcinolone cream, Disp: , Rfl:  .  SODIUM BICARBONATE PO, Take 650 mg by mouth 2 (two) times daily. Takes 2 in AM and 2 at bedtime, Disp: , Rfl:  .  tacrolimus (PROGRAF) 1 MG capsule, Take 4 mg by mouth 2 (two) times daily. Pt takes 3 in the morning and 3 at night., Disp: , Rfl:  .  triamcinolone (NASACORT ALLERGY 24HR) 55 MCG/ACT AERO nasal inhaler, Place 2 sprays into the nose daily., Disp: , Rfl:  .  triamcinolone cream (KENALOG) 0.1 %, Apply 1 application topically daily., Disp: , Rfl:  .  verapamil (CALAN-SR) 120 MG CR tablet, 1 tab po every afternoon, Disp: 90 tablet, Rfl: 0 .  verapamil (CALAN-SR) 240 MG CR tablet, 1 tab po every morning, Disp: 90 tablet, Rfl: 1 .  LORazepam (ATIVAN) 0.5 MG tablet, Take 1 tablet (0.5 mg total) by mouth  2 (two) times daily as needed for anxiety. (Patient not taking: Reported on 12/24/2018), Disp: 30 tablet, Rfl: 1  EXAM:  VITALS per patient if applicable: BP 518/84 (BP Location: Left Arm, Patient Position: Sitting, Cuff Size: Normal)   Pulse 81    GENERAL: alert, oriented, appears well and in no acute distress  HEENT: atraumatic, conjunttiva clear, no obvious abnormalities on inspection of external nose and ears  NECK: normal movements of the head and neck  LUNGS: on inspection no signs of respiratory distress, breathing rate appears normal, no obvious gross SOB, gasping or wheezing  CV: no obvious cyanosis  MS: moves all visible extremities without noticeable abnormality  PSYCH/NEURO: pleasant and cooperative, no obvious depression or anxiety, speech and thought processing grossly intact  LABS: none today   Chemistry      Component Value Date/Time   NA 139 12/10/2018 0910   K 4.4 12/10/2018 0910   CL 107 12/10/2018 0910   CO2 23 12/10/2018 0910   BUN 16 12/10/2018 0910   CREATININE 1.01 12/10/2018 0910   CREATININE 3.61 (H) 09/16/2015 1515      Component Value Date/Time   CALCIUM 9.1 12/10/2018 0910   CALCIUM 8.4 08/30/2012 0819   ALKPHOS 71 12/14/2012 0415   AST 24 12/14/2012 0415   ALT 21 12/14/2012 0415   BILITOT 0.3 12/14/2012 0415     Lab Results  Component Value Date   VITAMINB12 184 (L) 12/10/2018   Lab Results  Component Value Date   WBC 5.5 09/16/2015   HGB 12.6 02/14/2017   HCT 37.0 02/14/2017   MCV 94.8 09/16/2015   PLT 179 09/16/2015    ASSESSMENT AND PLAN:  Discussed the following assessment and plan:  1) HTN, not ideal control but improved some since increasing losartan from 50mg  qd to 100 qd. We'll hold off on further bp med changes at this time and she'll pick up her alprazolam and we'll see how bp goes after calming some of her anxiety down.  2) Paresthesias, dx'd with vit B12 def recently: tolerating vit B12 shots well, paresthesias  not occurring anymore.  3) Adjustment d/o with anxious mood, superimposed on GAD. She will pick up the lorazepam today.  Therapeutic expectations and side effect profile of medication discussed today.  Patient's questions answered.  I discussed the assessment and treatment plan with the patient. The patient was provided an opportunity to ask questions and all were answered. The patient agreed with the plan and demonstrated an understanding of the instructions.   The patient was advised to call back or seek an in-person evaluation if the symptoms worsen or if the condition fails to improve as anticipated.  F/u: 2 wks HTN/anxiety.  She will be visiting some relatives out of town this coming w/e so hopefully this will help her feel better overall.  Signed:  Crissie Sickles, MD           12/24/2018

## 2018-12-27 ENCOUNTER — Ambulatory Visit (INDEPENDENT_AMBULATORY_CARE_PROVIDER_SITE_OTHER): Payer: Medicare Other

## 2018-12-27 DIAGNOSIS — E538 Deficiency of other specified B group vitamins: Secondary | ICD-10-CM | POA: Diagnosis not present

## 2018-12-27 MED ORDER — CYANOCOBALAMIN 1000 MCG/ML IJ SOLN
1000.0000 ug | Freq: Once | INTRAMUSCULAR | Status: AC
  Administered 2018-12-27: 1000 ug via INTRAMUSCULAR

## 2018-12-27 NOTE — Progress Notes (Signed)
Pt with vit B12 deficiency.  Agree with vit B12 1000 mcg IM in office today. Signed:  Crissie Sickles, MD           12/27/2018

## 2018-12-27 NOTE — Progress Notes (Signed)
Tabitha Green is a 73 y.o. female presents to the office today for Vit B12 injections, per physician's orders. Original order: 12/10/2018 - b12 injection every day for 1 week, then every week for 4 weeks. About 1 week after her 4th weekly injection I'll check another B12 level  Vitamin B12, 1021mcg, IM was administered left deltoid today. Patient tolerated injection. Patient due for follow up labs/provider appt: No. Date due: 1 week after 4th weekly injection, appt made No Patient next injection due: 1 week, appt made No  [name]

## 2019-01-03 ENCOUNTER — Other Ambulatory Visit: Payer: Self-pay

## 2019-01-03 ENCOUNTER — Ambulatory Visit (INDEPENDENT_AMBULATORY_CARE_PROVIDER_SITE_OTHER): Payer: Medicare Other | Admitting: *Deleted

## 2019-01-03 DIAGNOSIS — E538 Deficiency of other specified B group vitamins: Secondary | ICD-10-CM | POA: Diagnosis not present

## 2019-01-03 MED ORDER — CYANOCOBALAMIN 1000 MCG/ML IJ SOLN
1000.0000 ug | Freq: Once | INTRAMUSCULAR | Status: AC
  Administered 2019-01-03: 1000 ug via INTRAMUSCULAR

## 2019-01-03 NOTE — Progress Notes (Signed)
Tabitha Green is a 73 y.o. female presents to the office today for second weekly Vit B12 injections, per physician's orders. Original order: 12/10/2018 -b12 injection every day for 1 week, then every week for 4 weeks. About 1 week after her 4th weekly injection I'll check another B12 level  Vitamin B12, 1040mcg, IM was administered left deltoid today. Patient tolerated injection. Patient due for follow up labs/provider appt: No. Date due: 1 week after 4th weekly injection, appt made No Patient next injection due: 1 week, appt made: yes

## 2019-01-03 NOTE — Addendum Note (Signed)
Addended by: Katina Dung on: 01/03/2019 12:43 PM   Modules accepted: Orders

## 2019-01-03 NOTE — Progress Notes (Signed)
Pt with vit B12 deficiency.  Agree with vit B12 1000 mcg IM in office today. Signed:  Crissie Sickles, MD           01/03/2019

## 2019-01-10 ENCOUNTER — Ambulatory Visit (INDEPENDENT_AMBULATORY_CARE_PROVIDER_SITE_OTHER): Payer: Medicare Other | Admitting: Family Medicine

## 2019-01-10 ENCOUNTER — Encounter: Payer: Self-pay | Admitting: Family Medicine

## 2019-01-10 ENCOUNTER — Other Ambulatory Visit: Payer: Self-pay

## 2019-01-10 ENCOUNTER — Other Ambulatory Visit: Payer: Self-pay | Admitting: Family Medicine

## 2019-01-10 VITALS — BP 155/92 | HR 74

## 2019-01-10 DIAGNOSIS — F411 Generalized anxiety disorder: Secondary | ICD-10-CM | POA: Diagnosis not present

## 2019-01-10 DIAGNOSIS — N183 Chronic kidney disease, stage 3 unspecified: Secondary | ICD-10-CM

## 2019-01-10 DIAGNOSIS — I10 Essential (primary) hypertension: Secondary | ICD-10-CM | POA: Diagnosis not present

## 2019-01-10 MED ORDER — HYDROCHLOROTHIAZIDE 25 MG PO TABS: 25.0000 mg | ORAL_TABLET | Freq: Every day | ORAL | 0 refills | Status: DC

## 2019-01-10 MED ORDER — LOSARTAN POTASSIUM 100 MG PO TABS: 100.0000 mg | ORAL_TABLET | Freq: Every day | ORAL | 3 refills | Status: DC

## 2019-01-10 NOTE — Progress Notes (Signed)
Virtual Visit via Video Note  I connected with pt on 01/10/19 at 11:00 AM EDT by a video enabled telemedicine application and verified that I am speaking with the correct person using two identifiers.  Location patient: home Location provider:work or home office Persons participating in the virtual visit: patient, provider  I discussed the limitations of evaluation and management by telemedicine and the availability of in person appointments. The patient expressed understanding and agreed to proceed.  Telemedicine visit is a necessity given the COVID-19 restrictions in place at the current time.  HPI: [name] being seen for 3 week f/u HTN. BP had improved some with increase of losartan from 50 to 100 mg qd. We kept meds the same last visit, with the hope that her bp would further improve with better treatment of her chronic anxiety.  She was to pick up her lorazepam and get started on it.  Interim hx: Taking 1/2 lorazepam qd, occ bid and says this is helping. Still with bp's avg 150s/90s.  Occ check 120/70, and occ 170s/100s. Higher in mornings than evenings.  HRs avg 65-70. Taking meds as rx'd.   She remains highly anxious about her bp's.  ROS: See pertinent positives and negatives per HPI.  Past Medical History:  Diagnosis Date  . Anemia of chronic kidney failure    r/t kidney function- receives Procrit  . Anxiety   . Chronic renal insufficiency, stage III (moderate) (HCC) 10/2015   Post-transplant Cr 1.3, GFR 41 as of June 2017 f/u w/ [facility]  . CMV infection [facility]) summer 2017   Valcyte per ID/Renal transplant team  . Diverticulosis    a. 05/2012 colonoscopy  . Erosive lichen planus of vulva    Topical steroids (managed by Dr. Mila Palmer [facility] via [facility].  . ESRD (end stage renal disease) ([place])    began dialysis 2014--followed by Dr. [name].  Received deceased donor kidney transplant 10/2015.  [name] FSGS (focal segmental  glomerulosclerosis)    right kidney; hx of left renal cell cancer and got nephrectomy 1993.  [name] GERD (gastroesophageal reflux disease)   . Gout   . Heart murmur    (Diastolic) ECHO 03/3381 showed that this murmur is coming from pt's R arm AV fistula  . Hiatal hernia   . History of renal cell cancer 1993   Left nephrectomy  . Hyperlipidemia   . Hypertension   . Hypothyroidism   . Impingement syndrome of left shoulder 04/2016   Dr. Christy Sartorius to PT  . Metatarsal fracture, pathologic 01/2018   Right; orthocarolina-->post op shoe continued, f/u x-ray planned.  . Osteoarthritis of left knee 08/2017   severe, diffuse, tricompartmental.  Responded to steroid injection.    . Osteoporosis 06/07/2016   DEXA T-score of -3.1.  Fosamax planned 2017 but dental work prevented start..  Pathologic toe fx 12/2017--repeat DEXA 08/2018, T-score -3.3 radius.  . Renal transplant recipient 11/06/2015   Baseline Cr as of 02/2018= 1.0-1.2.  . SVT (supraventricular tachycardia) ([place])   . Vitamin B12 deficiency 12/2018   Starting replacement as of 12/11/2018    Past Surgical History:  Procedure Laterality Date  . AV FISTULA PLACEMENT Right    Right arm: aneurismal dilatation 2017 being followed by CV surgeons  . CARDIOVASCULAR STRESS TEST     03/10/15 ETT ([facility]): Exercise ECG negative at 83% max predicted HR.   [name] COLONOSCOPY  2003; 05/2012   diverticulosis, no polyps.  Recall 10 yrs  .  colonoscopy with polypectomy     Dr Henrene Pastor  . DEXA  06/07/2016; 08/22/2018   2017 T-score -3.1.  2020 T score -3.3  . KIDNEY TRANSPLANT  2015/11/20   Deceased donor kidney transplant, with Thymoglobulin induction  . LIGATION OF ARTERIOVENOUS  FISTULA Right 02/14/2017   Procedure: LIGATION OF ARTERIOVENOUS  FISTULA;  Surgeon: Angelia Mould, MD;  Location: [place];  Service: Vascular;  Laterality: Right;  . NEPHRECTOMY  1993   for malignancy- left  . RENAL BIOPSY     right  . RESECTION OF ARTERIOVENOUS FISTULA  ANEURYSM Right 02/14/2017   Procedure: EXCISION OF RIGHT BRACHIOCEPHALIC ARTERIOVENOUS FISTULA ANEURYSM;  Surgeon: Angelia Mould, MD;  Location: [place];  Service: Vascular;  Laterality: Right;  . TEE WITHOUT CARDIOVERSION N/A 08/27/2013   Procedure: TRANSESOPHAGEAL ECHOCARDIOGRAM (TEE);  Surgeon: [name], MD;  Location: [facility] ENDOSCOPY;  Service: Cardiovascular;  Laterality: N/A;  . TOTAL ABDOMINAL HYSTERECTOMY W/ BILATERAL SALPINGOOPHORECTOMY  1997   fibroids  . TRANSTHORACIC ECHOCARDIOGRAM     03/10/15 echo ([facility]): LV cavity normal in size, focal basal hypertrophy, normal systolic function, EF 50% (visual est). Normal wall motion, no regional wall motion abnormalities. Mild diastolic dysfunction with normal LA chamber size. No significant valve stenosis or regurgitation.  . VULVA / PERINEUM BIOPSY  2015    Family History  Problem Relation Age of Onset  . Deep vein thrombosis Mother        post thyroid surgery  . Hypertension Mother   . Heart attack Father 40       deceased  . Hypertension Father   . Cancer Sister        breast  . Cancer Paternal Aunt        pancreatic  . Diabetes Maternal Grandfather   . Stroke Paternal Grandmother        in 49s  . Colon cancer Neg Hx   . Esophageal cancer Neg Hx   . Stomach cancer Neg Hx   . Rectal cancer Neg Hx      Current Outpatient Medications:  .  acetaminophen (TYLENOL) 500 MG tablet, Take 500-750 mg by mouth every 6 (six) hours as needed for pain., Disp: , Rfl:  .  aspirin EC 81 MG EC tablet, Take 1 tablet (81 mg total) by mouth daily., Disp: , Rfl:  .  AYR SALINE NASAL DROPS NA, Place 2 sprays into both nostrils daily., Disp: , Rfl:  .  Camphor-Eucalyptus-Menthol (VICKS VAPORUB EX), Apply 1 application topically daily., Disp: , Rfl:  .  cyanocobalamin (,VITAMIN B-12,) 1000 MCG/ML injection, 1 ml IM qd x 7d, then 1 ml IM q7d x 4, Disp: 11 mL, Rfl: 0 .  doxazosin (CARDURA) 2 MG tablet, Take 2 mg by  mouth 2 (two) times daily. , Disp: , Rfl:  .  fluticasone (FLONASE) 50 MCG/ACT nasal spray, one spray by Both Nostrils route daily., Disp: , Rfl:  .  levothyroxine (SYNTHROID, LEVOTHROID) 125 MCG tablet, Take 1 tablet (125 mcg total) by mouth daily., Disp: 90 tablet, Rfl: 1 .  LORazepam (ATIVAN) 0.5 MG tablet, Take 1 tablet (0.5 mg total) by mouth 2 (two) times daily as needed for anxiety., Disp: 30 tablet, Rfl: 1 .  losartan (COZAAR) 50 MG tablet, Take 1 tablet (50 mg total) by mouth daily., Disp: 30 tablet, Rfl: 6 .  Magnesium Oxide 500 MG TABS, Take 1 tablet by mouth daily., Disp: , Rfl:  .  meclizine (ANTIVERT) 25 MG  tablet, Take 1 tablet (25 mg total) by mouth 3 (three) times daily as needed for dizziness or nausea., Disp: 20 tablet, Rfl: 0 .  mycophenolate (MYFORTIC) 360 MG TBEC EC tablet, Take 360 mg by mouth 2 (two) times daily., Disp: , Rfl:  .  silver sulfADIAZINE (SILVADENE) 1 % cream, Apply 1 application topically daily. Mixes with Triamcinolone cream, Disp: , Rfl:  .  SODIUM BICARBONATE PO, Take 650 mg by mouth 2 (two) times daily. Takes 2 in AM and 2 at bedtime, Disp: , Rfl:  .  tacrolimus (PROGRAF) 1 MG capsule, Take 4 mg by mouth 2 (two) times daily. Pt takes 3 in the morning and 3 at night., Disp: , Rfl:  .  triamcinolone (NASACORT ALLERGY 24HR) 55 MCG/ACT AERO nasal inhaler, Place 2 sprays into the nose daily., Disp: , Rfl:  .  triamcinolone cream (KENALOG) 0.1 %, Apply 1 application topically daily., Disp: , Rfl:  .  verapamil (CALAN-SR) 120 MG CR tablet, 1 tab po every afternoon, Disp: 90 tablet, Rfl: 0 .  verapamil (CALAN-SR) 240 MG CR tablet, 1 tab po every morning, Disp: 90 tablet, Rfl: 1  EXAM:  VITALS per patient if applicable: BP (!) 700/17 (BP Location: Left Arm, Patient Position: Sitting, Cuff Size: Large)   Pulse 74    GENERAL: alert, oriented, appears well and in no acute distress  HEENT: atraumatic, conjunttiva clear, no obvious abnormalities on inspection of  external nose and ears  NECK: normal movements of the head and neck  LUNGS: on inspection no signs of respiratory distress, breathing rate appears normal, no obvious gross SOB, gasping or wheezing  CV: no obvious cyanosis  MS: moves all visible extremities without noticeable abnormality  PSYCH/NEURO: pleasant and cooperative, no obvious depression or anxiety, speech and thought processing grossly intact  LABS: none today    Chemistry      Component Value Date/Time   NA 139 12/10/2018 0910   K 4.4 12/10/2018 0910   CL 107 12/10/2018 0910   CO2 23 12/10/2018 0910   BUN 16 12/10/2018 0910   CREATININE 1.01 12/10/2018 0910   CREATININE 3.61 (H) 09/16/2015 1515      Component Value Date/Time   CALCIUM 9.1 12/10/2018 0910   CALCIUM 8.4 08/30/2012 0819   ALKPHOS 71 12/14/2012 0415   AST 24 12/14/2012 0415   ALT 21 12/14/2012 0415   BILITOT 0.3 12/14/2012 0415      ASSESSMENT AND PLAN:  Discussed the following assessment and plan:  1) Uncontrolled HTN: Continue losartan 100 mg qd, cardura 4 mg bid, and cardizem bid. Add hctz 25mg  qd.   Check BMET and MAg tomorrow when she comes in for her B12 injection and again in 1 wk.  2) Anxiety:  Improved some since starting lorazepam. Inc loraz to a whole 0.5mg  tab qd-bid.  3) Vit B12 def: She is finished with 1 week of daily injections and is starting once weekly injections tomorrow. Will recheck b12 level in 2 wks.   I discussed the assessment and treatment plan with the patient. The patient was provided an opportunity to ask questions and all were answered. The patient agreed with the plan and demonstrated an understanding of the instructions.   The patient was advised to call back or seek an in-person evaluation if the symptoms worsen or if the condition fails to improve as anticipated.  F/u: 3 wks.  Signed:  Crissie Sickles, MD           01/10/2019

## 2019-01-11 ENCOUNTER — Ambulatory Visit (INDEPENDENT_AMBULATORY_CARE_PROVIDER_SITE_OTHER): Payer: Medicare Other | Admitting: Family Medicine

## 2019-01-11 DIAGNOSIS — N183 Chronic kidney disease, stage 3 unspecified: Secondary | ICD-10-CM

## 2019-01-11 DIAGNOSIS — E538 Deficiency of other specified B group vitamins: Secondary | ICD-10-CM

## 2019-01-11 LAB — BASIC METABOLIC PANEL
BUN: 18 mg/dL (ref 6–23)
CO2: 24 mEq/L (ref 19–32)
Calcium: 9.3 mg/dL (ref 8.4–10.5)
Chloride: 105 mEq/L (ref 96–112)
Creatinine, Ser: 1.07 mg/dL (ref 0.40–1.20)
GFR: 50.22 mL/min — ABNORMAL LOW (ref >60.00)
Glucose, Bld: 136 mg/dL — ABNORMAL HIGH (ref 70–99)
Potassium: 3.9 mEq/L (ref 3.5–5.1)
Sodium: 139 mEq/L (ref 135–145)

## 2019-01-11 LAB — MAGNESIUM: Magnesium: 1.5 mg/dL (ref 1.5–2.5)

## 2019-01-11 MED ORDER — CYANOCOBALAMIN 1000 MCG/ML IJ SOLN
1000.0000 ug | Freq: Once | INTRAMUSCULAR | Status: AC
  Administered 2019-01-11: 1000 ug via INTRAMUSCULAR

## 2019-01-11 NOTE — Progress Notes (Signed)
Tabitha Green is a 73 y.o. female presents to the office today forVitamin B12 injections, per physician's orders. Original order: b12 injection every day for 1 week, then every week for 4 weeks. About 1 week after her 4th weekly injection I'll check another B12 level  Vitamin B12, 1020mcg, IM was administered left deltoid today. Patient tolerated injection. Patient due for follow up labs/provider appt: Yes. Date due: 02/18/2019, appt made Yes Patient next injection due: 01/18/2019, appt made No   Lab appt needed for 01/25/2019.  Kathie Dike., CMA

## 2019-01-17 ENCOUNTER — Other Ambulatory Visit: Payer: Self-pay

## 2019-01-17 ENCOUNTER — Ambulatory Visit (INDEPENDENT_AMBULATORY_CARE_PROVIDER_SITE_OTHER): Payer: Medicare Other

## 2019-01-17 DIAGNOSIS — E538 Deficiency of other specified B group vitamins: Secondary | ICD-10-CM | POA: Diagnosis not present

## 2019-01-17 MED ORDER — CYANOCOBALAMIN 1000 MCG/ML IJ SOLN
1000.0000 ug | Freq: Once | INTRAMUSCULAR | Status: AC
  Administered 2019-01-17: 1000 ug via INTRAMUSCULAR

## 2019-01-17 NOTE — Progress Notes (Signed)
Tabitha Green is a 72 y.o. female presents to the office today forVitamin B12 injections, per physician's orders. Original order: b12 injection every day for 1 week, then every week for 4 weeks. About 1 week after her 4th weekly injection I'll check another B12 level Vitamin B12, 1037mcg, IM was administered left deltoid today. Patient tolerated injection. Patient due for follow up labs/provider appt: Yes. Lab appt:01/28/19 Date due: 02/18/2019, appt made Yes  Lauree Chandler D Staton

## 2019-01-18 ENCOUNTER — Other Ambulatory Visit: Payer: Self-pay

## 2019-01-18 ENCOUNTER — Emergency Department (INDEPENDENT_AMBULATORY_CARE_PROVIDER_SITE_OTHER)
Admission: EM | Admit: 2019-01-18 | Discharge: 2019-01-18 | Disposition: A | Discharge status: Home / Self Care | Payer: Medicare Other | Source: Home / Self Care

## 2019-01-18 DIAGNOSIS — R0789 Other chest pain: Secondary | ICD-10-CM | POA: Diagnosis not present

## 2019-01-18 DIAGNOSIS — S46812A Strain of other muscles, fascia and tendons at shoulder and upper arm level, left arm, initial encounter: Secondary | ICD-10-CM

## 2019-01-18 DIAGNOSIS — F419 Anxiety disorder, unspecified: Secondary | ICD-10-CM | POA: Diagnosis not present

## 2019-01-18 MED ORDER — METHYLPREDNISOLONE ACETATE 40 MG/ML IJ SUSP
40.0000 mg | Freq: Once | INTRAMUSCULAR | Status: AC
  Administered 2019-01-18: 40 mg via INTRAMUSCULAR

## 2019-01-18 NOTE — Discharge Instructions (Signed)
°  Your pain appears to be due to a muscle strain in the back of your shoulder. It is recommended that you continue to take Tylenol every 4-6 hours as needed for pain. You may also try alternating a cool and warm pack, gentle massage and over the counter muscle rub such as IcyHot, Bengay or similar type muscle creams/ointments.  Please follow up with family medicine next week if not improving.  Call 911 or go to the hospital if symptoms worsening- worsening chest pain, difficulty breathing, dizziness, passing out, or other new concerning symptoms develop.

## 2019-01-18 NOTE — ED Triage Notes (Signed)
Pt c/o RT should pain x 2-3 weeks. Sometimes radiates into chest. Also feels like her heart is racing at times. Takes lorazepam as needed.

## 2019-01-18 NOTE — ED Provider Notes (Signed)
[name]    CSN: [id number] Arrival date & time: 01/18/19  1028     History   Chief Complaint Chief Complaint  Patient presents with  . Chest Pain  . Shoulder Pain    RT    HPI Tabitha Green is a 73 y.o. female.   HPI Tabitha Green is a 73 y.o. female presenting to UC with c/o 3 weeks of Left posterior shoulder pain that is aching and sore, constant since onset but waxes and wanes, improves with Tylenol. Pain occasionally radiates to Left upper chest. Pt was seen by her PCP last week who stated an EKG was not indicated and prescribed pt two new BP medications as well as lorazepam 0.5mg  BID PRN. Pt too half a tab of lorazepam just PTA w/o relief. Denies fever, chills, n/v/d, diaphoresis or SOB. Denies known injury but does state she lifts heavy groceries at times and has a top load washing machine she has to pull clothes out of, which can be difficult at times. Denies weakness or numbness in her arms.      Past Medical History:  Diagnosis Date  . Anemia of chronic kidney failure    r/t kidney function- receives Procrit  . Anxiety   . Chronic renal insufficiency, stage III (moderate) (HCC) 10/2015   Post-transplant Cr 1.3, GFR 41 as of June 2017 f/u w/ [facility]  . CMV infection [facility]) summer 2017   Valcyte per ID/Renal transplant team  . Diverticulosis    a. 05/2012 colonoscopy  . Erosive lichen planus of vulva    Topical steroids (managed by Dr. Mila Palmer [facility] via [facility] outpt services.  . ESRD (end stage renal disease) ([place])    began dialysis 2014--followed by Dr. [name].  Received deceased donor kidney transplant 10/2015.  [name] FSGS (focal segmental glomerulosclerosis)    right kidney; hx of left renal cell cancer and got nephrectomy 1993.  [name] GERD (gastroesophageal reflux disease)   . Gout   . Heart murmur    (Diastolic) ECHO 10/5571 showed that this murmur is coming from pt's R arm AV fistula  . Hiatal hernia   .  History of renal cell cancer 1993   Left nephrectomy  . Hyperlipidemia   . Hypertension   . Hypothyroidism   . Impingement syndrome of left shoulder 04/2016   Dr. Christy Sartorius to PT  . Metatarsal fracture, pathologic 01/2018   Right; orthocarolina-->post op shoe continued, f/u x-ray planned.  . Osteoarthritis of left knee 08/2017   severe, diffuse, tricompartmental.  Responded to steroid injection.    . Osteoporosis 06/07/2016   DEXA T-score of -3.1.  Fosamax planned 2017 but dental work prevented start..  Pathologic toe fx 12/2017--repeat DEXA 08/2018, T-score -3.3 radius.  . Renal transplant recipient 11/06/2015   Baseline Cr as of 02/2018= 1.0-1.2.  . SVT (supraventricular tachycardia) ([place])   . Vitamin B12 deficiency 12/2018   Starting replacement as of 12/11/2018    Patient Active Problem List   Diagnosis Date Noted  . Left shoulder pain 05/02/2016  . Wheeze 09/16/2015  . Cough 09/16/2015  . Acute bronchitis 09/16/2015  . History of renal carcinoma 05/14/2015  . Vulvar ulceration 06/22/2014  . End stage renal disease ([place]) 04/09/2014  . Mechanical complication of other vascular device, implant, and graft 04/09/2014  . Cervical spondylosis 10/10/2013  . Aortic insufficiency 08/20/2013  . Hyperglycemia 12/14/2012  . SVT (supraventricular tachycardia) ([place]) 12/13/2012  . Uremia 12/13/2012  . Chronic kidney  disease (CKD), stage IV (severe) ([place]) 12/07/2012  . Bradycardia 12/07/2012  . VITAMIN D DEFICIENCY 09/08/2009  . CHRON GLOMERULONEPHRIT W/LES MEMBRANOUS GLN 09/08/2009  . FATIGUE 09/08/2009  . DIVERTICULOSIS, COLON 09/26/2008  . OSTEOPENIA 09/26/2008  . COLONIC POLYPS, HX OF 09/26/2008  . Hypothyroidism 10/02/2007  . OTHER AND UNSPECIFIED HYPERLIPIDEMIA 10/02/2007  . BENIGN POSITIONAL VERTIGO 10/02/2007  . Unspecified essential hypertension 10/02/2007  . Gout, unspecified 03/26/2007  . DISORDER, DYSMETABOLIC SYNDROME X 00/93/8182  . HX, PERSONAL, MALIGNANCY,  KIDNEY 03/26/2007  . OSTEOARTHRITIS 12/26/2006    Past Surgical History:  Procedure Laterality Date  . AV FISTULA PLACEMENT Right    Right arm: aneurismal dilatation 2017 being followed by CV surgeons  . CARDIOVASCULAR STRESS TEST     03/10/15 ETT ([facility]): Exercise ECG negative at 83% max predicted HR.   [name] COLONOSCOPY  2003; 05/2012   diverticulosis, no polyps.  Recall 10 yrs  . colonoscopy with polypectomy     Dr Henrene Pastor  . DEXA  06/07/2016; 08/22/2018   2017 T-score -3.1.  2020 T score -3.3  . KIDNEY TRANSPLANT  11/10/2015   Deceased donor kidney transplant, with Thymoglobulin induction  . LIGATION OF ARTERIOVENOUS  FISTULA Right 02/14/2017   Procedure: LIGATION OF ARTERIOVENOUS  FISTULA;  Surgeon: Angelia Mould, MD;  Location: [facility];  Service: Vascular;  Laterality: Right;  . NEPHRECTOMY  1993   for malignancy- left  . RENAL BIOPSY     right  . RESECTION OF ARTERIOVENOUS FISTULA ANEURYSM Right 02/14/2017   Procedure: EXCISION OF RIGHT BRACHIOCEPHALIC ARTERIOVENOUS FISTULA ANEURYSM;  Surgeon: Angelia Mould, MD;  Location: [facility];  Service: Vascular;  Laterality: Right;  . TEE WITHOUT CARDIOVERSION N/A 08/27/2013   Procedure: TRANSESOPHAGEAL ECHOCARDIOGRAM (TEE);  Surgeon: [name], MD;  Location: [facility];  Service: Cardiovascular;  Laterality: N/A;  . TOTAL ABDOMINAL HYSTERECTOMY W/ BILATERAL SALPINGOOPHORECTOMY  1997   fibroids  . TRANSTHORACIC ECHOCARDIOGRAM     03/10/15 echo ([facility]): LV cavity normal in size, focal basal hypertrophy, normal systolic function, EF 99% (visual est). Normal wall motion, no regional wall motion abnormalities. Mild diastolic dysfunction with normal LA chamber size. No significant valve stenosis or regurgitation.  . VULVA / PERINEUM BIOPSY  2015    OB History    Gravida  2   Para  2   Term  2   Preterm  0   AB  0   Living  2     SAB  0   TAB  0   Ectopic  0   Multiple  0   Live  Births               Home Medications    Prior to Admission medications   Medication Sig Start Date End Date Taking? Authorizing Provider  acetaminophen (TYLENOL) 500 MG tablet Take 500-750 mg by mouth every 6 (six) hours as needed for pain.    [provider]  aspirin EC 81 MG EC tablet Take 1 tablet (81 mg total) by mouth daily. 12/09/12   Edmisten, Brooke O, PA-C  AYR SALINE NASAL DROPS NA Place 2 sprays into both nostrils daily.    [provider]  Camphor-Eucalyptus-Menthol (VICKS VAPORUB EX) Apply 1 application topically daily.    [provider]  cyanocobalamin (,VITAMIN B-12,) 1000 MCG/ML injection 1 ml IM qd x 7d, then 1 ml IM q7d x 4 12/10/18   McGowen, Adrian Blackwater, MD  doxazosin (CARDURA) 2  MG tablet Take 2 mg by mouth 2 (two) times daily.  07/19/16   [provider]  fluticasone (FLONASE) 50 MCG/ACT nasal spray one spray by Both Nostrils route daily. 05/23/18 05/23/19  [provider]  hydrochlorothiazide (HYDRODIURIL) 25 MG tablet Take 1 tablet (25 mg total) by mouth daily. 01/10/19   McGowen, Adrian Blackwater, MD  levothyroxine (SYNTHROID, LEVOTHROID) 125 MCG tablet Take 1 tablet (125 mcg total) by mouth daily. 09/11/18   McGowen, Adrian Blackwater, MD  LORazepam (ATIVAN) 0.5 MG tablet Take 1 tablet (0.5 mg total) by mouth 2 (two) times daily as needed for anxiety. 12/20/18   McGowen, Adrian Blackwater, MD  losartan (COZAAR) 100 MG tablet Take 1 tablet (100 mg total) by mouth daily. 01/10/19   McGowen, Adrian Blackwater, MD  Magnesium Oxide 500 MG TABS Take 1 tablet by mouth daily.    [provider]  meclizine (ANTIVERT) 25 MG tablet Take 1 tablet (25 mg total) by mouth 3 (three) times daily as needed for dizziness or nausea. 07/30/15   Noe Gens, PA-C  mycophenolate (MYFORTIC) 360 MG TBEC EC tablet Take 360 mg by mouth 2 (two) times daily.    [provider]  silver sulfADIAZINE (SILVADENE) 1 % cream Apply 1 application topically daily. Mixes with  Triamcinolone cream    [provider]  SODIUM BICARBONATE PO Take 650 mg by mouth 2 (two) times daily. Takes 2 in AM and 2 at bedtime    [provider]  tacrolimus (PROGRAF) 1 MG capsule Take 4 mg by mouth 2 (two) times daily. Pt takes 3 in the morning and 3 at night.    [provider]  triamcinolone (NASACORT ALLERGY 24HR) 55 MCG/ACT AERO nasal inhaler Place 2 sprays into the nose daily.    [provider]  triamcinolone cream (KENALOG) 0.1 % Apply 1 application topically daily.    [provider]  verapamil (CALAN-SR) 120 MG CR tablet 1 tab po every afternoon 12/17/18   McGowen, Adrian Blackwater, MD  verapamil (CALAN-SR) 240 MG CR tablet 1 tab po every morning 11/12/18   McGowen, Adrian Blackwater, MD    Family History Family History  Problem Relation Age of Onset  . Deep vein thrombosis Mother        post thyroid surgery  . Hypertension Mother   . Heart attack Father 86       deceased  . Hypertension Father   . Cancer Sister        breast  . Cancer Paternal Aunt        pancreatic  . Diabetes Maternal Grandfather   . Stroke Paternal Grandmother        in 77s  . Colon cancer Neg Hx   . Esophageal cancer Neg Hx   . Stomach cancer Neg Hx   . Rectal cancer Neg Hx     Social History Social History   Tobacco Use  . Smoking status: Never Smoker  . Smokeless tobacco: Never Used  Substance Use Topics  . Alcohol use: Yes    Comment: rarely  . Drug use: No     Allergies   Cefaclor, Naproxen, Enalapril maleate, and Metoprolol tartrate   Review of Systems Review of Systems  Constitutional: Negative for chills and fever.  HENT: Negative for congestion, ear pain, sore throat, trouble swallowing and voice change.   Respiratory: Negative for cough and shortness of breath.   Cardiovascular: Positive for chest pain. Negative for palpitations.  Gastrointestinal: Negative for abdominal  pain, diarrhea, nausea and vomiting.  Musculoskeletal: Positive for  arthralgias, back pain and myalgias.       Left posterior shoulder  Skin: Negative for rash.  Psychiatric/Behavioral: The patient is nervous/anxious.      Physical Exam Triage Vital Signs ED Triage Vitals  Enc Vitals Group     BP 01/18/19 1050 125/81     Pulse Rate 01/18/19 1050 88     Resp 01/18/19 1050 18     Temp 01/18/19 1050 (!) 97.5 F (36.4 C)     Temp Source 01/18/19 1050 Oral     SpO2 01/18/19 1050 95 %     Weight 01/18/19 1051 243 lb (110.2 kg)     Height 01/18/19 1051 5\' 5"  (1.651 m)     Head Circumference --      Peak Flow --      Pain Score 01/18/19 1051 0     Pain Loc --      Pain Edu? --      Excl. in [place]? --    No data found.  Updated Vital Signs BP 125/81 (BP Location: Left Arm)   Pulse 88   Temp (!) 97.5 F (36.4 C) (Oral)   Resp 18   Ht 5\' 5"  (1.651 m)   Wt 243 lb (110.2 kg)   SpO2 95%   BMI 40.44 kg/m   Visual Acuity Right Eye Distance:   Left Eye Distance:   Bilateral Distance:    Right Eye Near:   Left Eye Near:    Bilateral Near:     Physical Exam Vitals signs and nursing note reviewed.  Constitutional:      General: She is not in acute distress.    Appearance: She is well-developed. She is not ill-appearing, toxic-appearing or diaphoretic.  HENT:     Head: Normocephalic and atraumatic.     Right Ear: Tympanic membrane normal.     Left Ear: Tympanic membrane normal.     Nose: Nose normal.     Mouth/Throat:     Lips: Pink.     Mouth: Mucous membranes are moist.     Pharynx: Oropharynx is clear. Uvula midline.  Neck:     Musculoskeletal: Normal range of motion.  Cardiovascular:     Rate and Rhythm: Normal rate and regular rhythm.  Pulmonary:     Effort: Pulmonary effort is normal.     Breath sounds: No decreased breath sounds, wheezing, rhonchi or rales.  Chest:     Chest wall: Tenderness present.       Comments: Pendulous breasts  Musculoskeletal: Normal range of motion.     Cervical back: She exhibits normal range of  motion and no bony tenderness.       Back:  Skin:    General: Skin is warm and dry.  Neurological:     Mental Status: She is alert and oriented to person, place, and time.  Psychiatric:        Behavior: Behavior normal.      UC Treatments / Results  Labs (all labs ordered are listed, but only abnormal results are displayed) Labs Reviewed - No data to display  EKG Date/Time:01/18/2019   10:46:53 Ventricular Rate: 82 PR Interval: 234 QRS Duration: 96 QT Interval: 386 QTC Calculation: 450 P-R-T axes:  15    -22     67 Text Interpretation: Sinus rhythm with 1st degree AV block. Otherwise normal EKG. No change from last EKG on 02/14/2017    Radiology No results found.  Procedures Procedures (including critical care time)  Medications Ordered in UC Medications  methylPREDNISolone acetate (DEPO-MEDROL) injection 40 mg (40 mg Intramuscular Given 01/18/19 1109)    Initial Impression / Assessment and Plan / UC Course  I have reviewed the triage vital signs and the nursing notes.  Pertinent labs & imaging results that were available during my care of the patient were reviewed by me and considered in my medical decision making (see chart for details).     Chest pain typical for ACS. EKG unchanged Pain appears to be from musculoskeletal cause Conservative treatment recommended. Pt notes she has received shots in her shoulder and knee for pain in the past. Will tx with 40mg  depo-medrol IM today Home care info provided. Discussed symptoms that warrant emergent care in the ED.  Final Clinical Impressions(s) / UC Diagnoses   Final diagnoses:  Trapezius muscle strain, left, initial encounter  Left-sided chest wall pain  Anxiety     Discharge Instructions      Your pain appears to be due to a muscle strain in the back of your shoulder. It is recommended that you continue to take Tylenol every 4-6 hours as needed for pain. You may also try alternating a cool  and warm pack, gentle massage and over the counter muscle rub such as IcyHot, Bengay or similar type muscle creams/ointments.  Please follow up with family medicine next week if not improving.  Call 911 or go to the hospital if symptoms worsening- worsening chest pain, difficulty breathing, dizziness, passing out, or other new concerning symptoms develop.     ED Prescriptions    None     Controlled Substance Prescriptions [place] Controlled Substance Registry consulted? Not Applicable   Tyrell Antonio 01/18/19 1130

## 2019-01-28 ENCOUNTER — Encounter: Payer: Self-pay | Admitting: Family Medicine

## 2019-01-28 ENCOUNTER — Ambulatory Visit (INDEPENDENT_AMBULATORY_CARE_PROVIDER_SITE_OTHER): Payer: Medicare Other | Admitting: Family Medicine

## 2019-01-28 ENCOUNTER — Other Ambulatory Visit: Payer: Self-pay

## 2019-01-28 VITALS — BP 107/70 | HR 76 | Temp 98.4°F | Resp 16 | Ht 66.0 in | Wt 244.4 lb

## 2019-01-28 DIAGNOSIS — I10 Essential (primary) hypertension: Secondary | ICD-10-CM | POA: Diagnosis not present

## 2019-01-28 DIAGNOSIS — E538 Deficiency of other specified B group vitamins: Secondary | ICD-10-CM | POA: Diagnosis not present

## 2019-01-28 LAB — BASIC METABOLIC PANEL
BUN: 22 mg/dL (ref 6–23)
CO2: 27 mEq/L (ref 19–32)
Calcium: 9.7 mg/dL (ref 8.4–10.5)
Chloride: 101 mEq/L (ref 96–112)
Creatinine, Ser: 1.11 mg/dL (ref 0.40–1.20)
GFR: 48.13 mL/min — ABNORMAL LOW (ref >60.00)
Glucose, Bld: 100 mg/dL — ABNORMAL HIGH (ref 70–99)
Potassium: 3.9 mEq/L (ref 3.5–5.1)
Sodium: 137 mEq/L (ref 135–145)

## 2019-01-28 LAB — MAGNESIUM: Magnesium: 1.5 mg/dL (ref 1.5–2.5)

## 2019-01-28 LAB — VITAMIN B12: Vitamin B-12: 786 pg/mL (ref 211–911)

## 2019-01-28 MED ORDER — CYANOCOBALAMIN 1000 MCG/ML IJ SOLN
1000.0000 ug | Freq: Once | INTRAMUSCULAR | Status: AC
  Administered 2019-01-28: 1000 ug via INTRAMUSCULAR

## 2019-01-28 NOTE — Addendum Note (Signed)
Addended by: Ralph Dowdy on: 01/28/2019 11:31 AM   Modules accepted: Orders

## 2019-01-28 NOTE — Progress Notes (Signed)
OFFICE VISIT  01/28/2019   CC:  Chief Complaint  Patient presents with  . Follow-up    hypertension, recheck B12 level, pt is fasting   HPI:    Patient is a 73 y.o. Caucasian female who presents for f/u HTN and vit B12 def.  Of note, she did get dx of left sided trapezius strain with L sided musculoskeletal chest wall pain on 01/18/19 ED visit. This pain is unchanged.  It is not brought on by exertion.  No palpitations. No diaphoresis.  No dizziness.  No focal weakness. Last o/v I added hctz 25mg .  BP 130/80 avg.  No side effects from hctz.  Finding lorazepam helpful, takes it most mornings now.  Since getting on B12 inj's she has had significant improvement in bilat LL/feet pains, hand pains, and tongue  Pains.  ROS: no SOB, no wheezing, no cough, no dizziness, no HAs, no rashes, no melena/hematochezia.  No polyuria or polydipsia.    Past Medical History:  Diagnosis Date  . Anemia of chronic kidney failure    r/t kidney function- receives Procrit  . Anxiety   . Chronic renal insufficiency, stage III (moderate) (HCC) 10/2015   Post-transplant Cr 1.3, GFR 41 as of June 2017 f/u w/ [facility]  . CMV infection [facility]) summer 2017   Valcyte per ID/Renal transplant team  . Diverticulosis    a. 05/2012 colonoscopy  . Erosive lichen planus of vulva    Topical steroids (managed by Dr. Mila Palmer [facility].  . ESRD (end stage renal disease) ([place])    began dialysis 2014--followed by Dr. [name].  Received deceased donor kidney transplant 10/2015.  [name] FSGS (focal segmental glomerulosclerosis)    right kidney; hx of left renal cell cancer and got nephrectomy 1993.  [name] GERD (gastroesophageal reflux disease)   . Gout   . Heart murmur    (Diastolic) ECHO 09/6376 showed that this murmur is coming from pt's R arm AV fistula  . Hiatal hernia   . History of renal cell cancer 1993   Left nephrectomy  . Hyperlipidemia   . Hypertension    . Hypothyroidism   . Impingement syndrome of left shoulder 04/2016   Dr. Christy Sartorius to PT  . Metatarsal fracture, pathologic 01/2018   Right; orthocarolina-->post op shoe continued, f/u x-ray planned.  . Osteoarthritis of left knee 08/2017   severe, diffuse, tricompartmental.  Responded to steroid injection.    . Osteoporosis 06/07/2016   DEXA T-score of -3.1.  Fosamax planned 2017 but dental work prevented start..  Pathologic toe fx 12/2017--repeat DEXA 08/2018, T-score -3.3 radius.  . Renal transplant recipient 11-30-2015   Baseline Cr as of 02/2018= 1.0-1.2.  . SVT (supraventricular tachycardia) ([place])   . Vitamin B12 deficiency 12/2018   Starting replacement as of 12/11/2018    Past Surgical History:  Procedure Laterality Date  . AV FISTULA PLACEMENT Right    Right arm: aneurismal dilatation 2017 being followed by CV surgeons  . CARDIOVASCULAR STRESS TEST     03/10/15 ETT ([facility]): Exercise ECG negative at 83% max predicted HR.   [name] COLONOSCOPY  2003; 05/2012   diverticulosis, no polyps.  Recall 10 yrs  . colonoscopy with polypectomy     Dr Henrene Pastor  . DEXA  06/07/2016; 08/22/2018   2017 T-score -3.1.  2020 T score -3.3  . KIDNEY TRANSPLANT  11/30/2015   Deceased donor kidney transplant, with Thymoglobulin induction  . LIGATION OF ARTERIOVENOUS  FISTULA Right  02/14/2017   Procedure: LIGATION OF ARTERIOVENOUS  FISTULA;  Surgeon: Angelia Mould, MD;  Location: [place];  Service: Vascular;  Laterality: Right;  . NEPHRECTOMY  1993   for malignancy- left  . RENAL BIOPSY     right  . RESECTION OF ARTERIOVENOUS FISTULA ANEURYSM Right 02/14/2017   Procedure: EXCISION OF RIGHT BRACHIOCEPHALIC ARTERIOVENOUS FISTULA ANEURYSM;  Surgeon: Angelia Mould, MD;  Location: [place];  Service: Vascular;  Laterality: Right;  . TEE WITHOUT CARDIOVERSION N/A 08/27/2013   Procedure: TRANSESOPHAGEAL ECHOCARDIOGRAM (TEE);  Surgeon: [name], MD;  Location: [facility] ENDOSCOPY;  Service:  Cardiovascular;  Laterality: N/A;  . TOTAL ABDOMINAL HYSTERECTOMY W/ BILATERAL SALPINGOOPHORECTOMY  1997   fibroids  . TRANSTHORACIC ECHOCARDIOGRAM     03/10/15 echo ([facility]): LV cavity normal in size, focal basal hypertrophy, normal systolic function, EF 02% (visual est). Normal wall motion, no regional wall motion abnormalities. Mild diastolic dysfunction with normal LA chamber size. No significant valve stenosis or regurgitation.  . VULVA / PERINEUM BIOPSY  2015    Outpatient Medications Prior to Visit  Medication Sig Dispense Refill  . acetaminophen (TYLENOL) 500 MG tablet Take 500-750 mg by mouth every 6 (six) hours as needed for pain.    [name] aspirin EC 81 MG EC tablet Take 1 tablet (81 mg total) by mouth daily.    Skipper Cliche SALINE NASAL DROPS NA Place 2 sprays into both nostrils daily.    . Camphor-Eucalyptus-Menthol (VICKS VAPORUB EX) Apply 1 application topically daily.    . cyanocobalamin (,VITAMIN B-12,) 1000 MCG/ML injection 1 ml IM qd x 7d, then 1 ml IM q7d x 4 11 mL 0  . doxazosin (CARDURA) 2 MG tablet Take 2 mg by mouth 2 (two) times daily.     . fluticasone (FLONASE) 50 MCG/ACT nasal spray one spray by Both Nostrils route daily.    . hydrochlorothiazide (HYDRODIURIL) 25 MG tablet Take 1 tablet (25 mg total) by mouth daily. 30 tablet 0  . levothyroxine (SYNTHROID, LEVOTHROID) 125 MCG tablet Take 1 tablet (125 mcg total) by mouth daily. 90 tablet 1  . LORazepam (ATIVAN) 0.5 MG tablet Take 1 tablet (0.5 mg total) by mouth 2 (two) times daily as needed for anxiety. 30 tablet 1  . losartan (COZAAR) 100 MG tablet Take 1 tablet (100 mg total) by mouth daily. 90 tablet 3  . Magnesium Oxide 500 MG TABS Take 1 tablet by mouth daily.    . meclizine (ANTIVERT) 25 MG tablet Take 1 tablet (25 mg total) by mouth 3 (three) times daily as needed for dizziness or nausea. 20 tablet 0  . mycophenolate (MYFORTIC) 360 MG TBEC EC tablet Take 360 mg by mouth 2 (two) times daily.    .  silver sulfADIAZINE (SILVADENE) 1 % cream Apply 1 application topically daily. Mixes with Triamcinolone cream    . SODIUM BICARBONATE PO Take 650 mg by mouth 2 (two) times daily. Takes 2 in AM and 2 at bedtime    . tacrolimus (PROGRAF) 1 MG capsule Take 4 mg by mouth 2 (two) times daily. Pt takes 3 in the morning and 3 at night.    . triamcinolone (NASACORT ALLERGY 24HR) 55 MCG/ACT AERO nasal inhaler Place 2 sprays into the nose daily.    [name] triamcinolone cream (KENALOG) 0.1 % Apply 1 application topically daily.    . verapamil (CALAN-SR) 120 MG CR tablet 1 tab po every afternoon 90 tablet 0  . verapamil (CALAN-SR) 240 MG  CR tablet 1 tab po every morning 90 tablet 1   No facility-administered medications prior to visit.     Allergies  Allergen Reactions  . Cefaclor Rash  . Naproxen Rash  . Enalapril Maleate Cough    Vasotec  . Metoprolol Tartrate Rash    On legs    ROS As per HPI  PE: Blood pressure 107/70, pulse 76, temperature 98.4 F (36.9 C), temperature source Oral, resp. rate 16, height 5\' 6"  (1.676 m), weight 244 lb 6.4 oz (110.9 kg), SpO2 95 %. Gen: Alert, well appearing.  Patient is oriented to person, place, time, and situation. AFFECT: pleasant, lucid thought and speech. CV: RRR, no m/r/g.   LUNGS: CTA bilat, nonlabored resps, good aeration in all lung fields., She has TTP across general  LABS:    Chemistry      Component Value Date/Time   NA 139 01/11/2019 1024   K 3.9 01/11/2019 1024   CL 105 01/11/2019 1024   CO2 24 01/11/2019 1024   BUN 18 01/11/2019 1024   CREATININE 1.07 01/11/2019 1024   CREATININE 3.61 (H) 09/16/2015 1515      Component Value Date/Time   CALCIUM 9.3 01/11/2019 1024   CALCIUM 8.4 08/30/2012 0819   ALKPHOS 71 12/14/2012 0415   AST 24 12/14/2012 0415   ALT 21 12/14/2012 0415   BILITOT 0.3 12/14/2012 0415     Lab Results  Component Value Date   CHOL 150 08/10/2017   HDL 40.30 08/10/2017   LDLCALC 85 08/10/2017   LDLDIRECT 166.3  10/02/2007   TRIG 122.0 08/10/2017   CHOLHDL 4 08/10/2017   Lab Results  Component Value Date   TSH 4.36 11/14/2018   Lab Results  Component Value Date   VITAMINB12 184 (L) 12/10/2018    IMPRESSION AND PLAN:  1) HTN: bp is consistently normal now. BMET and Mag level today.  2) Anxiety, improved with prn use of ativan.  No new rx for this med was needed today. Will get CSC and UDS at next f/u in 3 mo.  3) Vit B12 def: check B12 level today and then give B12 1000 mcg IM today. Her most recent vit B12 1000 mcg injection was here on 01/17/19.  An After Visit Summary was printed and given to the patient.  FOLLOW UP: Return in about 3 months (around 04/30/2019) for routine chronic illness f/u.  Signed:  Crissie Sickles, MD           01/28/2019

## 2019-01-29 ENCOUNTER — Telehealth: Payer: Self-pay

## 2019-01-29 NOTE — Telephone Encounter (Signed)
My Chart message sent

## 2019-02-02 ENCOUNTER — Other Ambulatory Visit: Payer: Self-pay | Admitting: Family Medicine

## 2019-02-04 NOTE — Progress Notes (Signed)
Pt with vit B12 deficiency.  Agree with vit B12 1000 mcg IM in office today. Signed:  Crissie Sickles, MD           02/04/2019

## 2019-02-05 ENCOUNTER — Other Ambulatory Visit: Payer: Self-pay

## 2019-02-05 ENCOUNTER — Ambulatory Visit (INDEPENDENT_AMBULATORY_CARE_PROVIDER_SITE_OTHER): Payer: Medicare Other

## 2019-02-05 ENCOUNTER — Encounter: Payer: Self-pay | Admitting: Sports Medicine

## 2019-02-05 ENCOUNTER — Ambulatory Visit (INDEPENDENT_AMBULATORY_CARE_PROVIDER_SITE_OTHER): Payer: Medicare Other | Admitting: Sports Medicine

## 2019-02-05 DIAGNOSIS — M545 Low back pain: Secondary | ICD-10-CM | POA: Diagnosis not present

## 2019-02-05 DIAGNOSIS — M48061 Spinal stenosis, lumbar region without neurogenic claudication: Secondary | ICD-10-CM

## 2019-02-05 IMAGING — DX LUMBAR SPINE - COMPLETE 4+ VIEW
5 series · 5 of 5 positions shown · non-contrast
Comparison: None.

CLINICAL DATA: Chronic mid lower back pain.

EXAM:
LUMBAR SPINE - COMPLETE 4+ VIEW

[l-spine ap]
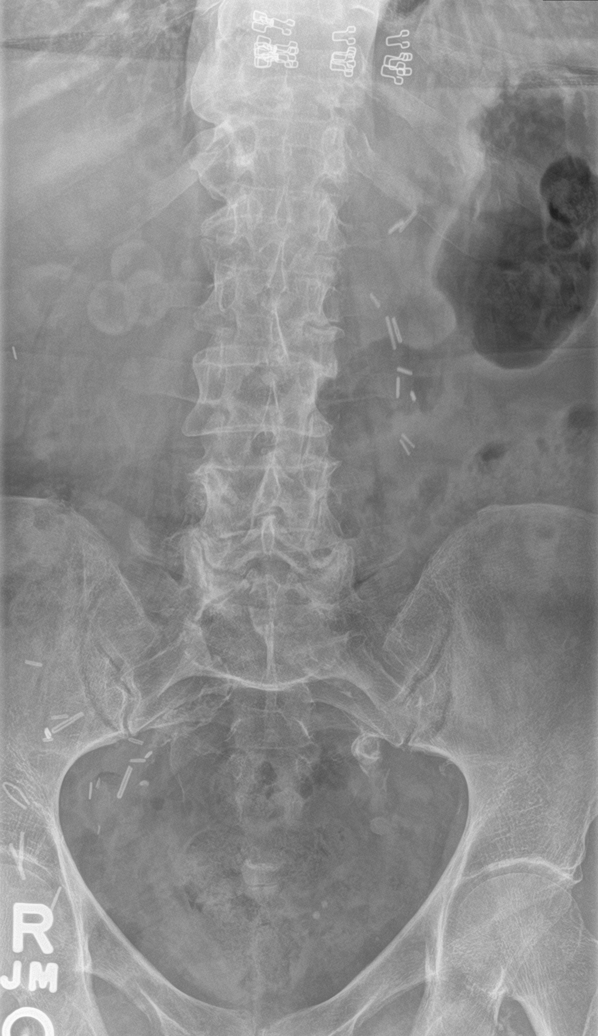

[l-spine obl (1 of 2)]
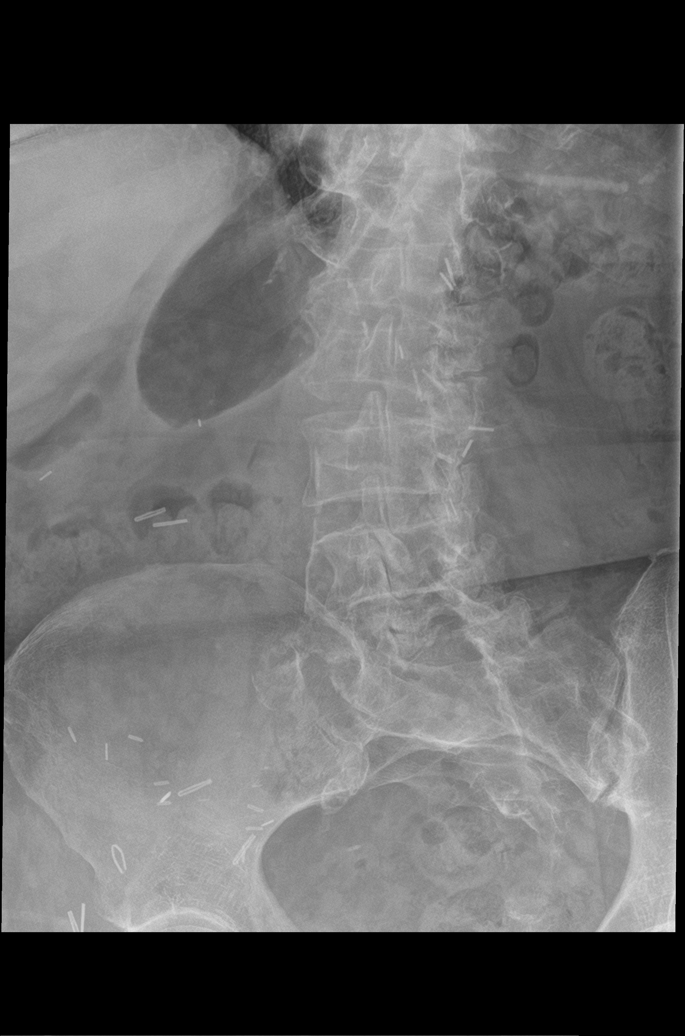

[l-spine obl (2 of 2)]
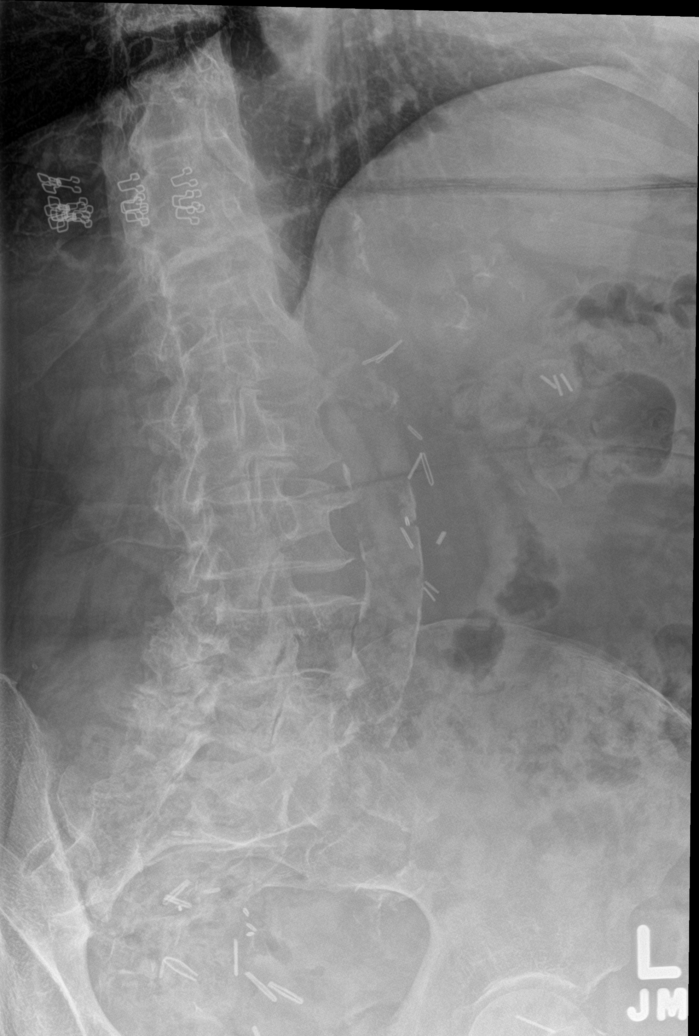

[l-spine lat]
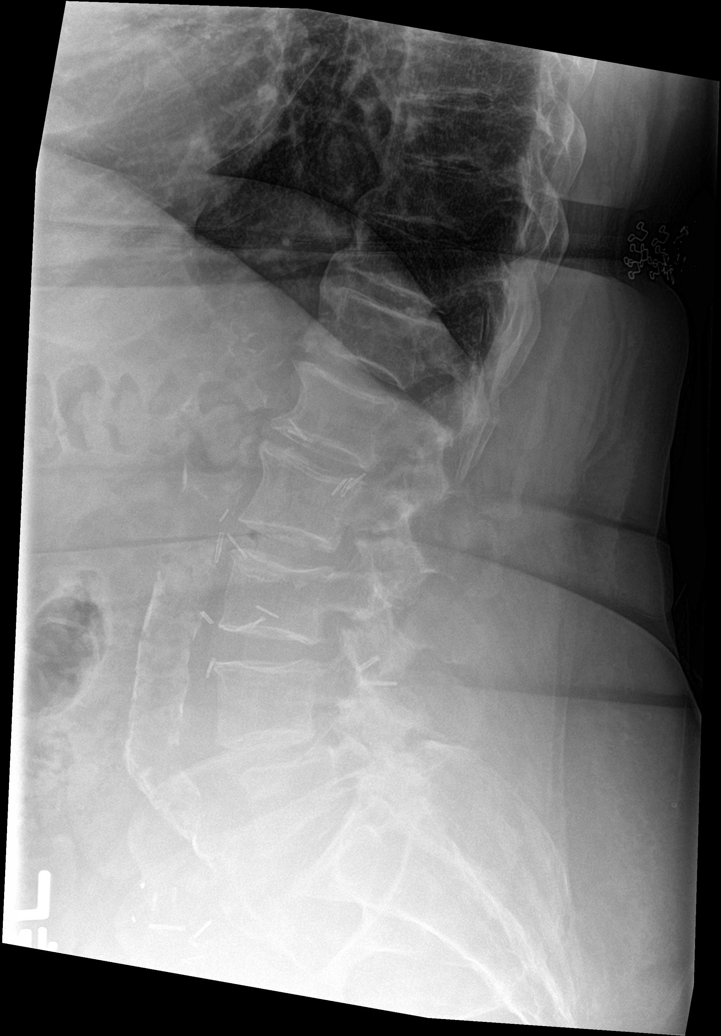

[l-spine spot]
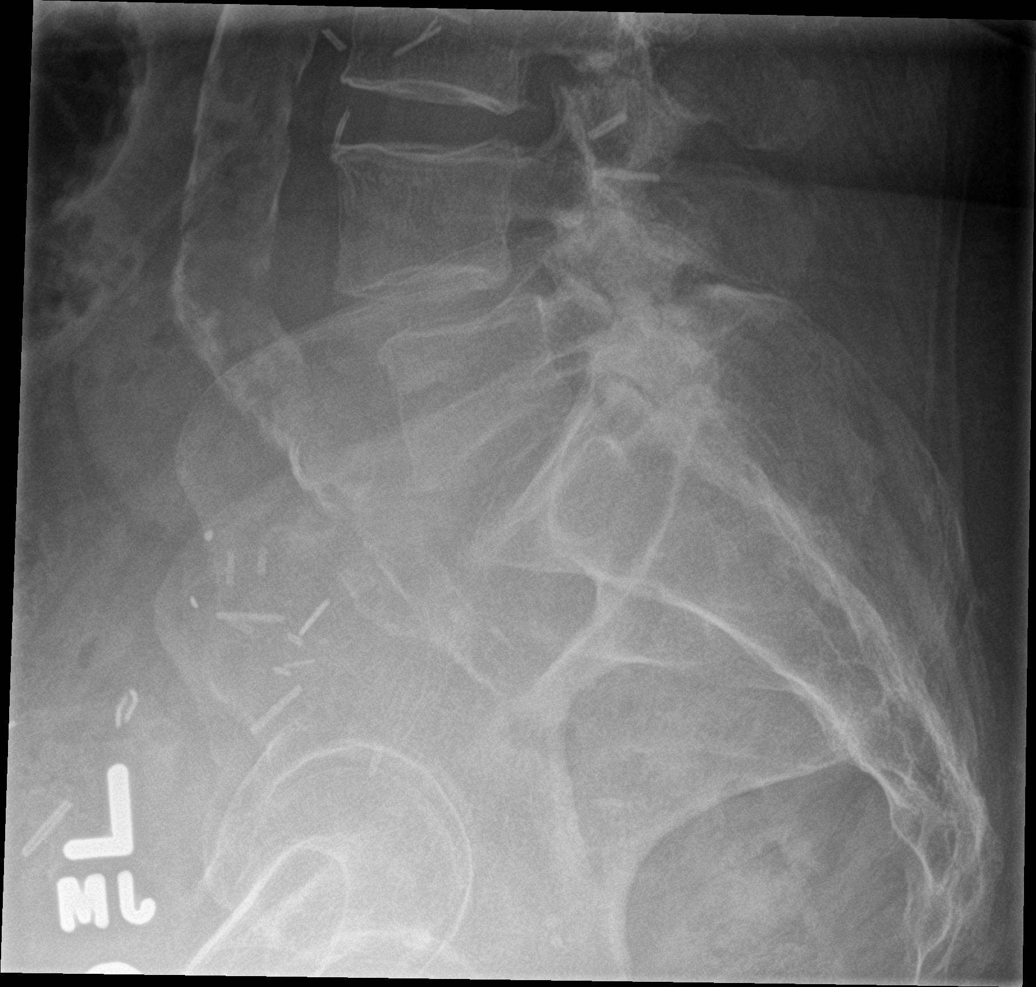

[5 of 5 positions shown; findings below may reference images not displayed]

FINDINGS: Mild grade 1 anterolisthesis of L4-5 is noted secondary to posterior
facet joint hypertrophy. No acute fracture is noted. Mild
degenerative disc disease is noted at L1-2 and L4-5. Atherosclerosis
of abdominal aorta is noted.
IMPRESSION: Mild multilevel degenerative disc disease. No acute abnormality seen
in the lumbar spine.

Aortic Atherosclerosis ([CL]-[CL]).

## 2019-02-05 MED ORDER — PREDNISONE 50 MG PO TABS: ORAL_TABLET | ORAL | 0 refills | Status: DC

## 2019-02-05 NOTE — Progress Notes (Signed)
Subjective:    CC: New patient visit with the below complaints as noted in HPI: low back pain  HPI: Patient has had lower back pain for a few years that has worsened in the past 2 weeks. She describes the pain as dull, achy, and increased with standing. It sometimes radiates to her thighs but never goes past her knees. Sitting and walking decreases the pain. She has been taking 1-2 arthritis strength Tylenol/day which does seem to decrease her pain. Denies bowel/bladder dysfunction, saddle anesthesia or any numbness/tingling.    I reviewed the past medical history, family history, social history, surgical history, and allergies today and no changes were needed.  Please see the problem list section below in epic for further details.  Past Medical History: Past Medical History:  Diagnosis Date  . Anemia of chronic kidney failure    r/t kidney function- receives Procrit  . Anxiety   . Cancer ([place])   . Chronic renal insufficiency, stage III (moderate) (HCC) 10/2015   Post-transplant Cr 1.3, GFR 41 as of June 2017 f/u w/ [facility]  . CMV infection [facility]) summer 2017   Valcyte per ID/Renal transplant team  . Diverticulosis    a. 05/2012 colonoscopy  . Erosive lichen planus of vulva    Topical steroids (managed by Dr. Mila [facility].  . ESRD (end stage renal disease) ([place])    began dialysis 2014--followed by Dr. [name].  Received deceased donor kidney transplant 10/2015.  [name] FSGS (focal segmental glomerulosclerosis)    right kidney; hx of left renal cell cancer and got nephrectomy 1993.  [name] GERD (gastroesophageal reflux disease)   . Gout   . Heart murmur    (Diastolic) ECHO 10/2353 showed that this murmur is coming from pt's R arm AV fistula  . Hiatal hernia   . History of renal cell cancer 1993   Left nephrectomy  . Hyperlipidemia   . Hypertension   . Hypothyroidism   . Impingement syndrome of left shoulder 04/2016   Dr. Christy Sartorius to PT  . Metatarsal fracture, pathologic 01/2018   Right; orthocarolina-->post op shoe continued, f/u x-ray planned.  . Osteoarthritis of left knee 08/2017   severe, diffuse, tricompartmental.  Responded to steroid injection.    . Osteoporosis 06/07/2016   DEXA T-score of -3.1.  Fosamax planned 2017 but dental work prevented start..  Pathologic toe fx 12/2017--repeat DEXA 08/2018, T-score -3.3 radius.  . Renal transplant recipient 29-Nov-2015   Baseline Cr as of 02/2018= 1.0-1.2.  . SVT (supraventricular tachycardia) ([place])   . Vitamin B12 deficiency 12/2018   Starting replacement as of 12/11/2018   Past Surgical History: Past Surgical History:  Procedure Laterality Date  . AV FISTULA PLACEMENT Right    Right arm: aneurismal dilatation 2017 being followed by CV surgeons  . CARDIOVASCULAR STRESS TEST     03/10/15 ETT ([facility]): Exercise ECG negative at 83% max predicted HR.   [name] COLONOSCOPY  2003; 05/2012   diverticulosis, no polyps.  Recall 10 yrs  . colonoscopy with polypectomy     Dr Henrene Pastor  . DEXA  06/07/2016; 08/22/2018   2017 T-score -3.1.  2020 T score -3.3  . KIDNEY TRANSPLANT  2015/11/29   Deceased donor kidney transplant, with Thymoglobulin induction  . LIGATION OF ARTERIOVENOUS  FISTULA Right 02/14/2017   Procedure: LIGATION OF ARTERIOVENOUS  FISTULA;  Surgeon: Angelia Mould, MD;  Location: [place];  Service: Vascular;  Laterality: Right;  . NEPHRECTOMY  1993   for malignancy- left  . RENAL BIOPSY     right  . RESECTION OF ARTERIOVENOUS FISTULA ANEURYSM Right 02/14/2017   Procedure: EXCISION OF RIGHT BRACHIOCEPHALIC ARTERIOVENOUS FISTULA ANEURYSM;  Surgeon: Angelia Mould, MD;  Location: [place];  Service: Vascular;  Laterality: Right;  . TEE WITHOUT CARDIOVERSION N/A 08/27/2013   Procedure: TRANSESOPHAGEAL ECHOCARDIOGRAM (TEE);  Surgeon: [name], MD;  Location: [facility] ENDOSCOPY;  Service: Cardiovascular;  Laterality: N/A;  . TOTAL ABDOMINAL  HYSTERECTOMY W/ BILATERAL SALPINGOOPHORECTOMY  1997   fibroids  . TRANSTHORACIC ECHOCARDIOGRAM     03/10/15 echo ([facility]): LV cavity normal in size, focal basal hypertrophy, normal systolic function, EF 16% (visual est). Normal wall motion, no regional wall motion abnormalities. Mild diastolic dysfunction with normal LA chamber size. No significant valve stenosis or regurgitation.  [place] / PERINEUM BIOPSY  2015   Social History: Social History   Socioeconomic History  . Marital status: Married    Spouse name: Not on file  . Number of children: 2  . Years of education: Not on file  . Highest education level: Not on file  Occupational History  . Occupation: Retired  Scientific laboratory technician  . Financial resource strain: Not on file  . Food insecurity    Worry: Not on file    Inability: Not on file  . Transportation needs    Medical: Not on file    Non-medical: Not on file  Tobacco Use  . Smoking status: Never Smoker  . Smokeless tobacco: Never Used  Substance and Sexual Activity  . Alcohol use: Yes    Comment: rarely  . Drug use: No  . Sexual activity: Not Currently    Birth control/protection: Surgical  Lifestyle  . Physical activity    Days per week: Not on file    Minutes per session: Not on file  . Stress: Not on file  Relationships  . Social Herbalist on phone: Not on file    Gets together: Not on file    Attends religious service: Not on file    Active member of club or organization: Not on file    Attends meetings of clubs or organizations: Not on file    Relationship status: Not on file  Other Topics Concern  . Not on file  Social History Narrative   Lives in [place] with husband.  Retired.  Previously worked in [employer] @ [employer]   No T/A/Ds.   Family History: Family History  Problem Relation Age of Onset  . Deep vein thrombosis Mother        post thyroid surgery  . Hypertension Mother   . Heart attack Father 39        deceased  . Hypertension Father   . Cancer Sister        breast  . Cancer Paternal Aunt        pancreatic  . Diabetes Maternal Grandfather   . Stroke Paternal Grandmother        in 70s  . Colon cancer Neg Hx   . Esophageal cancer Neg Hx   . Stomach cancer Neg Hx   . Rectal cancer Neg Hx    Allergies: Allergies  Allergen Reactions  . Cefaclor Rash  . Naproxen Rash  . Enalapril Maleate Cough    Vasotec  . Metoprolol Tartrate Rash    On legs   Medications: See med rec.  Review of Systems: No headache, visual changes,  nausea, vomiting, diarrhea, constipation, dizziness, abdominal pain, skin rash, fevers, chills, night sweats, swollen lymph nodes, weight loss, chest pain, body aches, joint swelling, muscle aches, shortness of breath, mood changes, visual or auditory hallucinations.  Objective:    General: Well Developed, well nourished, and in no acute distress.  Neuro: Alert and oriented x3, extra-ocular muscles intact, sensation grossly intact.  HEENT: Normocephalic, atraumatic, pupils equal round reactive to light, neck supple, no masses, no lymphadenopathy, thyroid nonpalpable.  Skin: Warm and dry, no rashes noted.  Cardiac: Regular rate and rhythm, no murmurs rubs or gallops.  Respiratory: Clear to auscultation bilaterally. Not using accessory muscles, speaking in full sentences.  Abdominal: Soft, nontender, nondistended, positive bowel sounds, no masses, no organomegaly.  Musculoskeletal: Shoulder, elbow, wrist, hip, knee, ankle stable, and with full range of motion. Back Exam:  Inspection: Unremarkable - no signs of trauma or swelling present.  Motion: full ROM/strength present in back and hip joints.  Pain mostly present at [facility] joint and slightly superior to this region.    Impression and Recommendations:    The patient was counselled, risk factors were discussed, anticipatory guidance given.  A: lumbar spinal stenosis P: Lumbar spine x-rays, burst of Prednisone,  physical therapy. Will return in 6 weeks. If no improvement, will obtain MRI to plan for epidural injection.   Lumbar spinal stenosis Symptoms consistent with lumbar spinal stenosis. Adding PT, at least 1 session. Prednisone, x-rays. Return to see me in 6 weeks, MRI for interventional planning if no better.   ___________________________________________ Gwen Her. Dianah Field, M.D., ABFM., CAQSM. Primary Care and Sports Medicine [facility]  Adjunct Professor of [employer] [facility] [employer]

## 2019-02-05 NOTE — Assessment & Plan Note (Signed)
Symptoms consistent with lumbar spinal stenosis. Adding PT, at least 1 session. Prednisone, x-rays. Return to see me in 6 weeks, MRI for interventional planning if no better.

## 2019-02-12 ENCOUNTER — Encounter: Payer: Self-pay | Admitting: Rehabilitative and Restorative Service Providers"

## 2019-02-12 ENCOUNTER — Other Ambulatory Visit: Payer: Self-pay

## 2019-02-12 ENCOUNTER — Other Ambulatory Visit: Payer: Self-pay | Admitting: Family Medicine

## 2019-02-12 ENCOUNTER — Ambulatory Visit: Payer: Medicare Other | Admitting: Rehabilitative and Restorative Service Providers"

## 2019-02-12 DIAGNOSIS — R29898 Other symptoms and signs involving the musculoskeletal system: Secondary | ICD-10-CM | POA: Diagnosis not present

## 2019-02-12 DIAGNOSIS — M545 Low back pain, unspecified: Secondary | ICD-10-CM

## 2019-02-12 DIAGNOSIS — M6281 Muscle weakness (generalized): Secondary | ICD-10-CM

## 2019-02-12 DIAGNOSIS — G8929 Other chronic pain: Secondary | ICD-10-CM

## 2019-02-12 NOTE — Patient Instructions (Signed)
Access Code: [email or web address]  URL: [email or web address]  Date: 02/12/2019  Prepared by: Gillermo Murdoch   Exercises  Prone Press Up - 10 reps - 1 sets - 2-3 sec hold - 2x daily - 7x weekly  Sit to Stand - 10 reps - 1 sets - 10-20 sec hold - 2x daily - 7x weekly  Prone Quadriceps Stretch with Strap - 3 reps - 1 sets - 30 seconds hold - 2x daily - 7x weekly  Seated Hip Flexor Stretch - 3 reps - 1 sets - 30 seconds hold - 2x daily - 7x weekly  Patient Education  TENS Unit  Posture and Body Mechanics

## 2019-02-12 NOTE — Therapy (Signed)
[facility] [place], [place], [address] Phone: [phone]   Fax:  [phone]  Physical Therapy Evaluation  Patient Details  Name: Tabitha Green MRN: [id number] Date of Birth: Mar 14, 1946 Referring Provider (PT): Dr Dianah Field    Encounter Date: 02/12/2019  PT End of Session - 02/12/19 1052    Visit Number  1    Number of Visits  12    Date for PT Re-Evaluation  03/26/19    PT Start Time  1004    PT Stop Time  1100    PT Time Calculation (min)  56 min    Activity Tolerance  Patient tolerated treatment well       Past Medical History:  Diagnosis Date  . Anemia of chronic kidney failure    r/t kidney function- receives Procrit  . Anxiety   . Cancer ([place])   . Chronic renal insufficiency, stage III (moderate) (HCC) 10/2015   Post-transplant Cr 1.3, GFR 41 as of June 2017 f/u w/ [facility]  . CMV infection [facility]) summer 2017   Valcyte per ID/Renal transplant team  . Diverticulosis    a. 05/2012 colonoscopy  . Erosive lichen planus of vulva    Topical steroids (managed by Dr. Mila Palmer [facility] via [facility].  . ESRD (end stage renal disease) ([place])    began dialysis 2014--followed by Dr. [name].  Received deceased donor kidney transplant 10/2015.  [name] FSGS (focal segmental glomerulosclerosis)    right kidney; hx of left renal cell cancer and got nephrectomy 1993.  [name] GERD (gastroesophageal reflux disease)   . Gout   . Heart murmur    (Diastolic) ECHO 0/8144 showed that this murmur is coming from pt's R arm AV fistula  . Hiatal hernia   . History of renal cell cancer 1993   Left nephrectomy  . Hyperlipidemia   . Hypertension   . Hypothyroidism   . Impingement syndrome of left shoulder 04/2016   Dr. Christy Sartorius to PT  . Metatarsal fracture, pathologic 01/2018   Right; orthocarolina-->post op shoe continued, f/u x-ray planned.  . Osteoarthritis of  left knee 08/2017   severe, diffuse, tricompartmental.  Responded to steroid injection.    . Osteoporosis 06/07/2016   DEXA T-score of -3.1.  Fosamax planned 2017 but dental work prevented start..  Pathologic toe fx 12/2017--repeat DEXA 08/2018, T-score -3.3 radius.  . Renal transplant recipient 12/02/15   Baseline Cr as of 02/2018= 1.0-1.2.  . SVT (supraventricular tachycardia) ([place])   . Vitamin B12 deficiency 12/2018   Starting replacement as of 12/11/2018    Past Surgical History:  Procedure Laterality Date  . AV FISTULA PLACEMENT Right    Right arm: aneurismal dilatation 2017 being followed by CV surgeons  . CARDIOVASCULAR STRESS TEST     03/10/15 ETT ([facility]): Exercise ECG negative at 83% max predicted HR.   [name] COLONOSCOPY  2003; 05/2012   diverticulosis, no polyps.  Recall 10 yrs  . colonoscopy with polypectomy     Dr Henrene Pastor  . DEXA  06/07/2016; 08/22/2018   2017 T-score -3.1.  2020 T score -3.3  . KIDNEY TRANSPLANT  2015-12-02   Deceased donor kidney transplant, with Thymoglobulin induction  . LIGATION OF ARTERIOVENOUS  FISTULA Right 02/14/2017   Procedure: LIGATION OF ARTERIOVENOUS  FISTULA;  Surgeon: Angelia Mould, MD;  Location: [facility];  Service: Vascular;  Laterality: Right;  . NEPHRECTOMY  1993   for malignancy- left  .  RENAL BIOPSY     right  . RESECTION OF ARTERIOVENOUS FISTULA ANEURYSM Right 02/14/2017   Procedure: EXCISION OF RIGHT BRACHIOCEPHALIC ARTERIOVENOUS FISTULA ANEURYSM;  Surgeon: Angelia Mould, MD;  Location: [facility];  Service: Vascular;  Laterality: Right;  . TEE WITHOUT CARDIOVERSION N/A 08/27/2013   Procedure: TRANSESOPHAGEAL ECHOCARDIOGRAM (TEE);  Surgeon: [name], MD;  Location: [facility] END[facility]COPY;  Service: Cardiovascular;  Laterality: N/A;  . TOTAL ABDOMINAL HYSTERECTOMY W/ BILATERAL SALPINGOOPHORECTOMY  1997   fibroids  . TRANSTHORACIC ECHOCARDIOGRAM     03/10/15 echo ([facility]): LV cavity normal in size, focal basal  hypertrophy, normal systolic function, EF 85% (visual est). Normal wall motion, no regional wall motion abnormalities. Mild diastolic dysfunction with normal LA chamber size. No significant valve stenosis or regurgitation.  . VULVA / PERINEUM BIOPSY  2015    There were no vitals filed for this visit.   Subjective Assessment - 02/12/19 1007    Subjective  Patient reports that she has had LBP for several years but it has been getting worse in the past several months.    Pertinent History  Kidney cancer 1993; dialysis 2014-2017; kidney transplant 2017; stent placement and removal; hysterectomy    Diagnostic tests  xrays multilevel DDD lumbar spine    Patient Stated Goals  help get rid of some of the back pain    Currently in Pain?  Yes    Pain Score  4     Pain Location  Back    Pain Orientation  Right;Left;Lower;Mid    Pain Descriptors / Indicators  Nagging    Pain Type  Chronic pain    Pain Radiating Towards  into sides of both legs at times - sometimes when standing    Pain Onset  More than a month ago    Pain Frequency  Constant    Aggravating Factors   prolonged standing or walking; bending; lifting; stairs    Pain Relieving Factors  tylenol; heat; ice         [facility] PT Assessment - 02/12/19 0001      Assessment   Medical Diagnosis  Chronic LBP     Referring Provider (PT)  Dr Dianah Field     Onset Date/Surgical Date  08/08/18   pain present for ~ 5 yrs    Hand Dominance  Left    Next MD Visit  PRN     Prior Therapy  here for Lt shoulder x 4 visits 2017      Precautions   Precautions  None      Restrictions   Weight Bearing Restrictions  No      Balance Screen   Has the patient fallen in the past 6 months  No    Has the patient had a decrease in activity level because of a fear of falling?   No    Is the patient reluctant to leave their home because of a fear of falling?   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    Leisure  houshold chores; reading; TV       Observation/Other Assessments   Focus on Therapeutic Outcomes (FOTO)   56% limitation       Sensation   Additional Comments  sometimes numb in lateral thighs       Posture/Postural Control   Posture  Comments  rounded forward posture       AROM   Lumbar Flexion  70% pain with return to upright     Lumbar Extension  40%     Lumbar - Right Side Bend  75% pulling and pain     Lumbar - Left Side Bend  75% pulling and pain     Lumbar - Right Rotation  20%     Lumbar - Left Rotation  20%       Strength   Overall Strength Comments  LE's strength grossly 4+/5 to 5-/5       Flexibility   Hamstrings  WFL's bilat     Quadriceps  tight Rt 105 deg; Lt 90 deg     ITB  WFL's     Piriformis  tight bilat       Palpation   Spinal mobility  hypomobile lumbar spine     Palpation comment  muscular tightness bilat lumbar to posterior hips ; Lt psoas       Special Tests   Other special tests  (-) SLR; slump                 Objective measurements completed on examination: See above findings.      [facility] Adult PT Treatment/Exercise - 02/12/19 0001      Lumbar Exercises: Stretches   Hip Flexor Stretch  Right;Left;2 reps;30 seconds   seated   Press Ups  10 reps   2-3 sec    Quad Stretch  Right;Left;2 reps;30 seconds   prone with strap      Lumbar Exercises: Seated   Sit to Stand  5 reps   slow eccentric stand to sit with core tight      Moist Heat Therapy   Number Minutes Moist Heat  15 Minutes    Moist Heat Location  Lumbar Spine      Electrical Stimulation   Electrical Stimulation Location  bilat lumbar     Electrical Stimulation Action  IFC    Electrical Stimulation Parameters  to tolerance    Electrical Stimulation Goals  Pain;Tone             PT Education - 02/12/19 1051    Education Details  HEP; TENS; posture/body mechanics    Person(s) Educated  Patient     Methods  Explanation;Demonstration;Tactile cues;Verbal cues;Handout    Comprehension  Verbalized understanding;Returned demonstration;Verbal cues required;Tactile cues required          PT Long Term Goals - 02/12/19 1057      PT LONG TERM GOAL #1   Title  Improve posture and alignment with patient to demonstrate improved upright posture with demo of good body mechanics for positioning and positional changes 03/26/2019    Time  6    Period  Weeks    Status  New      PT LONG TERM GOAL #2   Title  Increase LE strength and core stability with patient to demo 5-/5 to 5/5 strength LE's 03/26/2019    Time  6    Period  Weeks    Status  New      PT LONG TERM GOAL #3   Title  Decrease pain by 50-75% with functional activity allowing patient to stand and walk for 10-15 min with minimal difficulty 03/26/2019    Time  6    Period  Weeks    Status  New      PT LONG TERM GOAL #4  Title  03/26/2019    Time  6    Period  Weeks    Status  New      PT LONG TERM GOAL #5   Title  Improve FOTO to </= 46% limitation 03/26/2019    Time  6    Period  Weeks    Status  New             Plan - 02/12/19 1053    Clinical Impression Statement  Patient presents with history of chronic LBP with no known injury. She has limited trunk and LE mobility and ROM; poor core strength and stability; decreased strength bilat LE;s muscular tightness to palpation; poor body mechanics (reports that she sits on bed propped on pillows to watch TV sometimes hours at a time). Antaniya will benefit from PT to address problems identified and improve physical activity level.    Stability/Clinical Decision Making  Stable/Uncomplicated    Clinical Decision Making  Low    Rehab Potential  Good    PT Frequency  2x / week    PT Duration  6 weeks    PT Treatment/Interventions  Patient/family education;ADLs/Self Care Home Management;Cryotherapy;Electrical Stimulation;Iontophoresis 4mg /ml Dexamethasone;Moist Heat;Ultrasound;Dry  needling;Manual techniques;Neuromuscular re-education;Therapeutic activities;Therapeutic exercise;Functional mobility training    PT Next Visit Plan  review HEP; progress with core stabilization and strengthening; LE strengthening; manual work and modalities as indicated    PT [place] and Agree with Plan of Care  Patient       Patient will benefit from skilled therapeutic intervention in order to improve the following deficits and impairments:  Pain, Increased fascial restricitons, Increased muscle spasms, Hypomobility, Decreased strength, Decreased mobility, Decreased range of motion, Decreased activity tolerance  Visit Diagnosis: 1. Chronic bilateral low back pain, unspecified whether sciatica present   2. Muscle weakness (generalized)   3. Other symptoms and signs involving the musculoskeletal system        Problem List Patient Active Problem List   Diagnosis Date Noted  . Lumbar spinal stenosis 02/05/2019  . Left shoulder pain 05/02/2016  . Wheeze 09/16/2015  . Cough 09/16/2015  . Acute bronchitis 09/16/2015  . History of renal carcinoma 05/14/2015  . Vulvar ulceration 06/22/2014  . End stage renal disease ([place]) 04/09/2014  . Mechanical complication of other vascular device, implant, and graft 04/09/2014  . Cervical spondylosis 10/10/2013  . Aortic insufficiency 08/20/2013  . Hyperglycemia 12/14/2012  . SVT (supraventricular tachycardia) ([place]) 12/13/2012  . Uremia 12/13/2012  . Chronic kidney disease (CKD), stage IV (severe) ([place]) 12/07/2012  . Bradycardia 12/07/2012  . VITAMIN D DEFICIENCY 09/08/2009  . CHRON GLOMERULONEPHRIT W/LES MEMBRANOUS GLN 09/08/2009  . FATIGUE 09/08/2009  . DIVERTICULOSIS, COLON 09/26/2008  . OSTEOPENIA 09/26/2008  . COLONIC POLYPS, HX OF 09/26/2008  . Hypothyroidism 10/02/2007  . OTHER AND UNSPECIFIED HYPERLIPIDEMIA 10/02/2007  . BENIGN POSITIONAL VERTIGO 10/02/2007  . Unspecified essential hypertension  10/02/2007  . Gout, unspecified 03/26/2007  . DISORDER, DYSMETABOLIC SYNDROME X 51/70/0174  . HX, PERSONAL, MALIGNANCY, KIDNEY 03/26/2007  . OSTEOARTHRITIS 12/26/2006    Mariaceleste Herrera Nilda Simmer PT, MPH  02/12/2019, 11:04 AM  [facility] [place], [place], [address] Phone: [phone]   Fax:  [phone]  Name: Tabitha Green MRN: [id number] Date of Birth: 24-Mar-1946

## 2019-02-13 DIAGNOSIS — I129 Hypertensive chronic kidney disease with stage 1 through stage 4 chronic kidney disease, or unspecified chronic kidney disease: Secondary | ICD-10-CM | POA: Diagnosis not present

## 2019-02-13 DIAGNOSIS — Z940 Kidney transplant status: Secondary | ICD-10-CM | POA: Diagnosis not present

## 2019-02-13 LAB — CBC AND DIFFERENTIAL
Hemoglobin: 14 (ref 12.0–16.0)
Platelets: 268 (ref 150–399)
WBC: 7.4

## 2019-02-13 LAB — BASIC METABOLIC PANEL
BUN: 21 (ref 4–21)
Creatinine: 1.3 — AB (ref 0.5–1.1)
Potassium: 4.3 (ref 3.4–5.3)
Sodium: 139 (ref 137–147)

## 2019-02-18 ENCOUNTER — Other Ambulatory Visit: Payer: Self-pay

## 2019-02-18 ENCOUNTER — Encounter: Payer: Self-pay | Admitting: Family Medicine

## 2019-02-18 ENCOUNTER — Telehealth: Payer: Self-pay | Admitting: Family Medicine

## 2019-02-18 ENCOUNTER — Ambulatory Visit (INDEPENDENT_AMBULATORY_CARE_PROVIDER_SITE_OTHER): Payer: Medicare Other | Admitting: Family Medicine

## 2019-02-18 VITALS — BP 118/79 | HR 81 | Temp 98.1°F | Resp 16 | Ht 65.0 in | Wt 243.4 lb

## 2019-02-18 DIAGNOSIS — E039 Hypothyroidism, unspecified: Secondary | ICD-10-CM

## 2019-02-18 DIAGNOSIS — I129 Hypertensive chronic kidney disease with stage 1 through stage 4 chronic kidney disease, or unspecified chronic kidney disease: Secondary | ICD-10-CM

## 2019-02-18 DIAGNOSIS — Z0001 Encounter for general adult medical examination with abnormal findings: Secondary | ICD-10-CM

## 2019-02-18 DIAGNOSIS — I10 Essential (primary) hypertension: Secondary | ICD-10-CM

## 2019-02-18 DIAGNOSIS — Z0000 Encounter for general adult medical examination without abnormal findings: Secondary | ICD-10-CM | POA: Diagnosis not present

## 2019-02-18 DIAGNOSIS — N189 Chronic kidney disease, unspecified: Secondary | ICD-10-CM

## 2019-02-18 DIAGNOSIS — D631 Anemia in chronic kidney disease: Secondary | ICD-10-CM

## 2019-02-18 DIAGNOSIS — Z940 Kidney transplant status: Secondary | ICD-10-CM | POA: Diagnosis not present

## 2019-02-18 DIAGNOSIS — F411 Generalized anxiety disorder: Secondary | ICD-10-CM

## 2019-02-18 LAB — CBC WITH DIFFERENTIAL/PLATELET
Basophils Absolute: 0 10*3/uL (ref 0.0–0.1)
Basophils Relative: 0.5 % (ref 0.0–3.0)
Eosinophils Absolute: 0.3 10*3/uL (ref 0.0–0.7)
Eosinophils Relative: 4.8 % (ref 0.0–5.0)
HCT: 40.5 % (ref 36.0–46.0)
Hemoglobin: 13.4 g/dL (ref 12.0–15.0)
Lymphocytes Relative: 26.9 % (ref 12.0–46.0)
Lymphs Abs: 1.7 10*3/uL (ref 0.7–4.0)
MCHC: 33.1 g/dL (ref 30.0–36.0)
MCV: 92.8 fl (ref 78.0–100.0)
Monocytes Absolute: 0.5 10*3/uL (ref 0.1–1.0)
Monocytes Relative: 7.4 % (ref 3.0–12.0)
Neutro Abs: 3.8 10*3/uL (ref 1.4–7.7)
Neutrophils Relative %: 60.4 % (ref 43.0–77.0)
Platelets: 247 10*3/uL (ref 150.0–400.0)
RBC: 4.36 Mil/uL (ref 3.87–5.11)
RDW: 13.8 % (ref 11.5–15.5)
WBC: 6.3 10*3/uL (ref 4.0–10.5)

## 2019-02-18 LAB — COMPREHENSIVE METABOLIC PANEL
ALT: 13 U/L (ref 0–35)
AST: 13 U/L (ref 0–37)
Albumin: 4 g/dL (ref 3.5–5.2)
Alkaline Phosphatase: 77 U/L (ref 39–117)
BUN: 24 mg/dL — ABNORMAL HIGH (ref 6–23)
CO2: 27 mEq/L (ref 19–32)
Calcium: 9.6 mg/dL (ref 8.4–10.5)
Chloride: 102 mEq/L (ref 96–112)
Creatinine, Ser: 1.21 mg/dL — ABNORMAL HIGH (ref 0.40–1.20)
GFR: 43.56 mL/min — ABNORMAL LOW (ref >60.00)
Glucose, Bld: 97 mg/dL (ref 70–99)
Potassium: 3.9 mEq/L (ref 3.5–5.1)
Sodium: 139 mEq/L (ref 135–145)
Total Bilirubin: 0.6 mg/dL (ref 0.2–1.2)
Total Protein: 6.3 g/dL (ref 6.0–8.3)

## 2019-02-18 LAB — LIPID PANEL
Cholesterol: 167 mg/dL (ref 0–200)
HDL: 44.9 mg/dL (ref >39.00)
LDL Cholesterol: 98 mg/dL (ref 0–99)
NonHDL: 122.42
Total CHOL/HDL Ratio: 4
Triglycerides: 124 mg/dL (ref 0.0–149.0)
VLDL: 24.8 mg/dL (ref 0.0–40.0)

## 2019-02-18 LAB — TSH: TSH: 0.94 u[IU]/mL (ref 0.35–4.50)

## 2019-02-18 NOTE — Progress Notes (Signed)
Office Note 02/18/2019  CC:  Chief Complaint  Patient presents with  . Annual Exam    pt is fasting    HPI:  Tabitha Green Tabitha Green is a 73 y.o. White female who is here for annual health maintenance exam. Feeling well, no acute complaints.  Has some back pain issues that she will be starting PT for (mild multilevel DDD lumbar).  She goes in 3 d to see her local nephrologist. Home bp's (about 10 checks) show some perfectly normal and some 150s/90s.  HR 70s.  She had a good time with family in [place], [place] recently.  Anxiety still a problem for her, has taken ativan prn, most recent dose yesterday, says it does help.  Past Medical History:  Diagnosis Date  . Anemia of chronic kidney failure    r/t kidney function- receives Procrit  . Anxiety   . Cancer ([place])   . Chronic renal insufficiency, stage III (moderate) (HCC) 10/2015   Post-transplant Cr 1.3, GFR 41 as of June 2017 f/u w/ [facility]  . CMV infection [facility]) summer 2017   Valcyte per ID/Renal transplant team  . Diverticulosis    a. 05/2012 colonoscopy  . Erosive lichen planus of vulva    Topical steroids (managed by Dr. Mila Palmer [facility] via [facility].  . ESRD (end stage renal disease) ([place])    began dialysis 2014--followed by Dr. [name].  Received deceased donor kidney transplant 10/2015.  [name] FSGS (focal segmental glomerulosclerosis)    right kidney; hx of left renal cell cancer and got nephrectomy 1993.  [name] GERD (gastroesophageal reflux disease)   . Gout   . Heart murmur    (Diastolic) ECHO 0/0174 showed that this murmur is coming from pt's R arm AV fistula  . Hiatal hernia   . History of renal cell cancer 1993   Left nephrectomy  . Hyperlipidemia   . Hypertension   . Hypothyroidism   . Impingement syndrome of left shoulder 04/2016   Dr. Christy Sartorius to PT  . Metatarsal fracture, pathologic 01/2018   Right; orthocarolina-->post op shoe continued, f/u x-ray  planned.  . Osteoarthritis of left knee 08/2017   severe, diffuse, tricompartmental.  Responded to steroid injection.    . Osteoporosis 06/07/2016   DEXA T-score of -3.1.  Fosamax planned 2017 but dental work prevented start..  Pathologic toe fx 12/2017--repeat DEXA 08/2018, T-score -3.3 radius.  . Renal transplant recipient 11/16/2015   Baseline Cr as of 02/2018= 1.0-1.2.  . SVT (supraventricular tachycardia) ([place])   . Vitamin B12 deficiency 12/2018   Starting replacement as of 12/11/2018    Past Surgical History:  Procedure Laterality Date  . AV FISTULA PLACEMENT Right    Right arm: aneurismal dilatation 2017 being followed by CV surgeons  . CARDIOVASCULAR STRESS TEST     03/10/15 ETT ([facility]): Exercise ECG negative at 83% max predicted HR.   [name] COLONOSCOPY  2003; 05/2012   diverticulosis, no polyps.  Recall 10 yrs  . colonoscopy with polypectomy     Dr Henrene Pastor  . DEXA  06/07/2016; 08/22/2018   2017 T-score -3.1.  2020 T score -3.3  . KIDNEY TRANSPLANT  11/16/15   Deceased donor kidney transplant, with Thymoglobulin induction  . LIGATION OF ARTERIOVENOUS  FISTULA Right 02/14/2017   Procedure: LIGATION OF ARTERIOVENOUS  FISTULA;  Surgeon: Angelia Mould, MD;  Location: [place];  Service: Vascular;  Laterality: Right;  . NEPHRECTOMY  1993   for malignancy- left  .  RENAL BIOPSY     right  . RESECTION OF ARTERIOVENOUS FISTULA ANEURYSM Right 02/14/2017   Procedure: EXCISION OF RIGHT BRACHIOCEPHALIC ARTERIOVENOUS FISTULA ANEURYSM;  Surgeon: Angelia Mould, MD;  Location: [place];  Service: Vascular;  Laterality: Right;  . TEE WITHOUT CARDIOVERSION N/A 08/27/2013   Procedure: TRANSESOPHAGEAL ECHOCARDIOGRAM (TEE);  Surgeon: [name], MD;  [facility];  Service: Cardiovascular;  Laterality: N/A;  . TOTAL ABDOMINAL HYSTERECTOMY W/ BILATERAL SALPINGOOPHORECTOMY  1997   fibroids  . TRANSTHORACIC ECHOCARDIOGRAM     03/10/15 echo ([facility]): LV cavity  normal in size, focal basal hypertrophy, normal systolic function, EF 55% (visual est). Normal wall motion, no regional wall motion abnormalities. Mild diastolic dysfunction with normal LA chamber size. No significant valve stenosis or regurgitation.  . VULVA / PERINEUM BIOPSY  2015    Family History  Problem Relation Age of Onset  . Deep vein thrombosis Mother        post thyroid surgery  . Hypertension Mother   . Heart attack Father 27       deceased  . Hypertension Father   . Cancer Sister        breast  . Cancer Paternal Aunt        pancreatic  . Diabetes Maternal Grandfather   . Stroke Paternal Grandmother        in 34s  . Colon cancer Neg Hx   . Esophageal cancer Neg Hx   . Stomach cancer Neg Hx   . Rectal cancer Neg Hx     Social History   Socioeconomic History  . Marital status: Married    Spouse name: Not on file  . Number of children: 2  . Years of education: Not on file  . Highest education level: Not on file  Occupational History  . Occupation: Retired  Scientific laboratory technician  . Financial resource strain: Not on file  . Food insecurity    Worry: Not on file    Inability: Not on file  . Transportation needs    Medical: Not on file    Non-medical: Not on file  Tobacco Use  . Smoking status: Never Smoker  . Smokeless tobacco: Never Used  Substance and Sexual Activity  . Alcohol use: Yes    Comment: rarely  . Drug use: No  . Sexual activity: Not Currently    Birth control/protection: Surgical  Lifestyle  . Physical activity    Days per week: Not on file    Minutes per session: Not on file  . Stress: Not on file  Relationships  . Social Herbalist on phone: Not on file    Gets together: Not on file    Attends religious service: Not on file    Active member of club or organization: Not on file    Attends meetings of clubs or organizations: Not on file    Relationship status: Not on file  . Intimate partner violence    Fear of current or ex  partner: Not on file    Emotionally abused: Not on file    Physically abused: Not on file    Forced sexual activity: Not on file  Other Topics Concern  . Not on file  Social History Narrative   Lives in [place] with husband.  Retired.  Previously worked in [employer] @ [employer]   No T/A/Ds.    Outpatient Medications Prior to Visit  Medication Sig Dispense Refill  . acetaminophen (  TYLENOL) 500 MG tablet Take 500-750 mg by mouth every 6 (six) hours as needed for pain.    [name] aspirin EC 81 MG EC tablet Take 1 tablet (81 mg total) by mouth daily.    Skipper Cliche SALINE NASAL DROPS NA Place 2 sprays into both nostrils daily.    . Camphor-Eucalyptus-Menthol (VICKS VAPORUB EX) Apply 1 application topically daily.    . cyanocobalamin (,VITAMIN B-12,) 1000 MCG/ML injection 1 ml IM qd x 7d, then 1 ml IM q7d x 4 11 mL 0  . doxazosin (CARDURA) 2 MG tablet Take 2 mg by mouth 2 (two) times daily.     . fluticasone (FLONASE) 50 MCG/ACT nasal spray one spray by Both Nostrils route daily.    . hydrochlorothiazide (HYDRODIURIL) 25 MG tablet TAKE 1 TABLET BY MOUTH DAILY 30 tablet 2  . levothyroxine (SYNTHROID, LEVOTHROID) 125 MCG tablet Take 1 tablet (125 mcg total) by mouth daily. 90 tablet 1  . LORazepam (ATIVAN) 0.5 MG tablet Take 1 tablet (0.5 mg total) by mouth 2 (two) times daily as needed for anxiety. 30 tablet 1  . losartan (COZAAR) 100 MG tablet Take 1 tablet (100 mg total) by mouth daily. 90 tablet 3  . Magnesium Oxide 500 MG TABS Take 1 tablet by mouth daily.    . meclizine (ANTIVERT) 25 MG tablet Take 1 tablet (25 mg total) by mouth 3 (three) times daily as needed for dizziness or nausea. 20 tablet 0  . mycophenolate (MYFORTIC) 360 MG TBEC EC tablet Take 360 mg by mouth 2 (two) times daily.    . silver sulfADIAZINE (SILVADENE) 1 % cream Apply 1 application topically daily. Mixes with Triamcinolone cream    . SODIUM BICARBONATE PO Take 650 mg by mouth 2 (two) times daily. Takes 2 in AM and 2 at bedtime     . sulfamethoxazole-trimethoprim (BACTRIM) 400-80 MG tablet     . tacrolimus (PROGRAF) 1 MG capsule Take 4 mg by mouth 2 (two) times daily. Pt takes 3 in the morning and 3 at night.    . triamcinolone (NASACORT ALLERGY 24HR) 55 MCG/ACT AERO nasal inhaler Place 2 sprays into the nose daily.    [name] triamcinolone cream (KENALOG) 0.1 % Apply 1 application topically daily.    . verapamil (CALAN-SR) 120 MG CR tablet 1 tab po every afternoon 90 tablet 0  . verapamil (CALAN-SR) 240 MG CR tablet 1 tab po every morning 90 tablet 1  . predniSONE (DELTASONE) 50 MG tablet One tab PO daily for 5 days. (Patient not taking: Reported on 02/18/2019) 5 tablet 0   No facility-administered medications prior to visit.     Allergies  Allergen Reactions  . Cefaclor Rash  . Naproxen Rash  . Enalapril Maleate Cough    Vasotec  . Metoprolol Tartrate Rash    On legs    ROS Review of Systems  Constitutional: Negative for appetite change, chills, fatigue and fever.  HENT: Negative for congestion, dental problem, ear pain and sore throat.   Eyes: Negative for discharge, redness and visual disturbance.  Respiratory: Negative for cough, chest tightness, shortness of breath and wheezing.   Cardiovascular: Negative for chest pain, palpitations and leg swelling.  Gastrointestinal: Negative for abdominal pain, blood in stool, diarrhea, nausea and vomiting.  Genitourinary: Negative for difficulty urinating, dysuria, flank pain, frequency, hematuria and urgency.  Musculoskeletal: Negative for arthralgias, back pain, joint swelling, myalgias and neck stiffness.  Skin: Negative for pallor and rash.  Neurological: Negative for dizziness, speech difficulty,  weakness and headaches.  Hematological: Negative for adenopathy. Does not bruise/bleed easily.  Psychiatric/Behavioral: Negative for confusion and sleep disturbance. The patient is not nervous/anxious.     PE; Blood pressure 118/79, pulse 81, temperature 98.1 F (36.7  C), temperature source Temporal, resp. rate 16, height 5\' 5"  (1.651 m), weight 243 lb 6.4 oz (110.4 kg), SpO2 95 %. Body mass index is 40.5 kg/m. Exam chaperoned by Deveron Furlong, CMA.  Gen: Alert, well appearing.  Patient is oriented to person, place, time, and situation. AFFECT: pleasant, lucid thought and speech. ENT: Ears: EACs clear, normal epithelium.  TMs with good light reflex and landmarks bilaterally.  Eyes: no injection, icteris, swelling, or exudate.  EOMI, PERRLA. Nose: no drainage or turbinate edema/swelling.  No injection or focal lesion.  Mouth: lips without lesion/swelling.  Oral mucosa pink and moist.  Dentition intact and without obvious caries or gingival swelling.  Oropharynx without erythema, exudate, or swelling.  Neck: supple/nontender.  No LAD, mass, or TM.  Carotid pulses 2+ bilaterally, without bruits. CV: RRR, no m/r/g.   LUNGS: CTA bilat, nonlabored resps, good aeration in all lung fields. ABD: soft, NT, ND, BS normal.  No hepatospenomegaly or mass.  No bruits. EXT: no clubbing, cyanosis, or edema.  Musculoskeletal: no joint swelling, erythema, warmth, or tenderness.  ROM of all joints intact. Skin - no sores or suspicious lesions or rashes or color changes   Pertinent labs:   Lab Results  Component Value Date   VITAMINB12 786 01/28/2019    Lab Results  Component Value Date   TSH 4.36 11/14/2018   Lab Results  Component Value Date   WBC 5.5 09/16/2015   HGB 12.6 02/14/2017   HCT 37.0 02/14/2017   MCV 94.8 09/16/2015   PLT 179 09/16/2015   Lab Results  Component Value Date   IRON 118 08/02/2012   TIBC 242 (L) 08/02/2012   FERRITIN 164 08/02/2012    Lab Results  Component Value Date   CREATININE 1.11 01/28/2019   BUN 22 01/28/2019   NA 137 01/28/2019   K 3.9 01/28/2019   CL 101 01/28/2019   CO2 27 01/28/2019   Lab Results  Component Value Date   ALT 21 12/14/2012   AST 24 12/14/2012   ALKPHOS 71 12/14/2012   BILITOT 0.3  12/14/2012   Lab Results  Component Value Date   CHOL 150 08/10/2017   Lab Results  Component Value Date   HDL 40.30 08/10/2017   Lab Results  Component Value Date   LDLCALC 85 08/10/2017   Lab Results  Component Value Date   TRIG 122.0 08/10/2017   Lab Results  Component Value Date   CHOLHDL 4 08/10/2017   Lab Results  Component Value Date   HGBA1C 5.4 02/23/2012     ASSESSMENT AND PLAN:   1) Health maintenance exam: Reviewed age and gender appropriate health maintenance issues (prudent diet, regular exercise, health risks of tobacco and excessive alcohol, use of seatbelts, fire alarms in home, use of sunscreen).  Also reviewed age and gender appropriate health screening as well as vaccine recommendations. Vaccines: UTD. Labs: CBC w/diff, CMET, TSH, FLP. Cervical ca screening: not candidate for cerv ca screening-->pt is s/p TAH w/BSO for fibroids. Breast ca screening: next mammogram due 08/2019. Colon ca screening: next colonoscopy recommended 2023.  2) CRI; pt is s/p renal transplant in 2017.  She will be seeing her local nephrologist for routine f/u in a few days.  3) She has HTN that I  think is highly variable along with her anxiety level.  I will not change med for fear of overtreating (some of her bp's are perfectly normal).    4) I did recommend she consider at daily antidepressant to help treat her chronic anxiety. She could still use her ativan prn--which is helping well but she doesn't use it much and has a pretty healthy fear of being dependent on this med. She will think about the antidepressant idea for now. Will get CSC if she is still taking the ativan prn at the time of next routine f/u in 3 mo.  5) Vit B12 def: next inj due in about 10d.  We'll recheck level in a few months. An After Visit Summary was printed and given to the patient.  FOLLOW UP:  No follow-ups on file.  Signed:  Crissie Sickles, MD           02/18/2019

## 2019-02-18 NOTE — Patient Instructions (Signed)
Health Maintenance, Female Adopting a healthy lifestyle and getting preventive care are important in promoting health and wellness. Ask your health care provider about:  The right schedule for you to have regular tests and exams.  Things you can do on your own to prevent diseases and keep yourself healthy. What should I know about diet, weight, and exercise? Eat a healthy diet   Eat a diet that includes plenty of vegetables, fruits, low-fat dairy products, and lean protein.  Do not eat a lot of foods that are high in solid fats, added sugars, or sodium. Maintain a healthy weight Body mass index (BMI) is used to identify weight problems. It estimates body fat based on height and weight. Your health care provider can help determine your BMI and help you achieve or maintain a healthy weight. Get regular exercise Get regular exercise. This is one of the most important things you can do for your health. Most adults should:  Exercise for at least 150 minutes each week. The exercise should increase your heart rate and make you sweat (moderate-intensity exercise).  Do strengthening exercises at least twice a week. This is in addition to the moderate-intensity exercise.  Spend less time sitting. Even light physical activity can be beneficial. Watch cholesterol and blood lipids Have your blood tested for lipids and cholesterol at 73 years of age, then have this test every 5 years. Have your cholesterol levels checked more often if:  Your lipid or cholesterol levels are high.  You are older than 73 years of age.  You are at high risk for heart disease. What should I know about cancer screening? Depending on your health history and family history, you may need to have cancer screening at various ages. This may include screening for:  Breast cancer.  Cervical cancer.  Colorectal cancer.  Skin cancer.  Lung cancer. What should I know about heart disease, diabetes, and high blood  pressure? Blood pressure and heart disease  High blood pressure causes heart disease and increases the risk of stroke. This is more likely to develop in people who have high blood pressure readings, are of African descent, or are overweight.  Have your blood pressure checked: ? Every 3-5 years if you are 18-39 years of age. ? Every year if you are 40 years old or older. Diabetes Have regular diabetes screenings. This checks your fasting blood sugar level. Have the screening done:  Once every three years after age 40 if you are at a normal weight and have a low risk for diabetes.  More often and at a younger age if you are overweight or have a high risk for diabetes. What should I know about preventing infection? Hepatitis B If you have a higher risk for hepatitis B, you should be screened for this virus. Talk with your health care provider to find out if you are at risk for hepatitis B infection. Hepatitis C Testing is recommended for:  Everyone born from 1945 through 1965.  Anyone with known risk factors for hepatitis C. Sexually transmitted infections (STIs)  Get screened for STIs, including gonorrhea and chlamydia, if: ? You are sexually active and are younger than 73 years of age. ? You are older than 73 years of age and your health care provider tells you that you are at risk for this type of infection. ? Your sexual activity has changed since you were last screened, and you are at increased risk for chlamydia or gonorrhea. Ask your health care provider if   you are at risk.  Ask your health care provider about whether you are at high risk for HIV. Your health care provider may recommend a prescription medicine to help prevent HIV infection. If you choose to take medicine to prevent HIV, you should first get tested for HIV. You should then be tested every 3 months for as long as you are taking the medicine. Pregnancy  If you are about to stop having your period (premenopausal) and  you may become pregnant, seek counseling before you get pregnant.  Take 400 to 800 micrograms (mcg) of folic acid every day if you become pregnant.  Ask for birth control (contraception) if you want to prevent pregnancy. Osteoporosis and menopause Osteoporosis is a disease in which the bones lose minerals and strength with aging. This can result in bone fractures. If you are 65 years old or older, or if you are at risk for osteoporosis and fractures, ask your health care provider if you should:  Be screened for bone loss.  Take a calcium or vitamin D supplement to lower your risk of fractures.  Be given hormone replacement therapy (HRT) to treat symptoms of menopause. Follow these instructions at home: Lifestyle  Do not use any products that contain nicotine or tobacco, such as cigarettes, e-cigarettes, and chewing tobacco. If you need help quitting, ask your health care provider.  Do not use street drugs.  Do not share needles.  Ask your health care provider for help if you need support or information about quitting drugs. Alcohol use  Do not drink alcohol if: ? Your health care provider tells you not to drink. ? You are pregnant, may be pregnant, or are planning to become pregnant.  If you drink alcohol: ? Limit how much you use to 0-1 drink a day. ? Limit intake if you are breastfeeding.  Be aware of how much alcohol is in your drink. In the [place] one drink equals one 12 oz bottle of beer (355 mL), one 5 oz glass of wine (148 mL), or one 1 oz glass of hard liquor (44 mL). General instructions  Schedule regular health, dental, and eye exams.  Stay current with your vaccines.  Tell your health care provider if: ? You often feel depressed. ? You have ever been abused or do not feel safe at home. Summary  Adopting a healthy lifestyle and getting preventive care are important in promoting health and wellness.  Follow your health care provider's instructions about healthy  diet, exercising, and getting tested or screened for diseases.  Follow your health care provider's instructions on monitoring your cholesterol and blood pressure. This information is not intended to replace advice given to you by your health care provider. Make sure you discuss any questions you have with your health care provider. Document Released: 02/07/2011 Document Revised: 07/18/2018 Document Reviewed: 07/18/2018 Elsevier Patient Education  2020 [employer] Inc[employer]  

## 2019-02-18 NOTE — Telephone Encounter (Signed)
I signed off on pt's labs from today 02/18/19 before I did a result note. Pls call her and tell her that all her labs area stable. No new recommendations.-thx

## 2019-02-19 ENCOUNTER — Encounter: Payer: Self-pay | Admitting: Rehabilitative and Restorative Service Providers"

## 2019-02-19 ENCOUNTER — Ambulatory Visit (INDEPENDENT_AMBULATORY_CARE_PROVIDER_SITE_OTHER): Payer: Medicare Other | Admitting: Rehabilitative and Restorative Service Providers"

## 2019-02-19 DIAGNOSIS — M6281 Muscle weakness (generalized): Secondary | ICD-10-CM

## 2019-02-19 DIAGNOSIS — M545 Low back pain: Secondary | ICD-10-CM

## 2019-02-19 DIAGNOSIS — R29898 Other symptoms and signs involving the musculoskeletal system: Secondary | ICD-10-CM | POA: Diagnosis not present

## 2019-02-19 DIAGNOSIS — G8929 Other chronic pain: Secondary | ICD-10-CM | POA: Diagnosis not present

## 2019-02-19 NOTE — Patient Instructions (Addendum)
SUPINE Tips A    Being in the supine position means to be lying on the back. Lying on the back is the position of least compression on the bones and discs of the spine, and helps to re-align the natural curves of the back. Arms out to side in a T or up closer to the ears - hold 2-5 minutes with arms resting on the bed.     Bent Leg Lift (Hook-Lying)    Tighten core and slowly raise right leg ___10-12_ inches from floor. Keep trunk rigid. Hold _2___ seconds. Repeat __10__ times per set. Do _1-2___ sets per session. Do __1__ sessions per day.    Combination (Hook-Lying)    Tighten core and slowly raise left leg and lower opposite arm over head. Keep trunk rigid. Repeat __10__ times per set. Do __1-2__ sets per session. Do __1__ sessions per day.    Bridge    Lie back, legs bent. Inhale, pressing hips up. Keeping core tight, lengthen lower back. Hold 5 seconds. Exhale, lower slowly. Repeat __10-20__ times. Do _1___ sessions per day.   Axial Extension (Chin Tuck) can do in sitting and lying on your back     Pull chin in and lengthen back of neck. Hold _10-15___ seconds while counting out loud. Repeat _5___ times. Do _several___ sessions per day.     Trigger Point Dry Needling  . What is Trigger Point Dry Needling (DN)? o DN is a physical therapy technique used to treat muscle pain and dysfunction. Specifically, DN helps deactivate muscle trigger points (muscle knots).  o A thin filiform needle is used to penetrate the skin and stimulate the underlying trigger point. The goal is for a local twitch response (LTR) to occur and for the trigger point to relax. No medication of any kind is injected during the procedure.   . What Does Trigger Point Dry Needling Feel Like?  o The procedure feels different for each individual patient. Some patients report that they do not actually feel the needle enter the skin and overall the process is not painful. Very mild bleeding may occur.  However, many patients feel a deep cramping in the muscle in which the needle was inserted. This is the local twitch response.   [name] How Will I feel after the treatment? o Soreness is normal, and the onset of soreness may not occur for a few hours. Typically this soreness does not last longer than two days.  o Bruising is uncommon, however; ice can be used to decrease any possible bruising.  o In rare cases feeling tired or nauseous after the treatment is normal. In addition, your symptoms may get worse before they get better, this period will typically not last longer than 24 hours.   . What Can I do After My Treatment? o Increase your hydration by drinking more water for the next 24 hours. o You may place ice or heat on the areas treated that have become sore, however, do not use heat on inflamed or bruised areas. Heat often brings more relief post needling. o You can continue your regular activities, but vigorous activity is not recommended initially after the treatment for 24 hours. o DN is best combined with other physical therapy such as strengthening, stretching, and other therapies.

## 2019-02-19 NOTE — Therapy (Signed)
[place], [place], [address] Phone: [phone]   Fax:  [phone]  Physical Therapy Treatment  Patient Details  Name: Tabitha Green MRN: [id number] Date of Birth: Jan 06, 1946 Referring Provider (PT): Dr Dianah Field    Encounter Date: 02/19/2019  PT End of Session - 02/19/19 0901    Visit Number  2    Number of Visits  12    Date for PT Re-Evaluation  03/26/19    PT Start Time  0900    PT Stop Time  0948    PT Time Calculation (min)  48 min    Activity Tolerance  Patient tolerated treatment well       Past Medical History:  Diagnosis Date  . Anemia of chronic kidney failure    r/t kidney function- receives Procrit  . Anxiety   . Cancer ([place])   . Chronic renal insufficiency, stage III (moderate) (HCC) 10/2015   Post-transplant Cr 1.3, GFR 41 as of June 2017 f/u w/ [facility]  . CMV infection [facility]) summer 2017   Valcyte per ID/Renal transplant team  . Diverticulosis    a. 05/2012 colonoscopy  . Erosive lichen planus of vulva    Topical steroids (managed by Dr. Mila [facility]  . ESRD (end stage renal disease) ([place])    began dialysis 2014--followed by Dr. [name].  Received deceased donor kidney transplant 10/2015.  [name] FSGS (focal segmental glomerulosclerosis)    right kidney; hx of left renal cell cancer and got nephrectomy 1993.  [name] GERD (gastroesophageal reflux disease)   . Gout   . Heart murmur    (Diastolic) ECHO 10/2353 showed that this murmur is coming from pt's R arm AV fistula  . Hiatal hernia   . History of renal cell cancer 1993   Left nephrectomy  . Hyperlipidemia   . Hypertension   . Hypothyroidism   . Impingement syndrome of left shoulder 04/2016   Dr. Christy Sartorius to PT  . Metatarsal fracture, pathologic 01/2018   Right; orthocarolina-->post op shoe continued, f/u x-ray planned.  . Osteoarthritis of  left knee 08/2017   severe, diffuse, tricompartmental.  Responded to steroid injection.    . Osteoporosis 06/07/2016   DEXA T-score of -3.1.  Fosamax planned 2017 but dental work prevented start..  Pathologic toe fx 12/2017--repeat DEXA 08/2018, T-score -3.3 radius.  . Renal transplant recipient 11-10-2015   Baseline Cr as of 02/2018= 1.0-1.2.  . SVT (supraventricular tachycardia) ([place])   . Vitamin B12 deficiency 12/2018   Starting replacement as of 12/11/2018    Past Surgical History:  Procedure Laterality Date  . AV FISTULA PLACEMENT Right    Right arm: aneurismal dilatation 2017 being followed by CV surgeons  . CARDIOVASCULAR STRESS TEST     03/10/15 ETT ([facility]): Exercise ECG negative at 83% max predicted HR.   [name] COLONOSCOPY  2003; 05/2012   diverticulosis, no polyps.  Recall 10 yrs  . colonoscopy with polypectomy     Dr Henrene Pastor  . DEXA  06/07/2016; 08/22/2018   2017 T-score -3.1.  2020 T score -3.3  . KIDNEY TRANSPLANT  11-10-15   Deceased donor kidney transplant, with Thymoglobulin induction  . LIGATION OF ARTERIOVENOUS  FISTULA Right 02/14/2017   Procedure: LIGATION OF ARTERIOVENOUS  FISTULA;  Surgeon: Angelia Mould, MD;  Location: [place];  Service: Vascular;  Laterality: Right;  . NEPHRECTOMY  1993   for malignancy- left  .  RENAL BIOPSY     right  . RESECTION OF ARTERIOVENOUS FISTULA ANEURYSM Right 02/14/2017   Procedure: EXCISION OF RIGHT BRACHIOCEPHALIC ARTERIOVENOUS FISTULA ANEURYSM;  Surgeon: Angelia Mould, MD;  Location: [place];  Service: Vascular;  Laterality: Right;  . TEE WITHOUT CARDIOVERSION N/A 08/27/2013   Procedure: TRANSESOPHAGEAL ECHOCARDIOGRAM (TEE);  Surgeon: [name], MD;  Location: [facility] ENDOSCOPY;  Service: Cardiovascular;  Laterality: N/A;  . TOTAL ABDOMINAL HYSTERECTOMY W/ BILATERAL SALPINGOOPHORECTOMY  1997   fibroids  . TRANSTHORACIC ECHOCARDIOGRAM     03/10/15 echo ([facility]): LV cavity normal in size, focal basal  hypertrophy, normal systolic function, EF 91% (visual est). Normal wall motion, no regional wall motion abnormalities. Mild diastolic dysfunction with normal LA chamber size. No significant valve stenosis or regurgitation.  . VULVA / PERINEUM BIOPSY  2015    There were no vitals filed for this visit.  Subjective Assessment - 02/19/19 0902    Subjective  Patient reports that her back still hurts. She was at the pool yesterday and is sore from that today. She is trying to avoid sitting propped up on pillows - but still does sit propped some.    Currently in Pain?  Yes    Pain Score  4     Pain Location  Back    Pain Orientation  Right;Left;Lower;Mid    Pain Descriptors / Indicators  Nagging    Pain Type  Chronic pain    Pain Onset  More than a month ago    Pain Frequency  Constant                       [facility] Adult PT Treatment/Exercise - 02/19/19 0001      Lumbar Exercises: Stretches   Press Ups  10 reps   2-3 sec    Quad Stretch  Right;Left;2 reps;30 seconds   prone with strap      Lumbar Exercises: Seated   Sit to Stand  5 reps   slow eccentric stand to sit with core tight      Lumbar Exercises: Supine   Bent Knee Raise  10 reps    Dead Bug  10 reps;2 seconds   difficulty with coordination    Bridge  10 reps;5 seconds    Other Supine Lumbar Exercises  4 part core 10 sec x 10 VC required     Other Supine Lumbar Exercises  chinb tuck 10 sec x 5       Moist Heat Therapy   Number Minutes Moist Heat  15 Minutes    Moist Heat Location  Lumbar Spine      Electrical Stimulation   Electrical Stimulation Location  bilat lumbar     Electrical Stimulation Action  IFC    Electrical Stimulation Parameters  to tolerance    Electrical Stimulation Goals  Pain;Tone      Manual Therapy   Manual therapy comments  pt prone     Soft tissue mobilization  working through the Lt posterior hip - piriformis and gluts     Myofascial Release  Lt posterior hip        Trigger  Point Dry Needling - 02/19/19 0001    Consent Given?  Yes    Education Handout Provided  Yes    Other Dry Needling  Lt    Gluteus Minimus Response  Palpable increased muscle length    Gluteus Medius Response  Palpable increased muscle length  Gluteus Maximus Response  Palpable increased muscle length    Piriformis Response  Palpable increased muscle length           PT Education - 02/19/19 0928    Education Details  HEP DN    Person(s) Educated  Patient    Methods  Explanation;Demonstration;Tactile cues;Verbal cues;Handout    Comprehension  Verbalized understanding;Returned demonstration;Verbal cues required;Tactile cues required          PT Long Term Goals - 02/12/19 1057      PT LONG TERM GOAL #1   Title  Improve posture and alignment with patient to demonstrate improved upright posture with demo of good body mechanics for positioning and positional changes 03/26/2019    Time  6    Period  Weeks    Status  New      PT LONG TERM GOAL #2   Title  Increase LE strength and core stability with patient to demo 5-/5 to 5/5 strength LE's 03/26/2019    Time  6    Period  Weeks    Status  New      PT LONG TERM GOAL #3   Title  Decrease pain by 50-75% with functional activity allowing patient to stand and walk for 10-15 min with minimal difficulty 03/26/2019    Time  6    Period  Weeks    Status  New      PT LONG TERM GOAL #4   Title  03/26/2019    Time  6    Period  Weeks    Status  New      PT LONG TERM GOAL #5   Title  Improve FOTO to </= 46% limitation 03/26/2019    Time  6    Period  Weeks    Status  New            Plan - 02/19/19 0902    Clinical Impression Statement  Patient continues to have pain. She has tried to avoid propping but still props some to watch TV. Working on some of her exercises. Added exercises as tolerated today working on stretching and core stabilization. Trial of DN/manual work Lt posterior hip tolerated well with decreased muscular  tightness noted.    Stability/Clinical Decision Making  Stable/Uncomplicated    Rehab Potential  Good    PT Frequency  2x / week    PT Duration  6 weeks    PT Treatment/Interventions  Patient/family education;ADLs/Self Care Home Management;Cryotherapy;Electrical Stimulation;Iontophoresis 4mg /ml Dexamethasone;Moist Heat;Ultrasound;Dry needling;Manual techniques;Neuromuscular re-education;Therapeutic activities;Therapeutic exercise;Functional mobility training    PT Next Visit Plan  review HEP; progress with core stabilization and strengthening; LE strengthening; manual work and modalities as indicated    PT [place] and Agree with Plan of Care  Patient       Patient will benefit from skilled therapeutic intervention in order to improve the following deficits and impairments:  Pain, Increased fascial restricitons, Increased muscle spasms, Hypomobility, Decreased strength, Decreased mobility, Decreased range of motion, Decreased activity tolerance  Visit Diagnosis: 1. Chronic bilateral low back pain, unspecified whether sciatica present   2. Muscle weakness (generalized)   3. Other symptoms and signs involving the musculoskeletal system        Problem List Patient Active Problem List   Diagnosis Date Noted  . Lumbar spinal stenosis 02/05/2019  . Left shoulder pain 05/02/2016  . Wheeze 09/16/2015  . Cough 09/16/2015  . Acute bronchitis 09/16/2015  . History  of renal carcinoma 05/14/2015  . Vulvar ulceration 06/22/2014  . End stage renal disease ([place]) 04/09/2014  . Mechanical complication of other vascular device, implant, and graft 04/09/2014  . Cervical spondylosis 10/10/2013  . Aortic insufficiency 08/20/2013  . Hyperglycemia 12/14/2012  . SVT (supraventricular tachycardia) ([place]) 12/13/2012  . Uremia 12/13/2012  . Chronic kidney disease (CKD), stage IV (severe) ([place]) 12/07/2012  . Bradycardia 12/07/2012  . VITAMIN D DEFICIENCY 09/08/2009  .  CHRON GLOMERULONEPHRIT W/LES MEMBRANOUS GLN 09/08/2009  . FATIGUE 09/08/2009  . DIVERTICULOSIS, COLON 09/26/2008  . OSTEOPENIA 09/26/2008  . COLONIC POLYPS, HX OF 09/26/2008  . Hypothyroidism 10/02/2007  . OTHER AND UNSPECIFIED HYPERLIPIDEMIA 10/02/2007  . BENIGN POSITIONAL VERTIGO 10/02/2007  . Unspecified essential hypertension 10/02/2007  . Gout, unspecified 03/26/2007  . DISORDER, DYSMETABOLIC SYNDROME X 26/94/8546  . HX, PERSONAL, MALIGNANCY, KIDNEY 03/26/2007  . OSTEOARTHRITIS 12/26/2006    Celyn Nilda Simmer PT, MPH  02/19/2019, 9:44 AM  [facility] [place], [place], [address] Phone: [phone]   Fax:  [phone]  Name: Tabitha Green MRN: [id number] Date of Birth: 1945/09/16

## 2019-02-19 NOTE — Telephone Encounter (Signed)
LM for pt to call back or check MyChart regarding results.

## 2019-02-19 NOTE — Telephone Encounter (Signed)
MyChart message read.

## 2019-02-20 ENCOUNTER — Encounter: Payer: Self-pay | Admitting: Family Medicine

## 2019-02-20 NOTE — Progress Notes (Signed)
Pt with vit B12 deficiency.  Agree with vit B12 1000 mcg IM in office today. Signed:  Crissie Sickles, MD           02/20/2019

## 2019-02-22 DIAGNOSIS — Z940 Kidney transplant status: Secondary | ICD-10-CM | POA: Diagnosis not present

## 2019-02-22 DIAGNOSIS — N2581 Secondary hyperparathyroidism of renal origin: Secondary | ICD-10-CM | POA: Diagnosis not present

## 2019-02-22 DIAGNOSIS — N051 Unspecified nephritic syndrome with focal and segmental glomerular lesions: Secondary | ICD-10-CM | POA: Diagnosis not present

## 2019-02-22 DIAGNOSIS — I10 Essential (primary) hypertension: Secondary | ICD-10-CM | POA: Diagnosis not present

## 2019-02-22 DIAGNOSIS — B259 Cytomegaloviral disease, unspecified: Secondary | ICD-10-CM | POA: Diagnosis not present

## 2019-02-26 ENCOUNTER — Encounter: Payer: Self-pay | Admitting: Physical Therapy

## 2019-02-26 ENCOUNTER — Ambulatory Visit (INDEPENDENT_AMBULATORY_CARE_PROVIDER_SITE_OTHER): Payer: Medicare Other | Admitting: Physical Therapy

## 2019-02-26 ENCOUNTER — Other Ambulatory Visit: Payer: Self-pay

## 2019-02-26 DIAGNOSIS — M545 Low back pain: Secondary | ICD-10-CM | POA: Diagnosis not present

## 2019-02-26 DIAGNOSIS — R29898 Other symptoms and signs involving the musculoskeletal system: Secondary | ICD-10-CM | POA: Diagnosis not present

## 2019-02-26 DIAGNOSIS — M6281 Muscle weakness (generalized): Secondary | ICD-10-CM

## 2019-02-26 DIAGNOSIS — G8929 Other chronic pain: Secondary | ICD-10-CM | POA: Diagnosis not present

## 2019-02-26 NOTE — Therapy (Addendum)
[place], [place], [address] Phone: [phone]   Fax:  [phone]  Physical Therapy Treatment  Patient Details  Name: Tabitha Green MRN: [id number] Date of Birth: November 12, 1945 Referring Provider (PT): Dr Dianah Field    Encounter Date: 02/26/2019  PT End of Session - 02/26/19 0908    Visit Number  3    Number of Visits  12    Date for PT Re-Evaluation  03/26/19    PT Start Time  0903    PT Stop Time  0941    PT Time Calculation (min)  38 min    Activity Tolerance  Patient tolerated treatment well       Past Medical History:  Diagnosis Date  . Anemia of chronic kidney failure    r/t kidney function- receives Procrit  . Anxiety   . Cancer ([place])   . Chronic renal insufficiency, stage III (moderate) (HCC) 10/2015   Post-transplant Cr 1.3, GFR 41 as of June 2017 f/u w/ [facility]  . CMV infection [facility]) summer 2017   Valcyte per ID/Renal transplant team  . Diverticulosis    a. 05/2012 colonoscopy  . Erosive lichen planus of vulva    Topical steroids (managed by Dr. [facility]  . ESRD (end stage renal disease) ([place])    began dialysis 2014--followed by Dr. [name].  Received deceased donor kidney transplant 10/2015.  [name] FSGS (focal segmental glomerulosclerosis)    right kidney; hx of left renal cell cancer and got nephrectomy 1993.  [name] GERD (gastroesophageal reflux disease)   . Gout   . Heart murmur    (Diastolic) ECHO 08/3242 showed that this murmur is coming from pt's R arm AV fistula  . Hiatal hernia   . History of renal cell cancer 1993   Left nephrectomy  . Hyperlipidemia   . Hypertension   . Hypothyroidism   . Impingement syndrome of left shoulder 04/2016   Dr. Christy Sartorius to PT  . Lumbar spinal stenosis    Chronic LBP with bilat neurogenic claudication  . Metatarsal fracture, pathologic 01/2018   Right;  orthocarolina-->post op shoe continued, f/u x-ray planned.  . Osteoarthritis of left knee 08/2017   severe, diffuse, tricompartmental.  Responded to steroid injection.    . Osteoporosis 06/07/2016   DEXA T-score of -3.1.  Fosamax planned 2017 but dental work prevented start..  Pathologic toe fx 12/2017--repeat DEXA 08/2018, T-score -3.3 radius.  . Renal transplant recipient 11-28-2015   Baseline Cr as of 02/2018= 1.0-1.2.  . SVT (supraventricular tachycardia) ([place])   . Vitamin B12 deficiency 12/2018   Starting replacement as of 12/11/2018    Past Surgical History:  Procedure Laterality Date  . AV FISTULA PLACEMENT Right    Right arm: aneurismal dilatation 2017 being followed by CV surgeons  . CARDIOVASCULAR STRESS TEST     03/10/15 ETT ([facility]): Exercise ECG negative at 83% max predicted HR.   [name] COLONOSCOPY  2003; 05/2012   diverticulosis, no polyps.  Recall 10 yrs  . colonoscopy with polypectomy     Dr Henrene Pastor  . DEXA  06/07/2016; 08/22/2018   2017 T-score -3.1.  2020 T score -3.3  . KIDNEY TRANSPLANT  2015/11/28   Deceased donor kidney transplant, with Thymoglobulin induction  . LIGATION OF ARTERIOVENOUS  FISTULA Right 02/14/2017   Procedure: LIGATION OF ARTERIOVENOUS  FISTULA;  Surgeon: Angelia Mould, MD;  Location: [place];  Service: Vascular;  Laterality: Right;  . NEPHRECTOMY  1993   for malignancy- left  . RENAL BIOPSY     right  . RESECTION OF ARTERIOVENOUS FISTULA ANEURYSM Right 02/14/2017   Procedure: EXCISION OF RIGHT BRACHIOCEPHALIC ARTERIOVENOUS FISTULA ANEURYSM;  Surgeon: Angelia Mould, MD;  Location: [place];  Service: Vascular;  Laterality: Right;  . TEE WITHOUT CARDIOVERSION N/A 08/27/2013   Procedure: TRANSESOPHAGEAL ECHOCARDIOGRAM (TEE);  Surgeon: [name], MD;  [facility];  Service: Cardiovascular;  Laterality: N/A;  . TOTAL ABDOMINAL HYSTERECTOMY W/ BILATERAL SALPINGOOPHORECTOMY  1997   fibroids  . TRANSTHORACIC ECHOCARDIOGRAM      03/10/15 echo ([facility]): LV cavity normal in size, focal basal hypertrophy, normal systolic function, EF 25% (visual est). Normal wall motion, no regional wall motion abnormalities. Mild diastolic dysfunction with normal LA chamber size. No significant valve stenosis or regurgitation.  . VULVA / PERINEUM BIOPSY  2015    There were no vitals filed for this visit.  Subjective Assessment - 02/26/19 0909    Subjective  Pt reports she was able to stand in line at grocery store with minimal pain; she was tickled about this.  She states she has not been fully compliant with HEP.    Currently in Pain?  No/denies    Pain Score  0-No pain         [facility] PT Assessment - 02/26/19 0001      Assessment   Medical Diagnosis  Chronic LBP     Referring Provider (PT)  Dr Dianah Field     Onset Date/Surgical Date  08/08/18   pain present for ~ 5 yrs    Hand Dominance  Left    Next MD Visit  PRN     Prior Therapy  here for Lt shoulder x 4 visits 2017       [facility] Adult PT Treatment/Exercise - 02/26/19 0001      Lumbar Exercises: Stretches   Passive Hamstring Stretch  Right;Left;2 reps;30 seconds   supine    Passive Hamstring Stretch Limitations  trial in sitting, increased back pain - switched to supine    Lower Trunk Rotation  60 seconds   gentle rocking, small range, arms in T   Hip Flexor Stretch  Right;Left;2 reps;20 seconds    Prone on Elbows Stretch  --   5 sec hold, 8 reps    Press Ups  --   2-3 sec   Piriformis Stretch  Right;Left;1 rep;30 seconds   travell, strap assist   Piriformis Stretch Limitations  trial in sitting, increased LBP, switched to supine       Lumbar Exercises: Aerobic   Nustep  L4: arms/legs x 6 min    PTA present to discuss progress     Lumbar Exercises: Seated   Sit to Stand  5 reps   2 sets, 1 from NuStep, 1 from low black mat   Sit to Stand Limitations  core engaged, eccentric lowering - multiple reps of demo/ VC for improved form.      Other Seated Lumbar Exercises  core engagment x 5 reps of 5 sec, cues to breathe    Other Seated Lumbar Exercises  scap retraction x 5 sec x 5 reps - cues for upright posture.       Lumbar Exercises: Supine   Other Supine Lumbar Exercises  reviewed supine to/from sit; pt returned demo with cues.       Modalities   Modalities  --   declined; pain  free            PT Education - 02/26/19 1221    Education Details  HEP    Person(s) Educated  Patient    Methods  Explanation;Handout;Demonstration;Verbal cues    Comprehension  Verbalized understanding;Returned demonstration          PT Long Term Goals - 02/12/19 1057      PT LONG TERM GOAL #1   Title  Improve posture and alignment with patient to demonstrate improved upright posture with demo of good body mechanics for positioning and positional changes 03/26/2019    Time  6    Period  Weeks    Status  New      PT LONG TERM GOAL #2   Title  Increase LE strength and core stability with patient to demo 5-/5 to 5/5 strength LE's 03/26/2019    Time  6    Period  Weeks    Status  New      PT LONG TERM GOAL #3   Title  Decrease pain by 50-75% with functional activity allowing patient to stand and walk for 10-15 min with minimal difficulty 03/26/2019    Time  6    Period  Weeks    Status  New      PT LONG TERM GOAL #4   Title  03/26/2019    Time  6    Period  Weeks    Status  New      PT LONG TERM GOAL #5   Title  Improve FOTO to </= 46% limitation 03/26/2019    Time  6    Period  Weeks    Status  New            Plan - 02/26/19 0946    Clinical Impression Statement  Pt reporting overall decrease in back pain.  Pt required mod cues for form/ technique of exercises.  She tolerated all exercises without increase in pain. Encouraged pt to comply with daily HEP to assist in meeting goals. Progressing towards goals.    Stability/Clinical Decision Making  Stable/Uncomplicated    Rehab Potential  Good    PT Frequency  2x  / week    PT Duration  6 weeks    PT Treatment/Interventions  Patient/family education;ADLs/Self Care Home Management;Cryotherapy;Electrical Stimulation;Iontophoresis 4mg /ml Dexamethasone;Moist Heat;Ultrasound;Dry needling;Manual techniques;Neuromuscular re-education;Therapeutic activities;Therapeutic exercise;Functional mobility training    PT Next Visit Plan  review HEP; progress with core stabilization and strengthening; LE strengthening; manual work and modalities as indicated    PT [place] and Agree with Plan of Care  Patient       Patient will benefit from skilled therapeutic intervention in order to improve the following deficits and impairments:  Pain, Increased fascial restricitons, Increased muscle spasms, Hypomobility, Decreased strength, Decreased mobility, Decreased range of motion, Decreased activity tolerance  Visit Diagnosis: 1. Chronic bilateral low back pain, unspecified whether sciatica present   2. Muscle weakness (generalized)   3. Other symptoms and signs involving the musculoskeletal system        Problem List Patient Active Problem List   Diagnosis Date Noted  . Lumbar spinal stenosis 02/05/2019  . Left shoulder pain 05/02/2016  . Wheeze 09/16/2015  . Cough 09/16/2015  . Acute bronchitis 09/16/2015  . History of renal carcinoma 05/14/2015  . Vulvar ulceration 06/22/2014  . End stage renal disease ([place]) 04/09/2014  . Mechanical complication of other vascular device, implant, and graft 04/09/2014  .  Cervical spondylosis 10/10/2013  . Aortic insufficiency 08/20/2013  . Hyperglycemia 12/14/2012  . SVT (supraventricular tachycardia) ([place]) 12/13/2012  . Uremia 12/13/2012  . Chronic kidney disease (CKD), stage IV (severe) ([place]) 12/07/2012  . Bradycardia 12/07/2012  . VITAMIN D DEFICIENCY 09/08/2009  . CHRON GLOMERULONEPHRIT W/LES MEMBRANOUS GLN 09/08/2009  . FATIGUE 09/08/2009  . DIVERTICULOSIS, COLON 09/26/2008  .  OSTEOPENIA 09/26/2008  . COLONIC POLYPS, HX OF 09/26/2008  . Hypothyroidism 10/02/2007  . OTHER AND UNSPECIFIED HYPERLIPIDEMIA 10/02/2007  . BENIGN POSITIONAL VERTIGO 10/02/2007  . Unspecified essential hypertension 10/02/2007  . Gout, unspecified 03/26/2007  . DISORDER, DYSMETABOLIC SYNDROME X 85/99/2341  . HX, PERSONAL, MALIGNANCY, KIDNEY 03/26/2007  . OSTEOARTHRITIS 12/26/2006   Kerin Perna, PTA 02/26/19 12:22 PM  [facility] [place], [place], [address] Phone: [phone]   Fax:  [phone]  Name: Tabitha Green MRN: [id number] Date of Birth: [birth year]

## 2019-02-26 NOTE — Patient Instructions (Signed)
Access Code: [email or web address]  URL: [email or web address]  Date: 02/26/2019  Prepared by: Kerin Perna   Exercises  Modified HEP to include  Static Prone on Elbows - 5 reps - 1 sets - 2x daily - 7x weekly  Seated Transversus Abdominis Bracing - 5 reps - 1 sets - 2x daily - 7x weekly  Seated Scapular Retraction - 5 reps - 1 sets - 2x daily - 7x weekly

## 2019-02-27 ENCOUNTER — Ambulatory Visit (INDEPENDENT_AMBULATORY_CARE_PROVIDER_SITE_OTHER): Payer: Medicare Other

## 2019-02-27 DIAGNOSIS — D631 Anemia in chronic kidney disease: Secondary | ICD-10-CM

## 2019-02-27 DIAGNOSIS — N189 Chronic kidney disease, unspecified: Secondary | ICD-10-CM | POA: Diagnosis not present

## 2019-02-27 MED ORDER — CYANOCOBALAMIN 1000 MCG/ML IJ SOLN
1000.0000 ug | Freq: Once | INTRAMUSCULAR | Status: AC
  Administered 2019-02-27: 11:00:00 1000 ug via INTRAMUSCULAR

## 2019-02-27 NOTE — Progress Notes (Signed)
Tabitha Green is a 73 y.o. female presents to the office today for Cyanocobalamin 1000 mcg  injections, per physician's orders. Original order: B12 injection weekly. I'll check on another B12 level.   1ML (med), 1 mL(dose),  injection (route) was administered L [place] (location) today. Patient tolerated injection. Patient due for follow up labs/provider appt: Yes. Date due: 05/21/2019, appt made Yes Patient next injection due: Aug 22,2020, appt made Yes  [place]

## 2019-02-28 ENCOUNTER — Ambulatory Visit (INDEPENDENT_AMBULATORY_CARE_PROVIDER_SITE_OTHER): Payer: Medicare Other | Admitting: Physical Therapy

## 2019-02-28 ENCOUNTER — Other Ambulatory Visit: Payer: Self-pay

## 2019-02-28 DIAGNOSIS — G8929 Other chronic pain: Secondary | ICD-10-CM | POA: Diagnosis not present

## 2019-02-28 DIAGNOSIS — R29898 Other symptoms and signs involving the musculoskeletal system: Secondary | ICD-10-CM

## 2019-02-28 DIAGNOSIS — M545 Low back pain, unspecified: Secondary | ICD-10-CM

## 2019-02-28 DIAGNOSIS — M6281 Muscle weakness (generalized): Secondary | ICD-10-CM

## 2019-02-28 NOTE — Therapy (Signed)
[place], [place], [address] Phone: [phone]   Fax:  [phone]  Physical Therapy Treatment  Patient Details  Name: Tabitha Green MRN: [id number] Date of Birth: [birth year] Referring Provider (PT): Dr Dianah Field    Encounter Date: 02/28/2019  PT End of Session - 02/28/19 0906    Visit Number  4    Number of Visits  12    Date for PT Re-Evaluation  03/26/19    PT Start Time  0902    PT Stop Time  0947    PT Time Calculation (min)  45 min    Activity Tolerance  Patient tolerated treatment well    Behavior During Therapy  [facility] for tasks assessed/performed       Past Medical History:  Diagnosis Date  . Anemia of chronic kidney failure    r/t kidney function- receives Procrit  . Anxiety   . Cancer ([place])   . Chronic renal insufficiency, stage III (moderate) (HCC) 10/2015   Post-transplant Cr 1.3, GFR 41 as of June 2017 f/u w/ [facility]  . CMV infection [facility]) summer 2017   Valcyte per ID/Renal transplant team  . Diverticulosis    a. 05/2012 colonoscopy  . Erosive lichen planus of vulva    Topical steroids (managed by Dr. Mila Palmer [facility].  . ESRD (end stage renal disease) ([place])    began dialysis 2014--followed by Dr. [name].  Received deceased donor kidney transplant 10/2015.  [name] FSGS (focal segmental glomerulosclerosis)    right kidney; hx of left renal cell cancer and got nephrectomy 1993.  [name] GERD (gastroesophageal reflux disease)   . Gout   . Heart murmur    (Diastolic) ECHO 10/1495 showed that this murmur is coming from pt's R arm AV fistula  . Hiatal hernia   . History of renal cell cancer 1993   Left nephrectomy  . Hyperlipidemia   . Hypertension   . Hypothyroidism   . Impingement syndrome of left shoulder 04/2016   Dr. Christy Sartorius to PT  . Lumbar spinal stenosis    Chronic LBP with bilat neurogenic  claudication  . Metatarsal fracture, pathologic 01/2018   Right; orthocarolina-->post op shoe continued, f/u x-ray planned.  . Osteoarthritis of left knee 08/2017   severe, diffuse, tricompartmental.  Responded to steroid injection.    . Osteoporosis 06/07/2016   DEXA T-score of -3.1.  Fosamax planned 2017 but dental work prevented start..  Pathologic toe fx 12/2017--repeat DEXA 08/2018, T-score -3.3 radius.  . Renal transplant recipient 11/28/15   Baseline Cr as of 02/2018= 1.0-1.2.  . SVT (supraventricular tachycardia) ([place])   . Vitamin B12 deficiency 12/2018   Starting replacement as of 12/11/2018    Past Surgical History:  Procedure Laterality Date  . AV FISTULA PLACEMENT Right    Right arm: aneurismal dilatation 2017 being followed by CV surgeons  . CARDIOVASCULAR STRESS TEST     03/10/15 ETT ([facility]): Exercise ECG negative at 83% max predicted HR.   [name] COLONOSCOPY  2003; 05/2012   diverticulosis, no polyps.  Recall 10 yrs  . colonoscopy with polypectomy     Dr Henrene Pastor  . DEXA  06/07/2016; 08/22/2018   2017 T-score -3.1.  2020 T score -3.3  . KIDNEY TRANSPLANT  November 28, 2015   Deceased donor kidney transplant, with Thymoglobulin induction  . LIGATION OF ARTERIOVENOUS  FISTULA Right 02/14/2017   Procedure: LIGATION OF ARTERIOVENOUS  FISTULA;  Surgeon: Scot Dock,  Judeth Cornfield, MD;  Location: [place];  Service: Vascular;  Laterality: Right;  . NEPHRECTOMY  1993   for malignancy- left  . RENAL BIOPSY     right  . RESECTION OF ARTERIOVENOUS FISTULA ANEURYSM Right 02/14/2017   Procedure: EXCISION OF RIGHT BRACHIOCEPHALIC ARTERIOVENOUS FISTULA ANEURYSM;  Surgeon: Angelia Mould, MD;  Location: [place];  Service: Vascular;  Laterality: Right;  . TEE WITHOUT CARDIOVERSION N/A 08/27/2013   Procedure: TRANSESOPHAGEAL ECHOCARDIOGRAM (TEE);  Surgeon: [name], MD;  Location: [facility] ENDOSCOPY;  Service: Cardiovascular;  Laterality: N/A;  . TOTAL ABDOMINAL HYSTERECTOMY W/ BILATERAL  SALPINGOOPHORECTOMY  1997   fibroids  . TRANSTHORACIC ECHOCARDIOGRAM     03/10/15 echo ([facility]): LV cavity normal in size, focal basal hypertrophy, normal systolic function, EF 32% (visual est). Normal wall motion, no regional wall motion abnormalities. Mild diastolic dysfunction with normal LA chamber size. No significant valve stenosis or regurgitation.  . VULVA / PERINEUM BIOPSY  2015    There were no vitals filed for this visit.  Subjective Assessment - 02/28/19 0907    Subjective  Pt reports she has been working on her core, tightening when she rises. She thinks it is getting a little easier. She states she was sore yesterday.    Pertinent History  Kidney cancer 1993; dialysis 2014-2017; kidney transplant 2017; stent placement and removal; hysterectomy    Patient Stated Goals  help get rid of some of the back pain    Currently in Pain?  Yes    Pain Score  5     Pain Location  Back    Pain Orientation  Right;Lower    Pain Descriptors / Indicators  Aching    Aggravating Factors   prlonged standing, walking    Pain Relieving Factors  tyenol, heat         [facility] PT Assessment - 02/28/19 0001      Assessment   Medical Diagnosis  Chronic LBP     Referring Provider (PT)  Dr Dianah Field     Onset Date/Surgical Date  08/08/18   pain present for ~ 5 yrs    Hand Dominance  Left    Next MD Visit  PRN     Prior Therapy  here for Lt shoulder x 4 visits 2017       [facility] Adult PT Treatment/Exercise - 02/28/19 0001      Lumbar Exercises: Stretches   Passive Hamstring Stretch  Right;Left;2 reps;30 seconds   supine    Lower Trunk Rotation  60 seconds   gentle rocking, small range, arms in T   Prone on Elbows Stretch  5 reps;10 seconds    Quad Stretch  Right;Left;20 seconds;2 reps   seated, foot under chair.    Piriformis Stretch  Right;Left;30 seconds;2 reps   travell, strap assist     Lumbar Exercises: Aerobic   Nustep  L4: arms/legs x 6.5 min     Other  Aerobic Exercise  2 laps around gym to decrease stiffness in hips/knees       Lumbar Exercises: Standing   Heel Raises  10 reps   UE support on counter   Row  Both;10 reps    Theraband Level (Row)  Level 2 (Red)    Other Standing Lumbar Exercises  standing hip ext x 10, hip abdct x 10 (multiple cues for form) ; mini squat holding sink x 5 reps      Lumbar Exercises: Seated   Sit to Stand  --  2 reps     Lumbar Exercises: Supine   Ab Set  10 reps;5 seconds    Bent Knee Raise  10 reps   with core engaged.      Modalities   Modalities  --   declined; will do at home.                  PT Long Term Goals - 02/12/19 1057      PT LONG TERM GOAL #1   Title  Improve posture and alignment with patient to demonstrate improved upright posture with demo of good body mechanics for positioning and positional changes 03/26/2019    Time  6    Period  Weeks    Status  New      PT LONG TERM GOAL #2   Title  Increase LE strength and core stability with patient to demo 5-/5 to 5/5 strength LE's 03/26/2019    Time  6    Period  Weeks    Status  New      PT LONG TERM GOAL #3   Title  Decrease pain by 50-75% with functional activity allowing patient to stand and walk for 10-15 min with minimal difficulty 03/26/2019    Time  6    Period  Weeks    Status  New      PT LONG TERM GOAL #4   Title  03/26/2019    Time  6    Period  Weeks    Status  New      PT LONG TERM GOAL #5   Title  Improve FOTO to </= 46% limitation 03/26/2019    Time  6    Period  Weeks    Status  New            Plan - 02/28/19 0954    Clinical Impression Statement  Pt tolerated all exercises well, requiring mod cues (tactile/verbal) for form of exercise.  She reported decrease in pain at end of session to 3/10; declined modalities as she will use heat at home if needed.  Continued encouragement given to pt for compliance to HEP to assist with meeting her goal of standing in grocery store with minimal  pain.    Stability/Clinical Decision Making  Stable/Uncomplicated    Rehab Potential  Good    PT Frequency  2x / week    PT Duration  6 weeks    PT Treatment/Interventions  Patient/family education;ADLs/Self Care Home Management;Cryotherapy;Electrical Stimulation;Iontophoresis 4mg /ml Dexamethasone;Moist Heat;Ultrasound;Dry needling;Manual techniques;Neuromuscular re-education;Therapeutic activities;Therapeutic exercise;Functional mobility training    PT Next Visit Plan  review HEP; progress with core stabilization and strengthening; LE strengthening; manual work and modalities as indicated    PT [place] and Agree with Plan of Care  Patient       Patient will benefit from skilled therapeutic intervention in order to improve the following deficits and impairments:  Pain, Increased fascial restricitons, Increased muscle spasms, Hypomobility, Decreased strength, Decreased mobility, Decreased range of motion, Decreased activity tolerance  Visit Diagnosis: 1. Chronic bilateral low back pain, unspecified whether sciatica present   2. Muscle weakness (generalized)   3. Other symptoms and signs involving the musculoskeletal system        Problem List Patient Active Problem List   Diagnosis Date Noted  . Lumbar spinal stenosis 02/05/2019  . Left shoulder pain 05/02/2016  . Wheeze 09/16/2015  . Cough 09/16/2015  . Acute bronchitis 09/16/2015  . History  of renal carcinoma 05/14/2015  . Vulvar ulceration 06/22/2014  . End stage renal disease ([place]) 04/09/2014  . Mechanical complication of other vascular device, implant, and graft 04/09/2014  . Cervical spondylosis 10/10/2013  . Aortic insufficiency 08/20/2013  . Hyperglycemia 12/14/2012  . SVT (supraventricular tachycardia) ([place]) 12/13/2012  . Uremia 12/13/2012  . Chronic kidney disease (CKD), stage IV (severe) ([place]) 12/07/2012  . Bradycardia 12/07/2012  . VITAMIN D DEFICIENCY 09/08/2009  . CHRON  GLOMERULONEPHRIT W/LES MEMBRANOUS GLN 09/08/2009  . FATIGUE 09/08/2009  . DIVERTICULOSIS, COLON 09/26/2008  . OSTEOPENIA 09/26/2008  . COLONIC POLYPS, HX OF 09/26/2008  . Hypothyroidism 10/02/2007  . OTHER AND UNSPECIFIED HYPERLIPIDEMIA 10/02/2007  . BENIGN POSITIONAL VERTIGO 10/02/2007  . Unspecified essential hypertension 10/02/2007  . Gout, unspecified 03/26/2007  . DISORDER, DYSMETABOLIC SYNDROME X 83/66/2947  . HX, PERSONAL, MALIGNANCY, KIDNEY 03/26/2007  . OSTEOARTHRITIS 12/26/2006   Kerin Perna, PTA 02/28/19 10:00 AM  [facility] [place], [place], [address] Phone: [phone]   Fax:  [phone]  Name: TENYA ARAQUE MRN: [id number] Date of Birth: [birth year]

## 2019-03-01 ENCOUNTER — Encounter: Payer: Self-pay | Admitting: Family Medicine

## 2019-03-02 NOTE — Progress Notes (Signed)
Pt with vit B12 deficiency.  Agree with vit B12 1000 mcg IM in office today. Signed:  Crissie Sickles, MD           03/02/2019

## 2019-03-05 ENCOUNTER — Ambulatory Visit (INDEPENDENT_AMBULATORY_CARE_PROVIDER_SITE_OTHER): Payer: Medicare Other | Admitting: Rehabilitative and Restorative Service Providers"

## 2019-03-05 ENCOUNTER — Encounter: Payer: Self-pay | Admitting: Rehabilitative and Restorative Service Providers"

## 2019-03-05 ENCOUNTER — Other Ambulatory Visit: Payer: Self-pay

## 2019-03-05 DIAGNOSIS — R29898 Other symptoms and signs involving the musculoskeletal system: Secondary | ICD-10-CM | POA: Diagnosis not present

## 2019-03-05 DIAGNOSIS — G8929 Other chronic pain: Secondary | ICD-10-CM | POA: Diagnosis not present

## 2019-03-05 DIAGNOSIS — M545 Low back pain, unspecified: Secondary | ICD-10-CM

## 2019-03-05 DIAGNOSIS — M6281 Muscle weakness (generalized): Secondary | ICD-10-CM | POA: Diagnosis not present

## 2019-03-05 NOTE — Therapy (Signed)
[place], [place], [address] Phone: [phone]   Fax:  [phone]  Physical Therapy Treatment  Patient Details  Name: Tabitha Green MRN: [id number] Date of Birth: 1946-05-16 Referring Provider (PT): Dr Dianah Field    Encounter Date: 03/05/2019  PT End of Session - 03/05/19 0901    Visit Number  5    Number of Visits  12    Date for PT Re-Evaluation  03/26/19    PT Start Time  0900    PT Stop Time  0945    PT Time Calculation (min)  45 min    Activity Tolerance  Patient tolerated treatment well       Past Medical History:  Diagnosis Date  . Anemia of chronic kidney failure    r/t kidney function- receives Procrit  . Anxiety   . Cancer ([place])   . Chronic renal insufficiency, stage III (moderate) (HCC) 10/2015   Post-transplant Cr 1.3, GFR 41 as of June 2017 f/u w/ [facility]  . CMV infection [facility]) summer 2017   Valcyte per ID/Renal transplant team  . Diverticulosis    a. 05/2012 colonoscopy  . Erosive lichen planus of vulva    Topical steroids (managed by Dr. Mila [facility].  . ESRD (end stage renal disease) ([place])    began dialysis 2014--followed by Dr. [name].  Received deceased donor kidney transplant 10/2015.  [name] FSGS (focal segmental glomerulosclerosis)    right kidney; hx of left renal cell cancer and got nephrectomy 1993.  [name] GERD (gastroesophageal reflux disease)   . Gout   . Heart murmur    (Diastolic) ECHO 11/5623 showed that this murmur is coming from pt's R arm AV fistula  . Hiatal hernia   . History of renal cell cancer 1993   Left nephrectomy  . Hyperlipidemia   . Hypertension   . Hypothyroidism   . Impingement syndrome of left shoulder 04/2016   Dr. Christy Sartorius to PT  . Lumbar spinal stenosis    Chronic LBP with bilat neurogenic claudication  . Metatarsal fracture, pathologic 01/2018   Right;  orthocarolina-->post op shoe continued, f/u x-ray planned.  . Osteoarthritis of left knee 08/2017   severe, diffuse, tricompartmental.  Responded to steroid injection.    . Osteoporosis 06/07/2016   DEXA T-score of -3.1.  Fosamax planned 2017 but dental work prevented start..  Pathologic toe fx 12/2017--repeat DEXA 08/2018, T-score -3.3 radius.  . Renal transplant recipient 12-03-2015   Baseline Cr as of 02/2018= 1.0-1.2.  . SVT (supraventricular tachycardia) ([place])   . Vitamin B12 deficiency 12/2018   Starting replacement as of 12/11/2018    Past Surgical History:  Procedure Laterality Date  . AV FISTULA PLACEMENT Right    Right arm: aneurismal dilatation 2017 being followed by CV surgeons  . CARDIOVASCULAR STRESS TEST     03/10/15 ETT ([facility]): Exercise ECG negative at 83% max predicted HR.   [name] COLONOSCOPY  2003; 05/2012   diverticulosis, no polyps.  Recall 10 yrs  . colonoscopy with polypectomy     Dr Henrene Pastor  . DEXA  06/07/2016; 08/22/2018   2017 T-score -3.1.  2020 T score -3.3  . KIDNEY TRANSPLANT  12-03-2015   Deceased donor kidney transplant, with Thymoglobulin induction  . LIGATION OF ARTERIOVENOUS  FISTULA Right 02/14/2017   Procedure: LIGATION OF ARTERIOVENOUS  FISTULA;  Surgeon: Angelia Mould, MD;  Location: [facility];  Service: Vascular;  Laterality: Right;  . NEPHRECTOMY  1993   for malignancy- left  . RENAL BIOPSY     right  . RESECTION OF ARTERIOVENOUS FISTULA ANEURYSM Right 02/14/2017   Procedure: EXCISION OF RIGHT BRACHIOCEPHALIC ARTERIOVENOUS FISTULA ANEURYSM;  Surgeon: Angelia Mould, MD;  Location: [facility];  Service: Vascular;  Laterality: Right;  . TEE WITHOUT CARDIOVERSION N/A 08/27/2013   Procedure: TRANSESOPHAGEAL ECHOCARDIOGRAM (TEE);  Surgeon: [name], MD;  Location: [facility] END[facility]COPY;  Service: Cardiovascular;  Laterality: N/A;  . TOTAL ABDOMINAL HYSTERECTOMY W/ BILATERAL SALPINGOOPHORECTOMY  1997   fibroids  . TRANSTHORACIC ECHOCARDIOGRAM      03/10/15 echo ([facility]): LV cavity normal in size, focal basal hypertrophy, normal systolic function, EF 58% (visual est). Normal wall motion, no regional wall motion abnormalities. Mild diastolic dysfunction with normal LA chamber size. No significant valve stenosis or regurgitation.  . VULVA / PERINEUM BIOPSY  2015    There were no vitals filed for this visit.  Subjective Assessment - 03/05/19 0902    Subjective  May be a little better. Can now stand in the grocery line for a little longer before having pain. Working on some of the exercises at home.    Currently in Pain?  Yes    Pain Score  4     Pain Location  Back    Pain Orientation  Right;Lower    Pain Descriptors / Indicators  Aching    Pain Type  Chronic pain         [facility] PT Assessment - 03/05/19 0001      Assessment   Medical Diagnosis  Chronic LBP     Referring Provider (PT)  Dr Dianah Field     Onset Date/Surgical Date  08/08/18   pain present for ~ 5 yrs    Hand Dominance  Left    Next MD Visit  PRN       AROM   Overall AROM Comments  imrpoving trunk mobility with decreasing pain       Flexibility   Hamstrings  WFL's bilat     Piriformis  tight Lt > Rt                    [facility] Adult PT Treatment/Exercise - 03/05/19 0001      Lumbar Exercises: Stretches   Passive Hamstring Stretch  Right;Left;2 reps;30 seconds   supine    Piriformis Stretch  Right;Left;30 seconds;2 reps   travell, strap assist     Lumbar Exercises: Aerobic   Nustep  L5 x 7 min LE/UE(9)     Other Aerobic Exercise  laps around gym to decrease stiffness in hips/knees       Lumbar Exercises: Standing   Scapular Retraction  Strengthening;Both;10 reps;Theraband    Theraband Level (Scapular Retraction)  Level 3 (Green)    Row  Strengthening;Both;10 reps;Theraband    Theraband Level (Row)  Level 3 (Green)    Shoulder Extension  Strengthening;Both;10 reps;Theraband    Theraband Level (Shoulder Extension)   Level 3 (Green)    Other Standing Lumbar Exercises  standing hip ext x 10, hip abd leadiing with heel  x 10     Other Standing Lumbar Exercises  step up 4 inch bilat UE support x 10 each LE       Lumbar Exercises: Seated   Sit to Stand  5 reps   slow eccentric control stand to sit    Other Seated Lumbar Exercises  5# each hand for  curls x 10; alternate hand to shoulder x 20 each UE       Lumbar Exercises: Supine   Bridge  5 reps;5 seconds   encouraging core activation             PT Education - 03/05/19 0946    Education Details  HEP    Person(s) Educated  Patient    Methods  Explanation;Demonstration;Tactile cues;Verbal cues;Handout    Comprehension  Verbalized understanding;Returned demonstration;Verbal cues required;Tactile cues required          PT Long Term Goals - 02/12/19 1057      PT LONG TERM GOAL #1   Title  Improve posture and alignment with patient to demonstrate improved upright posture with demo of good body mechanics for positioning and positional changes 03/26/2019    Time  6    Period  Weeks    Status  New      PT LONG TERM GOAL #2   Title  Increase LE strength and core stability with patient to demo 5-/5 to 5/5 strength LE's 03/26/2019    Time  6    Period  Weeks    Status  New      PT LONG TERM GOAL #3   Title  Decrease pain by 50-75% with functional activity allowing patient to stand and walk for 10-15 min with minimal difficulty 03/26/2019    Time  6    Period  Weeks    Status  New      PT LONG TERM GOAL #4   Title  03/26/2019    Time  6    Period  Weeks    Status  New      PT LONG TERM GOAL #5   Title  Improve FOTO to </= 46% limitation 03/26/2019    Time  6    Period  Weeks    Status  New            Plan - 03/05/19 0902    Clinical Impression Statement  Continue pain. Encouraged patient to work on modifying positions for rest at home and consistent HEP. Workiong on core stabilization in functional positions. May decide to hold  PT and continue with independent HEP    Stability/Clinical Decision Making  Stable/Uncomplicated    Rehab Potential  Good    PT Frequency  2x / week    PT Duration  6 weeks    PT Treatment/Interventions  Patient/family education;ADLs/Self Care Home Management;Cryotherapy;Electrical Stimulation;Iontophoresis 4mg /ml Dexamethasone;Moist Heat;Ultrasound;Dry needling;Manual techniques;Neuromuscular re-education;Therapeutic activities;Therapeutic exercise;Functional mobility training    PT Next Visit Plan  review HEP; progress with core stabilization and strengthening; LE strengthening; manual work and modalities as indicated - patient may opt for continuing HEP independently - will decide for next visit    PT [place] and Agree with Plan of Care  Patient       Patient will benefit from skilled therapeutic intervention in order to improve the following deficits and impairments:  Pain, Increased fascial restricitons, Increased muscle spasms, Hypomobility, Decreased strength, Decreased mobility, Decreased range of motion, Decreased activity tolerance  Visit Diagnosis: 1. Chronic bilateral low back pain, unspecified whether sciatica present   2. Muscle weakness (generalized)   3. Other symptoms and signs involving the musculoskeletal system        Problem List Patient Active Problem List   Diagnosis Date Noted  . Lumbar spinal stenosis 02/05/2019  . Left shoulder pain 05/02/2016  . Wheeze  09/16/2015  . Cough 09/16/2015  . Acute bronchitis 09/16/2015  . History of renal carcinoma 05/14/2015  . Vulvar ulceration 06/22/2014  . End stage renal disease ([facility]) 04/09/2014  . Mechanical complication of other vascular device, implant, and graft 04/09/2014  . Cervical spondylosis 10/10/2013  . Aortic insufficiency 08/20/2013  . Hyperglycemia 12/14/2012  . SVT (supraventricular tachycardia) ([place]) 12/13/2012  . Uremia 12/13/2012  . Chronic kidney disease (CKD),  stage IV (severe) ([place]) 12/07/2012  . Bradycardia 12/07/2012  . VITAMIN D DEFICIENCY 09/08/2009  . CHRON GLOMERULONEPHRIT W/LES MEMBRANOUS GLN 09/08/2009  . FATIGUE 09/08/2009  . DIVERTICULOSIS, COLON 09/26/2008  . OSTEOPENIA 09/26/2008  . COLONIC POLYPS, HX OF 09/26/2008  . Hypothyroidism 10/02/2007  . OTHER AND UNSPECIFIED HYPERLIPIDEMIA 10/02/2007  . BENIGN POSITIONAL VERTIGO 10/02/2007  . Unspecified essential hypertension 10/02/2007  . Gout, unspecified 03/26/2007  . DISORDER, DYSMETABOLIC SYNDROME X 58/25/1898  . HX, PERSONAL, MALIGNANCY, KIDNEY 03/26/2007  . OSTEOARTHRITIS 12/26/2006    Celyn Nilda Simmer PT, MPH  03/05/2019, 10:02 AM  [facility] [place], [place], [address] Phone: [phone]   Fax:  [phone]  Name: Tabitha Green MRN: [id number] Date of Birth: 1945/08/11

## 2019-03-05 NOTE — Patient Instructions (Signed)
Pool exercises; Dangle from noodle or tube  Bicycle legs in deep water   Water at chest level:  Kick legs back  kick legs out to side leading with heel March Little knee bends  With arms:  Flap arms  Punch forward and back Washing marching - both directions for hands/arms Swish Arms out and in   Walking forward/backward/side steps   Resisted External Rotation: in Neutral - Bilateral   PALMS UP Sit or stand, tubing in both hands, elbows at sides, bent to 90, forearms forward. Pinch shoulder blades together and rotate forearms out. Keep elbows at sides. Repeat __10__ times per set. Do _2-3___ sets per session. Do _2-3___ sessions per day.   Low Row: Standing   Face anchor, feet shoulder width apart. Palms up, pull arms back, squeezing shoulder blades together. Repeat 10__ times per set. Do 2-3__ sets per session. Do 2-3__ sessions per week. Anchor Height: Waist   strengthening: Resisted Extension   Hold tubing in right hand, arm forward. Pull arm back, elbow straight. Repeat _10___ times per set. Do 2-3____ sets per session. Do 2-3____ sessions per day.   Bridging    Tighten core. Slowly raise buttocks from floor, keeping core tight. Repeat __5-10__ times per set. Do _1___ sets per session. Do __1__ sessions per day.

## 2019-03-07 ENCOUNTER — Encounter: Payer: Self-pay | Admitting: Physical Therapy

## 2019-03-07 ENCOUNTER — Other Ambulatory Visit: Payer: Self-pay

## 2019-03-07 ENCOUNTER — Ambulatory Visit (INDEPENDENT_AMBULATORY_CARE_PROVIDER_SITE_OTHER): Payer: Medicare Other | Admitting: Physical Therapy

## 2019-03-07 DIAGNOSIS — R29898 Other symptoms and signs involving the musculoskeletal system: Secondary | ICD-10-CM | POA: Diagnosis not present

## 2019-03-07 DIAGNOSIS — M545 Low back pain, unspecified: Secondary | ICD-10-CM

## 2019-03-07 DIAGNOSIS — G8929 Other chronic pain: Secondary | ICD-10-CM

## 2019-03-07 DIAGNOSIS — M6281 Muscle weakness (generalized): Secondary | ICD-10-CM | POA: Diagnosis not present

## 2019-03-07 NOTE — Patient Instructions (Signed)
Supine Bridge - 10 reps - 1-2 sets - 2-3 sec hold - 2x daily - 7x weekly Supine Lower Trunk Rotation - 10 reps - 1 sets - 10 sec hold - 1x daily - 7x weekly

## 2019-03-07 NOTE — Therapy (Signed)
[place], [place], [address] Phone: [phone]   Fax:  [phone]  Physical Therapy Treatment  Patient Details  Name: Tabitha Green MRN: [id number] Date of Birth: August 26, 1945 Referring Provider (PT): Dr Dianah Field    Encounter Date: 03/07/2019  PT End of Session - 03/07/19 0901    Visit Number  6    Number of Visits  12    Date for PT Re-Evaluation  03/26/19    PT Start Time  0901    PT Stop Time  0947    PT Time Calculation (min)  46 min    Activity Tolerance  Patient tolerated treatment well    Behavior During Therapy  [facility] for tasks assessed/performed       Past Medical History:  Diagnosis Date  . Anemia of chronic kidney failure    r/t kidney function- receives Procrit  . Anxiety   . Cancer ([place])   . Chronic renal insufficiency, stage III (moderate) (HCC) 10/2015   Post-transplant Cr 1.3, GFR 41 as of June 2017 f/u w/ [facility]  . CMV infection [facility]) summer 2017   Valcyte per ID/Renal transplant team  . Diverticulosis    a. 05/2012 colonoscopy  . Erosive lichen planus of vulva    Topical steroids (managed by Dr. Mila Palmer [facility]  . ESRD (end stage renal disease) ([place])    began dialysis 2014--followed by Dr. [name].  Received deceased donor kidney transplant 10/2015.  [name] FSGS (focal segmental glomerulosclerosis)    right kidney; hx of left renal cell cancer and got nephrectomy 1993.  [name] GERD (gastroesophageal reflux disease)   . Gout   . Heart murmur    (Diastolic) ECHO 01/6062 showed that this murmur is coming from pt's R arm AV fistula  . Hiatal hernia   . History of renal cell cancer 1993   Left nephrectomy  . Hyperlipidemia   . Hypertension   . Hypothyroidism   . Impingement syndrome of left shoulder 04/2016   Dr. Christy Sartorius to PT  . Lumbar spinal stenosis    Chronic LBP with bilat neurogenic  claudication  . Metatarsal fracture, pathologic 01/2018   Right; orthocarolina-->post op shoe continued, f/u x-ray planned.  . Osteoarthritis of left knee 08/2017   severe, diffuse, tricompartmental.  Responded to steroid injection.    . Osteoporosis 06/07/2016   DEXA T-score of -3.1.  Fosamax planned 2017 but dental work prevented start..  Pathologic toe fx 12/2017--repeat DEXA 08/2018, T-score -3.3 radius.  . Renal transplant recipient 27-Nov-2015   Baseline Cr as of 02/2018= 1.0-1.2.  . SVT (supraventricular tachycardia) ([place])   . Vitamin B12 deficiency 12/2018   Starting replacement as of 12/11/2018    Past Surgical History:  Procedure Laterality Date  . AV FISTULA PLACEMENT Right    Right arm: aneurismal dilatation 2017 being followed by CV surgeons  . CARDIOVASCULAR STRESS TEST     03/10/15 ETT ([facility]): Exercise ECG negative at 83% max predicted HR.   [name] COLONOSCOPY  2003; 05/2012   diverticulosis, no polyps.  Recall 10 yrs  . colonoscopy with polypectomy     Dr Henrene Pastor  . DEXA  06/07/2016; 08/22/2018   2017 T-score -3.1.  2020 T score -3.3  . KIDNEY TRANSPLANT  11/27/2015   Deceased donor kidney transplant, with Thymoglobulin induction  . LIGATION OF ARTERIOVENOUS  FISTULA Right 02/14/2017   Procedure: LIGATION OF ARTERIOVENOUS  FISTULA;  Surgeon: Scot Dock,  Judeth Cornfield, MD;  Location: [place];  Service: Vascular;  Laterality: Right;  . NEPHRECTOMY  1993   for malignancy- left  . RENAL BIOPSY     right  . RESECTION OF ARTERIOVENOUS FISTULA ANEURYSM Right 02/14/2017   Procedure: EXCISION OF RIGHT BRACHIOCEPHALIC ARTERIOVENOUS FISTULA ANEURYSM;  Surgeon: Angelia Mould, MD;  Location: [place];  Service: Vascular;  Laterality: Right;  . TEE WITHOUT CARDIOVERSION N/A 08/27/2013   Procedure: TRANSESOPHAGEAL ECHOCARDIOGRAM (TEE);  Surgeon: [name], MD;  Location: [facility] ENDOSCOPY;  Service: Cardiovascular;  Laterality: N/A;  . TOTAL ABDOMINAL HYSTERECTOMY W/ BILATERAL  SALPINGOOPHORECTOMY  1997   fibroids  . TRANSTHORACIC ECHOCARDIOGRAM     03/10/15 echo ([facility]): LV cavity normal in size, focal basal hypertrophy, normal systolic function, EF 31% (visual est). Normal wall motion, no regional wall motion abnormalities. Mild diastolic dysfunction with normal LA chamber size. No significant valve stenosis or regurgitation.  . VULVA / PERINEUM BIOPSY  2015    There were no vitals filed for this visit.  Subjective Assessment - 03/07/19 0903    Subjective  I can see changes in my walking and I'm trying not to plop down when I sit.    How long can you stand comfortably?  5 min    Patient Stated Goals  help get rid of some of the back pain    Currently in Pain?  Yes    Pain Score  4     Pain Location  Back    Pain Orientation  Right;Lower    Pain Descriptors / Indicators  Aching    Pain Type  Chronic pain         [facility] PT Assessment - 03/07/19 0001      Observation/Other Assessments   Focus on Therapeutic Outcomes (FOTO)   35% limited      Strength   Overall Strength Comments  hip ext and flex 4+/5                   [facility] Adult PT Treatment/Exercise - 03/07/19 0001      Exercises   Exercises  Lumbar      Lumbar Exercises: Stretches   Lower Trunk Rotation  5 reps;10 seconds    Other Lumbar Stretch Exercise  seated HF stretch x 10 sec each for review      Lumbar Exercises: Aerobic   UBE (Upper Arm Bike)  L3 x 62mn fwd/bwd engaging core then     Nustep  L5 x 7 min LE/UE(9)       Lumbar Exercises: Standing   Other Standing Lumbar Exercises  standing hip ext x 10, hip abd leadiing with heel  x 10 ; standing hip ABD 2x5 bil    Other Standing Lumbar Exercises  step up 4 inch bilat UE support x 10 each LE       Lumbar Exercises: Seated   Sit to Stand  5 reps   slow eccentric control stand to sit    Other Seated Lumbar Exercises  5# each hand for curls x 10; alternate hand to shoulder x 20 each UE       Lumbar  Exercises: Supine   Bridge  20 reps             PT Education - 03/07/19 0947    Education Details  reviewed current HEP and added bridge and LTR    Person(s) Educated  Patient    Methods  Explanation;Demonstration;Handout  Comprehension  Verbalized understanding;Returned demonstration          PT Long Term Goals - 03/07/19 0909      PT LONG TERM GOAL #1   Title  Improve posture and alignment with patient to demonstrate improved upright posture with demo of good body mechanics for positioning and positional changes 03/26/2019    Time  6    Period  Weeks    Status  Partially Met      PT LONG TERM GOAL #2   Title  Increase LE strength and core stability with patient to demo 5-/5 to 5/5 strength LE's 03/26/2019    Time  6    Period  Weeks    Status  On-going      PT LONG TERM GOAL #3   Title  Decrease pain by 50-75% with functional activity allowing patient to stand and walk for 10-15 min with minimal difficulty 03/26/2019    Time  6    Period  Weeks    Status  On-going            Plan - 03/07/19 0951    Clinical Impression Statement  Patient continues to have pain mainly with standing. She was able to tolerate only 2 min standing at UBE before pain reported in low back. Patient would like to continue for 2 more weeks to continue functional strengthening. Improved FOTO to 35% limitation.    PT Frequency  2x / week    PT Duration  6 weeks    PT Treatment/Interventions  Patient/family education;ADLs/Self Care Home Management;Cryotherapy;Electrical Stimulation;Iontophoresis 63m/ml Dexamethasone;Moist Heat;Ultrasound;Dry needling;Manual techniques;Neuromuscular re-education;Therapeutic activities;Therapeutic exercise;Functional mobility training    PT Next Visit Plan  progress with core stabilization and strengthening; LE strengthening; manual work and modalities as indicated - patient may opt for continuing HEP independently - will decide for next visit    PT [place] Agree with Plan of Care  Patient       Patient will benefit from skilled therapeutic intervention in order to improve the following deficits and impairments:  Pain, Increased fascial restricitons, Increased muscle spasms, Hypomobility, Decreased strength, Decreased mobility, Decreased range of motion, Decreased activity tolerance  Visit Diagnosis: 1. Chronic bilateral low back pain, unspecified whether sciatica present   2. Muscle weakness (generalized)   3. Other symptoms and signs involving the musculoskeletal system        Problem List Patient Active Problem List   Diagnosis Date Noted  . Lumbar spinal stenosis 02/05/2019  . Left shoulder pain 05/02/2016  . Wheeze 09/16/2015  . Cough 09/16/2015  . Acute bronchitis 09/16/2015  . History of renal carcinoma 05/14/2015  . Vulvar ulceration 06/22/2014  . [place] stage renal disease ([place] 04/09/2014  . Mechanical complication of other vascular device, implant, and graft 04/09/2014  . Cervical spondylosis 10/10/2013  . Aortic insufficiency 08/20/2013  . Hyperglycemia 12/14/2012  . SVT (supraventricular tachycardia) ([place] 12/13/2012  . Uremia 12/13/2012  . Chronic kidney disease (CKD), stage IV (severe) ([place] 12/07/2012  . Bradycardia 12/07/2012  . VITAMIN D DEFICIENCY 09/08/2009  . CHRON GLOMERULONEPHRIT W/LES MEMBRANOUS GLN 09/08/2009  . FATIGUE 09/08/2009  . DIVERTICULOSIS, COLON 09/26/2008  . OSTEOPENIA 09/26/2008  . COLONIC POLYPS, HX OF 09/26/2008  . Hypothyroidism 10/02/2007  . OTHER AND UNSPECIFIED HYPERLIPIDEMIA 10/02/2007  . BENIGN POSITIONAL VERTIGO 10/02/2007  . Unspecified essential hypertension 10/02/2007  . Gout, unspecified 03/26/2007  . DISORDER, DYSMETABOLIC SYNDROME X 014/43/1540 . HX, PERSONAL, MALIGNANCY,  KIDNEY 03/26/2007  . OSTEOARTHRITIS 12/26/2006    Tabitha Green PT 03/07/2019, 9:58 AM  [facility] [place], [place], [address] Phone: [phone]   Fax:  [phone]  Name: KYESHA BALLA MRN: [id number] Date of Birth: August 14, 1945

## 2019-03-12 ENCOUNTER — Encounter: Payer: Medicare Other | Admitting: Physical Therapy

## 2019-03-14 ENCOUNTER — Ambulatory Visit (INDEPENDENT_AMBULATORY_CARE_PROVIDER_SITE_OTHER): Payer: Medicare Other | Admitting: Physical Therapy

## 2019-03-14 ENCOUNTER — Other Ambulatory Visit: Payer: Self-pay

## 2019-03-14 DIAGNOSIS — R29898 Other symptoms and signs involving the musculoskeletal system: Secondary | ICD-10-CM | POA: Diagnosis not present

## 2019-03-14 DIAGNOSIS — M545 Low back pain: Secondary | ICD-10-CM | POA: Diagnosis not present

## 2019-03-14 DIAGNOSIS — M6281 Muscle weakness (generalized): Secondary | ICD-10-CM

## 2019-03-14 DIAGNOSIS — G8929 Other chronic pain: Secondary | ICD-10-CM

## 2019-03-14 NOTE — Therapy (Signed)
[place], [place], [address] Phone: [phone]   Fax:  [phone]  Physical Therapy Treatment  Patient Details  Name: Tabitha Green MRN: [id number] Date of Birth: [birth year] Referring Provider (PT): Dr Dianah Field    Encounter Date: 03/14/2019  PT End of Session - 03/14/19 0937    Visit Number  7    Number of Visits  12    Date for PT Re-Evaluation  03/26/19    PT Start Time  0932    PT Stop Time  1021    PT Time Calculation (min)  49 min    Behavior During Therapy  [facility] for tasks assessed/performed       Past Medical History:  Diagnosis Date  . Anemia of chronic kidney failure    r/t kidney function- receives Procrit  . Anxiety   . Cancer ([place])   . Chronic renal insufficiency, stage III (moderate) (HCC) 10/2015   Post-transplant Cr 1.3, GFR 41 as of June 2017 f/u w/ [facility]  . CMV infection [facility]) summer 2017   Valcyte per ID/Renal transplant team  . Diverticulosis    a. 05/2012 colonoscopy  . Erosive lichen planus of vulva    Topical steroids (managed by Dr. Mila [facility] via [facility]  . ESRD (end stage renal disease) ([place])    began dialysis 2014--followed by Dr. [name].  Received deceased donor kidney transplant 10/2015.  [name] FSGS (focal segmental glomerulosclerosis)    right kidney; hx of left renal cell cancer and got nephrectomy 1993.  [name] GERD (gastroesophageal reflux disease)   . Gout   . Heart murmur    (Diastolic) ECHO 11/6284 showed that this murmur is coming from pt's R arm AV fistula  . Hiatal hernia   . History of renal cell cancer 1993   Left nephrectomy  . Hyperlipidemia   . Hypertension   . Hypothyroidism   . Impingement syndrome of left shoulder 04/2016   Dr. Christy Sartorius to PT  . Lumbar spinal stenosis    Chronic LBP with bilat neurogenic claudication  . Metatarsal fracture, pathologic 01/2018    Right; orthocarolina-->post op shoe continued, f/u x-ray planned.  . Osteoarthritis of left knee 08/2017   severe, diffuse, tricompartmental.  Responded to steroid injection.    . Osteoporosis 06/07/2016   DEXA T-score of -3.1.  Fosamax planned 2017 but dental work prevented start..  Pathologic toe fx 12/2017--repeat DEXA 08/2018, T-score -3.3 radius.  . Renal transplant recipient 11-12-2015   Baseline Cr as of 02/2018= 1.0-1.2.  . SVT (supraventricular tachycardia) ([place])   . Vitamin B12 deficiency 12/2018   Starting replacement as of 12/11/2018    Past Surgical History:  Procedure Laterality Date  . AV FISTULA PLACEMENT Right    Right arm: aneurismal dilatation 2017 being followed by CV surgeons  . CARDIOVASCULAR STRESS TEST     03/10/15 ETT ([facility]): Exercise ECG negative at 83% max predicted HR.   [name] COLONOSCOPY  2003; 05/2012   diverticulosis, no polyps.  Recall 10 yrs  . colonoscopy with polypectomy     Dr Henrene Pastor  . DEXA  06/07/2016; 08/22/2018   2017 T-score -3.1.  2020 T score -3.3  . KIDNEY TRANSPLANT  11-12-15   Deceased donor kidney transplant, with Thymoglobulin induction  . LIGATION OF ARTERIOVENOUS  FISTULA Right 02/14/2017   Procedure: LIGATION OF ARTERIOVENOUS  FISTULA;  Surgeon: Angelia Mould, MD;  Location: [place];  Service: Vascular;  Laterality: Right;  . NEPHRECTOMY  1993   for malignancy- left  . RENAL BIOPSY     right  . RESECTION OF ARTERIOVENOUS FISTULA ANEURYSM Right 02/14/2017   Procedure: EXCISION OF RIGHT BRACHIOCEPHALIC ARTERIOVENOUS FISTULA ANEURYSM;  Surgeon: Angelia Mould, MD;  Location: [place];  Service: Vascular;  Laterality: Right;  . TEE WITHOUT CARDIOVERSION N/A 08/27/2013   Procedure: TRANSESOPHAGEAL ECHOCARDIOGRAM (TEE);  Surgeon: [name], MD;  Location[facility];  Service: Cardiovascular;  Laterality: N/A;  . TOTAL ABDOMINAL HYSTERECTOMY W/ BILATERAL SALPINGOOPHORECTOMY  1997   fibroids  . TRANSTHORACIC ECHOCARDIOGRAM      03/10/15 echo ([facility]): LV cavity normal in size, focal basal hypertrophy, normal systolic function, EF 21% (visual est). Normal wall motion, no regional wall motion abnormalities. Mild diastolic dysfunction with normal LA chamber size. No significant valve stenosis or regurgitation.  . VULVA / PERINEUM BIOPSY  2015    There were no vitals filed for this visit.  Subjective Assessment - 03/14/19 0937    Subjective  Pt reports her back has been bothering her a little more since last visit. She does the exercises in her kitchen when she remembers.  She's hopeful that she's improving.    Patient Stated Goals  help get rid of some of the back pain    Currently in Pain?  Yes    Pain Score  5     Pain Location  Back    Pain Orientation  Right;Lower    Pain Descriptors / Indicators  Aching    Pain Radiating Towards  into Rt hip    Aggravating Factors   prolonged standing    Pain Relieving Factors  tylenol, heat         [facility] PT Assessment - 03/14/19 0001      Assessment   Medical Diagnosis  Chronic LBP     Referring Provider (PT)  Dr Dianah Field     Onset Date/Surgical Date  08/08/18   pain present for ~ 5 yrs    Hand Dominance  Left    Next MD Visit  PRN     Prior Therapy  here for Lt shoulder x 4 visits 2017       [facility] Adult PT Treatment/Exercise - 03/14/19 0001      Self-Care   Self-Care  Other Self-Care Comments    Other Self-Care Comments   pt instructed in self massage with ball to lumbar and hip musculature; demo provided, pt verbalized understanding      Lumbar Exercises: Stretches   Passive Hamstring Stretch  Right;Left;2 reps;30 seconds   supine with strap   Hip Flexor Stretch  Right;Left;2 reps;30 seconds   seated   Piriformis Stretch  Right;Left;2 reps;30 seconds   supine     Lumbar Exercises: Aerobic   Nustep  L4: 6 min (LE/AUE)      Lumbar Exercises: Standing   Row  Strengthening;Both;10 reps;Theraband    Theraband Level (Row)   Level 3 (Green)    Shoulder Extension  Strengthening;10 reps;Both;Theraband    Theraband Level (Shoulder Extension)  Level 2 (Red)    Other Standing Lumbar Exercises  Rt/Lt SLS x 10 sec x 2 reps, with intermittent UE support to steady       Lumbar Exercises: Seated   Sit to Stand  5 reps   slow eccentric control stand to sit    Sit to Stand Limitations  from NuStep chair     Other Seated Lumbar Exercises  hip ext x 5 reps, 2 sets; hip abdct x 10 bilat with UE support.       Moist Heat Therapy   Number Minutes Moist Heat  10 Minutes    Moist Heat Location  Lumbar Spine      Electrical Stimulation   Electrical Stimulation Location  Rt low back and hip     Electrical Stimulation Action  IFC    Electrical Stimulation Parameters  to tolerance    Electrical Stimulation Goals  Pain      Manual Therapy   Manual therapy comments  pt in Lt sidelying    Soft tissue mobilization  STM to Rt low back and hip         PT Long Term Goals - 03/14/19 1038      PT LONG TERM GOAL #1   Title  Improve posture and alignment with patient to demonstrate improved upright posture with demo of good body mechanics for positioning and positional changes 03/26/2019    Time  6    Period  Weeks    Status  Partially Met      PT LONG TERM GOAL #2   Title  Increase LE strength and core stability with patient to demo 5-/5 to 5/5 strength LE's 03/26/2019    Time  6    Period  Weeks    Status  On-going      PT LONG TERM GOAL #3   Title  Decrease pain by 50-75% with functional activity allowing patient to stand and walk for 10-15 min with minimal difficulty 03/26/2019    Time  6    Period  Weeks    Status  On-going      PT LONG TERM GOAL #5   Title  Improve FOTO to </= 46% limitation 03/26/2019    Time  6    Period  Weeks    Status  On-going            Plan - 03/14/19 1032    Clinical Impression Statement  Pt avoids knee flexion with sit to stands, compensating with excessive trunk/hip flexion - pt  given cues to correct with minimal carry over.  Pt fatigues quickly with single leg stance exercises; but participates well throughout session.  Pt reported reduction of pain after estim/MHP.  Pt gradually progressing towards therapy goals.    PT Frequency  2x / week    PT Duration  6 weeks    PT Treatment/Interventions  Patient/family education;ADLs/Self Care Home Management;Cryotherapy;Electrical Stimulation;Iontophoresis 32m/ml Dexamethasone;Moist Heat;Ultrasound;Dry needling;Manual techniques;Neuromuscular re-education;Therapeutic activities;Therapeutic exercise;Functional mobility training    PT Next Visit Plan  progress with core stabilization and strengthening; LE strengthening; manual work and modalities as indicated.    PT Home Exercise Plan  [email or web address]    Consulted and Agree with Plan of Care  Patient       Patient will benefit from skilled therapeutic intervention in order to improve the following deficits and impairments:  Pain, Increased fascial restricitons, Increased muscle spasms, Hypomobility, Decreased strength, Decreased mobility, Decreased range of motion, Decreased activity tolerance  Visit Diagnosis: 1. Chronic bilateral low back pain, unspecified whether sciatica present   2. Muscle weakness (generalized)   3. Other symptoms and signs involving the musculoskeletal system        Problem List Patient Active Problem List   Diagnosis Date Noted  . Lumbar spinal stenosis 02/05/2019  . Left shoulder pain 05/02/2016  . Wheeze 09/16/2015  . Cough 09/16/2015  . Acute bronchitis 09/16/2015  .  History of renal carcinoma 05/14/2015  . Vulvar ulceration 06/22/2014  . End stage renal disease ([place]) 04/09/2014  . Mechanical complication of other vascular device, implant, and graft 04/09/2014  . Cervical spondylosis 10/10/2013  . Aortic insufficiency 08/20/2013  . Hyperglycemia 12/14/2012  . SVT (supraventricular tachycardia) ([place]) 12/13/2012  . Uremia 12/13/2012  . Chronic  kidney disease (CKD), stage IV (severe) ([place]) 12/07/2012  . Bradycardia 12/07/2012  . VITAMIN D DEFICIENCY 09/08/2009  . CHRON GLOMERULONEPHRIT W/LES MEMBRANOUS GLN 09/08/2009  . FATIGUE 09/08/2009  . DIVERTICULOSIS, COLON 09/26/2008  . OSTEOPENIA 09/26/2008  . COLONIC POLYPS, HX OF 09/26/2008  . Hypothyroidism 10/02/2007  . OTHER AND UNSPECIFIED HYPERLIPIDEMIA 10/02/2007  . BENIGN POSITIONAL VERTIGO 10/02/2007  . Unspecified essential hypertension 10/02/2007  . Gout, unspecified 03/26/2007  . DISORDER, DYSMETABOLIC SYNDROME X 00/34/9611  . HX, PERSONAL, MALIGNANCY, KIDNEY 03/26/2007  . OSTEOARTHRITIS 12/26/2006   Kerin Perna, PTA 03/14/19 10:40 AM  [facility] [place], [place], [address] Phone: [phone]   Fax:  [phone]  Name: TIWANA CHAVIS MRN: [id number] Date of Birth: 1946-07-28

## 2019-03-17 ENCOUNTER — Other Ambulatory Visit: Payer: Self-pay | Admitting: Family Medicine

## 2019-03-18 ENCOUNTER — Encounter: Payer: Self-pay | Admitting: Rehabilitative and Restorative Service Providers"

## 2019-03-18 ENCOUNTER — Other Ambulatory Visit: Payer: Self-pay

## 2019-03-18 ENCOUNTER — Ambulatory Visit (INDEPENDENT_AMBULATORY_CARE_PROVIDER_SITE_OTHER): Payer: Medicare Other | Admitting: Rehabilitative and Restorative Service Providers"

## 2019-03-18 DIAGNOSIS — M545 Low back pain, unspecified: Secondary | ICD-10-CM

## 2019-03-18 DIAGNOSIS — R29898 Other symptoms and signs involving the musculoskeletal system: Secondary | ICD-10-CM | POA: Diagnosis not present

## 2019-03-18 DIAGNOSIS — M6281 Muscle weakness (generalized): Secondary | ICD-10-CM | POA: Diagnosis not present

## 2019-03-18 DIAGNOSIS — G8929 Other chronic pain: Secondary | ICD-10-CM

## 2019-03-18 NOTE — Therapy (Addendum)
[facility] [place], [place], [address] Phone: [phone]   Fax:  [phone]  Physical Therapy Treatment  Patient Details  Name: Tabitha Green MRN: [id number] Date of Birth: February 26, 1946 Referring Provider (PT): Dr Dianah Field    Encounter Date: 03/18/2019  PT End of Session - 03/18/19 0931    Visit Number  8    Number of Visits  12    Date for PT Re-Evaluation  03/26/19    PT Start Time  0930    PT Stop Time  1015    PT Time Calculation (min)  45 min    Activity Tolerance  Patient tolerated treatment well       Past Medical History:  Diagnosis Date  . Anemia of chronic kidney failure    r/t kidney function- receives Procrit  . Anxiety   . Cancer ([place])   . Chronic renal insufficiency, stage III (moderate) (HCC) 10/2015   Post-transplant Cr 1.3, GFR 41 as of June 2017 f/u w/ [facility]  . CMV infection [facility]) summer 2017   Valcyte per ID/Renal transplant team  . Diverticulosis    a. 05/2012 colonoscopy  . Erosive lichen planus of vulva    Topical steroids (managed by Dr. Mila Palmer [facility] via [facility] out[facility].  . ESRD (end stage renal disease) ([place])    began dialysis 2014--followed by Dr. [name].  Received deceased donor kidney transplant 10/2015.  [name] FSGS (focal segmental glomerulosclerosis)    right kidney; hx of left renal cell cancer and got nephrectomy 1993.  [name] GERD (gastroesophageal reflux disease)   . Gout   . Heart murmur    (Diastolic) ECHO 10/4740 showed that this murmur is coming from pt's R arm AV fistula  . Hiatal hernia   . History of renal cell cancer 1993   Left nephrectomy  . Hyperlipidemia   . Hypertension   . Hypothyroidism   . Impingement syndrome of left shoulder 04/2016   Dr. Christy Sartorius to PT  . Lumbar spinal stenosis    Chronic LBP with bilat neurogenic claudication  . Metatarsal fracture, pathologic 01/2018   Right;  orthocarolina-->post op shoe continued, f/u x-ray planned.  . Osteoarthritis of left knee 08/2017   severe, diffuse, tricompartmental.  Responded to steroid injection.    . Osteoporosis 06/07/2016   DEXA T-score of -3.1.  Fosamax planned 2017 but dental work prevented start..  Pathologic toe fx 12/2017--repeat DEXA 08/2018, T-score -3.3 radius.  . Renal transplant recipient 2015/11/12   Baseline Cr as of 02/2018= 1.0-1.2.  . SVT (supraventricular tachycardia) ([place])   . Vitamin B12 deficiency 12/2018   Starting replacement as of 12/11/2018    Past Surgical History:  Procedure Laterality Date  . AV FISTULA PLACEMENT Right    Right arm: aneurismal dilatation 2017 being followed by CV surgeons  . CARDIOVASCULAR STRESS TEST     03/10/15 ETT ([facility]): Exercise ECG negative at 83% max predicted HR.   [name] COLONOSCOPY  2003; 05/2012   diverticulosis, no polyps.  Recall 10 yrs  . colonoscopy with polypectomy     Dr Henrene Pastor  . DEXA  06/07/2016; 08/22/2018   2017 T-score -3.1.  2020 T score -3.3  . KIDNEY TRANSPLANT  11/12/2015   Deceased donor kidney transplant, with Thymoglobulin induction  . LIGATION OF ARTERIOVENOUS  FISTULA Right 02/14/2017   Procedure: LIGATION OF ARTERIOVENOUS  FISTULA;  Surgeon: Angelia Mould, MD;  Location: [place];  Service: Vascular;  Laterality: Right;  . NEPHRECTOMY  1993   for malignancy- left  . RENAL BIOPSY     right  . RESECTION OF ARTERIOVENOUS FISTULA ANEURYSM Right 02/14/2017   Procedure: EXCISION OF RIGHT BRACHIOCEPHALIC ARTERIOVENOUS FISTULA ANEURYSM;  Surgeon: Angelia Mould, MD;  Location: [place];  Service: Vascular;  Laterality: Right;  . TEE WITHOUT CARDIOVERSION N/A 08/27/2013   Procedure: TRANSESOPHAGEAL ECHOCARDIOGRAM (TEE);  Surgeon: [name], MD;  Location: [facility];  Service: Cardiovascular;  Laterality: N/A;  . TOTAL ABDOMINAL HYSTERECTOMY W/ BILATERAL SALPINGOOPHORECTOMY  1997   fibroids  . TRANSTHORACIC ECHOCARDIOGRAM      03/10/15 echo ([facility]): LV cavity normal in size, focal basal hypertrophy, normal systolic function, EF 72% (visual est). Normal wall motion, no regional wall motion abnormalities. Mild diastolic dysfunction with normal LA chamber size. No significant valve stenosis or regurgitation.  . VULVA / PERINEUM BIOPSY  2015    There were no vitals filed for this visit.  Subjective Assessment - 03/18/19 0932    Subjective  Tabitha Green reports that she is feeling a little bit better. Still sitting propped up on the bed to watch tv. She is doing some of her exercises at home.    Currently in Pain?  Yes    Pain Score  4     Pain Location  Back    Pain Orientation  Right;Lower    Pain Descriptors / Indicators  Aching    Pain Type  Chronic pain    Pain Radiating Towards  into Rt hip    Pain Onset  More than a month ago    Pain Frequency  Constant    Aggravating Factors   prolonged standing    Pain Relieving Factors  tylenol; heat         [facility] PT Assessment - 03/18/19 0001      AROM   Lumbar Flexion  70%     Lumbar Extension  60%     Lumbar - Right Side Bend  75%    Lumbar - Left Side Bend  80%    Lumbar - Right Rotation  25%     Lumbar - Left Rotation  25%       Flexibility   Hamstrings  WFL's bilat     Quadriceps  tight Lt > Rt    ITB  WFL's     Piriformis  tight Lt > Rt                    [facility] Adult PT Treatment/Exercise - 03/18/19 0001      Lumbar Exercises: Stretches   Passive Hamstring Stretch  Right;Left;2 reps;30 seconds   supine with strap   Hip Flexor Stretch  Right;Left;2 reps;30 seconds   seated   Piriformis Stretch  Right;Left;2 reps;30 seconds   supine   Other Lumbar Stretch Exercise  lateral trunk flexion sitting 30 sec x 2 reps       Lumbar Exercises: Aerobic   UBE (Upper Arm Bike)  L2 4 min alt fwd/back       Lumbar Exercises: Seated   Sit to Stand  10 reps   slow eccentric control stand to sit    Other Seated Lumbar Exercises   hip ext x 5 reps, 2 sets; hip abdct x 10 bilat with UE support.       Moist Heat Therapy   Number Minutes Moist Heat  15 Minutes    Moist Heat Location  Lumbar Spine  Acupuncturist Location  Rt low back and hip     Electrical Stimulation Action  IFC    Electrical Stimulation Parameters  to tolerance    Electrical Stimulation Goals  Pain      Manual Therapy   Manual therapy comments  pt in Lt sidelying    Soft tissue mobilization  STM to Rt posterior hip - piriformis and gluts              PT Education - 03/18/19 0955    Education Details  HEP    Person(s) Educated  Patient    Methods  Explanation;Demonstration;Tactile cues;Verbal cues;Handout    Comprehension  Verbalized understanding;Returned demonstration;Verbal cues required;Tactile cues required          PT Long Term Goals - 03/18/19 0955      PT LONG TERM GOAL #1   Title  Improve posture and alignment with patient to demonstrate improved upright posture with demo of good body mechanics for positioning and positional changes 03/26/2019    Time  6    Period  Weeks    Status  Achieved      PT LONG TERM GOAL #2   Title  Increase LE strength and core stability with patient to demo 5-/5 to 5/5 strength LE's 03/26/2019    Time  6    Period  Weeks    Status  Achieved      PT LONG TERM GOAL #3   Title  Decrease pain by 50-75% with functional activity allowing patient to stand and walk for 10-15 min with minimal difficulty 03/26/2019    Time  6    Period  Weeks    Status  Achieved      PT LONG TERM GOAL #4   Title  Independent in HEP 03/26/2019    Time  6    Period  Weeks    Status  Achieved      PT LONG TERM GOAL #5   Title  Improve FOTO to </= 46% limitation 03/26/2019    Time  6    Period  Weeks    Status  Achieved            Plan - 03/18/19 0931    Clinical Impression Statement  Good improvement in trunk ROM and exercise tolerance. Goals of therapy partially  accomplished and pt would like to continue with independent HEP. She will call with any questions or problems.    PT Frequency  2x / week    PT Duration  6 weeks    PT Treatment/Interventions  Patient/family education;ADLs/Self Care Home Management;Cryotherapy;Electrical Stimulation;Iontophoresis 82m/ml Dexamethasone;Moist Heat;Ultrasound;Dry needling;Manual techniques;Neuromuscular re-education;Therapeutic activities;Therapeutic exercise;Functional mobility training    PT Next Visit Plan  pt will continue with independent HEP and call with any questions or problems    PT Home Exercise Plan  NAGAEEDB; [email or web address]   Consulted and Agree with Plan of Care  Patient       Patient will benefit from skilled therapeutic intervention in order to improve the following deficits and impairments:  Pain, Increased fascial restricitons, Increased muscle spasms, Hypomobility, Decreased strength, Decreased mobility, Decreased range of motion, Decreased activity tolerance  Visit Diagnosis: 1. Chronic bilateral low back pain, unspecified whether sciatica present   2. Muscle weakness (generalized)   3. Other symptoms and signs involving the musculoskeletal system        Problem List Patient Active Problem List   Diagnosis Date Noted  . Lumbar spinal stenosis  02/05/2019  . Left shoulder pain 05/02/2016  . Wheeze 09/16/2015  . Cough 09/16/2015  . Acute bronchitis 09/16/2015  . History of renal carcinoma 05/14/2015  . Vulvar ulceration 06/22/2014  . End stage renal disease ([place]) 04/09/2014  . Mechanical complication of other vascular device, implant, and graft 04/09/2014  . Cervical spondylosis 10/10/2013  . Aortic insufficiency 08/20/2013  . Hyperglycemia 12/14/2012  . SVT (supraventricular tachycardia) ([place]) 12/13/2012  . Uremia 12/13/2012  . Chronic kidney disease (CKD), stage IV (severe) ([place]) 12/07/2012  . Bradycardia 12/07/2012  . VITAMIN D DEFICIENCY 09/08/2009  . CHRON GLOMERULONEPHRIT W/LES  MEMBRANOUS GLN 09/08/2009  . FATIGUE 09/08/2009  . DIVERTICULOSIS, COLON 09/26/2008  . OSTEOPENIA 09/26/2008  . COLONIC POLYPS, HX OF 09/26/2008  . Hypothyroidism 10/02/2007  . OTHER AND UNSPECIFIED HYPERLIPIDEMIA 10/02/2007  . BENIGN POSITIONAL VERTIGO 10/02/2007  . Unspecified essential hypertension 10/02/2007  . Gout, unspecified 03/26/2007  . DISORDER, DYSMETABOLIC SYNDROME X 41/14/6431  . HX, PERSONAL, MALIGNANCY, KIDNEY 03/26/2007  . OSTEOARTHRITIS 12/26/2006    Elmina Hendel Nilda Simmer PT, MPH  03/18/2019, 10:15 AM  [facility] [address] [place], [place], [address] Phone: [phone]   Fax:  [phone]  Name: Tabitha Green MRN: [id number] Date of Birth: 1946/02/18  PHYSICAL THERAPY DISCHARGE SUMMARY  Visits from Start of Care: 8  Current functional level related to goals / functional outcomes: See last progress note for discharge status    Remaining deficits: Unknown    Education / Equipment: HEP  Plan: Patient agrees to discharge.  Patient goals were not met. Patient is being discharged due to being pleased with the current functional level.  ?????     Dencil Cayson P. Helene Kelp PT, MPH 04/05/19 10:59 AM

## 2019-03-18 NOTE — Patient Instructions (Signed)
Access Code: [email or web address]  URL: [email or web address]  Date: 03/18/2019  Prepared by: Gillermo Murdoch   Exercises  Seated Sidebending - 3 reps - 1 sets - 30 sec hold - 2x daily - 7x weekly

## 2019-03-20 ENCOUNTER — Encounter: Payer: Self-pay | Admitting: Family Medicine

## 2019-03-20 DIAGNOSIS — E538 Deficiency of other specified B group vitamins: Secondary | ICD-10-CM

## 2019-03-20 DIAGNOSIS — N183 Chronic kidney disease, stage 3 unspecified: Secondary | ICD-10-CM

## 2019-03-20 NOTE — Telephone Encounter (Signed)
Reassure her and tell her I see no reason for her to be worried about her kidney at this time. OK to check lab though. I'll put in orders (B12, mag, BMET).

## 2019-03-20 NOTE — Telephone Encounter (Signed)
Pt wrote the following my chart message: Do I still need to be on 500 mg of magnesium . I am concerned about my kidney. Also would like to have lab work done to test B12 and magnesium levels. Thank you  Tabitha Green August 08, 1946 [phone]  Last labs 07/13 and magnesium was checked on 01/28/2019. Please advise.

## 2019-03-21 ENCOUNTER — Encounter: Payer: Medicare Other | Admitting: Physical Therapy

## 2019-03-21 ENCOUNTER — Other Ambulatory Visit: Payer: Self-pay

## 2019-03-21 ENCOUNTER — Ambulatory Visit (INDEPENDENT_AMBULATORY_CARE_PROVIDER_SITE_OTHER): Payer: Medicare Other

## 2019-03-21 DIAGNOSIS — E538 Deficiency of other specified B group vitamins: Secondary | ICD-10-CM

## 2019-03-21 DIAGNOSIS — N183 Chronic kidney disease, stage 3 unspecified: Secondary | ICD-10-CM

## 2019-03-21 MED ORDER — CYANOCOBALAMIN 1000 MCG/ML IJ SOLN
1000.0000 ug | Freq: Once | INTRAMUSCULAR | Status: AC
  Administered 2019-03-21: 1000 ug via INTRAMUSCULAR

## 2019-03-21 NOTE — Progress Notes (Signed)
Tabitha Green is a 73 y.o. female presents to the office today for Cyanocobalamin 1000 mcg  injections, per physician's orders. Original order: B12 injection weekly. I'll check on another B12 level.             1ML (med), 1 mL(dose),  injection (route) was administered L Delt (location) today. Patient tolerated injection. Patient due for follow up labs/provider appt: Yes. Date due: 05/21/2019, appt made Yes Patient next injection due: 1 month, appt made: No

## 2019-03-21 NOTE — Progress Notes (Signed)
Patient came and in rcvd Vitamin B12 shot but did not get labs. Future labs were placed. Thank you.

## 2019-03-24 ENCOUNTER — Other Ambulatory Visit: Payer: Self-pay

## 2019-03-24 ENCOUNTER — Emergency Department (INDEPENDENT_AMBULATORY_CARE_PROVIDER_SITE_OTHER)
Admission: EM | Admit: 2019-03-24 | Discharge: 2019-03-24 | Disposition: A | Discharge status: Home / Self Care | Payer: Medicare Other | Source: Home / Self Care | Attending: Family Medicine | Admitting: Family Medicine

## 2019-03-24 ENCOUNTER — Encounter: Payer: Self-pay | Admitting: Emergency Medicine

## 2019-03-24 DIAGNOSIS — G608 Other hereditary and idiopathic neuropathies: Secondary | ICD-10-CM

## 2019-03-24 DIAGNOSIS — K140 Glossitis: Secondary | ICD-10-CM

## 2019-03-24 DIAGNOSIS — G629 Polyneuropathy, unspecified: Secondary | ICD-10-CM | POA: Diagnosis not present

## 2019-03-24 NOTE — ED Provider Notes (Signed)
[facility] C[facility]    CSN: [id number] Arrival date & time: 03/24/19  1523     History   Chief Complaint Chief Complaint  Patient presents with  . Thrush  . Foot Problem    HPI Tabitha Green is a 73 y.o. female.   Patient complains of stinging and burning in her tongue and gingiva recently and she wonders if she could have thrush.  She has not seen any white patches in her mouth.  She denies recent antibiotic use. She also complains of mild burning in the plantar surfaces of both feet over the MTP joints.  She denies injury. She receives vitamin B12 injections for B12 deficiency  The history is provided by the patient.    Past Medical History:  Diagnosis Date  . Anemia of chronic kidney failure    r/t kidney function- receives Procrit  . Anxiety   . Cancer ([place])   . Chronic renal insufficiency, stage III (moderate) (HCC) 10/2015   Post-transplant Cr 1.3, GFR 41 as of June 2017 f/u w/ [facility]  . CMV infection [facility]) summer 2017   Valcyte per ID/Renal transplant team  . Diverticulosis    a. 05/2012 colonoscopy  . Erosive lichen planus of vulva    Topical steroids (managed by Dr. Mila [place] Green [facility].  . ESRD (end stage renal disease) ([place])    began dialysis 2014--followed by Dr. [name].  Received deceased donor kidney transplant 10/2015.  [name] FSGS (focal segmental glomerulosclerosis)    right kidney; hx of left renal cell cancer and got nephrectomy 1993.  [name] GERD (gastroesophageal reflux disease)   . Gout   . Heart murmur    (Diastolic) ECHO 04/6788 showed that this murmur is coming from pt's R arm AV fistula  . Hiatal hernia   . History of renal cell cancer 1993   Left nephrectomy  . Hyperlipidemia   . Hypertension   . Hypothyroidism   . Impingement syndrome of left shoulder 04/2016   Dr. Christy Sartorius to PT  . Lumbar spinal stenosis    Chronic LBP with bilat neurogenic claudication  .  Metatarsal fracture, pathologic 01/2018   Right; orthocarolina-->post op shoe continued, f/u x-ray planned.  . Osteoarthritis of left knee 08/2017   severe, diffuse, tricompartmental.  Responded to steroid injection.    . Osteoporosis 06/07/2016   DEXA T-score of -3.1.  Fosamax planned 2017 but dental work prevented start..  Pathologic toe fx 12/2017--repeat DEXA 08/2018, T-score -3.3 radius.  . Renal transplant recipient 11/06/2015   Baseline Cr as of 02/2018= 1.0-1.2.  . SVT (supraventricular tachycardia) ([place])   . Vitamin B12 deficiency 12/2018   Starting replacement as of 12/11/2018    Patient Active Problem List   Diagnosis Date Noted  . Lumbar spinal stenosis 02/05/2019  . Left shoulder pain 05/02/2016  . Wheeze 09/16/2015  . Cough 09/16/2015  . Acute bronchitis 09/16/2015  . History of renal carcinoma 05/14/2015  . Vulvar ulceration 06/22/2014  . End stage renal disease ([place]) 04/09/2014  . Mechanical complication of other vascular device, implant, and graft 04/09/2014  . Cervical spondylosis 10/10/2013  . Aortic insufficiency 08/20/2013  . Hyperglycemia 12/14/2012  . SVT (supraventricular tachycardia) ([place]) 12/13/2012  . Uremia 12/13/2012  . Chronic kidney disease (CKD), stage IV (severe) ([place]) 12/07/2012  . Bradycardia 12/07/2012  . VITAMIN D DEFICIENCY 09/08/2009  . CHRON GLOMERULONEPHRIT W/LES MEMBRANOUS GLN 09/08/2009  . FATIGUE 09/08/2009  . DIVERTICULOSIS, COLON 09/26/2008  . OSTEOPENIA  09/26/2008  . COLONIC POLYPS, HX OF 09/26/2008  . Hypothyroidism 10/02/2007  . OTHER AND UNSPECIFIED HYPERLIPIDEMIA 10/02/2007  . BENIGN POSITIONAL VERTIGO 10/02/2007  . Unspecified essential hypertension 10/02/2007  . Gout, unspecified 03/26/2007  . DISORDER, DYSMETABOLIC SYNDROME X 76/28/3151  . HX, PERSONAL, MALIGNANCY, KIDNEY 03/26/2007  . OSTEOARTHRITIS 12/26/2006    Past Surgical History:  Procedure Laterality Date  . AV FISTULA PLACEMENT Right    Right arm: aneurismal  dilatation 2017 being followed by CV surgeons  . CARDIOVASCULAR STRESS TEST     03/10/15 ETT ([facility]): Exercise ECG negative at 83% max predicted HR.   [name] COLONOSCOPY  2003; 05/2012   diverticulosis, no polyps.  Recall 10 yrs  . colonoscopy with polypectomy     Dr Henrene Pastor  . DEXA  06/07/2016; 08/22/2018   2017 T-score -3.1.  2020 T score -3.3  . KIDNEY TRANSPLANT  11/25/2015   Deceased donor kidney transplant, with Thymoglobulin induction  . LIGATION OF ARTERIOVENOUS  FISTULA Right 02/14/2017   Procedure: LIGATION OF ARTERIOVENOUS  FISTULA;  Surgeon: Angelia Mould, MD;  Location: [place];  Service: Vascular;  Laterality: Right;  . NEPHRECTOMY  1993   for malignancy- left  . RENAL BIOPSY     right  . RESECTION OF ARTERIOVENOUS FISTULA ANEURYSM Right 02/14/2017   Procedure: EXCISION OF RIGHT BRACHIOCEPHALIC ARTERIOVENOUS FISTULA ANEURYSM;  Surgeon: Angelia Mould, MD;  Location: [place];  Service: Vascular;  Laterality: Right;  . TEE WITHOUT CARDIOVERSION N/A 08/27/2013   Procedure: TRANSESOPHAGEAL ECHOCARDIOGRAM (TEE);  Surgeon: [name], MD;  Location: [facility] ENDOSCOPY;  Service: Cardiovascular;  Laterality: N/A;  . TOTAL ABDOMINAL HYSTERECTOMY W/ BILATERAL SALPINGOOPHORECTOMY  1997   fibroids  . TRANSTHORACIC ECHOCARDIOGRAM     03/10/15 echo ([facility]): LV cavity normal in size, focal basal hypertrophy, normal systolic function, EF 76% (visual est). Normal wall motion, no regional wall motion abnormalities. Mild diastolic dysfunction with normal LA chamber size. No significant valve stenosis or regurgitation.  . VULVA / PERINEUM BIOPSY  2015    OB History    Gravida  2   Para  2   Term  2   Preterm  0   AB  0   Living  2     SAB  0   TAB  0   Ectopic  0   Multiple  0   Live Births               Home Medications    Prior to Admission medications   Medication Sig Start Date End Date Taking? Authorizing Provider  acetaminophen  (TYLENOL) 500 MG tablet Take 500-750 mg by mouth every 6 (six) hours as needed for pain.    [provider]  aspirin EC 81 MG EC tablet Take 1 tablet (81 mg total) by mouth daily. 12/09/12   Edmisten, Brooke O, PA-C  AYR SALINE NASAL DROPS NA Place 2 sprays into both nostrils daily.    [provider]  Camphor-Eucalyptus-Menthol (VICKS VAPORUB EX) Apply 1 application topically daily.    [provider]  cyanocobalamin (,VITAMIN B-12,) 1000 MCG/ML injection 1 ml IM qd x 7d, then 1 ml IM q7d x 4 12/10/18   McGowen, Adrian Blackwater, MD  doxazosin (CARDURA) 2 MG tablet Take 2 mg by mouth 2 (two) times daily.  07/19/16   [provider]  fluticasone (FLONASE) 50 MCG/ACT nasal spray one spray by Both Nostrils route daily. 05/23/18 05/23/19  [provider]  hydrochlorothiazide (HYDRODIURIL) 25 MG tablet TAKE 1 TABLET BY MOUTH DAILY 03/18/19   McGowen, Adrian Blackwater, MD  levothyroxine (SYNTHROID, LEVOTHROID) 125 MCG tablet Take 1 tablet (125 mcg total) by mouth daily. 09/11/18   McGowen, Adrian Blackwater, MD  LORazepam (ATIVAN) 0.5 MG tablet Take 1 tablet (0.5 mg total) by mouth 2 (two) times daily as needed for anxiety. 12/20/18   McGowen, Adrian Blackwater, MD  losartan (COZAAR) 100 MG tablet Take 1 tablet (100 mg total) by mouth daily. 01/10/19   McGowen, Adrian Blackwater, MD  Magnesium Oxide 500 MG TABS Take 1 tablet by mouth daily.    [provider]  meclizine (ANTIVERT) 25 MG tablet Take 1 tablet (25 mg total) by mouth 3 (three) times daily as needed for dizziness or nausea. 07/30/15   Noe Gens, PA-C  mycophenolate (MYFORTIC) 360 MG TBEC EC tablet Take 360 mg by mouth 2 (two) times daily.    [provider]  predniSONE (DELTASONE) 50 MG tablet One tab PO daily for 5 days. Patient not taking: Reported on 02/18/2019 02/05/19   Silverio Decamp, MD  silver sulfADIAZINE (SILVADENE) 1 % cream Apply 1 application topically daily. Mixes with Triamcinolone cream    [provider]  SODIUM BICARBONATE PO Take 650 mg by mouth 2 (two) times daily. Takes 2 in AM and 2 at bedtime    [provider]  sulfamethoxazole-trimethoprim (BACTRIM) 400-80 MG tablet  02/10/19   [provider]  tacrolimus (PROGRAF) 1 MG capsule Take 4 mg by mouth 2 (two) times daily. Pt takes 3 in the morning and 3 at night.    [provider]  triamcinolone (NASACORT ALLERGY 24HR) 55 MCG/ACT AERO nasal inhaler Place 2 sprays into the nose daily.    [provider]  triamcinolone cream (KENALOG) 0.1 % Apply 1 application topically daily.    [provider]  verapamil (CALAN-SR) 120 MG CR tablet 1 tab po every afternoon 12/17/18   McGowen, Adrian Blackwater, MD  verapamil (CALAN-SR) 240 MG CR tablet 1 tab po every morning 11/12/18   McGowen, Adrian Blackwater, MD    Family History Family History  Problem Relation Age of Onset  . Deep vein thrombosis Mother        post thyroid surgery  . Hypertension Mother   . Heart attack Father 90       deceased  . Hypertension Father   . Cancer Sister        breast  . Cancer Paternal Aunt        pancreatic  . Diabetes Maternal Grandfather   . Stroke Paternal Grandmother        in 27s  . Colon cancer Neg Hx   . Esophageal cancer Neg Hx   . Stomach cancer Neg Hx   . Rectal cancer Neg Hx     Social History Social History   Tobacco Use  . Smoking status: Never Smoker  . Smokeless tobacco: Never Used  Substance Use Topics  . Alcohol use: Yes    Comment: rarely  . Drug use: No     Allergies   Cefaclor, Naproxen, Enalapril maleate, and Metoprolol tartrate   Review of Systems Review of Systems  Constitutional: Negative for activity change, appetite change, chills, diaphoresis, fatigue and fever.  HENT:       Tongue and gingiva stinging sensation  Eyes: Negative.   Respiratory: Negative.   Cardiovascular: Negative.   Gastrointestinal: Negative.   Genitourinary: Negative.  Musculoskeletal: Negative.    Skin: Negative.   Neurological:       Burning in feet     Physical Exam Triage Vital Signs ED Triage Vitals  Enc Vitals Group     BP 03/24/19 1619 (!) 146/76     Pulse Rate 03/24/19 1619 74     Resp --      Temp 03/24/19 1619 98.1 F (36.7 C)     Temp Source 03/24/19 1619 Oral     SpO2 03/24/19 1619 95 %     Weight 03/24/19 1620 242 lb 8.1 oz (110 kg)     Height 03/24/19 1620 5' 5.5" (1.664 m)     Head Circumference --      Peak Flow --      Pain Score --      Pain Loc --      Pain Edu? --      Excl. in [place]? --    No data found.  Updated Vital Signs BP (!) 146/76 (BP Location: Left Arm)   Pulse 74   Temp 98.1 F (36.7 C) (Oral)   Ht 5' 5.5" (1.664 m)   Wt 110 kg   SpO2 95%   BMI 39.74 kg/m   Visual Acuity Right Eye Distance:   Left Eye Distance:   Bilateral Distance:    Right Eye Near:   Left Eye Near:    Bilateral Near:     Physical Exam Nursing notes and Vital Signs reviewed. Appearance:  Patient appears stated age, and in no acute distress Eyes:  Pupils are equal, round, and reactive to light and accomodation.  Extraocular movement is intact.  Conjunctivae are not inflamed  Nose:  Normal turbinates.  No sinus tenderness.   Mouth:  No lesions or erythema.  No white plaques present.  Tongue, teeth, and gingiva appear normal. Pharynx:  Normal Neck:  Supple.  No adenopathy or thyromegaly.   Lungs:  Clear to auscultation.  Breath sounds are equal.  Moving air well. Heart:  Regular rate and rhythm without murmurs, rubs, or gallops.  Abdomen:  Nontender without masses or hepatosplenomegaly.  Bowel sounds are present.  No CVA or flank tenderness.  Extremities:  No edema.  No erythema or tenderness plantar surfaces of feet.  Pedal pulses intact. Skin:  No rash present.    UC Treatments / Results  Labs (all labs ordered are listed, but only abnormal results are displayed) Labs Reviewed - No data to display  EKG   Radiology No results found.   Procedures Procedures (including critical care time)  Medications Ordered in UC Medications - No data to display  Initial Impression / Assessment and Plan / UC Course  I have reviewed the triage vital signs and the nursing notes.  Pertinent labs & imaging results that were available during my care of the patient were reviewed by me and considered in my medical decision making (see chart for details).    No evidence of oral candidiasis on oral exam. Suspect neuropathy bilateral plantar surfaces resulting from B12 deficiency. Followup with Family Doctor if not improved in about 2 weeks  Final Clinical Impressions(s) / UC Diagnoses   Final diagnoses:  Glossitis  Peripheral sensory neuropathy     Discharge Instructions     Recommend taking a daily multiple vitamin.    ED Prescriptions    None        Kandra Nicolas, MD 03/26/19 1204

## 2019-03-24 NOTE — ED Triage Notes (Signed)
Patient reports intensified itching of tongue and gum areas; wonders if it is thrush; numerous medications and compromised immune system. Also states bottom of both feet burn/sore.  She has not travelled past 4 weeks.

## 2019-03-24 NOTE — Discharge Instructions (Signed)
Recommend taking a daily multiple vitamin.

## 2019-03-26 ENCOUNTER — Other Ambulatory Visit: Payer: Self-pay | Admitting: Family Medicine

## 2019-03-28 ENCOUNTER — Other Ambulatory Visit: Payer: Self-pay

## 2019-03-28 ENCOUNTER — Ambulatory Visit (INDEPENDENT_AMBULATORY_CARE_PROVIDER_SITE_OTHER): Payer: Medicare Other | Admitting: Family Medicine

## 2019-03-28 DIAGNOSIS — N183 Chronic kidney disease, stage 3 unspecified: Secondary | ICD-10-CM

## 2019-03-28 DIAGNOSIS — E538 Deficiency of other specified B group vitamins: Secondary | ICD-10-CM

## 2019-03-28 LAB — BASIC METABOLIC PANEL
BUN: 23 mg/dL (ref 6–23)
CO2: 27 mEq/L (ref 19–32)
Calcium: 9.5 mg/dL (ref 8.4–10.5)
Chloride: 103 mEq/L (ref 96–112)
Creatinine, Ser: 1.22 mg/dL — ABNORMAL HIGH (ref 0.40–1.20)
GFR: 43.14 mL/min — ABNORMAL LOW (ref >60.00)
Glucose, Bld: 109 mg/dL — ABNORMAL HIGH (ref 70–99)
Potassium: 4 mEq/L (ref 3.5–5.1)
Sodium: 140 mEq/L (ref 135–145)

## 2019-03-28 LAB — VITAMIN B12: Vitamin B-12: 1156 pg/mL — ABNORMAL HIGH (ref 211–911)

## 2019-03-28 LAB — MAGNESIUM: Magnesium: 1.5 mg/dL (ref 1.5–2.5)

## 2019-04-03 ENCOUNTER — Other Ambulatory Visit: Payer: Self-pay | Admitting: Family Medicine

## 2019-04-13 ENCOUNTER — Other Ambulatory Visit: Payer: Self-pay | Admitting: Family Medicine

## 2019-05-09 DIAGNOSIS — R262 Difficulty in walking, not elsewhere classified: Secondary | ICD-10-CM | POA: Diagnosis not present

## 2019-05-09 DIAGNOSIS — L602 Onychogryphosis: Secondary | ICD-10-CM | POA: Diagnosis not present

## 2019-05-17 ENCOUNTER — Other Ambulatory Visit: Payer: Self-pay | Admitting: Family Medicine

## 2019-05-21 ENCOUNTER — Other Ambulatory Visit: Payer: Self-pay

## 2019-05-21 ENCOUNTER — Ambulatory Visit (INDEPENDENT_AMBULATORY_CARE_PROVIDER_SITE_OTHER): Payer: Medicare Other | Admitting: Family Medicine

## 2019-05-21 ENCOUNTER — Encounter: Payer: Self-pay | Admitting: Family Medicine

## 2019-05-21 VITALS — BP 117/76 | HR 80 | Temp 98.3°F | Resp 16 | Ht 65.0 in | Wt 240.6 lb

## 2019-05-21 DIAGNOSIS — Z23 Encounter for immunization: Secondary | ICD-10-CM | POA: Diagnosis not present

## 2019-05-21 DIAGNOSIS — I10 Essential (primary) hypertension: Secondary | ICD-10-CM

## 2019-05-21 DIAGNOSIS — F411 Generalized anxiety disorder: Secondary | ICD-10-CM

## 2019-05-21 DIAGNOSIS — E538 Deficiency of other specified B group vitamins: Secondary | ICD-10-CM

## 2019-05-21 DIAGNOSIS — N183 Chronic kidney disease, stage 3 unspecified: Secondary | ICD-10-CM | POA: Diagnosis not present

## 2019-05-21 DIAGNOSIS — E039 Hypothyroidism, unspecified: Secondary | ICD-10-CM

## 2019-05-21 LAB — MAGNESIUM: Magnesium: 1.5 mg/dL (ref 1.5–2.5)

## 2019-05-21 LAB — VITAMIN B12: Vitamin B-12: 701 pg/mL (ref 211–911)

## 2019-05-21 NOTE — Progress Notes (Signed)
OFFICE VISIT  05/21/2019   CC:  Chief Complaint  Patient presents with  . Follow-up    RCI, pt is fasting    HPI:    Patient is a 73 y.o. Caucasian female who presents for 3 mo f/u HTN, vit B12 def, hypothyroidism. She has CRI III and hx of renal transplant. Last visit 3 mo ago I saw her for her CPE and all labs were stable. We decided to stop her IM B12 and start PO dosing and see how her levels were today at this f/u. She is doing fine, dealing with her anxiety most of the time w/out taking any lorazepam-takes avg of 1 pill per week.  Takes her T4 on empty stomach w/out any other meds.  TSH 3 mo ago good. Occ home bp and HR checks are normal. She takes the B12 1000 mcg only a couple days a week avg b/c she can't remember to take it well at this time.  Since I last saw her she has been getting PT for low back pain -via her sports med MD, Dr. Jefm Bryant it is not working and she stopped going a couple weeks ago, says she thinks she needs "a shot in my back".  Taking magnesium [phone] qd, needs level checked. Has f/u with nephrologist soon, goes to get her routine labs for them tomorrow.  Past Medical History:  Diagnosis Date  . Anemia of chronic kidney failure    r/t kidney function- receives Procrit  . Anxiety   . Cancer ([place])   . Chronic renal insufficiency, stage III (moderate) 10/2015   Post-transplant Cr 1.3, GFR 41 as of June 2017 f/u w/ [facility]  . CMV infection [facility]) summer 2017   Valcyte per ID/Renal transplant team  . Diverticulosis    a. 05/2012 colonoscopy  . Erosive lichen planus of vulva    Topical steroids (managed by Dr. Mila Palmer [facility].  . ESRD (end stage renal disease) ([place])    began dialysis 2014--followed by Dr. [name].  Received deceased donor kidney transplant 10/2015.  [name] FSGS (focal segmental glomerulosclerosis)    right kidney; hx of left renal cell cancer and got nephrectomy  1993.  [name] GERD (gastroesophageal reflux disease)   . Gout   . Heart murmur    (Diastolic) ECHO 02/349 showed that this murmur is coming from pt's R arm AV fistula  . Hiatal hernia   . History of renal cell cancer 1993   Left nephrectomy  . Hyperlipidemia   . Hypertension   . Hypothyroidism   . Impingement syndrome of left shoulder 04/2016   Dr. Christy Sartorius to PT  . Lumbar spinal stenosis    Chronic LBP with bilat neurogenic claudication  . Metatarsal fracture, pathologic 01/2018   Right; orthocarolina-->post op shoe continued, f/u x-ray planned.  . Osteoarthritis of left knee 08/2017   severe, diffuse, tricompartmental.  Responded to steroid injection.    . Osteoporosis 06/07/2016   DEXA T-score of -3.1.  Fosamax planned 2017 but dental work prevented start..  Pathologic toe fx 12/2017--repeat DEXA 08/2018, T-score -3.3 radius.  . Renal transplant recipient 11/06/2015   Baseline Cr as of 02/2018= 1.0-1.2.  . SVT (supraventricular tachycardia) ([place])   . Vitamin B12 deficiency 12/2018   Starting replacement as of 12/11/2018    Past Surgical History:  Procedure Laterality Date  . AV FISTULA PLACEMENT Right    Right arm: aneurismal dilatation 2017 being followed by CV surgeons  .  CARDIOVASCULAR STRESS TEST     03/10/15 ETT ([facility]): Exercise ECG negative at 83% max predicted HR.   [name] COLONOSCOPY  2003; 05/2012   diverticulosis, no polyps.  Recall 10 yrs  . colonoscopy with polypectomy     Dr Henrene Pastor  . DEXA  06/07/2016; 08/22/2018   2017 T-score -3.1.  2020 T score -3.3  . KIDNEY TRANSPLANT  08-Nov-2015   Deceased donor kidney transplant, with Thymoglobulin induction  . LIGATION OF ARTERIOVENOUS  FISTULA Right 02/14/2017   Procedure: LIGATION OF ARTERIOVENOUS  FISTULA;  Surgeon: Angelia Mould, MD;  Location: [place];  Service: Vascular;  Laterality: Right;  . NEPHRECTOMY  1993   for malignancy- left  . RENAL BIOPSY     right  . RESECTION OF ARTERIOVENOUS FISTULA ANEURYSM  Right 02/14/2017   Procedure: EXCISION OF RIGHT BRACHIOCEPHALIC ARTERIOVENOUS FISTULA ANEURYSM;  Surgeon: Angelia Mould, MD;  Location: [place];  Service: Vascular;  Laterality: Right;  . TEE WITHOUT CARDIOVERSION N/A 08/27/2013   Procedure: TRANSESOPHAGEAL ECHOCARDIOGRAM (TEE);  Surgeon: [name], MD;  Location: [facility] ENDOSCOPY;  Service: Cardiovascular;  Laterality: N/A;  . TOTAL ABDOMINAL HYSTERECTOMY W/ BILATERAL SALPINGOOPHORECTOMY  1997   fibroids  . TRANSTHORACIC ECHOCARDIOGRAM     03/10/15 echo ([facility]): LV cavity normal in size, focal basal hypertrophy, normal systolic function, EF 83% (visual est). Normal wall motion, no regional wall motion abnormalities. Mild diastolic dysfunction with normal LA chamber size. No significant valve stenosis or regurgitation.  . VULVA / PERINEUM BIOPSY  2015    Outpatient Medications Prior to Visit  Medication Sig Dispense Refill  . acetaminophen (TYLENOL) 500 MG tablet Take 500-750 mg by mouth every 6 (six) hours as needed for pain.    [name] aspirin EC 81 MG EC tablet Take 1 tablet (81 mg total) by mouth daily.    Skipper Cliche SALINE NASAL DROPS NA Place 2 sprays into both nostrils daily.    . Camphor-Eucalyptus-Menthol (VICKS VAPORUB EX) Apply 1 application topically daily.    [name] doxazosin (CARDURA) 2 MG tablet Take 2 mg by mouth 2 (two) times daily.     . fluticasone (FLONASE) 50 MCG/ACT nasal spray one spray by Both Nostrils route daily.    . hydrochlorothiazide (HYDRODIURIL) 25 MG tablet TAKE 1 TABLET BY MOUTH DAILY 90 tablet 1  . levothyroxine (SYNTHROID) 125 MCG tablet TAKE 1 TABLET BY MOUTH DAILY 90 tablet 1  . LORazepam (ATIVAN) 0.5 MG tablet Take 1 tablet (0.5 mg total) by mouth 2 (two) times daily as needed for anxiety. 30 tablet 1  . losartan (COZAAR) 100 MG tablet Take 1 tablet (100 mg total) by mouth daily. 90 tablet 3  . Magnesium Oxide 500 MG TABS Take 1 tablet by mouth daily.    . meclizine (ANTIVERT) 25 MG tablet  Take 1 tablet (25 mg total) by mouth 3 (three) times daily as needed for dizziness or nausea. 20 tablet 0  . mycophenolate (MYFORTIC) 360 MG TBEC EC tablet Take 360 mg by mouth 2 (two) times daily.    . silver sulfADIAZINE (SILVADENE) 1 % cream Apply 1 application topically daily. Mixes with Triamcinolone cream    . SODIUM BICARBONATE PO Take 650 mg by mouth 2 (two) times daily. Takes 2 in AM and 2 at bedtime    . sulfamethoxazole-trimethoprim (BACTRIM) 400-80 MG tablet     . tacrolimus (PROGRAF) 1 MG capsule Take 4 mg by mouth 2 (two) times daily. Pt takes 3 in  the morning and 3 at night.    . triamcinolone (NASACORT ALLERGY 24HR) 55 MCG/ACT AERO nasal inhaler Place 2 sprays into the nose daily.    [name] triamcinolone cream (KENALOG) 0.1 % Apply 1 application topically daily.    . verapamil (CALAN-SR) 120 MG CR tablet TAKE 1 TABLET BY MOUTH EVERY AFTERNOON 90 tablet 1  . verapamil (CALAN-SR) 240 MG CR tablet 1 tab po every morning 90 tablet 1  . cyanocobalamin (,VITAMIN B-12,) 1000 MCG/ML injection 1 ml IM qd x 7d, then 1 ml IM q7d x 4 (Patient not taking: Reported on 05/21/2019) 11 mL 0  . predniSONE (DELTASONE) 50 MG tablet One tab PO daily for 5 days. (Patient not taking: Reported on 05/21/2019) 5 tablet 0   No facility-administered medications prior to visit.     Allergies  Allergen Reactions  . Cefaclor Rash  . Naproxen Rash  . Enalapril Maleate Cough    Vasotec  . Metoprolol Tartrate Rash    On legs    ROS As per HPI  PE: Blood pressure 117/76, pulse 80, temperature 98.3 F (36.8 C), temperature source Temporal, resp. rate 16, height 5\' 5"  (1.651 m), weight 240 lb 9.6 oz (109.1 kg), SpO2 95 %. Body mass index is 40.04 kg/m.  Gen: Alert, well appearing.  Patient is oriented to person, place, time, and situation. AFFECT: pleasant, lucid thought and speech. CV: RRR, no m/r/g.   LUNGS: CTA bilat, nonlabored resps, good aeration in all lung fields. EXT: no clubbing or cyanosis.   no edema.    LABS:  Lab Results  Component Value Date   TSH 0.94 02/18/2019   Lab Results  Component Value Date   WBC 6.3 02/18/2019   HGB 13.4 02/18/2019   HCT 40.5 02/18/2019   MCV 92.8 02/18/2019   PLT 247.0 02/18/2019   Lab Results  Component Value Date   CREATININE 1.22 (H) 03/28/2019   BUN 23 03/28/2019   NA 140 03/28/2019   K 4.0 03/28/2019   CL 103 03/28/2019   CO2 27 03/28/2019   Lab Results  Component Value Date   ALT 13 02/18/2019   AST 13 02/18/2019   ALKPHOS 77 02/18/2019   BILITOT 0.6 02/18/2019   Lab Results  Component Value Date   CHOL 167 02/18/2019   Lab Results  Component Value Date   HDL 44.90 02/18/2019   Lab Results  Component Value Date   LDLCALC 98 02/18/2019   Lab Results  Component Value Date   TRIG 124.0 02/18/2019   Lab Results  Component Value Date   CHOLHDL 4 02/18/2019   Lab Results  Component Value Date   HGBA1C 5.4 02/23/2012    IMPRESSION AND PLAN:  1) HTN: The current medical regimen is effective;  continue present plan and medications. Labs coming up via nephrologist --tomorrow.  2) Hypoth: taking T4 correctly.  TSH 3 mo ago normal.  3) Vit B12 def: replaced adequately IM. Since switching to PO 2 mo ago she has not been very compliant--about 2 doses per week avg. B12 level check today.  4) Hx of very mildly low Magnesium: taking 500 mg -1000 mg qd. Check mag level today.  5) GAD: she is highly anxious but takes benzo very sporadically. Controlled substance contract reviewed with patient today.  Patient signed this and it will be placed in the chart.   No new rx for this med was needed today.  6) Low back pain w/out radiculopathy: she is followed by Sports  Med MD, Dr. Dianah Field. I recommended she f/u with him b/c she feels like she has not improved with adequate trial of PT.  An After Visit Summary was printed and given to the patient.  FOLLOW UP: Return in about 3 months (around  08/21/2019).  Signed:  Crissie Sickles, MD           05/21/2019

## 2019-05-21 NOTE — Addendum Note (Signed)
Addended by: Deveron Furlong D on: 05/21/2019 08:40 AM   Modules accepted: Orders

## 2019-05-22 DIAGNOSIS — I129 Hypertensive chronic kidney disease with stage 1 through stage 4 chronic kidney disease, or unspecified chronic kidney disease: Secondary | ICD-10-CM | POA: Diagnosis not present

## 2019-05-22 DIAGNOSIS — Z940 Kidney transplant status: Secondary | ICD-10-CM | POA: Diagnosis not present

## 2019-05-31 DIAGNOSIS — L304 Erythema intertrigo: Secondary | ICD-10-CM | POA: Diagnosis not present

## 2019-05-31 DIAGNOSIS — L438 Other lichen planus: Secondary | ICD-10-CM | POA: Diagnosis not present

## 2019-06-06 ENCOUNTER — Other Ambulatory Visit: Payer: Self-pay

## 2019-06-11 ENCOUNTER — Encounter: Payer: Self-pay | Admitting: Family Medicine

## 2019-06-12 ENCOUNTER — Telehealth: Payer: Self-pay | Admitting: Family Medicine

## 2019-06-12 ENCOUNTER — Other Ambulatory Visit: Payer: Self-pay

## 2019-06-12 MED ORDER — HYDROCHLOROTHIAZIDE 25 MG PO TABS: 25.0000 mg | ORAL_TABLET | Freq: Every day | ORAL | 1 refills | Status: DC

## 2019-06-12 NOTE — Telephone Encounter (Signed)
Spoke with [place], pharmacy tech regarding refill. They never received Rx from 03/18/19 (90,1). New Rx sent in and patient was notified.

## 2019-06-12 NOTE — Telephone Encounter (Signed)
Patient is requesting hydrochlorothiazide (HYDRODIURIL) 25 MG tabletto be sent to [facility]. Pharmacy advised patient that they have requesting the Rx but has not received a response from our office.

## 2019-06-17 ENCOUNTER — Ambulatory Visit (INDEPENDENT_AMBULATORY_CARE_PROVIDER_SITE_OTHER): Payer: Medicare Other

## 2019-06-17 ENCOUNTER — Other Ambulatory Visit: Payer: Self-pay

## 2019-06-17 VITALS — BP 142/88 | HR 68 | Wt 246.1 lb

## 2019-06-17 DIAGNOSIS — Z0000 Encounter for general adult medical examination without abnormal findings: Secondary | ICD-10-CM

## 2019-06-17 DIAGNOSIS — Z23 Encounter for immunization: Secondary | ICD-10-CM

## 2019-06-17 MED ORDER — SHINGRIX 50 MCG/0.5ML IM SUSR: 0.5000 mL | Freq: Once | INTRAMUSCULAR | 1 refills | Status: AC

## 2019-06-17 NOTE — Progress Notes (Signed)
Subjective:   JENAN ELLEGOOD is a 73 y.o. female who presents for Medicare Annual (Subsequent) preventive examination.  Review of Systems:  No ROS.  Medicare Wellness Visit.  See social history for additional risk factors.  Cardiac Risk Factors include: advanced age (>34men, >68 women);obesity (BMI >30kg/m2);smoking/ tobacco exposure;sedentary lifestyle;hypertension;dyslipidemia;family history of premature cardiovascular disease   Sleep patterns: Sleeps 6 hours. Up to void 2-3 times.  Home Safety/Smoke Alarms: Feels safe in home. Smoke alarms in place.  Living environment; residence and Firearm Safety: Lives with husband in 1 story home.  Seat Belt Safety/Bike Helmet: Wears seat belt.   Female:   Pap-N/A      Mammo-08/22/2018, BI-RADS CATEGORY  1: Negative.      Dexa scan-08/22/2018, Osteoporosis.     CCS-Colonoscopy 06/07/2012, normal.     Objective:     Vitals: BP (!) 142/88 (BP Location: Left Arm, Patient Position: Sitting, Cuff Size: Normal)   Pulse 68   Wt 246 lb 2 oz (111.6 kg)   SpO2 97%   BMI 40.96 kg/m   Body mass index is 40.96 kg/m.  Advanced Directives 06/17/2019 02/12/2019 06/11/2018 06/06/2017 03/15/2017 02/22/2017 02/08/2017  Does Patient Have a Medical Advance Directive? Yes Yes Yes No No No No  Type of Advance Directive Living will;Healthcare Power of [place];Living will Living will;Healthcare Power of Attorney - - - -  Copy of [place] in Chart? No - copy requested No - copy requested No - copy requested - - - -  Would patient like information on creating a medical advance directive? - - - No - Patient declined - - No - Patient declined  Pre-existing out of facility DNR order (yellow form or pink MOST form) - - - - - - -    Tobacco Social History   Tobacco Use  Smoking Status Never Smoker  Smokeless Tobacco Never Used     Counseling given: Not Answered    Past Medical History:  Diagnosis Date  .  Anemia of chronic kidney failure    r/t kidney function- receives Procrit  . Anxiety   . Chronic renal insufficiency, stage III (moderate) 10/2015   Post-transplant Cr 1.3, GFR 41 as of June 2017 f/u w/ [facility]  . CMV infection [facility]) summer 2017   Valcyte per ID/Renal transplant team  . Diverticulosis    a. 05/2012 colonoscopy  . Erosive lichen planus of vulva    Topical steroids (managed by Dr. Mila Palmer [facility] via [facility] outpt services.  . ESRD (end stage renal disease) ([place])    began dialysis 2014--followed by Dr. [name].  Received deceased donor kidney transplant 10/2015.  [name] FSGS (focal segmental glomerulosclerosis)    right kidney; hx of left renal cell cancer and got nephrectomy 1993.  [name] GERD (gastroesophageal reflux disease)   . Gout   . Heart murmur    (Diastolic) ECHO 02/5101 showed that this murmur is coming from pt's R arm AV fistula  . Hiatal hernia   . History of renal cell cancer 1993   Left nephrectomy  . Hyperlipidemia   . Hypertension   . Hypothyroidism   . Impingement syndrome of left shoulder 04/2016   Dr. Christy Sartorius to PT  . Lumbar spinal stenosis    Chronic LBP with bilat neurogenic claudication  . Metatarsal fracture, pathologic 01/2018   Right; orthocarolina-->post op shoe continued, f/u x-ray planned.  . Osteoarthritis of left knee 08/2017   severe, diffuse, tricompartmental.  Responded to steroid injection.    . Osteoporosis 06/07/2016   DEXA T-score of -3.1.  Fosamax planned 2017 but dental work prevented start..  Pathologic toe fx 12/2017--repeat DEXA 08/2018, T-score -3.3 radius.  . Renal transplant recipient 08-Nov-2015   Baseline Cr as of 02/2018= 1.0-1.2.  . SVT (supraventricular tachycardia) ([place])   . Vitamin B12 deficiency 12/2018   Starting replacement as of 12/11/2018   Past Surgical History:  Procedure Laterality Date  . AV FISTULA PLACEMENT Right    Right arm: aneurismal dilatation 2017 being  followed by CV surgeons  . CARDIOVASCULAR STRESS TEST     03/10/15 ETT ([facility]): Exercise ECG negative at 83% max predicted HR.   [name] COLONOSCOPY  2003; 05/2012   diverticulosis, no polyps.  Recall 10 yrs  . colonoscopy with polypectomy     Dr Henrene Pastor  . DEXA  06/07/2016; 08/22/2018   2017 T-score -3.1.  2020 T score -3.3  . KIDNEY TRANSPLANT  08-Nov-2015   Deceased donor kidney transplant, with Thymoglobulin induction  . LIGATION OF ARTERIOVENOUS  FISTULA Right 02/14/2017   Procedure: LIGATION OF ARTERIOVENOUS  FISTULA;  Surgeon: Angelia Mould, MD;  Location: [place];  Service: Vascular;  Laterality: Right;  . NEPHRECTOMY  1993   for malignancy- left  . RENAL BIOPSY     right  . RESECTION OF ARTERIOVENOUS FISTULA ANEURYSM Right 02/14/2017   Procedure: EXCISION OF RIGHT BRACHIOCEPHALIC ARTERIOVENOUS FISTULA ANEURYSM;  Surgeon: Angelia Mould, MD;  Location: [place];  Service: Vascular;  Laterality: Right;  . TEE WITHOUT CARDIOVERSION N/A 08/27/2013   Procedure: TRANSESOPHAGEAL ECHOCARDIOGRAM (TEE);  Surgeon: [name], MD;  Location: [facility] ENDOSCOPY;  Service: Cardiovascular;  Laterality: N/A;  . TOTAL ABDOMINAL HYSTERECTOMY W/ BILATERAL SALPINGOOPHORECTOMY  1997   fibroids  . TRANSTHORACIC ECHOCARDIOGRAM     03/10/15 echo ([facility]): LV cavity normal in size, focal basal hypertrophy, normal systolic function, EF 09% (visual est). Normal wall motion, no regional wall motion abnormalities. Mild diastolic dysfunction with normal LA chamber size. No significant valve stenosis or regurgitation.  . VULVA / PERINEUM BIOPSY  2015   Family History  Problem Relation Age of Onset  . Deep vein thrombosis Mother        post thyroid surgery  . Hypertension Mother   . Heart attack Father 34       deceased  . Hypertension Father   . Cancer Sister        breast  . Cancer Paternal Aunt        pancreatic  . Diabetes Maternal Grandfather   . Stroke Paternal Grandmother         in 92s  . Fibroids Daughter   . Colon cancer Neg Hx   . Esophageal cancer Neg Hx   . Stomach cancer Neg Hx   . Rectal cancer Neg Hx    Social History   Socioeconomic History  . Marital status: Married    Spouse name: Not on file  . Number of children: 2  . Years of education: Not on file  . Highest education level: Not on file  Occupational History  . Occupation: Retired  Scientific laboratory technician  . Financial resource strain: Not on file  . Food insecurity    Worry: Not on file    Inability: Not on file  . Transportation needs    Medical: Not on file    Non-medical: Not on file  Tobacco Use  . Smoking status: Never Smoker  .  Smokeless tobacco: Never Used  Substance and Sexual Activity  . Alcohol use: Yes    Comment: rarely  . Drug use: No  . Sexual activity: Not Currently    Birth control/protection: Surgical  Lifestyle  . Physical activity    Days per week: Not on file    Minutes per session: Not on file  . Stress: Not on file  Relationships  . Social Herbalist on phone: Not on file    Gets together: Not on file    Attends religious service: Not on file    Active member of club or organization: Not on file    Attends meetings of clubs or organizations: Not on file    Relationship status: Not on file  Other Topics Concern  . Not on file  Social History Narrative   Lives in [place] with husband.  Retired.  Previously worked in [employer] @ [employer]   No T/A/Ds.    Outpatient Encounter Medications as of 06/17/2019  Medication Sig  . acetaminophen (TYLENOL) 500 MG tablet Take 500-750 mg by mouth every 6 (six) hours as needed for pain.  [name] aspirin EC 81 MG EC tablet Take 1 tablet (81 mg total) by mouth daily.  Skipper Cliche SALINE NASAL DROPS NA Place 2 sprays into both nostrils daily.  . Camphor-Eucalyptus-Menthol (VICKS VAPORUB EX) Apply 1 application topically daily.  [name] doxazosin (CARDURA) 2 MG tablet Take 2 mg by mouth 2 (two) times daily.   .  hydrochlorothiazide (HYDRODIURIL) 25 MG tablet Take 1 tablet (25 mg total) by mouth daily.  [name] levothyroxine (SYNTHROID) 125 MCG tablet TAKE 1 TABLET BY MOUTH DAILY  . LORazepam (ATIVAN) 0.5 MG tablet Take 1 tablet (0.5 mg total) by mouth 2 (two) times daily as needed for anxiety.  [name] losartan (COZAAR) 100 MG tablet Take 1 tablet (100 mg total) by mouth daily.  . Magnesium Oxide 500 MG TABS Take 1 tablet by mouth daily.  . meclizine (ANTIVERT) 25 MG tablet Take 1 tablet (25 mg total) by mouth 3 (three) times daily as needed for dizziness or nausea.  . mycophenolate (MYFORTIC) 360 MG TBEC EC tablet Take 360 mg by mouth 2 (two) times daily.  . silver sulfADIAZINE (SILVADENE) 1 % cream Apply 1 application topically daily. Mixes with Triamcinolone cream  . SODIUM BICARBONATE PO Take 650 mg by mouth 2 (two) times daily. Takes 2 in AM and 2 at bedtime  . sulfamethoxazole-trimethoprim (BACTRIM) 400-80 MG tablet Take 1 tablet by mouth daily.  . tacrolimus (PROGRAF) 1 MG capsule Take 4 mg by mouth 2 (two) times daily. Pt takes 3 in the morning and 3 at night.  . triamcinolone (NASACORT ALLERGY 24HR) 55 MCG/ACT AERO nasal inhaler Place 2 sprays into the nose daily.  [name] triamcinolone cream (KENALOG) 0.1 % Apply 1 application topically daily.  . verapamil (CALAN-SR) 120 MG CR tablet TAKE 1 TABLET BY MOUTH EVERY AFTERNOON  . verapamil (CALAN-SR) 240 MG CR tablet 1 tab po every morning  . [DISCONTINUED] sulfamethoxazole-trimethoprim (BACTRIM) 400-80 MG tablet   . Zoster Vaccine Adjuvanted [facility]) injection Inject 0.5 mLs into the muscle once for 1 dose.  . [DISCONTINUED] cyanocobalamin (,VITAMIN B-12,) 1000 MCG/ML injection 1 ml IM qd x 7d, then 1 ml IM q7d x 4 (Patient not taking: Reported on 05/21/2019)   No facility-administered encounter medications on file as of 06/17/2019.     Activities of Daily Living In your present state of health, do you have any difficulty  performing the following activities:  06/17/2019  Hearing? N  Vision? N  Difficulty concentrating or making decisions? N  Walking or climbing stairs? N  Dressing or bathing? N  Doing errands, shopping? N  Preparing Food and eating ? N  Using the Toilet? N  In the past six months, have you accidently leaked urine? N  Do you have problems with loss of bowel control? N  Managing your Medications? N  Managing your Finances? N  Housekeeping or managing your Housekeeping? N  Some recent data might be hidden    Patient Care Team: Tammi Sou, MD as PCP - General (Family Medicine) Irene Shipper, MD as Consulting Physician (Gastroenterology) Dene Gentry, MD as Consulting Physician (Sports Medicine) Elam Dutch, MD as Consulting Physician (Vascular Surgery) Angelia Mould, MD as Consulting Physician (Vascular Surgery) Florinda Marker as Physician Assistant (Orthopedic Surgery) Pichardo-[facility]er, Mila Palmer, MD as Consulting Physician (Dermatology) Santiago Glad, PA-C as Consulting Physician (Orthopedic Surgery) Evans Lance, MD as Consulting Physician (Cardiology) Justin Mend, MD as Consulting Physician (Nephrology) Silverio Decamp, MD as Consulting Physician (Family Medicine)    Assessment:   This is a routine wellness examination for Roselina.  Exercise Activities and Dietary recommendations Current Exercise Habits: The patient does not participate in regular exercise at present, Exercise limited by: orthopedic condition(s)   Diet (meal preparation, eat out, water intake, caffeinated beverages, dairy products, fruits and vegetables): Drinks Coke, water and tea.   Breakfast: egg, toast and meat.  Lunch: hamburger Dinner: veggies; protein and veggies.   Goals    . Increase physical activity     Increase activity by starting exercise class at church (silver sneakers).     . Increase physical activity     Increase activity by attending Silver Sneakers classes.     . Patient  Stated     Increase activity.       Fall Risk Fall Risk  06/17/2019 12/24/2018 06/11/2018 06/06/2017 04/18/2016  Falls in the past year? 0 0 0 No No  Number falls in past yr: 0 0 - - -  Injury with Fall? 0 0 - - -  Follow up Falls prevention discussed Falls evaluation completed - - -    Depression Screen PHQ 2/9 Scores 06/17/2019 12/24/2018 06/11/2018 06/06/2017  PHQ - 2 Score 0 0 0 0  PHQ- 9 Score - 0 - -     Cognitive Function MMSE - Mini Mental State Exam 06/17/2019 06/11/2018 06/06/2017  Orientation to time 5 5 5   Orientation to Place 5 5 5   Registration 3 3 3   Attention/ Calculation 5 5 5   Recall 2 2 3   Language- name 2 objects 2 2 2   Language- repeat 1 1 1   Language- follow 3 step command 3 3 3   Language- read & follow direction 1 1 1   Write a sentence 1 1 1   Copy design 1 1 1   Total score 29 29 30         Immunization History  Administered Date(s) Administered  . Fluad Quad(high Dose 65+) 05/21/2019  . Influenza Split 06/01/2011  . Influenza Whole 06/11/2008, 05/25/2009, 06/09/2010  . Influenza, High Dose Seasonal PF 05/08/2013, 06/06/2017  . Influenza-Unspecified 05/18/2015, 05/15/2018  . PPD Test 06/01/2011  . Pneumococcal Conjugate-13 06/06/2017  . Pneumococcal Polysaccharide-23 01/06/2013  . Tdap 09/13/2011  . Varicella 12/24/2013  . Zoster 01/23/2014     Screening Tests Health Maintenance  Topic Date Due  . MAMMOGRAM  08/22/2020  .  TETANUS/TDAP  09/12/2021  . COLONOSCOPY  06/07/2022  . INFLUENZA VACCINE  Completed  . DEXA SCAN  Completed  . Hepatitis C Screening  Completed  . PNA vac Low Risk Adult  Completed        Plan:    Shingles vaccine at pharmacy.   Continue doing brain stimulating activities (puzzles, reading, adult coloring books, staying active) to keep memory sharp.   Bring a copy of your living will and/or healthcare power of attorney to your next office visit.  I have personally reviewed and noted the following in the  patient's chart:   . Medical and social history . Use of alcohol, tobacco or illicit drugs  . Current medications and supplements . Functional ability and status . Nutritional status . Physical activity . Advanced directives . List of other physicians . Hospitalizations, surgeries, and ER visits in previous 12 months . Vitals . Screenings to include cognitive, depression, and falls . Referrals and appointments  In addition, I have reviewed and discussed with patient certain preventive protocols, quality metrics, and best practice recommendations. A written personalized care plan for preventive services as well as general preventive health recommendations were provided to patient.     Gerilyn Nestle, RN  06/17/2019   F/U with PCP 08/2019.

## 2019-06-17 NOTE — Patient Instructions (Addendum)
Shingles vaccine at pharmacy.   Continue doing brain stimulating activities (puzzles, reading, adult coloring books, staying active) to keep memory sharp.   Bring a copy of your living will and/or healthcare power of attorney to your next office visit.   Fall Prevention in the Home, Adult Falls can cause injuries. They can happen to people of all ages. There are many things you can do to make your home safe and to help prevent falls. Ask for help when making these changes, if needed. What actions can I take to prevent falls? General Instructions  Use good lighting in all rooms. Replace any light bulbs that burn out.  Turn on the lights when you go into a dark area. Use night-lights.  Keep items that you use often in easy-to-reach places. Lower the shelves around your home if necessary.  Set up your furniture so you have a clear path. Avoid moving your furniture around.  Do not have throw rugs and other things on the floor that can make you trip.  Avoid walking on wet floors.  If any of your floors are uneven, fix them.  Add color or contrast paint or tape to clearly mark and help you see: ? Any grab bars or handrails. ? First and last steps of stairways. ? Where the edge of each step is.  If you use a stepladder: ? Make sure that it is fully opened. Do not climb a closed stepladder. ? Make sure that both sides of the stepladder are locked into place. ? Ask someone to hold the stepladder for you while you use it.  If there are any pets around you, be aware of where they are. What can I do in the bathroom?      Keep the floor dry. Clean up any water that spills onto the floor as soon as it happens.  Remove soap buildup in the tub or shower regularly.  Use non-skid mats or decals on the floor of the tub or shower.  Attach bath mats securely with double-sided, non-slip rug tape.  If you need to sit down in the shower, use a plastic, non-slip stool.  Install grab bars  by the toilet and in the tub and shower. Do not use towel bars as grab bars. What can I do in the bedroom?  Make sure that you have a light by your bed that is easy to reach.  Do not use any sheets or blankets that are too big for your bed. They should not hang down onto the floor.  Have a firm chair that has side arms. You can use this for support while you get dressed. What can I do in the kitchen?  Clean up any spills right away.  If you need to reach something above you, use a strong step stool that has a grab bar.  Keep electrical cords out of the way.  Do not use floor polish or wax that makes floors slippery. If you must use wax, use non-skid floor wax. What can I do with my stairs?  Do not leave any items on the stairs.  Make sure that you have a light switch at the top of the stairs and the bottom of the stairs. If you do not have them, ask someone to add them for you.  Make sure that there are handrails on both sides of the stairs, and use them. Fix handrails that are broken or loose. Make sure that handrails are as long as the stairways.  Install non-slip stair treads on all stairs in your home.  Avoid having throw rugs at the top or bottom of the stairs. If you do have throw rugs, attach them to the floor with carpet tape.  Choose a carpet that does not hide the edge of the steps on the stairway.  Check any carpeting to make sure that it is firmly attached to the stairs. Fix any carpet that is loose or worn. What can I do on the outside of my home?  Use bright outdoor lighting.  Regularly fix the edges of walkways and driveways and fix any cracks.  Remove anything that might make you trip as you walk through a door, such as a raised step or threshold.  Trim any bushes or trees on the path to your home.  Regularly check to see if handrails are loose or broken. Make sure that both sides of any steps have handrails.  Install guardrails along the edges of any  raised decks and porches.  Clear walking paths of anything that might make someone trip, such as tools or rocks.  Have any leaves, snow, or ice cleared regularly.  Use sand or salt on walking paths during winter.  Clean up any spills in your garage right away. This includes grease or oil spills. What other actions can I take?  Wear shoes that: ? Have a low heel. Do not wear high heels. ? Have rubber bottoms. ? Are comfortable and fit you well. ? Are closed at the toe. Do not wear open-toe sandals.  Use tools that help you move around (mobility aids) if they are needed. These include: ? Canes. ? Walkers. ? Scooters. ? Crutches.  Review your medicines with your doctor. Some medicines can make you feel dizzy. This can increase your chance of falling. Ask your doctor what other things you can do to help prevent falls. Where to find more information  Centers for Disease Control and Prevention, [facility]: https://garcia.biz/  [employer] on Aging: BrainJudge.co.uk Contact a doctor if:  You are afraid of falling at home.  You feel weak, drowsy, or dizzy at home.  You fall at home. Summary  There are many simple things that you can do to make your home safe and to help prevent falls.  Ways to make your home safe include removing tripping hazards and installing grab bars in the bathroom.  Ask for help when making these changes in your home. This information is not intended to replace advice given to you by your health care provider. Make sure you discuss any questions you have with your health care provider. Document Released: 05/21/2009 Document Revised: 11/15/2018 Document Reviewed: 03/09/2017 Elsevier Patient Education  2020 [place] Maintenance, Female Adopting a healthy lifestyle and getting preventive care are important in promoting health and wellness. Ask your health care provider about:  The right schedule for you to have regular tests and  exams.  Things you can do on your own to prevent diseases and keep yourself healthy. What should I know about diet, weight, and exercise? Eat a healthy diet   Eat a diet that includes plenty of vegetables, fruits, low-fat dairy products, and lean protein.  Do not eat a lot of foods that are high in solid fats, added sugars, or sodium. Maintain a healthy weight Body mass index (BMI) is used to identify weight problems. It estimates body fat based on height and weight. Your health care provider can help determine your BMI and help you  achieve or maintain a healthy weight. Get regular exercise Get regular exercise. This is one of the most important things you can do for your health. Most adults should:  Exercise for at least 150 minutes each week. The exercise should increase your heart rate and make you sweat (moderate-intensity exercise).  Do strengthening exercises at least twice a week. This is in addition to the moderate-intensity exercise.  Spend less time sitting. Even light physical activity can be beneficial. Watch cholesterol and blood lipids Have your blood tested for lipids and cholesterol at 73 years of age, then have this test every 5 years. Have your cholesterol levels checked more often if:  Your lipid or cholesterol levels are high.  You are older than 73 years of age.  You are at high risk for heart disease. What should I know about cancer screening? Depending on your health history and family history, you may need to have cancer screening at various ages. This may include screening for:  Breast cancer.  Cervical cancer.  Colorectal cancer.  Skin cancer.  Lung cancer. What should I know about heart disease, diabetes, and high blood pressure? Blood pressure and heart disease  High blood pressure causes heart disease and increases the risk of stroke. This is more likely to develop in people who have high blood pressure readings, are of African descent, or are  overweight.  Have your blood pressure checked: ? Every 3-5 years if you are 62-41 years of age. ? Every year if you are 108 years old or older. Diabetes Have regular diabetes screenings. This checks your fasting blood sugar level. Have the screening done:  Once every three years after age 53 if you are at a normal weight and have a low risk for diabetes.  More often and at a younger age if you are overweight or have a high risk for diabetes. What should I know about preventing infection? Hepatitis B If you have a higher risk for hepatitis B, you should be screened for this virus. Talk with your health care provider to find out if you are at risk for hepatitis B infection. Hepatitis C Testing is recommended for:  Everyone born from [birth year] through 1965.  Anyone with known risk factors for hepatitis C. Sexually transmitted infections (STIs)  Get screened for STIs, including gonorrhea and chlamydia, if: ? You are sexually active and are younger than 73 years of age. ? You are older than 73 years of age and your health care provider tells you that you are at risk for this type of infection. ? Your sexual activity has changed since you were last screened, and you are at increased risk for chlamydia or gonorrhea. Ask your health care provider if you are at risk.  Ask your health care provider about whether you are at high risk for HIV. Your health care provider may recommend a prescription medicine to help prevent HIV infection. If you choose to take medicine to prevent HIV, you should first get tested for HIV. You should then be tested every 3 months for as long as you are taking the medicine. Pregnancy  If you are about to stop having your period (premenopausal) and you may become pregnant, seek counseling before you get pregnant.  Take 400 to 800 micrograms (mcg) of folic acid every day if you become pregnant.  Ask for birth control (contraception) if you want to prevent pregnancy.  Osteoporosis and menopause Osteoporosis is a disease in which the bones lose minerals and strength with  aging. This can result in bone fractures. If you are 29 years old or older, or if you are at risk for osteoporosis and fractures, ask your health care provider if you should:  Be screened for bone loss.  Take a calcium or vitamin D supplement to lower your risk of fractures.  Be given hormone replacement therapy (HRT) to treat symptoms of menopause. Follow these instructions at home: Lifestyle  Do not use any products that contain nicotine or tobacco, such as cigarettes, e-cigarettes, and chewing tobacco. If you need help quitting, ask your health care provider.  Do not use street drugs.  Do not share needles.  Ask your health care provider for help if you need support or information about quitting drugs. Alcohol use  Do not drink alcohol if: ? Your health care provider tells you not to drink. ? You are pregnant, may be pregnant, or are planning to become pregnant.  If you drink alcohol: ? Limit how much you use to 0-1 drink a day. ? Limit intake if you are breastfeeding.  Be aware of how much alcohol is in your drink. In the [place] one drink equals one 12 oz bottle of beer (355 mL), one 5 oz glass of wine (148 mL), or one 1 oz glass of hard liquor (44 mL). General instructions  Schedule regular health, dental, and eye exams.  Stay current with your vaccines.  Tell your health care provider if: ? You often feel depressed. ? You have ever been abused or do not feel safe at home. Summary  Adopting a healthy lifestyle and getting preventive care are important in promoting health and wellness.  Follow your health care provider's instructions about healthy diet, exercising, and getting tested or screened for diseases.  Follow your health care provider's instructions on monitoring your cholesterol and blood pressure. This information is not intended to replace advice given  to you by your health care provider. Make sure you discuss any questions you have with your health care provider. Document Released: 02/07/2011 Document Revised: 07/18/2018 Document Reviewed: 07/18/2018 Elsevier Patient Education  2020 [employer].

## 2019-06-19 NOTE — Progress Notes (Signed)
AWV reviewed and agree. Signed:  Crissie Sickles, MD           06/19/2019

## 2019-06-30 ENCOUNTER — Other Ambulatory Visit: Payer: Self-pay

## 2019-06-30 ENCOUNTER — Emergency Department
Admission: EM | Admit: 2019-06-30 | Discharge: 2019-06-30 | Disposition: A | Discharge status: Home / Self Care | Payer: Medicare Other | Source: Home / Self Care

## 2019-06-30 DIAGNOSIS — R8290 Unspecified abnormal findings in urine: Secondary | ICD-10-CM | POA: Diagnosis not present

## 2019-06-30 DIAGNOSIS — N3000 Acute cystitis without hematuria: Secondary | ICD-10-CM

## 2019-06-30 LAB — POCT URINALYSIS DIP (MANUAL ENTRY)
Bilirubin, UA: NEGATIVE
Glucose, UA: NEGATIVE mg/dL
Ketones, POC UA: NEGATIVE mg/dL
Nitrite, UA: NEGATIVE
Protein Ur, POC: NEGATIVE mg/dL
Spec Grav, UA: 1.015 (ref 1.010–1.025)
Urobilinogen, UA: 0.2 E.U./dL
pH, UA: 6 (ref 5.0–8.0)

## 2019-06-30 MED ORDER — AMOXICILLIN-POT CLAVULANATE 875-125 MG PO TABS: 1.0000 | ORAL_TABLET | Freq: Two times a day (BID) | ORAL | 0 refills | Status: DC

## 2019-06-30 NOTE — Discharge Instructions (Signed)
°  Please take your antibiotic as prescribed. A urine culture has been sent to check the severity of your urinary infection and to determine if you are on the most appropriate antibiotic. The results should come back within 2-3 days and you will be notified even if no medication change is needed.  Please stay well hydrated and follow up with your family doctor in 2-3 days if not improving, sooner if worsening.

## 2019-06-30 NOTE — ED Provider Notes (Signed)
[name]    CSN: [id number] Arrival date & time: 06/30/19  1124      History   Chief Complaint Chief Complaint  Patient presents with  . Urinary Frequency    HPI Tabitha Green is a 73 y.o. female.   HPI Tabitha Green is a 73 y.o. female presenting to UC with c/o urinary frequency that started this morning.  Sensation of not being able to empty her bladder. Denies fever, chills, n/v/d. No belly or back pain.  Denies vaginal symptoms. Hx of UTIs in the past. No medication taken PTA.   Past Medical History:  Diagnosis Date  . Anemia of chronic kidney failure    r/t kidney function- receives Procrit  . Anxiety   . Chronic renal insufficiency, stage III (moderate) 10/2015   Post-transplant Cr 1.3, GFR 41 as of June 2017 f/u w/ [facility]  . CMV infection [facility]) summer 2017   Valcyte per ID/Renal transplant team  . Diverticulosis    a. 05/2012 colonoscopy  . Erosive lichen planus of vulva    Topical steroids (managed by Dr. Mila Palmer [facility] via [facility].  . ESRD (end stage renal disease) ([place])    began dialysis 2014--followed by Dr. [name].  Received deceased donor kidney transplant 10/2015.  [name] FSGS (focal segmental glomerulosclerosis)    right kidney; hx of left renal cell cancer and got nephrectomy 1993.  [name] GERD (gastroesophageal reflux disease)   . Gout   . Heart murmur    (Diastolic) ECHO 04/6788 showed that this murmur is coming from pt's R arm AV fistula  . Hiatal hernia   . History of renal cell cancer 1993   Left nephrectomy  . Hyperlipidemia   . Hypertension   . Hypothyroidism   . Impingement syndrome of left shoulder 04/2016   Dr. Christy Sartorius to PT  . Lumbar spinal stenosis    Chronic LBP with bilat neurogenic claudication  . Metatarsal fracture, pathologic 01/2018   Right; orthocarolina-->post op shoe continued, f/u x-ray planned.  . Osteoarthritis of left knee 08/2017   severe,  diffuse, tricompartmental.  Responded to steroid injection.    . Osteoporosis 06/07/2016   DEXA T-score of -3.1.  Fosamax planned 2017 but dental work prevented start..  Pathologic toe fx 12/2017--repeat DEXA 08/2018, T-score -3.3 radius.  . Renal transplant recipient 11/06/2015   Baseline Cr as of 02/2018= 1.0-1.2.  . SVT (supraventricular tachycardia) ([place])   . Vitamin B12 deficiency 12/2018   Starting replacement as of 12/11/2018    Patient Active Problem List   Diagnosis Date Noted  . Lumbar spinal stenosis 02/05/2019  . Left shoulder pain 05/02/2016  . Wheeze 09/16/2015  . Cough 09/16/2015  . Acute bronchitis 09/16/2015  . History of renal carcinoma 05/14/2015  . Vulvar ulceration 06/22/2014  . End stage renal disease ([place]) 04/09/2014  . Mechanical complication of other vascular device, implant, and graft 04/09/2014  . Cervical spondylosis 10/10/2013  . Aortic insufficiency 08/20/2013  . Hyperglycemia 12/14/2012  . SVT (supraventricular tachycardia) ([place]) 12/13/2012  . Uremia 12/13/2012  . Chronic kidney disease (CKD), stage IV (severe) ([place]) 12/07/2012  . Bradycardia 12/07/2012  . VITAMIN D DEFICIENCY 09/08/2009  . CHRON GLOMERULONEPHRIT W/LES MEMBRANOUS GLN 09/08/2009  . FATIGUE 09/08/2009  . DIVERTICULOSIS, COLON 09/26/2008  . OSTEOPENIA 09/26/2008  . COLONIC POLYPS, HX OF 09/26/2008  . Hypothyroidism 10/02/2007  . OTHER AND UNSPECIFIED HYPERLIPIDEMIA 10/02/2007  . BENIGN POSITIONAL VERTIGO 10/02/2007  . Unspecified essential hypertension  10/02/2007  . Gout, unspecified 03/26/2007  . DISORDER, DYSMETABOLIC SYNDROME X 72/53/6644  . HX, PERSONAL, MALIGNANCY, KIDNEY 03/26/2007  . OSTEOARTHRITIS 12/26/2006    Past Surgical History:  Procedure Laterality Date  . AV FISTULA PLACEMENT Right    Right arm: aneurismal dilatation 2017 being followed by CV surgeons  . CARDIOVASCULAR STRESS TEST     03/10/15 ETT ([facility]): Exercise ECG negative at 83% max predicted HR.   [name]  COLONOSCOPY  2003; 05/2012   diverticulosis, no polyps.  Recall 10 yrs  . colonoscopy with polypectomy     Dr Henrene Pastor  . DEXA  06/07/2016; 08/22/2018   2017 T-score -3.1.  2020 T score -3.3  . KIDNEY TRANSPLANT  13-Nov-2015   Deceased donor kidney transplant, with Thymoglobulin induction  . LIGATION OF ARTERIOVENOUS  FISTULA Right 02/14/2017   Procedure: LIGATION OF ARTERIOVENOUS  FISTULA;  Surgeon: Angelia Mould, MD;  Location: [place];  Service: Vascular;  Laterality: Right;  . NEPHRECTOMY  1993   for malignancy- left  . RENAL BIOPSY     right  . RESECTION OF ARTERIOVENOUS FISTULA ANEURYSM Right 02/14/2017   Procedure: EXCISION OF RIGHT BRACHIOCEPHALIC ARTERIOVENOUS FISTULA ANEURYSM;  Surgeon: Angelia Mould, MD;  Location: [place];  Service: Vascular;  Laterality: Right;  . TEE WITHOUT CARDIOVERSION N/A 08/27/2013   Procedure: TRANSESOPHAGEAL ECHOCARDIOGRAM (TEE);  Surgeon: [name], MD;  [facility] ENDOSCOPY;  Service: Cardiovascular;  Laterality: N/A;  . TOTAL ABDOMINAL HYSTERECTOMY W/ BILATERAL SALPINGOOPHORECTOMY  1997   fibroids  . TRANSTHORACIC ECHOCARDIOGRAM     03/10/15 echo ([facility]): LV cavity normal in size, focal basal hypertrophy, normal systolic function, EF 03% (visual est). Normal wall motion, no regional wall motion abnormalities. Mild diastolic dysfunction with normal LA chamber size. No significant valve stenosis or regurgitation.  . VULVA / PERINEUM BIOPSY  2015    OB History    Gravida  2   Para  2   Term  2   Preterm  0   AB  0   Living  2     SAB  0   TAB  0   Ectopic  0   Multiple  0   Live Births               Home Medications    Prior to Admission medications   Medication Sig Start Date End Date Taking? Authorizing Provider  acetaminophen (TYLENOL) 500 MG tablet Take 500-750 mg by mouth every 6 (six) hours as needed for pain.    [provider]  amoxicillin-clavulanate (AUGMENTIN) 875-125  MG tablet Take 1 tablet by mouth 2 (two) times daily. One po bid x 7 days 06/30/19   Noe Gens, PA-C  aspirin EC 81 MG EC tablet Take 1 tablet (81 mg total) by mouth daily. 12/09/12   Edmisten, Brooke O, PA-C  AYR SALINE NASAL DROPS NA Place 2 sprays into both nostrils daily.    [provider]  Camphor-Eucalyptus-Menthol (VICKS VAPORUB EX) Apply 1 application topically daily.    [provider]  doxazosin (CARDURA) 2 MG tablet Take 2 mg by mouth 2 (two) times daily.  07/19/16   [provider]  hydrochlorothiazide (HYDRODIURIL) 25 MG tablet Take 1 tablet (25 mg total) by mouth daily. 06/12/19   McGowen, Adrian Blackwater, MD  levothyroxine (SYNTHROID) 125 MCG tablet TAKE 1 TABLET BY MOUTH DAILY 04/03/19   McGowen, Adrian Blackwater, MD  LORazepam (ATIVAN) 0.5 MG tablet Take  1 tablet (0.5 mg total) by mouth 2 (two) times daily as needed for anxiety. 12/20/18   McGowen, Adrian Blackwater, MD  losartan (COZAAR) 100 MG tablet Take 1 tablet (100 mg total) by mouth daily. 01/10/19   McGowen, Adrian Blackwater, MD  Magnesium Oxide 500 MG TABS Take 1 tablet by mouth daily.    [provider]  meclizine (ANTIVERT) 25 MG tablet Take 1 tablet (25 mg total) by mouth 3 (three) times daily as needed for dizziness or nausea. 07/30/15   Noe Gens, PA-C  mycophenolate (MYFORTIC) 360 MG TBEC EC tablet Take 360 mg by mouth 2 (two) times daily.    [provider]  silver sulfADIAZINE (SILVADENE) 1 % cream Apply 1 application topically daily. Mixes with Triamcinolone cream    [provider]  SODIUM BICARBONATE PO Take 650 mg by mouth 2 (two) times daily. Takes 2 in AM and 2 at bedtime    [provider]  sulfamethoxazole-trimethoprim (BACTRIM) 400-80 MG tablet Take 1 tablet by mouth daily.    [provider]  tacrolimus (PROGRAF) 1 MG capsule Take 4 mg by mouth 2 (two) times daily. Pt takes 3 in the morning and 3 at night.    [provider]  triamcinolone (NASACORT  ALLERGY 24HR) 55 MCG/ACT AERO nasal inhaler Place 2 sprays into the nose daily.    [provider]  triamcinolone cream (KENALOG) 0.1 % Apply 1 application topically daily.    [provider]  verapamil (CALAN-SR) 120 MG CR tablet TAKE 1 TABLET BY MOUTH EVERY AFTERNOON 03/26/19   McGowen, Adrian Blackwater, MD  verapamil (CALAN-SR) 240 MG CR tablet 1 tab po every morning 11/12/18   McGowen, Adrian Blackwater, MD    Family History Family History  Problem Relation Age of Onset  . Deep vein thrombosis Mother        post thyroid surgery  . Hypertension Mother   . Heart attack Father 35       deceased  . Hypertension Father   . Cancer Sister        breast  . Cancer Paternal Aunt        pancreatic  . Diabetes Maternal Grandfather   . Stroke Paternal Grandmother        in 74s  . Fibroids Daughter   . Colon cancer Neg Hx   . Esophageal cancer Neg Hx   . Stomach cancer Neg Hx   . Rectal cancer Neg Hx     Social History Social History   Tobacco Use  . Smoking status: Never Smoker  . Smokeless tobacco: Never Used  Substance Use Topics  . Alcohol use: Yes    Comment: rarely  . Drug use: No     Allergies   Cefaclor, Naproxen, Enalapril maleate, and Metoprolol tartrate   Review of Systems Review of Systems  Constitutional: Negative for chills and fever.  Gastrointestinal: Negative for abdominal pain, diarrhea, nausea and vomiting.  Genitourinary: Positive for decreased urine volume and frequency. Negative for dysuria, pelvic pain and urgency.  Musculoskeletal: Negative for back pain and myalgias.     Physical Exam Triage Vital Signs ED Triage Vitals [06/30/19 1158]  Enc Vitals Group     BP 138/83     Pulse Rate 83     Resp 18     Temp 98.1 F (36.7 C)     Temp Source Oral     SpO2 96 %     Weight 244 lb 11.4 oz (111  kg)     Height      Head Circumference      Peak Flow      Pain Score 0     Pain Loc      Pain Edu?      Excl. in [place]?    No data found.   Updated Vital Signs BP 138/83 (BP Location: Left Arm)   Pulse 83   Temp 98.1 F (36.7 C) (Oral)   Resp 18   Wt 244 lb 11.4 oz (111 kg)   SpO2 96%   BMI 40.72 kg/m   Visual Acuity Right Eye Distance:   Left Eye Distance:   Bilateral Distance:    Right Eye Near:   Left Eye Near:    Bilateral Near:     Physical Exam Vitals signs and nursing note reviewed.  Constitutional:      Appearance: Normal appearance. She is well-developed.  HENT:     Head: Normocephalic and atraumatic.     Mouth/Throat:     Mouth: Mucous membranes are moist.  Neck:     Musculoskeletal: Normal range of motion.  Cardiovascular:     Rate and Rhythm: Normal rate and regular rhythm.  Pulmonary:     Effort: Pulmonary effort is normal. No respiratory distress.     Breath sounds: Normal breath sounds.  Abdominal:     General: There is no distension.     Palpations: Abdomen is soft.     Tenderness: There is no abdominal tenderness. There is no right CVA tenderness or left CVA tenderness.  Musculoskeletal: Normal range of motion.  Skin:    General: Skin is warm and dry.  Neurological:     Mental Status: She is alert and oriented to person, place, and time.  Psychiatric:        Behavior: Behavior normal.      UC Treatments / Results  Labs (all labs ordered are listed, but only abnormal results are displayed) Labs Reviewed  POCT URINALYSIS DIP (MANUAL ENTRY) - Abnormal; Notable for the following components:      Result Value   Blood, UA trace-lysed (*)    Leukocytes, UA Small (1+) (*)    All other components within normal limits  URINE CULTURE    EKG   Radiology No results found.  Procedures Procedures (including critical care time)  Medications Ordered in UC Medications - No data to display  Initial Impression / Assessment and Plan / UC Course  I have reviewed the triage vital signs and the nursing notes.  Pertinent labs & imaging results that were available during my care of the  patient were reviewed by me and considered in my medical decision making (see chart for details).     Hx and UA c/w UTI Culture sent Will start pt on Augmentin while culture pending. Pt reports allergy to cefaclor but states she has had amoxicillin w/o reaction.  Final Clinical Impressions(s) / UC Diagnoses   Final diagnoses:  Abnormal finding on urinalysis  Acute cystitis without hematuria     Discharge Instructions      Please take your antibiotic as prescribed. A urine culture has been sent to check the severity of your urinary infection and to determine if you are on the most appropriate antibiotic. The results should come back within 2-3 days and you will be notified even if no medication change is needed.  Please stay well hydrated and follow up with your family doctor in 2-3 days if not improving, sooner  if worsening.     ED Prescriptions    Medication Sig Dispense Auth. Provider   amoxicillin-clavulanate (AUGMENTIN) 875-125 MG tablet Take 1 tablet by mouth 2 (two) times daily. One po bid x 7 days 14 tablet Noe Gens, [place]     PDMP not reviewed this encounter.   Noe Gens, [place] 07/02/19 [phone]

## 2019-06-30 NOTE — ED Triage Notes (Signed)
Pt c/o urinary frequency that started this morning. Feels like she can't completely empty bladder.

## 2019-07-02 ENCOUNTER — Telehealth (HOSPITAL_COMMUNITY): Payer: Self-pay | Admitting: Emergency Medicine

## 2019-07-02 LAB — URINE CULTURE
MICRO NUMBER:: 1128726
SPECIMEN QUALITY:: ADEQUATE

## 2019-07-02 MED ORDER — CIPROFLOXACIN HCL 500 MG PO TABS: 500.0000 mg | ORAL_TABLET | Freq: Two times a day (BID) | ORAL | 0 refills | Status: AC

## 2019-07-02 NOTE — Telephone Encounter (Signed)
Reviewed chart with samantha murrill, pt needs to switch to cipro 500mg  BID x7 days. Patient contacted and made aware of    results. Pt verbalized understanding and had all questions answered.

## 2019-07-22 DIAGNOSIS — Z79899 Other long term (current) drug therapy: Secondary | ICD-10-CM | POA: Diagnosis not present

## 2019-07-22 DIAGNOSIS — Z940 Kidney transplant status: Secondary | ICD-10-CM | POA: Diagnosis not present

## 2019-08-20 ENCOUNTER — Encounter: Payer: Self-pay | Admitting: Family Medicine

## 2019-08-20 ENCOUNTER — Ambulatory Visit (INDEPENDENT_AMBULATORY_CARE_PROVIDER_SITE_OTHER): Payer: Medicare Other | Admitting: Family Medicine

## 2019-08-20 ENCOUNTER — Other Ambulatory Visit: Payer: Self-pay

## 2019-08-20 VITALS — BP 113/79 | HR 84

## 2019-08-20 DIAGNOSIS — F411 Generalized anxiety disorder: Secondary | ICD-10-CM

## 2019-08-20 DIAGNOSIS — I10 Essential (primary) hypertension: Secondary | ICD-10-CM | POA: Diagnosis not present

## 2019-08-20 DIAGNOSIS — Z940 Kidney transplant status: Secondary | ICD-10-CM

## 2019-08-20 DIAGNOSIS — N183 Chronic kidney disease, stage 3 unspecified: Secondary | ICD-10-CM

## 2019-08-20 DIAGNOSIS — N3000 Acute cystitis without hematuria: Secondary | ICD-10-CM

## 2019-08-20 DIAGNOSIS — J310 Chronic rhinitis: Secondary | ICD-10-CM

## 2019-08-20 NOTE — Progress Notes (Signed)
Virtual Visit via Video Note  I connected with Tabitha Green on 08/20/19 at  9:30 AM EST by a video enabled telemedicine application and verified that I am speaking with the correct person using two identifiers.  Location patient: home Location provider:work or home office Persons participating in the virtual visit: patient, provider  I discussed the limitations of evaluation and management by telemedicine and the availability of in person appointments. The patient expressed understanding and agreed to proceed.  Telemedicine visit is a necessity given the COVID-19 restrictions in place at the current time.  HPI: 74 y/o WF being seen for 3 mo f/u HTN, CRI, GAD. She has CRI III and hx of renal transplant. 6 mo ago I saw her for her CPE and all labs were stable.  A/P as of last visit: "1) HTN: The current medical regimen is effective;  continue present plan and medications. Labs coming up via nephrologist --tomorrow.  2) Hypoth: taking T4 correctly.  TSH 3 mo ago normal.  3) Vit B12 def: replaced adequately IM. Since switching to PO 2 mo ago she has not been very compliant--about 2 doses per week avg. B12 level check today.  4) Hx of very mildly low Magnesium: taking 500 mg -1000 mg qd. Check mag level today.  5) GAD: she is highly anxious but takes benzo very sporadically. Controlled substance contract reviewed with patient today.  Patient signed this and it will be placed in the chart.   No new rx for this med was needed today.  6) Low back pain w/out radiculopathy: she is followed by Sports Med MD, Dr. Dianah Field. I recommended she f/u with him b/c she feels like she has not improved with adequate trial of Tabitha Green."  Interim hx:  I'm doing ok.  "Chronic "sinus" sx's"---some pain under eyes some days, some days not, PND, slight cough, mild dec appetite. No mucous coming out nose.  Using saline nasal spray, otc nasacort, tylenol.  No antihistamine. No fevers, no cough, no SOB.  No ear  pain or stuffiness.  This has been going on for quite a while--months.  ED visit 06/30/19->UTI->augmentin->c/s showed resistance to this so she was switched to cipro. All urinary sx's resolved.  HTN: home bp monitoring regularly: consistently <130/80.  CRI: most recent nephrol f/u was BEFORE I last saw her. Has appt tomorrow, will be getting labs at that time.  GAD: she is her usual anxious self, stable.  No panic.  Mood stable.  ROS: no fever, no CP, no SOB, no wheezing, no cough, no dizziness, no rashes, no melena/hematochezia.  No polyuria or polydipsia.  No myalgias or arthralgias.   Past Medical History:  Diagnosis Date  . Anemia of chronic kidney failure    r/t kidney function- receives Procrit  . Anxiety   . Chronic renal insufficiency, stage III (moderate) 10/2015   Post-transplant Cr 1.3, GFR 41 as of June 2017 f/u w/ [facility]  . CMV infection [facility]) summer 2017   Valcyte per ID/Renal transplant team  . Diverticulosis    a. 05/2012 colonoscopy  . Erosive lichen planus of vulva    Topical steroids (managed by Dr. Mila Palmer [facility].  . ESRD (end stage renal disease) ([place])    began dialysis 2014--followed by Dr. [name].  Received deceased donor kidney transplant 10/2015.  [name] FSGS (focal segmental glomerulosclerosis)    right kidney; hx of left renal cell cancer and got nephrectomy 1993.  [name] GERD (gastroesophageal reflux disease)   .  Gout   . Heart murmur    (Diastolic) ECHO 12/6387 showed that this murmur is coming from Tabitha Green's R arm AV fistula  . Hiatal hernia   . History of renal cell cancer 1993   Left nephrectomy  . Hyperlipidemia   . Hypertension   . Hypothyroidism   . Impingement syndrome of left shoulder 04/2016   Dr. Christy Sartorius to Tabitha Green  . Lumbar spinal stenosis    Chronic LBP with bilat neurogenic claudication  . Metatarsal fracture, pathologic 01/2018   Right; orthocarolina-->post op shoe  continued, f/u x-ray planned.  . Osteoarthritis of left knee 08/2017   severe, diffuse, tricompartmental.  Responded to steroid injection.    . Osteoporosis 06/07/2016   DEXA T-score of -3.1.  Fosamax planned 2017 but dental work prevented start..  Pathologic toe fx 12/2017--repeat DEXA 08/2018, T-score -3.3 radius.  . Renal transplant recipient 2015/11/26   Baseline Cr as of 02/2018= 1.0-1.2.  . SVT (supraventricular tachycardia) ([place])   . Vitamin B12 deficiency 12/2018   Starting replacement as of 12/11/2018    Past Surgical History:  Procedure Laterality Date  . AV FISTULA PLACEMENT Right    Right arm: aneurismal dilatation 2017 being followed by CV surgeons  . CARDIOVASCULAR STRESS TEST     03/10/15 ETT ([facility]): Exercise ECG negative at 83% max predicted HR.   [name] COLONOSCOPY  2003; 05/2012   diverticulosis, no polyps.  Recall 10 yrs  . colonoscopy with polypectomy     Dr Henrene Pastor  . DEXA  06/07/2016; 08/22/2018   2017 T-score -3.1.  2020 T score -3.3  . KIDNEY TRANSPLANT  November 26, 2015   Deceased donor kidney transplant, with Thymoglobulin induction  . LIGATION OF ARTERIOVENOUS  FISTULA Right 02/14/2017   Procedure: LIGATION OF ARTERIOVENOUS  FISTULA;  Surgeon: Angelia Mould, MD;  Location: [place];  Service: Vascular;  Laterality: Right;  . NEPHRECTOMY  1993   for malignancy- left  . RENAL BIOPSY     right  . RESECTION OF ARTERIOVENOUS FISTULA ANEURYSM Right 02/14/2017   Procedure: EXCISION OF RIGHT BRACHIOCEPHALIC ARTERIOVENOUS FISTULA ANEURYSM;  Surgeon: Angelia Mould, MD;  Location: [place];  Service: Vascular;  Laterality: Right;  . TEE WITHOUT CARDIOVERSION N/A 08/27/2013   Procedure: TRANSESOPHAGEAL ECHOCARDIOGRAM (TEE);  Surgeon: [name], MD;  Location: [facility] ENDOSCOPY;  Service: Cardiovascular;  Laterality: N/A;  . TOTAL ABDOMINAL HYSTERECTOMY W/ BILATERAL SALPINGOOPHORECTOMY  1997   fibroids  . TRANSTHORACIC ECHOCARDIOGRAM     03/10/15 echo ([facility]): LV cavity normal in size, focal basal hypertrophy, normal systolic function, EF 37% (visual est). Normal wall motion, no regional wall motion abnormalities. Mild diastolic dysfunction with normal LA chamber size. No significant valve stenosis or regurgitation.  . VULVA / PERINEUM BIOPSY  2015    Family History  Problem Relation Age of Onset  . Deep vein thrombosis Mother        post thyroid surgery  . Hypertension Mother   . Heart attack Father 13       deceased  . Hypertension Father   . Cancer Sister        breast  . Cancer Paternal Aunt        pancreatic  . Diabetes Maternal Grandfather   . Stroke Paternal Grandmother        in 74s  . Fibroids Daughter   . Colon cancer Neg Hx   . Esophageal cancer Neg Hx   . Stomach cancer Neg Hx   .  Rectal cancer Neg Hx     Social History   Socioeconomic History  . Marital status: Married    Spouse name: Not on file  . Number of children: 2  . Years of education: Not on file  . Highest education level: Not on file  Occupational History  . Occupation: Retired  Tobacco Use  . Smoking status: Never Smoker  . Smokeless tobacco: Never Used  Substance and Sexual Activity  . Alcohol use: Yes    Comment: rarely  . Drug use: No  . Sexual activity: Not Currently    Birth control/protection: Surgical  Other Topics Concern  . Not on file  Social History Narrative   Lives in [place] with husband.  Retired.  Previously worked in [employer] @ [employer]   No T/A/Ds.   Social Determinants of Health   Financial Resource Strain:   . Difficulty of Paying Living Expenses: Not on file  Food Insecurity:   . Worried About Charity fundraiser in the Last Year: Not on file  . Ran Out of Food in the Last Year: Not on file  Transportation Needs:   . Lack of Transportation (Medical): Not on file  . Lack of Transportation (Non-Medical): Not on file  Physical Activity:   . Days of Exercise per Week: Not on file  . Minutes of  Exercise per Session: Not on file  Stress:   . Feeling of Stress : Not on file  Social Connections:   . Frequency of Communication with Friends and Family: Not on file  . Frequency of Social Gatherings with Friends and Family: Not on file  . Attends Religious Services: Not on file  . Active Member of Clubs or Organizations: Not on file  . Attends Archivist Meetings: Not on file  . Marital Status: Not on file      Current Outpatient Medications:  .  acetaminophen (TYLENOL) 500 MG tablet, Take 500-750 mg by mouth every 6 (six) hours as needed for pain., Disp: , Rfl:  .  aspirin EC 81 MG EC tablet, Take 1 tablet (81 mg total) by mouth daily., Disp: , Rfl:  .  AYR SALINE NASAL DROPS NA, Place 2 sprays into both nostrils daily., Disp: , Rfl:  .  Camphor-Eucalyptus-Menthol (VICKS VAPORUB EX), Apply 1 application topically daily., Disp: , Rfl:  .  doxazosin (CARDURA) 2 MG tablet, Take 2 mg by mouth 2 (two) times daily. , Disp: , Rfl:  .  hydrochlorothiazide (HYDRODIURIL) 25 MG tablet, Take 1 tablet (25 mg total) by mouth daily., Disp: 90 tablet, Rfl: 1 .  levothyroxine (SYNTHROID) 125 MCG tablet, TAKE 1 TABLET BY MOUTH DAILY, Disp: 90 tablet, Rfl: 1 .  LORazepam (ATIVAN) 0.5 MG tablet, Take 1 tablet (0.5 mg total) by mouth 2 (two) times daily as needed for anxiety., Disp: 30 tablet, Rfl: 1 .  losartan (COZAAR) 100 MG tablet, Take 1 tablet (100 mg total) by mouth daily., Disp: 90 tablet, Rfl: 3 .  Magnesium Oxide 500 MG TABS, Take 1 tablet by mouth daily., Disp: , Rfl:  .  mycophenolate (MYFORTIC) 360 MG TBEC EC tablet, Take 360 mg by mouth 2 (two) times daily., Disp: , Rfl:  .  silver sulfADIAZINE (SILVADENE) 1 % cream, Apply 1 application topically daily. Mixes with Triamcinolone cream, Disp: , Rfl:  .  SODIUM BICARBONATE PO, Take 650 mg by mouth 2 (two) times daily. Takes 2 in AM and 2 at bedtime, Disp: , Rfl:  .  sulfamethoxazole-trimethoprim (  BACTRIM) 400-80 MG tablet, Take 1  tablet by mouth daily., Disp: , Rfl:  .  tacrolimus (PROGRAF) 1 MG capsule, Take 4 mg by mouth 2 (two) times daily. Tabitha Green takes 3 in the morning and 3 at night., Disp: , Rfl:  .  triamcinolone (NASACORT ALLERGY 24HR) 55 MCG/ACT AERO nasal inhaler, Place 2 sprays into the nose daily., Disp: , Rfl:  .  triamcinolone cream (KENALOG) 0.1 %, Apply 1 application topically daily., Disp: , Rfl:  .  verapamil (CALAN-SR) 120 MG CR tablet, TAKE 1 TABLET BY MOUTH EVERY AFTERNOON, Disp: 90 tablet, Rfl: 1 .  verapamil (CALAN-SR) 240 MG CR tablet, 1 tab po every morning, Disp: 90 tablet, Rfl: 1 .  meclizine (ANTIVERT) 25 MG tablet, Take 1 tablet (25 mg total) by mouth 3 (three) times daily as needed for dizziness or nausea. (Patient not taking: Reported on 08/20/2019), Disp: 20 tablet, Rfl: 0  EXAM:  VITALS per patient if applicable: BP 740/81 (BP Location: Left Arm, Patient Position: Sitting, Cuff Size: Large)   Pulse 84    GENERAL: alert, oriented, appears well and in no acute distress  HEENT: atraumatic, conjunttiva clear, no obvious abnormalities on inspection of external nose and ears  NECK: normal movements of the head and neck  LUNGS: on inspection no signs of respiratory distress, breathing rate appears normal, no obvious gross SOB, gasping or wheezing  CV: no obvious cyanosis  MS: moves all visible extremities without noticeable abnormality  PSYCH/NEURO: pleasant and cooperative, no obvious depression or anxiety, speech and thought processing grossly intact  LABS: none today    Chemistry      Component Value Date/Time   NA 140 03/28/2019 0952   NA 139 02/13/2019 0000   K 4.0 03/28/2019 0952   CL 103 03/28/2019 0952   CO2 27 03/28/2019 0952   BUN 23 03/28/2019 0952   BUN 21 02/13/2019 0000   CREATININE 1.22 (H) 03/28/2019 0952   CREATININE 3.61 (H) 09/16/2015 1515      Component Value Date/Time   CALCIUM 9.5 03/28/2019 0952   CALCIUM 8.4 08/30/2012 0819   ALKPHOS 77 02/18/2019 0948    AST 13 02/18/2019 0948   ALT 13 02/18/2019 0948   BILITOT 0.6 02/18/2019 0948     Lab Results  Component Value Date   WBC 6.3 02/18/2019   HGB 13.4 02/18/2019   HCT 40.5 02/18/2019   MCV 92.8 02/18/2019   PLT 247.0 02/18/2019   Lab Results  Component Value Date   CHOL 167 02/18/2019   HDL 44.90 02/18/2019   LDLCALC 98 02/18/2019   LDLDIRECT 166.3 10/02/2007   TRIG 124.0 02/18/2019   CHOLHDL 4 02/18/2019   Lab Results  Component Value Date   TSH 0.94 02/18/2019   Lab Results  Component Value Date   HGBA1C 5.4 02/23/2012   Lab Results  Component Value Date   VITAMINB12 701 05/21/2019    ASSESSMENT AND PLAN:  Discussed the following assessment and plan:  1) HTN: The current medical regimen is effective;  continue present plan and medications. Await labs to be done by nephrology soon.  2) CRI 3, hx of renal transplant: has nephrology f/u tomorrow. No med changes.  3) GAD: The current medical regimen is effective;  continue present plan and medications.  4) chronic rhinitis: no sign of bacterial infection. Recommend continue saline nasal spray, nasacort, and add otc nonsedating antihistamine.  5) UTI 06/2019->all sx's resolved s/p cipro.  Will e-mail Tabitha Green covid vaccine info. Depending on  last neph f/u labs, no labs may be needed at this time.   I discussed the assessment and treatment plan with the patient. The patient was provided an opportunity to ask questions and all were answered. The patient agreed with the plan and demonstrated an understanding of the instructions.   The patient was advised to call back or seek an in-person evaluation if the symptoms worsen or if the condition fails to improve as anticipated.  F/u: 3 mo RCI  Signed:  Crissie Sickles, MD           08/20/2019

## 2019-08-21 DIAGNOSIS — Z940 Kidney transplant status: Secondary | ICD-10-CM | POA: Diagnosis not present

## 2019-08-21 DIAGNOSIS — I10 Essential (primary) hypertension: Secondary | ICD-10-CM | POA: Diagnosis not present

## 2019-08-21 DIAGNOSIS — N2581 Secondary hyperparathyroidism of renal origin: Secondary | ICD-10-CM | POA: Diagnosis not present

## 2019-08-21 DIAGNOSIS — N051 Unspecified nephritic syndrome with focal and segmental glomerular lesions: Secondary | ICD-10-CM | POA: Diagnosis not present

## 2019-08-21 DIAGNOSIS — N390 Urinary tract infection, site not specified: Secondary | ICD-10-CM | POA: Diagnosis not present

## 2019-09-25 ENCOUNTER — Other Ambulatory Visit: Payer: Self-pay

## 2019-09-25 MED ORDER — LORAZEPAM 0.5 MG PO TABS: 0.5000 mg | ORAL_TABLET | Freq: Two times a day (BID) | ORAL | 1 refills | Status: DC | PRN

## 2019-09-25 MED ORDER — LEVOTHYROXINE SODIUM 125 MCG PO TABS: 125.0000 ug | ORAL_TABLET | Freq: Every day | ORAL | 0 refills | Status: DC

## 2019-09-25 NOTE — Telephone Encounter (Signed)
Requesting: Lorazepam Contract:05/21/19 UDS:n/a Last Visit:08/20/19 Next Visit: advised to f/u 3 mo. Last Refill:12/20/18(30,1)  Please Advise. Medication pending

## 2019-09-27 ENCOUNTER — Other Ambulatory Visit: Payer: Self-pay

## 2019-09-27 MED ORDER — HYDROCHLOROTHIAZIDE 25 MG PO TABS: 25.0000 mg | ORAL_TABLET | Freq: Every day | ORAL | 0 refills | Status: DC

## 2019-09-27 MED ORDER — LEVOTHYROXINE SODIUM 125 MCG PO TABS: 125.0000 ug | ORAL_TABLET | Freq: Every day | ORAL | 0 refills | Status: DC

## 2019-09-27 MED ORDER — DOXAZOSIN MESYLATE 2 MG PO TABS: 2.0000 mg | ORAL_TABLET | Freq: Two times a day (BID) | ORAL | 3 refills | Status: DC

## 2019-09-27 MED ORDER — VERAPAMIL HCL ER 120 MG PO TBCR: EXTENDED_RELEASE_TABLET | ORAL | 0 refills | Status: DC

## 2019-09-27 MED ORDER — VERAPAMIL HCL ER 240 MG PO TBCR: EXTENDED_RELEASE_TABLET | ORAL | 0 refills | Status: DC

## 2019-09-27 NOTE — Telephone Encounter (Signed)
RF request for Doxazosin LOV:08/20/19 Next ov: advised to f/u 3 mo. Last written:07/19/16, not originally prescribed by you.   Please advise, thanks. Medication pending Using mail order pharmacy. Pharmacy updated

## 2019-10-01 ENCOUNTER — Other Ambulatory Visit: Payer: Self-pay

## 2019-10-01 MED ORDER — LOSARTAN POTASSIUM 100 MG PO TABS: 100.0000 mg | ORAL_TABLET | Freq: Every day | ORAL | 0 refills | Status: DC

## 2019-10-10 ENCOUNTER — Other Ambulatory Visit: Payer: Self-pay

## 2019-10-10 MED ORDER — DOXAZOSIN MESYLATE 2 MG PO TABS: 2.0000 mg | ORAL_TABLET | Freq: Two times a day (BID) | ORAL | 2 refills | Status: DC

## 2019-10-16 ENCOUNTER — Other Ambulatory Visit: Payer: Self-pay | Admitting: Family Medicine

## 2019-10-16 DIAGNOSIS — Z1231 Encounter for screening mammogram for malignant neoplasm of breast: Secondary | ICD-10-CM

## 2019-10-17 ENCOUNTER — Other Ambulatory Visit: Payer: Self-pay

## 2019-10-17 ENCOUNTER — Ambulatory Visit (INDEPENDENT_AMBULATORY_CARE_PROVIDER_SITE_OTHER): Payer: Medicare Other

## 2019-10-17 DIAGNOSIS — Z1231 Encounter for screening mammogram for malignant neoplasm of breast: Secondary | ICD-10-CM | POA: Diagnosis not present

## 2019-10-17 IMAGING — MG DIGITAL SCREENING BILAT W/ TOMO W/ CAD
8 of 17 series · 8 of 40 positions shown · non-contrast
Comparison: Previous exam(s).

CLINICAL DATA: Screening.

EXAM:
DIGITAL SCREENING BILATERAL MAMMOGRAM WITH TOMO AND CAD

[L CC synth-2D]
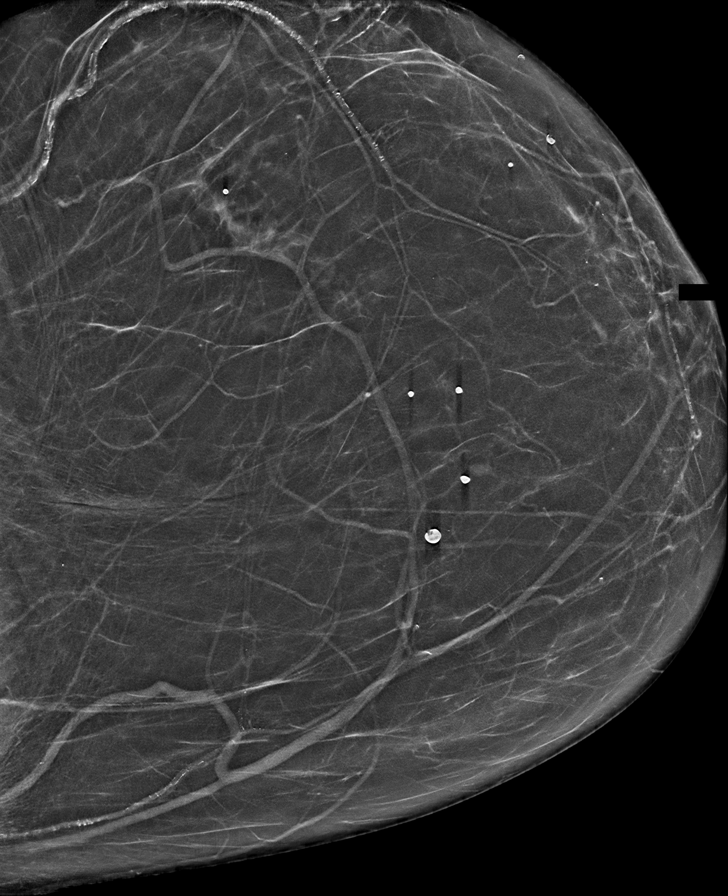

[R MLO synth-2D (1 of 2)]
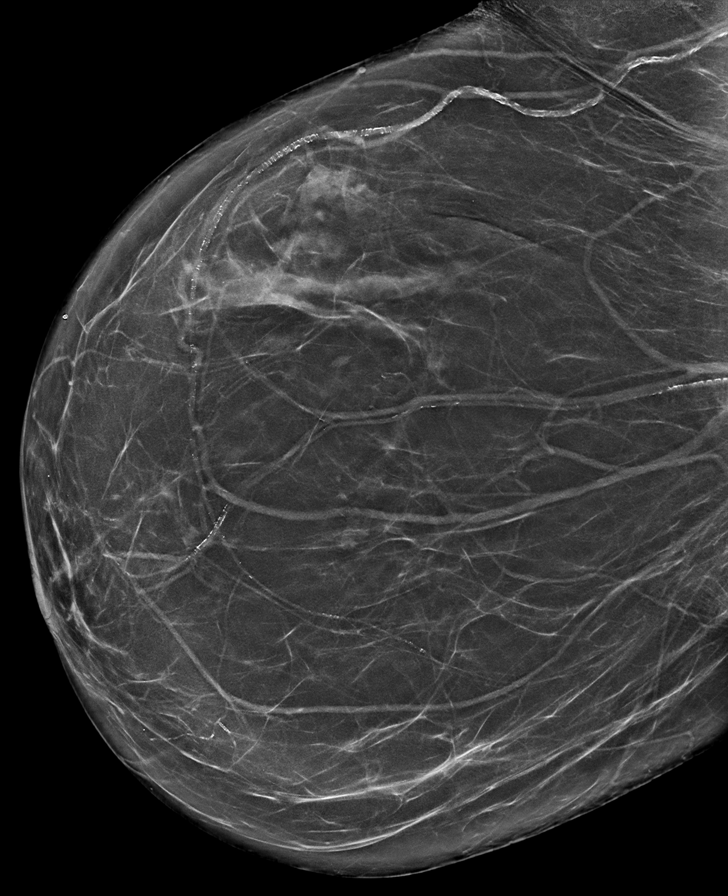

[R XCCM synth-2D]
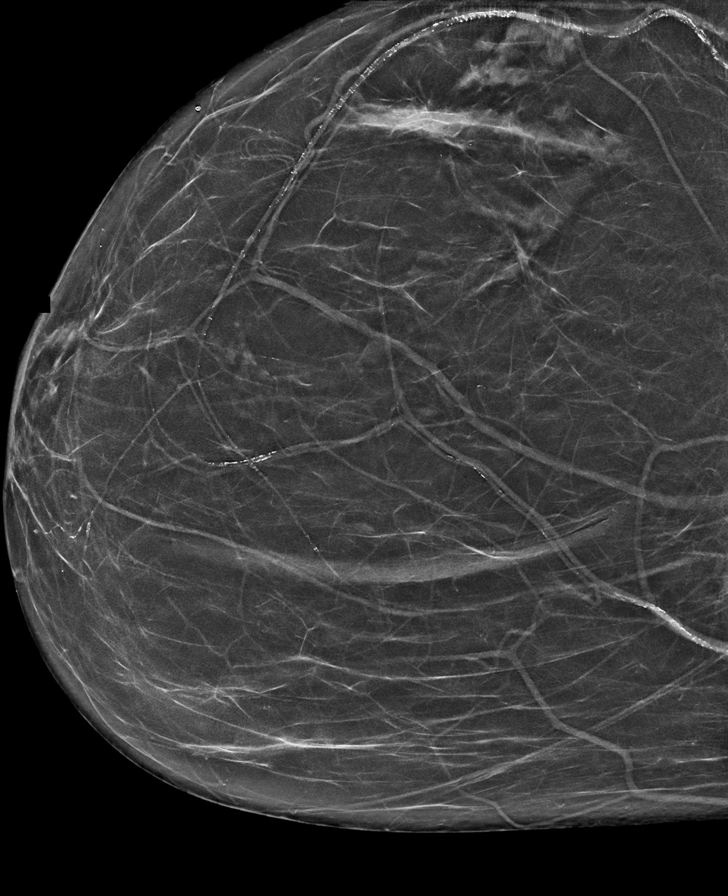

[L MLO synth-2D]
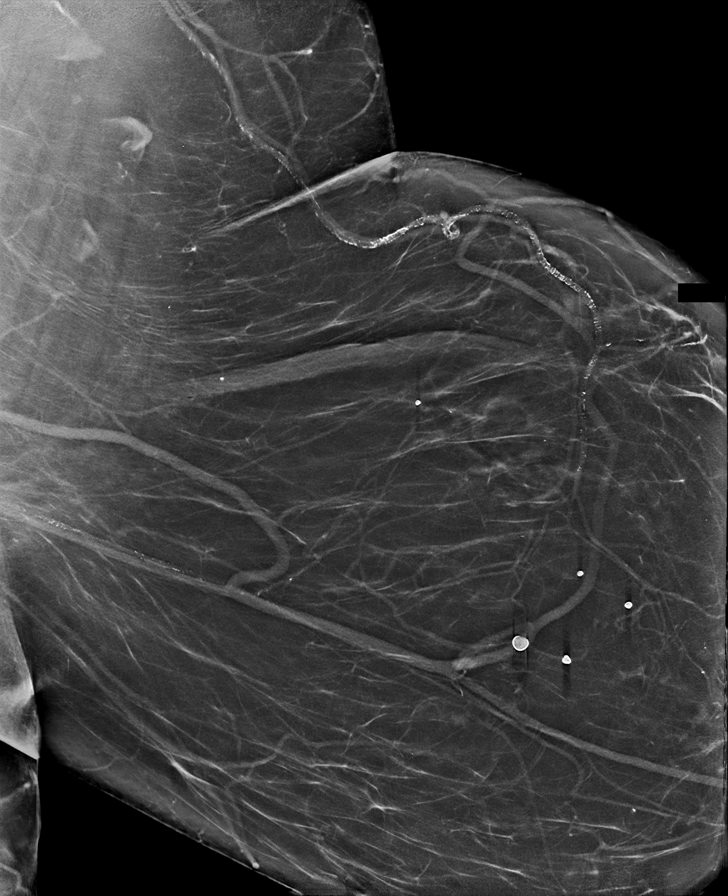

[R CC synth-2D (1 of 2)]
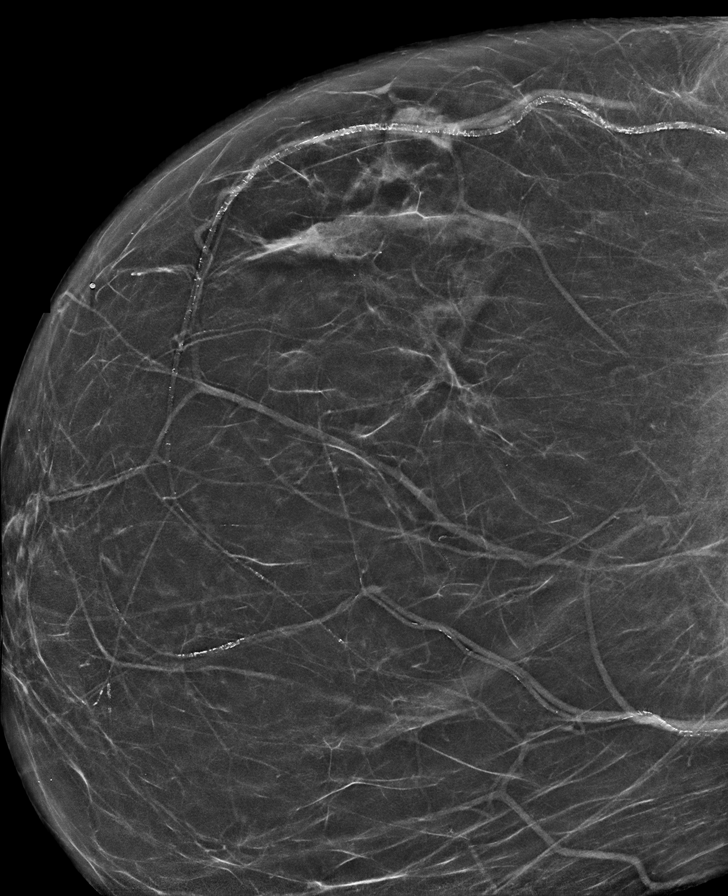

[R CC synth-2D (2 of 2)]
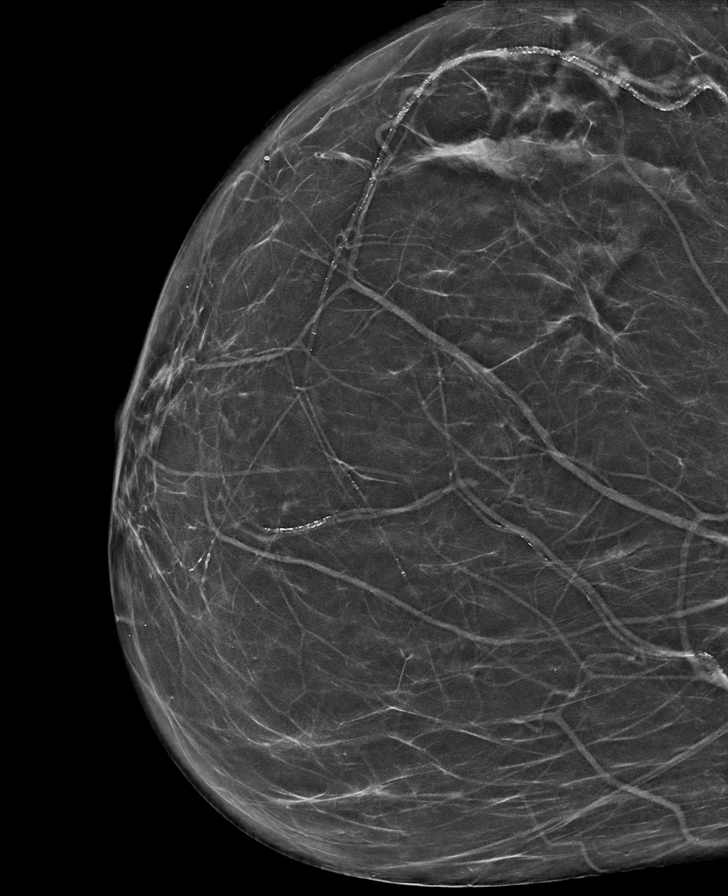

[R MLO synth-2D (2 of 2)]
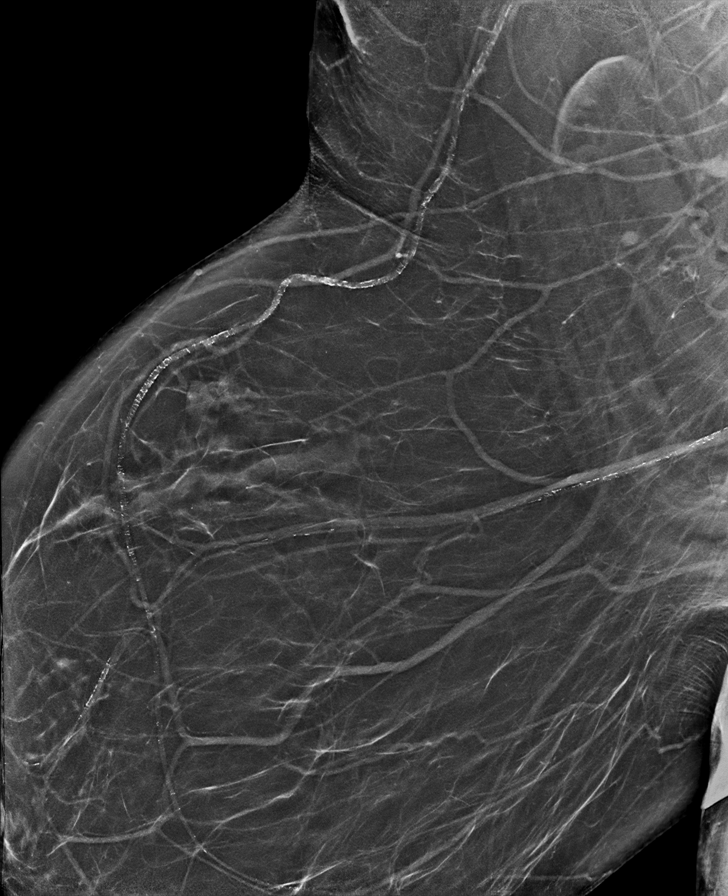

[L CC tomo · tomo slice 35/69.0]
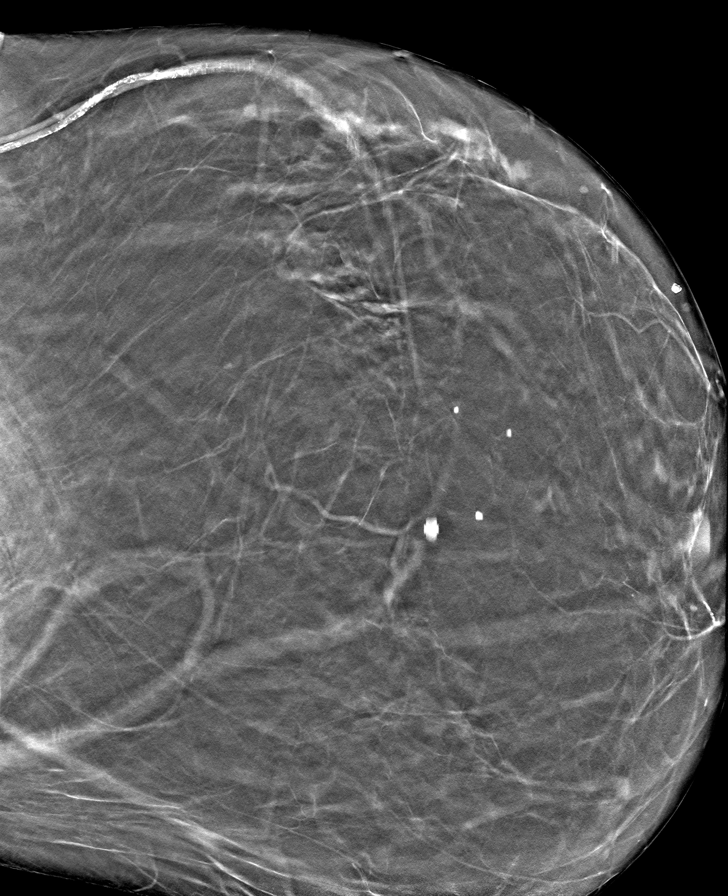

[8 of 40 positions shown; findings below may reference images not displayed]

ACR Breast Density Category b: There are scattered areas of
fibroglandular density.
FINDINGS: There are no findings suspicious for malignancy. Images were
processed with CAD.
IMPRESSION: No mammographic evidence of malignancy. A result letter of this
screening mammogram will be mailed directly to the patient.

RECOMMENDATION:
Screening mammogram in one year. (Code:[TQ])

BI-RADS CATEGORY  1: Negative.

## 2019-11-04 ENCOUNTER — Other Ambulatory Visit: Payer: Self-pay

## 2019-11-05 ENCOUNTER — Other Ambulatory Visit: Payer: Self-pay

## 2019-11-05 ENCOUNTER — Encounter: Payer: Self-pay | Admitting: Family Medicine

## 2019-11-05 ENCOUNTER — Ambulatory Visit (INDEPENDENT_AMBULATORY_CARE_PROVIDER_SITE_OTHER): Payer: Medicare Other | Admitting: Family Medicine

## 2019-11-05 VITALS — BP 120/79 | HR 73 | Temp 97.9°F | Resp 16 | Ht 65.0 in | Wt 243.4 lb

## 2019-11-05 DIAGNOSIS — R202 Paresthesia of skin: Secondary | ICD-10-CM | POA: Diagnosis not present

## 2019-11-05 NOTE — Progress Notes (Signed)
OFFICE VISIT  11/05/2019   CC:  Chief Complaint  Patient presents with  . Stinging feeling in body    x 2 weeks, believes possible inflammation throughou entire body    HPI:    Patient is a 74 y.o. Caucasian female who presents for "stinging feeling in body". Two weeks of feeling of "stingy" sensation-says in arms and legs.  Has hard time describing the actual sensation.  Only during daytime.  No numbness. No restless feeling in arms or legs.  "Like something pulling".  No pain.  NO weakness. No new meds or new foods.  Routine same as usual. No malaise or fatigue.  ROS: no fevers, no CP, no SOB, no wheezing, no cough, no dizziness, no HAs, no rashes, no melena/hematochezia.  No polyuria or polydipsia.  No myalgias or arthralgias.  No focal weakness or tremors.  No acute vision or hearing abnormalities. No n/v/d or abd pain.  No palpitations.     Past Medical History:  Diagnosis Date  . Anemia of chronic kidney failure    r/t kidney function- receives Procrit  . Anxiety   . Chronic renal insufficiency, stage III (moderate) 10/2015   Post-transplant Cr 1.3, GFR 41 as of June 2017 f/u w/ [facility]  . CMV infection [facility]) summer 2017   Valcyte per ID/Renal transplant team  . Diverticulosis    a. 05/2012 colonoscopy  . Erosive lichen planus of vulva    Topical steroids (managed by Dr. Mila Palmer [facility].  . ESRD (end stage renal disease) ([place])    began dialysis 2014--followed by Dr. [name].  Received deceased donor kidney transplant 10/2015.  [name] FSGS (focal segmental glomerulosclerosis)    right kidney; hx of left renal cell cancer and got nephrectomy 1993.  [name] GERD (gastroesophageal reflux disease)   . Gout   . Heart murmur    (Diastolic) ECHO 02/4943 showed that this murmur is coming from pt's R arm AV fistula  . Hiatal hernia   . History of renal cell cancer 1993   Left nephrectomy  . Hyperlipidemia   .  Hypertension   . Hypothyroidism   . Impingement syndrome of left shoulder 04/2016   Dr. Christy Sartorius to PT  . Lumbar spinal stenosis    Chronic LBP with bilat neurogenic claudication  . Metatarsal fracture, pathologic 01/2018   Right; orthocarolina-->post op shoe continued, f/u x-ray planned.  . Osteoarthritis of left knee 08/2017   severe, diffuse, tricompartmental.  Responded to steroid injection.    . Osteoporosis 06/07/2016   DEXA T-score of -3.1.  Fosamax planned 2017 but dental work prevented start..  Pathologic toe fx 12/2017--repeat DEXA 08/2018, T-score -3.3 radius.  . Renal transplant recipient 12/04/15   Baseline Cr as of 02/2018= 1.0-1.2.  . SVT (supraventricular tachycardia) ([place])   . Vitamin B12 deficiency 12/2018   Starting replacement as of 12/11/2018    Past Surgical History:  Procedure Laterality Date  . AV FISTULA PLACEMENT Right    Right arm: aneurismal dilatation 2017 being followed by CV surgeons  . CARDIOVASCULAR STRESS TEST     03/10/15 ETT ([facility]): Exercise ECG negative at 83% max predicted HR.   [name] COLONOSCOPY  2003; 05/2012   diverticulosis, no polyps.  Recall 10 yrs  . colonoscopy with polypectomy     Dr Henrene Pastor  . DEXA  06/07/2016; 08/22/2018   2017 T-score -3.1.  2020 T score -3.3  . KIDNEY TRANSPLANT  2015-12-04   Deceased donor  kidney transplant, with Thymoglobulin induction  . LIGATION OF ARTERIOVENOUS  FISTULA Right 02/14/2017   Procedure: LIGATION OF ARTERIOVENOUS  FISTULA;  Surgeon: Angelia Mould, MD;  Location: [place];  Service: Vascular;  Laterality: Right;  . NEPHRECTOMY  1993   for malignancy- left  . RENAL BIOPSY     right  . RESECTION OF ARTERIOVENOUS FISTULA ANEURYSM Right 02/14/2017   Procedure: EXCISION OF RIGHT BRACHIOCEPHALIC ARTERIOVENOUS FISTULA ANEURYSM;  Surgeon: Angelia Mould, MD;  Location: [place];  Service: Vascular;  Laterality: Right;  . TEE WITHOUT CARDIOVERSION N/A 08/27/2013   Procedure: TRANSESOPHAGEAL  ECHOCARDIOGRAM (TEE);  Surgeon: [name], MD;  Location: [facility] ENDOSCOPY;  Service: Cardiovascular;  Laterality: N/A;  . TOTAL ABDOMINAL HYSTERECTOMY W/ BILATERAL SALPINGOOPHORECTOMY  1997   fibroids  . TRANSTHORACIC ECHOCARDIOGRAM     03/10/15 echo ([facility]): LV cavity normal in size, focal basal hypertrophy, normal systolic function, EF 54% (visual est). Normal wall motion, no regional wall motion abnormalities. Mild diastolic dysfunction with normal LA chamber size. No significant valve stenosis or regurgitation.  . VULVA / PERINEUM BIOPSY  2015    Outpatient Medications Prior to Visit  Medication Sig Dispense Refill  . acetaminophen (TYLENOL) 500 MG tablet Take 500-750 mg by mouth every 6 (six) hours as needed for pain.    [name] aspirin EC 81 MG EC tablet Take 1 tablet (81 mg total) by mouth daily.    Skipper Cliche SALINE NASAL DROPS NA Place 2 sprays into both nostrils daily.    . Camphor-Eucalyptus-Menthol (VICKS VAPORUB EX) Apply 1 application topically daily.    . Cyanocobalamin (B-12 PO) Take by mouth daily.    [name] doxazosin (CARDURA) 2 MG tablet Take 1 tablet (2 mg total) by mouth 2 (two) times daily. 180 tablet 2  . hydrochlorothiazide (HYDRODIURIL) 25 MG tablet Take 1 tablet (25 mg total) by mouth daily. 90 tablet 0  . levothyroxine (SYNTHROID) 125 MCG tablet Take 1 tablet (125 mcg total) by mouth daily. 90 tablet 0  . LORazepam (ATIVAN) 0.5 MG tablet Take 1 tablet (0.5 mg total) by mouth 2 (two) times daily as needed for anxiety. 30 tablet 1  . losartan (COZAAR) 100 MG tablet Take 1 tablet (100 mg total) by mouth daily. 90 tablet 0  . Magnesium Oxide 500 MG TABS Take 1 tablet by mouth daily.    . mycophenolate (MYFORTIC) 360 MG TBEC EC tablet Take 360 mg by mouth 2 (two) times daily.    . SODIUM BICARBONATE PO Take 650 mg by mouth 2 (two) times daily. Takes 1 in AM and 1 at bedtime    . sulfamethoxazole-trimethoprim (BACTRIM) 400-80 MG tablet Take 1 tablet by mouth  daily.    . tacrolimus (PROGRAF) 1 MG capsule Take 4 mg by mouth 2 (two) times daily. Pt takes 3 in the morning and 3 at night.    . triamcinolone cream (KENALOG) 0.1 % Apply 1 application topically daily.    . verapamil (CALAN-SR) 120 MG CR tablet TAKE 1 TABLET BY MOUTH EVERY AFTERNOON 90 tablet 0  . verapamil (CALAN-SR) 240 MG CR tablet 1 tab po every morning 90 tablet 0  . meclizine (ANTIVERT) 25 MG tablet Take 1 tablet (25 mg total) by mouth 3 (three) times daily as needed for dizziness or nausea. (Patient not taking: Reported on 08/20/2019) 20 tablet 0  . silver sulfADIAZINE (SILVADENE) 1 % cream Apply 1 application topically daily. Mixes with Triamcinolone cream    .  triamcinolone (NASACORT ALLERGY 24HR) 55 MCG/ACT AERO nasal inhaler Place 2 sprays into the nose daily.    [name] triamcinolone ointment (KENALOG) 0.1 %      No facility-administered medications prior to visit.    Allergies  Allergen Reactions  . Cefaclor Rash  . Naproxen Rash  . Enalapril Maleate Cough    Vasotec  . Metoprolol Tartrate Rash    On legs    ROS As per HPI  PE: Blood pressure 120/79, pulse 73, temperature 97.9 F (36.6 C), temperature source Temporal, resp. rate 16, height 5\' 5"  (1.651 m), weight 243 lb 6.4 oz (110.4 kg), SpO2 97 %. Gen: Alert, well appearing.  Patient is oriented to person, place, time, and situation. AFFECT: pleasant, lucid thought and speech. YFV:CBSW: no injection, icteris, swelling, or exudate.  EOMI, PERRLA. Mouth: lips without lesion/swelling.  Oral mucosa pink and moist. Oropharynx without erythema, exudate, or swelling.  Neck:carotids 1+ bilat CV: RRR, no m/r/g.   LUNGS: CTA bilat, nonlabored resps, good aeration in all lung fields. EXT: no clubbing or cyanosis.  no edema.  Hands w/out any color abnormalities. Neuro: CN 2-12 intact bilaterally, strength 5/5 in proximal and distal upper extremities and lower extremities bilaterally.  No sensory deficits.  No tremor.  No  disdiadochokinesis.  No ataxia.  Upper extremity and lower extremity DTRs symmetric.  No pronator drift.    LABS:   Lab Results  Component Value Date   VITAMINB12 701 05/21/2019      Chemistry      Component Value Date/Time   NA 140 03/28/2019 0952   NA 139 02/13/2019 0000   K 4.0 03/28/2019 0952   CL 103 03/28/2019 0952   CO2 27 03/28/2019 0952   BUN 23 03/28/2019 0952   BUN 21 02/13/2019 0000   CREATININE 1.22 (H) 03/28/2019 0952   CREATININE 3.61 (H) 09/16/2015 1515      Component Value Date/Time   CALCIUM 9.5 03/28/2019 0952   CALCIUM 8.4 08/30/2012 0819   ALKPHOS 77 02/18/2019 0948   AST 13 02/18/2019 0948   ALT 13 02/18/2019 0948   BILITOT 0.6 02/18/2019 0948     Lab Results  Component Value Date   WBC 6.3 02/18/2019   HGB 13.4 02/18/2019   HCT 40.5 02/18/2019   MCV 92.8 02/18/2019   PLT 247.0 02/18/2019   Lab Results  Component Value Date   TSH 0.94 02/18/2019   IMPRESSION AND PLAN:  Paresthesias, unknown etiology. No red flags for distinct/ominous diagnoses. Reassured, no w/u at this time-->obs approach. Signs/symptoms to call or return for were reviewed and pt expressed understanding.  An After Visit Summary was printed and given to the patient.  FOLLOW UP: Return if symptoms worsen or fail to improve.  Signed:  Crissie Sickles, MD           11/05/2019

## 2019-11-18 DIAGNOSIS — I129 Hypertensive chronic kidney disease with stage 1 through stage 4 chronic kidney disease, or unspecified chronic kidney disease: Secondary | ICD-10-CM | POA: Diagnosis not present

## 2019-11-18 DIAGNOSIS — Z940 Kidney transplant status: Secondary | ICD-10-CM | POA: Diagnosis not present

## 2019-11-18 DIAGNOSIS — N390 Urinary tract infection, site not specified: Secondary | ICD-10-CM | POA: Diagnosis not present

## 2019-11-18 LAB — LIPID PANEL
Cholesterol: 152 (ref 0–200)
HDL: 41 (ref 35–70)
LDL Cholesterol: 90
Triglycerides: 117 (ref 40–160)

## 2019-11-18 LAB — BASIC METABOLIC PANEL
BUN: 20 (ref 4–21)
CO2: 21 (ref 13–22)
Chloride: 105 (ref 99–108)
Creatinine: 1.1 (ref 0.5–1.1)
Glucose: 103
Potassium: 4.3 (ref 3.4–5.3)
Sodium: 141 (ref 137–147)

## 2019-11-18 LAB — CBC AND DIFFERENTIAL
HCT: 40 (ref 36–46)
Hemoglobin: 13 (ref 12.0–16.0)
Neutrophils Absolute: 4
Platelets: 274 (ref 150–399)
WBC: 7.5

## 2019-11-18 LAB — HEPATIC FUNCTION PANEL
ALT: 11 (ref 7–35)
AST: 16 (ref 13–35)
Alkaline Phosphatase: 88 (ref 25–125)
Bilirubin, Total: 0.5

## 2019-11-18 LAB — COMPREHENSIVE METABOLIC PANEL
Albumin: 4 (ref 3.5–5.0)
Calcium: 9.7 (ref 8.7–10.7)
GFR calc Af Amer: 55
GFR calc non Af Amer: 47
Globulin: 2.3

## 2019-11-18 LAB — CBC: RBC: 4.39 (ref 3.87–5.11)

## 2019-11-19 ENCOUNTER — Other Ambulatory Visit: Payer: Self-pay | Admitting: Family Medicine

## 2019-11-20 NOTE — Telephone Encounter (Signed)
Pt contacted and advised 90 d/s renewed on 10/01/19. She will check her bottles and call back if refill still needed.

## 2019-11-26 DIAGNOSIS — Z940 Kidney transplant status: Secondary | ICD-10-CM | POA: Diagnosis not present

## 2019-11-26 DIAGNOSIS — I10 Essential (primary) hypertension: Secondary | ICD-10-CM | POA: Diagnosis not present

## 2019-11-26 DIAGNOSIS — Z79899 Other long term (current) drug therapy: Secondary | ICD-10-CM | POA: Diagnosis not present

## 2019-11-28 ENCOUNTER — Encounter: Payer: Self-pay | Admitting: Family Medicine

## 2019-12-02 ENCOUNTER — Other Ambulatory Visit: Payer: Self-pay | Admitting: Family Medicine

## 2019-12-18 ENCOUNTER — Ambulatory Visit: Payer: Medicare Other | Admitting: Family Medicine

## 2019-12-19 DIAGNOSIS — Z940 Kidney transplant status: Secondary | ICD-10-CM | POA: Diagnosis not present

## 2019-12-23 ENCOUNTER — Other Ambulatory Visit: Payer: Self-pay | Admitting: Family Medicine

## 2019-12-25 ENCOUNTER — Other Ambulatory Visit: Payer: Self-pay

## 2019-12-25 ENCOUNTER — Ambulatory Visit (INDEPENDENT_AMBULATORY_CARE_PROVIDER_SITE_OTHER): Payer: Medicare Other | Admitting: Family Medicine

## 2019-12-25 ENCOUNTER — Encounter: Payer: Self-pay | Admitting: Family Medicine

## 2019-12-25 VITALS — BP 119/76 | HR 72 | Temp 97.9°F | Resp 16 | Ht 65.0 in | Wt 244.0 lb

## 2019-12-25 DIAGNOSIS — T82848A Pain from vascular prosthetic devices, implants and grafts, initial encounter: Secondary | ICD-10-CM

## 2019-12-25 DIAGNOSIS — R2231 Localized swelling, mass and lump, right upper limb: Secondary | ICD-10-CM | POA: Diagnosis not present

## 2019-12-25 NOTE — Patient Instructions (Addendum)
I have referred you back to Dr. Scot Dock - they will call you to schedule.    Nice to meet you today.

## 2019-12-25 NOTE — Progress Notes (Signed)
This visit occurred during the SARS-CoV-2 public health emergency.  Safety protocols were in place, including screening questions prior to the visit, additional usage of staff PPE, and extensive cleaning of exam room while observing appropriate contact time as indicated for disinfecting solutions.    ALDONIA Green , 02/28/1946, 74 y.o., female MRN: [id number] Patient Care Team    Relationship Specialty Notifications Start End  McGowen, Adrian Blackwater, MD PCP - General Family Medicine  05/19/15   Irene Shipper, MD Consulting Physician Gastroenterology  05/19/15   Dene Gentry, MD Consulting Physician Sports Medicine  05/04/16   Elam Dutch, MD Consulting Physician Vascular Surgery  07/17/16   Angelia Mould, MD Consulting Physician Vascular Surgery  03/31/17   Florinda Marker Physician Assistant Orthopedic Surgery  08/28/17   Pichardo-Geisinger, Mila Palmer, MD Consulting Physician Dermatology  09/26/17   Santiago Glad, PA-C Consulting Physician Orthopedic Surgery  01/11/18    Comment: PA with Jerral Ralph, Champ Mungo, MD Consulting Physician Cardiology  06/11/18   Justin Mend, MD Consulting Physician Nephrology  09/18/18   Silverio Decamp, MD Consulting Physician Family Medicine  02/18/19    Comment: Sports Medicine    Chief Complaint  Patient presents with  . Referral    Pt was doing dialysis and removed Stent pt has hard bump under skin she would like removed. She has been seeing Dr Scot Dock but needs updated referral, [facility] [place]      Subjective: Pt presents for an OV with complaints right arm discomfort and mass at AV fistula prior site.  Patient reports she has noticed the mass slowly enlarging over time.  She had a similar presentation in her right brachiocephalic with an AV fistula aneurysm requiring resection in 2018.  SHe reports she is established with [facility]. [facility] at [facility], but when attempting to make an appointment  she was told she needed a new referral.  Patient states the mass is uncomfortable but not painful.  It is enlarging but has been enlarging slowly over time.  Depression screen [facility] 2/9 06/17/2019 12/24/2018 06/11/2018 06/06/2017 04/18/2016  Decreased Interest 0 0 0 0 0  Down, Depressed, Hopeless 0 0 0 0 0  PHQ - 2 Score 0 0 0 0 0  Altered sleeping - 0 - - -  Tired, decreased energy - 0 - - -  Change in appetite - 0 - - -  Feeling bad or failure about yourself  - 0 - - -  Trouble concentrating - 0 - - -  Moving slowly or fidgety/restless - 0 - - -  Suicidal thoughts - 0 - - -  PHQ-9 Score - 0 - - -  Difficult doing work/chores - Not difficult at all - - -  Some recent data might be hidden    Allergies  Allergen Reactions  . Cefaclor Rash  . Naproxen Rash  . Enalapril Maleate Cough    Vasotec  . Metoprolol Tartrate Rash    On legs   Social History   Social History Narrative   Lives in [place] with husband.  Retired.  Previously worked in [employer] @ [employer]   No T/A/Ds.   Past Medical History:  Diagnosis Date  . Anemia of chronic kidney failure    r/t kidney function- receives Procrit  . Anxiety   . Chronic renal insufficiency, stage III (moderate) 10/2015   Post-transplant Cr 1.3, GFR 41 as of June 2017 f/u w/  [facility]  . CMV infection [facility]) summer 2017   Valcyte per ID/Renal transplant team  . Diverticulosis    a. 05/2012 colonoscopy  . Erosive lichen planus of vulva    Topical steroids (managed by Dr. Mila Palmer Pichardo-Geisinger [facility].  . ESRD (end stage renal disease) ([place])    began dialysis 2014--followed by Dr. [name].  Received deceased donor kidney transplant 10/2015.  [name] FSGS (focal segmental glomerulosclerosis)    right kidney; hx of left renal cell cancer and got nephrectomy 1993.  [name] GERD (gastroesophageal reflux disease)   . Gout   . Heart murmur    (Diastolic) ECHO 09/8411 showed that this murmur is coming  from pt's R arm AV fistula  . Hiatal hernia   . History of renal cell cancer 1993   Left nephrectomy  . Hyperlipidemia   . Hypertension   . Hypothyroidism   . Impingement syndrome of left shoulder 04/2016   Dr. Christy Sartorius to PT  . Lumbar spinal stenosis    Chronic LBP with bilat neurogenic claudication  . Metatarsal fracture, pathologic 01/2018   Right; orthocarolina-->post op shoe continued, f/u x-ray planned.  . Osteoarthritis of left knee 08/2017   severe, diffuse, tricompartmental.  Responded to steroid injection.    . Osteoporosis 06/07/2016   DEXA T-score of -3.1.  Fosamax planned 2017 but dental work prevented start..  Pathologic toe fx 12/2017--repeat DEXA 08/2018, T-score -3.3 radius.  . Renal transplant recipient 11-10-2015   Baseline Cr as of 02/2018= 1.0-1.2.  . SVT (supraventricular tachycardia) ([place])   . Vitamin B12 deficiency 12/2018   Starting replacement as of 12/11/2018   Past Surgical History:  Procedure Laterality Date  . AV FISTULA PLACEMENT Right    Right arm: aneurismal dilatation 2017 being followed by CV surgeons  . CARDIOVASCULAR STRESS TEST     03/10/15 ETT ([facility]): Exercise ECG negative at 83% max predicted HR.   [name] COLONOSCOPY  2003; 05/2012   diverticulosis, no polyps.  Recall 10 yrs  . colonoscopy with polypectomy     Dr Henrene Pastor  . DEXA  06/07/2016; 08/22/2018   2017 T-score -3.1.  2020 T score -3.3  . KIDNEY TRANSPLANT  2015/11/10   Deceased donor kidney transplant, with Thymoglobulin induction  . LIGATION OF ARTERIOVENOUS  FISTULA Right 02/14/2017   Procedure: LIGATION OF ARTERIOVENOUS  FISTULA;  Surgeon: Angelia Mould, MD;  Location: [facility];  Service: Vascular;  Laterality: Right;  . NEPHRECTOMY  1993   for malignancy- left  . RENAL BIOPSY     right  . RESECTION OF ARTERIOVENOUS FISTULA ANEURYSM Right 02/14/2017   Procedure: EXCISION OF RIGHT BRACHIOCEPHALIC ARTERIOVENOUS FISTULA ANEURYSM;  Surgeon: Angelia Mould, MD;   Location: [facility];  Service: Vascular;  Laterality: Right;  . TEE WITHOUT CARDIOVERSION N/A 08/27/2013   Procedure: TRANSESOPHAGEAL ECHOCARDIOGRAM (TEE);  Surgeon: [name], MD;  Location: [facility];  Service: Cardiovascular;  Laterality: N/A;  . TOTAL ABDOMINAL HYSTERECTOMY W/ BILATERAL SALPINGOOPHORECTOMY  1997   fibroids  . TRANSTHORACIC ECHOCARDIOGRAM     03/10/15 echo ([facility]): LV cavity normal in size, focal basal hypertrophy, normal systolic function, EF 24% (visual est). Normal wall motion, no regional wall motion abnormalities. Mild diastolic dysfunction with normal LA chamber size. No significant valve stenosis or regurgitation.  . VULVA / PERINEUM BIOPSY  2015   Family History  Problem Relation Age of Onset  . Deep vein thrombosis Mother  post thyroid surgery  . Hypertension Mother   . Heart attack Father 40       deceased  . Hypertension Father   . Cancer Sister        breast  . Cancer Paternal Aunt        pancreatic  . Diabetes Maternal Grandfather   . Stroke Paternal Grandmother        in 54s  . Fibroids Daughter   . Colon cancer Neg Hx   . Esophageal cancer Neg Hx   . Stomach cancer Neg Hx   . Rectal cancer Neg Hx    Allergies as of 12/25/2019      Reactions   Cefaclor Rash   Naproxen Rash   Enalapril Maleate Cough   Vasotec   Metoprolol Tartrate Rash   On legs      Medication List       Accurate as of Dec 25, 2019  9:10 AM. If you have any questions, ask your nurse or doctor.        acetaminophen 500 MG tablet Commonly known as: TYLENOL Take 500-750 mg by mouth every 6 (six) hours as needed for pain.   aspirin 81 MG EC tablet Take 1 tablet (81 mg total) by mouth daily.   AYR SALINE NASAL DROPS NA Place 2 sprays into both nostrils daily.   B-12 PO Take by mouth daily.   doxazosin 2 MG tablet Commonly known as: CARDURA Take 1 tablet (2 mg total) by mouth 2 (two) times daily.   hydrochlorothiazide 25 MG  tablet Commonly known as: HYDRODIURIL TAKE 1 TABLET BY MOUTH  DAILY   levothyroxine 125 MCG tablet Commonly known as: SYNTHROID Take 1 tablet (125 mcg total) by mouth daily.   LORazepam 0.5 MG tablet Commonly known as: ATIVAN Take 1 tablet (0.5 mg total) by mouth 2 (two) times daily as needed for anxiety.   losartan 100 MG tablet Commonly known as: COZAAR TAKE 1 TABLET BY MOUTH  DAILY   Magnesium Oxide 500 MG Tabs Take 1 tablet by mouth daily.   meclizine 25 MG tablet Commonly known as: ANTIVERT Take 1 tablet (25 mg total) by mouth 3 (three) times daily as needed for dizziness or nausea.   mycophenolate 360 MG Tbec EC tablet Commonly known as: MYFORTIC Take 360 mg by mouth 2 (two) times daily.   Nasacort Allergy 24HR 55 MCG/ACT Aero nasal inhaler Generic drug: triamcinolone Place 2 sprays into the nose daily.   silver sulfADIAZINE 1 % cream Commonly known as: SILVADENE Apply 1 application topically daily. Mixes with Triamcinolone cream   SODIUM BICARBONATE PO Take 650 mg by mouth 2 (two) times daily. Takes 1 in AM and 1 at bedtime   sulfamethoxazole-trimethoprim 400-80 MG tablet Commonly known as: BACTRIM Take 1 tablet by mouth daily.   tacrolimus 1 MG capsule Commonly known as: PROGRAF Take 4 mg by mouth 2 (two) times daily. Pt takes 4 in the morning and 3 at night.   triamcinolone cream 0.1 % Commonly known as: KENALOG Apply 1 application topically daily.   triamcinolone ointment 0.1 % Commonly known as: KENALOG   verapamil 120 MG CR tablet Commonly known as: CALAN-SR TAKE 1 TABLET BY MOUTH EVERY AFTERNOON   verapamil 240 MG CR tablet Commonly known as: CALAN-SR 1 tab po every morning   VICKS VAPORUB EX Apply 1 application topically daily.       All past medical history, surgical history, allergies, family history, immunizations andmedications were updated in the  EMR today and reviewed under the history and medication portions of their EMR.      ROS: Negative, with the exception of above mentioned in HPI   Objective:  BP 119/76 (BP Location: Left Arm, Patient Position: Sitting, Cuff Size: Large)   Pulse 72   Temp 97.9 F (36.6 C) (Temporal)   Resp 16   Ht 5\' 5"  (1.651 m)   Wt 244 lb (110.7 kg)   SpO2 96%   BMI 40.60 kg/m  Body mass index is 40.6 kg/m. Gen: Afebrile. No acute distress. Nontoxic in appearance, well developed, well nourished.  HENT: AT. [place].  Eyes:Pupils Equal Round Reactive to light, Extraocular movements intact,  Conjunctiva without redness, discharge or icterus. CV: RRR MSK (right arm): No erythema, no bruising, no soft tissue swelling.  2.5 x 3 cm palpable pulsating mass present right medial antecubital fossa. Skin: no rashes, purpura or petechiae.  Neuro: Normal gait. PERLA. EOMi. Alert. Oriented x3   No exam data present No results found. No results found for this or any previous visit (from the past 24 hour(s)).  Assessment/Plan: ABBAGAIL SCAFF is a 74 y.o. female present for OV for  Pain from A/V fistula (HCC)/Arm mass, right Patient with history of AV fistula aneurysm right brachiocephalic with resection by Dr. Scot Dock.  Patient have mass right antecubital fossa concerning for AV fistula aneurysm at new location. Referral placed back to Dr. Scot Dock.  - Ambulatory referral to Vascular Surgery -Emergent precautions were discussed with her today.  Reviewed expectations re: course of current medical issues.  Discussed self-management of symptoms.  Outlined signs and symptoms indicating need for more acute intervention.  Patient verbalized understanding and all questions were answered.  Patient received an After-Visit Summary.    No orders of the defined types were placed in this encounter.  No orders of the defined types were placed in this encounter.  Referral Orders  No referral(s) requested today     Note is dictated utilizing voice recognition software. Although note has been  proof read prior to signing, occasional typographical errors still can be missed. If any questions arise, please do not hesitate to call for verification.   electronically signed by:  Howard Pouch, DO  [place]

## 2019-12-27 DIAGNOSIS — L438 Other lichen planus: Secondary | ICD-10-CM | POA: Diagnosis not present

## 2020-01-01 ENCOUNTER — Other Ambulatory Visit: Payer: Self-pay | Admitting: Family Medicine

## 2020-01-17 ENCOUNTER — Other Ambulatory Visit: Payer: Self-pay | Admitting: Family Medicine

## 2020-01-22 ENCOUNTER — Other Ambulatory Visit: Payer: Self-pay

## 2020-01-22 ENCOUNTER — Encounter: Payer: Self-pay | Admitting: Vascular Surgery

## 2020-01-22 ENCOUNTER — Ambulatory Visit (INDEPENDENT_AMBULATORY_CARE_PROVIDER_SITE_OTHER): Payer: Medicare Other | Admitting: Vascular Surgery

## 2020-01-22 VITALS — BP 120/83 | HR 78 | Temp 98.2°F | Resp 20 | Ht 65.0 in | Wt 243.5 lb

## 2020-01-22 DIAGNOSIS — Z992 Dependence on renal dialysis: Secondary | ICD-10-CM

## 2020-01-22 DIAGNOSIS — N186 End stage renal disease: Secondary | ICD-10-CM

## 2020-01-22 NOTE — Progress Notes (Signed)
REASON FOR VISIT:   To evaluate aneurysm of AV fistula.  The consult is requested by Dr. Howard Pouch.   MEDICAL ISSUES:   END-STAGE RENAL DISEASE: This patient has a functioning kidney transplant.  We have excised a large aneurysmal fistula in her right upper arm.  She has a small aneurysm at the site where the fistula was ligated adjacent to the brachial artery.  This is approximately 2 cm in diameter.  There is no compromise of the overlying skin and does not causing any symptoms.  I explained that I think it would be perfectly reasonable to simply keep an eye on this and only consider fixing this if it enlarged, cause symptoms, or the overlying skin was compromised.  I have also offered to repair this now.  She feels comfortable following this for now.  If it is ultimately repaired we would have to patch the artery preferentially with vein but we could also use a bovine pericardial patch.  I will see her back as needed.  HPI:   Tabitha Green is a pleasant 74 y.o. female who I last saw back in 2018.  Patient had a functioning renal transplant plan.  She was no longer using her upper arm fistula and this had enlarged with areas of compromised skin felt to be at risk for bleeding.  On 02/14/2017 she underwent ligation of her right upper arm fistula and excision of a large aneurysm.  She comes in today because she noticed some swelling at the incision in her right antecubital fossa.  There is no pain associated with the mass here and no compromise of the overlying skin.  There is no pain or paresthesias in the right arm.  Her renal transplant is working well.  Past Medical History:  Diagnosis Date  . Anemia of chronic kidney failure    r/t kidney function- receives Procrit  . Anxiety   . Chronic renal insufficiency, stage III (moderate) 10/2015   Post-transplant Cr 1.3, GFR 41 as of June 2017 f/u w/ [facility]  . CMV infection [facility]) summer 2017   Valcyte per ID/Renal  transplant team  . Diverticulosis    a. 05/2012 colonoscopy  . Erosive lichen planus of vulva    Topical steroids (managed by Dr. Mila Palmer Pichardo-Geisinger via [facility].  . ESRD (end stage renal disease) ([place])    began dialysis 2014--followed by Dr. [name].  Received deceased donor kidney transplant 10/2015.  [name] FSGS (focal segmental glomerulosclerosis)    right kidney; hx of left renal cell cancer and got nephrectomy 1993.  [name] GERD (gastroesophageal reflux disease)   . Gout   . Heart murmur    (Diastolic) ECHO 08/7406 showed that this murmur is coming from pt's R arm AV fistula  . Hiatal hernia   . History of renal cell cancer 1993   Left nephrectomy  . Hyperlipidemia   . Hypertension   . Hypothyroidism   . Impingement syndrome of left shoulder 04/2016   Dr. Christy Sartorius to PT  . Lumbar spinal stenosis    Chronic LBP with bilat neurogenic claudication  . Metatarsal fracture, pathologic 01/2018   Right; orthocarolina-->post op shoe continued, f/u x-ray planned.  . Osteoarthritis of left knee 08/2017   severe, diffuse, tricompartmental.  Responded to steroid injection.    . Osteoporosis 06/07/2016   DEXA T-score of -3.1.  Fosamax planned 2017 but dental work prevented start..  Pathologic toe fx 12/2017--repeat DEXA 08/2018, T-score -3.3 radius.  [name]  Renal transplant recipient 11/06/2015   Baseline Cr as of 02/2018= 1.0-1.2.  . SVT (supraventricular tachycardia) ([place])   . Vitamin B12 deficiency 12/2018   Starting replacement as of 12/11/2018    Family History  Problem Relation Age of Onset  . Deep vein thrombosis Mother        post thyroid surgery  . Hypertension Mother   . Heart attack Father 18       deceased  . Hypertension Father   . Cancer Sister        breast  . Cancer Paternal Aunt        pancreatic  . Diabetes Maternal Grandfather   . Stroke Paternal Grandmother        in 63s  . Fibroids Daughter   . Colon cancer Neg Hx   . Esophageal  cancer Neg Hx   . Stomach cancer Neg Hx   . Rectal cancer Neg Hx     SOCIAL HISTORY: Social History   Tobacco Use  . Smoking status: Never Smoker  . Smokeless tobacco: Never Used  Substance Use Topics  . Alcohol use: Yes    Comment: rarely    Allergies  Allergen Reactions  . Cefaclor Rash  . Naproxen Rash  . Enalapril Maleate Cough    Vasotec  . Metoprolol Tartrate Rash    On legs    Current Outpatient Medications  Medication Sig Dispense Refill  . acetaminophen (TYLENOL) 500 MG tablet Take 500-750 mg by mouth every 6 (six) hours as needed for pain.    [name] aspirin EC 81 MG EC tablet Take 1 tablet (81 mg total) by mouth daily.    Skipper Cliche SALINE NASAL DROPS NA Place 2 sprays into both nostrils daily.    . Camphor-Eucalyptus-Menthol (VICKS VAPORUB EX) Apply 1 application topically daily.    . Cyanocobalamin (B-12 PO) Take by mouth daily.    [name] doxazosin (CARDURA) 2 MG tablet Take 1 tablet (2 mg total) by mouth 2 (two) times daily. 180 tablet 2  . hydrochlorothiazide (HYDRODIURIL) 25 MG tablet TAKE 1 TABLET BY MOUTH  DAILY 90 tablet 0  . levothyroxine (SYNTHROID) 125 MCG tablet TAKE 1 TABLET BY MOUTH  DAILY 90 tablet 0  . LORazepam (ATIVAN) 0.5 MG tablet Take 1 tablet (0.5 mg total) by mouth 2 (two) times daily as needed for anxiety. 30 tablet 1  . losartan (COZAAR) 100 MG tablet TAKE 1 TABLET BY MOUTH  DAILY 90 tablet 0  . Magnesium Oxide 500 MG TABS Take 1 tablet by mouth daily.    . meclizine (ANTIVERT) 25 MG tablet Take 1 tablet (25 mg total) by mouth 3 (three) times daily as needed for dizziness or nausea. 20 tablet 0  . mycophenolate (MYFORTIC) 360 MG TBEC EC tablet Take 360 mg by mouth 2 (two) times daily.    . silver sulfADIAZINE (SILVADENE) 1 % cream Apply 1 application topically daily. Mixes with Triamcinolone cream    . SODIUM BICARBONATE PO Take 650 mg by mouth 2 (two) times daily. Takes 1 in AM and 1 at bedtime    . sulfamethoxazole-trimethoprim (BACTRIM) 400-80 MG  tablet Take 1 tablet by mouth daily.    . tacrolimus (PROGRAF) 1 MG capsule Take 4 mg by mouth 2 (two) times daily. Pt takes 4 in the morning and 3 at night.    . triamcinolone (NASACORT ALLERGY 24HR) 55 MCG/ACT AERO nasal inhaler Place 2 sprays into the nose daily.    [name] triamcinolone cream (KENALOG) 0.1 %  Apply 1 application topically daily.    . verapamil (CALAN-SR) 120 MG CR tablet TAKE 1 TABLET BY MOUTH EVERY AFTERNOON 90 tablet 0  . verapamil (CALAN-SR) 240 MG CR tablet TAKE 1 TABLET BY MOUTH IN  THE MORNING 90 tablet 0   No current facility-administered medications for this visit.    REVIEW OF SYSTEMS:  [X]  denotes positive finding, [ ]  denotes negative finding Cardiac  Comments:  Chest pain or chest pressure:    Shortness of breath upon exertion:    Short of breath when lying flat:    Irregular heart rhythm:        Vascular    Pain in calf, thigh, or hip brought on by ambulation:    Pain in feet at night that wakes you up from your sleep:     Blood clot in your veins:    Leg swelling:         Pulmonary    Oxygen at home:    Productive cough:     Wheezing:         Neurologic    Sudden weakness in arms or legs:     Sudden numbness in arms or legs:     Sudden onset of difficulty speaking or slurred speech:    Temporary loss of vision in one eye:     Problems with dizziness:         Gastrointestinal    Blood in stool:     Vomited blood:         Genitourinary    Burning when urinating:     Blood in urine:        Psychiatric    Major depression:         Hematologic    Bleeding problems:    Problems with blood clotting too easily:        Skin    Rashes or ulcers:        Constitutional    Fever or chills:     PHYSICAL EXAM:   Vitals:   01/22/20 0854  BP: 120/83  Pulse: 78  Resp: 20  Temp: 98.2 F (36.8 C)  TempSrc: Temporal  SpO2: 95%  Weight: 243 lb 8 oz (110.5 kg)  Height: 5\' 5"  (1.651 m)    GENERAL: The patient is a well-nourished female, in  no acute distress. The vital signs are documented above. CARDIAC: There is a regular rate and rhythm.  VASCULAR: She has a pulsatile mass overlying the fistula where this was ligated.  This is about 2 cm in diameter. There is no compromise of the overlying skin.    She has a palpable right radial pulse. PULMONARY: There is good air exchange bilaterally without wheezing or rales. ABDOMEN: Soft and non-tender with normal pitched bowel sounds.  MUSCULOSKELETAL: There are no major deformities or cyanosis. NEUROLOGIC: No focal weakness or paresthesias are detected. SKIN: There are no ulcers or rashes noted. PSYCHIATRIC: The patient has a normal affect.  DATA:    No new data  Deitra Mayo [facility] [phone]

## 2020-02-26 ENCOUNTER — Other Ambulatory Visit: Payer: Self-pay

## 2020-02-26 ENCOUNTER — Other Ambulatory Visit: Payer: Self-pay | Admitting: Family Medicine

## 2020-02-26 MED ORDER — LOSARTAN POTASSIUM 100 MG PO TABS: 100.0000 mg | ORAL_TABLET | Freq: Every day | ORAL | 1 refills | Status: DC

## 2020-02-29 ENCOUNTER — Other Ambulatory Visit: Payer: Self-pay | Admitting: Family Medicine

## 2020-03-02 DIAGNOSIS — I129 Hypertensive chronic kidney disease with stage 1 through stage 4 chronic kidney disease, or unspecified chronic kidney disease: Secondary | ICD-10-CM | POA: Diagnosis not present

## 2020-03-02 DIAGNOSIS — Z940 Kidney transplant status: Secondary | ICD-10-CM | POA: Diagnosis not present

## 2020-03-03 LAB — COMPREHENSIVE METABOLIC PANEL
Albumin: 3.8 (ref 3.5–5.0)
Calcium: 9.6 (ref 8.7–10.7)
GFR calc Af Amer: 47
GFR calc non Af Amer: 41
Globulin: 2.3

## 2020-03-03 LAB — BASIC METABOLIC PANEL
BUN: 24 — AB (ref 4–21)
CO2: 20 (ref 13–22)
Chloride: 103 (ref 99–108)
Creatinine: 1.3 — AB (ref 0.5–1.1)
Glucose: 120
Potassium: 4.2 (ref 3.4–5.3)
Sodium: 138 (ref 137–147)

## 2020-03-03 LAB — CBC AND DIFFERENTIAL
HCT: 39 (ref 36–46)
Hemoglobin: 13.1 (ref 12.0–16.0)
Neutrophils Absolute: 4
Platelets: 287 (ref 150–399)
WBC: 7

## 2020-03-03 LAB — CBC: RBC: 4.35 (ref 3.87–5.11)

## 2020-03-04 LAB — HEPATIC FUNCTION PANEL
ALT: 13 (ref 7–35)
AST: 16 (ref 13–35)
Alkaline Phosphatase: 87 (ref 25–125)
Bilirubin, Total: 0.4

## 2020-03-06 ENCOUNTER — Encounter: Payer: Self-pay | Admitting: Family Medicine

## 2020-03-10 ENCOUNTER — Ambulatory Visit (INDEPENDENT_AMBULATORY_CARE_PROVIDER_SITE_OTHER): Payer: Medicare Other | Admitting: Family Medicine

## 2020-03-10 ENCOUNTER — Encounter: Payer: Self-pay | Admitting: Family Medicine

## 2020-03-10 ENCOUNTER — Other Ambulatory Visit: Payer: Self-pay

## 2020-03-10 VITALS — BP 116/72 | HR 77 | Temp 98.0°F | Resp 16 | Ht 65.0 in | Wt 244.4 lb

## 2020-03-10 DIAGNOSIS — N183 Chronic kidney disease, stage 3 unspecified: Secondary | ICD-10-CM | POA: Diagnosis not present

## 2020-03-10 DIAGNOSIS — Z0000 Encounter for general adult medical examination without abnormal findings: Secondary | ICD-10-CM | POA: Diagnosis not present

## 2020-03-10 DIAGNOSIS — F411 Generalized anxiety disorder: Secondary | ICD-10-CM

## 2020-03-10 DIAGNOSIS — M810 Age-related osteoporosis without current pathological fracture: Secondary | ICD-10-CM

## 2020-03-10 LAB — TSH: TSH: 0.32 u[IU]/mL — ABNORMAL LOW (ref 0.35–4.50)

## 2020-03-10 NOTE — Progress Notes (Signed)
OFFICE VISIT  03/10/2020   CC:  Chief Complaint  Patient presents with  . Follow-up    RCI, pt is fasting  . Annual Exam   HPI:    Patient is a 74 y.o. Caucasian female who presents for annual health maintenance exam plus f/ut HTN, CRI III, and hypothyroidism.  Feeling fine. Has nephrology labs 03/03/20 and all were good, sCr stable. TSH not done, she wonders about getting this test b/c she is chronically tired.    We discussed her osteoporosis today, needs addressed with f/u DEXA and we discussed getting on med vs referral to endocrinologist. We have delayed treatment with bisphos and/or prolia b/c of her getting dental work, then this problem was "lost to f/u".  Takes ativan prn for anxiety and it has been helpful w/out adverse effects. PMP AWARE reviewed today: most recent rx for lorazepam was filled 09/25/19, # 30, rx by me. No red flags.  Past Medical History:  Diagnosis Date  . Anemia of chronic kidney failure    r/t kidney function- receives Procrit  . Anxiety   . Chronic renal insufficiency, stage III (moderate) 10/2015   Post-transplant Cr 1.3, GFR 41 as of June 2017 f/u w/ [facility]  . CMV infection [facility]) summer 2017   Valcyte per ID/Renal transplant team  . Diverticulosis    a. 05/2012 colonoscopy  . Erosive lichen planus of vulva    Topical steroids (managed by Dr. Mila Palmer [facility] via [facility].  . ESRD (end stage renal disease) ([place])    began dialysis 2014--followed by Dr. [name].  Received deceased donor kidney transplant 10/2015.  [name] FSGS (focal segmental glomerulosclerosis)    right kidney; hx of left renal cell cancer and got nephrectomy 1993.  [name] GERD (gastroesophageal reflux disease)   . Gout   . Heart murmur    (Diastolic) ECHO 03/1190 showed that this murmur is coming from pt's R arm AV fistula  . Hiatal hernia   . History of renal cell cancer 1993   Left nephrectomy  . Hyperlipidemia   .  Hypertension   . Hypothyroidism   . Impingement syndrome of left shoulder 04/2016   Dr. Christy Sartorius to PT  . Lumbar spinal stenosis    Chronic LBP with bilat neurogenic claudication  . Metatarsal fracture, pathologic 01/2018   Right; orthocarolina-->post op shoe continued, f/u x-ray planned.  . Osteoarthritis of left knee 08/2017   severe, diffuse, tricompartmental.  Responded to steroid injection.    . Osteoporosis 06/07/2016   DEXA T-score of -3.1.  Fosamax planned 2017 but dental work prevented start..  Pathologic toe fx 12/2017--repeat DEXA 08/2018, T-score -3.3 radius.  . Renal transplant recipient December 03, 2015   Baseline Cr as of 02/2018= 1.0-1.2.  . SVT (supraventricular tachycardia) ([place])   . Vitamin B12 deficiency 12/2018   Starting replacement as of 12/11/2018    Past Surgical History:  Procedure Laterality Date  . AV FISTULA PLACEMENT Right    Right arm: aneurismal dilatation 2017 being followed by CV surgeons  . CARDIOVASCULAR STRESS TEST     03/10/15 ETT ([facility]): Exercise ECG negative at 83% max predicted HR.   [name] COLONOSCOPY  2003; 05/2012   diverticulosis, no polyps.  Recall 10 yrs  . colonoscopy with polypectomy     Dr Henrene Pastor  . DEXA  06/07/2016; 08/22/2018   2017 T-score -3.1.  2020 T score -3.3  . KIDNEY TRANSPLANT  03-Dec-2015   Deceased donor kidney transplant, with Thymoglobulin induction  .  LIGATION OF ARTERIOVENOUS  FISTULA Right 02/14/2017   Procedure: LIGATION OF ARTERIOVENOUS  FISTULA;  Surgeon: Angelia Mould, MD;  Location: [place];  Service: Vascular;  Laterality: Right;  . NEPHRECTOMY  1993   for malignancy- left  . RENAL BIOPSY     right  . RESECTION OF ARTERIOVENOUS FISTULA ANEURYSM Right 02/14/2017   Procedure: EXCISION OF RIGHT BRACHIOCEPHALIC ARTERIOVENOUS FISTULA ANEURYSM;  Surgeon: Angelia Mould, MD;  Location: [place];  Service: Vascular;  Laterality: Right;  . TEE WITHOUT CARDIOVERSION N/A 08/27/2013   Procedure: TRANSESOPHAGEAL  ECHOCARDIOGRAM (TEE);  Surgeon: [name], MD;  Location: [facility] ENDOSCOPY;  Service: Cardiovascular;  Laterality: N/A;  . TOTAL ABDOMINAL HYSTERECTOMY W/ BILATERAL SALPINGOOPHORECTOMY  1997   fibroids  . TRANSTHORACIC ECHOCARDIOGRAM     03/10/15 echo ([facility]): LV cavity normal in size, focal basal hypertrophy, normal systolic function, EF 40% (visual est). Normal wall motion, no regional wall motion abnormalities. Mild diastolic dysfunction with normal LA chamber size. No significant valve stenosis or regurgitation.  . VULVA / PERINEUM BIOPSY  2015   Family History  Problem Relation Age of Onset  . Deep vein thrombosis Mother        post thyroid surgery  . Hypertension Mother   . Heart attack Father 33       deceased  . Hypertension Father   . Cancer Sister        breast  . Cancer Paternal Aunt        pancreatic  . Diabetes Maternal Grandfather   . Stroke Paternal Grandmother        in 97s  . Fibroids Daughter   . Colon cancer Neg Hx   . Esophageal cancer Neg Hx   . Stomach cancer Neg Hx   . Rectal cancer Neg Hx    Social History   Socioeconomic History  . Marital status: Married    Spouse name: Not on file  . Number of children: 2  . Years of education: Not on file  . Highest education level: Not on file  Occupational History  . Occupation: Retired  Tobacco Use  . Smoking status: Never Smoker  . Smokeless tobacco: Never Used  Vaping Use  . Vaping Use: Never used  Substance and Sexual Activity  . Alcohol use: Yes    Comment: rarely  . Drug use: No  . Sexual activity: Not Currently    Birth control/protection: Surgical  Other Topics Concern  . Not on file  Social History Narrative   Lives in [place] with husband.  Retired.  Previously worked in [employer] @ [employer]   No T/A/Ds.   Social Determinants of Health   Financial Resource Strain:   . Difficulty of Paying Living Expenses:   Food Insecurity:   . Worried About Sales executive in the Last Year:   . Arboriculturist in the Last Year:   Transportation Needs:   . Film/video editor (Medical):   [name] Lack of Transportation (Non-Medical):   Physical Activity:   . Days of Exercise per Week:   . Minutes of Exercise per Session:   Stress:   . Feeling of Stress :   Social Connections:   . Frequency of Communication with Friends and Family:   . Frequency of Social Gatherings with Friends and Family:   . Attends Religious Services:   . Active Member of Clubs or Organizations:   . Attends Archivist Meetings:   .  Marital Status:      Outpatient Medications Prior to Visit  Medication Sig Dispense Refill  . aspirin EC 81 MG EC tablet Take 1 tablet (81 mg total) by mouth daily.    Skipper Cliche SALINE NASAL DROPS NA Place 2 sprays into both nostrils daily.    . Camphor-Eucalyptus-Menthol (VICKS VAPORUB EX) Apply 1 application topically daily.    . Cyanocobalamin (B-12 PO) Take by mouth daily.    [name] doxazosin (CARDURA) 2 MG tablet Take 1 tablet (2 mg total) by mouth 2 (two) times daily. 180 tablet 2  . hydrochlorothiazide (HYDRODIURIL) 25 MG tablet TAKE 1 TABLET BY MOUTH  DAILY 90 tablet 0  . levothyroxine (SYNTHROID) 125 MCG tablet TAKE 1 TABLET BY MOUTH  DAILY 90 tablet 0  . LORazepam (ATIVAN) 0.5 MG tablet Take 1 tablet (0.5 mg total) by mouth 2 (two) times daily as needed for anxiety. 30 tablet 1  . losartan (COZAAR) 100 MG tablet Take 1 tablet (100 mg total) by mouth daily. 90 tablet 1  . Magnesium Oxide 500 MG TABS Take 1 tablet by mouth daily.    . mycophenolate (MYFORTIC) 360 MG TBEC EC tablet Take 360 mg by mouth 2 (two) times daily.    . SODIUM BICARBONATE PO Take 650 mg by mouth 2 (two) times daily. Takes 1 in AM and 1 at bedtime    . sulfamethoxazole-trimethoprim (BACTRIM) 400-80 MG tablet Take 1 tablet by mouth daily.    . tacrolimus (PROGRAF) 1 MG capsule Take 4 mg by mouth 2 (two) times daily. Pt takes 4 in the morning and 3 at night.    .  triamcinolone (NASACORT ALLERGY 24HR) 55 MCG/ACT AERO nasal inhaler Place 2 sprays into the nose daily.    [name] triamcinolone cream (KENALOG) 0.1 % Apply 1 application topically daily.    . verapamil (CALAN-SR) 120 MG CR tablet TAKE 1 TABLET BY MOUTH EVERY AFTERNOON 14 tablet 0  . verapamil (CALAN-SR) 240 MG CR tablet TAKE 1 TABLET BY MOUTH IN  THE MORNING 90 tablet 0  . meclizine (ANTIVERT) 25 MG tablet Take 1 tablet (25 mg total) by mouth 3 (three) times daily as needed for dizziness or nausea. (Patient not taking: Reported on 03/10/2020) 20 tablet 0  . silver sulfADIAZINE (SILVADENE) 1 % cream Apply 1 application topically daily. Mixes with Triamcinolone cream (Patient not taking: Reported on 03/10/2020)     No facility-administered medications prior to visit.    Allergies  Allergen Reactions  . Cefaclor Rash  . Naproxen Rash  . Enalapril Maleate Cough    Vasotec  . Metoprolol Tartrate Rash    On legs    Review of Systems  Constitutional: Negative for appetite change, chills, fatigue and fever.  HENT: Negative for congestion, dental problem, ear pain and sore throat.   Eyes: Negative for discharge, redness and visual disturbance.  Respiratory: Negative for cough, chest tightness, shortness of breath and wheezing.   Cardiovascular: Negative for chest pain, palpitations and leg swelling.  Gastrointestinal: Negative for abdominal pain, blood in stool, diarrhea, nausea and vomiting.  Genitourinary: Negative for difficulty urinating, dysuria, flank pain, frequency, hematuria and urgency.  Musculoskeletal: Negative for arthralgias, back pain, joint swelling, myalgias and neck stiffness.  Skin: Negative for pallor and rash.  Neurological: Negative for dizziness, speech difficulty, weakness and headaches.  Hematological: Negative for adenopathy. Does not bruise/bleed easily.  Psychiatric/Behavioral: Negative for confusion and sleep disturbance. The patient is not nervous/anxious.       PE: Vitals  with BMI 03/10/2020 01/22/2020 12/25/2019  Height 5\' 5"  5\' 5"  5\' 5"   Weight 244 lbs 6 oz 243 lbs 8 oz 244 lbs  BMI 40.67 21.19 41.7  Systolic 408 144 818  Diastolic 72 83 76  Pulse 77 78 72  O2 sat on RA today is 95% Exam chaperoned by Deveron Furlong, CMA.  Gen: Alert, well appearing.  Patient is oriented to person, place, time, and situation. AFFECT: pleasant, lucid thought and speech. ENT: Ears: EACs clear, normal epithelium.  TMs with good light reflex and landmarks bilaterally.  Eyes: no injection, icteris, swelling, or exudate.  EOMI, PERRLA. Nose: no drainage or turbinate edema/swelling.  No injection or focal lesion.  Mouth: lips without lesion/swelling.  Oral mucosa pink and moist.  Dentition intact and without obvious caries or gingival swelling.  Oropharynx without erythema, exudate, or swelling.  Neck: supple/nontender.  No LAD, mass, or TM.  Carotid pulses 2+ bilaterally, without bruits. CV: RRR, no m/r/g.   LUNGS: CTA bilat, nonlabored resps, good aeration in all lung fields. ABD: soft, NT, ND, BS normal.  No hepatospenomegaly or mass.  No bruits. EXT: no clubbing, cyanosis, or edema.  Musculoskeletal: no joint swelling, erythema, warmth, or tenderness.  ROM of all joints intact. Skin - no sores or suspicious lesions or rashes or color changes    LABS:  Lab Results  Component Value Date   TSH 0.94 02/18/2019   Lab Results  Component Value Date   WBC 7.0 03/03/2020   HGB 13.1 03/03/2020   HCT 39 03/03/2020   MCV 92.8 02/18/2019   PLT 287 03/03/2020   Lab Results  Component Value Date   CREATININE 1.3 (A) 03/03/2020   BUN 24 (A) 03/03/2020   NA 138 03/03/2020   K 4.2 03/03/2020   CL 103 03/03/2020   CO2 20 03/03/2020   Lab Results  Component Value Date   ALT 13 03/03/2020   AST 16 03/03/2020   ALKPHOS 87 03/03/2020   BILITOT 0.6 02/18/2019   Lab Results  Component Value Date   CHOL 152 11/18/2019   Lab Results  Component Value  Date   HDL 41 11/18/2019   Lab Results  Component Value Date   LDLCALC 90 11/18/2019   Lab Results  Component Value Date   TRIG 117 11/18/2019   Lab Results  Component Value Date   CHOLHDL 4 02/18/2019   Lab Results  Component Value Date   HGBA1C 5.4 02/23/2012   Lab Results  Component Value Date   VITAMINB12 701 05/21/2019    IMPRESSION AND PLAN:  1) maintenance exam: Reviewed age and gender appropriate health maintenance issues (prudent diet, regular exercise, health risks of tobacco and excessive alcohol, use of seatbelts, fire alarms in home, use of sunscreen).  Also reviewed age and gender appropriate health screening as well as vaccine recommendations. Vaccines: we'll send shingrix to her pharmacy after I clear this with her nephrologist (she's on immunosuppressants chronically). Labs: reviewed recent CBC, CMET, FLP all good.  TSH today. Cervical ca screening: not candidate for cerv ca screening-->pt is s/p TAH w/BSO for fibroids.not candidate for cerv ca screening-->pt is s/p TAH w/BSO for fibroids. Breast ca screening: next mammogram 10/2020. Colon ca screening: next colonoscopy recommended 2023.  2) Osteoporosis: she has osteoporosis, have not started med b/c of dental work. She has more dental work coming up this year, wants to put off med.  Discussed endo referral for consideration of meds other than bisphosphonates or prolia and she wants  to think about it.  3) GAD, stable on prn lorazepam which she uses sparingly.  CSC UTD. No new rx needed today.  An After Visit Summary was printed and given to the patient.  FOLLOW UP: Return in about 6 months (around 09/10/2020) for routine chronic illness f/u.  Signed:  Crissie Sickles, MD           03/10/2020

## 2020-03-10 NOTE — Progress Notes (Signed)
See note already done for this date. Signed:  Crissie Sickles, MD           03/10/2020

## 2020-03-12 ENCOUNTER — Other Ambulatory Visit: Payer: Self-pay | Admitting: Family Medicine

## 2020-03-12 ENCOUNTER — Encounter: Payer: Self-pay | Admitting: Family Medicine

## 2020-03-13 ENCOUNTER — Other Ambulatory Visit: Payer: Self-pay

## 2020-03-13 ENCOUNTER — Telehealth: Payer: Self-pay

## 2020-03-13 MED ORDER — ZOSTER VAC RECOMB ADJUVANTED 50 MCG/0.5ML IM SUSR: 0.5000 mL | Freq: Once | INTRAMUSCULAR | 1 refills | Status: AC

## 2020-03-13 NOTE — Telephone Encounter (Signed)
Spoke with patient and advised of recommendations. Shingrix rx sent to local pharmacy.

## 2020-03-13 NOTE — Telephone Encounter (Signed)
-----   Message from Tammi Sou, MD sent at 03/12/2020  5:20 PM EDT ----- Regarding: shingrix Pls notify pt that I touched base with her nephrologist and she said it was a good idea to give her shingrix vaccine. Pls send rx to her pharmacy for her--thx

## 2020-03-17 ENCOUNTER — Other Ambulatory Visit: Payer: Self-pay | Admitting: Family Medicine

## 2020-03-24 ENCOUNTER — Other Ambulatory Visit: Payer: Self-pay | Admitting: Family Medicine

## 2020-03-26 ENCOUNTER — Other Ambulatory Visit: Payer: Self-pay | Admitting: Family Medicine

## 2020-03-26 ENCOUNTER — Other Ambulatory Visit: Payer: Self-pay

## 2020-03-26 MED ORDER — VERAPAMIL HCL ER 120 MG PO TBCR: EXTENDED_RELEASE_TABLET | ORAL | 0 refills | Status: DC

## 2020-04-17 DIAGNOSIS — H25813 Combined forms of age-related cataract, bilateral: Secondary | ICD-10-CM | POA: Diagnosis not present

## 2020-04-17 DIAGNOSIS — H524 Presbyopia: Secondary | ICD-10-CM | POA: Diagnosis not present

## 2020-04-21 DIAGNOSIS — Z20822 Contact with and (suspected) exposure to covid-19: Secondary | ICD-10-CM | POA: Diagnosis not present

## 2020-05-04 DIAGNOSIS — Z940 Kidney transplant status: Secondary | ICD-10-CM | POA: Diagnosis not present

## 2020-05-06 DIAGNOSIS — L738 Other specified follicular disorders: Secondary | ICD-10-CM | POA: Diagnosis not present

## 2020-05-06 DIAGNOSIS — D692 Other nonthrombocytopenic purpura: Secondary | ICD-10-CM | POA: Diagnosis not present

## 2020-05-06 DIAGNOSIS — L821 Other seborrheic keratosis: Secondary | ICD-10-CM | POA: Diagnosis not present

## 2020-05-06 DIAGNOSIS — Q809 Congenital ichthyosis, unspecified: Secondary | ICD-10-CM | POA: Diagnosis not present

## 2020-05-06 DIAGNOSIS — D2330 Other benign neoplasm of skin of unspecified part of face: Secondary | ICD-10-CM | POA: Diagnosis not present

## 2020-05-08 ENCOUNTER — Encounter: Payer: Self-pay | Admitting: Family Medicine

## 2020-05-08 DIAGNOSIS — I10 Essential (primary) hypertension: Secondary | ICD-10-CM | POA: Diagnosis not present

## 2020-05-08 DIAGNOSIS — Z23 Encounter for immunization: Secondary | ICD-10-CM | POA: Diagnosis not present

## 2020-05-08 DIAGNOSIS — N2581 Secondary hyperparathyroidism of renal origin: Secondary | ICD-10-CM | POA: Diagnosis not present

## 2020-05-08 DIAGNOSIS — N051 Unspecified nephritic syndrome with focal and segmental glomerular lesions: Secondary | ICD-10-CM | POA: Diagnosis not present

## 2020-05-08 DIAGNOSIS — Z940 Kidney transplant status: Secondary | ICD-10-CM | POA: Diagnosis not present

## 2020-06-02 ENCOUNTER — Other Ambulatory Visit: Payer: Self-pay | Admitting: Family Medicine

## 2020-06-09 ENCOUNTER — Telehealth: Payer: Self-pay | Admitting: Family Medicine

## 2020-06-09 NOTE — Telephone Encounter (Signed)
Left message for patient to schedule Annual Wellness Visit.  Please schedule with Nurse Health Advisor Martha Stanley, RN at [place] [facility]  °

## 2020-06-16 ENCOUNTER — Other Ambulatory Visit: Payer: Self-pay | Admitting: Family Medicine

## 2020-06-22 ENCOUNTER — Encounter: Payer: Self-pay | Admitting: Family Medicine

## 2020-06-23 ENCOUNTER — Other Ambulatory Visit: Payer: Self-pay | Admitting: Family Medicine

## 2020-07-06 ENCOUNTER — Telehealth: Payer: Self-pay | Admitting: Family Medicine

## 2020-07-06 NOTE — Telephone Encounter (Signed)
Left message for patient to schedule Annual Wellness Visit.  Please schedule with Nurse Health Advisor Martha Stanley, RN at [place] [facility]  °

## 2020-07-10 DIAGNOSIS — L438 Other lichen planus: Secondary | ICD-10-CM | POA: Diagnosis not present

## 2020-07-15 DIAGNOSIS — H02831 Dermatochalasis of right upper eyelid: Secondary | ICD-10-CM | POA: Diagnosis not present

## 2020-07-15 DIAGNOSIS — H02834 Dermatochalasis of left upper eyelid: Secondary | ICD-10-CM | POA: Diagnosis not present

## 2020-07-15 DIAGNOSIS — H52223 Regular astigmatism, bilateral: Secondary | ICD-10-CM | POA: Diagnosis not present

## 2020-07-15 DIAGNOSIS — H25813 Combined forms of age-related cataract, bilateral: Secondary | ICD-10-CM | POA: Diagnosis not present

## 2020-07-15 DIAGNOSIS — H353122 Nonexudative age-related macular degeneration, left eye, intermediate dry stage: Secondary | ICD-10-CM | POA: Diagnosis not present

## 2020-07-18 DIAGNOSIS — Z20822 Contact with and (suspected) exposure to covid-19: Secondary | ICD-10-CM | POA: Diagnosis not present

## 2020-07-21 NOTE — Progress Notes (Signed)
Subjective:   Tabitha Green is a 74 y.o. female who presents for Medicare Annual (Subsequent) preventive examination.  I connected with Tyarra today by telephone and verified that I am speaking with the correct person using two identifiers. Location patient: home Location provider: work Persons participating in the virtual visit: patient, Marine scientist.    I discussed the limitations, risks, security and privacy concerns of performing an evaluation and management service by telephone and the availability of in person appointments. I also discussed with the patient that there may be a patient responsible charge related to this service. The patient expressed understanding and verbally consented to this telephonic visit.    Interactive audio and video telecommunications were attempted between this provider and patient, however failed, due to patient having technical difficulties OR patient did not have access to video capability.  We continued and completed visit with audio only.  Some vital signs may be absent or patient reported.   Time Spent with patient on telephone encounter: 20 minutes    Review of Systems     Cardiac Risk Factors include: advanced age (>42men, >48 women);hypertension;dyslipidemia;obesity (BMI >30kg/m2);sedentary lifestyle     Objective:    Today's Vitals   07/22/20 1545  Weight: 244 lb (110.7 kg)  Height: 5\' 5"  (1.651 m)   Body mass index is 40.6 kg/m.  Advanced Directives 07/22/2020 06/17/2019 02/12/2019 06/11/2018 06/06/2017 03/15/2017 02/22/2017  Does Patient Have a Medical Advance Directive? Yes Yes Yes Yes No No No  Type of Paramedic of [place];Living will Living will;Healthcare Power of [place];Living will Living will;Healthcare Power of Attorney - - -  Copy of [place] in Chart? No - copy requested No - copy requested No - copy requested No - copy requested - - -  Would patient like  information on creating a medical advance directive? - - - - No - Patient declined - -  Pre-existing out of facility DNR order (yellow form or pink MOST form) - - - - - - -    Current Medications (verified) Outpatient Encounter Medications as of 07/22/2020  Medication Sig  . aspirin EC 81 MG EC tablet Take 1 tablet (81 mg total) by mouth daily.  Skipper Cliche SALINE NASAL DROPS NA Place 2 sprays into both nostrils daily.  . Camphor-Eucalyptus-Menthol (VICKS VAPORUB EX) Apply 1 application topically daily.  . Cyanocobalamin (B-12 PO) Take by mouth daily.  [name] doxazosin (CARDURA) 2 MG tablet TAKE 1 TABLET BY MOUTH  TWICE DAILY  . hydrochlorothiazide (HYDRODIURIL) 25 MG tablet TAKE 1 TABLET BY MOUTH  DAILY  . levothyroxine (SYNTHROID) 125 MCG tablet TAKE 1 TABLET BY MOUTH  DAILY  . LORazepam (ATIVAN) 0.5 MG tablet Take 1 tablet (0.5 mg total) by mouth 2 (two) times daily as needed for anxiety.  [name] losartan (COZAAR) 100 MG tablet Take 1 tablet (100 mg total) by mouth daily.  . Magnesium Oxide 500 MG TABS Take 1 tablet by mouth daily.  . mycophenolate (MYFORTIC) 360 MG TBEC EC tablet Take 360 mg by mouth 2 (two) times daily.  . SODIUM BICARBONATE PO Take 650 mg by mouth 2 (two) times daily. Takes 1 in AM and 1 at bedtime  . sulfamethoxazole-trimethoprim (BACTRIM) 400-80 MG tablet Take 1 tablet by mouth daily.  . tacrolimus (PROGRAF) 1 MG capsule Take 4 mg by mouth 2 (two) times daily. Pt takes 4 in the morning and 3 at night.  . triamcinolone (NASACORT) 55 MCG/ACT AERO  nasal inhaler Place 2 sprays into the nose daily.  [name] triamcinolone cream (KENALOG) 0.1 % Apply 1 application topically daily.  . verapamil (CALAN-SR) 120 MG CR tablet TAKE 1 TABLET BY MOUTH EVERY AFTERNOON  . verapamil (CALAN-SR) 240 MG CR tablet TAKE 1 TABLET BY MOUTH IN  THE MORNING  . meclizine (ANTIVERT) 25 MG tablet Take 1 tablet (25 mg total) by mouth 3 (three) times daily as needed for dizziness or nausea. (Patient not taking: No sig  reported)  . silver sulfADIAZINE (SILVADENE) 1 % cream Apply 1 application topically daily. Mixes with Triamcinolone cream (Patient not taking: No sig reported)   No facility-administered encounter medications on file as of 07/22/2020.    Allergies (verified) Cefaclor, Naproxen, Enalapril maleate, and Metoprolol tartrate   History: Past Medical History:  Diagnosis Date  . Anemia of chronic kidney failure    r/t kidney function- receives Procrit  . Anxiety   . Chronic renal insufficiency, stage III (moderate) (HCC) 10/2015   Post-transplant baseline sCr 1.2-1.3.    . CMV infection [facility]) summer 2017   Valcyte per ID/Renal transplant team  . Diverticulosis    a. 05/2012 colonoscopy  . Erosive lichen planus of vulva    Topical steroids (managed by Dr. Mila Palmer [facility] via [facility] outpt services.  . ESRD (end stage renal disease) ([place])    began dialysis 2014--followed by Dr. [name].  Received deceased donor kidney transplant 10/2015.  [name] FSGS (focal segmental glomerulosclerosis)    right kidney; hx of left renal cell cancer and got nephrectomy 1993.  [name] GERD (gastroesophageal reflux disease)   . Gout   . Heart murmur    (Diastolic) ECHO 09/3534 showed that this murmur is coming from pt's R arm AV fistula  . Hiatal hernia   . History of renal cell cancer 1993   Left nephrectomy  . Hyperlipidemia   . Hypertension   . Hypothyroidism   . Impingement syndrome of left shoulder 04/2016   Dr. Christy Sartorius to PT  . Lumbar spinal stenosis    Chronic LBP with bilat neurogenic claudication  . Metatarsal fracture, pathologic 01/2018   Right; orthocarolina-->post op shoe continued, f/u x-ray planned.  . Osteoarthritis of left knee 08/2017   severe, diffuse, tricompartmental.  Responded to steroid injection.    . Osteoporosis 06/07/2016   DEXA T-score of -3.1.  Fosamax planned 2017 but dental work prevented start..  Pathologic toe fx 12/2017--repeat DEXA 08/2018,  T-score -3.3 radius.  . Renal transplant recipient 11/19/2015   Baseline Cr as of 02/2018= 1.0-1.2.  . SVT (supraventricular tachycardia) ([facility])   . Vitamin B12 deficiency 12/2018   Starting replacement as of 12/11/2018   Past Surgical History:  Procedure Laterality Date  . AV FISTULA PLACEMENT Right    Right arm: aneurismal dilatation 2017 being followed by CV surgeons  . CARDIOVASCULAR STRESS TEST     03/10/15 ETT ([facility]): Exercise ECG negative at 83% max predicted HR.   [name] COLONOSCOPY  2003; 05/2012   diverticulosis, no polyps.  Recall 10 yrs  . colonoscopy with polypectomy     Dr Henrene Pastor  . DEXA  06/07/2016; 08/22/2018   2017 T-score -3.1.  2020 T score -3.3  . KIDNEY TRANSPLANT  November 19, 2015   Deceased donor kidney transplant, with Thymoglobulin induction  . LIGATION OF ARTERIOVENOUS  FISTULA Right 02/14/2017   Procedure: LIGATION OF ARTERIOVENOUS  FISTULA;  Surgeon: Angelia Mould, MD;  Location: [place];  Service: Vascular;  Laterality: Right;  .  NEPHRECTOMY  1993   for malignancy- left  . RENAL BIOPSY     right  . RESECTION OF ARTERIOVENOUS FISTULA ANEURYSM Right 02/14/2017   Procedure: EXCISION OF RIGHT BRACHIOCEPHALIC ARTERIOVENOUS FISTULA ANEURYSM;  Surgeon: Angelia Mould, MD;  Location: [place];  Service: Vascular;  Laterality: Right;  . TEE WITHOUT CARDIOVERSION N/A 08/27/2013   Procedure: TRANSESOPHAGEAL ECHOCARDIOGRAM (TEE);  Surgeon: [name], MD;  Location: [facility] ENDOSCOPY;  Service: Cardiovascular;  Laterality: N/A;  . TOTAL ABDOMINAL HYSTERECTOMY W/ BILATERAL SALPINGOOPHORECTOMY  1997   fibroids  . TRANSTHORACIC ECHOCARDIOGRAM     03/10/15 echo ([facility]): LV cavity normal in size, focal basal hypertrophy, normal systolic function, EF 08% (visual est). Normal wall motion, no regional wall motion abnormalities. Mild diastolic dysfunction with normal LA chamber size. No significant valve stenosis or regurgitation.  . VULVA / PERINEUM BIOPSY   2015   Family History  Problem Relation Age of Onset  . Deep vein thrombosis Mother        post thyroid surgery  . Hypertension Mother   . Heart attack Father 8       deceased  . Hypertension Father   . Cancer Sister        breast  . Cancer Paternal Aunt        pancreatic  . Diabetes Maternal Grandfather   . Stroke Paternal Grandmother        in 71s  . Fibroids Daughter   . Colon cancer Neg Hx   . Esophageal cancer Neg Hx   . Stomach cancer Neg Hx   . Rectal cancer Neg Hx    Social History   Socioeconomic History  . Marital status: Married    Spouse name: Not on file  . Number of children: 2  . Years of education: Not on file  . Highest education level: Not on file  Occupational History  . Occupation: Retired  Tobacco Use  . Smoking status: Never Smoker  . Smokeless tobacco: Never Used  Vaping Use  . Vaping Use: Never used  Substance and Sexual Activity  . Alcohol use: Yes    Comment: rarely  . Drug use: No  . Sexual activity: Not Currently    Birth control/protection: Surgical  Other Topics Concern  . Not on file  Social History Narrative   Lives in [place] with husband.  Retired.  Previously worked in [employer] @ [employer]   No T/A/Ds.   Social Determinants of Health   Financial Resource Strain: Low Risk   . Difficulty of Paying Living Expenses: Not hard at all  Food Insecurity: No Food Insecurity  . Worried About Charity fundraiser in the Last Year: Never true  . Ran Out of Food in the Last Year: Never true  Transportation Needs: No Transportation Needs  . Lack of Transportation (Medical): No  . Lack of Transportation (Non-Medical): No  Physical Activity: Inactive  . Days of Exercise per Week: 0 days  . Minutes of Exercise per Session: 0 min  Stress: No Stress Concern Present  . Feeling of Stress : Only a little  Social Connections: Moderately Integrated  . Frequency of Communication with Friends and Family: More than three times a week  .  Frequency of Social Gatherings with Friends and Family: More than three times a week  . Attends Religious Services: 1 to 4 times per year  . Active Member of Clubs or Organizations: No  . Attends Archivist Meetings:  Never  . Marital Status: Married    Tobacco Counseling Counseling given: Not Answered   Clinical Intake:  Pre-visit preparation completed: Yes  Pain : No/denies pain     Nutritional Status: BMI > 30  Obese Nutritional Risks: None Diabetes: No  How often do you need to have someone help you when you read instructions, pamphlets, or other written materials from your doctor or pharmacy?: 1 - Never What is the last grade level you completed in school?: 12th grade  Diabetic?No  Interpreter Needed?: No  Information entered by :: Caroleen Hamman LPN   Activities of Daily Living In your present state of health, do you have any difficulty performing the following activities: 07/22/2020  Hearing? N  Vision? N  Difficulty concentrating or making decisions? N  Walking or climbing stairs? N  Dressing or bathing? N  Doing errands, shopping? N  Preparing Food and eating ? N  Using the Toilet? N  In the past six months, have you accidently leaked urine? Y  Comment occasionally  Do you have problems with loss of bowel control? N  Managing your Medications? N  Managing your Finances? N  Housekeeping or managing your Housekeeping? N  Some recent data might be hidden    Patient Care Team: Tammi Sou, MD as PCP - General (Family Medicine) Irene Shipper, MD as Consulting Physician (Gastroenterology) Dene Gentry, MD as Consulting Physician (Sports Medicine) Elam Dutch, MD as Consulting Physician (Vascular Surgery) Angelia Mould, MD as Consulting Physician (Vascular Surgery) Florinda Marker as Physician Assistant (Orthopedic Surgery) [facility], Mila Palmer, MD as Consulting Physician (Dermatology) Santiago Glad, PA-C  as Consulting Physician (Orthopedic Surgery) Evans Lance, MD as Consulting Physician (Cardiology) Justin Mend, MD as Consulting Physician (Nephrology) Silverio Decamp, MD as Consulting Physician (Family Medicine)  Indicate any recent Medical Services you may have received from other than [facility] providers in the past year (date may be approximate).     Assessment:   This is a routine wellness examination for [place].  Hearing/Vision screen  Hearing Screening   125Hz  250Hz  500Hz  1000Hz  2000Hz  3000Hz  4000Hz  6000Hz  8000Hz   Right ear:           Left ear:           Comments: No issues  Vision Screening Comments: Reading glasses Last eye exam-07/2020-Dr. Haggaman  Dietary issues and exercise activities discussed: Current Exercise Habits: The patient does not participate in regular exercise at present, Exercise limited by: None identified  Goals    . Patient Stated     Increase activity.    . Patient Stated     Drink more water      Depression Screen PHQ 2/9 Scores 07/22/2020 06/17/2019 12/24/2018 06/11/2018 06/06/2017 04/18/2016 04/18/2016  PHQ - 2 Score 0 0 0 0 0 0 1  PHQ- 9 Score - - 0 - - - -    Fall Risk Fall Risk  07/22/2020 06/17/2019 12/24/2018 06/11/2018 06/06/2017  Falls in the past year? 0 0 0 0 No  Number falls in past yr: 0 0 0 - -  Injury with Fall? 0 0 0 - -  Follow up Falls prevention discussed Falls prevention discussed Falls evaluation completed - -    FALL RISK PREVENTION PERTAINING TO THE HOME:  Any stairs in or around the home? No  Home free of loose throw rugs in walkways, pet beds, electrical cords, etc? Yes  Adequate lighting in your home to reduce risk of  falls? Yes   ASSISTIVE DEVICES UTILIZED TO PREVENT FALLS:  Life alert? No  Use of a cane, walker or w/c? No  Grab bars in the bathroom? Yes  Shower chair or bench in shower? No  Elevated toilet seat or a handicapped toilet? No   TIMED UP AND GO:  Was the test performed? No . Phone  visit   Cognitive Function:Normal cognitive status assessed by this Nurse Health Advisor. No abnormalities found.   MMSE - Mini Mental State Exam 06/17/2019 06/11/2018 06/06/2017  Orientation to time 5 5 5   Orientation to Place 5 5 5   Registration 3 3 3   Attention/ Calculation 5 5 5   Recall 2 2 3   Language- name 2 objects 2 2 2   Language- repeat 1 1 1   Language- follow 3 step command 3 3 3   Language- read & follow direction 1 1 1   Write a sentence 1 1 1   Copy design 1 1 1   Total score 29 29 30         Immunizations Immunization History  Administered Date(s) Administered  . Fluad Quad(high Dose 65+) 05/21/2019  . Influenza Split 06/01/2011  . Influenza Whole 06/11/2008, 05/25/2009, 06/09/2010  . Influenza, High Dose Seasonal PF 05/08/2013, 06/06/2017, 05/08/2020  . Influenza-Unspecified 05/18/2015, 05/15/2018  . PFIZER SARS-COV-2 Vaccination 08/29/2019, 09/19/2019, 06/08/2020  . PPD Test 06/01/2011  . Pneumococcal Conjugate-13 06/06/2017  . Pneumococcal Polysaccharide-23 01/06/2013  . Tdap 09/13/2011  . Varicella 12/24/2013  . Zoster 01/23/2014    TDAP status: Up to date  Flu Vaccine status: Up to date  Pneumococcal vaccine status: Up to date  Covid-19 vaccine status: Completed vaccines  Qualifies for Shingles Vaccine? Yes   Zostavax completed Yes   Shingrix Completed?: No.    Education has been provided regarding the importance of this vaccine. Patient has been advised to call insurance company to determine out of pocket expense if they have not yet received this vaccine. Advised may also receive vaccine at local pharmacy or Health Dept. Verbalized acceptance and understanding.  Screening Tests Health Maintenance  Topic Date Due  . COVID-19 Vaccine (4 - Booster for Pfizer series) 12/06/2020  . TETANUS/TDAP  09/12/2021  . MAMMOGRAM  10/16/2021  . COLONOSCOPY  06/07/2022  . INFLUENZA VACCINE  Completed  . DEXA SCAN  Completed  . Hepatitis C Screening  Completed   . PNA vac Low Risk Adult  Completed    Health Maintenance  There are no preventive care reminders to display for this patient.  Colorectal cancer screening: Type of screening: Colonoscopy. Completed 06/07/2012. Repeat every 10 years  Mammogram status: Completed Bilateral 10/17/2019. Repeat every year  Bone Density status: Completed 08/22/2018. Results reflect: Bone density results: OSTEOPOROSIS. Repeat every 2 years.  Lung Cancer Screening: (Low Dose CT Chest recommended if Age 87-80 years, 30 pack-year currently smoking OR have quit w/in 15years.) does not qualify.     Additional Screening:  Hepatitis C Screening: Completed 04/18/2016  Vision Screening: Recommended annual ophthalmology exams for early detection of glaucoma and other disorders of the eye. Is the patient up to date with their annual eye exam?  Yes  Who is the provider or what is the name of the office in which the patient attends annual eye exams? Dr. Ahmed Prima  Dental Screening: Recommended annual dental exams for proper oral hygiene  Community Resource Referral / Chronic Care Management: CRR required this visit?  No   CCM required this visit?  No      Plan:  I have personally reviewed and noted the following in the patient's chart:   . Medical and social history . Use of alcohol, tobacco or illicit drugs  . Current medications and supplements . Functional ability and status . Nutritional status . Physical activity . Advanced directives . List of other physicians . Hospitalizations, surgeries, and ER visits in previous 12 months . Vitals . Screenings to include cognitive, depression, and falls . Referrals and appointments  In addition, I have reviewed and discussed with patient certain preventive protocols, quality metrics, and best practice recommendations. A written personalized care plan for preventive services as well as general preventive health recommendations were provided to patient.    Due to this being a telephonic visit, the after visit summary with patients personalized plan was offered to patient via mail or my-chart. Patient would like to access on my-chart.  Marta Antu, LPN   [id number]  Nurse Health Advisor  Nurse Notes: None

## 2020-07-22 ENCOUNTER — Ambulatory Visit (INDEPENDENT_AMBULATORY_CARE_PROVIDER_SITE_OTHER): Payer: Medicare Other

## 2020-07-22 VITALS — Ht 65.0 in | Wt 244.0 lb

## 2020-07-22 DIAGNOSIS — Z0000 Encounter for general adult medical examination without abnormal findings: Secondary | ICD-10-CM | POA: Diagnosis not present

## 2020-07-22 NOTE — Patient Instructions (Signed)
Tabitha Green , Thank you for taking time to complete your Medicare Wellness Visit. I appreciate your ongoing commitment to your health goals. Please review the following plan we discussed and let me know if I can assist you in the future.   Screening recommendations/referrals: Colonoscopy: Completed 06/07/2012-Not required after age 74 Mammogram: Completed 10/17/2019-Due 10/16/2020 Bone Density: Completed 08/22/2018-Due 08/22/2020 Recommended yearly ophthalmology/optometry visit for glaucoma screening and checkup Recommended yearly dental visit for hygiene and checkup  Vaccinations: Influenza vaccine: Up to date Pneumococcal vaccine: Completed vaccines Tdap vaccine: Up to date-Due-09/12/2021 Shingles vaccine: Discuss with pharmacy  Covid-19:Completed vaccines  Advanced directives: Please bring a copy for your chart  Conditions/risks identified: See problem list  Next appointment: Follow up in one year for your annual wellness visit 07/28/21 @ 9:00am   Preventive Care 65 Years and Older, Female Preventive care refers to lifestyle choices and visits with your health care provider that can promote health and wellness. What does preventive care include?  A yearly physical exam. This is also called an annual well check.  Dental exams once or twice a year.  Routine eye exams. Ask your health care provider how often you should have your eyes checked.  Personal lifestyle choices, including:  Daily care of your teeth and gums.  Regular physical activity.  Eating a healthy diet.  Avoiding tobacco and drug use.  Limiting alcohol use.  Practicing safe sex.  Taking low-dose aspirin every day.  Taking vitamin and mineral supplements as recommended by your health care provider. What happens during an annual well check? The services and screenings done by your health care provider during your annual well check will depend on your age, overall health, lifestyle risk factors, and family  history of disease. Counseling  Your health care provider may ask you questions about your:  Alcohol use.  Tobacco use.  Drug use.  Emotional well-being.  Home and relationship well-being.  Sexual activity.  Eating habits.  History of falls.  Memory and ability to understand (cognition).  Work and work Statistician.  Reproductive health. Screening  You may have the following tests or measurements:  Height, weight, and BMI.  Blood pressure.  Lipid and cholesterol levels. These may be checked every 5 years, or more frequently if you are over 56 years old.  Skin check.  Lung cancer screening. You may have this screening every year starting at age 59 if you have a 30-pack-year history of smoking and currently smoke or have quit within the past 15 years.  Fecal occult blood test (FOBT) of the stool. You may have this test every year starting at age 69.  Flexible sigmoidoscopy or colonoscopy. You may have a sigmoidoscopy every 5 years or a colonoscopy every 10 years starting at age 100.  Hepatitis C blood test.  Hepatitis B blood test.  Sexually transmitted disease (STD) testing.  Diabetes screening. This is done by checking your blood sugar (glucose) after you have not eaten for a while (fasting). You may have this done every 1-3 years.  Bone density scan. This is done to screen for osteoporosis. You may have this done starting at age 84.  Mammogram. This may be done every 1-2 years. Talk to your health care provider about how often you should have regular mammograms. Talk with your health care provider about your test results, treatment options, and if necessary, the need for more tests. Vaccines  Your health care provider may recommend certain vaccines, such as:  Influenza vaccine. This is recommended every  year.  Tetanus, diphtheria, and acellular pertussis (Tdap, Td) vaccine. You may need a Td booster every 10 years.  Zoster vaccine. You may need this after  age 48.  Pneumococcal 13-valent conjugate (PCV13) vaccine. One dose is recommended after age 19.  Pneumococcal polysaccharide (PPSV23) vaccine. One dose is recommended after age 6. Talk to your health care provider about which screenings and vaccines you need and how often you need them. This information is not intended to replace advice given to you by your health care provider. Make sure you discuss any questions you have with your health care provider. Document Released: 08/21/2015 Document Revised: 04/13/2016 Document Reviewed: 05/26/2015 Elsevier Interactive Patient Education  2017 [employer] Prevention in the Home Falls can cause injuries. They can happen to people of all ages. There are many things you can do to make your home safe and to help prevent falls. What can I do on the outside of my home?  Regularly fix the edges of walkways and driveways and fix any cracks.  Remove anything that might make you trip as you walk through a door, such as a raised step or threshold.  Trim any bushes or trees on the path to your home.  Use bright outdoor lighting.  Clear any walking paths of anything that might make someone trip, such as rocks or tools.  Regularly check to see if handrails are loose or broken. Make sure that both sides of any steps have handrails.  Any raised decks and porches should have guardrails on the edges.  Have any leaves, snow, or ice cleared regularly.  Use sand or salt on walking paths during winter.  Clean up any spills in your garage right away. This includes oil or grease spills. What can I do in the bathroom?  Use night lights.  Install grab bars by the toilet and in the tub and shower. Do not use towel bars as grab bars.  Use non-skid mats or decals in the tub or shower.  If you need to sit down in the shower, use a plastic, non-slip stool.  Keep the floor dry. Clean up any water that spills on the floor as soon as it  happens.  Remove soap buildup in the tub or shower regularly.  Attach bath mats securely with double-sided non-slip rug tape.  Do not have throw rugs and other things on the floor that can make you trip. What can I do in the bedroom?  Use night lights.  Make sure that you have a light by your bed that is easy to reach.  Do not use any sheets or blankets that are too big for your bed. They should not hang down onto the floor.  Have a firm chair that has side arms. You can use this for support while you get dressed.  Do not have throw rugs and other things on the floor that can make you trip. What can I do in the kitchen?  Clean up any spills right away.  Avoid walking on wet floors.  Keep items that you use a lot in easy-to-reach places.  If you need to reach something above you, use a strong step stool that has a grab bar.  Keep electrical cords out of the way.  Do not use floor polish or wax that makes floors slippery. If you must use wax, use non-skid floor wax.  Do not have throw rugs and other things on the floor that can make you trip. What can  I do with my stairs?  Do not leave any items on the stairs.  Make sure that there are handrails on both sides of the stairs and use them. Fix handrails that are broken or loose. Make sure that handrails are as long as the stairways.  Check any carpeting to make sure that it is firmly attached to the stairs. Fix any carpet that is loose or worn.  Avoid having throw rugs at the top or bottom of the stairs. If you do have throw rugs, attach them to the floor with carpet tape.  Make sure that you have a light switch at the top of the stairs and the bottom of the stairs. If you do not have them, ask someone to add them for you. What else can I do to help prevent falls?  Wear shoes that:  Do not have high heels.  Have rubber bottoms.  Are comfortable and fit you well.  Are closed at the toe. Do not wear sandals.  If you  use a stepladder:  Make sure that it is fully opened. Do not climb a closed stepladder.  Make sure that both sides of the stepladder are locked into place.  Ask someone to hold it for you, if possible.  Clearly mark and make sure that you can see:  Any grab bars or handrails.  First and last steps.  Where the edge of each step is.  Use tools that help you move around (mobility aids) if they are needed. These include:  Canes.  Walkers.  Scooters.  Crutches.  Turn on the lights when you go into a dark area. Replace any light bulbs as soon as they burn out.  Set up your furniture so you have a clear path. Avoid moving your furniture around.  If any of your floors are uneven, fix them.  If there are any pets around you, be aware of where they are.  Review your medicines with your doctor. Some medicines can make you feel dizzy. This can increase your chance of falling. Ask your doctor what other things that you can do to help prevent falls. This information is not intended to replace advice given to you by your health care provider. Make sure you discuss any questions you have with your health care provider. Document Released: 05/21/2009 Document Revised: 12/31/2015 Document Reviewed: 08/29/2014 Elsevier Interactive Patient Education  2017 [employer].

## 2020-08-03 ENCOUNTER — Telehealth (INDEPENDENT_AMBULATORY_CARE_PROVIDER_SITE_OTHER): Payer: Medicare Other | Admitting: Family Medicine

## 2020-08-03 ENCOUNTER — Encounter: Payer: Self-pay | Admitting: Family Medicine

## 2020-08-03 VITALS — Temp 97.9°F

## 2020-08-03 DIAGNOSIS — U071 COVID-19: Secondary | ICD-10-CM

## 2020-08-03 DIAGNOSIS — J069 Acute upper respiratory infection, unspecified: Secondary | ICD-10-CM

## 2020-08-03 DIAGNOSIS — Z940 Kidney transplant status: Secondary | ICD-10-CM | POA: Diagnosis not present

## 2020-08-03 DIAGNOSIS — Z20822 Contact with and (suspected) exposure to covid-19: Secondary | ICD-10-CM | POA: Diagnosis not present

## 2020-08-03 NOTE — Progress Notes (Signed)
Virtual Visit via Video Note  I connected with pt on 08/03/20 at  2:30 PM EST by a video enabled telemedicine application and verified that I am speaking with the correct person using two identifiers.  Location patient: home, [place] Location provider:work or home office Persons participating in the virtual visit: patient, provider  I discussed the limitations of evaluation and management by telemedicine and the availability of in person appointments. The patient expressed understanding and agreed to proceed.  Telemedicine visit is a necessity given the COVID-19 restrictions in place at the current time.  HPI: 74 y/o WF being seen today for cough. Onset of sx's about 5 d/a: cough, sinus/nasal congestion and pressure, dec appetite, dec energy, some malaise.  Tm 99.  No ST or HA.  No rattling in chest, no wheezing or SOB. "Sinus" sx's AND cough have improved significantly.   Tylenol used but no other meds needed.  Still taking all chronic rx meds. Says home covid test done today is positive.  Covid 19 vaccine status: pfizer x 3 Flu vaccine status: UTD.   ROS: See pertinent positives and negatives per HPI.  Past Medical History:  Diagnosis Date  . Anemia of chronic kidney failure    r/t kidney function- receives Procrit  . Anxiety   . Chronic renal insufficiency, stage III (moderate) (HCC) 10/2015   Post-transplant baseline sCr 1.2-1.3.    . CMV infection [facility]) summer 2017   Valcyte per ID/Renal transplant team  . Diverticulosis    a. 05/2012 colonoscopy  . Erosive lichen planus of vulva    Topical steroids (managed by Dr. Mila Palmer [facility] via [facility] out[facility].  . ESRD (end stage renal disease) ([place])    began dialysis 2014--followed by Dr. [name].  Received deceased donor kidney transplant 10/2015.  [name] FSGS (focal segmental glomerulosclerosis)    right kidney; hx of left renal cell cancer and got nephrectomy 1993.  [name] GERD (gastroesophageal reflux disease)    . Gout   . Heart murmur    (Diastolic) ECHO 12/8848 showed that this murmur is coming from pt's R arm AV fistula  . Hiatal hernia   . History of renal cell cancer 1993   Left nephrectomy  . Hyperlipidemia   . Hypertension   . Hypothyroidism   . Impingement syndrome of left shoulder 04/2016   Dr. Christy Sartorius to PT  . Lumbar spinal stenosis    Chronic LBP with bilat neurogenic claudication  . Metatarsal fracture, pathologic 01/2018   Right; orthocarolina-->post op shoe continued, f/u x-ray planned.  . Osteoarthritis of left knee 08/2017   severe, diffuse, tricompartmental.  Responded to steroid injection.    . Osteoporosis 06/07/2016   DEXA T-score of -3.1.  Fosamax planned 2017 but dental work prevented start..  Pathologic toe fx 12/2017--repeat DEXA 08/2018, T-score -3.3 radius.  . Renal transplant recipient 11/06/2015   Baseline Cr as of 02/2018= 1.0-1.2.  . SVT (supraventricular tachycardia) ([place])   . Vitamin B12 deficiency 12/2018   Starting replacement as of 12/11/2018    Past Surgical History:  Procedure Laterality Date  . AV FISTULA PLACEMENT Right    Right arm: aneurismal dilatation 2017 being followed by CV surgeons  . CARDIOVASCULAR STRESS TEST     03/10/15 ETT ([facility]): Exercise ECG negative at 83% max predicted HR.   [name] COLONOSCOPY  2003; 05/2012   diverticulosis, no polyps.  Recall 10 yrs  . colonoscopy with polypectomy     Dr Henrene Pastor  . DEXA  06/07/2016;  08/22/2018   2017 T-score -3.1.  2020 T score -3.3  . KIDNEY TRANSPLANT  11/19/2015   Deceased donor kidney transplant, with Thymoglobulin induction  . LIGATION OF ARTERIOVENOUS  FISTULA Right 02/14/2017   Procedure: LIGATION OF ARTERIOVENOUS  FISTULA;  Surgeon: Angelia Mould, MD;  Location: [place];  Service: Vascular;  Laterality: Right;  . NEPHRECTOMY  1993   for malignancy- left  . RENAL BIOPSY     right  . RESECTION OF ARTERIOVENOUS FISTULA ANEURYSM Right 02/14/2017   Procedure: EXCISION OF RIGHT  BRACHIOCEPHALIC ARTERIOVENOUS FISTULA ANEURYSM;  Surgeon: Angelia Mould, MD;  Location: [facility];  Service: Vascular;  Laterality: Right;  . TEE WITHOUT CARDIOVERSION N/A 08/27/2013   Procedure: TRANSESOPHAGEAL ECHOCARDIOGRAM (TEE);  Surgeon: [name], MD;  Location: [facility] ENDOSCOPY;  Service: Cardiovascular;  Laterality: N/A;  . TOTAL ABDOMINAL HYSTERECTOMY W/ BILATERAL SALPINGOOPHORECTOMY  1997   fibroids  . TRANSTHORACIC ECHOCARDIOGRAM     03/10/15 echo ([facility]): LV cavity normal in size, focal basal hypertrophy, normal systolic function, EF 13% (visual est). Normal wall motion, no regional wall motion abnormalities. Mild diastolic dysfunction with normal LA chamber size. No significant valve stenosis or regurgitation.  . VULVA / PERINEUM BIOPSY  2015     Current Outpatient Medications:  .  aspirin EC 81 MG EC tablet, Take 1 tablet (81 mg total) by mouth daily., Disp: , Rfl:  .  AYR SALINE NASAL DROPS NA, Place 2 sprays into both nostrils daily., Disp: , Rfl:  .  Camphor-Eucalyptus-Menthol (VICKS VAPORUB EX), Apply 1 application topically daily., Disp: , Rfl:  .  Cyanocobalamin (B-12 PO), Take by mouth daily., Disp: , Rfl:  .  doxazosin (CARDURA) 2 MG tablet, TAKE 1 TABLET BY MOUTH  TWICE DAILY, Disp: 180 tablet, Rfl: 1 .  hydrochlorothiazide (HYDRODIURIL) 25 MG tablet, TAKE 1 TABLET BY MOUTH  DAILY, Disp: 90 tablet, Rfl: 1 .  levothyroxine (SYNTHROID) 125 MCG tablet, TAKE 1 TABLET BY MOUTH  DAILY, Disp: 90 tablet, Rfl: 1 .  LORazepam (ATIVAN) 0.5 MG tablet, Take 1 tablet (0.5 mg total) by mouth 2 (two) times daily as needed for anxiety., Disp: 30 tablet, Rfl: 1 .  losartan (COZAAR) 100 MG tablet, Take 1 tablet (100 mg total) by mouth daily., Disp: 90 tablet, Rfl: 1 .  Magnesium Oxide 500 MG TABS, Take 1 tablet by mouth daily., Disp: , Rfl:  .  meclizine (ANTIVERT) 25 MG tablet, Take 1 tablet (25 mg total) by mouth 3 (three) times daily as needed for dizziness  or nausea. (Patient not taking: No sig reported), Disp: 20 tablet, Rfl: 0 .  mycophenolate (MYFORTIC) 360 MG TBEC EC tablet, Take 360 mg by mouth 2 (two) times daily., Disp: , Rfl:  .  silver sulfADIAZINE (SILVADENE) 1 % cream, Apply 1 application topically daily. Mixes with Triamcinolone cream (Patient not taking: No sig reported), Disp: , Rfl:  .  SODIUM BICARBONATE PO, Take 650 mg by mouth 2 (two) times daily. Takes 1 in AM and 1 at bedtime, Disp: , Rfl:  .  sulfamethoxazole-trimethoprim (BACTRIM) 400-80 MG tablet, Take 1 tablet by mouth daily., Disp: , Rfl:  .  tacrolimus (PROGRAF) 1 MG capsule, Take 4 mg by mouth 2 (two) times daily. Pt takes 4 in the morning and 3 at night., Disp: , Rfl:  .  triamcinolone (NASACORT) 55 MCG/ACT AERO nasal inhaler, Place 2 sprays into the nose daily., Disp: , Rfl:  .  triamcinolone cream (KENALOG) 0.1 %,  Apply 1 application topically daily., Disp: , Rfl:  .  verapamil (CALAN-SR) 120 MG CR tablet, TAKE 1 TABLET BY MOUTH EVERY AFTERNOON, Disp: 90 tablet, Rfl: 0 .  verapamil (CALAN-SR) 240 MG CR tablet, TAKE 1 TABLET BY MOUTH IN  THE MORNING, Disp: 90 tablet, Rfl: 1  EXAM:  VITALS per patient if applicable:  Vitals with BMI 07/22/2020 03/10/2020 01/22/2020  Height 5\' 5"  5\' 5"  5\' 5"   Weight 244 lbs 244 lbs 6 oz 243 lbs 8 oz  BMI 40.6 42.35 36.14  Systolic - 431 540  Diastolic - 72 83  Pulse - 77 78     GENERAL: alert, oriented, appears well and in no acute distress  HEENT: atraumatic, conjunttiva clear, no obvious abnormalities on inspection of external nose and ears  NECK: normal movements of the head and neck  LUNGS: on inspection no signs of respiratory distress, breathing rate appears normal, no obvious gross SOB, gasping or wheezing  CV: no obvious cyanosis  MS: moves all visible extremities without noticeable abnormality  PSYCH/NEURO: pleasant and cooperative, no obvious depression or anxiety, speech and thought processing grossly  intact  LABS: none today    Chemistry      Component Value Date/Time   NA 138 03/03/2020 0000   K 4.2 03/03/2020 0000   CL 103 03/03/2020 0000   CO2 20 03/03/2020 0000   BUN 24 (A) 03/03/2020 0000   CREATININE 1.3 (A) 03/03/2020 0000   CREATININE 1.22 (H) 03/28/2019 0952   CREATININE 3.61 (H) 09/16/2015 1515   GLU 120 03/03/2020 0000      Component Value Date/Time   CALCIUM 9.6 03/03/2020 0000   CALCIUM 8.4 08/30/2012 0819   ALKPHOS 87 03/02/2020 0000   AST 16 03/02/2020 0000   ALT 13 03/02/2020 0000   BILITOT 0.6 02/18/2019 0948     Lab Results  Component Value Date   WBC 7.0 03/03/2020   HGB 13.1 03/03/2020   HCT 39 03/03/2020   MCV 92.8 02/18/2019   PLT 287 03/03/2020   Lab Results  Component Value Date   HGBA1C 5.4 02/23/2012   Lab Results  Component Value Date   TSH 0.32 (L) 03/10/2020    ASSESSMENT AND PLAN:  Discussed the following assessment and plan:  Viral URI with cough: resolving. Covid test at home POSITIVE today. She is covid-vaccinated, including booster. Will give her the contact info for infusion clinic to see if she would be a candidate for monoclonal ab infusion for covid. Signs/symptoms to call or return for were reviewed and pt expressed understanding. Quarantine minimum of 10d from onset of illness.   I discussed the assessment and treatment plan with the patient. The patient was provided an opportunity to ask questions and all were answered. The patient agreed with the plan and demonstrated an understanding of the instructions.   F/u: as needed  Signed:  Crissie Sickles, MD           08/03/2020

## 2020-08-05 DIAGNOSIS — Z940 Kidney transplant status: Secondary | ICD-10-CM | POA: Diagnosis not present

## 2020-08-05 DIAGNOSIS — Z20822 Contact with and (suspected) exposure to covid-19: Secondary | ICD-10-CM | POA: Diagnosis not present

## 2020-08-05 DIAGNOSIS — U071 COVID-19: Secondary | ICD-10-CM | POA: Diagnosis not present

## 2020-08-06 DIAGNOSIS — U071 COVID-19: Secondary | ICD-10-CM | POA: Insufficient documentation

## 2020-08-07 DIAGNOSIS — Z23 Encounter for immunization: Secondary | ICD-10-CM | POA: Diagnosis not present

## 2020-08-07 DIAGNOSIS — U071 COVID-19: Secondary | ICD-10-CM | POA: Diagnosis not present

## 2020-08-10 DIAGNOSIS — U071 COVID-19: Secondary | ICD-10-CM | POA: Diagnosis not present

## 2020-08-10 DIAGNOSIS — Z9189 Other specified personal risk factors, not elsewhere classified: Secondary | ICD-10-CM | POA: Diagnosis not present

## 2020-08-11 DIAGNOSIS — U071 COVID-19: Secondary | ICD-10-CM | POA: Diagnosis not present

## 2020-08-11 DIAGNOSIS — U099 Post covid-19 condition, unspecified: Secondary | ICD-10-CM | POA: Diagnosis not present

## 2020-08-11 DIAGNOSIS — Z79899 Other long term (current) drug therapy: Secondary | ICD-10-CM | POA: Diagnosis not present

## 2020-08-11 DIAGNOSIS — R0602 Shortness of breath: Secondary | ICD-10-CM | POA: Diagnosis not present

## 2020-08-11 DIAGNOSIS — J1282 Pneumonia due to coronavirus disease 2019: Secondary | ICD-10-CM | POA: Diagnosis not present

## 2020-08-11 DIAGNOSIS — Z1159 Encounter for screening for other viral diseases: Secondary | ICD-10-CM | POA: Diagnosis not present

## 2020-08-13 DIAGNOSIS — Z9189 Other specified personal risk factors, not elsewhere classified: Secondary | ICD-10-CM | POA: Diagnosis not present

## 2020-08-13 DIAGNOSIS — U071 COVID-19: Secondary | ICD-10-CM | POA: Diagnosis not present

## 2020-08-21 ENCOUNTER — Other Ambulatory Visit: Payer: Self-pay | Admitting: Family Medicine

## 2020-09-04 DIAGNOSIS — T8610 Unspecified complication of kidney transplant: Secondary | ICD-10-CM | POA: Diagnosis not present

## 2020-09-04 DIAGNOSIS — Z79899 Other long term (current) drug therapy: Secondary | ICD-10-CM | POA: Diagnosis not present

## 2020-09-04 DIAGNOSIS — Z940 Kidney transplant status: Secondary | ICD-10-CM | POA: Diagnosis not present

## 2020-09-04 LAB — BASIC METABOLIC PANEL
BUN: 21 (ref 4–21)
Chloride: 104 (ref 99–108)
Creatinine: 1.4 — AB (ref 0.5–1.1)
Glucose: 118
Potassium: 4.2 (ref 3.4–5.3)
Sodium: 140 (ref 137–147)

## 2020-09-04 LAB — CBC AND DIFFERENTIAL
Hemoglobin: 12.7 (ref 12.0–16.0)
Platelets: 299 (ref 150–399)
WBC: 7.7

## 2020-09-04 LAB — COMPREHENSIVE METABOLIC PANEL
Albumin: 3.7 (ref 3.5–5.0)
Calcium: 9.6 (ref 8.7–10.7)
GFR calc Af Amer: 42
GFR calc non Af Amer: 37

## 2020-09-07 ENCOUNTER — Encounter: Payer: Self-pay | Admitting: Family Medicine

## 2020-09-08 ENCOUNTER — Ambulatory Visit (INDEPENDENT_AMBULATORY_CARE_PROVIDER_SITE_OTHER): Payer: Medicare Other | Admitting: Family Medicine

## 2020-09-08 ENCOUNTER — Encounter: Payer: Self-pay | Admitting: Family Medicine

## 2020-09-08 ENCOUNTER — Other Ambulatory Visit: Payer: Self-pay

## 2020-09-08 VITALS — BP 136/80 | HR 85 | Temp 97.7°F | Resp 16 | Ht 65.0 in | Wt 234.8 lb

## 2020-09-08 DIAGNOSIS — E538 Deficiency of other specified B group vitamins: Secondary | ICD-10-CM

## 2020-09-08 DIAGNOSIS — Z940 Kidney transplant status: Secondary | ICD-10-CM | POA: Diagnosis not present

## 2020-09-08 DIAGNOSIS — I10 Essential (primary) hypertension: Secondary | ICD-10-CM

## 2020-09-08 DIAGNOSIS — T887XXA Unspecified adverse effect of drug or medicament, initial encounter: Secondary | ICD-10-CM

## 2020-09-08 DIAGNOSIS — F411 Generalized anxiety disorder: Secondary | ICD-10-CM

## 2020-09-08 DIAGNOSIS — E039 Hypothyroidism, unspecified: Secondary | ICD-10-CM

## 2020-09-08 DIAGNOSIS — R251 Tremor, unspecified: Secondary | ICD-10-CM

## 2020-09-08 DIAGNOSIS — N183 Chronic kidney disease, stage 3 unspecified: Secondary | ICD-10-CM | POA: Diagnosis not present

## 2020-09-08 DIAGNOSIS — Z8616 Personal history of COVID-19: Secondary | ICD-10-CM | POA: Diagnosis not present

## 2020-09-08 LAB — VITAMIN B12: Vitamin B-12: 355 pg/mL (ref 211–911)

## 2020-09-08 LAB — TSH: TSH: 0.16 u[IU]/mL — ABNORMAL LOW (ref 0.35–4.50)

## 2020-09-08 NOTE — Progress Notes (Signed)
OFFICE VISIT  09/08/2020  CC:  Chief Complaint  Patient presents with  . Hands shaking    Occurring more in the last 2 weeks off and on.   HPI:    Patient is a 75 y.o. Caucasian female with CRI III and hx of renal transplant who presents for "hands shaking". Has long hx of mild tremulousness since being on tacrolimus and mycophenilate, says in both arms/hands the same.  Comes and goes.    Had covid infection around the end of Dec 2021.  She ended up getting monoclonal antibody infusion and abx. Still with some residual cough and decreased energy level.  Chronic anxiety: fairly stable.  Mood is not depressed. Still taking 0.5mg  loraz, usually 1/2 tab at at time, about 3-4 times per month but maybe a bit more last 1 mo or so.  PMP AWARE reviewed today: most recent rx for lorazepam 0.5mg  was filled 09/25/19, # 30, rx by me. No red flags.  Vit B12 def: takes 500 mcg but historically only occasionally but more lately since she is concerned that low b12 may be causing her tremors.  ROS: no fevers, no CP, no SOB, no wheezing,no dizziness, no HAs, no rashes, no melena/hematochezia.  No polyuria or polydipsia.  No myalgias or arthralgias.  No focal weakness or paresthesias. No acute vision or hearing abnormalities. No n/v/d or abd pain.  No palpitations.    Past Medical History:  Diagnosis Date  . Anemia of chronic kidney failure    r/t kidney function- receives Procrit  . Anxiety   . Chronic renal insufficiency, stage III (moderate) (HCC) 10/2015   Post-transplant baseline sCr 1.2-1.3.    . CMV infection [facility]) summer 2017   Valcyte per ID/Renal transplant team  . Diverticulosis    a. 05/2012 colonoscopy  . Erosive lichen planus of vulva    Topical steroids (managed by Dr. Mila [facility].  . ESRD (end stage renal disease) ([place])    began dialysis 2014--followed by Dr. [name].  Received deceased donor kidney transplant 10/2015.  [name] FSGS  (focal segmental glomerulosclerosis)    right kidney; hx of left renal cell cancer and got nephrectomy 1993.  [name] GERD (gastroesophageal reflux disease)   . Gout   . Heart murmur    (Diastolic) ECHO 10/5327 showed that this murmur is coming from pt's R arm AV fistula  . Hiatal hernia   . History of renal cell cancer 1993   Left nephrectomy  . Hyperlipidemia   . Hypertension   . Hypothyroidism   . Impingement syndrome of left shoulder 04/2016   Dr. Christy Sartorius to PT  . Lumbar spinal stenosis    Chronic LBP with bilat neurogenic claudication  . Metatarsal fracture, pathologic 01/2018   Right; orthocarolina-->post op shoe continued, f/u x-ray planned.  . Osteoarthritis of left knee 08/2017   severe, diffuse, tricompartmental.  Responded to steroid injection.    . Osteoporosis 06/07/2016   DEXA T-score of -3.1.  Fosamax planned 2017 but dental work prevented start..  Pathologic toe fx 12/2017--repeat DEXA 08/2018, T-score -3.3 radius.  . Renal transplant recipient 11/06/2015   Baseline Cr as of 02/2018= 1.0-1.2.  . SVT (supraventricular tachycardia) ([place])   . Vitamin B12 deficiency 12/2018   Starting replacement as of 12/11/2018    Past Surgical History:  Procedure Laterality Date  . AV FISTULA PLACEMENT Right    Right arm: aneurismal dilatation 2017 being followed by CV surgeons  . CARDIOVASCULAR STRESS TEST  03/10/15 ETT ([facility]): Exercise ECG negative at 83% max predicted HR.   [name] COLONOSCOPY  2003; 05/2012   diverticulosis, no polyps.  Recall 10 yrs  . colonoscopy with polypectomy     Dr Henrene Pastor  . DEXA  06/07/2016; 08/22/2018   2017 T-score -3.1.  2020 T score -3.3  . KIDNEY TRANSPLANT  12/06/2015   Deceased donor kidney transplant, with Thymoglobulin induction  . LIGATION OF ARTERIOVENOUS  FISTULA Right 02/14/2017   Procedure: LIGATION OF ARTERIOVENOUS  FISTULA;  Surgeon: Angelia Mould, MD;  Location: [place];  Service: Vascular;  Laterality: Right;  . NEPHRECTOMY   1993   for malignancy- left  . RENAL BIOPSY     right  . RESECTION OF ARTERIOVENOUS FISTULA ANEURYSM Right 02/14/2017   Procedure: EXCISION OF RIGHT BRACHIOCEPHALIC ARTERIOVENOUS FISTULA ANEURYSM;  Surgeon: Angelia Mould, MD;  Location: [place];  Service: Vascular;  Laterality: Right;  . TEE WITHOUT CARDIOVERSION N/A 08/27/2013   Procedure: TRANSESOPHAGEAL ECHOCARDIOGRAM (TEE);  Surgeon: [name], MD;  Location: [facility] ENDOSCOPY;  Service: Cardiovascular;  Laterality: N/A;  . TOTAL ABDOMINAL HYSTERECTOMY W/ BILATERAL SALPINGOOPHORECTOMY  1997   fibroids  . TRANSTHORACIC ECHOCARDIOGRAM     03/10/15 echo ([facility]): LV cavity normal in size, focal basal hypertrophy, normal systolic function, EF 25% (visual est). Normal wall motion, no regional wall motion abnormalities. Mild diastolic dysfunction with normal LA chamber size. No significant valve stenosis or regurgitation.  . VULVA / PERINEUM BIOPSY  2015    Outpatient Medications Prior to Visit  Medication Sig Dispense Refill  . aspirin EC 81 MG EC tablet Take 1 tablet (81 mg total) by mouth daily.    Skipper Cliche SALINE NASAL DROPS NA Place 2 sprays into both nostrils daily.    . Camphor-Eucalyptus-Menthol (VICKS VAPORUB EX) Apply 1 application topically daily.    . Cyanocobalamin (B-12 PO) Take by mouth daily.    [name] doxazosin (CARDURA) 2 MG tablet TAKE 1 TABLET BY MOUTH  TWICE DAILY 180 tablet 1  . hydrochlorothiazide (HYDRODIURIL) 25 MG tablet TAKE 1 TABLET BY MOUTH  DAILY 90 tablet 3  . levothyroxine (SYNTHROID) 125 MCG tablet TAKE 1 TABLET BY MOUTH  DAILY 90 tablet 3  . LORazepam (ATIVAN) 0.5 MG tablet Take 1 tablet (0.5 mg total) by mouth 2 (two) times daily as needed for anxiety. 30 tablet 1  . losartan (COZAAR) 100 MG tablet Take 1 tablet (100 mg total) by mouth daily. 90 tablet 1  . Magnesium Oxide 500 MG TABS Take 1 tablet by mouth daily.    . mycophenolate (MYFORTIC) 360 MG TBEC EC tablet Take 360 mg by mouth 2  (two) times daily.    . silver sulfADIAZINE (SILVADENE) 1 % cream Apply 1 application topically daily. Mixes with Triamcinolone cream    . SODIUM BICARBONATE PO Take 650 mg by mouth 2 (two) times daily. Takes 1 in AM and 1 at bedtime    . sulfamethoxazole-trimethoprim (BACTRIM) 400-80 MG tablet Take 1 tablet by mouth daily.    . tacrolimus (PROGRAF) 1 MG capsule Take 4 mg by mouth 2 (two) times daily. Pt takes 4 in the morning and 3 at night.    . triamcinolone (NASACORT) 55 MCG/ACT AERO nasal inhaler Place 2 sprays into the nose daily.    [name] triamcinolone cream (KENALOG) 0.1 % Apply 1 application topically daily.    . verapamil (CALAN-SR) 120 MG CR tablet TAKE 1 TABLET BY MOUTH  EVERY AFTERNOON 90  tablet 3  . verapamil (CALAN-SR) 240 MG CR tablet TAKE 1 TABLET BY MOUTH IN  THE MORNING 90 tablet 1  . meclizine (ANTIVERT) 25 MG tablet Take 1 tablet (25 mg total) by mouth 3 (three) times daily as needed for dizziness or nausea. (Patient not taking: No sig reported) 20 tablet 0   No facility-administered medications prior to visit.    Allergies  Allergen Reactions  . Cefaclor Rash  . Naproxen Rash  . Enalapril Maleate Cough    Vasotec  . Metoprolol Tartrate Rash    On legs    ROS As per HPI  PE: Vitals with BMI 09/08/2020 07/22/2020 03/10/2020  Height 5\' 5"  5\' 5"  5\' 5"   Weight 234 lbs 13 oz 244 lbs 244 lbs 6 oz  BMI 39.07 51.8 84.16  Systolic 606 - 301  Diastolic 80 - 72  Pulse 85 - 77     Gen: Alert, well appearing.  Patient is oriented to person, place, time, and situation. AFFECT: pleasant, lucid thought and speech. CV: RRR, no m/r/g.   LUNGS: CTA bilat, nonlabored resps, good aeration in all lung fields. EXT: no clubbing or cyanosis.  no edema.  Neuro: CN 2-12 intact bilaterally, strength 5/5 in proximal and distal upper extremities and lower extremities bilaterally.  No sensory deficits.  Mild bilat hands tremor noted when arms held outstretched.   No ataxia.   No pronator  drift.    LABS:    Chemistry      Component Value Date/Time   NA 138 03/03/2020 0000   K 4.2 03/03/2020 0000   CL 103 03/03/2020 0000   CO2 20 03/03/2020 0000   BUN 24 (A) 03/03/2020 0000   CREATININE 1.3 (A) 03/03/2020 0000   CREATININE 1.22 (H) 03/28/2019 0952   CREATININE 3.61 (H) 09/16/2015 1515   GLU 120 03/03/2020 0000      Component Value Date/Time   CALCIUM 9.6 03/03/2020 0000   CALCIUM 8.4 08/30/2012 0819   ALKPHOS 87 03/02/2020 0000   AST 16 03/02/2020 0000   ALT 13 03/02/2020 0000   BILITOT 0.6 02/18/2019 0948     Lab Results  Component Value Date   WBC 7.0 03/03/2020   HGB 13.1 03/03/2020   HCT 39 03/03/2020   MCV 92.8 02/18/2019   PLT 287 03/03/2020   Lab Results  Component Value Date   VITAMINB12 701 05/21/2019   Lab Results  Component Value Date   HGBA1C 5.4 02/23/2012   Lab Results  Component Value Date   TSH 0.32 (L) 03/10/2020   IMPRESSION AND PLAN:  1) Tremors, hands, symmetric: side effect of her mycophenolate and/or tacrolimus, slightly worsened since acute covid infection, expect this to gradually return to baseline.  Reassured pt today.    2) Anxiety: stable/chronic.  She does ok with infrequent prn use of lorazepam. No new rx needed for this med today.  3) CRI IIIb: stable.  Reviewed nephrol MD labs from 09/04/20 on pt's phone today: sCr 1.4 (GFR 37).  Tacrolimus level 6.6. CBC normal, lytes normal, liver panel normal.  4) HTN: stable. Recent lytes 09/04/20 normal, renal fxn stable. Cont losartan 100 qd, cardura 2mg  bid, hctz 25mg  qd, cerapamil as in meds list above.  5) Hypothyroidism: TSH monitoring today.  6) Hx of covid infection: acute infection was about 5-6 wks ago, recovering fine---some lingering mild cough (I heard no coughing today) and fatigue + tremors.    7) Vit B12 def: dx'd 12/2018 and level rose approp with  oral b12 replacement but then she stopped taking supplement much at all. Recheck level today.  An After  Visit Summary was printed and given to the patient.  FOLLOW UP: Return in about 6 months (around 03/08/2021) for annual CPE (fasting) + RCI.  Signed:  Crissie Sickles, MD           09/08/2020

## 2020-09-09 ENCOUNTER — Other Ambulatory Visit: Payer: Self-pay

## 2020-09-09 DIAGNOSIS — E039 Hypothyroidism, unspecified: Secondary | ICD-10-CM

## 2020-09-30 DIAGNOSIS — Z792 Long term (current) use of antibiotics: Secondary | ICD-10-CM | POA: Diagnosis not present

## 2020-09-30 DIAGNOSIS — Z940 Kidney transplant status: Secondary | ICD-10-CM | POA: Diagnosis not present

## 2020-09-30 DIAGNOSIS — I129 Hypertensive chronic kidney disease with stage 1 through stage 4 chronic kidney disease, or unspecified chronic kidney disease: Secondary | ICD-10-CM | POA: Diagnosis not present

## 2020-09-30 DIAGNOSIS — B259 Cytomegaloviral disease, unspecified: Secondary | ICD-10-CM | POA: Diagnosis not present

## 2020-09-30 DIAGNOSIS — D631 Anemia in chronic kidney disease: Secondary | ICD-10-CM | POA: Diagnosis not present

## 2020-09-30 DIAGNOSIS — M109 Gout, unspecified: Secondary | ICD-10-CM | POA: Diagnosis not present

## 2020-09-30 DIAGNOSIS — N189 Chronic kidney disease, unspecified: Secondary | ICD-10-CM | POA: Diagnosis not present

## 2020-09-30 DIAGNOSIS — E872 Acidosis: Secondary | ICD-10-CM | POA: Diagnosis not present

## 2020-09-30 DIAGNOSIS — N250 Renal osteodystrophy: Secondary | ICD-10-CM | POA: Diagnosis not present

## 2020-09-30 DIAGNOSIS — E559 Vitamin D deficiency, unspecified: Secondary | ICD-10-CM | POA: Diagnosis not present

## 2020-09-30 DIAGNOSIS — Z79899 Other long term (current) drug therapy: Secondary | ICD-10-CM | POA: Diagnosis not present

## 2020-09-30 DIAGNOSIS — I10 Essential (primary) hypertension: Secondary | ICD-10-CM | POA: Diagnosis not present

## 2020-09-30 LAB — BASIC METABOLIC PANEL
BUN: 24 — AB (ref 4–21)
CO2: 27 — AB (ref 13–22)
Chloride: 105 (ref 99–108)
Creatinine: 1.3 — AB (ref 0.5–1.1)
Glucose: 102
Potassium: 4.5 (ref 3.4–5.3)
Sodium: 139 (ref 137–147)

## 2020-09-30 LAB — COMPREHENSIVE METABOLIC PANEL
Albumin: 3.6 (ref 3.5–5.0)
Calcium: 9.4 (ref 8.7–10.7)

## 2020-09-30 LAB — CBC AND DIFFERENTIAL
Hemoglobin: 12.5 (ref 12.0–16.0)
Platelets: 280 (ref 150–399)
WBC: 5.9

## 2020-10-05 ENCOUNTER — Other Ambulatory Visit: Payer: Self-pay | Admitting: Family Medicine

## 2020-10-06 DIAGNOSIS — H25813 Combined forms of age-related cataract, bilateral: Secondary | ICD-10-CM | POA: Diagnosis not present

## 2020-10-06 DIAGNOSIS — H52203 Unspecified astigmatism, bilateral: Secondary | ICD-10-CM | POA: Diagnosis not present

## 2020-10-13 DIAGNOSIS — Z79899 Other long term (current) drug therapy: Secondary | ICD-10-CM | POA: Diagnosis not present

## 2020-10-13 DIAGNOSIS — H02831 Dermatochalasis of right upper eyelid: Secondary | ICD-10-CM | POA: Diagnosis not present

## 2020-10-13 DIAGNOSIS — I10 Essential (primary) hypertension: Secondary | ICD-10-CM | POA: Diagnosis not present

## 2020-10-13 DIAGNOSIS — H02834 Dermatochalasis of left upper eyelid: Secondary | ICD-10-CM | POA: Diagnosis not present

## 2020-10-13 DIAGNOSIS — H52209 Unspecified astigmatism, unspecified eye: Secondary | ICD-10-CM | POA: Diagnosis not present

## 2020-10-13 DIAGNOSIS — H25812 Combined forms of age-related cataract, left eye: Secondary | ICD-10-CM | POA: Diagnosis not present

## 2020-10-14 DIAGNOSIS — Z961 Presence of intraocular lens: Secondary | ICD-10-CM | POA: Diagnosis not present

## 2020-10-14 DIAGNOSIS — H25811 Combined forms of age-related cataract, right eye: Secondary | ICD-10-CM | POA: Diagnosis not present

## 2020-10-14 DIAGNOSIS — H52203 Unspecified astigmatism, bilateral: Secondary | ICD-10-CM | POA: Diagnosis not present

## 2020-10-15 ENCOUNTER — Encounter: Payer: Self-pay | Admitting: Family Medicine

## 2020-10-20 DIAGNOSIS — I10 Essential (primary) hypertension: Secondary | ICD-10-CM | POA: Diagnosis not present

## 2020-10-20 DIAGNOSIS — E079 Disorder of thyroid, unspecified: Secondary | ICD-10-CM | POA: Diagnosis not present

## 2020-10-20 DIAGNOSIS — Z881 Allergy status to other antibiotic agents status: Secondary | ICD-10-CM | POA: Diagnosis not present

## 2020-10-20 DIAGNOSIS — H02831 Dermatochalasis of right upper eyelid: Secondary | ICD-10-CM | POA: Diagnosis not present

## 2020-10-20 DIAGNOSIS — Z886 Allergy status to analgesic agent status: Secondary | ICD-10-CM | POA: Diagnosis not present

## 2020-10-20 DIAGNOSIS — H25811 Combined forms of age-related cataract, right eye: Secondary | ICD-10-CM | POA: Insufficient documentation

## 2020-10-20 DIAGNOSIS — Z888 Allergy status to other drugs, medicaments and biological substances status: Secondary | ICD-10-CM | POA: Diagnosis not present

## 2020-10-20 DIAGNOSIS — H25812 Combined forms of age-related cataract, left eye: Secondary | ICD-10-CM | POA: Diagnosis not present

## 2020-10-20 DIAGNOSIS — Z85528 Personal history of other malignant neoplasm of kidney: Secondary | ICD-10-CM | POA: Diagnosis not present

## 2020-10-20 DIAGNOSIS — M1990 Unspecified osteoarthritis, unspecified site: Secondary | ICD-10-CM | POA: Diagnosis not present

## 2020-10-20 DIAGNOSIS — H353130 Nonexudative age-related macular degeneration, bilateral, stage unspecified: Secondary | ICD-10-CM | POA: Diagnosis not present

## 2020-10-20 DIAGNOSIS — H25813 Combined forms of age-related cataract, bilateral: Secondary | ICD-10-CM | POA: Diagnosis not present

## 2020-10-20 DIAGNOSIS — H02834 Dermatochalasis of left upper eyelid: Secondary | ICD-10-CM | POA: Diagnosis not present

## 2020-10-20 DIAGNOSIS — Z940 Kidney transplant status: Secondary | ICD-10-CM | POA: Diagnosis not present

## 2020-10-21 ENCOUNTER — Ambulatory Visit: Payer: Medicare Other

## 2020-10-21 DIAGNOSIS — Z961 Presence of intraocular lens: Secondary | ICD-10-CM | POA: Diagnosis not present

## 2020-10-21 DIAGNOSIS — H52203 Unspecified astigmatism, bilateral: Secondary | ICD-10-CM | POA: Diagnosis not present

## 2020-10-23 ENCOUNTER — Ambulatory Visit (INDEPENDENT_AMBULATORY_CARE_PROVIDER_SITE_OTHER): Payer: Medicare Other

## 2020-10-23 ENCOUNTER — Other Ambulatory Visit: Payer: Self-pay

## 2020-10-23 DIAGNOSIS — E039 Hypothyroidism, unspecified: Secondary | ICD-10-CM | POA: Diagnosis not present

## 2020-10-23 LAB — TSH: TSH: 3.46 u[IU]/mL (ref 0.35–4.50)

## 2020-10-29 ENCOUNTER — Encounter: Payer: Self-pay | Admitting: Family Medicine

## 2020-11-05 DIAGNOSIS — Z940 Kidney transplant status: Secondary | ICD-10-CM | POA: Diagnosis not present

## 2020-11-13 DIAGNOSIS — H25812 Combined forms of age-related cataract, left eye: Secondary | ICD-10-CM | POA: Diagnosis not present

## 2020-11-17 ENCOUNTER — Other Ambulatory Visit: Payer: Self-pay | Admitting: Family Medicine

## 2020-12-01 ENCOUNTER — Other Ambulatory Visit: Payer: Self-pay | Admitting: Family Medicine

## 2020-12-05 ENCOUNTER — Other Ambulatory Visit: Payer: Self-pay

## 2020-12-05 ENCOUNTER — Emergency Department (INDEPENDENT_AMBULATORY_CARE_PROVIDER_SITE_OTHER)
Admission: RE | Admit: 2020-12-05 | Discharge: 2020-12-05 | Disposition: A | Discharge status: Home / Self Care | Payer: Medicare Other | Source: Ambulatory Visit

## 2020-12-05 VITALS — BP 146/85 | HR 68 | Temp 98.3°F | Resp 20 | Ht 65.0 in | Wt 240.0 lb

## 2020-12-05 DIAGNOSIS — R350 Frequency of micturition: Secondary | ICD-10-CM | POA: Diagnosis not present

## 2020-12-05 LAB — POCT URINALYSIS DIP (MANUAL ENTRY)
Bilirubin, UA: NEGATIVE
Glucose, UA: NEGATIVE mg/dL
Ketones, POC UA: NEGATIVE mg/dL
Nitrite, UA: NEGATIVE
Protein Ur, POC: NEGATIVE mg/dL
Spec Grav, UA: 1.02 (ref 1.010–1.025)
Urobilinogen, UA: 0.2 E.U./dL
pH, UA: 7 (ref 5.0–8.0)

## 2020-12-05 NOTE — Discharge Instructions (Addendum)
You may have a urinary tract infection. We are going to culture your urine and will call you as soon as we have the results.   Drink plenty of water, 8-10 glasses per day.   You may take AZO over the counter for painful urination.  Follow up with your primary care provider as needed.   Go to the Emergency Department if you experience severe pain, shortness of breath, high fever, or other concerns.   

## 2020-12-05 NOTE — ED Provider Notes (Signed)
[facility]   CC: UTI  SUBJECTIVE:  Tabitha Green is a 75 y.o. female who complains of urinary frequency, urgency and dysuria for the past 2 days.  Patient denies a precipitating event, recent sexual encounter, excessive caffeine intake. Localizes the pain to the lower abdomen.  Pain is intermittent and describes it as sharp.  Reports that she is experiencing urgency as well.  Has not tried OTC medications without relief.  Symptoms are made worse with urination. Admits to similar symptoms in the past.  Denies fever, chills, nausea, vomiting, flank pain, abnormal vaginal discharge or bleeding, hematuria.    LMP: No LMP recorded. Patient has had a hysterectomy.  ROS: As in HPI.  All other pertinent ROS negative.     Past Medical History:  Diagnosis Date  . Anemia of chronic kidney failure    r/t kidney function- receives Procrit  . Anxiety   . Chronic renal insufficiency, stage III (moderate) (HCC) 10/2015   Post-transplant baseline sCr 1.2-1.4.    . CMV infection [facility]) summer 2017   Valcyte per ID/Renal transplant team  . Diverticulosis    a. 05/2012 colonoscopy  . Erosive lichen planus of vulva    Topical steroids (managed by Dr. Mila [facility].  . ESRD (end stage renal disease) ([place])    began dialysis 2014--followed by Dr. [name].  Received deceased donor kidney transplant 10/2015.  [name] FSGS (focal segmental glomerulosclerosis)    right kidney; hx of left renal cell cancer and got nephrectomy 1993.  [name] GERD (gastroesophageal reflux disease)   . Gout   . Heart murmur    (Diastolic) ECHO 10/8099 showed that this murmur is coming from pt's R arm AV fistula  . Hiatal hernia   . History of renal cell cancer 1993   Left nephrectomy  . Hyperlipidemia   . Hypertension   . Hypothyroidism   . Impingement syndrome of left shoulder 04/2016   Dr. Christy Sartorius to PT  . Lumbar spinal stenosis    Chronic LBP with bilat  neurogenic claudication  . Metatarsal fracture, pathologic 01/2018   Right; orthocarolina-->post op shoe continued, f/u x-ray planned.  . Osteoarthritis of left knee 08/2017   severe, diffuse, tricompartmental.  Responded to steroid injection.    . Osteoporosis 06/07/2016   DEXA T-score of -3.1.  Fosamax planned 2017 but dental work prevented start..  Pathologic toe fx 12/2017--repeat DEXA 08/2018, T-score -3.3 radius.  . Renal transplant recipient 2015-11-09   Baseline Cr as of 02/2018= 1.0-1.2.  . SVT (supraventricular tachycardia) ([place])   . Vitamin B12 deficiency 12/2018   Starting replacement as of 12/11/2018   Past Surgical History:  Procedure Laterality Date  . AV FISTULA PLACEMENT Right    Right arm: aneurismal dilatation 2017 being followed by CV surgeons  . CARDIOVASCULAR STRESS TEST     03/10/15 ETT ([facility]): Exercise ECG negative at 83% max predicted HR.   [name] CATARACT EXTRACTION    . COLONOSCOPY  2003; 05/2012   diverticulosis, no polyps.  Recall 10 yrs  . colonoscopy with polypectomy     Dr Henrene Pastor  . DEXA  06/07/2016; 08/22/2018   2017 T-score -3.1.  2020 T score -3.3  . KIDNEY TRANSPLANT  Nov 09, 2015   Deceased donor kidney transplant, with Thymoglobulin induction  . LIGATION OF ARTERIOVENOUS  FISTULA Right 02/14/2017   Procedure: LIGATION OF ARTERIOVENOUS  FISTULA;  Surgeon: Angelia Mould, MD;  Location: [place];  Service: Vascular;  Laterality:  Right;  [name] NEPHRECTOMY  1993   for malignancy- left  . RENAL BIOPSY     right  . RESECTION OF ARTERIOVENOUS FISTULA ANEURYSM Right 02/14/2017   Procedure: EXCISION OF RIGHT BRACHIOCEPHALIC ARTERIOVENOUS FISTULA ANEURYSM;  Surgeon: Angelia Mould, MD;  Location: [place];  Service: Vascular;  Laterality: Right;  . TEE WITHOUT CARDIOVERSION N/A 08/27/2013   Procedure: TRANSESOPHAGEAL ECHOCARDIOGRAM (TEE);  Surgeon: [name], MD;  [facility] ENDOSCOPY;  Service: Cardiovascular;  Laterality: N/A;  . TOTAL ABDOMINAL  HYSTERECTOMY W/ BILATERAL SALPINGOOPHORECTOMY  1997   fibroids  . TRANSTHORACIC ECHOCARDIOGRAM     03/10/15 echo ([facility]): LV cavity normal in size, focal basal hypertrophy, normal systolic function, EF 17% (visual est). Normal wall motion, no regional wall motion abnormalities. Mild diastolic dysfunction with normal LA chamber size. No significant valve stenosis or regurgitation.  . VULVA / PERINEUM BIOPSY  2015   Allergies  Allergen Reactions  . Cefaclor Rash  . Naproxen Rash  . Enalapril Maleate Cough    Vasotec  . Metoprolol Tartrate Rash    On legs   No current facility-administered medications on file prior to encounter.   Current Outpatient Medications on File Prior to Encounter  Medication Sig Dispense Refill  . aspirin EC 81 MG EC tablet Take 1 tablet (81 mg total) by mouth daily.    Skipper Cliche SALINE NASAL DROPS NA Place 2 sprays into both nostrils daily.    . Camphor-Eucalyptus-Menthol (VICKS VAPORUB EX) Apply 1 application topically daily.    . Cyanocobalamin (B-12 PO) Take by mouth daily.    [name] doxazosin (CARDURA) 2 MG tablet TAKE 1 TABLET BY MOUTH  TWICE DAILY 180 tablet 1  . hydrochlorothiazide (HYDRODIURIL) 25 MG tablet TAKE 1 TABLET BY MOUTH  DAILY 90 tablet 3  . levothyroxine (SYNTHROID) 125 MCG tablet TAKE 1 TABLET BY MOUTH  DAILY 90 tablet 3  . LORazepam (ATIVAN) 0.5 MG tablet Take 1 tablet (0.5 mg total) by mouth 2 (two) times daily as needed for anxiety. 30 tablet 1  . losartan (COZAAR) 100 MG tablet TAKE 1 TABLET BY MOUTH  DAILY 90 tablet 1  . Magnesium Oxide 500 MG TABS Take 1 tablet by mouth daily.    . meclizine (ANTIVERT) 25 MG tablet Take 1 tablet (25 mg total) by mouth 3 (three) times daily as needed for dizziness or nausea. (Patient not taking: No sig reported) 20 tablet 0  . mycophenolate (MYFORTIC) 360 MG TBEC EC tablet Take 360 mg by mouth 2 (two) times daily.    . silver sulfADIAZINE (SILVADENE) 1 % cream Apply 1 application topically  daily. Mixes with Triamcinolone cream    . SODIUM BICARBONATE PO Take 650 mg by mouth 2 (two) times daily. Takes 1 in AM and 1 at bedtime    . sulfamethoxazole-trimethoprim (BACTRIM) 400-80 MG tablet Take 1 tablet by mouth daily.    . tacrolimus (PROGRAF) 1 MG capsule Take 4 mg by mouth 2 (two) times daily. Pt takes 4 in the morning and 3 at night.    . triamcinolone (NASACORT) 55 MCG/ACT AERO nasal inhaler Place 2 sprays into the nose daily.    [name] triamcinolone cream (KENALOG) 0.1 % Apply 1 application topically daily.    . verapamil (CALAN-SR) 120 MG CR tablet TAKE 1 TABLET BY MOUTH  EVERY AFTERNOON 90 tablet 3  . verapamil (CALAN-SR) 240 MG CR tablet TAKE 1 TABLET BY MOUTH IN  THE MORNING 90 tablet 3  Social History   Socioeconomic History  . Marital status: Married    Spouse name: Not on file  . Number of children: 2  . Years of education: Not on file  . Highest education level: Not on file  Occupational History  . Occupation: Retired  Tobacco Use  . Smoking status: Never Smoker  . Smokeless tobacco: Never Used  Vaping Use  . Vaping Use: Never used  Substance and Sexual Activity  . Alcohol use: Yes    Comment: rarely  . Drug use: No  . Sexual activity: Not on file  Other Topics Concern  . Not on file  Social History Narrative   Lives in [place] with husband.  Retired.  Previously worked in [employer] @ [employer]   No T/A/Ds.   Social Determinants of Health   Financial Resource Strain: Low Risk   . Difficulty of Paying Living Expenses: Not hard at all  Food Insecurity: No Food Insecurity  . Worried About Charity fundraiser in the Last Year: Never true  . Ran Out of Food in the Last Year: Never true  Transportation Needs: No Transportation Needs  . Lack of Transportation (Medical): No  . Lack of Transportation (Non-Medical): No  Physical Activity: Inactive  . Days of Exercise per Week: 0 days  . Minutes of Exercise per Session: 0 min  Stress: No Stress Concern  Present  . Feeling of Stress : Only a little  Social Connections: Moderately Integrated  . Frequency of Communication with Friends and Family: More than three times a week  . Frequency of Social Gatherings with Friends and Family: More than three times a week  . Attends Religious Services: 1 to 4 times per year  . Active Member of Clubs or Organizations: No  . Attends Archivist Meetings: Never  . Marital Status: Married  Human resources officer Violence: Not At Risk  . Fear of Current or Ex-Partner: No  . Emotionally Abused: No  . Physically Abused: No  . Sexually Abused: No   Family History  Problem Relation Age of Onset  . Deep vein thrombosis Mother        post thyroid surgery  . Hypertension Mother   . Heart attack Father 47       deceased  . Hypertension Father   . Cancer Sister        breast  . Cancer Paternal Aunt        pancreatic  . Diabetes Maternal Grandfather   . Stroke Paternal Grandmother        in 61s  . Fibroids Daughter   . Colon cancer Neg Hx   . Esophageal cancer Neg Hx   . Stomach cancer Neg Hx   . Rectal cancer Neg Hx     OBJECTIVE:  Vitals:   12/05/20 1313 12/05/20 1315  BP:  (!) 146/85  Pulse:  68  Resp:  20  Temp:  98.3 F (36.8 C)  TempSrc:  Oral  SpO2:  95%  Weight: 240 lb (108.9 kg)   Height: 5\' 5"  (1.651 m)    General appearance: AOx3 in no acute distress HEENT: NCAT. Oropharynx clear.  Lungs: clear to auscultation bilaterally without adventitious breath sounds Heart: regular rate and rhythm. Radial pulses 2+ symmetrical bilaterally Abdomen: soft; non-distended; no tenderness; bowel sounds present; no guarding or rebound tenderness Back: no CVA tenderness Extremities: no edema; symmetrical with no gross deformities Skin: warm and dry Neurologic: Ambulates from chair to exam table without difficulty Psychological:  alert and cooperative; normal mood and affect  Labs Reviewed  POCT URINALYSIS DIP (MANUAL ENTRY) - Abnormal;  Notable for the following components:      Result Value   Blood, UA trace-intact (*)    Leukocytes, UA Trace (*)    All other components within normal limits  URINE CULTURE    ASSESSMENT & PLAN:  1. Urinary frequency    UA not remarkable for infection in office today Urine culture sent given symptoms We will call you with abnormal results that need further treatment Push fluids and get plenty of rest  Follow up with PCP if symptoms persists Return here or go to ER if you have any new or worsening symptoms such as fever, worsening abdominal pain, nausea/vomiting, flank pain  Outlined signs and symptoms indicating need for more acute intervention Patient verbalized understanding After Visit Summary given     Faustino Congress, NP 12/05/20 [phone]

## 2020-12-05 NOTE — ED Triage Notes (Signed)
Pt presents to Urgent Care with c/o urinary frequency and mild dysuria since yesterday.

## 2020-12-08 ENCOUNTER — Telehealth (HOSPITAL_COMMUNITY): Payer: Self-pay | Admitting: Emergency Medicine

## 2020-12-08 LAB — URINE CULTURE
MICRO NUMBER:: 11836021
SPECIMEN QUALITY:: ADEQUATE

## 2020-12-08 MED ORDER — NITROFURANTOIN MONOHYD MACRO 100 MG PO CAPS: 100.0000 mg | ORAL_CAPSULE | Freq: Two times a day (BID) | ORAL | 0 refills | Status: DC

## 2020-12-17 ENCOUNTER — Other Ambulatory Visit: Payer: Self-pay | Admitting: Family Medicine

## 2020-12-17 DIAGNOSIS — I10 Essential (primary) hypertension: Secondary | ICD-10-CM | POA: Diagnosis not present

## 2020-12-17 DIAGNOSIS — N250 Renal osteodystrophy: Secondary | ICD-10-CM | POA: Diagnosis not present

## 2020-12-17 DIAGNOSIS — D631 Anemia in chronic kidney disease: Secondary | ICD-10-CM | POA: Diagnosis not present

## 2020-12-17 DIAGNOSIS — Z940 Kidney transplant status: Secondary | ICD-10-CM | POA: Diagnosis not present

## 2020-12-17 DIAGNOSIS — Z79899 Other long term (current) drug therapy: Secondary | ICD-10-CM | POA: Diagnosis not present

## 2020-12-17 DIAGNOSIS — N189 Chronic kidney disease, unspecified: Secondary | ICD-10-CM | POA: Diagnosis not present

## 2020-12-17 DIAGNOSIS — Z792 Long term (current) use of antibiotics: Secondary | ICD-10-CM | POA: Diagnosis not present

## 2020-12-17 DIAGNOSIS — Z1231 Encounter for screening mammogram for malignant neoplasm of breast: Secondary | ICD-10-CM

## 2020-12-21 ENCOUNTER — Encounter: Payer: Self-pay | Admitting: Family Medicine

## 2020-12-21 DIAGNOSIS — R799 Abnormal finding of blood chemistry, unspecified: Secondary | ICD-10-CM | POA: Diagnosis not present

## 2020-12-21 DIAGNOSIS — Z79899 Other long term (current) drug therapy: Secondary | ICD-10-CM | POA: Diagnosis not present

## 2020-12-21 DIAGNOSIS — Z940 Kidney transplant status: Secondary | ICD-10-CM | POA: Diagnosis not present

## 2020-12-30 ENCOUNTER — Ambulatory Visit (INDEPENDENT_AMBULATORY_CARE_PROVIDER_SITE_OTHER): Payer: Medicare Other

## 2020-12-30 ENCOUNTER — Other Ambulatory Visit: Payer: Self-pay

## 2020-12-30 DIAGNOSIS — Z1231 Encounter for screening mammogram for malignant neoplasm of breast: Secondary | ICD-10-CM | POA: Diagnosis not present

## 2020-12-30 DIAGNOSIS — M810 Age-related osteoporosis without current pathological fracture: Secondary | ICD-10-CM

## 2020-12-30 DIAGNOSIS — Z780 Asymptomatic menopausal state: Secondary | ICD-10-CM | POA: Diagnosis not present

## 2020-12-30 IMAGING — MG MM DIGITAL SCREENING BILAT W/ TOMO AND CAD
8 of 17 series · 8 of 40 positions shown · non-contrast
Comparison: Previous exam(s).

CLINICAL DATA: Screening.

EXAM:
DIGITAL SCREENING BILATERAL MAMMOGRAM WITH TOMOSYNTHESIS AND CAD
TECHNIQUE: Bilateral screening digital craniocaudal and mediolateral oblique
mammograms were obtained. Bilateral screening digital breast
tomosynthesis was performed. The images were evaluated with
computer-aided detection.

[L CC synth-2D (1 of 2)]
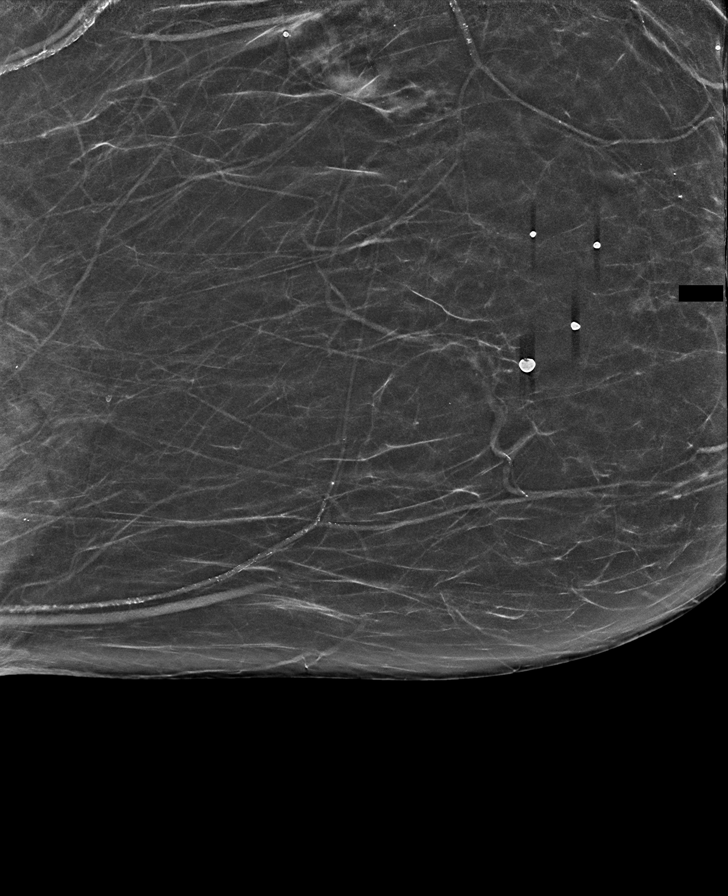

[R CC synth-2D (1 of 2)]
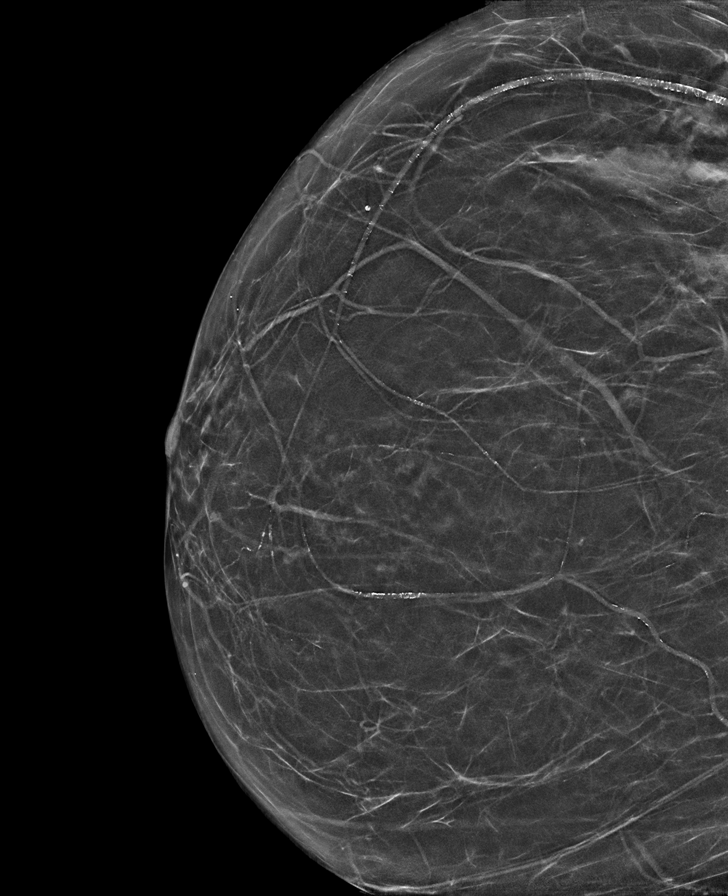

[L CC synth-2D (2 of 2)]
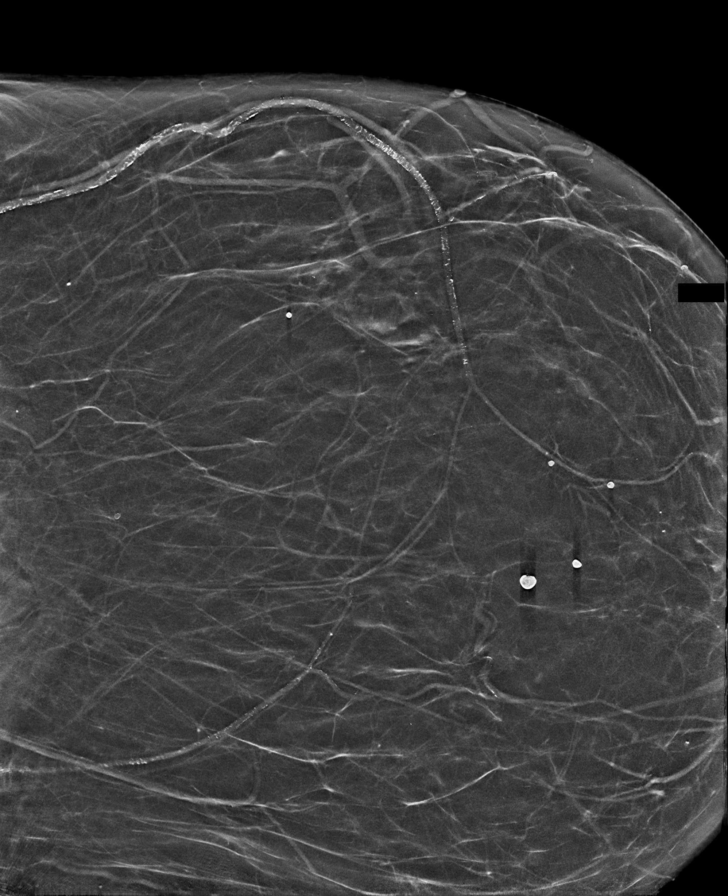

[L MLO synth-2D]
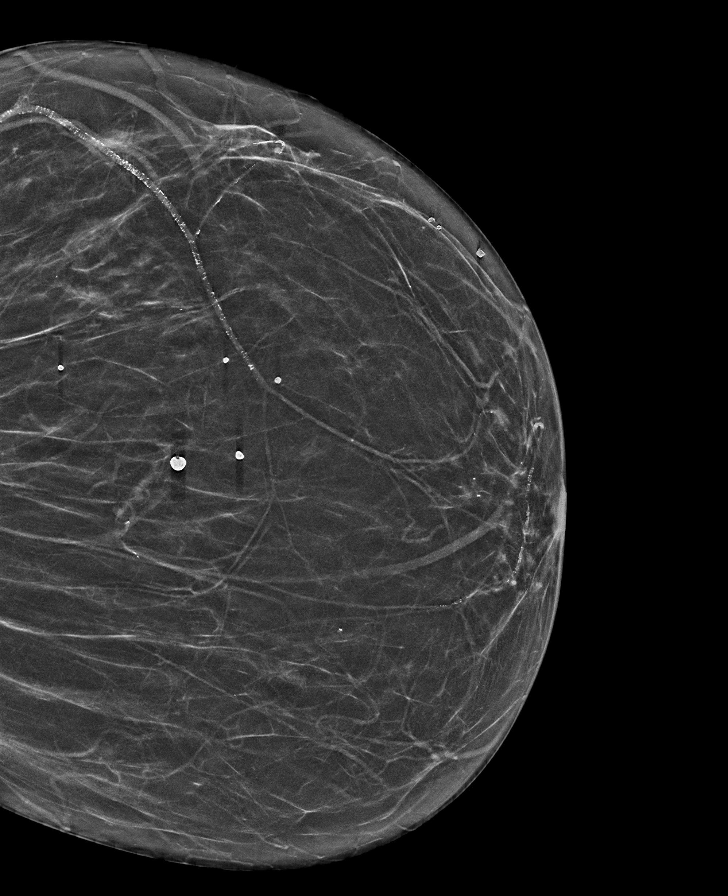

[R MLO synth-2D (1 of 2)]
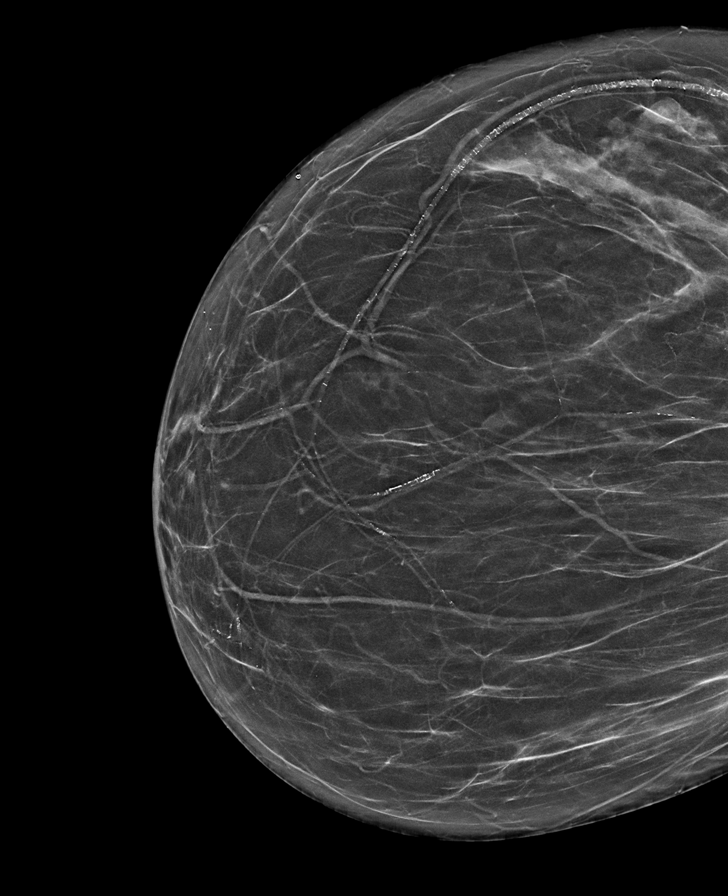

[R CC synth-2D (2 of 2)]
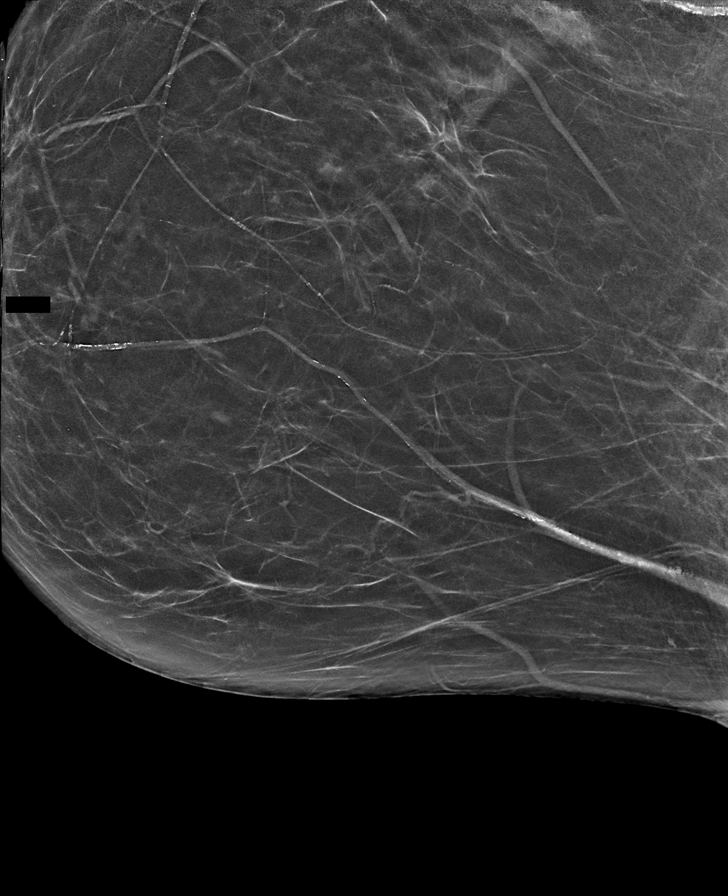

[R MLO synth-2D (2 of 2)]
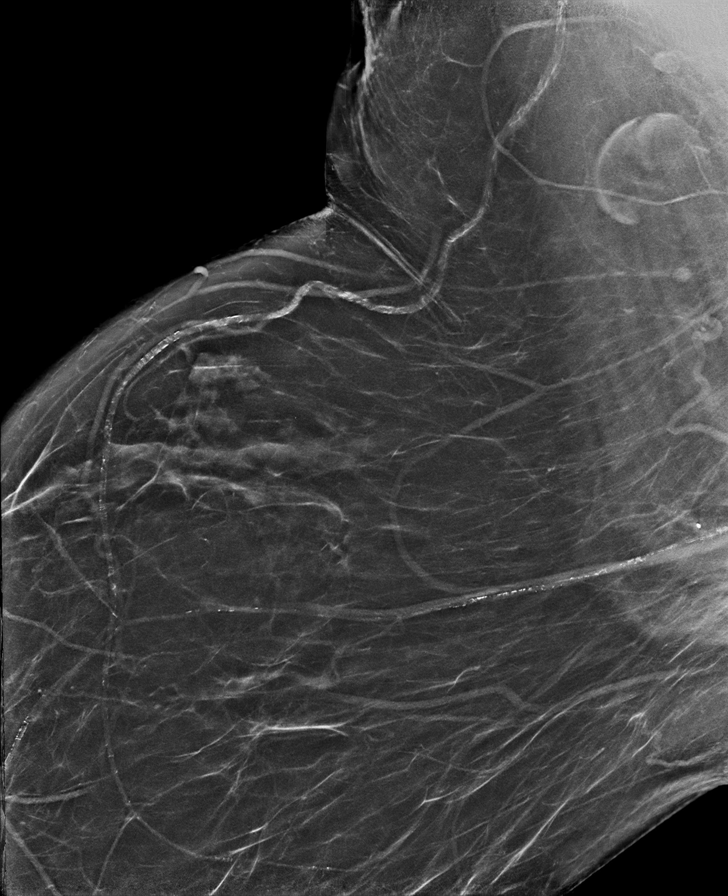

[R CC tomo · tomo slice 33/64.0]
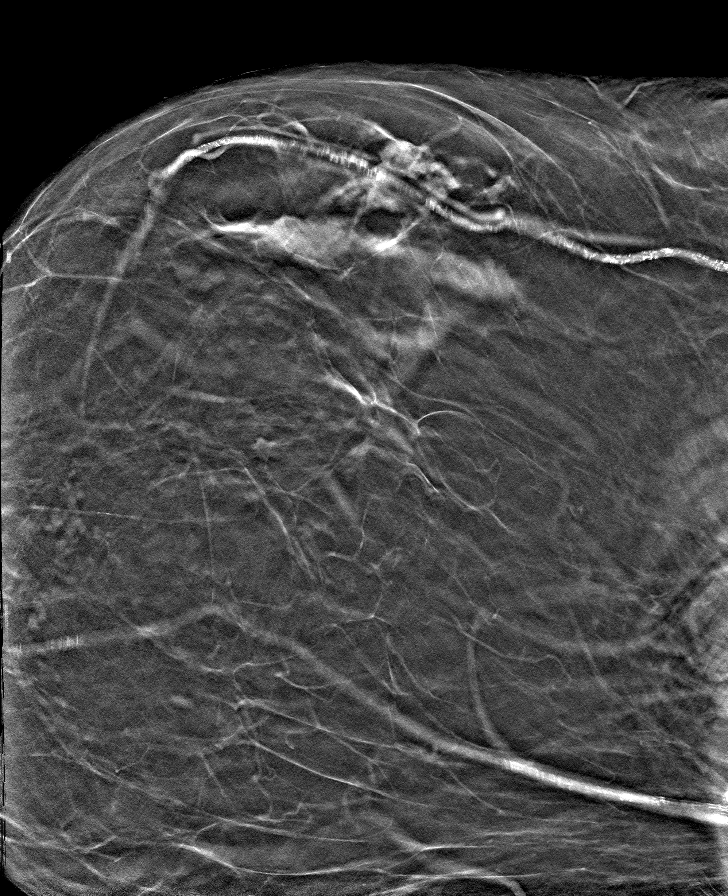

[8 of 40 positions shown; findings below may reference images not displayed]

ACR Breast Density Category b: There are scattered areas of
fibroglandular density.
FINDINGS: There are no findings suspicious for malignancy. The images were
evaluated with computer-aided detection.
IMPRESSION: No mammographic evidence of malignancy. A result letter of this
screening mammogram will be mailed directly to the patient.

RECOMMENDATION:
Screening mammogram in one year. (Code:[OD])

BI-RADS CATEGORY  1: Negative.

## 2021-01-12 DIAGNOSIS — L438 Other lichen planus: Secondary | ICD-10-CM | POA: Diagnosis not present

## 2021-01-12 DIAGNOSIS — L821 Other seborrheic keratosis: Secondary | ICD-10-CM | POA: Diagnosis not present

## 2021-01-12 DIAGNOSIS — L304 Erythema intertrigo: Secondary | ICD-10-CM | POA: Diagnosis not present

## 2021-03-08 DIAGNOSIS — T8610 Unspecified complication of kidney transplant: Secondary | ICD-10-CM | POA: Diagnosis not present

## 2021-03-08 DIAGNOSIS — Z940 Kidney transplant status: Secondary | ICD-10-CM | POA: Diagnosis not present

## 2021-03-08 DIAGNOSIS — Z79899 Other long term (current) drug therapy: Secondary | ICD-10-CM | POA: Diagnosis not present

## 2021-03-11 ENCOUNTER — Ambulatory Visit (INDEPENDENT_AMBULATORY_CARE_PROVIDER_SITE_OTHER): Payer: Medicare Other | Admitting: Family Medicine

## 2021-03-11 ENCOUNTER — Encounter: Payer: Self-pay | Admitting: Family Medicine

## 2021-03-11 ENCOUNTER — Other Ambulatory Visit: Payer: Self-pay

## 2021-03-11 VITALS — BP 115/69 | HR 74 | Temp 97.8°F | Ht 65.0 in | Wt 245.0 lb

## 2021-03-11 DIAGNOSIS — K439 Ventral hernia without obstruction or gangrene: Secondary | ICD-10-CM | POA: Diagnosis not present

## 2021-03-11 DIAGNOSIS — R5383 Other fatigue: Secondary | ICD-10-CM

## 2021-03-11 DIAGNOSIS — M810 Age-related osteoporosis without current pathological fracture: Secondary | ICD-10-CM | POA: Diagnosis not present

## 2021-03-11 DIAGNOSIS — Z940 Kidney transplant status: Secondary | ICD-10-CM

## 2021-03-11 LAB — VITAMIN B12: Vitamin B-12: 451 pg/mL (ref 211–911)

## 2021-03-11 LAB — VITAMIN D 25 HYDROXY (VIT D DEFICIENCY, FRACTURES): VITD: 24.69 ng/mL — ABNORMAL LOW (ref 30.00–100.00)

## 2021-03-11 LAB — TSH: TSH: 3.19 u[IU]/mL (ref 0.35–5.50)

## 2021-03-11 MED ORDER — LORAZEPAM 0.5 MG PO TABS: 0.5000 mg | ORAL_TABLET | Freq: Two times a day (BID) | ORAL | 1 refills | Status: DC | PRN

## 2021-03-11 MED ORDER — ALENDRONATE SODIUM 70 MG PO TABS
70.0000 mg | ORAL_TABLET | ORAL | 3 refills | Status: DC
Start: 2021-03-11 — End: 2022-06-13

## 2021-03-11 MED ORDER — VERAPAMIL HCL ER 120 MG PO TBCR: EXTENDED_RELEASE_TABLET | ORAL | 0 refills | Status: DC

## 2021-03-11 NOTE — Patient Instructions (Signed)
Take 1500mg  calcium and 1000 Units of vitamin D once a day.

## 2021-03-11 NOTE — Progress Notes (Signed)
OFFICE VISIT  03/11/2021  CC:  Chief Complaint  Patient presents with   Fatigue    X 1 month   HPI:    Patient is a 75 y.o. Caucasian female who presents for fatigue. Also wants to discuss osteoporosis, recent rpt DEXA 12/2020 with T score -3.7. She has declined tx in the past for fear of jaw osteonecrosis---she had been having some dental work around the time of consideration of med tx.  Chronic fatigue, worse lately.  Mood is down some, is ready for cooler weather. Signif anxiety at times, which lorazepam prn helps but she rarely takes it. No RLS sx's.  Mind won't shut down.  Sleep is broken, sometimes trouble getting back to sleep.  Sometimes probs initiating sleep.  No recent med changes.   HR typically 60s or higher, nothig new for her, bp's at home normal.  Occ home oxygen check >93%. Denies CP or SOB.  Appetite very good.   No LE swelling. Takes no otc supplements with any regularity.  Gradually has noticed R lower abd protruding more than L ---over last few months.  No pain.    Recent labs through [facility] last week reviewed today: Hb 13, normal wbc and plts. Lytes normal, albumin normal.  Creatinine 1.3, GFR 45.  Tacrolimus level 8.6.   ROS as above, plus--> no fevers, no wheezing, no cough, no dizziness, no HAs, no rashes, no melena/hematochezia.  No polyuria or polydipsia.  No myalgias or arthralgias.  No focal weakness, paresthesias, or tremors.  No acute vision or hearing abnormalities.  No dysuria or unusual/new urinary urgency or frequency.  No recent changes in lower legs. No n/v/d or abd pain.  No palpitations.     Past Medical History:  Diagnosis Date   Anxiety    Chronic renal insufficiency, stage III (moderate) (HCC) 10/2015   Post-transplant baseline sCr 1.2-1.4.     CMV infection [facility]) summer 2017   Valcyte per ID/Renal transplant team   Diverticulosis    a. 05/2012 colonoscopy   Erosive lichen planus of vulva    Topical steroids (managed by Dr.  Mila Palmer [facility] via [facility] outpt services.   ESRD (end stage renal disease) ([place])    began dialysis 2014--followed by Dr. [name].  Received deceased donor kidney transplant 10/2015.   FSGS (focal segmental glomerulosclerosis)    right kidney; hx of left renal cell cancer and got nephrectomy 1993.   GERD (gastroesophageal reflux disease)    Gout    Heart murmur    (Diastolic) ECHO [id number]/[id number] showed that this murmur is coming from pt's R arm AV fistula   Hiatal hernia    History of renal cell cancer 1993   Left nephrectomy   Hyperlipidemia    Hypertension    Hypothyroidism    Impingement syndrome of left shoulder 04/2016   Dr. Christy Sartorius to PT   Lumbar spinal stenosis    Chronic LBP with bilat neurogenic claudication   Metatarsal fracture, pathologic 01/2018   Right; orthocarolina-->post op shoe continued, f/u x-ray planned.   Osteoarthritis of left knee 08/2017   severe, diffuse, tricompartmental.  Responded to steroid injection.     Osteoporosis 06/07/2016   DEXA T-score of -3.1.  Fosamax planned 2017 but dental work prevented start..  Pathologic toe fx 12/2017--repeat DEXA 08/2018, T-score -3.3 radius. 12/2020 T score radius -3.7.   Renal transplant recipient 11/06/2015   Baseline Cr as of 09/2020 1.2-1.4   SVT (supraventricular tachycardia) (HCC)    Vitamin  B12 deficiency 12/2018   Starting replacement as of 12/11/2018    Past Surgical History:  Procedure Laterality Date   AV FISTULA PLACEMENT Right    Right arm: aneurismal dilatation 2017 being followed by CV surgeons   CARDIOVASCULAR STRESS TEST     03/10/15 ETT ([facility]): Exercise ECG negative at 83% max predicted HR.    CATARACT EXTRACTION     COLONOSCOPY  2003; 05/2012   diverticulosis, no polyps.  Recall 10 yrs   colonoscopy with polypectomy     Dr Henrene Pastor   DEXA  06/07/2016; 08/22/2018; 12/30/20   2017 T-score -3.1.  2020 T score -3.3.  2022 T score -3.7   KIDNEY TRANSPLANT  2015-11-11   Deceased  donor kidney transplant, with Thymoglobulin induction   LIGATION OF ARTERIOVENOUS  FISTULA Right 02/14/2017   Procedure: LIGATION OF ARTERIOVENOUS  FISTULA;  Surgeon: Angelia Mould, MD;  Location: [place];  Service: Vascular;  Laterality: Right;   [place]   for malignancy- left   RENAL BIOPSY     right   RESECTION OF ARTERIOVENOUS FISTULA ANEURYSM Right 02/14/2017   Procedure: EXCISION OF RIGHT BRACHIOCEPHALIC ARTERIOVENOUS FISTULA ANEURYSM;  Surgeon: Angelia Mould, MD;  Location: [place];  Service: Vascular;  Laterality: Right;   TEE WITHOUT CARDIOVERSION N/A 08/27/2013   Procedure: TRANSESOPHAGEAL ECHOCARDIOGRAM (TEE);  Surgeon: [name], MD;  Location: [facility] ENDOSCOPY;  Service: Cardiovascular;  Laterality: N/A;   TOTAL ABDOMINAL HYSTERECTOMY W/ BILATERAL SALPINGOOPHORECTOMY  1997   fibroids   TRANSTHORACIC ECHOCARDIOGRAM     03/10/15 echo ([facility], [facility]): LV cavity normal in size, focal basal hypertrophy, normal systolic function, EF 13% (visual est). Normal wall motion, no regional wall motion abnormalities. Mild diastolic dysfunction with normal LA chamber size. No significant valve stenosis or regurgitation.   VULVA / PERINEUM BIOPSY  2015    Outpatient Medications Prior to Visit  Medication Sig Dispense Refill   aspirin EC 81 MG EC tablet Take 1 tablet (81 mg total) by mouth daily.     AYR SALINE NASAL DROPS NA Place 2 sprays into both nostrils daily.     Camphor-Eucalyptus-Menthol (VICKS VAPORUB EX) Apply 1 application topically daily.     Cyanocobalamin (B-12 PO) Take by mouth daily.     doxazosin (CARDURA) 2 MG tablet TAKE 1 TABLET BY MOUTH  TWICE DAILY 180 tablet 1   hydrochlorothiazide (HYDRODIURIL) 25 MG tablet TAKE 1 TABLET BY MOUTH  DAILY 90 tablet 3   levothyroxine (SYNTHROID) 125 MCG tablet TAKE 1 TABLET BY MOUTH  DAILY 90 tablet 3   losartan (COZAAR) 100 MG tablet TAKE 1 TABLET BY MOUTH  DAILY 90 tablet 1   Magnesium Oxide 500 MG  TABS Take 1 tablet by mouth daily.     meclizine (ANTIVERT) 25 MG tablet Take 1 tablet (25 mg total) by mouth 3 (three) times daily as needed for dizziness or nausea. 20 tablet 0   mycophenolate (MYFORTIC) 360 MG TBEC EC tablet Take 360 mg by mouth 2 (two) times daily.     silver sulfADIAZINE (SILVADENE) 1 % cream Apply 1 application topically daily. Mixes with Triamcinolone cream     SODIUM BICARBONATE PO Take 650 mg by mouth 2 (two) times daily. Takes 1 in AM and 1 at bedtime     sulfamethoxazole-trimethoprim (BACTRIM) 400-80 MG tablet Take 1 tablet by mouth daily.     tacrolimus (PROGRAF) 1 MG capsule Take 4 mg by mouth 2 (two) times daily. Pt  takes 4 in the morning and 3 at night.     triamcinolone (NASACORT) 55 MCG/ACT AERO nasal inhaler Place 2 sprays into the nose daily.     triamcinolone cream (KENALOG) 0.1 % Apply 1 application topically daily.     verapamil (CALAN-SR) 240 MG CR tablet TAKE 1 TABLET BY MOUTH IN  THE MORNING 90 tablet 3   LORazepam (ATIVAN) 0.5 MG tablet Take 1 tablet (0.5 mg total) by mouth 2 (two) times daily as needed for anxiety. 30 tablet 1   verapamil (CALAN-SR) 120 MG CR tablet TAKE 1 TABLET BY MOUTH  EVERY AFTERNOON 90 tablet 3   nitrofurantoin, macrocrystal-monohydrate, (MACROBID) 100 MG capsule Take 1 capsule (100 mg total) by mouth 2 (two) times daily. (Patient not taking: Reported on 03/11/2021) 10 capsule 0   No facility-administered medications prior to visit.    Allergies  Allergen Reactions   Labetalol     Other reaction(s): Other   Cefaclor Rash   Naproxen Rash   Enalapril Maleate Cough    Vasotec   Metoprolol Tartrate Rash    On legs    ROS As per HPI  PE: Vitals with BMI 03/11/2021 12/05/2020 09/08/2020  Height 5\' 5"  5\' 5"  5\' 5"   Weight 245 lbs 240 lbs 234 lbs 13 oz  BMI 40.77 01.75 10.25  Systolic 852 778 242  Diastolic 69 85 80  Pulse 74 68 85   Gen: Alert, well appearing.  Patient is oriented to person, place, time, and  situation. AFFECT: pleasant, lucid thought and speech. PNT:IRWE: no injection, icteris, swelling, or exudate.  EOMI, PERRLA. Mouth: lips without lesion/swelling.  Oral mucosa pink and moist. Oropharynx without erythema, exudate, or swelling.  CV: RRR, no m/r/g.   LUNGS: CTA bilat, nonlabored resps, good aeration in all lung fields. ABD: soft, NT, large RLQ abd protrusion with palpable consistency that is denser than adipose tissue, no tenderness.  EXT: no clubbing or cyanosis.  no edema.    LABS:  Lab Results  Component Value Date   TSH 3.46 10/23/2020   Lab Results  Component Value Date   VITAMINB12 355 09/08/2020   Lab Results  Component Value Date   IRON 118 08/02/2012   TIBC 242 (L) 08/02/2012   FERRITIN 164 08/02/2012   Lab Results  Component Value Date   WBC 5.9 09/30/2020   HGB 12.5 09/30/2020   HCT 39 03/03/2020   MCV 92.8 02/18/2019   PLT 280 09/30/2020   Lab Results  Component Value Date   CREATININE 1.3 (A) 09/30/2020   BUN 24 (A) 09/30/2020   NA 139 09/30/2020   K 4.5 09/30/2020   CL 105 09/30/2020   CO2 27 (A) 09/30/2020   Lab Results  Component Value Date   ALT 13 03/02/2020   AST 16 03/02/2020   ALKPHOS 87 03/02/2020   BILITOT 0.6 02/18/2019   Lab Results  Component Value Date   CHOL 152 11/18/2019   Lab Results  Component Value Date   HDL 41 11/18/2019   Lab Results  Component Value Date   LDLCALC 90 11/18/2019   Lab Results  Component Value Date   TRIG 117 11/18/2019   Lab Results  Component Value Date   CHOLHDL 4 02/18/2019   Lab Results  Component Value Date   HGBA1C 5.4 02/23/2012   IMPRESSION AND PLAN:  1) Chronic fatigue: unknown etiology.  No red flags for ominous dx such as malignancy, cardiac problem, or systemic inflammatory dz. Recent cmet and cbc  normal last week at nephrologist. Will monitor TSH, iron panel, vit B12.  2) Osteoporosis: she will not be getting any dental work after all. Start alendronate 70mg  q  week.   Start 1500mg  calcium and 1000 U vit D qd. Recent calcium level 9.6.  Checking vit D level today. Plan rpt DEXA 1-2 yrs.  3) R lower abd wall hernia, suspect incisional hernia (her transplanted kidney is in RLQ). Asymptomatic. I advised her to contact her transplant surgeon to make appt.  4) CRI, hx of renal transplant. Recent sCr and GFR stable at 1.3 and 76ml/min respectively.  5) Anxiety: rare use of ativan 0.5mg , RF'd #30 with 1 RF today.  6) HTN: stable on hctz 25 qd, losartan 100 qd, cardura, and verapamil extended release.  An After Visit Summary was printed and given to the patient.  FOLLOW UP: Return in about 6 months (around 09/11/2021) for annual CPE (fasting).  Signed:  Crissie Sickles, MD           03/11/2021

## 2021-03-12 LAB — IRON,TIBC AND FERRITIN PANEL
%SAT: 30 % (calc) (ref 16–45)
Ferritin: 34 ng/mL (ref 16–288)
Iron: 104 ug/dL (ref 45–160)
TIBC: 342 mcg/dL (calc) (ref 250–450)

## 2021-03-19 DIAGNOSIS — N051 Unspecified nephritic syndrome with focal and segmental glomerular lesions: Secondary | ICD-10-CM | POA: Diagnosis not present

## 2021-03-19 DIAGNOSIS — Z940 Kidney transplant status: Secondary | ICD-10-CM | POA: Diagnosis not present

## 2021-03-19 DIAGNOSIS — B259 Cytomegaloviral disease, unspecified: Secondary | ICD-10-CM | POA: Diagnosis not present

## 2021-03-19 DIAGNOSIS — D849 Immunodeficiency, unspecified: Secondary | ICD-10-CM | POA: Diagnosis not present

## 2021-03-19 DIAGNOSIS — R1903 Right lower quadrant abdominal swelling, mass and lump: Secondary | ICD-10-CM | POA: Diagnosis not present

## 2021-03-19 DIAGNOSIS — Z79899 Other long term (current) drug therapy: Secondary | ICD-10-CM | POA: Diagnosis not present

## 2021-03-19 DIAGNOSIS — N2581 Secondary hyperparathyroidism of renal origin: Secondary | ICD-10-CM | POA: Diagnosis not present

## 2021-03-19 DIAGNOSIS — I10 Essential (primary) hypertension: Secondary | ICD-10-CM | POA: Diagnosis not present

## 2021-03-24 ENCOUNTER — Other Ambulatory Visit: Payer: Self-pay | Admitting: Internal Medicine

## 2021-03-24 DIAGNOSIS — R1903 Right lower quadrant abdominal swelling, mass and lump: Secondary | ICD-10-CM

## 2021-03-27 ENCOUNTER — Other Ambulatory Visit: Payer: Self-pay | Admitting: Family Medicine

## 2021-03-29 ENCOUNTER — Encounter: Payer: Self-pay | Admitting: Family Medicine

## 2021-03-29 MED ORDER — VERAPAMIL HCL ER 120 MG PO TBCR: EXTENDED_RELEASE_TABLET | ORAL | 1 refills | Status: DC

## 2021-04-08 DIAGNOSIS — K8020 Calculus of gallbladder without cholecystitis without obstruction: Secondary | ICD-10-CM

## 2021-04-08 HISTORY — DX: Calculus of gallbladder without cholecystitis without obstruction: K80.20

## 2021-04-13 ENCOUNTER — Other Ambulatory Visit: Payer: Self-pay

## 2021-04-13 ENCOUNTER — Ambulatory Visit
Admission: RE | Admit: 2021-04-13 | Discharge: 2021-04-13 | Disposition: A | Discharge status: Home / Self Care | Payer: Medicare Other | Source: Ambulatory Visit | Attending: Internal Medicine | Admitting: Internal Medicine

## 2021-04-13 DIAGNOSIS — K5730 Diverticulosis of large intestine without perforation or abscess without bleeding: Secondary | ICD-10-CM | POA: Diagnosis not present

## 2021-04-13 DIAGNOSIS — R1903 Right lower quadrant abdominal swelling, mass and lump: Secondary | ICD-10-CM

## 2021-04-13 DIAGNOSIS — K8020 Calculus of gallbladder without cholecystitis without obstruction: Secondary | ICD-10-CM | POA: Diagnosis not present

## 2021-04-13 IMAGING — CT CT ABD-PELV W/O CM
1 of 2 series · 14 of 32 positions shown, 18 images · non-contrast
Comparison: None.

CLINICAL DATA: Right lower quadrant abdominal swelling for several
months. History of renal cancer.

EXAM:
CT ABDOMEN AND PELVIS WITHOUT CONTRAST
TECHNIQUE: Multidetector CT imaging of the abdomen and pelvis was performed
following the standard protocol without IV contrast.

[Series 2: abd/pelvis w/(date) · axial · 0.93mm/px · z∈[-449,-44]mm · 14 of 91 slices shown, 18 images]
[im 5/91  soft-tissue]
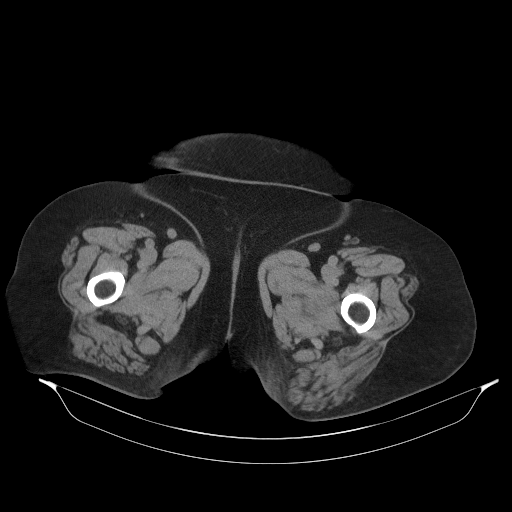
[im 5/91  bone]
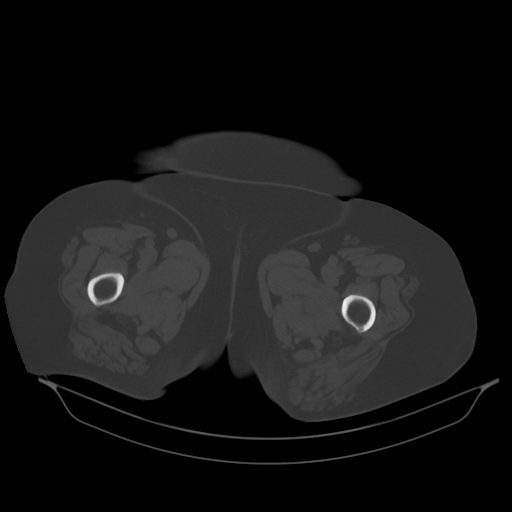
[im 13/91  soft-tissue]
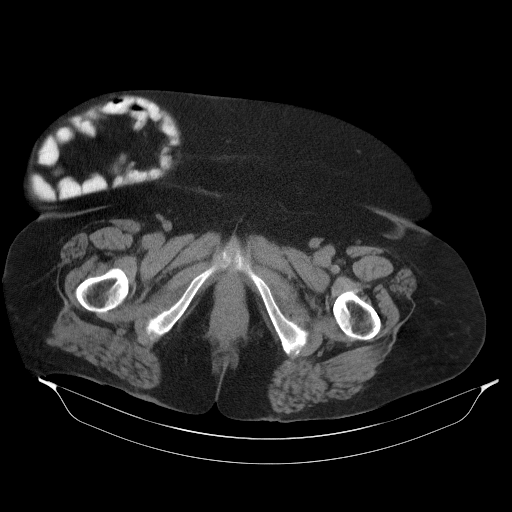
[im 22/91  soft-tissue]
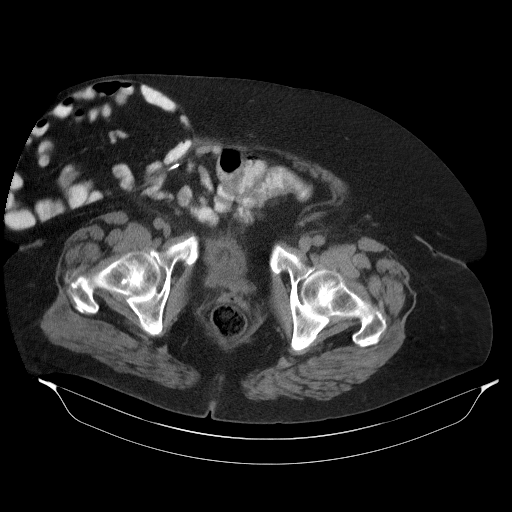
[im 26/91  soft-tissue]
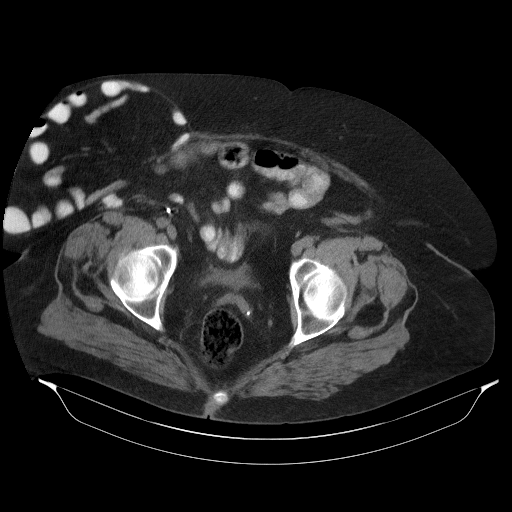
[im 35/91  soft-tissue]
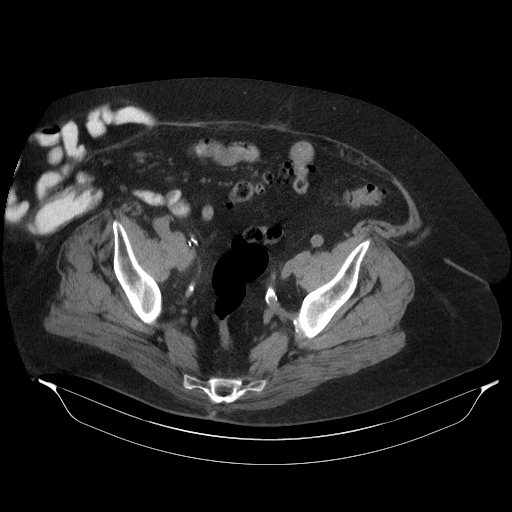
[im 43/91  soft-tissue]
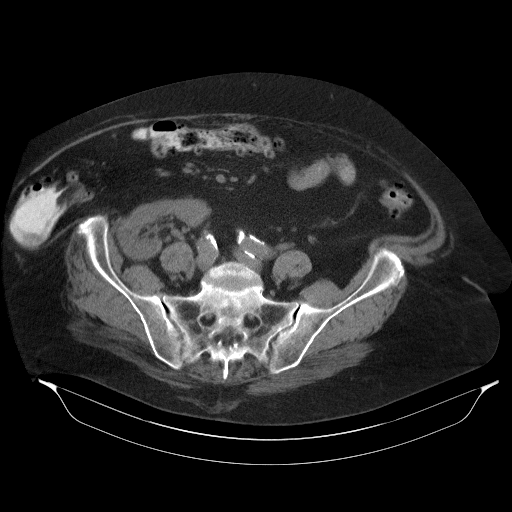
[im 48/91  soft-tissue]
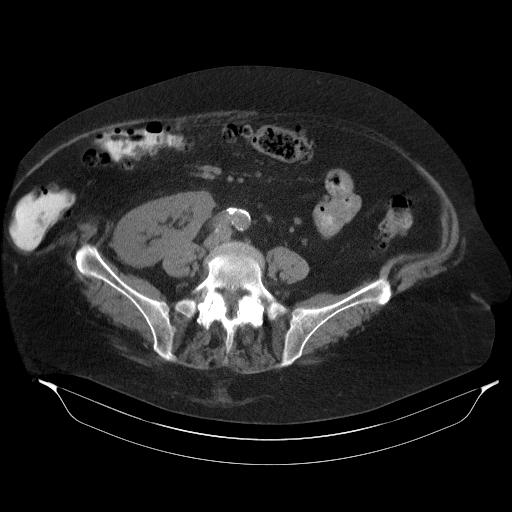
[im 56/91  soft-tissue]
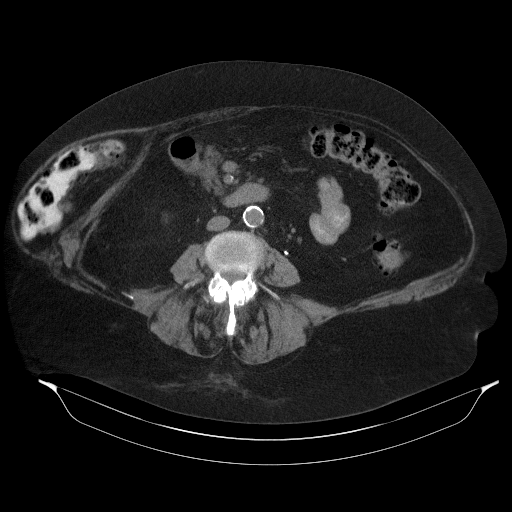
[im 65/91  soft-tissue]
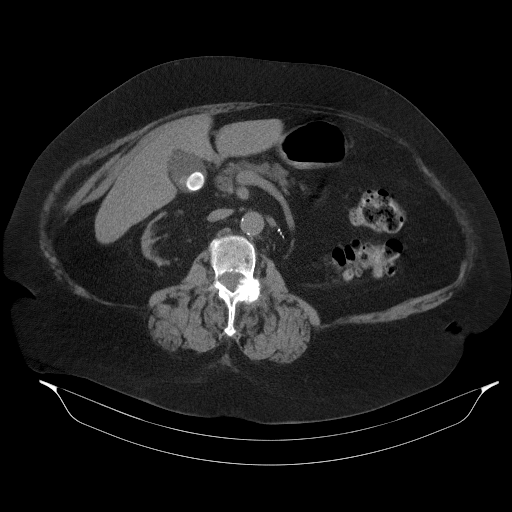
[im 65/91  bone]
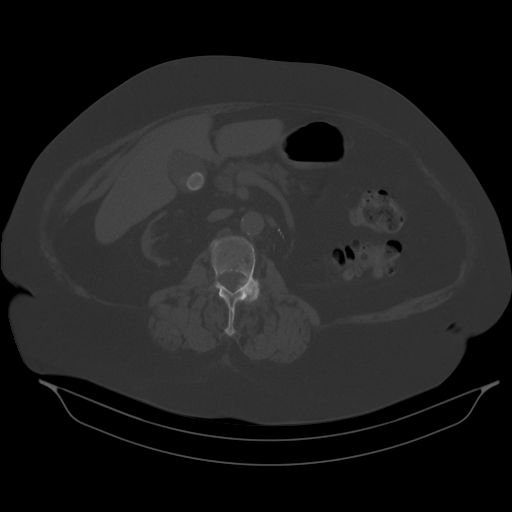
[im 69/91  soft-tissue]
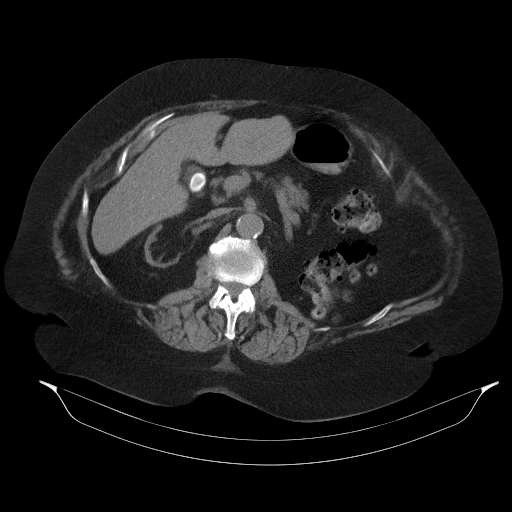
[im 73/91  lung]
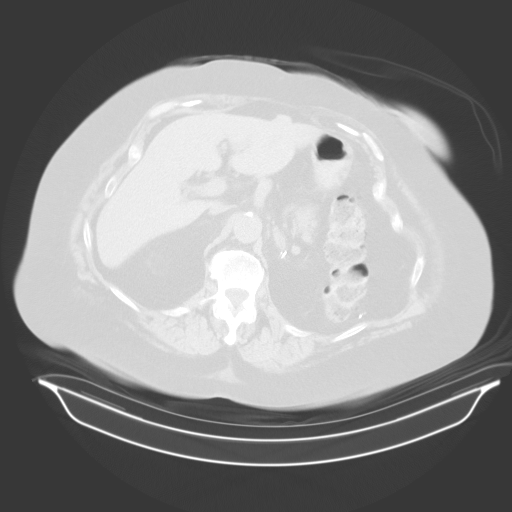
[im 78/91  soft-tissue]
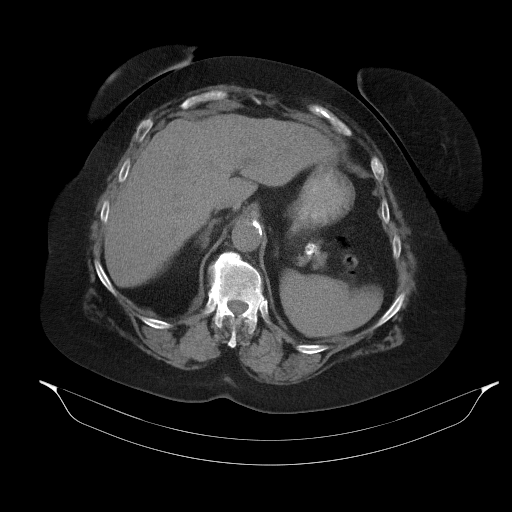
[im 78/91  lung]
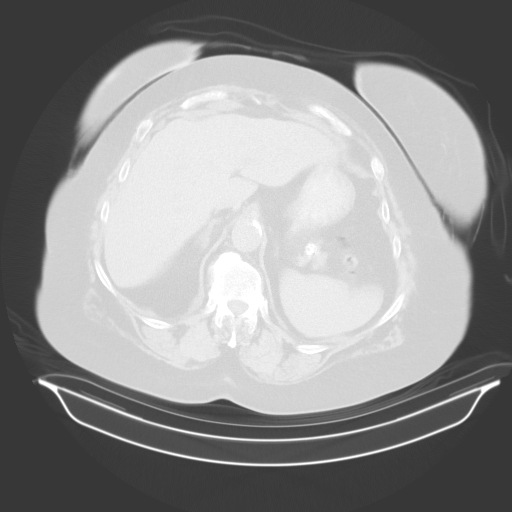
[im 82/91  lung]
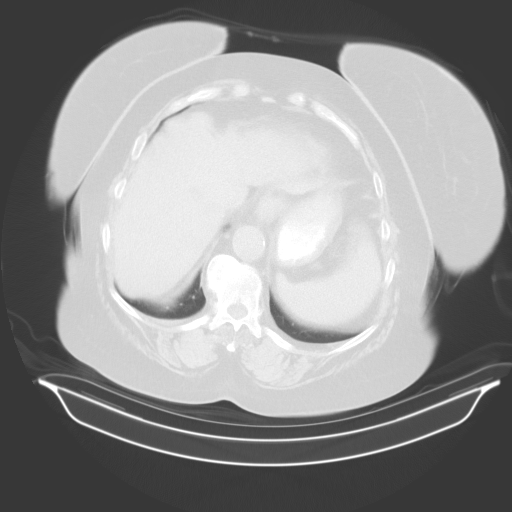
[im 86/91  soft-tissue]
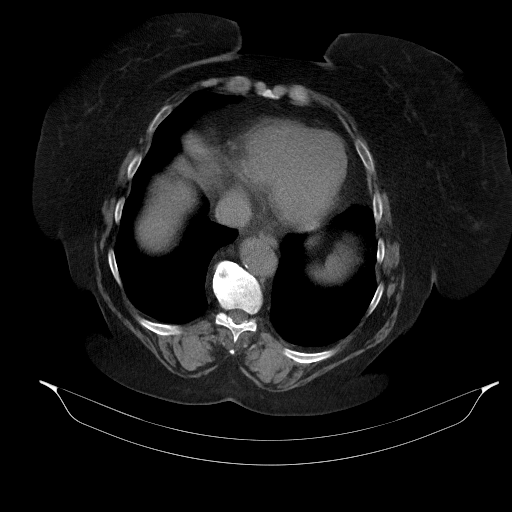
[im 86/91  lung]
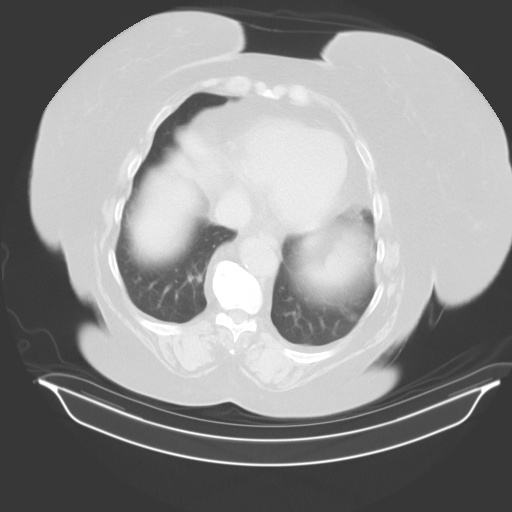

[14 of 32 positions shown; findings below may reference images not displayed]

FINDINGS: Lower chest: No acute abnormality.

Hepatobiliary: Cholelithiasis noted. No biliary dilatation is noted.
1.9 cm ill-defined low-density is noted in superior portion of right
hepatic lobe. 8 mm peripheral low-density is noted anteriorly in the
left hepatic lobe. Further evaluation with MRI with and without
gadolinium administration is recommended.

Pancreas: Unremarkable. No pancreatic ductal dilatation or
surrounding inflammatory changes.

Spleen: Normal in size without focal abnormality.

Adrenals/Urinary Tract: Adrenal glands appear unremarkable. Severe
right renal atrophy is noted status post left nephrectomy. 1.9 cm
right renal cyst is noted. Renal transplant is noted in right lower
quadrant without hydronephrosis. Urinary bladder is decompressed.

Stomach/Bowel: The stomach is unremarkable. There is no evidence of
bowel obstruction or inflammation. Diverticulosis is noted
throughout the colon. Large right lower quadrant ventral hernia is
noted which contains loops of small bowel, but does not result in
obstruction. The appendix appears normal.

Vascular/Lymphatic: Aortic atherosclerosis. No enlarged abdominal or
pelvic lymph nodes.

Reproductive: Status post hysterectomy. No adnexal masses.

Other: No abdominal wall hernia or abnormality. No abdominopelvic
ascites.

Musculoskeletal: No acute or significant osseous findings.
IMPRESSION: Large right lower quadrant ventral hernia is noted which contains
loops of small bowel, but does not result in bowel obstruction.

1.9 cm ill-defined low-density is noted in superior portion of right
hepatic lobe, as well as 8 mm peripheral low-density noted
anteriorly in the left hepatic lobe. Given the history of renal
carcinoma, further evaluation with MRI with and without gadolinium
administration is recommended to rule out metastatic disease.

Status post left nephrectomy. Renal transplant is noted in right
lower quadrant without hydronephrosis.

Cholelithiasis.

Aortic Atherosclerosis ([D9]-[D9]).

## 2021-04-23 ENCOUNTER — Other Ambulatory Visit (HOSPITAL_BASED_OUTPATIENT_CLINIC_OR_DEPARTMENT_OTHER): Payer: Self-pay | Admitting: Internal Medicine

## 2021-04-23 DIAGNOSIS — K769 Liver disease, unspecified: Secondary | ICD-10-CM

## 2021-04-28 ENCOUNTER — Encounter: Payer: Self-pay | Admitting: Family Medicine

## 2021-04-28 NOTE — Telephone Encounter (Signed)
FYI

## 2021-05-01 ENCOUNTER — Ambulatory Visit (HOSPITAL_BASED_OUTPATIENT_CLINIC_OR_DEPARTMENT_OTHER)
Admission: RE | Admit: 2021-05-01 | Discharge: 2021-05-01 | Disposition: A | Discharge status: Home / Self Care | Payer: Medicare Other | Source: Ambulatory Visit | Attending: Internal Medicine | Admitting: Internal Medicine

## 2021-05-01 ENCOUNTER — Other Ambulatory Visit: Payer: Self-pay

## 2021-05-01 DIAGNOSIS — K769 Liver disease, unspecified: Secondary | ICD-10-CM | POA: Insufficient documentation

## 2021-05-01 DIAGNOSIS — K8020 Calculus of gallbladder without cholecystitis without obstruction: Secondary | ICD-10-CM | POA: Diagnosis not present

## 2021-05-01 DIAGNOSIS — K7689 Other specified diseases of liver: Secondary | ICD-10-CM | POA: Diagnosis not present

## 2021-05-01 DIAGNOSIS — N261 Atrophy of kidney (terminal): Secondary | ICD-10-CM | POA: Diagnosis not present

## 2021-05-01 DIAGNOSIS — N186 End stage renal disease: Secondary | ICD-10-CM | POA: Diagnosis not present

## 2021-05-01 IMAGING — MR MR ABDOMEN WO/W CM
8 of 19 series · 16 of 48 positions shown · IV contrast (10 ML GADAVIST)
Comparison: CT AP [DATE]

CLINICAL DATA: Evaluate liver lesion.

EXAM:
MRI ABDOMEN WITHOUT AND WITH CONTRAST
TECHNIQUE: Multiplanar multisequence MR imaging of the abdomen was performed
both before and after the administration of intravenous contrast.
CONTRAST:  10mL GADAVIST GADOBUTROL 1 MMOL/ML IV SOLN

[Series 3: T2 · coronal · 8.0mm · 1.64mm/px · 1 of 29 slices shown (1 of 2)]
[im 1/29]
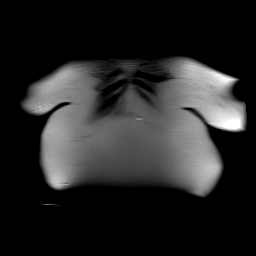

[Series 4: T2 fat-sat · axial · 7.0mm · 1.64mm/px · 1 of 33 slices shown]
[im 1/33]
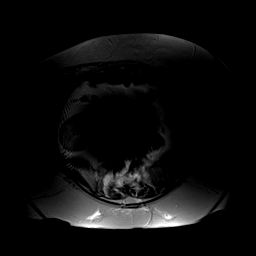

[Series 5: acr-in + out · axial · 6.0mm · 0.82mm/px · z∈[-126,+129]mm · 2 of 70 slices shown]
[im 1/70]
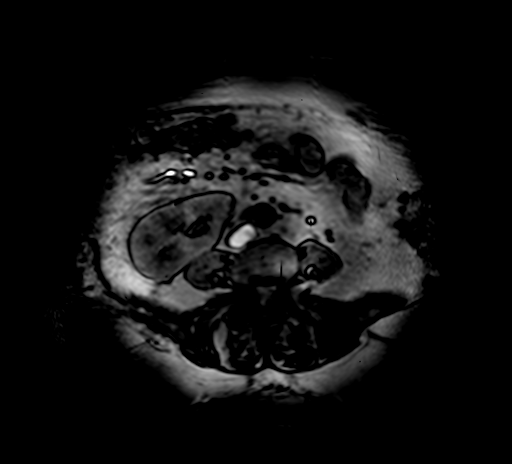
[im 70/70]
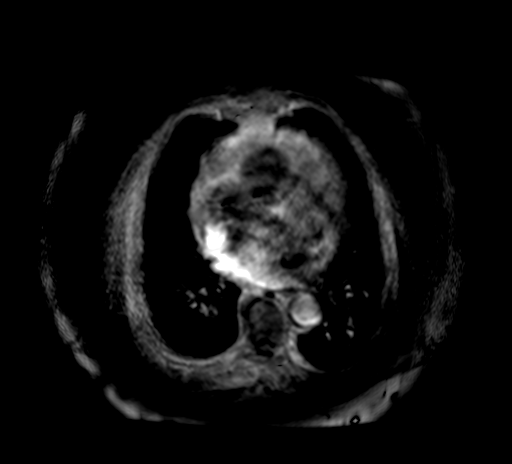

[Series 6: ep2d_[id number]_[id number] free breathing · axial · 6.0mm · 2.19mm/px · z∈[-125,+137]mm · 4 of 108 slices shown]
[im 1/108]
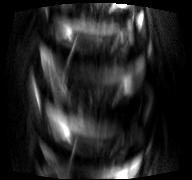
[im 36/108]
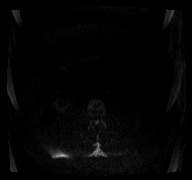
[im 72/108]
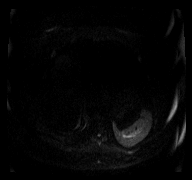
[im 108/108]
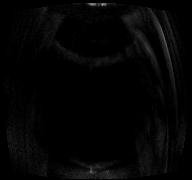

[Series 7: ep2d_[id number] free breathing_adc · axial · 6.0mm · 2.19mm/px · 1 of 36 slices shown]
[im 1/36]
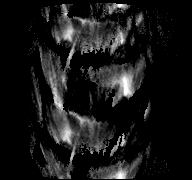

[Series 8: DWI · axial · 6.0mm · 0.78mm/px · z∈[-131,+132]mm · 2 of 36 slices shown]
[im 1/36]
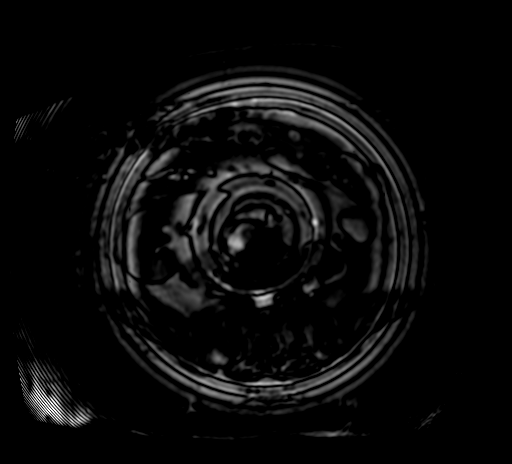
[im 36/36]
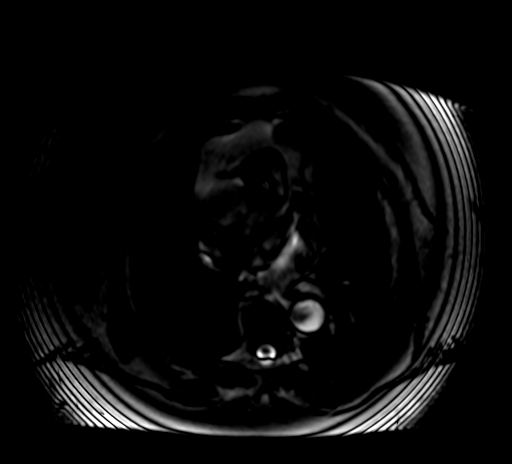

[Series 18: T2 · axial · 6.0mm · 1.56mm/px · z∈[-122,+125]mm · 2 of 34 slices shown (2 of 2)]
[im 1/34]
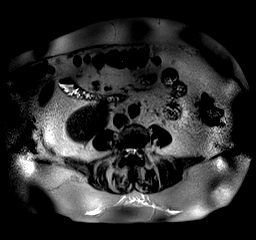
[im 34/34]
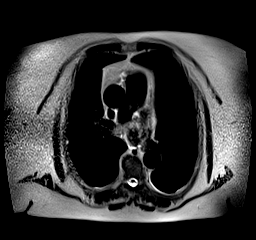

[Series 5003: sub_s19-s10_1 · axial · 4.4mm · 0.78mm/px · z∈[-129,+130]mm · 3 of 60 slices shown]
[im 1/60]
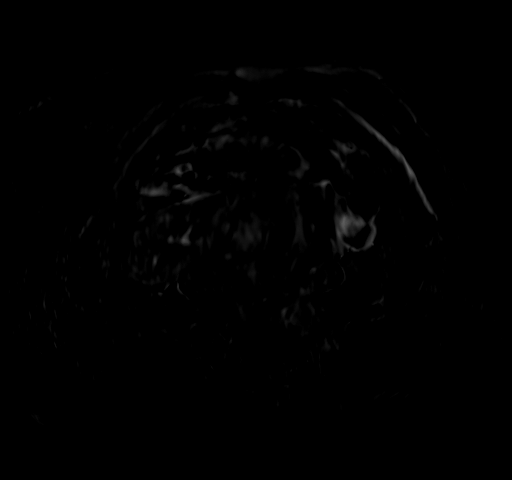
[im 30/60]
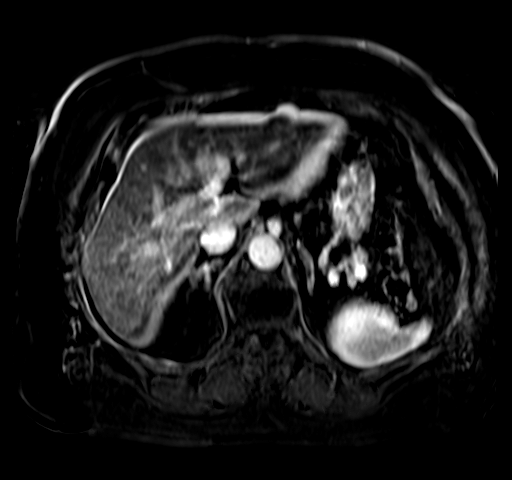
[im 60/60]
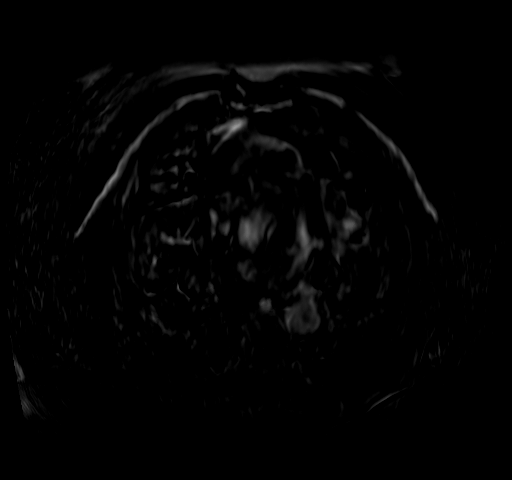

[16 of 48 positions shown; findings below may reference images not displayed]

FINDINGS: Lower chest: No acute findings.

Hepatobiliary: Exam detail is diminished secondary to motion
artifact and patient's body habitus. Corresponding to the CT
abnormality there is a T2 hyperintense and T1 hypointense structure
within the central aspect of right lobe of liver measuring 1.9 x
cm, image [DATE]. Within the limitations described above there is no
convincing evidence for enhancement within this structure and there
are no signs of restricted diffusion identified within this lesion.
Several benign appearing, nonenhancing cysts noted within left
hepatic lobe in and inferior right hepatic lobe. Gallstones measure
up to 1.5 cm. No bile duct dilatation.

Pancreas: No mass, inflammatory changes, or other parenchymal
abnormality identified.

Spleen:  Within normal limits in size and appearance.

Adrenals/Urinary Tract: Normal adrenal glands. Atrophic end-stage
right kidney is identified containing scattered liver cysts. The
largest arises off the lateral cortex measuring 2 cm. Partially
visualized right renal graft is noted within the right lower
quadrant of the abdomen.

Stomach/Bowel: Visualized portions within the abdomen are
unremarkable.

Vascular/Lymphatic: No pathologically enlarged lymph nodes
identified. No abdominal aortic aneurysm demonstrated.

Other:  None.

Musculoskeletal: No suspicious bone lesions identified.
IMPRESSION: 1. Exam detail is diminished secondary to motion artifact and
patient's body habitus.
2. Corresponding to the CT abnormality there is a T2 hyperintense
and T1 hypointense structure within the central aspect of the right
lobe of liver measuring 1.9 x 1.3 cm. This is favored to represent a
benign process such as liver cyst or biliary hamartoma. No
suspicious features identified at this time. However, given the
limitations of today's study, and in a patient that is at increased
risk, a follow-up examination in 3-6 months is recommended with
repeat MRI (only if the patient is able to remain motionless and
breath hold) or multiphase liver protocol CT.
3. Gallstones.
4. Atrophic end-stage right kidney containing scattered liver cysts.

## 2021-05-01 MED ORDER — GADOBUTROL 1 MMOL/ML IV SOLN
10.0000 mL | Freq: Once | INTRAVENOUS | Status: AC | PRN
  Administered 2021-05-01: 10 mL via INTRAVENOUS

## 2021-05-20 ENCOUNTER — Other Ambulatory Visit: Payer: Self-pay | Admitting: Family Medicine

## 2021-06-02 ENCOUNTER — Other Ambulatory Visit: Payer: Self-pay

## 2021-06-02 ENCOUNTER — Ambulatory Visit (INDEPENDENT_AMBULATORY_CARE_PROVIDER_SITE_OTHER): Payer: Medicare Other | Admitting: Family Medicine

## 2021-06-02 ENCOUNTER — Encounter: Payer: Self-pay | Admitting: Family Medicine

## 2021-06-02 VITALS — BP 127/80 | HR 74 | Temp 97.7°F | Ht 65.0 in | Wt 248.8 lb

## 2021-06-02 DIAGNOSIS — S80812A Abrasion, left lower leg, initial encounter: Secondary | ICD-10-CM

## 2021-06-02 DIAGNOSIS — Z23 Encounter for immunization: Secondary | ICD-10-CM

## 2021-06-02 NOTE — Progress Notes (Signed)
OFFICE VISIT  06/02/2021  CC:  Chief Complaint  Patient presents with   Leg injury    Left, scrape 2 weeks ago. Has been using Vaseline     HPI:    Patient is a 75 y.o. female who presents for "leg injury".  INTERIM HX: Tabitha Green is feeling well but has a concern about an abrasion on left shin.  A couple of weeks ago she bumped it on the step into a big truck.  Minimal bleeding, no bruising.  She says it has gradually improved.  No fevers chills or malaise.  Of note, since I last saw her-->CT abd/pelv 04/13/21 done for RLQ swelling (ventral hernia) by nephrologist--->incidental finding of small, ill-defined hepatic low density lesions x 2-->MR abd with and w/out recommended to r/o metastatic renal ca. Subsequently underwent MR abd w and w/out on 05/03/21: IMPRESSION: 1. Exam detail is diminished secondary to motion artifact and patient's body habitus. 2. Corresponding to the CT abnormality there is a T2 hyperintense and T1 hypointense structure within the central aspect of the right lobe of liver measuring 1.9 x 1.3 cm. This is favored to represent a benign process such as liver cyst or biliary hamartoma. No suspicious features identified at this time. However, given the limitations of today's study, and in a patient that is at increased risk, a follow-up examination in 3-6 months is recommended with repeat MRI (only if the patient is able to remain motionless and breath hold) or multiphase liver protocol CT. 3. Gallstones. 4. Atrophic end-stage right kidney containing scattered liver cysts.  Past Medical History:  Diagnosis Date   Anxiety    Cholelithiasis 04/2021   noted on CT abd/pelv done for R lower abd swelling   Chronic renal insufficiency, stage III (moderate) (HCC) 10/2015   Post-transplant baseline sCr 1.2-1.4.     CMV infection [facility]) summer 2017   Valcyte per ID/Renal transplant team   Diverticulosis    a. 05/2012 colonoscopy   Erosive lichen planus of vulva     Topical steroids (managed by Dr. Mila Palmer [facility] via [facility].   ESRD (end stage renal disease) ([place])    began dialysis 2014--followed by Dr. [name].  Received deceased donor kidney transplant 10/2015.   FSGS (focal segmental glomerulosclerosis)    right kidney; hx of left renal cell cancer and got nephrectomy 1993.   GERD (gastroesophageal reflux disease)    Gout    Heart murmur    (Diastolic) ECHO 01/5992 showed that this murmur is coming from pt's R arm AV fistula   Hiatal hernia    History of renal cell cancer 1993   Left nephrectomy   Hyperlipidemia    Hypertension    Hypothyroidism    Impingement syndrome of left shoulder 04/2016   Dr. Christy Sartorius to PT   Lumbar spinal stenosis    Chronic LBP with bilat neurogenic claudication   Metatarsal fracture, pathologic 01/2018   Right; orthocarolina-->post op shoe continued, f/u x-ray planned.   Osteoarthritis of left knee 08/2017   severe, diffuse, tricompartmental.  Responded to steroid injection.     Osteoporosis 06/07/2016   DEXA T-score of -3.1.  Fosamax planned 2017 but dental work prevented start..  Pathologic toe fx 12/2017--repeat DEXA 08/2018, T-score -3.3 radius. 12/2020 T score radius -3.7.   Renal transplant recipient 11/06/2015   Baseline Cr as of 09/2020 1.2-1.4   SVT (supraventricular tachycardia) (HCC)    Vitamin B12 deficiency 12/2018   Starting replacement as of 12/11/2018  Past Surgical History:  Procedure Laterality Date   AV FISTULA PLACEMENT Right    Right arm: aneurismal dilatation 2017 being followed by CV surgeons   CARDIOVASCULAR STRESS TEST     03/10/15 ETT ([facility]): Exercise ECG negative at 83% max predicted HR.    CATARACT EXTRACTION     COLONOSCOPY  2003; 05/2012   diverticulosis, no polyps.  Recall 10 yrs   colonoscopy with polypectomy     Dr Henrene Pastor   DEXA  06/07/2016; 08/22/2018; 12/30/20   2017 T-score -3.1.  2020 T score -3.3.  2022 T score -3.7   KIDNEY  TRANSPLANT  2015-11-19   Deceased donor kidney transplant, with Thymoglobulin induction   LIGATION OF ARTERIOVENOUS  FISTULA Right 02/14/2017   Procedure: LIGATION OF ARTERIOVENOUS  FISTULA;  Surgeon: Angelia Mould, MD;  Location: [place];  Service: Vascular;  Laterality: Right;   [place]   for malignancy- left   RENAL BIOPSY     right   RESECTION OF ARTERIOVENOUS FISTULA ANEURYSM Right 02/14/2017   Procedure: EXCISION OF RIGHT BRACHIOCEPHALIC ARTERIOVENOUS FISTULA ANEURYSM;  Surgeon: Angelia Mould, MD;  Location: [place];  Service: Vascular;  Laterality: Right;   TEE WITHOUT CARDIOVERSION N/A 08/27/2013   Procedure: TRANSESOPHAGEAL ECHOCARDIOGRAM (TEE);  Surgeon: [name], MD;  Location: [facility] ENDOSCOPY;  Service: Cardiovascular;  Laterality: N/A;   TOTAL ABDOMINAL HYSTERECTOMY W/ BILATERAL SALPINGOOPHORECTOMY  1997   fibroids   TRANSTHORACIC ECHOCARDIOGRAM     03/10/15 echo ([facility], [facility]): LV cavity normal in size, focal basal hypertrophy, normal systolic function, EF 08% (visual est). Normal wall motion, no regional wall motion abnormalities. Mild diastolic dysfunction with normal LA chamber size. No significant valve stenosis or regurgitation.   VULVA / PERINEUM BIOPSY  2015    Outpatient Medications Prior to Visit  Medication Sig Dispense Refill   aspirin EC 81 MG EC tablet Take 1 tablet (81 mg total) by mouth daily.     AYR SALINE NASAL DROPS NA Place 2 sprays into both nostrils daily.     Camphor-Eucalyptus-Menthol (VICKS VAPORUB EX) Apply 1 application topically daily.     Cyanocobalamin (B-12 PO) Take by mouth daily.     doxazosin (CARDURA) 2 MG tablet TAKE 1 TABLET BY MOUTH  TWICE DAILY 180 tablet 1   hydrochlorothiazide (HYDRODIURIL) 25 MG tablet TAKE 1 TABLET BY MOUTH  DAILY 90 tablet 3   levothyroxine (SYNTHROID) 125 MCG tablet TAKE 1 TABLET BY MOUTH  DAILY 90 tablet 3   LORazepam (ATIVAN) 0.5 MG tablet Take 1 tablet (0.5 mg total) by  mouth 2 (two) times daily as needed for anxiety. 30 tablet 1   losartan (COZAAR) 100 MG tablet TAKE 1 TABLET BY MOUTH  DAILY 90 tablet 1   Magnesium Oxide 500 MG TABS Take 1 tablet by mouth daily.     meclizine (ANTIVERT) 25 MG tablet Take 1 tablet (25 mg total) by mouth 3 (three) times daily as needed for dizziness or nausea. 20 tablet 0   mycophenolate (MYFORTIC) 360 MG TBEC EC tablet Take 360 mg by mouth 2 (two) times daily.     SODIUM BICARBONATE PO Take 650 mg by mouth 2 (two) times daily. Takes 1 in AM and 1 at bedtime     tacrolimus (PROGRAF) 1 MG capsule Take 4 mg by mouth 2 (two) times daily. Pt takes 3 in the morning and 3 at night.     triamcinolone (NASACORT) 55 MCG/ACT AERO nasal inhaler Place  2 sprays into the nose daily.     triamcinolone cream (KENALOG) 0.1 % Apply 1 application topically daily.     verapamil (CALAN-SR) 120 MG CR tablet TAKE 1 TABLET BY MOUTH  EVERY AFTERNOON 90 tablet 1   verapamil (CALAN-SR) 240 MG CR tablet TAKE 1 TABLET BY MOUTH IN  THE MORNING 90 tablet 3   alendronate (FOSAMAX) 70 MG tablet Take 1 tablet (70 mg total) by mouth every 7 (seven) days. Take with a full glass of water on an empty stomach. (Patient not taking: Reported on 06/02/2021) 12 tablet 3   silver sulfADIAZINE (SILVADENE) 1 % cream Apply 1 application topically daily. Mixes with Triamcinolone cream (Patient not taking: Reported on 06/02/2021)     sulfamethoxazole-trimethoprim (BACTRIM) 400-80 MG tablet Take 1 tablet by mouth daily. (Patient not taking: Reported on 06/02/2021)     No facility-administered medications prior to visit.    Allergies  Allergen Reactions   Labetalol     Other reaction(s): Other   Cefaclor Rash   Naproxen Rash   Enalapril Maleate Cough    Vasotec   Metoprolol Tartrate Rash    On legs    ROS As per HPI  PE: Vitals with BMI 06/02/2021 03/11/2021 12/05/2020  Height 5\' 5"  5\' 5"  5\' 5"   Weight 248 lbs 13 oz 245 lbs 240 lbs  BMI 41.4 78.58 85.02  Systolic  774 128 786  Diastolic 80 69 85  Pulse 74 74 68   Gen: Alert, well appearing.  Patient is oriented to person, place, time, and situation. AFFECT: pleasant, lucid thought and speech. L shin with approx 1 cm round scab, minimal surrounding pink hue to periphery.  No erythema, no induration or moisture, no tenderness or swelling.  No ecchymoses.  LABS:    Chemistry      Component Value Date/Time   NA 139 09/30/2020 0000   K 4.5 09/30/2020 0000   CL 105 09/30/2020 0000   CO2 27 (A) 09/30/2020 0000   BUN 24 (A) 09/30/2020 0000   CREATININE 1.3 (A) 09/30/2020 0000   CREATININE 1.22 (H) 03/28/2019 0952   CREATININE 3.61 (H) 09/16/2015 1515   GLU 102 09/30/2020 0000      Component Value Date/Time   CALCIUM 9.4 09/30/2020 0000   CALCIUM 8.4 08/30/2012 0819   ALKPHOS 87 03/02/2020 0000   AST 16 03/02/2020 0000   ALT 13 03/02/2020 0000   BILITOT 0.6 02/18/2019 0948     Lab Results  Component Value Date   WBC 5.9 09/30/2020   HGB 12.5 09/30/2020   HCT 39 03/03/2020   MCV 92.8 02/18/2019   PLT 280 09/30/2020   Lab Results  Component Value Date   CHOL 152 11/18/2019   HDL 41 11/18/2019   LDLCALC 90 11/18/2019   LDLDIRECT 166.3 10/02/2007   TRIG 117 11/18/2019   CHOLHDL 4 02/18/2019    IMPRESSION AND PLAN:  #1 left shin abrasion: A little slow to completely heal but no sign of infection and it is gradually getting better.  Reassurance.   #2 liver cyst versus biliary hamartoma. As per radiology recommendations we will plan on repeating MRI abdomen with and without contrast early next year.  An After Visit Summary was printed and given to the patient.  FOLLOW UP: No follow-ups on file.  Signed:  Crissie Sickles, MD           06/02/2021

## 2021-06-17 DIAGNOSIS — Z940 Kidney transplant status: Secondary | ICD-10-CM | POA: Diagnosis not present

## 2021-06-17 DIAGNOSIS — N390 Urinary tract infection, site not specified: Secondary | ICD-10-CM | POA: Diagnosis not present

## 2021-06-22 DIAGNOSIS — N051 Unspecified nephritic syndrome with focal and segmental glomerular lesions: Secondary | ICD-10-CM | POA: Diagnosis not present

## 2021-06-22 DIAGNOSIS — Z940 Kidney transplant status: Secondary | ICD-10-CM | POA: Diagnosis not present

## 2021-06-22 DIAGNOSIS — Z79899 Other long term (current) drug therapy: Secondary | ICD-10-CM | POA: Diagnosis not present

## 2021-06-22 DIAGNOSIS — I10 Essential (primary) hypertension: Secondary | ICD-10-CM | POA: Diagnosis not present

## 2021-06-22 DIAGNOSIS — R1903 Right lower quadrant abdominal swelling, mass and lump: Secondary | ICD-10-CM | POA: Diagnosis not present

## 2021-06-22 DIAGNOSIS — K769 Liver disease, unspecified: Secondary | ICD-10-CM | POA: Diagnosis not present

## 2021-06-22 DIAGNOSIS — N2581 Secondary hyperparathyroidism of renal origin: Secondary | ICD-10-CM | POA: Diagnosis not present

## 2021-06-22 DIAGNOSIS — D849 Immunodeficiency, unspecified: Secondary | ICD-10-CM | POA: Diagnosis not present

## 2021-06-22 DIAGNOSIS — B259 Cytomegaloviral disease, unspecified: Secondary | ICD-10-CM | POA: Diagnosis not present

## 2021-06-22 DIAGNOSIS — N390 Urinary tract infection, site not specified: Secondary | ICD-10-CM | POA: Diagnosis not present

## 2021-07-20 DIAGNOSIS — L821 Other seborrheic keratosis: Secondary | ICD-10-CM | POA: Diagnosis not present

## 2021-07-20 DIAGNOSIS — B079 Viral wart, unspecified: Secondary | ICD-10-CM | POA: Diagnosis not present

## 2021-07-20 DIAGNOSIS — L570 Actinic keratosis: Secondary | ICD-10-CM | POA: Diagnosis not present

## 2021-07-20 DIAGNOSIS — L438 Other lichen planus: Secondary | ICD-10-CM | POA: Diagnosis not present

## 2021-07-20 DIAGNOSIS — L304 Erythema intertrigo: Secondary | ICD-10-CM | POA: Diagnosis not present

## 2021-07-28 ENCOUNTER — Ambulatory Visit (INDEPENDENT_AMBULATORY_CARE_PROVIDER_SITE_OTHER): Payer: Medicare Other

## 2021-07-28 ENCOUNTER — Other Ambulatory Visit: Payer: Self-pay

## 2021-07-28 ENCOUNTER — Ambulatory Visit: Payer: Medicare Other

## 2021-07-28 DIAGNOSIS — Z0000 Encounter for general adult medical examination without abnormal findings: Secondary | ICD-10-CM

## 2021-07-28 NOTE — Progress Notes (Addendum)
Virtual Visit via Telephone Note  I connected with  Tabitha Green on 07/28/21 at  8:45 AM EST by telephone and verified that I am speaking with the correct person using two identifiers.  Medicare Annual Wellness visit completed telephonically due to Covid-19 pandemic.   Persons participating in this call: This Health Coach and this patient.   Location: Patient: Home Provider: Office   I discussed the limitations, risks, security and privacy concerns of performing an evaluation and management service by telephone and the availability of in person appointments. The patient expressed understanding and agreed to proceed.  Unable to perform video visit due to video visit attempted and failed and/or patient does not have video capability.   Some vital signs may be absent or patient reported.   Tabitha Brace, LPN   Subjective:   Tabitha Green is a 75 y.o. female who presents for Medicare Annual (Subsequent) preventive examination.  Review of Systems     Cardiac Risk Factors include: advanced age (>64men, >48 women);hypertension;dyslipidemia;obesity (BMI >30kg/m2)     Objective:    There were no vitals filed for this visit. There is no height or weight on file to calculate BMI.  Advanced Directives 07/28/2021 07/22/2020 06/17/2019 02/12/2019 06/11/2018 06/06/2017 03/15/2017  Does Patient Have a Medical Advance Directive? Yes Yes Yes Yes Yes No No  Type of Paramedic of [place];Living will Living will;Healthcare Power of Tabitha Green;Living will Living will;Healthcare Power of Attorney - -  Copy of [place] in Chart? No - copy requested No - copy requested No - copy requested No - copy requested No - copy requested - -  Would patient like information on creating a medical advance directive? - - - - - No - Patient declined -  Pre-existing out of facility DNR order (yellow form or pink MOST  form) - - - - - - -    Current Medications (verified) Outpatient Encounter Medications as of 07/28/2021  Medication Sig   aspirin EC 81 MG EC tablet Take 1 tablet (81 mg total) by mouth daily.   AYR SALINE NASAL DROPS NA Place 2 sprays into both nostrils daily.   Camphor-Eucalyptus-Menthol (VICKS VAPORUB EX) Apply 1 application topically daily.   Cyanocobalamin (B-12 PO) Take by mouth daily.   doxazosin (CARDURA) 2 MG tablet TAKE 1 TABLET BY MOUTH  TWICE DAILY   hydrochlorothiazide (HYDRODIURIL) 25 MG tablet TAKE 1 TABLET BY MOUTH  DAILY   levothyroxine (SYNTHROID) 125 MCG tablet TAKE 1 TABLET BY MOUTH  DAILY   LORazepam (ATIVAN) 0.5 MG tablet Take 1 tablet (0.5 mg total) by mouth 2 (two) times daily as needed for anxiety.   losartan (COZAAR) 100 MG tablet TAKE 1 TABLET BY MOUTH  DAILY   Magnesium Oxide 500 MG TABS Take 1 tablet by mouth daily.   mycophenolate (MYFORTIC) 360 MG TBEC EC tablet Take 360 mg by mouth 2 (two) times daily.   nystatin (MYCOSTATIN/NYSTOP) powder Apply daily to breast and abdominal folds after shower   SODIUM BICARBONATE PO Take 650 mg by mouth 2 (two) times daily. Takes 1 in AM and 1 at bedtime   tacrolimus (PROGRAF) 1 MG capsule Take 4 mg by mouth 2 (two) times daily. Pt takes 3 in the morning and 3 at night.   triamcinolone (NASACORT) 55 MCG/ACT AERO nasal inhaler Place 2 sprays into the nose daily.   triamcinolone cream (KENALOG) 0.1 % Apply 1 application topically  daily.   verapamil (CALAN-SR) 120 MG CR tablet TAKE 1 TABLET BY MOUTH  EVERY AFTERNOON   verapamil (CALAN-SR) 240 MG CR tablet TAKE 1 TABLET BY MOUTH IN  THE MORNING   alendronate (FOSAMAX) 70 MG tablet Take 1 tablet (70 mg total) by mouth every 7 (seven) days. Take with a full glass of water on an empty stomach. (Patient not taking: Reported on 06/02/2021)   meclizine (ANTIVERT) 25 MG tablet Take 1 tablet (25 mg total) by mouth 3 (three) times daily as needed for dizziness or nausea. (Patient not  taking: Reported on 07/28/2021)   No facility-administered encounter medications on file as of 07/28/2021.    Allergies (verified) Labetalol, Cefaclor, Naproxen, Enalapril maleate, and Metoprolol tartrate   History: Past Medical History:  Diagnosis Date   Anxiety    Cholelithiasis 04/2021   noted on CT abd/pelv done for R lower abd swelling   Chronic renal insufficiency, stage III (moderate) ([place]) 10/2015   Post-transplant baseline sCr 1.2-1.4.     CMV infection [facility]) summer 2017   Valcyte per ID/Renal transplant team   Diverticulosis    a. 05/2012 colonoscopy   Erosive lichen planus of vulva    Topical steroids (managed by Dr. Mila Palmer [facility] via [facility] out[facility] services.   ESRD (end stage renal disease) ([place])    began dialysis 2014--followed by Dr. [name].  Received deceased donor kidney transplant 10/2015.   FSGS (focal segmental glomerulosclerosis)    right kidney; hx of left renal cell cancer and got nephrectomy 1993.   GERD (gastroesophageal reflux disease)    Gout    Heart murmur    (Diastolic) ECHO 08/4429 showed that this murmur is coming from pt's R arm AV fistula   Hiatal hernia    History of renal cell cancer 1993   Left nephrectomy   Hyperlipidemia    Hypertension    Hypothyroidism    Impingement syndrome of left shoulder 04/2016   Dr. Christy Sartorius to PT   Lumbar spinal stenosis    Chronic LBP with bilat neurogenic claudication   Metatarsal fracture, pathologic 01/2018   Right; orthocarolina-->post op shoe continued, f/u x-ray planned.   Osteoarthritis of left knee 08/2017   severe, diffuse, tricompartmental.  Responded to steroid injection.     Osteoporosis 06/07/2016   DEXA T-score of -3.1.  Fosamax planned 2017 but dental work prevented start..  Pathologic toe fx 12/2017--repeat DEXA 08/2018, T-score -3.3 radius. 12/2020 T score radius -3.7.   Renal transplant recipient 11/07/2015   Baseline Cr as of 09/2020 1.2-1.4   SVT  (supraventricular tachycardia) (HCC)    Vitamin B12 deficiency 12/2018   Starting replacement as of 12/11/2018   Past Surgical History:  Procedure Laterality Date   AV FISTULA PLACEMENT Right    Right arm: aneurismal dilatation 2017 being followed by CV surgeons   CARDIOVASCULAR STRESS TEST     03/10/15 ETT ([facility]): Exercise ECG negative at 83% max predicted HR.    CATARACT EXTRACTION     COLONOSCOPY  2003; 05/2012   diverticulosis, no polyps.  Recall 10 yrs   colonoscopy with polypectomy     Dr Henrene Pastor   DEXA  06/07/2016; 08/22/2018; 12/30/20   2017 T-score -3.1.  2020 T score -3.3.  2022 T score -3.7   KIDNEY TRANSPLANT  2015-11-07   Deceased donor kidney transplant, with Thymoglobulin induction   LIGATION OF ARTERIOVENOUS  FISTULA Right 02/14/2017   Procedure: LIGATION OF ARTERIOVENOUS  FISTULA;  Surgeon: Angelia Mould,  MD;  Location: [place];  Service: Vascular;  Laterality: Right;   [place]   for malignancy- left   RENAL BIOPSY     right   RESECTION OF ARTERIOVENOUS FISTULA ANEURYSM Right 02/14/2017   Procedure: EXCISION OF RIGHT BRACHIOCEPHALIC ARTERIOVENOUS FISTULA ANEURYSM;  Surgeon: Angelia Mould, MD;  Location: [place];  Service: Vascular;  Laterality: Right;   TEE WITHOUT CARDIOVERSION N/A 08/27/2013   Procedure: TRANSESOPHAGEAL ECHOCARDIOGRAM (TEE);  Surgeon: [name], MD;  Location: [facility];  Service: Cardiovascular;  Laterality: N/A;   TOTAL ABDOMINAL HYSTERECTOMY W/ BILATERAL SALPINGOOPHORECTOMY  1997   fibroids   TRANSTHORACIC ECHOCARDIOGRAM     03/10/15 echo ([facility], [facility]): LV cavity normal in size, focal basal hypertrophy, normal systolic function, EF 66% (visual est). Normal wall motion, no regional wall motion abnormalities. Mild diastolic dysfunction with normal LA chamber size. No significant valve stenosis or regurgitation.   VULVA / PERINEUM BIOPSY  2015   Family History  Problem Relation Age of Onset   Deep vein  thrombosis Mother        post thyroid surgery   Hypertension Mother    Heart attack Father 7       deceased   Hypertension Father    Cancer Sister        breast   Cancer Paternal Aunt        pancreatic   Diabetes Maternal Grandfather    Stroke Paternal Grandmother        in 19s   Fibroids Daughter    Colon cancer Neg Hx    Esophageal cancer Neg Hx    Stomach cancer Neg Hx    Rectal cancer Neg Hx    Social History   Socioeconomic History   Marital status: Married    Spouse name: Not on file   Number of children: 2   Years of education: Not on file   Highest education level: Not on file  Occupational History   Occupation: Retired  Tobacco Use   Smoking status: Never   Smokeless tobacco: Never  Vaping Use   Vaping Use: Never used  Substance and Sexual Activity   Alcohol use: Yes    Comment: rarely   Drug use: No   Sexual activity: Not on file  Other Topics Concern   Not on file  Social History Narrative   Lives in [place] with husband.  Retired.  Previously worked in [employer] @ [employer]   No T/A/Ds.   Social Determinants of Health   Financial Resource Strain: Low Risk    Difficulty of Paying Living Expenses: Not hard at all  Food Insecurity: No Food Insecurity   Worried About Charity fundraiser in the Last Year: Never true   [place] in the Last Year: Never true  Transportation Needs: No Transportation Needs   Lack of Transportation (Medical): No   Lack of Transportation (Non-Medical): No  Physical Activity: Inactive   Days of Exercise per Week: 0 days   Minutes of Exercise per Session: 0 min  Stress: No Stress Concern Present   Feeling of Stress : Only a little  Social Connections: Moderately Integrated   Frequency of Communication with Friends and Family: More than three times a week   Frequency of Social Gatherings with Friends and Family: More than three times a week   Attends Religious Services: More than 4 times per year   Active Member  of Clubs or Organizations: No  Attends Archivist Meetings: Never   Marital Status: Married    Tobacco Counseling Counseling given: Not Answered   Clinical Intake:  Pre-visit preparation completed: Yes  Pain : No/denies pain     BMI - recorded: 41.4 Nutritional Status: BMI > 30  Obese Nutritional Risks: None Diabetes: No  How often do you need to have someone help you when you read instructions, pamphlets, or other written materials from your doctor or pharmacy?: 1 - Never  Diabetic?No  Interpreter Needed?: No  Information entered by :: Charlott Rakes, LPN   Activities of Daily Living In your present state of health, do you have any difficulty performing the following activities: 07/28/2021  Hearing? N  Vision? N  Difficulty concentrating or making decisions? N  Walking or climbing stairs? N  Dressing or bathing? N  Doing errands, shopping? N  Preparing Food and eating ? N  Using the Toilet? N  In the past six months, have you accidently leaked urine? Y  Comment at times  Do you have problems with loss of bowel control? N  Managing your Medications? N  Managing your Finances? N  Housekeeping or managing your Housekeeping? N  Some recent data might be hidden    Patient Care Team: Tammi Sou, MD as PCP - General (Family Medicine) Irene Shipper, MD as Consulting Physician (Gastroenterology) Dene Gentry, MD as Consulting Physician (Sports Medicine) Elam Dutch, MD as Consulting Physician (Vascular Surgery) Angelia Mould, MD as Consulting Physician (Vascular Surgery) Florinda Marker as Physician Assistant (Orthopedic Surgery) Pichardo-[facility]er, Mila Palmer, MD as Consulting Physician (Dermatology) Santiago Glad, PA-C as Consulting Physician (Orthopedic Surgery) Evans Lance, MD as Consulting Physician (Cardiology) Justin Mend, MD as Consulting Physician (Nephrology) Silverio Decamp, MD as Consulting  Physician (Family Medicine) Leonia Corona, MD as Referring Physician (Ophthalmology) Cheron Every, MD as Consulting Physician (Transplant)  Indicate any recent Medical Services you may have received from other than [facility] providers in the past year (date may be approximate).     Assessment:   This is a routine wellness examination for Cartha.  Hearing/Vision screen Hearing Screening - Comments:: Pt denies any hearing issues  Vision Screening - Comments:: Pt follows up with Dr Greer Pickerel for annual eye exams   Dietary issues and exercise activities discussed: Current Exercise Habits: The patient does not participate in regular exercise at present   Goals Addressed             This Visit's Progress    Patient Stated       Lose weight        Depression Screen PHQ 2/9 Scores 07/28/2021 06/02/2021 07/22/2020 06/17/2019 12/24/2018 06/11/2018 06/06/2017  PHQ - 2 Score 0 0 0 0 0 0 0  PHQ- 9 Score - - - - 0 - -    Fall Risk Fall Risk  07/28/2021 06/02/2021 07/22/2020 06/17/2019 12/24/2018  Falls in the past year? 0 0 0 0 0  Number falls in past yr: 0 0 0 0 0  Injury with Fall? 0 0 0 0 0  Risk for fall due to : Impaired balance/gait;Impaired vision - - - -  Follow up Falls prevention discussed Falls evaluation completed Falls prevention discussed Falls prevention discussed Falls evaluation completed    FALL RISK PREVENTION PERTAINING TO THE HOME:  Any stairs in or around the home? Yes  If so, are there any without handrails? Yes  Home free of loose throw rugs in walkways, pet beds,  electrical cords, etc? Yes  Adequate lighting in your home to reduce risk of falls? Yes   ASSISTIVE DEVICES UTILIZED TO PREVENT FALLS:  Life alert? No  Use of a cane, walker or w/c? No  Grab bars in the bathroom? Yes  Shower chair or bench in shower? Yes  Elevated toilet seat or a handicapped toilet? No   TIMED UP AND GO:  Was the test performed? No .   Cognitive Function: MMSE - Mini Mental  State Exam 06/17/2019 06/11/2018 06/06/2017  Orientation to time 5 5 5   Orientation to Place 5 5 5   Registration 3 3 3   Attention/ Calculation 5 5 5   Recall 2 2 3   Language- name 2 objects 2 2 2   Language- repeat 1 1 1   Language- follow 3 step command 3 3 3   Language- read & follow direction 1 1 1   Write a sentence 1 1 1   Copy design 1 1 1   Total score 29 29 30      6CIT Screen 07/28/2021  What Year? 0 points  What month? 0 points  What time? 0 points  Count back from 20 0 points  Months in reverse 2 points  Repeat phrase 0 points  Total Score 2    Immunizations Immunization History  Administered Date(s) Administered   Fluad Quad(high Dose 65+) 05/21/2019, 06/02/2021   Influenza Split 06/01/2011   Influenza Whole 06/11/2008, 05/25/2009, 06/09/2010   Influenza, High Dose Seasonal PF 05/08/2013, 06/06/2017, 05/08/2020   Influenza-Unspecified 05/18/2015, 05/15/2018   PFIZER(Purple Top)SARS-COV-2 Vaccination 08/29/2019, 09/19/2019, 06/08/2020   PPD Test 06/01/2011   Pneumococcal Conjugate-13 06/06/2017   Pneumococcal Polysaccharide-23 01/06/2013   Tdap 09/13/2011   Varicella 12/24/2013   Zoster, Live 01/23/2014    TDAP status: Up to date  Flu Vaccine status: Up to date  Pneumococcal vaccine status: Up to date  Covid-19 vaccine status: Completed vaccines  Qualifies for Shingles Vaccine? Yes   Zostavax completed No   Shingrix Completed?: No.    Education has been provided regarding the importance of this vaccine. Patient has been advised to call insurance company to determine out of pocket expense if they have not yet received this vaccine. Advised may also receive vaccine at local pharmacy or Health Dept. Verbalized acceptance and understanding.  Screening Tests Health Maintenance  Topic Date Due   Zoster Vaccines- Shingrix (1 of 2) Never done   COVID-19 Vaccine (4 - Booster for Pfizer series) 08/03/2020   TETANUS/TDAP  09/12/2021   COLONOSCOPY (Pts 45-17yrs  Insurance coverage will need to be confirmed)  06/07/2022   Pneumonia Vaccine 47+ Years old  Completed   INFLUENZA VACCINE  Completed   DEXA SCAN  Completed   Hepatitis C Screening  Completed   HPV VACCINES  Aged Out    Health Maintenance  Health Maintenance Due  Topic Date Due   Zoster Vaccines- Shingrix (1 of 2) Never done   COVID-19 Vaccine (4 - Booster for Pfizer series) 08/03/2020    Colorectal cancer screening: Type of screening: Colonoscopy. Completed 06/07/12. Repeat every 10 years  Mammogram status: Completed 12/30/20. Repeat every year  Bone Density status: Completed 12/30/20. Results reflect: Bone density results: OSTEOPENIA. Repeat every 2 years.  Additional Screening:  Hepatitis C Screening:  Completed 04/18/16  Vision Screening: Recommended annual ophthalmology exams for early detection of glaucoma and other disorders of the eye. Is the patient up to date with their annual eye exam?  Yes  Who is the provider or what is the name of the  office in which the patient attends annual eye exams? Dr Greer Pickerel If pt is not established with a provider, would they like to be referred to a provider to establish care? No .   Dental Screening: Recommended annual dental exams for proper oral hygiene  Community Resource Referral / Chronic Care Management: CRR required this visit?  No   CCM required this visit?  No      Plan:     I have personally reviewed and noted the following in the patients chart:   Medical and social history Use of alcohol, tobacco or illicit drugs  Current medications and supplements including opioid prescriptions.  Functional ability and status Nutritional status Physical activity Advanced directives List of other physicians Hospitalizations, surgeries, and ER visits in previous 12 months Vitals Screenings to include cognitive, depression, and falls Referrals and appointments  In addition, I have reviewed and discussed with patient certain  preventive protocols, quality metrics, and best practice recommendations. A written personalized care plan for preventive services as well as general preventive health recommendations were provided to patient.     Tabitha Brace, LPN   [id number]   Nurse Notes: None

## 2021-07-28 NOTE — Patient Instructions (Addendum)
[name] , Thank you for taking time to come for your Medicare Wellness Visit. I appreciate your ongoing commitment to your health goals. Please review the following plan we discussed and let me know if I can assist you in the future.   Screening recommendations/referrals: Colonoscopy: Done 06/07/12 repeat every 10 years  Mammogram: Done 12/30/20 repeat every year  Bone Density: Done 12/30/20 repeat every 2 year  Recommended yearly ophthalmology/optometry visit for glaucoma screening and checkup Recommended yearly dental visit for hygiene and checkup  Vaccinations: Influenza vaccine: Done 06/02/21 Pneumococcal vaccine: Up to date Tdap vaccine: Done 09/13/11 repeat every 10 years  Shingles vaccine: Shingrix discussed. Please contact your pharmacy for coverage information.    Covid-19:Completed 1/21, 2/11, & 06/08/20  Advanced directives: Please bring a copy of your health care power of attorney and living will to the office at your convenience.  Conditions/risks identified: Lose weight   Next appointment: Follow up in one year for your annual wellness visit     Preventive Care 65 Years and Older, Female Preventive care refers to lifestyle choices and visits with your health care provider that can promote health and wellness. What does preventive care include? A yearly physical exam. This is also called an annual well check. Dental exams once or twice a year. Routine eye exams. Ask your health care provider how often you should have your eyes checked. Personal lifestyle choices, including: Daily care of your teeth and gums. Regular physical activity. Eating a healthy diet. Avoiding tobacco and drug use. Limiting alcohol use. Practicing safe sex. Taking low-dose aspirin every day. Taking vitamin and mineral supplements as recommended by your health care provider. What happens during an annual well check? The services and screenings done by your health care provider during your  annual well check will depend on your age, overall health, lifestyle risk factors, and family history of disease. Counseling  Your health care provider may ask you questions about your: Alcohol use. Tobacco use. Drug use. Emotional well-being. Home and relationship well-being. Sexual activity. Eating habits. History of falls. Memory and ability to understand (cognition). Work and work Statistician. Reproductive health. Screening  You may have the following tests or measurements: Height, weight, and BMI. Blood pressure. Lipid and cholesterol levels. These may be checked every 5 years, or more frequently if you are over 22 years old. Skin check. Lung cancer screening. You may have this screening every year starting at age 72 if you have a 30-pack-year history of smoking and currently smoke or have quit within the past 15 years. Fecal occult blood test (FOBT) of the stool. You may have this test every year starting at age 50. Flexible sigmoidoscopy or colonoscopy. You may have a sigmoidoscopy every 5 years or a colonoscopy every 10 years starting at age 4. Hepatitis C blood test. Hepatitis B blood test. Sexually transmitted disease (STD) testing. Diabetes screening. This is done by checking your blood sugar (glucose) after you have not eaten for a while (fasting). You may have this done every 1-3 years. Bone density scan. This is done to screen for osteoporosis. You may have this done starting at age 58. Mammogram. This may be done every 1-2 years. Talk to your health care provider about how often you should have regular mammograms. Talk with your health care provider about your test results, treatment options, and if necessary, the need for more tests. Vaccines  Your health care provider may recommend certain vaccines, such as: Influenza vaccine. This is recommended every year.  Tetanus, diphtheria, and acellular pertussis (Tdap, Td) vaccine. You may need a Td booster every 10  years. Zoster vaccine. You may need this after age 50. Pneumococcal 13-valent conjugate (PCV13) vaccine. One dose is recommended after age 49. Pneumococcal polysaccharide (PPSV23) vaccine. One dose is recommended after age 96. Talk to your health care provider about which screenings and vaccines you need and how often you need them. This information is not intended to replace advice given to you by your health care provider. Make sure you discuss any questions you have with your health care provider. Document Released: 08/21/2015 Document Revised: 04/13/2016 Document Reviewed: 05/26/2015 Elsevier Interactive Patient Education  2017 [place] Prevention in the Home Falls can cause injuries. They can happen to people of all ages. There are many things you can do to make your home safe and to help prevent falls. What can I do on the outside of my home? Regularly fix the edges of walkways and driveways and fix any cracks. Remove anything that might make you trip as you walk through a door, such as a raised step or threshold. Trim any bushes or trees on the path to your home. Use bright outdoor lighting. Clear any walking paths of anything that might make someone trip, such as rocks or tools. Regularly check to see if handrails are loose or broken. Make sure that both sides of any steps have handrails. Any raised decks and porches should have guardrails on the edges. Have any leaves, snow, or ice cleared regularly. Use sand or salt on walking paths during winter. Clean up any spills in your garage right away. This includes oil or grease spills. What can I do in the bathroom? Use night lights. Install grab bars by the toilet and in the tub and shower. Do not use towel bars as grab bars. Use non-skid mats or decals in the tub or shower. If you need to sit down in the shower, use a plastic, non-slip stool. Keep the floor dry. Clean up any water that spills on the floor as soon as it  happens. Remove soap buildup in the tub or shower regularly. Attach bath mats securely with double-sided non-slip rug tape. Do not have throw rugs and other things on the floor that can make you trip. What can I do in the bedroom? Use night lights. Make sure that you have a light by your bed that is easy to reach. Do not use any sheets or blankets that are too big for your bed. They should not hang down onto the floor. Have a firm chair that has side arms. You can use this for support while you get dressed. Do not have throw rugs and other things on the floor that can make you trip. What can I do in the kitchen? Clean up any spills right away. Avoid walking on wet floors. Keep items that you use a lot in easy-to-reach places. If you need to reach something above you, use a strong step stool that has a grab bar. Keep electrical cords out of the way. Do not use floor polish or wax that makes floors slippery. If you must use wax, use non-skid floor wax. Do not have throw rugs and other things on the floor that can make you trip. What can I do with my stairs? Do not leave any items on the stairs. Make sure that there are handrails on both sides of the stairs and use them. Fix handrails that are broken or loose.  Make sure that handrails are as long as the stairways. Check any carpeting to make sure that it is firmly attached to the stairs. Fix any carpet that is loose or worn. Avoid having throw rugs at the top or bottom of the stairs. If you do have throw rugs, attach them to the floor with carpet tape. Make sure that you have a light switch at the top of the stairs and the bottom of the stairs. If you do not have them, ask someone to add them for you. What else can I do to help prevent falls? Wear shoes that: Do not have high heels. Have rubber bottoms. Are comfortable and fit you well. Are closed at the toe. Do not wear sandals. If you use a stepladder: Make sure that it is fully opened.  Do not climb a closed stepladder. Make sure that both sides of the stepladder are locked into place. Ask someone to hold it for you, if possible. Clearly mark and make sure that you can see: Any grab bars or handrails. First and last steps. Where the edge of each step is. Use tools that help you move around (mobility aids) if they are needed. These include: Canes. Walkers. Scooters. Crutches. Turn on the lights when you go into a dark area. Replace any light bulbs as soon as they burn out. Set up your furniture so you have a clear path. Avoid moving your furniture around. If any of your floors are uneven, fix them. If there are any pets around you, be aware of where they are. Review your medicines with your doctor. Some medicines can make you feel dizzy. This can increase your chance of falling. Ask your doctor what other things that you can do to help prevent falls. This information is not intended to replace advice given to you by your health care provider. Make sure you discuss any questions you have with your health care provider. Document Released: 05/21/2009 Document Revised: 12/31/2015 Document Reviewed: 08/29/2014 Elsevier Interactive Patient Education  2017 [employer].

## 2021-08-06 ENCOUNTER — Other Ambulatory Visit: Payer: Self-pay | Admitting: Family Medicine

## 2021-08-10 NOTE — Telephone Encounter (Signed)
Appt scheduled 08/12/21

## 2021-08-12 ENCOUNTER — Ambulatory Visit (INDEPENDENT_AMBULATORY_CARE_PROVIDER_SITE_OTHER): Payer: Medicare Other | Admitting: Family Medicine

## 2021-08-12 ENCOUNTER — Other Ambulatory Visit: Payer: Self-pay

## 2021-08-12 ENCOUNTER — Encounter: Payer: Self-pay | Admitting: Family Medicine

## 2021-08-12 VITALS — BP 143/81 | HR 70 | Temp 96.4°F | Wt 251.2 lb

## 2021-08-12 DIAGNOSIS — F320 Major depressive disorder, single episode, mild: Secondary | ICD-10-CM

## 2021-08-12 DIAGNOSIS — Z940 Kidney transplant status: Secondary | ICD-10-CM

## 2021-08-12 DIAGNOSIS — F411 Generalized anxiety disorder: Secondary | ICD-10-CM

## 2021-08-12 DIAGNOSIS — N183 Chronic kidney disease, stage 3 unspecified: Secondary | ICD-10-CM

## 2021-08-12 DIAGNOSIS — K439 Ventral hernia without obstruction or gangrene: Secondary | ICD-10-CM

## 2021-08-12 DIAGNOSIS — R5383 Other fatigue: Secondary | ICD-10-CM

## 2021-08-12 DIAGNOSIS — E559 Vitamin D deficiency, unspecified: Secondary | ICD-10-CM | POA: Diagnosis not present

## 2021-08-12 DIAGNOSIS — K769 Liver disease, unspecified: Secondary | ICD-10-CM | POA: Diagnosis not present

## 2021-08-12 DIAGNOSIS — G4719 Other hypersomnia: Secondary | ICD-10-CM

## 2021-08-12 LAB — COMPREHENSIVE METABOLIC PANEL
ALT: 10 U/L (ref 0–35)
AST: 13 U/L (ref 0–37)
Albumin: 3.7 g/dL (ref 3.5–5.2)
Alkaline Phosphatase: 76 U/L (ref 39–117)
BUN: 21 mg/dL (ref 6–23)
CO2: 26 mEq/L (ref 19–32)
Calcium: 9.3 mg/dL (ref 8.4–10.5)
Chloride: 106 mEq/L (ref 96–112)
Creatinine, Ser: 1.05 mg/dL (ref 0.40–1.20)
GFR: 51.81 mL/min — ABNORMAL LOW (ref >60.00)
Glucose, Bld: 101 mg/dL — ABNORMAL HIGH (ref 70–99)
Potassium: 3.9 mEq/L (ref 3.5–5.1)
Sodium: 140 mEq/L (ref 135–145)
Total Bilirubin: 0.4 mg/dL (ref 0.2–1.2)
Total Protein: 6.4 g/dL (ref 6.0–8.3)

## 2021-08-12 LAB — CBC WITH DIFFERENTIAL/PLATELET
Basophils Absolute: 0 10*3/uL (ref 0.0–0.1)
Basophils Relative: 0.5 % (ref 0.0–3.0)
Eosinophils Absolute: 0.3 10*3/uL (ref 0.0–0.7)
Eosinophils Relative: 3.5 % (ref 0.0–5.0)
HCT: 40.8 % (ref 36.0–46.0)
Hemoglobin: 13.1 g/dL (ref 12.0–15.0)
Lymphocytes Relative: 25.5 % (ref 12.0–46.0)
Lymphs Abs: 1.9 10*3/uL (ref 0.7–4.0)
MCHC: 32.1 g/dL (ref 30.0–36.0)
MCV: 88.8 fl (ref 78.0–100.0)
Monocytes Absolute: 0.5 10*3/uL (ref 0.1–1.0)
Monocytes Relative: 6.9 % (ref 3.0–12.0)
Neutro Abs: 4.8 10*3/uL (ref 1.4–7.7)
Neutrophils Relative %: 63.6 % (ref 43.0–77.0)
Platelets: 268 10*3/uL (ref 150.0–400.0)
RBC: 4.59 Mil/uL (ref 3.87–5.11)
RDW: 13.4 % (ref 11.5–15.5)
WBC: 7.6 10*3/uL (ref 4.0–10.5)

## 2021-08-12 LAB — VITAMIN D 25 HYDROXY (VIT D DEFICIENCY, FRACTURES): VITD: 16.64 ng/mL — ABNORMAL LOW (ref 30.00–100.00)

## 2021-08-12 LAB — TSH: TSH: 2.3 u[IU]/mL (ref 0.35–5.50)

## 2021-08-12 MED ORDER — CITALOPRAM HYDROBROMIDE 20 MG PO TABS: 20.0000 mg | ORAL_TABLET | Freq: Every day | ORAL | 1 refills | Status: DC

## 2021-08-12 MED ORDER — LEVOTHYROXINE SODIUM 125 MCG PO TABS: 125.0000 ug | ORAL_TABLET | Freq: Every day | ORAL | 3 refills | Status: DC

## 2021-08-12 MED ORDER — LOSARTAN POTASSIUM 100 MG PO TABS: 100.0000 mg | ORAL_TABLET | Freq: Every day | ORAL | 3 refills | Status: DC

## 2021-08-12 MED ORDER — HYDROCHLOROTHIAZIDE 25 MG PO TABS: 25.0000 mg | ORAL_TABLET | Freq: Every day | ORAL | 3 refills | Status: DC

## 2021-08-12 NOTE — Progress Notes (Signed)
OFFICE VISIT  08/12/2021  CC:  Chief Complaint  Patient presents with   Hernia    Pt c/o possible increase in hernia size w/ fatigue x2 months.     HPI:    Patient is a 76 y.o. female who presents for follow up of her concern of fatigue and hernia. I last saw her for these problems 03/11/21. A/P as of that visit: "1) Chronic fatigue: unknown etiology.  No red flags for ominous dx such as malignancy, cardiac problem, or systemic inflammatory dz. Recent cmet and cbc normal last week at nephrologist. Will monitor TSH, iron panel, vit B12.   2) Osteoporosis: she will not be getting any dental work after all. Start alendronate 60m q week.   Start 15083mcalcium and 1000 U vit D qd. Recent calcium level 9.6.  Checking vit D level today. Plan rpt DEXA 1-2 yrs.   3) R lower abd wall hernia, suspect incisional hernia (her transplanted kidney is in RLQ). Asymptomatic. I advised her to contact her transplant surgeon to make appt.   4) CRI, hx of renal transplant. Recent sCr and GFR stable at 1.3 and 4566min respectively.   5) Anxiety: rare use of ativan 0.5mg47mF'd #30 with 1 RF today.   6) HTN: stable on hctz 25 qd, losartan 100 qd, cardura, and verapamil extended release."  INTERIM HX: Feels like her hernia in RLQ is getting bigger.  No pain, no probs eating or drinking, bowels are fine. She just worries about it all the time.  Chronically tired/fatigued.   Some excessive daytime sleepiness but also nonspecific body fatigue.  Unknown whether she snores or has apnea and sleep--no one observes her during sleep.  Admits to depressed mood, anxious/worried all the time, keyed up, irritable, sleep disrupted often. Feels like crying a lot but only occ has a spell of crying.  No panic.  Most recent visit with nephrologist November 15 was all good.  Labs stable.  PMP AWARE reviewed today: most recent rx for lorazepam was filled 03/11/21, # 30, rx by me. No red flags.  Past Medical  History:  Diagnosis Date   Anxiety    Cholelithiasis 04/2021   noted on CT abd/pelv done for R lower abd swelling   Chronic renal insufficiency, stage III (moderate) (HCC) 10/2015   Post-transplant baseline sCr 1.2-1.4.     [facility]   Val[facility]te per ID/Renal transplant team   Diverticulosis    a. 05/2012 colonoscopy   Erosive lichen planus of vulva    Topical steroids [facility] [place] [facility].   ESRD (end stage renal disease) (HCC)[place] began dialysis 2014--followed by Dr. PoweFlorene Gleneceived deceased donor kidney transplant 10/2015.   FSGS (focal segmental glomerulosclerosis)    right kidney; hx of left renal cell cancer and got nephrectomy 1993.   GERD (gastroesophageal reflux disease)    Gout    Heart murmur    (Diastolic) ECHO 1/204/5809wed that this murmur is coming from pt's R arm AV fistula   Hiatal hernia    History of renal cell cancer 1993   Left nephrectomy   Hyperlipidemia    Hypertension    Hypothyroidism    Impingement syndrome of left shoulder 04/2016   Dr. HudnChristy SartoriusPT   Lumbar spinal stenosis    Chronic LBP with bilat neurogenic claudication   Metatarsal fracture, pathologic 01/2018   Right; orthocarolina-->post op shoe continued, f/u x-ray planned.  Osteoarthritis of left knee 08/2017   severe, diffuse, tricompartmental.  Responded to steroid injection.     Osteoporosis 06/07/2016   DEXA T-score of -3.1.  Fosamax planned 2017 but dental work prevented start..  Pathologic toe fx 12/2017--repeat DEXA 08/2018, T-score -3.3 radius. 12/2020 T score radius -3.7.   Renal transplant recipient 11/10/2015   Baseline Cr as of 09/2020 1.2-1.4   SVT (supraventricular tachycardia) (HCC)    Vitamin B12 deficiency 12/2018   Starting replacement as of 12/11/2018    Past Surgical History:  Procedure Laterality Date   AV FISTULA PLACEMENT Right    Right arm: aneurismal dilatation 2017 being  followed by CV surgeons   CARDIOVASCULAR STRESS TEST     03/10/15 ETT ([facility]): Exercise ECG negative at 83% max predicted HR.    CATARACT EXTRACTION     COLONOSCOPY  2003; 05/2012   diverticulosis, no polyps.  Recall 10 yrs   colonoscopy with polypectomy     Dr Henrene Pastor   DEXA  06/07/2016; 08/22/2018; 12/30/20   2017 T-score -3.1.  2020 T score -3.3.  2022 T score -3.7   KIDNEY TRANSPLANT  November 10, 2015   Deceased donor kidney transplant, with Thymoglobulin induction   LIGATION OF ARTERIOVENOUS  FISTULA Right 02/14/2017   Procedure: LIGATION OF ARTERIOVENOUS  FISTULA;  Surgeon: Angelia Mould, MD;  Location: [facility];  Service: Vascular;  Laterality: Right;   [place]   for malignancy- left   RENAL BIOPSY     right   RESECTION OF ARTERIOVENOUS FISTULA ANEURYSM Right 02/14/2017   Procedure: EXCISION OF RIGHT BRACHIOCEPHALIC ARTERIOVENOUS FISTULA ANEURYSM;  Surgeon: Angelia Mould, MD;  Location: [facility];  Service: Vascular;  Laterality: Right;   TEE WITHOUT CARDIOVERSION N/A 08/27/2013   Procedure: TRANSESOPHAGEAL ECHOCARDIOGRAM (TEE);  Surgeon: [name], MD;  Location: [facility] ENDOSCOPY;  Service: Cardiovascular;  Laterality: N/A;   TOTAL ABDOMINAL HYSTERECTOMY W/ BILATERAL SALPINGOOPHORECTOMY  1997   fibroids   TRANSTHORACIC ECHOCARDIOGRAM     03/10/15 echo ([facility]): LV cavity normal in size, focal basal hypertrophy, normal systolic function, EF 28% (visual est). Normal wall motion, no regional wall motion abnormalities. Mild diastolic dysfunction with normal LA chamber size. No significant valve stenosis or regurgitation.   VULVA / PERINEUM BIOPSY  2015    Outpatient Medications Prior to Visit  Medication Sig Dispense Refill   alendronate (FOSAMAX) 70 MG tablet Take 1 tablet (70 mg total) by mouth every 7 (seven) days. Take with a full glass of water on an empty stomach. (Patient not taking: Reported on 06/02/2021) 12 tablet 3   aspirin EC 81 MG EC  tablet Take 1 tablet (81 mg total) by mouth daily.     AYR SALINE NASAL DROPS NA Place 2 sprays into both nostrils daily.     Camphor-Eucalyptus-Menthol (VICKS VAPORUB EX) Apply 1 application topically daily.     Cyanocobalamin (B-12 PO) Take by mouth daily.     doxazosin (CARDURA) 2 MG tablet TAKE 1 TABLET BY MOUTH  TWICE DAILY 180 tablet 1   LORazepam (ATIVAN) 0.5 MG tablet Take 1 tablet (0.5 mg total) by mouth 2 (two) times daily as needed for anxiety. 30 tablet 1   Magnesium Oxide 500 MG TABS Take 1 tablet by mouth daily.     meclizine (ANTIVERT) 25 MG tablet Take 1 tablet (25 mg total) by mouth 3 (three) times daily as needed for dizziness or nausea. (Patient not taking: Reported on 07/28/2021) 20 tablet 0  mycophenolate (MYFORTIC) 360 MG TBEC EC tablet Take 360 mg by mouth 2 (two) times daily.     nystatin (MYCOSTATIN/NYSTOP) powder Apply daily to breast and abdominal folds after shower     SODIUM BICARBONATE PO Take 650 mg by mouth 2 (two) times daily. Takes 1 in AM and 1 at bedtime     tacrolimus (PROGRAF) 1 MG capsule Take 4 mg by mouth 2 (two) times daily. Pt takes 3 in the morning and 3 at night.     triamcinolone (NASACORT) 55 MCG/ACT AERO nasal inhaler Place 2 sprays into the nose daily.     triamcinolone cream (KENALOG) 0.1 % Apply 1 application topically daily.     verapamil (CALAN-SR) 120 MG CR tablet TAKE 1 TABLET BY MOUTH  EVERY AFTERNOON 90 tablet 1   verapamil (CALAN-SR) 240 MG CR tablet TAKE 1 TABLET BY MOUTH IN  THE MORNING 90 tablet 3   hydrochlorothiazide (HYDRODIURIL) 25 MG tablet TAKE 1 TABLET BY MOUTH  DAILY 90 tablet 3   levothyroxine (SYNTHROID) 125 MCG tablet TAKE 1 TABLET BY MOUTH  DAILY 90 tablet 3   losartan (COZAAR) 100 MG tablet TAKE 1 TABLET BY MOUTH  DAILY 90 tablet 1   No facility-administered medications prior to visit.    Allergies  Allergen Reactions   Labetalol     Other reaction(s): Other   Cefaclor Rash   Naproxen Rash   Enalapril Maleate  Cough    Vasotec   Metoprolol Tartrate Rash    On legs    ROS As per HPI  PE: Vitals with BMI 08/12/2021 06/02/2021 03/11/2021  Height - '5\' 5"'  '5\' 5"'   Weight 251 lbs 3 oz 248 lbs 13 oz 245 lbs  BMI - 28.6 38.17  Systolic 711 657 903  Diastolic 81 80 69  Pulse 70 74 74     Physical Exam  Exam chaperoned by female CMA. Gen: Alert, well appearing.  Patient is oriented to person, place, time, and situation. AFFECT: pleasant, lucid thought and speech. ABD: soft, NT.  Large R lower quad abd wall hernia.  I cannot palpate the peripheral borders of the hernia.    LABS:  Last CBC Lab Results  Component Value Date   WBC 5.9 09/30/2020   HGB 12.5 09/30/2020   HCT 39 03/03/2020   MCV 92.8 02/18/2019   MCH 31.8 09/16/2015   RDW 13.8 02/18/2019   PLT 280 09/30/2020   Lab Results  Component Value Date   IRON 104 03/11/2021   TIBC 342 03/11/2021   FERRITIN 34 83/33/8329   Last metabolic panel Lab Results  Component Value Date   GLUCOSE 109 (H) 03/28/2019   NA 139 09/30/2020   K 4.5 09/30/2020   CL 105 09/30/2020   CO2 27 (A) 09/30/2020   BUN 24 (A) 09/30/2020   CREATININE 1.3 (A) 09/30/2020   GFRNONAA 37 09/04/2020   CALCIUM 9.4 09/30/2020   PHOS 2.9 01/04/2018   PROT 6.3 02/18/2019   ALBUMIN 3.6 09/30/2020   BILITOT 0.6 02/18/2019   ALKPHOS 87 03/02/2020   AST 16 03/02/2020   ALT 13 03/02/2020   Last lipids Lab Results  Component Value Date   CHOL 152 11/18/2019   HDL 41 11/18/2019   LDLCALC 90 11/18/2019   LDLDIRECT 166.3 10/02/2007   TRIG 117 11/18/2019   CHOLHDL 4 02/18/2019   Last hemoglobin A1c Lab Results  Component Value Date   HGBA1C 5.4 02/23/2012   Last thyroid functions Lab Results  Component Value  Date   TSH 3.19 03/11/2021   Last vitamin D Lab Results  Component Value Date   VD25OH 24.69 (L) 03/11/2021   Last vitamin B12 and Folate Lab Results  Component Value Date   VITAMINB12 451 03/11/2021   IMPRESSION AND PLAN:  #1 chronic  fatigue.  Some excessive daytime sleepiness as well. Will ask neurology/sleep medicine to see for consideration of sleep study to rule out sleep apnea. I do think depression is contributing to her fatigue. She was in favor of trying antidepressant--citalopram 20 mg a day started today. Check CBC, c-Met, and TSH today.  #2 right lower quadrant abdominal wall hernia. Very large, suspect she has very low risk of any complications from this. Not clear whether or not this is enlarging lately. Reassured.  Offered general surgery referral but she held off for now.  #3 chronic renal insufficiency stage III, history of renal transplant. Avoids NSAIDs and hydrates fairly well. Electrolytes and creatinine today. She gets appropriate follow-up with nephrologist and transplant clinic.  #4 hypertension.  Control is adequate. Continue losartan 100 mg daily, HCTZ 25 mg daily, and verapamil 240+120 daily.  Electrolytes and creatinine today.  #5 Liver cyst versus biliary hamartoma. As per radiology recommendations we will plan on doing repeat MRI abdomen with and without contrast --ordered today.  #6. Vit D def. Only occ takes her vit D, she doesn't recall her dose. Check vitamin D level today.  An After Visit Summary was printed and given to the patient.  FOLLOW UP: Return in about 4 weeks (around 09/09/2021) for f/u dep/anx. CPE/RCI 3 mo  Signed:  Crissie Sickles, MD           08/12/2021

## 2021-08-13 ENCOUNTER — Other Ambulatory Visit: Payer: Self-pay | Admitting: Family Medicine

## 2021-08-16 ENCOUNTER — Encounter: Payer: Self-pay | Admitting: Family Medicine

## 2021-08-24 ENCOUNTER — Encounter (HOSPITAL_COMMUNITY): Payer: Self-pay

## 2021-09-09 ENCOUNTER — Ambulatory Visit (INDEPENDENT_AMBULATORY_CARE_PROVIDER_SITE_OTHER): Payer: Medicare Other | Admitting: Neurology

## 2021-09-09 ENCOUNTER — Encounter (HOSPITAL_COMMUNITY): Payer: Self-pay

## 2021-09-09 ENCOUNTER — Encounter: Payer: Self-pay | Admitting: Neurology

## 2021-09-09 VITALS — BP 138/79 | HR 71 | Ht 65.0 in | Wt 252.2 lb

## 2021-09-09 DIAGNOSIS — G478 Other sleep disorders: Secondary | ICD-10-CM

## 2021-09-09 DIAGNOSIS — Z940 Kidney transplant status: Secondary | ICD-10-CM

## 2021-09-09 DIAGNOSIS — I471 Supraventricular tachycardia: Secondary | ICD-10-CM

## 2021-09-09 DIAGNOSIS — R351 Nocturia: Secondary | ICD-10-CM

## 2021-09-09 DIAGNOSIS — R0683 Snoring: Secondary | ICD-10-CM | POA: Diagnosis not present

## 2021-09-09 DIAGNOSIS — G4719 Other hypersomnia: Secondary | ICD-10-CM

## 2021-09-09 NOTE — Patient Instructions (Signed)

## 2021-09-09 NOTE — Progress Notes (Signed)
Subjective:    [name] ID: Tabitha [name] is a 76 y.o. female.  HPI    Tabitha [name] [facility] [address] [place], [place] [address]   Dear Dr. Anitra [name],   I saw your [name], Tabitha [name], upon your kind request in my sleep clinic today for initial consultation of her sleep disorder, in particular, concern for underlying obstructive sleep apnea.  Tabitha [name] unaccompanied today.  As you know, Tabitha [name] is a 76 year old right-handed woman with an underlying complex medical history of kidney disease with status post kidney transplant, cholelithiasis, reflux disease, gout, history of kidney cancer, hypertension, hyperlipidemia, hypothyroidism, SVT, B12 deficiency, arthritis, lumbar spinal stenosis, diverticulosis, and severe obesity with a BMI of over 40, who reports snoring and excessive daytime somnolence, nonrestorative sleep.  Symptoms have been ongoing for quite some time, at least months.  She tries to be in bed between 10 and midnight and rise time is generally between 7:30 AM and 8 AM.  She denies any telltale restless leg type symptoms.  She snores per daughter, husband has not complained about it.  They do not sleep in Tabitha same bedroom however.  She is a non-smoker and drinks alcohol rarely, caffeine in [place]a form of soda, about 2 cans/day on average.  She does not typically watch TV in her bedroom and does not take any naps.  They do not have any pets in Tabitha household.  She has 1 grown daughter and 1 grown son.  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  She is not aware of any family history of sleep apnea.  She denies recurrent morning headaches.  She does have nocturia.  Epworth sleepiness score is 5 out of 24, fatigue severity score is 39 out of 63.  Her Past Medical History Is Significant For: Past Medical History:  Diagnosis Date   Anxiety    Cholelithiasis 04/2021   noted on CT abd/pelv done for R  lower abd swelling   Chronic renal insufficiency, stage III (moderate) (HCC) 10/2015   Post-transplant baseline sCr 1.2-1.4.     CMV infection [facility]) summer 2017   Valcyte per ID/Renal transplant team   Diverticulosis    a. 05/2012 colonoscopy   Erosive lichen planus of vulva    Topical steroids (managed by Dr. Mila Palmer [name] via Tabitha [name].   ESRD (end stage renal disease) ([place])    began dialysis 2014--followed by Dr. [name].  Received deceased donor kidney transplant 10/2015.   FSGS (focal segmental glomerulosclerosis)    right kidney; hx of left renal cell cancer and got nephrectomy 1993.   GERD (gastroesophageal reflux disease)    Gout    Heart murmur    (Diastolic) ECHO 09/9474 showed that this murmur is coming from pt's R arm AV fistula   Hiatal hernia    History of renal cell cancer 1993   Left nephrectomy   Hyperlipidemia    Hypertension    Hypothyroidism    Impingement syndrome of left shoulder 04/2016   Dr. Christy [name] to PT   Lumbar spinal stenosis    Chronic LBP with bilat neurogenic claudication   Metatarsal fracture, pathologic 01/2018   Right; orthocarolina-->post op shoe continued, f/u x-ray planned.   Osteoarthritis of left knee 08/2017   severe, diffuse, tricompartmental.  Responded to steroid injection.     Osteoporosis 06/07/2016   DEXA T-score of -3.1.  Fosamax planned 2017  but dental work prevented start..  Pathologic toe fx 12/2017--repeat DEXA 08/2018, T-score -3.3 radius. 12/2020 T score radius -3.7.   Renal transplant recipient November 30, 2015   Baseline Cr as of 09/2020 1.2-1.4   SVT (supraventricular tachycardia) ([place]itha [name])    Vitamin B12 deficiency 12/2018   Starting replacement as of 12/11/2018    Her Past Surgical History Is Significant For: Past Surgical History:  Procedure Laterality Date   AV FISTULA PLACEMENT Right    Right arm: aneurismal dilatation 2017 being followed by CV surgeons   CARDIOVASCULAR STRESS TEST      03/10/15 ETT ([facility]): Exercise ECG negative at 83% max predicted HR.    CATARACT EXTRACTION     COLONOSCOPY  2003; 05/2012   diverticulosis, no polyps.  Recall 10 yrs   colonoscopy with polypectomy     Dr Henrene [name]   DEXA  06/07/2016; 08/22/2018; 12/30/20   2017 T-score -3.1.  2020 T score -3.3.  2022 T score -3.7   KIDNEY TRANSPLANT  Nov 30, 2015   Deceased donor kidney transplant, with Thymoglobulin induction   LIGATION OF ARTERIOVENOUS  FISTULA Right 02/14/2017   Procedure: LIGATION OF ARTERIOVENOUS  FISTULA;  Surgeon: Angelia Mould, MD;  Location: [facility];  Service: Vascular;  Laterality: Right;   [place]   for malignancy- left   RENAL BIOPSY     right   RESECTION OF ARTERIOVENOUS FISTULA ANEURYSM Right 02/14/2017   Procedure: EXCISION OF RIGHT BRACHIOCEPHALIC ARTERIOVENOUS FISTULA ANEURYSM;  Surgeon: Angelia Mould, MD;  Location: [facility];  Service: Vascular;  Laterality: Right;   TEE WITHOUT CARDIOVERSION N/A 08/27/2013   Procedure: TRANSESOPHAGEAL ECHOCARDIOGRAM (TEE);  Surgeon: Tabitha Hector, MD;  Location: [name];  Service: Cardiovascular;  Laterality: N/A;   TOTAL ABDOMINAL HYSTERECTOMY W/ BILATERAL SALPINGOOPHORECTOMY  1997   fibroids   TRANSTHORACIC ECHOCARDIOGRAM     03/10/15 echo ([facility]): LV cavity normal in size, focal basal hypertrophy, normal systolic function, EF 99% (visual est). Normal wall motion, no regional wall motion abnormalities. Mild diastolic dysfunction with normal LA chamber size. No significant valve stenosis or regurgitation.   VULVA / PERINEUM BIOPSY  2015    Her Family History Is Significant For: Family History  Problem Relation Age of Onset   Deep vein thrombosis Mother        post thyroid surgery   Hypertension Mother    Heart attack Father 78       deceased   Hypertension Father    Cancer Sister        breast   Cancer Paternal Aunt        pancreatic   Diabetes Maternal Grandfather    Stroke  Paternal Grandmother        in 46s   Fibroids Daughter    Colon cancer Neg Hx    Esophageal cancer Neg Hx    Stomach cancer Neg Hx    Rectal cancer Neg Hx    Sleep apnea Neg Hx     Her Social History Is Significant For: Social History   Socioeconomic History   Marital status: Married    Spouse name: Not on file   Number of children: 2   Years of education: Not on file   Highest education level: 12th grade  Occupational History   Occupation: Retired  Tobacco Use   Smoking status: Never   Smokeless tobacco: Never  Vaping Use   Vaping Use: Never used  Substance and Sexual Activity   Alcohol use:  Yes    Comment: rarely   Drug use: No   Sexual activity: Not on file  Other Topics Concern   Not on file  Social History Narrative   Lives in [place] with husband.  Retired.  Previously worked in [employer] @ [employer]   No T/A/Ds.   Social Determinants of Health   Financial Resource Strain: Low Risk    Difficulty of Paying Living Expenses: Not hard at all  Food Insecurity: No Food Insecurity   Worried About Charity fundraiser in [place] Last Year: Never true   [place] in [place] Last Year: Never true  Transportation Needs: No Transportation Needs   Lack of Transportation (Medical): No   Lack of Transportation (Non-Medical): No  Physical Activity: Insufficiently Active   Days of Exercise per Week: 2 days   Minutes of Exercise per Session: 20 min  Stress: Stress Concern Present   Feeling of Stress : To some extent  Social Connections: Moderately Integrated   Frequency of Communication with Friends and Family: More than three times a week   Frequency of Social Gatherings with Friends and Family: More than three times a week   Attends Religious [name]: 1 to 4 times per year   Active Member of [employer] or Organizations: No   Attends Archivist Meetings: Never   Marital Status: Married    Her Allergies Are:  Allergies  Allergen Reactions   Labetalol      Other reaction(s): Other   Cefaclor Rash   Naproxen Rash   Enalapril Maleate Cough    Vasotec   Metoprolol Tartrate Rash    On legs  :   Her Current Medications Are:  Outpatient Encounter Medications as of 09/09/2021  Medication Sig   aspirin EC 81 MG EC tablet Take 1 tablet (81 mg total) by mouth daily.   AYR SALINE NASAL DROPS NA Place 2 sprays into both nostrils daily.   Camphor-Eucalyptus-Menthol (VICKS VAPORUB EX) Apply 1 application topically daily.   Cyanocobalamin (B-12 PO) Take by mouth daily.   doxazosin (CARDURA) 2 MG tablet TAKE 1 TABLET BY MOUTH  TWICE DAILY   hydrochlorothiazide (HYDRODIURIL) 25 MG tablet Take 1 tablet (25 mg total) by mouth daily.   levothyroxine (SYNTHROID) 125 MCG tablet Take 1 tablet (125 mcg total) by mouth daily.   LORazepam (ATIVAN) 0.5 MG tablet Take 1 tablet (0.5 mg total) by mouth 2 (two) times daily as needed for anxiety.   losartan (COZAAR) 100 MG tablet Take 1 tablet (100 mg total) by mouth daily.   Magnesium Oxide 500 MG TABS Take 1 tablet by mouth daily.   meclizine (ANTIVERT) 25 MG tablet Take 1 tablet (25 mg total) by mouth 3 (three) times daily as needed for dizziness or nausea.   mycophenolate (MYFORTIC) 360 MG TBEC EC tablet Take 360 mg by mouth 2 (two) times daily.   nystatin (MYCOSTATIN/NYSTOP) powder Apply daily to breast and abdominal folds after shower   SODIUM BICARBONATE PO Take 650 mg by mouth 2 (two) times daily. Takes 1 in AM and 1 at bedtime   tacrolimus (PROGRAF) 1 MG capsule Take 4 mg by mouth 2 (two) times daily. Pt takes 3 in Tabitha morning and 3 at night.   triamcinolone (NASACORT) 55 MCG/ACT AERO nasal inhaler Place 2 sprays into Tabitha nose daily.   triamcinolone cream (KENALOG) 0.1 % Apply 1 application topically daily.   verapamil (CALAN-SR) 120 MG CR tablet TAKE 1 TABLET BY MOUTH  EVERY  AFTERNOON   verapamil (CALAN-SR) 240 MG CR tablet TAKE 1 TABLET BY MOUTH IN  Tabitha MORNING   alendronate (FOSAMAX) 70 MG tablet Take 1  tablet (70 mg total) by mouth every 7 (seven) days. Take with a full glass of water on an empty stomach. ([name] not taking: Reported on 06/02/2021)   citalopram (CELEXA) 20 MG tablet Take 1 tablet (20 mg total) by mouth daily. ([name] not taking: Reported on 09/09/2021)   No facility-administered encounter medications on file as of 09/09/2021.  :   Review of Systems:  Out of a complete 14 point review of systems, all are reviewed and negative with Tabitha exception of these symptoms as listed below:  Review of Systems  Neurological:        Pt is here for Sleep Consult . Pt states she does snores and has fatigue throughout Tabitha day , and hypertension. Pt denies headaches in am, sleep study, and CPAP at home.   ESS:5 FSS:39   Objective:  Neurological Exam  Physical Exam Physical Examination:   Vitals:   09/09/21 0907  BP: 138/79  Pulse: 71    General Examination: Tabitha [name] is a very pleasant 76 y.o. female in no acute distress. She appears well-developed and well-nourished and well groomed.   HEENT: Normocephalic, atraumatic, pupils are equal, round and reactive to light, extraocular tracking is good without limitation to gaze excursion or nystagmus noted. Hearing is grossly intact. Face is symmetric with normal facial animation. Speech is clear with no dysarthria noted. There is no hypophonia. There is no lip, neck/head, jaw or voice tremor. Neck is supple with full range of passive and active motion. There are no carotid bruits on auscultation. Oropharynx exam reveals: mild mouth dryness, adequate dental hygiene with several missing teeth, moderate airway crowding secondary to small airway entry, tonsillar size of about 1+, Mallampati class III.  Neck circumference of 17 three-quarter inches.  She has a minimal overbite.  Tongue protrudes centrally and palate elevates symmetrically.    Chest: Clear to auscultation without wheezing, rhonchi or crackles noted.  Heart: S1+S2+0, regular  and normal without murmurs, rubs or gallops noted.   Abdomen: Soft, non-tender and mildly protuberant, particularly in Tabitha right lower quadrant.  She feels that there is more swelling and perhaps a hernia.  She had it looked at.  She does have a kidney transplant.    Extremities: There is trace pitting edema in Tabitha distal lower extremities bilaterally.   Skin: Warm and dry without trophic changes noted.   Musculoskeletal: exam reveals no obvious joint deformities.   Neurologically:  Mental status: Tabitha [name] is awake, alert and oriented in all 4 spheres. Her immediate and remote memory, attention, language skills and fund of knowledge are appropriate. There is no evidence of aphasia, agnosia, apraxia or anomia. Speech is clear with normal prosody and enunciation. Thought process is linear. Mood is normal and affect is normal.  Cranial nerves II - XII are as described above under HEENT exam.  Motor exam: Normal bulk, strength and tone is noted. There is no tremor. Fine motor skills and coordination: grossly intact.  Cerebellar testing: No dysmetria or intention tremor. There is no truncal or gait ataxia.  Sensory exam: intact to light touch in Tabitha upper and lower extremities.  Gait, station and balance: She stands easily. No veering to one side is noted. No leaning to one side is noted. Posture is age-appropriate and stance is narrow based. Gait shows normal stride length and normal  pace. No problems turning are noted.   Assessment and Plan:  In summary, Tabitha [name] is a very pleasant 76 y.o.-year old female with an underlying complex medical history of kidney disease with status post kidney transplant, cholelithiasis, reflux disease, gout, history of kidney cancer, hypertension, hyperlipidemia, hypothyroidism, SVT, B12 deficiency, arthritis, lumbar spinal stenosis, diverticulosis, and severe obesity with a BMI of over 40, whose history and physical exam are concerning for obstructive sleep  apnea (OSA). I had a long chat with Tabitha [name] about my findings and Tabitha diagnosis of OSA, its prognosis and treatment options. We talked about medical treatments, surgical interventions and non-pharmacological approaches. I explained in particular Tabitha risks and ramifications of untreated moderate to severe OSA, especially with respect to developing cardiovascular disease down Tabitha Road, including congestive heart failure, difficult to treat hypertension, cardiac arrhythmias, or stroke. Even type 2 diabetes has, in part, been linked to untreated OSA. Symptoms of untreated OSA include daytime sleepiness, memory problems, mood irritability and mood disorder such as depression and anxiety, lack of energy, as well as recurrent headaches, especially morning headaches. We talked about trying to maintain a healthy lifestyle in general, as well as Tabitha importance of weight control. We also talked about Tabitha importance of good sleep hygiene. I recommended Tabitha following at this time: sleep study.  I outlined Tabitha differences between a moderately attended sleep versus home sleep test. I explained Tabitha sleep test procedure to Tabitha [name] and also outlined possible surgical and non-surgical treatment options of OSA, including Tabitha use of a custom-made dental device (which would require a referral to a specialist dentist or oral surgeon), upper airway surgical options, such as traditional UPPP or a novel less invasive surgical option in [place]a form of Inspire hypoglossal nerve stimulation (which would involve a referral to an ENT surgeon). I also explained [place]itha CPAP treatment option to Tabitha [name], who indicated that she would be willing to try CPAP if Tabitha need arises. I explained Tabitha importance of being compliant with PAP treatment, not only for insurance purposes but primarily to improve Her symptoms, and for Tabitha [name]'s long term health benefit, including to reduce Her cardiovascular risks. I answered all her questions today and  Tabitha [name] was in agreement. I plan to see her back after Tabitha sleep study is completed and encouraged her to call with any interim questions, concerns, problems or updates.   Thank you very much for allowing me to participate in Tabitha care of this nice [name]. If I can be of any further assistance to you please do not hesitate to call me at [phone].  Sincerely,   Tabitha [name]

## 2021-09-21 DIAGNOSIS — Z940 Kidney transplant status: Secondary | ICD-10-CM | POA: Diagnosis not present

## 2021-09-27 ENCOUNTER — Other Ambulatory Visit: Payer: Medicare Other

## 2021-10-06 DIAGNOSIS — K760 Fatty (change of) liver, not elsewhere classified: Secondary | ICD-10-CM

## 2021-10-06 HISTORY — DX: Fatty (change of) liver, not elsewhere classified: K76.0

## 2021-10-11 ENCOUNTER — Encounter: Payer: Self-pay | Admitting: Family Medicine

## 2021-10-11 ENCOUNTER — Other Ambulatory Visit: Payer: Self-pay

## 2021-10-11 ENCOUNTER — Ambulatory Visit: Payer: Medicare Other

## 2021-10-11 DIAGNOSIS — N261 Atrophy of kidney (terminal): Secondary | ICD-10-CM | POA: Diagnosis not present

## 2021-10-11 DIAGNOSIS — K5730 Diverticulosis of large intestine without perforation or abscess without bleeding: Secondary | ICD-10-CM | POA: Diagnosis not present

## 2021-10-11 DIAGNOSIS — K8020 Calculus of gallbladder without cholecystitis without obstruction: Secondary | ICD-10-CM | POA: Diagnosis not present

## 2021-10-11 DIAGNOSIS — K769 Liver disease, unspecified: Secondary | ICD-10-CM

## 2021-10-11 DIAGNOSIS — N189 Chronic kidney disease, unspecified: Secondary | ICD-10-CM | POA: Diagnosis not present

## 2021-10-11 IMAGING — MR MR ABDOMEN WO/W CM
16 of 17 series · 44 of 48 positions shown · IV contrast (gadavist)
Comparison: MRI abdomen [DATE]

CLINICAL DATA: Liver lesion follow-up

EXAM:
MRI ABDOMEN WITHOUT AND WITH CONTRAST
TECHNIQUE: Multiplanar multisequence MR imaging of the abdomen was performed
both before and after the administration of intravenous contrast.
CONTRAST:  10mL GADAVIST GADOBUTROL 1 MMOL/ML IV SOLN

[Series 5: cor ssfse / · coronal · 7.0mm · 1.48mm/px · 1 of 37 slices shown]
[im 1/37]
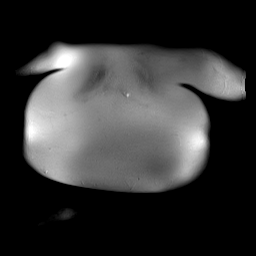

[Series 6: T2 fat-sat · axial · 6.0mm · 1.48mm/px · z∈[-88,+149]mm · 2 of 34 slices shown]
[im 1/34]
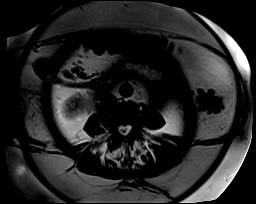
[im 34/34]
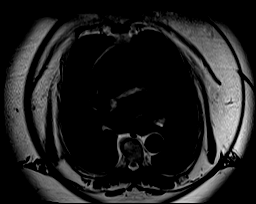

[Series 8: DWI · axial · 7.0mm · 2.08mm/px · z∈[-120,+140]mm · 5 of 96 slices shown (1 of 2)]
[im 1/96]
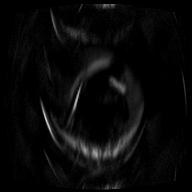
[im 24/96]
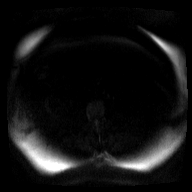
[im 48/96]
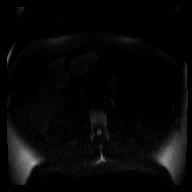
[im 72/96]
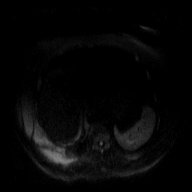
[im 96/96]
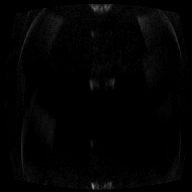

[Series 9: DWI · axial · 7.0mm · 2.08mm/px · z∈[-120,+140]mm · 2 of 32 slices shown (2 of 2)]
[im 1/32]
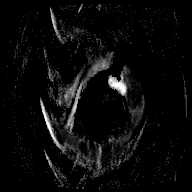
[im 32/32]
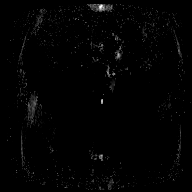

[Series 10: T1 · axial · 6.0mm · 0.74mm/px · z∈[-88,+149]mm · 3 of 68 slices shown]
[im 1/68]
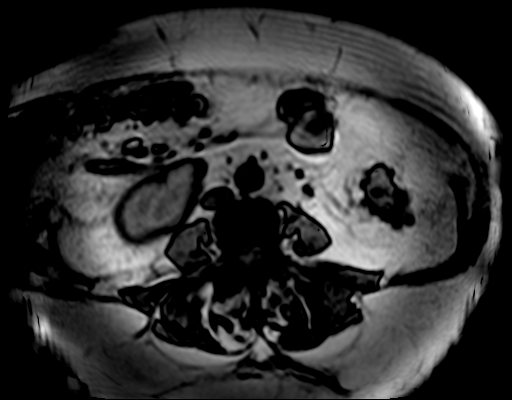
[im 34/68]
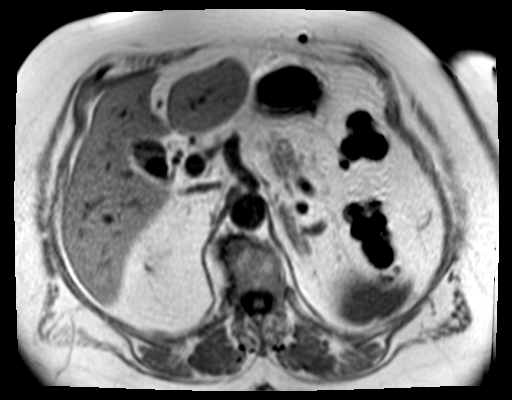
[im 68/68]
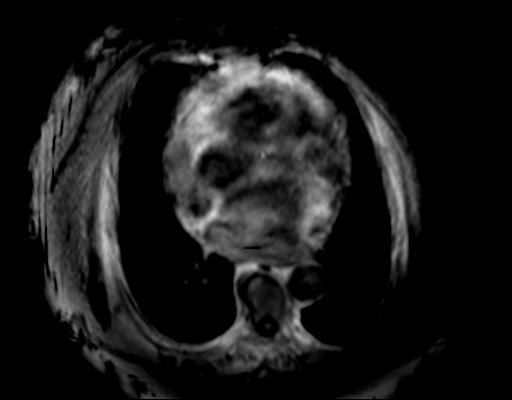

[Series 11: bSSFP · axial · 6.0mm · 0.74mm/px · z∈[-88,+149]mm · 2 of 34 slices shown]
[im 1/34]
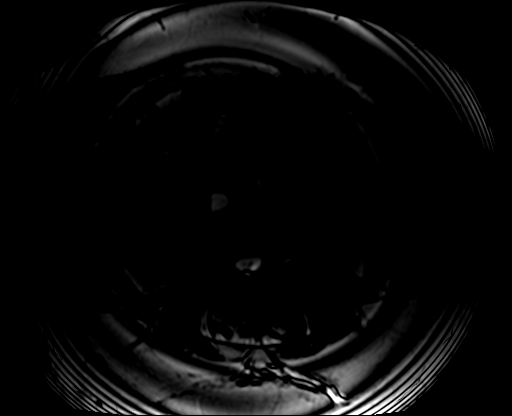
[im 34/34]
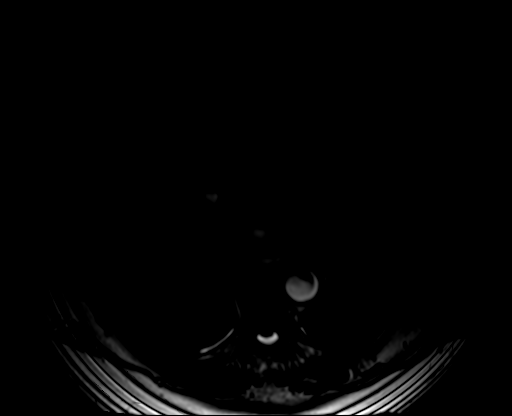

[Series 12: axial dynamic pre · axial · non-contrast · 4.0mm · 1.19mm/px · z∈[-96,+156]mm · 3 of 64 slices shown]
[im 1/64]
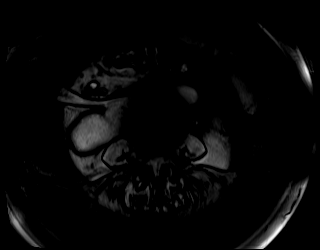
[im 32/64]
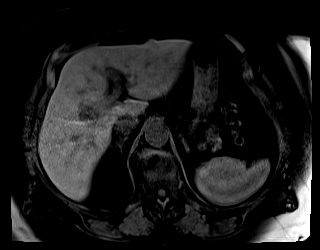
[im 64/64]
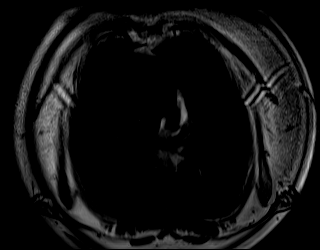

[Series 13: axial dynamic post · axial · 4.0mm · 1.19mm/px · z∈[-96,+156]mm · 3 of 64 slices shown (1 of 6)]
[im 1/64]
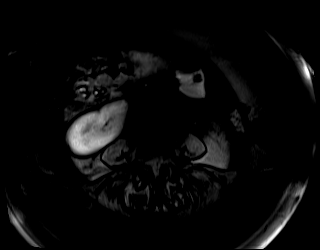
[im 32/64]
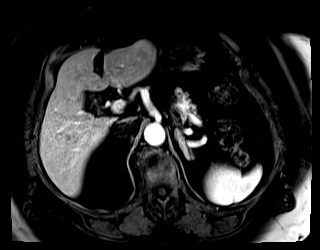
[im 64/64]
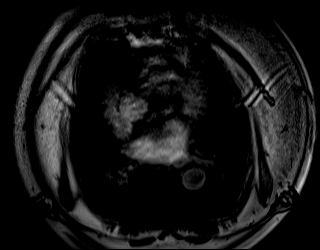

[Series 14: axial dynamic post · axial · 4.0mm · 1.19mm/px · z∈[-96,+156]mm · 3 of 64 slices shown (2 of 6)]
[im 1/64]
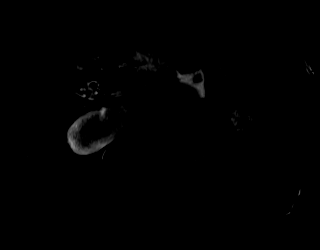
[im 32/64]
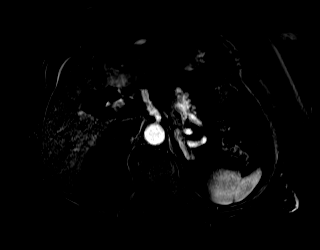
[im 64/64]
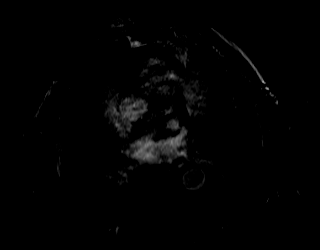

[Series 15: axial dynamic post · axial · 4.0mm · 1.19mm/px · z∈[-96,+156]mm · 3 of 64 slices shown (3 of 6)]
[im 1/64]
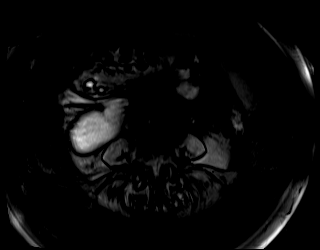
[im 32/64]
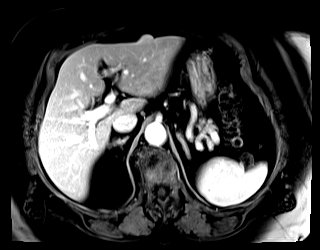
[im 64/64]
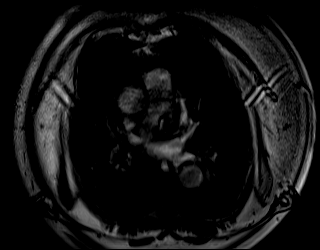

[Series 16: axial dynamic post · axial · 4.0mm · 1.19mm/px · z∈[-96,+156]mm · 3 of 64 slices shown (4 of 6)]
[im 1/64]
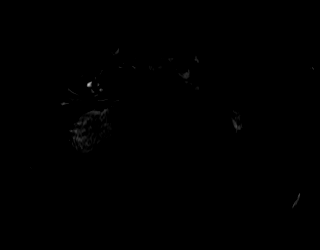
[im 32/64]
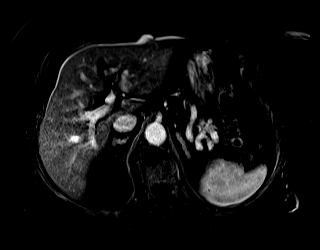
[im 64/64]
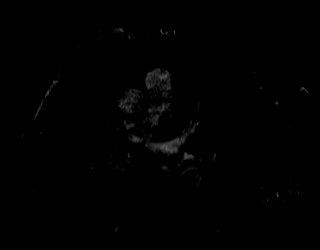

[Series 17: axial dynamic post · axial · 4.0mm · 1.19mm/px · z∈[-96,+156]mm · 3 of 64 slices shown (5 of 6)]
[im 1/64]
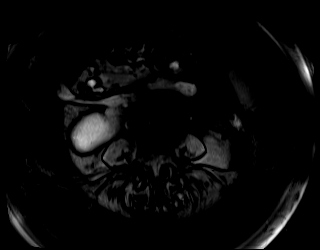
[im 32/64]
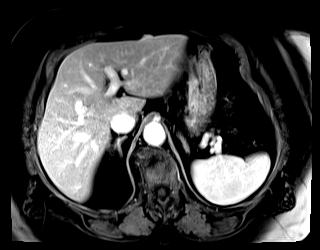
[im 64/64]
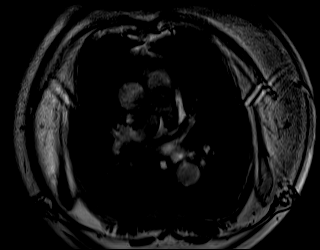

[Series 18: axial dynamic post · axial · 4.0mm · 1.19mm/px · z∈[-96,+156]mm · 3 of 64 slices shown (6 of 6)]
[im 1/64]
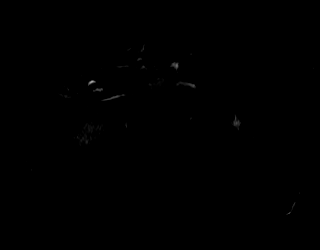
[im 32/64]
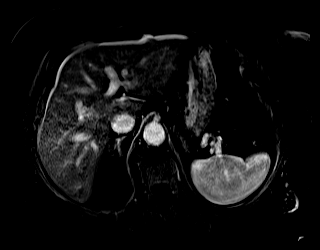
[im 64/64]
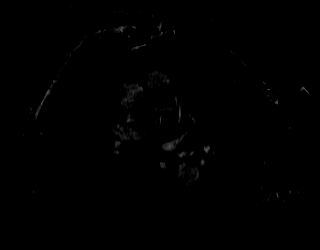

[Series 20: axial ssfse / · axial · 6.0mm · 1.19mm/px · z∈[-88,+149]mm · 2 of 34 slices shown]
[im 1/34]
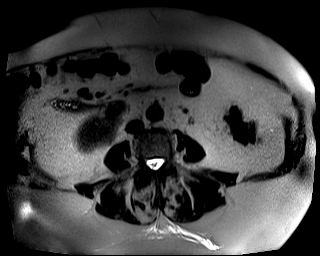
[im 34/34]
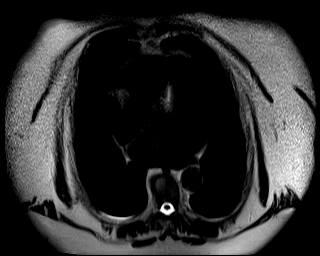

[Series 21: axial dynamic delayed · axial · 4.0mm · 1.19mm/px · z∈[-96,+156]mm · 3 of 64 slices shown]
[im 1/64]
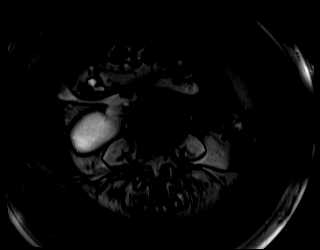
[im 32/64]
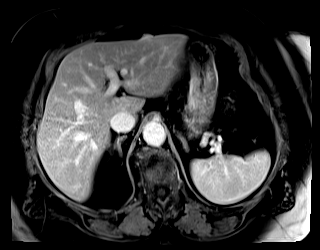
[im 64/64]
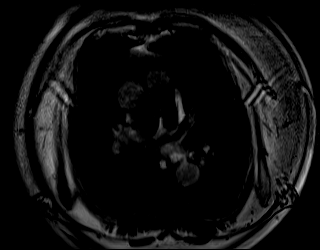

[Series 22: axial dynamic delayed_sub · axial · 4.0mm · 1.19mm/px · z∈[-96,+156]mm · 3 of 64 slices shown]
[im 1/64]
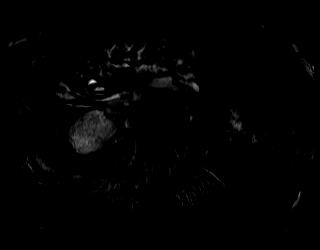
[im 32/64]
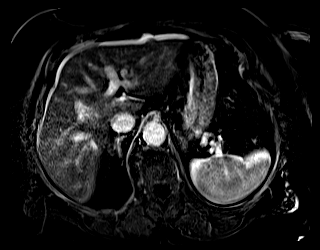
[im 64/64]
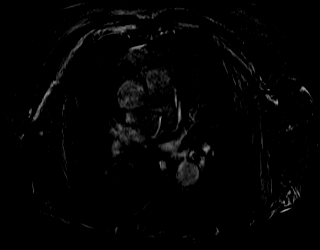

[44 of 48 positions shown; findings below may reference images not displayed]

FINDINGS: Study is limited due to motion.

Lower chest: No acute process identified.  Cardiomegaly.

Hepatobiliary: Liver is enlarged measuring 19.7 cm in length. Stable
size and appearance of the hyperintense T2, hypointense T1 signal
likely cyst in the central right hepatic lobe which measures up to
1.6 x 2 cm in axial and craniocaudal dimensions. No definite
suspicious enhancement, given limitations of motion. A few
additional stable small subcentimeter hepatic cysts are noted.
Evidence of mild hepatic steatosis. Cholelithiasis as well as
evidence focal adenomyomatosis at the gallbladder fundus. No biliary
ductal dilatation.

Pancreas: No mass, inflammatory changes, or other parenchymal
abnormality identified.

Spleen:  Within normal limits in size and appearance.

Adrenals/Urinary Tract: Adrenal glands appear grossly normal. Left
kidney not visualized. Marked atrophy of the right kidney with
several cysts measuring up to 2.6 cm in size. A transplant kidney is
partially seen in the right pelvis.

Stomach/Bowel: Colonic diverticulosis. No evidence of bowel
obstruction.

Vascular/Lymphatic: No pathologically enlarged lymph nodes
identified. No abdominal aortic aneurysm demonstrated.

Other:  No ascites.

Musculoskeletal: No suspicious bony lesions identified.
IMPRESSION: 1. Stable size and appearance of the cystic lesion in the central
right hepatic lobe, likely a benign cyst, again limited evaluation
due to motion. A few additional subcentimeter cysts also noted.
2. Cholelithiasis.
3. Colonic diverticulosis.
4. Chronic right renal failure with a transplant kidney noted in the
right pelvis.
5. Hepatomegaly and hepatic steatosis.
6. Cardiomegaly.

## 2021-10-11 MED ORDER — GADOBUTROL 1 MMOL/ML IV SOLN
10.0000 mL | Freq: Once | INTRAVENOUS | Status: AC | PRN
  Administered 2021-10-11: 10 mL via INTRAVENOUS

## 2021-10-12 ENCOUNTER — Encounter: Payer: Self-pay | Admitting: Family Medicine

## 2021-10-13 ENCOUNTER — Other Ambulatory Visit: Payer: Self-pay | Admitting: Family Medicine

## 2021-10-20 ENCOUNTER — Other Ambulatory Visit: Payer: Self-pay | Admitting: Family Medicine

## 2021-11-06 ENCOUNTER — Other Ambulatory Visit: Payer: Self-pay | Admitting: Family Medicine

## 2021-11-16 ENCOUNTER — Ambulatory Visit (INDEPENDENT_AMBULATORY_CARE_PROVIDER_SITE_OTHER): Payer: Medicare Other | Admitting: Family Medicine

## 2021-11-16 ENCOUNTER — Encounter: Payer: Self-pay | Admitting: Family Medicine

## 2021-11-16 VITALS — BP 122/76 | HR 79 | Temp 98.4°F | Ht 65.0 in | Wt 252.0 lb

## 2021-11-16 DIAGNOSIS — Z23 Encounter for immunization: Secondary | ICD-10-CM | POA: Diagnosis not present

## 2021-11-16 DIAGNOSIS — M5136 Other intervertebral disc degeneration, lumbar region: Secondary | ICD-10-CM | POA: Diagnosis not present

## 2021-11-16 DIAGNOSIS — G8929 Other chronic pain: Secondary | ICD-10-CM

## 2021-11-16 DIAGNOSIS — M545 Low back pain, unspecified: Secondary | ICD-10-CM

## 2021-11-16 DIAGNOSIS — Z79899 Other long term (current) drug therapy: Secondary | ICD-10-CM | POA: Diagnosis not present

## 2021-11-16 DIAGNOSIS — E559 Vitamin D deficiency, unspecified: Secondary | ICD-10-CM

## 2021-11-16 DIAGNOSIS — N1831 Chronic kidney disease, stage 3a: Secondary | ICD-10-CM | POA: Diagnosis not present

## 2021-11-16 DIAGNOSIS — I10 Essential (primary) hypertension: Secondary | ICD-10-CM

## 2021-11-16 DIAGNOSIS — F411 Generalized anxiety disorder: Secondary | ICD-10-CM | POA: Diagnosis not present

## 2021-11-16 LAB — LIPID PANEL
Cholesterol: 144 mg/dL (ref 0–200)
HDL: 40.3 mg/dL (ref >39.00)
LDL Cholesterol: 79 mg/dL (ref 0–99)
NonHDL: 103.99
Total CHOL/HDL Ratio: 4
Triglycerides: 126 mg/dL (ref 0.0–149.0)
VLDL: 25.2 mg/dL (ref 0.0–40.0)

## 2021-11-16 LAB — BASIC METABOLIC PANEL
BUN: 21 mg/dL (ref 6–23)
CO2: 29 mEq/L (ref 19–32)
Calcium: 9.5 mg/dL (ref 8.4–10.5)
Chloride: 102 mEq/L (ref 96–112)
Creatinine, Ser: 1.09 mg/dL (ref 0.40–1.20)
GFR: 49.45 mL/min — ABNORMAL LOW (ref >60.00)
Glucose, Bld: 94 mg/dL (ref 70–99)
Potassium: 3.7 mEq/L (ref 3.5–5.1)
Sodium: 139 mEq/L (ref 135–145)

## 2021-11-16 LAB — VITAMIN D 25 HYDROXY (VIT D DEFICIENCY, FRACTURES): VITD: 25.06 ng/mL — ABNORMAL LOW (ref 30.00–100.00)

## 2021-11-16 MED ORDER — VERAPAMIL HCL ER 120 MG PO TBCR: EXTENDED_RELEASE_TABLET | ORAL | 1 refills | Status: DC

## 2021-11-16 MED ORDER — ZOSTER VAC RECOMB ADJUVANTED 50 MCG/0.5ML IM SUSR: 0.5000 mL | Freq: Once | INTRAMUSCULAR | 1 refills | Status: AC

## 2021-11-16 MED ORDER — TETANUS-DIPHTH-ACELL PERTUSSIS 5-2-15.5 LF-MCG/0.5 IM SUSP: 0.5000 mL | Freq: Once | INTRAMUSCULAR | 0 refills | Status: AC

## 2021-11-16 MED ORDER — LORAZEPAM 0.5 MG PO TABS: 0.5000 mg | ORAL_TABLET | Freq: Two times a day (BID) | ORAL | 1 refills | Status: DC | PRN

## 2021-11-16 NOTE — Progress Notes (Signed)
OFFICE VISIT ? ?11/16/2021 ? ?CC:  ?Chief Complaint  ?Patient presents with  ? Anxiety  ? Hypertension  ?  Pt is fasting  ? ?HPI:   ? ?Patient is a 76 y.o. female who presents for 3 mo f/u anxiety, HTN, hypothyroidism, and CRI. ?A/P as of last visit: ?"#1 chronic fatigue.  Some excessive daytime sleepiness as well. ?Will ask neurology/sleep medicine to see for consideration of sleep study to rule out sleep apnea. ?I do think depression is contributing to her fatigue. ?She was in favor of trying antidepressant--citalopram 20 mg a day started today. ?Check CBC, c-Met, and TSH today. ?  ?#2 right lower quadrant abdominal wall hernia. ?Very large, suspect she has very low risk of any complications from this. ?Not clear whether or not this is enlarging lately. ?Reassured.  Offered general surgery referral but she held off for now. ?  ?#3 chronic renal insufficiency stage III, history of renal transplant. ?Avoids NSAIDs and hydrates fairly well. ?Electrolytes and creatinine today. ?She gets appropriate follow-up with nephrologist and transplant clinic. ?  ?#4 hypertension.  Control is adequate. ?Continue losartan 100 mg daily, HCTZ 25 mg daily, and verapamil 240+120 daily.  Electrolytes and creatinine today. ? ?#5 Liver cyst versus biliary hamartoma. ?As per radiology recommendations we will plan on doing repeat MRI abdomen with and without contrast --ordered today. ?  ?#6. Vit D def. ?Only occ takes her vit D, she doesn't recall her dose. ?Check vitamin D level today." ? ?INTERIM HX: ?Feeling pretty well. ?She never started citalopram because she felt like maybe her vitamin D deficiency was causing her depression. ?She takes 5000 units vitamin D daily and does feel like her depression has lifted some.  She does not want to start citalopram as of this time. ? ?She was seen for evaluation for sleep apnea and a sleep study in the lab was recommended.  She canceled this because it was dark and she cannot drive in the dark.   She will reschedule this soon given that its staying light later. ? ?No home blood pressure monitoring. ?She has follow-up with [facility] soon. ? ? ?Has chronic low back pain, points to the center of the lower L-spine in the facet joint areas bilaterally. ?No radiation into the gluteal area or into the legs.  No paresthesias.  No lower extremity weakness. ?The worst part is standing and walking for any significant length of time.  Rarely takes Tylenol or ibuprofen. ?Saw Dr. Darene Lamer and sports medicine a few years ago, L-spine x-ray showed degenerative disc disease multilevel.  She went through some PT and admits it did help some but she did not do much of the home exercises. ? ?ROS as above, plus--> no fevers, no CP, no SOB, no wheezing, no cough, no dizziness, no HAs, no rashes, no melena/hematochezia.  No polyuria or polydipsia.  No myalgias or arthralgias.  No focal weakness, paresthesias, or tremors.  No acute vision or hearing abnormalities.  No dysuria or unusual/new urinary urgency or frequency.  No recent changes in lower legs. ?No n/v/d or abd pain.  No palpitations.   ? ? ?PMP AWARE reviewed today: most recent rx for lorazepam was filled 03/11/21, # 30, rx by me. ?No red flags. ? ?Past Medical History:  ?Diagnosis Date  ? Anxiety   ? Cholelithiasis 04/2021  ? noted on CT abd/pelv done for R lower abd swelling  ? Chronic renal insufficiency, stage III (moderate) ([place]) 10/2015  ? Post-transplant baseline sCr 1.2-1.4.    ?  CMV infection [facility]) summer 2017  ? Valcyte per ID/Renal transplant team  ? Diverticulosis   ? a. 05/2012 colonoscopy  ? Erosive lichen planus of vulva   ? Topical steroids (managed by Dr. Mila Palmer [facility]  ? ESRD (end stage renal disease) ([place])   ? began dialysis 2014--followed by Dr. [name].  Received deceased donor kidney transplant 10/2015.  ? Fatty liver 10/2021  ? with hepatomegaly  ? FSGS (focal segmental glomerulosclerosis)   ? right kidney;  hx of left renal cell cancer and got nephrectomy 1993.  ? GERD (gastroesophageal reflux disease)   ? Gout   ? Heart murmur   ? (Diastolic) ECHO 03/6167 showed that this murmur is coming from pt's R arm AV fistula  ? Hiatal hernia   ? History of renal cell cancer 1993  ? Left nephrectomy  ? Hyperlipidemia   ? Hypertension   ? Hypothyroidism   ? Impingement syndrome of left shoulder 04/2016  ? Dr. Christy Sartorius to PT  ? Lumbar spinal stenosis   ? Chronic LBP with bilat neurogenic claudication  ? Metatarsal fracture, pathologic 01/2018  ? Right; orthocarolina-->post op shoe continued, f/u x-ray planned.  ? Osteoarthritis of left knee 08/2017  ? severe, diffuse, tricompartmental.  Responded to steroid injection.    ? Osteoporosis 06/07/2016  ? DEXA T-score of -3.1.  Fosamax planned 2017 but dental work prevented start..  Pathologic toe fx 12/2017--repeat DEXA 08/2018, T-score -3.3 radius. 12/2020 T score radius -3.7.  ? Renal transplant recipient Dec 05, 2015  ? Baseline Cr as of 09/2020 1.2-1.4  ? Simple hepatic cyst   ? Solitary kidney, acquired   ? SVT (supraventricular tachycardia) ([place])   ? Vitamin B12 deficiency 12/2018  ? Starting replacement as of 12/11/2018  ? ? ?Past Surgical History:  ?Procedure Laterality Date  ? AV FISTULA PLACEMENT Right   ? Right arm: aneurismal dilatation 2017 being followed by CV surgeons  ? CARDIOVASCULAR STRESS TEST    ? 03/10/15 ETT ([facility]): Exercise ECG negative at 83% max predicted HR.   ? CATARACT EXTRACTION    ? COLONOSCOPY  2003; 05/2012  ? diverticulosis, no polyps.  Recall 10 yrs  ? colonoscopy with polypectomy    ? Dr Henrene Pastor  ? DEXA  06/07/2016; 08/22/2018; 12/30/20  ? 2017 T-score -3.1.  2020 T score -3.3.  2022 T score -3.7  ? KIDNEY TRANSPLANT  2015-12-05  ? Deceased donor kidney transplant, with Thymoglobulin induction  ? LIGATION OF ARTERIOVENOUS  FISTULA Right 02/14/2017  ? Procedure: LIGATION OF ARTERIOVENOUS  FISTULA;  Surgeon: Angelia Mould, MD;  Location: [place];   Service: Vascular;  Laterality: Right;  ? NEPHRECTOMY  1993  ? for malignancy- left  ? RENAL BIOPSY    ? right  ? RESECTION OF ARTERIOVENOUS FISTULA ANEURYSM Right 02/14/2017  ? Procedure: EXCISION OF RIGHT BRACHIOCEPHALIC ARTERIOVENOUS FISTULA ANEURYSM;  Surgeon: Angelia Mould, MD;  Location: [place];  Service: Vascular;  Laterality: Right;  ? TEE WITHOUT CARDIOVERSION N/A 08/27/2013  ? Procedure: TRANSESOPHAGEAL ECHOCARDIOGRAM (TEE);  Surgeon: [name], MD;  Location: [facility] ENDOSCOPY;  Service: Cardiovascular;  Laterality: N/A;  ? TOTAL ABDOMINAL HYSTERECTOMY W/ BILATERAL SALPINGOOPHORECTOMY  1997  ? fibroids  ? TRANSTHORACIC ECHOCARDIOGRAM    ? 03/10/15 echo ([facility]): LV cavity normal in size, focal basal hypertrophy, normal systolic function, EF 37% (visual est). Normal wall motion, no regional wall motion abnormalities. Mild diastolic dysfunction with normal LA chamber  size. No significant valve stenosis or regurgitation.  ? VULVA / PERINEUM BIOPSY  2015  ? ? ?Outpatient Medications Prior to Visit  ?Medication Sig Dispense Refill  ? aspirin EC 81 MG EC tablet Take 1 tablet (81 mg total) by mouth daily.    ? AYR SALINE NASAL DROPS NA Place 2 sprays into both nostrils daily.    ? Camphor-Eucalyptus-Menthol (VICKS VAPORUB EX) Apply 1 application topically daily.    ? Cyanocobalamin (B-12 PO) Take by mouth daily.    ? doxazosin (CARDURA) 2 MG tablet TAKE 1 TABLET BY MOUTH  TWICE DAILY 180 tablet 1  ? hydrochlorothiazide (HYDRODIURIL) 25 MG tablet Take 1 tablet (25 mg total) by mouth daily. 90 tablet 3  ? levothyroxine (SYNTHROID) 125 MCG tablet Take 1 tablet (125 mcg total) by mouth daily. 90 tablet 3  ? losartan (COZAAR) 100 MG tablet Take 1 tablet (100 mg total) by mouth daily. 90 tablet 3  ? Magnesium Oxide 500 MG TABS Take 1 tablet by mouth daily.    ? mycophenolate (MYFORTIC) 360 MG TBEC EC tablet Take 360 mg by mouth 2 (two) times daily.    ? nystatin (MYCOSTATIN/NYSTOP) powder  Apply daily to breast and abdominal folds after shower    ? SODIUM BICARBONATE PO Take 650 mg by mouth 2 (two) times daily. Takes 1 in AM and 1 at bedtime    ? tacrolimus (PROGRAF) 1 MG capsule Take 4 m

## 2021-11-17 LAB — DM TEMPLATE

## 2021-11-17 LAB — DRUG MONITORING PANEL 376104, URINE
Amphetamines: NEGATIVE ng/mL (ref <500)
Barbiturates: NEGATIVE ng/mL (ref <300)
Benzodiazepines: NEGATIVE ng/mL (ref <100)
Cocaine Metabolite: NEGATIVE ng/mL (ref <150)
Desmethyltramadol: NEGATIVE ng/mL (ref <100)
Opiates: NEGATIVE ng/mL (ref <100)
Oxycodone: NEGATIVE ng/mL (ref <100)
Tramadol: NEGATIVE ng/mL (ref <100)

## 2021-11-18 ENCOUNTER — Telehealth: Payer: Self-pay

## 2021-11-18 MED ORDER — VITAMIN D (ERGOCALCIFEROL) 1.25 MG (50000 UNIT) PO CAPS: 50000.0000 [IU] | ORAL_CAPSULE | ORAL | 1 refills | Status: DC

## 2021-11-18 NOTE — Telephone Encounter (Signed)
-----   Message from Tammi Sou, MD sent at 11/17/2021 12:48 PM EDT ----- ?Kidney function is stable.  Electrolytes are normal.  Cholesterol normal. ?Vitamin D still significantly low. ?Please prescribe 50,000 unit vitamin D tab, take 1/week, #12, refill x1. ?Stop taking the daily vitamin D. ? ?

## 2021-11-25 ENCOUNTER — Ambulatory Visit (INDEPENDENT_AMBULATORY_CARE_PROVIDER_SITE_OTHER): Payer: Medicare Other | Admitting: Family Medicine

## 2021-11-25 ENCOUNTER — Encounter: Payer: Self-pay | Admitting: Family Medicine

## 2021-11-25 VITALS — BP 130/80 | HR 74 | Temp 98.8°F | Ht 65.0 in | Wt 254.6 lb

## 2021-11-25 DIAGNOSIS — N3001 Acute cystitis with hematuria: Secondary | ICD-10-CM | POA: Diagnosis not present

## 2021-11-25 DIAGNOSIS — R350 Frequency of micturition: Secondary | ICD-10-CM

## 2021-11-25 DIAGNOSIS — R051 Acute cough: Secondary | ICD-10-CM

## 2021-11-25 DIAGNOSIS — J069 Acute upper respiratory infection, unspecified: Secondary | ICD-10-CM

## 2021-11-25 LAB — POCT URINALYSIS DIPSTICK
Bilirubin, UA: NEGATIVE
Blood, UA: 25
Glucose, UA: NEGATIVE
Ketones, UA: NEGATIVE
Leukocytes, UA: NEGATIVE
Nitrite, UA: NEGATIVE
Protein, UA: POSITIVE — AB
Spec Grav, UA: 1.015 (ref 1.010–1.025)
Urobilinogen, UA: 0.2 E.U./dL
pH, UA: 6.5 (ref 5.0–8.0)

## 2021-11-25 LAB — POCT INFLUENZA A/B
Influenza A, POC: NEGATIVE
Influenza B, POC: NEGATIVE

## 2021-11-25 LAB — POC COVID19 BINAXNOW: SARS Coronavirus 2 Ag: NEGATIVE

## 2021-11-25 MED ORDER — SULFAMETHOXAZOLE-TRIMETHOPRIM 800-160 MG PO TABS: 1.0000 | ORAL_TABLET | Freq: Two times a day (BID) | ORAL | 0 refills | Status: DC

## 2021-11-25 NOTE — Progress Notes (Signed)
OFFICE VISIT ? ?11/25/2021 ? ?CC:  ?Chief Complaint  ?Patient presents with  ? Cough  ? ?Patient is a 76 y.o. female who presents for cough. ? ?HPI: ?Tabitha Green had onset of nasal congestion and postnasal drip 5 to 6 days ago. ?She then developed cough with some production of clear phlegm. ?All symptoms have begun to improve.  However she took home COVID test this morning x2 and both were positive.  She says these test kits were purchased a couple years ago and expired for 11/14/2021. ?Denies shortness of breath, wheezing, chest pain, or dizziness. ? ?She also complains of some increasing urinary frequency and urgency over the last 24 hours.  Denies dysuria.  No abdominal pain, no nausea, no fever, no flank pain. ? ?Past Medical History:  ?Diagnosis Date  ? Anxiety   ? Cholelithiasis 04/2021  ? noted on CT abd/pelv done for R lower abd swelling  ? Chronic renal insufficiency, stage III (moderate) ([place]) 10/2015  ? Post-transplant baseline sCr 1.2-1.4.    ? CMV infection ([place]) summer 2017  ? Valcyte per ID/Renal transplant team  ? Diverticulosis   ? a. 05/2012 colonoscopy  ? Erosive lichen planus of vulva   ? Topical steroids (managed by Dr. Mila Palmer [facility].  ? ESRD (end stage renal disease) ([place])   ? began dialysis 2014--followed by Dr. [name].  Received deceased donor kidney transplant 10/2015.  ? Fatty liver 10/2021  ? with hepatomegaly  ? FSGS (focal segmental glomerulosclerosis)   ? right kidney; hx of left renal cell cancer and got nephrectomy 1993.  ? GERD (gastroesophageal reflux disease)   ? Gout   ? Heart murmur   ? (Diastolic) ECHO 0/9470 showed that this murmur is coming from pt's R arm AV fistula  ? Hiatal hernia   ? History of renal cell cancer 1993  ? Left nephrectomy  ? Hyperlipidemia   ? Hypertension   ? Hypothyroidism   ? Impingement syndrome of left shoulder 04/2016  ? Dr. Christy Sartorius to PT  ? Lumbar spinal stenosis   ? Chronic LBP with bilat neurogenic  claudication  ? Metatarsal fracture, pathologic 01/2018  ? Right; orthocarolina-->post op shoe continued, f/u x-ray planned.  ? Osteoarthritis of left knee 08/2017  ? severe, diffuse, tricompartmental.  Responded to steroid injection.    ? Osteoporosis 06/07/2016  ? DEXA T-score of -3.1.  Fosamax planned 2017 but dental work prevented start..  Pathologic toe fx 12/2017--repeat DEXA 08/2018, T-score -3.3 radius. 12/2020 T score radius -3.7.  ? Renal transplant recipient 11/17/2015  ? Baseline Cr as of 09/2020 1.2-1.4  ? Simple hepatic cyst   ? Solitary kidney, acquired   ? SVT (supraventricular tachycardia) ([place])   ? Vitamin B12 deficiency 12/2018  ? Starting replacement as of 12/11/2018  ? ? ?Past Surgical History:  ?Procedure Laterality Date  ? AV FISTULA PLACEMENT Right   ? Right arm: aneurismal dilatation 2017 being followed by CV surgeons  ? CARDIOVASCULAR STRESS TEST    ? 03/10/15 ETT ([facility]): Exercise ECG negative at 83% max predicted HR.   ? CATARACT EXTRACTION    ? COLONOSCOPY  2003; 05/2012  ? diverticulosis, no polyps.  Recall 10 yrs  ? colonoscopy with polypectomy    ? Dr Henrene Pastor  ? DEXA  06/07/2016; 08/22/2018; 12/30/20  ? 2017 T-score -3.1.  2020 T score -3.3.  2022 T score -3.7  ? KIDNEY TRANSPLANT  2015/11/17  ? Deceased donor kidney transplant, with Thymoglobulin  induction  ? LIGATION OF ARTERIOVENOUS  FISTULA Right 02/14/2017  ? Procedure: LIGATION OF ARTERIOVENOUS  FISTULA;  Surgeon: Angelia Mould, MD;  Location: [place];  Service: Vascular;  Laterality: Right;  ? NEPHRECTOMY  1993  ? for malignancy- left  ? RENAL BIOPSY    ? right  ? RESECTION OF ARTERIOVENOUS FISTULA ANEURYSM Right 02/14/2017  ? Procedure: EXCISION OF RIGHT BRACHIOCEPHALIC ARTERIOVENOUS FISTULA ANEURYSM;  Surgeon: Angelia Mould, MD;  Location: [place];  Service: Vascular;  Laterality: Right;  ? TEE WITHOUT CARDIOVERSION N/A 08/27/2013  ? Procedure: TRANSESOPHAGEAL ECHOCARDIOGRAM (TEE);  Surgeon: [name], MD;  Location:  [facility] ENDOSCOPY;  Service: Cardiovascular;  Laterality: N/A;  ? TOTAL ABDOMINAL HYSTERECTOMY W/ BILATERAL SALPINGOOPHORECTOMY  1997  ? fibroids  ? TRANSTHORACIC ECHOCARDIOGRAM    ? 03/10/15 echo ([facility]): LV cavity normal in size, focal basal hypertrophy, normal systolic function, EF 35% (visual est). Normal wall motion, no regional wall motion abnormalities. Mild diastolic dysfunction with normal LA chamber size. No significant valve stenosis or regurgitation.  ? VULVA / PERINEUM BIOPSY  2015  ? ? ?Outpatient Medications Prior to Visit  ?Medication Sig Dispense Refill  ? aspirin EC 81 MG EC tablet Take 1 tablet (81 mg total) by mouth daily.    ? AYR SALINE NASAL DROPS NA Place 2 sprays into both nostrils daily.    ? Camphor-Eucalyptus-Menthol (VICKS VAPORUB EX) Apply 1 application topically daily.    ? Cyanocobalamin (B-12 PO) Take by mouth daily.    ? doxazosin (CARDURA) 2 MG tablet TAKE 1 TABLET BY MOUTH  TWICE DAILY 180 tablet 1  ? hydrochlorothiazide (HYDRODIURIL) 25 MG tablet Take 1 tablet (25 mg total) by mouth daily. 90 tablet 3  ? levothyroxine (SYNTHROID) 125 MCG tablet Take 1 tablet (125 mcg total) by mouth daily. 90 tablet 3  ? LORazepam (ATIVAN) 0.5 MG tablet Take 1 tablet (0.5 mg total) by mouth 2 (two) times daily as needed for anxiety. 30 tablet 1  ? losartan (COZAAR) 100 MG tablet Take 1 tablet (100 mg total) by mouth daily. 90 tablet 3  ? Magnesium Oxide 500 MG TABS Take 1 tablet by mouth daily.    ? mycophenolate (MYFORTIC) 360 MG TBEC EC tablet Take 360 mg by mouth 2 (two) times daily.    ? nystatin (MYCOSTATIN/NYSTOP) powder Apply daily to breast and abdominal folds after shower    ? SODIUM BICARBONATE PO Take 650 mg by mouth 2 (two) times daily. Takes 1 in AM and 1 at bedtime    ? tacrolimus (PROGRAF) 1 MG capsule Take 4 mg by mouth 2 (two) times daily. Pt takes 3 in the morning and 3 at night.    ? triamcinolone (NASACORT) 55 MCG/ACT AERO nasal inhaler Place 2 sprays into  the nose daily.    ? triamcinolone cream (KENALOG) 0.1 % Apply 1 application topically daily.    ? verapamil (CALAN-SR) 120 MG CR tablet TAKE 1 TABLET BY MOUTH EVERY AFTERNOON 90 tablet 1  ? verapamil (CALAN-SR) 240 MG CR tablet TAKE 1 TABLET BY MOUTH IN  THE MORNING 90 tablet 3  ? Vitamin D, Ergocalciferol, (DRISDOL) 1.25 MG (50000 UNIT) CAPS capsule Take 1 capsule (50,000 Units total) by mouth every 7 (seven) days. 12 capsule 1  ? alendronate (FOSAMAX) 70 MG tablet Take 1 tablet (70 mg total) by mouth every 7 (seven) days. Take with a full glass of water on an empty stomach. (Patient not taking: Reported on  06/02/2021) 12 tablet 3  ? citalopram (CELEXA) 20 MG tablet Take 1 tablet (20 mg total) by mouth daily. (Patient not taking: Reported on 09/09/2021) 30 tablet 1  ? meclizine (ANTIVERT) 25 MG tablet Take 1 tablet (25 mg total) by mouth 3 (three) times daily as needed for dizziness or nausea. (Patient not taking: Reported on 11/16/2021) 20 tablet 0  ? ?No facility-administered medications prior to visit.  ? ? ?Allergies  ?Allergen Reactions  ? Labetalol   ?  Other reaction(s): Other  ? Cefaclor Rash  ? Naproxen Rash  ? Enalapril Maleate Cough  ?  Vasotec  ? Metoprolol Tartrate Rash  ?  On legs  ? ? ?ROS ?As per HPI ? ?PE: ? ?  11/25/2021  ?  1:31 PM 11/16/2021  ? 10:02 AM 09/09/2021  ?  9:07 AM  ?Vitals with BMI  ?Height '5\' 5"'$  '5\' 5"'$  '5\' 5"'$   ?Weight 254 lbs 10 oz 252 lbs 252 lbs 3 oz  ?BMI 42.37 41.93 41.97  ?Systolic 732 202 542  ?Diastolic 80 76 79  ?Pulse 74 79 71  ? ? ? ?Physical Exam ? ?VS: noted--normal. ?Gen: alert, NAD, NONTOXIC APPEARING. ?HEENT: eyes without injection, drainage, or swelling.  Ears: EACs clear, TMs with normal light reflex and landmarks.  Nose: Scant clear rhinorrhea.  Otherwise, nasal mucosa normal.   No paranasal sinus TTP.  No facial swelling.  Throat and mouth without focal lesion.  No pharyngial swelling, erythema, or exudate.   ?Neck: supple, no LAD.   ?LUNGS: CTA bilat, nonlabored resps.    ?CV: RRR, no m/r/g. ?EXT: no c/c/e ?SKIN: no rash ? ?LABS:  ?Last CBC ?Lab Results  ?Component Value Date  ? WBC 7.6 08/12/2021  ? HGB 13.1 08/12/2021  ? HCT 40.8 08/12/2021  ? MCV 88.8 08/12/2021  ? M

## 2021-11-26 ENCOUNTER — Ambulatory Visit: Payer: Medicare Other | Admitting: Family Medicine

## 2021-11-28 LAB — URINE CULTURE
MICRO NUMBER:: 13291835
SPECIMEN QUALITY:: ADEQUATE

## 2021-12-05 DIAGNOSIS — Z23 Encounter for immunization: Secondary | ICD-10-CM | POA: Diagnosis not present

## 2021-12-05 DIAGNOSIS — S51851A Open bite of right forearm, initial encounter: Secondary | ICD-10-CM | POA: Diagnosis not present

## 2021-12-07 ENCOUNTER — Other Ambulatory Visit: Payer: Self-pay | Admitting: Family Medicine

## 2021-12-07 ENCOUNTER — Other Ambulatory Visit: Payer: Self-pay

## 2021-12-07 MED ORDER — VERAPAMIL HCL ER 240 MG PO TBCR: 240.0000 mg | EXTENDED_RELEASE_TABLET | Freq: Every morning | ORAL | 1 refills | Status: DC

## 2021-12-24 ENCOUNTER — Emergency Department (INDEPENDENT_AMBULATORY_CARE_PROVIDER_SITE_OTHER)
Admission: RE | Admit: 2021-12-24 | Discharge: 2021-12-24 | Disposition: A | Discharge status: Home / Self Care | Payer: Medicare Other | Source: Ambulatory Visit

## 2021-12-24 VITALS — BP 145/84 | HR 85 | Temp 98.5°F | Resp 18 | Ht 65.0 in | Wt 248.0 lb

## 2021-12-24 DIAGNOSIS — N3001 Acute cystitis with hematuria: Secondary | ICD-10-CM | POA: Diagnosis not present

## 2021-12-24 LAB — POCT URINALYSIS DIP (MANUAL ENTRY)
Bilirubin, UA: NEGATIVE
Glucose, UA: NEGATIVE mg/dL
Ketones, POC UA: NEGATIVE mg/dL
Nitrite, UA: NEGATIVE
Protein Ur, POC: 30 mg/dL — AB
Spec Grav, UA: 1.02 (ref 1.010–1.025)
Urobilinogen, UA: 0.2 E.U./dL
pH, UA: 6 (ref 5.0–8.0)

## 2021-12-24 MED ORDER — NITROFURANTOIN MONOHYD MACRO 100 MG PO CAPS: 100.0000 mg | ORAL_CAPSULE | Freq: Two times a day (BID) | ORAL | 0 refills | Status: AC

## 2021-12-24 NOTE — ED Provider Notes (Signed)
[name]    CSN: [id number] Arrival date & time: 12/24/21  0850      History   Chief Complaint Chief Complaint  Patient presents with   Urinary Frequency    Entered by patient   Appointment    HPI Tabitha Green is a 76 y.o. female.   HPI 76 year old female presents with urinary urgency and frequency since early this morning.  PMH significant for chronic renal insufficiency, stage III, FSGS, s/p renal cell carcinoma.  Past Medical History:  Diagnosis Date   Anxiety    Cholelithiasis 04/2021   noted on CT abd/pelv done for R lower abd swelling   Chronic renal insufficiency, stage III (moderate) (HCC) 10/2015   Post-transplant baseline sCr 1.2-1.4.     CMV infection [facility]) summer 2017   Valcyte per ID/Renal transplant team   Diverticulosis    a. 05/2012 colonoscopy   Erosive lichen planus of vulva    Topical steroids (managed by Dr. Mila Palmer [facility] via [facility] out[facility].   ESRD (end stage renal disease) ([place])    began dialysis 2014--followed by Dr. [name].  Received deceased donor kidney transplant 10/2015.   Fatty liver 10/2021   with hepatomegaly   FSGS (focal segmental glomerulosclerosis)    right kidney; hx of left renal cell cancer and got nephrectomy 1993.   GERD (gastroesophageal reflux disease)    Gout    Heart murmur    (Diastolic) ECHO 01/6439 showed that this murmur is coming from pt's R arm AV fistula   Hiatal hernia    History of renal cell cancer 1993   Left nephrectomy   Hyperlipidemia    Hypertension    Hypothyroidism    Impingement syndrome of left shoulder 04/2016   Dr. Christy Sartorius to PT   Lumbar spinal stenosis    Chronic LBP with bilat neurogenic claudication   Metatarsal fracture, pathologic 01/2018   Right; orthocarolina-->post op shoe continued, f/u x-ray planned.   Osteoarthritis of left knee 08/2017   severe, diffuse, tricompartmental.  Responded to steroid injection.     Osteoporosis  06/07/2016   DEXA T-score of -3.1.  Fosamax planned 2017 but dental work prevented start..  Pathologic toe fx 12/2017--repeat DEXA 08/2018, T-score -3.3 radius. 12/2020 T score radius -3.7.   Renal transplant recipient 11/06/2015   Baseline Cr as of 09/2020 1.2-1.4   Simple hepatic cyst    Solitary kidney, acquired    SVT (supraventricular tachycardia) ([place])    Vitamin B12 deficiency 12/2018   Starting replacement as of 12/11/2018    Patient Active Problem List   Diagnosis Date Noted   Combined forms of age-related cataract of right eye 10/20/2020   COVID-19 08/06/2020   Lumbar spinal stenosis 02/05/2019   Cherry angioma 01/14/2018   Lentigines 01/14/2018   Multiple benign nevi 01/14/2018   Pain in left knee 08/28/2017   Seborrheic keratosis 11/23/2016   Left shoulder pain 05/02/2016   Wheeze 09/16/2015   Cough 09/16/2015   Acute bronchitis 09/16/2015   History of renal carcinoma 05/14/2015   Vulvar ulceration 06/22/2014   End stage renal disease ([place]) 04/09/2014   Mechanical complication of other vascular device, implant, and graft 04/09/2014   Cervical spondylosis 10/10/2013   Aortic insufficiency 08/20/2013   Hyperglycemia 12/14/2012   SVT (supraventricular tachycardia) ([place]) 12/13/2012   Uremia 12/13/2012   Chronic kidney disease (CKD), stage IV (severe) ([place]) 12/07/2012   Bradycardia 12/07/2012   VITAMIN D DEFICIENCY 09/08/2009   CHRON GLOMERULONEPHRIT W/LES MEMBRANOUS  GLN 09/08/2009   FATIGUE 09/08/2009   DIVERTICULOSIS, COLON 09/26/2008   OSTEOPENIA 09/26/2008   COLONIC POLYPS, HX OF 09/26/2008   Hypothyroidism 10/02/2007   OTHER AND UNSPECIFIED HYPERLIPIDEMIA 10/02/2007   BENIGN POSITIONAL VERTIGO 10/02/2007   Unspecified essential hypertension 10/02/2007   Gout, unspecified 03/26/2007   DISORDER, DYSMETABOLIC SYNDROME X 78/29/5621   HX, PERSONAL, MALIGNANCY, KIDNEY 03/26/2007   OSTEOARTHRITIS 12/26/2006    Past Surgical History:  Procedure Laterality Date   AV  FISTULA PLACEMENT Right    Right arm: aneurismal dilatation 2017 being followed by CV surgeons   CARDIOVASCULAR STRESS TEST     03/10/15 ETT ([facility]): Exercise ECG negative at 83% max predicted HR.    CATARACT EXTRACTION     COLONOSCOPY  2003; 05/2012   diverticulosis, no polyps.  Recall 10 yrs   colonoscopy with polypectomy     Dr Henrene Pastor   DEXA  06/07/2016; 08/22/2018; 12/30/20   2017 T-score -3.1.  2020 T score -3.3.  2022 T score -3.7   KIDNEY TRANSPLANT  11-23-15   Deceased donor kidney transplant, with Thymoglobulin induction   LIGATION OF ARTERIOVENOUS  FISTULA Right 02/14/2017   Procedure: LIGATION OF ARTERIOVENOUS  FISTULA;  Surgeon: Angelia Mould, MD;  Location: [place];  Service: Vascular;  Laterality: Right;   [place]   for malignancy- left   RENAL BIOPSY     right   RESECTION OF ARTERIOVENOUS FISTULA ANEURYSM Right 02/14/2017   Procedure: EXCISION OF RIGHT BRACHIOCEPHALIC ARTERIOVENOUS FISTULA ANEURYSM;  Surgeon: Angelia Mould, MD;  Location: [place];  Service: Vascular;  Laterality: Right;   TEE WITHOUT CARDIOVERSION N/A 08/27/2013   Procedure: TRANSESOPHAGEAL ECHOCARDIOGRAM (TEE);  Surgeon: [name], MD;  Location: [facility] ENDOSCOPY;  Service: Cardiovascular;  Laterality: N/A;   TOTAL ABDOMINAL HYSTERECTOMY W/ BILATERAL SALPINGOOPHORECTOMY  1997   fibroids   TRANSTHORACIC ECHOCARDIOGRAM     03/10/15 echo ([facility]): LV cavity normal in size, focal basal hypertrophy, normal systolic function, EF 30% (visual est). Normal wall motion, no regional wall motion abnormalities. Mild diastolic dysfunction with normal LA chamber size. No significant valve stenosis or regurgitation.   VULVA / PERINEUM BIOPSY  2015    OB History     Gravida  2   Para  2   Term  2   Preterm  0   AB  0   Living  2      SAB  0   IAB  0   Ectopic  0   Multiple  0   Live Births               Home Medications    Prior to Admission  medications   Medication Sig Start Date End Date Taking? Authorizing Provider  aspirin EC 81 MG EC tablet Take 1 tablet (81 mg total) by mouth daily. 12/09/12  Yes Edmisten, Brooke O, PA-C  AYR SALINE NASAL DROPS NA Place 2 sprays into both nostrils daily.   Yes [provider]  Camphor-Eucalyptus-Menthol (VICKS VAPORUB EX) Apply 1 application topically daily.   Yes [provider]  citalopram (CELEXA) 20 MG tablet Take 1 tablet (20 mg total) by mouth daily. 08/12/21  Yes McGowen, Adrian Blackwater, MD  Cyanocobalamin (B-12 PO) Take by mouth daily.   Yes [provider]  doxazosin (CARDURA) 2 MG tablet TAKE 1 TABLET BY MOUTH  TWICE DAILY 05/21/21  Yes McGowen, Adrian Blackwater, MD  hydrochlorothiazide (HYDRODIURIL) 25 MG tablet Take 1  tablet (25 mg total) by mouth daily. 08/12/21  Yes McGowen, Adrian Blackwater, MD  levothyroxine (SYNTHROID) 125 MCG tablet Take 1 tablet (125 mcg total) by mouth daily. 08/12/21  Yes McGowen, Adrian Blackwater, MD  LORazepam (ATIVAN) 0.5 MG tablet Take 1 tablet (0.5 mg total) by mouth 2 (two) times daily as needed for anxiety. 11/16/21  Yes McGowen, Adrian Blackwater, MD  losartan (COZAAR) 100 MG tablet Take 1 tablet (100 mg total) by mouth daily. 08/12/21  Yes McGowen, Adrian Blackwater, MD  Magnesium Oxide 500 MG TABS Take 1 tablet by mouth daily.   Yes [provider]  meclizine (ANTIVERT) 25 MG tablet Take 1 tablet (25 mg total) by mouth 3 (three) times daily as needed for dizziness or nausea. 07/30/15  Yes Noe Gens, PA-C  mycophenolate (MYFORTIC) 360 MG TBEC EC tablet Take 360 mg by mouth 2 (two) times daily.   Yes [provider]  nitrofurantoin, macrocrystal-monohydrate, (MACROBID) 100 MG capsule Take 1 capsule (100 mg total) by mouth 2 (two) times daily for 7 days. 12/24/21 12/31/21 Yes Eliezer Lofts, FNP  nystatin (MYCOSTATIN/NYSTOP) powder Apply daily to breast and abdominal folds after shower 07/20/21  Yes [provider]  SODIUM BICARBONATE PO Take 650 mg by  mouth 2 (two) times daily. Takes 1 in AM and 1 at bedtime   Yes [provider]  sulfamethoxazole-trimethoprim (BACTRIM DS) 800-160 MG tablet Take 1 tablet by mouth 2 (two) times daily. 11/25/21  Yes McGowen, Adrian Blackwater, MD  tacrolimus (PROGRAF) 1 MG capsule Take 4 mg by mouth 2 (two) times daily. Pt takes 3 in the morning and 3 at night.   Yes [provider]  triamcinolone (NASACORT) 55 MCG/ACT AERO nasal inhaler Place 2 sprays into the nose daily.   Yes [provider]  triamcinolone cream (KENALOG) 0.1 % Apply 1 application topically daily.   Yes [provider]  verapamil (CALAN-SR) 120 MG CR tablet TAKE 1 TABLET BY MOUTH EVERY AFTERNOON 11/16/21  Yes McGowen, Adrian Blackwater, MD  verapamil (CALAN-SR) 240 MG CR tablet Take 1 tablet (240 mg total) by mouth every morning. 12/07/21  Yes McGowen, Adrian Blackwater, MD  Vitamin D, Ergocalciferol, (DRISDOL) 1.25 MG (50000 UNIT) CAPS capsule Take 1 capsule (50,000 Units total) by mouth every 7 (seven) days. 11/18/21  Yes McGowen, Adrian Blackwater, MD  alendronate (FOSAMAX) 70 MG tablet Take 1 tablet (70 mg total) by mouth every 7 (seven) days. Take with a full glass of water on an empty stomach. 03/11/21   McGowen, Adrian Blackwater, MD    Family History Family History  Problem Relation Age of Onset   Deep vein thrombosis Mother        post thyroid surgery   Hypertension Mother    Heart attack Father 29       deceased   Hypertension Father    Cancer Sister        breast   Cancer Paternal Aunt        pancreatic   Diabetes Maternal Grandfather    Stroke Paternal Grandmother        in 70s   Fibroids Daughter    Colon cancer Neg Hx    Esophageal cancer Neg Hx    Stomach cancer Neg Hx    Rectal cancer Neg Hx    Sleep apnea Neg Hx     Social History Social History   Tobacco Use   Smoking status: Never   Smokeless tobacco: Never  Vaping Use  Vaping Use: Never used  Substance Use Topics   Alcohol use: Yes    Comment: rarely   Drug use:  No     Allergies   Labetalol, Cefaclor, Naproxen, Enalapril maleate, and Metoprolol tartrate   Review of Systems Review of Systems  Genitourinary:  Positive for frequency.    Physical Exam Triage Vital Signs ED Triage Vitals  Enc Vitals Group     BP      Pulse      Resp      Temp      Temp src      SpO2      Weight      Height      Head Circumference      Peak Flow      Pain Score      Pain Loc      Pain Edu?      Excl. in [place]?    No data found.  Updated Vital Signs BP (!) 145/84 (BP Location: Left Arm)   Pulse 85   Temp 98.5 F (36.9 C) (Oral)   Resp 18   Ht '5\' 5"'$  (1.651 m)   Wt 248 lb (112.5 kg)   SpO2 97%   BMI 41.27 kg/m       Physical Exam Vitals and nursing note reviewed.  Constitutional:      Appearance: She is obese.  HENT:     Head: Normocephalic and atraumatic.     Mouth/Throat:     Mouth: Mucous membranes are moist.     Pharynx: Oropharynx is clear.  Eyes:     Extraocular Movements: Extraocular movements intact.     Conjunctiva/sclera: Conjunctivae normal.     Pupils: Pupils are equal, round, and reactive to light.  Cardiovascular:     Rate and Rhythm: Normal rate and regular rhythm.     Pulses: Normal pulses.     Heart sounds: Normal heart sounds.  Pulmonary:     Effort: Pulmonary effort is normal.     Breath sounds: Normal breath sounds. No wheezing, rhonchi or rales.  Abdominal:     Tenderness: There is no right CVA tenderness or left CVA tenderness.  Musculoskeletal:     Cervical back: Normal range of motion and neck supple.  Skin:    General: Skin is warm and dry.  Neurological:     General: No focal deficit present.     Mental Status: She is alert and oriented to person, place, and time. Mental status is at baseline.     UC Treatments / Results  Labs (all labs ordered are listed, but only abnormal results are displayed) Labs Reviewed  POCT URINALYSIS DIP (MANUAL ENTRY) - Abnormal; Notable for the following components:       Result Value   Blood, UA moderate (*)    Protein Ur, POC =30 (*)    Leukocytes, UA Large (3+) (*)    All other components within normal limits  URINE CULTURE    EKG   Radiology No results found.  Procedures Procedures (including critical care time)  Medications Ordered in UC Medications - No data to display  Initial Impression / Assessment and Plan / UC Course  I have reviewed the triage vital signs and the nursing notes.  Pertinent labs & imaging results that were available during my care of the patient were reviewed by me and considered in my medical decision making (see chart for details).     MDM: 1.  Acute cystitis with  hematuria-Rx'd Macrobid. Instructed patient to take medication as directed with food to completion.  Encouraged patient to increase daily water intake while taking this medication.  Advised patient we will follow-up with her once urine culture results are received.  Advised patient if symptoms worsen and/or unresolved please follow-up with PCP or here for further evaluation.  Discharged home, hemodynamically stable. Final Clinical Impressions(s) / UC Diagnoses   Final diagnoses:  Acute cystitis with hematuria     Discharge Instructions      Instructed patient to take medication as directed with food to completion.  Encouraged patient to increase daily water intake while taking this medication.  Advised patient we will follow-up with her once urine culture results are received.  Advised patient if symptoms worsen and/or unresolved please follow-up with PCP or here for further evaluation.     ED Prescriptions     Medication Sig Dispense Auth. Provider   nitrofurantoin, macrocrystal-monohydrate, (MACROBID) 100 MG capsule Take 1 capsule (100 mg total) by mouth 2 (two) times daily for 7 days. 14 capsule Eliezer Lofts, FNP      PDMP not reviewed this encounter.   Eliezer Lofts, [place] 12/24/21 [phone]

## 2021-12-24 NOTE — Discharge Instructions (Addendum)
Instructed patient to take medication as directed with food to completion.  Encouraged patient to increase daily water intake while taking this medication.  Advised patient we will follow-up with her once urine culture results are received.  Advised patient if symptoms worsen and/or unresolved please follow-up with PCP or here for further evaluation.

## 2021-12-24 NOTE — ED Triage Notes (Signed)
Patient c/o urinary urgency and frequency when awakening this am.  No hematuria, no dysuria.  Patient denies any OTC meds.

## 2021-12-26 LAB — URINE CULTURE
MICRO NUMBER:: 13420436
SPECIMEN QUALITY:: ADEQUATE

## 2021-12-27 ENCOUNTER — Telehealth: Payer: Self-pay

## 2021-12-27 NOTE — Telephone Encounter (Signed)
Pt went to urgent care  [facility] - Client Nonclinical Telephone Record  [facility] - Client Client Site [place] - Day Provider Crissie Sickles - MD Contact Type Call Who Is Calling Patient / Member / Family / Caregiver Caller Name Lyle Phone Number [phone] Patient Name Tabitha Green Patient DOB 1945-08-23 Call Type Message Only Information Provided Reason for Call Request to Schedule Office Appointment Initial Comment Caller states she has a UTI and asking can she be seen today Disp. Time Disposition Final User 12/24/2021 8:08:57 AM General Information Provided Yes Kenton Kingfisher, [place]ette Call Closed By: Nelia Shi Transaction Date/Time: 12/24/2021 8:05:22 AM (ET)

## 2021-12-30 DIAGNOSIS — Z940 Kidney transplant status: Secondary | ICD-10-CM | POA: Diagnosis not present

## 2022-01-14 DIAGNOSIS — I10 Essential (primary) hypertension: Secondary | ICD-10-CM | POA: Diagnosis not present

## 2022-01-14 DIAGNOSIS — Z940 Kidney transplant status: Secondary | ICD-10-CM | POA: Diagnosis not present

## 2022-01-14 DIAGNOSIS — Z79899 Other long term (current) drug therapy: Secondary | ICD-10-CM | POA: Diagnosis not present

## 2022-01-14 DIAGNOSIS — E559 Vitamin D deficiency, unspecified: Secondary | ICD-10-CM | POA: Diagnosis not present

## 2022-01-18 DIAGNOSIS — L304 Erythema intertrigo: Secondary | ICD-10-CM | POA: Diagnosis not present

## 2022-01-18 DIAGNOSIS — L438 Other lichen planus: Secondary | ICD-10-CM | POA: Diagnosis not present

## 2022-03-01 ENCOUNTER — Other Ambulatory Visit (HOSPITAL_BASED_OUTPATIENT_CLINIC_OR_DEPARTMENT_OTHER): Payer: Self-pay | Admitting: Family Medicine

## 2022-03-01 DIAGNOSIS — Z1231 Encounter for screening mammogram for malignant neoplasm of breast: Secondary | ICD-10-CM

## 2022-03-03 ENCOUNTER — Ambulatory Visit (INDEPENDENT_AMBULATORY_CARE_PROVIDER_SITE_OTHER): Payer: Medicare Other

## 2022-03-03 DIAGNOSIS — Z1231 Encounter for screening mammogram for malignant neoplasm of breast: Secondary | ICD-10-CM | POA: Diagnosis not present

## 2022-04-05 DIAGNOSIS — N390 Urinary tract infection, site not specified: Secondary | ICD-10-CM | POA: Diagnosis not present

## 2022-04-05 DIAGNOSIS — Z940 Kidney transplant status: Secondary | ICD-10-CM | POA: Diagnosis not present

## 2022-04-06 ENCOUNTER — Other Ambulatory Visit: Payer: Self-pay | Admitting: Family Medicine

## 2022-04-12 DIAGNOSIS — K769 Liver disease, unspecified: Secondary | ICD-10-CM | POA: Diagnosis not present

## 2022-04-12 DIAGNOSIS — D849 Immunodeficiency, unspecified: Secondary | ICD-10-CM | POA: Diagnosis not present

## 2022-04-12 DIAGNOSIS — B259 Cytomegaloviral disease, unspecified: Secondary | ICD-10-CM | POA: Diagnosis not present

## 2022-04-12 DIAGNOSIS — K469 Unspecified abdominal hernia without obstruction or gangrene: Secondary | ICD-10-CM | POA: Diagnosis not present

## 2022-04-12 DIAGNOSIS — K760 Fatty (change of) liver, not elsewhere classified: Secondary | ICD-10-CM | POA: Diagnosis not present

## 2022-04-12 DIAGNOSIS — N390 Urinary tract infection, site not specified: Secondary | ICD-10-CM | POA: Diagnosis not present

## 2022-04-12 DIAGNOSIS — Z79899 Other long term (current) drug therapy: Secondary | ICD-10-CM | POA: Diagnosis not present

## 2022-04-12 DIAGNOSIS — N051 Unspecified nephritic syndrome with focal and segmental glomerular lesions: Secondary | ICD-10-CM | POA: Diagnosis not present

## 2022-04-12 DIAGNOSIS — Z940 Kidney transplant status: Secondary | ICD-10-CM | POA: Diagnosis not present

## 2022-04-12 DIAGNOSIS — I10 Essential (primary) hypertension: Secondary | ICD-10-CM | POA: Diagnosis not present

## 2022-04-12 DIAGNOSIS — N2581 Secondary hyperparathyroidism of renal origin: Secondary | ICD-10-CM | POA: Diagnosis not present

## 2022-04-14 ENCOUNTER — Telehealth: Payer: Self-pay | Admitting: Family Medicine

## 2022-04-14 NOTE — Telephone Encounter (Signed)
LM for pt regarding refill. Medication pending to be sent to [facility]. Please confirm

## 2022-04-14 NOTE — Telephone Encounter (Signed)
Pt needs refill for Doxazosin. She mentions that she would like it sent to [facility], but please contact patient to verify.

## 2022-04-15 DIAGNOSIS — L858 Other specified epidermal thickening: Secondary | ICD-10-CM | POA: Diagnosis not present

## 2022-04-15 MED ORDER — DOXAZOSIN MESYLATE 2 MG PO TABS
2.0000 mg | ORAL_TABLET | Freq: Two times a day (BID) | ORAL | 1 refills | Status: DC
Start: 2022-04-15 — End: 2022-06-20

## 2022-04-15 NOTE — Telephone Encounter (Signed)
Confirmed with patient, she wanted the medication sent to [facility]. She thinks she will have enough until mail order received.

## 2022-04-26 ENCOUNTER — Other Ambulatory Visit: Payer: Self-pay | Admitting: Family Medicine

## 2022-05-31 DIAGNOSIS — K625 Hemorrhage of anus and rectum: Secondary | ICD-10-CM | POA: Diagnosis not present

## 2022-06-01 ENCOUNTER — Telehealth: Payer: Self-pay

## 2022-06-01 ENCOUNTER — Encounter: Payer: Self-pay | Admitting: Internal Medicine

## 2022-06-01 DIAGNOSIS — K625 Hemorrhage of anus and rectum: Secondary | ICD-10-CM | POA: Diagnosis not present

## 2022-06-01 DIAGNOSIS — Z8719 Personal history of other diseases of the digestive system: Secondary | ICD-10-CM | POA: Diagnosis not present

## 2022-06-01 LAB — BASIC METABOLIC PANEL
BUN: 17 (ref 4–21)
CO2: 24 — AB (ref 13–22)
Chloride: 111 — AB (ref 99–108)
Creatinine: 1 (ref 0.5–1.1)
Glucose: 120
Potassium: 4 mEq/L (ref 3.5–5.1)
Sodium: 141 (ref 137–147)

## 2022-06-01 LAB — CBC AND DIFFERENTIAL
HCT: 39 (ref 36–46)
Hemoglobin: 12.6 (ref 12.0–16.0)
Neutrophils Absolute: 5.35
Platelets: 238 10*3/uL (ref 150–400)
WBC: 7.6

## 2022-06-01 LAB — COMPREHENSIVE METABOLIC PANEL
Calcium: 9.2 (ref 8.7–10.7)
eGFR: 52

## 2022-06-01 LAB — PROTIME-INR: Protime: 11.1 (ref 10.0–13.8)

## 2022-06-01 LAB — CBC: RBC: 4.21 (ref 3.87–5.11)

## 2022-06-01 NOTE — Telephone Encounter (Signed)
Pt advised she may need an appt in order for labs to be completed. She went to ED today and is currently still in [place].  ED doctor wanted her to have labs at [place] there. Appt scheduled for Monday, 10/30 with PCP  Please advise.

## 2022-06-01 NOTE — Telephone Encounter (Signed)
[place] doctor wanted patient to have labs before she left to come back home, he told patient to contact PCP to place order for labs.  Please call patient [phone]

## 2022-06-02 NOTE — Telephone Encounter (Signed)
Appointment for 06/06/2022 is appropriate. Please get records from the ED she saw in [place].

## 2022-06-02 NOTE — Telephone Encounter (Signed)
Pt advised. ED records are in [place], [facility])

## 2022-06-03 ENCOUNTER — Ambulatory Visit: Payer: Medicare Other | Admitting: Family Medicine

## 2022-06-06 ENCOUNTER — Encounter: Payer: Self-pay | Admitting: Family Medicine

## 2022-06-06 ENCOUNTER — Ambulatory Visit (INDEPENDENT_AMBULATORY_CARE_PROVIDER_SITE_OTHER): Payer: Medicare Other | Admitting: Family Medicine

## 2022-06-06 VITALS — BP 115/79 | HR 70 | Temp 98.7°F | Ht 65.0 in | Wt 248.4 lb

## 2022-06-06 DIAGNOSIS — Z23 Encounter for immunization: Secondary | ICD-10-CM | POA: Diagnosis not present

## 2022-06-06 DIAGNOSIS — K625 Hemorrhage of anus and rectum: Secondary | ICD-10-CM

## 2022-06-06 DIAGNOSIS — R630 Anorexia: Secondary | ICD-10-CM

## 2022-06-06 DIAGNOSIS — Z940 Kidney transplant status: Secondary | ICD-10-CM

## 2022-06-06 DIAGNOSIS — N1831 Chronic kidney disease, stage 3a: Secondary | ICD-10-CM

## 2022-06-06 LAB — POCT URINALYSIS DIPSTICK
Bilirubin, UA: NEGATIVE
Blood, UA: NEGATIVE
Glucose, UA: NEGATIVE
Ketones, UA: NEGATIVE
Leukocytes, UA: NEGATIVE
Protein, UA: POSITIVE — AB
Spec Grav, UA: 1.015 (ref 1.010–1.025)
Urobilinogen, UA: 1 E.U./dL
pH, UA: 7 (ref 5.0–8.0)

## 2022-06-06 LAB — CBC
HCT: 39.8 % (ref 36.0–46.0)
Hemoglobin: 13 g/dL (ref 12.0–15.0)
MCHC: 32.7 g/dL (ref 30.0–36.0)
MCV: 91.3 fl (ref 78.0–100.0)
Platelets: 274 10*3/uL (ref 150.0–400.0)
RBC: 4.36 Mil/uL (ref 3.87–5.11)
RDW: 14.3 % (ref 11.5–15.5)
WBC: 8 10*3/uL (ref 4.0–10.5)

## 2022-06-06 NOTE — Progress Notes (Signed)
OFFICE VISIT  06/06/2022  CC:  Chief Complaint  Patient presents with   Hospitalization Follow-up    Hosp follow passing blood during BM x 1 week. Pt is fatigue.    Patient is a 76 y.o. female who presents for emergency department follow-up.  HPI: Tabitha Green presents for follow-up evaluation for recent presentation to emergency department at hospital in [facility] on 06/01/2022 for rectal bleeding. Reviewed record in EMR today.  Asymptomatic/painless rectal bleeding was her chief complaint upon presentation.  Upon evaluation her serum creatinine was stable at 1.04 (GFR 52).  Her hemoglobin was 12.6.  She was deemed stable for outpatient lab follow-up and arrangement of outpatient GI evaluation. She does have a history of diverticulosis.  Last colonoscopy was 2013 (Dr. Henrene Pastor, [facility]).  UPDATE: She has about 1 bowel movement a day since she was in the ER, sees a little bit of bright red blood.  Other than that she says for about the last week she has had a decreased appetite and feeling of "low-grade" fever.  Temperature typically 98 or less at home.  No abdominal pain. Some intermittent suprapubic discomfort but this is not unusual for her due to her right lower quadrant abdominal wall hernia. No nausea or vomiting.  ROS as above, plus--> no CP, no SOB, no wheezing, baseline level of mild nasal congestion and PND cough., no dizziness, no HAs, no rashes, no melena/hematochezia.  No polyuria or polydipsia.  No myalgias or arthralgias.  No focal weakness, paresthesias, or tremors.  No acute vision or hearing abnormalities.  No dysuria or unusual/new urinary urgency or frequency.  No recent changes in lower legs. No palpitations.    Past Medical History:  Diagnosis Date   Anxiety    Cholelithiasis 04/2021   noted on CT abd/pelv done for R lower abd swelling   Chronic renal insufficiency, stage III (moderate) (HCC) 10/2015   Post-transplant baseline sCr 1.2-1.4.     CMV  infection [facility]) summer 2017   Valcyte per ID/Renal transplant team   Diverticulosis    a. 05/2012 colonoscopy   Erosive lichen planus of vulva    Topical steroids (managed by Dr. Mila Palmer [facility] via [facility].   ESRD (end stage renal disease) ([place])    began dialysis 2014--followed by Dr. [name].  Received deceased donor kidney transplant 10/2015.   Fatty liver 10/2021   with hepatomegaly   FSGS (focal segmental glomerulosclerosis)    right kidney; hx of left renal cell cancer and got nephrectomy 1993.   GERD (gastroesophageal reflux disease)    Gout    Heart murmur    (Diastolic) ECHO 10/6142 showed that this murmur is coming from pt's R arm AV fistula   Hiatal hernia    History of renal cell cancer 1993   Left nephrectomy   Hyperlipidemia    Hypertension    Hypothyroidism    Impingement syndrome of left shoulder 04/2016   Dr. Christy Sartorius to PT   Lumbar spinal stenosis    Chronic LBP with bilat neurogenic claudication   Metatarsal fracture, pathologic 01/2018   Right; orthocarolina-->post op shoe continued, f/u x-ray planned.   Osteoarthritis of left knee 08/2017   severe, diffuse, tricompartmental.  Responded to steroid injection.     Osteoporosis 06/07/2016   DEXA T-score of -3.1.  Fosamax planned 2017 but dental work prevented start..  Pathologic toe fx 12/2017--repeat DEXA 08/2018, T-score -3.3 radius. 12/2020 T score radius -3.7.   Renal transplant recipient 11/06/2015  Baseline Cr as of 09/2020 1.2-1.4   Simple hepatic cyst    Solitary kidney, acquired    SVT (supraventricular tachycardia)    Vitamin B12 deficiency 12/2018   Starting replacement as of 12/11/2018    Past Surgical History:  Procedure Laterality Date   AV FISTULA PLACEMENT Right    Right arm: aneurismal dilatation 2017 being followed by CV surgeons   CARDIOVASCULAR STRESS TEST     03/10/15 ETT ([facility]): Exercise ECG negative at 83% max predicted HR.    CATARACT  EXTRACTION     COLONOSCOPY  2003; 05/2012   diverticulosis, no polyps.  Recall 10 yrs   colonoscopy with polypectomy     Dr Henrene Pastor   DEXA  06/07/2016; 08/22/2018; 12/30/20   2017 T-score -3.1.  2020 T score -3.3.  2022 T score -3.7   KIDNEY TRANSPLANT  2015/11/20   Deceased donor kidney transplant, with Thymoglobulin induction   LIGATION OF ARTERIOVENOUS  FISTULA Right 02/14/2017   Procedure: LIGATION OF ARTERIOVENOUS  FISTULA;  Surgeon: Angelia Mould, MD;  Location: [place];  Service: Vascular;  Laterality: Right;   [place]   for malignancy- left   RENAL BIOPSY     right   RESECTION OF ARTERIOVENOUS FISTULA ANEURYSM Right 02/14/2017   Procedure: EXCISION OF RIGHT BRACHIOCEPHALIC ARTERIOVENOUS FISTULA ANEURYSM;  Surgeon: Angelia Mould, MD;  Location: [place];  Service: Vascular;  Laterality: Right;   TEE WITHOUT CARDIOVERSION N/A 08/27/2013   Procedure: TRANSESOPHAGEAL ECHOCARDIOGRAM (TEE);  Surgeon: [name], MD;  Location: [facility] ENDOSCOPY;  Service: Cardiovascular;  Laterality: N/A;   TOTAL ABDOMINAL HYSTERECTOMY W/ BILATERAL SALPINGOOPHORECTOMY  1997   fibroids   TRANSTHORACIC ECHOCARDIOGRAM     03/10/15 echo ([facility]): LV cavity normal in size, focal basal hypertrophy, normal systolic function, EF 02% (visual est). Normal wall motion, no regional wall motion abnormalities. Mild diastolic dysfunction with normal LA chamber size. No significant valve stenosis or regurgitation.   VULVA / PERINEUM BIOPSY  2015    Outpatient Medications Prior to Visit  Medication Sig Dispense Refill   alendronate (FOSAMAX) 70 MG tablet Take 1 tablet (70 mg total) by mouth every 7 (seven) days. Take with a full glass of water on an empty stomach. 12 tablet 3   aspirin EC 81 MG EC tablet Take 1 tablet (81 mg total) by mouth daily.     AYR SALINE NASAL DROPS NA Place 2 sprays into both nostrils daily.     Camphor-Eucalyptus-Menthol (VICKS VAPORUB EX) Apply 1  application topically daily.     citalopram (CELEXA) 20 MG tablet Take 1 tablet (20 mg total) by mouth daily. 30 tablet 1   Cyanocobalamin (B-12 PO) Take by mouth daily.     doxazosin (CARDURA) 2 MG tablet Take 1 tablet (2 mg total) by mouth 2 (two) times daily. 180 tablet 1   hydrochlorothiazide (HYDRODIURIL) 25 MG tablet Take 1 tablet (25 mg total) by mouth daily. 90 tablet 3   levothyroxine (SYNTHROID) 125 MCG tablet Take 1 tablet (125 mcg total) by mouth daily. 90 tablet 3   LORazepam (ATIVAN) 0.5 MG tablet Take 1 tablet (0.5 mg total) by mouth 2 (two) times daily as needed for anxiety. 30 tablet 1   losartan (COZAAR) 100 MG tablet Take 1 tablet (100 mg total) by mouth daily. 90 tablet 3   Magnesium Oxide 500 MG TABS Take 1 tablet by mouth daily.     meclizine (ANTIVERT) 25 MG tablet Take  1 tablet (25 mg total) by mouth 3 (three) times daily as needed for dizziness or nausea. 20 tablet 0   mycophenolate (MYFORTIC) 360 MG TBEC EC tablet Take 360 mg by mouth 2 (two) times daily.     nystatin (MYCOSTATIN/NYSTOP) powder Apply daily to breast and abdominal folds after shower     SODIUM BICARBONATE PO Take 650 mg by mouth 2 (two) times daily. Takes 1 in AM and 1 at bedtime     sulfamethoxazole-trimethoprim (BACTRIM DS) 800-160 MG tablet Take 1 tablet by mouth 2 (two) times daily. 6 tablet 0   tacrolimus (PROGRAF) 1 MG capsule Take 4 mg by mouth 2 (two) times daily. Pt takes 3 in the morning and 3 at night.     triamcinolone (NASACORT) 55 MCG/ACT AERO nasal inhaler Place 2 sprays into the nose daily.     triamcinolone cream (KENALOG) 0.1 % Apply 1 application topically daily.     verapamil (CALAN-SR) 120 MG CR tablet TAKE 1 TABLET BY MOUTH EVERY IN  THE AFTERNOON. OFFICE VISIT NEEDED FOR FURTHER REFILLS 90 tablet 0   verapamil (CALAN-SR) 240 MG CR tablet Take 1 tablet (240 mg total) by mouth every morning. 90 tablet 1   Vitamin D, Ergocalciferol, (DRISDOL) 1.25 MG (50000 UNIT) CAPS capsule Take 1  capsule (50,000 Units total) by mouth every 7 (seven) days. 12 capsule 1   No facility-administered medications prior to visit.    Allergies  Allergen Reactions   Labetalol     Other reaction(s): Other   Cefaclor Rash   Naproxen Rash   Enalapril Maleate Cough    Vasotec   Metoprolol Tartrate Rash    On legs    ROS As per HPI  PE:    06/06/2022   10:22 AM 12/24/2021    9:12 AM 12/24/2021    9:11 AM  Vitals with BMI  Height '5\' 5"'$  '5\' 5"'$    Weight 248 lbs 6 oz 248 lbs   BMI 00.76 22.63   Systolic 335  456  Diastolic 79  84  Pulse 70  85     Physical Exam Exam chaperoned by CMA. Gen: Alert, well appearing.  Patient is oriented to person, place, time, and situation. YBW:LSLH: no injection, icteris, swelling, or exudate.  EOMI, PERRLA. Mouth: lips without lesion/swelling.  Oral mucosa pink and moist. Oropharynx without erythema, exudate, or swelling.  NECK: No adenopathy. CV: RRR, no m/r/g.   LUNGS: CTA bilat, nonlabored resps, good aeration in all lung fields. ABD: soft, NT, ND, BS nl Nontender large RLQ abd wall hernia--unchanged    LABS:  Last CBC Lab Results  Component Value Date   WBC 7.6 08/12/2021   HGB 13.1 08/12/2021   HCT 40.8 08/12/2021   MCV 88.8 08/12/2021   MCH 31.8 09/16/2015   RDW 13.4 08/12/2021   PLT 268.0 73/42/8768   Last metabolic panel Lab Results  Component Value Date   GLUCOSE 94 11/16/2021   NA 139 11/16/2021   K 3.7 11/16/2021   CL 102 11/16/2021   CO2 29 11/16/2021   BUN 21 11/16/2021   CREATININE 1.09 11/16/2021   GFRNONAA 37 09/04/2020   CALCIUM 9.5 11/16/2021   PHOS 2.9 01/04/2018   PROT 6.4 08/12/2021   ALBUMIN 3.7 08/12/2021   BILITOT 0.4 08/12/2021   ALKPHOS 76 08/12/2021   AST 13 08/12/2021   ALT 10 08/12/2021     IMPRESSION AND PLAN:  #1 mild malaise, low-grade fever, decreased appetite.  She only had 1 loose  stool which was at the beginning of all this about a week ago. Historically she has had urinary  tract infections that presented with minimal urinary symptoms. Dipstick urinalysis today negative.  #2 bright red blood per rectum, painless. Suspect internal hemorrhoids but she is due for repeat of her screening colonoscopy. She has an appointment with Dr. Henrene Pastor in GI on 07/13/2022. Check CBC today.  #3 chronic renal insufficiency stage III, history of renal transplant. Renal function was stable 5 days ago in the emergency department.  An After Visit Summary was printed and given to the patient.  FOLLOW UP: Return in about 1 week (around 06/13/2022) for f/u dec appetite and BRBPR.  Signed:  Crissie Sickles, MD           06/06/2022

## 2022-06-07 ENCOUNTER — Telehealth: Payer: Self-pay

## 2022-06-07 NOTE — Telephone Encounter (Signed)
Pt advised of results. 

## 2022-06-07 NOTE — Telephone Encounter (Signed)
Patient returning call regarding lab results. Patient is aware of the following results. Copied and pasted from Dr. Anitra Lauth: "Your hemoglobin is excellent. No sign of significant blood loss at all."  No questions or concerns.

## 2022-06-13 ENCOUNTER — Encounter: Payer: Self-pay | Admitting: Family Medicine

## 2022-06-13 ENCOUNTER — Ambulatory Visit (INDEPENDENT_AMBULATORY_CARE_PROVIDER_SITE_OTHER): Payer: Medicare Other | Admitting: Family Medicine

## 2022-06-13 VITALS — BP 125/80 | HR 72 | Temp 97.7°F | Ht 65.0 in | Wt 251.6 lb

## 2022-06-13 DIAGNOSIS — R630 Anorexia: Secondary | ICD-10-CM | POA: Diagnosis not present

## 2022-06-13 DIAGNOSIS — M810 Age-related osteoporosis without current pathological fracture: Secondary | ICD-10-CM | POA: Diagnosis not present

## 2022-06-13 DIAGNOSIS — K625 Hemorrhage of anus and rectum: Secondary | ICD-10-CM

## 2022-06-13 MED ORDER — VERAPAMIL HCL ER 240 MG PO TBCR: 240.0000 mg | EXTENDED_RELEASE_TABLET | Freq: Every morning | ORAL | 3 refills | Status: DC

## 2022-06-13 MED ORDER — ALENDRONATE SODIUM 70 MG PO TABS: 70.0000 mg | ORAL_TABLET | ORAL | 3 refills | Status: DC

## 2022-06-13 MED ORDER — VERAPAMIL HCL ER 120 MG PO TBCR: EXTENDED_RELEASE_TABLET | ORAL | 3 refills | Status: DC

## 2022-06-13 NOTE — Progress Notes (Signed)
OFFICE VISIT  06/13/2022  CC:  Chief Complaint  Patient presents with   Follow-up    Decreased appetite, BRBPR; she is not having any bleeding that she notices   Patient is a 76 y.o. female who presents for 1 week follow-up bright red blood per rectum and malaise. A/P as of last visit: "#1 mild malaise, low-grade fever, decreased appetite.  She only had 1 loose stool which was at the beginning of all this about a week ago. Historically she has had urinary tract infections that presented with minimal urinary symptoms. Dipstick urinalysis today negative.   #2 bright red blood per rectum, painless. Suspect internal hemorrhoids but she is due for repeat of her screening colonoscopy. She has an appointment with Dr. Henrene Pastor in GI on 07/13/2022. Check CBC today.   #3 chronic renal insufficiency stage III, history of renal transplant. Renal function was stable 5 days ago in the emergency department."  INTERIM HX: Hb 13.1 last visit.  She is feeling well.  Appetite is picked back up and she is up 3 pounds. She has had no further rectal bleeding.  Past Medical History:  Diagnosis Date   Anxiety    Cholelithiasis 04/2021   noted on CT abd/pelv done for R lower abd swelling   Chronic renal insufficiency, stage III (moderate) (HCC) 10/2015   Post-transplant baseline sCr 1.2-1.4.     CMV infection [facility]) summer 2017   Valcyte per ID/Renal transplant team   Diverticulosis    a. 05/2012 colonoscopy   Erosive lichen planus of vulva    Topical steroids (managed by Dr. Mila [facility].   ESRD (end stage renal disease) ([place])    began dialysis 2014--followed by Dr. [name].  Received deceased donor kidney transplant 10/2015.   Fatty liver 10/2021   with hepatomegaly   FSGS (focal segmental glomerulosclerosis)    right kidney; hx of left renal cell cancer and got nephrectomy 1993.   GERD (gastroesophageal reflux disease)    Gout    Heart murmur     (Diastolic) ECHO 09/3555 showed that this murmur is coming from pt's R arm AV fistula   Hiatal hernia    History of renal cell cancer 1993   Left nephrectomy   Hyperlipidemia    Hypertension    Hypothyroidism    Impingement syndrome of left shoulder 04/2016   Dr. Christy Sartorius to PT   Lumbar spinal stenosis    Chronic LBP with bilat neurogenic claudication   Metatarsal fracture, pathologic 01/2018   Right; orthocarolina-->post op shoe continued, f/u x-ray planned.   Osteoarthritis of left knee 08/2017   severe, diffuse, tricompartmental.  Responded to steroid injection.     Osteoporosis 06/07/2016   DEXA T-score of -3.1.  Fosamax planned 2017 but dental work prevented start..  Pathologic toe fx 12/2017--repeat DEXA 08/2018, T-score -3.3 radius. 12/2020 T score radius -3.7.   Renal transplant recipient 11/06/2015   Baseline Cr as of 09/2020 1.2-1.4   Simple hepatic cyst    Solitary kidney, acquired    SVT (supraventricular tachycardia)    Vitamin B12 deficiency 12/2018   Starting replacement as of 12/11/2018    Past Surgical History:  Procedure Laterality Date   AV FISTULA PLACEMENT Right    Right arm: aneurismal dilatation 2017 being followed by CV surgeons   CARDIOVASCULAR STRESS TEST     03/10/15 ETT ([facility]): Exercise ECG negative at 83% max predicted HR.    CATARACT EXTRACTION  COLONOSCOPY  2003; 05/2012   diverticulosis, no polyps.  Recall 10 yrs   colonoscopy with polypectomy     Dr Henrene Pastor   DEXA  06/07/2016; 08/22/2018; 12/30/20   2017 T-score -3.1.  2020 T score -3.3.  2022 T score -3.7   KIDNEY TRANSPLANT  12/06/2015   Deceased donor kidney transplant, with Thymoglobulin induction   LIGATION OF ARTERIOVENOUS  FISTULA Right 02/14/2017   Procedure: LIGATION OF ARTERIOVENOUS  FISTULA;  Surgeon: Angelia Mould, MD;  Location: [place];  Service: Vascular;  Laterality: Right;   [place]   for malignancy- left   RENAL BIOPSY     right   RESECTION OF  ARTERIOVENOUS FISTULA ANEURYSM Right 02/14/2017   Procedure: EXCISION OF RIGHT BRACHIOCEPHALIC ARTERIOVENOUS FISTULA ANEURYSM;  Surgeon: Angelia Mould, MD;  Location: [place];  Service: Vascular;  Laterality: Right;   TEE WITHOUT CARDIOVERSION N/A 08/27/2013   Procedure: TRANSESOPHAGEAL ECHOCARDIOGRAM (TEE);  Surgeon: [name], MD;  Location: [facility] ENDOSCOPY;  Service: Cardiovascular;  Laterality: N/A;   TOTAL ABDOMINAL HYSTERECTOMY W/ BILATERAL SALPINGOOPHORECTOMY  1997   fibroids   TRANSTHORACIC ECHOCARDIOGRAM     03/10/15 echo ([facility]): LV cavity normal in size, focal basal hypertrophy, normal systolic function, EF 76% (visual est). Normal wall motion, no regional wall motion abnormalities. Mild diastolic dysfunction with normal LA chamber size. No significant valve stenosis or regurgitation.   VULVA / PERINEUM BIOPSY  2015    Outpatient Medications Prior to Visit  Medication Sig Dispense Refill   aspirin EC 81 MG EC tablet Take 1 tablet (81 mg total) by mouth daily.     AYR SALINE NASAL DROPS NA Place 2 sprays into both nostrils daily.     Camphor-Eucalyptus-Menthol (VICKS VAPORUB EX) Apply 1 application topically daily.     citalopram (CELEXA) 20 MG tablet Take 1 tablet (20 mg total) by mouth daily. 30 tablet 1   Cyanocobalamin (B-12 PO) Take by mouth daily.     doxazosin (CARDURA) 2 MG tablet Take 1 tablet (2 mg total) by mouth 2 (two) times daily. 180 tablet 1   hydrochlorothiazide (HYDRODIURIL) 25 MG tablet Take 1 tablet (25 mg total) by mouth daily. 90 tablet 3   levothyroxine (SYNTHROID) 125 MCG tablet Take 1 tablet (125 mcg total) by mouth daily. 90 tablet 3   LORazepam (ATIVAN) 0.5 MG tablet Take 1 tablet (0.5 mg total) by mouth 2 (two) times daily as needed for anxiety. 30 tablet 1   losartan (COZAAR) 100 MG tablet Take 1 tablet (100 mg total) by mouth daily. 90 tablet 3   Magnesium Oxide 500 MG TABS Take 1 tablet by mouth daily.     meclizine  (ANTIVERT) 25 MG tablet Take 1 tablet (25 mg total) by mouth 3 (three) times daily as needed for dizziness or nausea. 20 tablet 0   mycophenolate (MYFORTIC) 360 MG TBEC EC tablet Take 360 mg by mouth 2 (two) times daily.     nystatin (MYCOSTATIN/NYSTOP) powder Apply daily to breast and abdominal folds after shower     SODIUM BICARBONATE PO Take 650 mg by mouth 2 (two) times daily. Takes 1 in AM and 1 at bedtime     sulfamethoxazole-trimethoprim (BACTRIM DS) 800-160 MG tablet Take 1 tablet by mouth 2 (two) times daily. 6 tablet 0   tacrolimus (PROGRAF) 1 MG capsule Take 4 mg by mouth 2 (two) times daily. Pt takes 3 in the morning and 3 at night.  triamcinolone (NASACORT) 55 MCG/ACT AERO nasal inhaler Place 2 sprays into the nose daily.     triamcinolone cream (KENALOG) 0.1 % Apply 1 application topically daily.     Vitamin D, Ergocalciferol, (DRISDOL) 1.25 MG (50000 UNIT) CAPS capsule Take 1 capsule (50,000 Units total) by mouth every 7 (seven) days. 12 capsule 1   alendronate (FOSAMAX) 70 MG tablet Take 1 tablet (70 mg total) by mouth every 7 (seven) days. Take with a full glass of water on an empty stomach. 12 tablet 3   verapamil (CALAN-SR) 120 MG CR tablet TAKE 1 TABLET BY MOUTH EVERY IN  THE AFTERNOON. OFFICE VISIT NEEDED FOR FURTHER REFILLS 90 tablet 0   verapamil (CALAN-SR) 240 MG CR tablet Take 1 tablet (240 mg total) by mouth every morning. 90 tablet 1   No facility-administered medications prior to visit.    Allergies  Allergen Reactions   Labetalol     Other reaction(s): Other   Cefaclor Rash   Naproxen Rash   Enalapril Maleate Cough    Vasotec   Metoprolol Tartrate Rash    On legs    ROS As per HPI  PE:    06/13/2022    8:58 AM 06/06/2022   10:22 AM 12/24/2021    9:12 AM  Vitals with BMI  Height '5\' 5"'$  '5\' 5"'$  '5\' 5"'$   Weight 251 lbs 10 oz 248 lbs 6 oz 248 lbs  BMI 41.87 62.22 97.98  Systolic 921 194   Diastolic 80 79   Pulse 72 70     Physical Exam  Gen: Alert,  well appearing.  Patient is oriented to person, place, time, and situation. AFFECT: pleasant, lucid thought and speech. CV: RRR, no m/r/g.   LUNGS: CTA bilat, nonlabored resps, good aeration in all lung fields. EXT: no clubbing or cyanosis.  no edema.    LABS:  Last metabolic panel Lab Results  Component Value Date   GLUCOSE 94 11/16/2021   NA 139 11/16/2021   K 3.7 11/16/2021   CL 102 11/16/2021   CO2 29 11/16/2021   BUN 21 11/16/2021   CREATININE 1.09 11/16/2021   GFRNONAA 37 09/04/2020   CALCIUM 9.5 11/16/2021   PHOS 2.9 01/04/2018   PROT 6.4 08/12/2021   ALBUMIN 3.7 08/12/2021   BILITOT 0.4 08/12/2021   ALKPHOS 76 08/12/2021   AST 13 08/12/2021   ALT 10 08/12/2021   Lab Results  Component Value Date   WBC 8.0 06/06/2022   HGB 13.0 06/06/2022   HCT 39.8 06/06/2022   MCV 91.3 06/06/2022   PLT 274.0 06/06/2022   IMPRESSION AND PLAN:  #1 painless rectal bleeding. Resolved. Hemoglobin did not drop. She has plans to follow-up with her gastroenterologist next month--07/13/22.  #2 process.  Fosamax planned 2017 but dental work prevented start..  Pathological toe fx 12/2017--repeat DEXA 08/2018, T-score -3.3 radius. 12/2020 T score radius -3.7. She states today that she has not been taking her Fosamax at all.  She will start this now though.  Therapeutic expectations and side effect profile of medication discussed today.  Patient's questions answered. Plan repeat DEXA 2 years.  An After Visit Summary was printed and given to the patient.  FOLLOW UP: Return in about 3 months (around 09/13/2022) for annual CPE (fasting).  Signed:  Crissie Sickles, MD           06/13/2022

## 2022-06-19 ENCOUNTER — Other Ambulatory Visit: Payer: Self-pay | Admitting: Family Medicine

## 2022-06-28 DIAGNOSIS — Z940 Kidney transplant status: Secondary | ICD-10-CM | POA: Diagnosis not present

## 2022-06-30 ENCOUNTER — Other Ambulatory Visit: Payer: Self-pay | Admitting: Family Medicine

## 2022-07-13 ENCOUNTER — Encounter: Payer: Self-pay | Admitting: Internal Medicine

## 2022-07-13 ENCOUNTER — Ambulatory Visit (INDEPENDENT_AMBULATORY_CARE_PROVIDER_SITE_OTHER): Payer: Medicare Other | Admitting: Internal Medicine

## 2022-07-13 VITALS — BP 132/84 | HR 78 | Ht 65.0 in | Wt 252.0 lb

## 2022-07-13 DIAGNOSIS — K439 Ventral hernia without obstruction or gangrene: Secondary | ICD-10-CM | POA: Diagnosis not present

## 2022-07-13 DIAGNOSIS — K625 Hemorrhage of anus and rectum: Secondary | ICD-10-CM

## 2022-07-13 DIAGNOSIS — Z1211 Encounter for screening for malignant neoplasm of colon: Secondary | ICD-10-CM

## 2022-07-13 MED ORDER — NA SULFATE-K SULFATE-MG SULF 17.5-3.13-1.6 GM/177ML PO SOLN: 1.0000 | Freq: Once | ORAL | 0 refills | Status: AC

## 2022-07-13 NOTE — Progress Notes (Signed)
HISTORY OF PRESENT ILLNESS:  Tabitha Green is a 76 y.o. female with multiple medical problems including prior renal transplantation who is sent today by her primary care provider regarding rectal bleeding and the need for colonoscopy.  I last saw the patient June 07, 2012 when she underwent colonoscopy for history of colon polyps.  She was found to have severe diverticulosis throughout the entire colon.  She has not been seen since.  Since that time the patient has undergone successful renal transplantation.  Unfortunately, she has a large right-sided ventral hernia.  This does not cause any significant symptoms.  This was noticed on previous CT scan (reviewed).  Patient reports that in late October she developed significant painless rectal bleeding with defecation.  This occurred over the course of 1 week.  This has resolved without recurrence.  No associated abdominal pain or weight loss.  She did see her PCP most recently regarding this, for follow-up, June 13, 2022.  Reviewed.  Blood work from June 01, 2022 revealed a hemoglobin of 12.6.  Follow-up hemoglobin June 06, 2022 was 13.0.  Patient is not on blood thinners.  REVIEW OF SYSTEMS:  All non-GI ROS negative as otherwise stated in the HPI except for sinus and allergy, anxiety, arthritis, back pain, fatigue, urinary leakage Past Medical History:  Diagnosis Date   Anxiety    Arthritis    Cholelithiasis 04/2021   noted on CT abd/pelv done for R lower abd swelling and MR abd   Chronic renal insufficiency, stage III (moderate) ([facility]) 10/2015   Post-transplant baseline sCr 1.2-1.4.     CMV infection [facility]) summer 2017   Valcyte per ID/Renal transplant team   Depression    Diverticulosis    a. 05/2012 colonoscopy   Erosive lichen planus of vulva    Topical steroids (managed by Tabitha. Mila [facility] outpt services.   ESRD (end stage renal disease) ([place])    began dialysis 2014--followed by Tabitha Green.   Received deceased donor kidney transplant 10/2015.   Fatty liver 10/2021   with hepatomegaly   FSGS (focal segmental glomerulosclerosis)    right kidney; hx of left renal cell cancer and got nephrectomy 1993.   GERD (gastroesophageal reflux disease)    Gout    Heart murmur    (Diastolic) ECHO 11/4313 showed that this murmur is coming from pt's R arm AV fistula   Hiatal hernia    History of renal cell cancer 1993   Left nephrectomy   Hyperlipidemia    Hypertension    Hypothyroidism    Impingement syndrome of left shoulder 04/2016   Tabitha Green to PT   Kidney cancer, primary, with metastasis from kidney to other site [facility])    Lumbar spinal stenosis    Chronic LBP with bilat neurogenic claudication   Metatarsal fracture, pathologic 01/2018   Right; orthocarolina-->post op shoe continued, f/u x-ray planned.   Obesity    Osteoarthritis of left knee 08/2017   severe, diffuse, tricompartmental.  Responded to steroid injection.     Osteoporosis 06/07/2016   DEXA T-score of -3.1.  Fosamax planned 2017 but dental work prevented start..  Pathological toe fx 12/2017--repeat DEXA 08/2018, T-score -3.3 radius. 12/2020 T score radius -3.7.   Pneumonia    Renal transplant recipient 11/06/2015   Baseline Cr as of 09/2020 1.2-1.4   Simple hepatic cyst    2022   Solitary kidney, acquired    SVT (supraventricular tachycardia)    UTI (urinary tract infection)  Vitamin B12 deficiency 12/2018   Starting replacement as of 12/11/2018    Past Surgical History:  Procedure Laterality Date   AV FISTULA PLACEMENT Right    Right arm: aneurismal dilatation 2017 being followed by CV surgeons   CARDIOVASCULAR STRESS TEST     03/10/15 ETT (Tabitha Green): Exercise ECG negative at 83% max predicted HR.    CATARACT EXTRACTION     COLONOSCOPY  2003; 05/2012   diverticulosis, no polyps.  Recall 10 yrs   colonoscopy with polypectomy     Tabitha Green   DEXA  06/07/2016; 08/22/2018; 12/30/20   2017 T-score  -3.1.  2020 T score -3.3.  2022 T score -3.7   HYSTERECTOMY ABDOMINAL WITH SALPINGECTOMY     KIDNEY TRANSPLANT  November 20, 2015   Deceased donor kidney transplant, with Thymoglobulin induction   LIGATION OF ARTERIOVENOUS  FISTULA Right 02/14/2017   Procedure: LIGATION OF ARTERIOVENOUS  FISTULA;  Surgeon: Angelia Mould, Green;  Location: [facility];  Service: Vascular;  Laterality: Right;   Tabitha Green   for malignancy- left   RENAL BIOPSY     right   RESECTION OF ARTERIOVENOUS FISTULA ANEURYSM Right 02/14/2017   Procedure: EXCISION OF RIGHT BRACHIOCEPHALIC ARTERIOVENOUS FISTULA ANEURYSM;  Surgeon: Angelia Mould, Green;  Location: [place];  Service: Vascular;  Laterality: Right;   TEE WITHOUT CARDIOVERSION N/A 08/27/2013   Procedure: TRANSESOPHAGEAL ECHOCARDIOGRAM (TEE);  Surgeon: Tabitha Green;  Location: [facility];  Service: Cardiovascular;  Laterality: N/A;   TOTAL ABDOMINAL HYSTERECTOMY W/ BILATERAL SALPINGOOPHORECTOMY  1997   fibroids   TRANSTHORACIC ECHOCARDIOGRAM     03/10/15 echo (Tabitha Green, [facility]itha Green): LV cavity normal in size, focal basal hypertrophy, normal systolic function, EF 28% (visual est). Normal wall motion, no regional wall motion abnormalities. Mild diastolic dysfunction with normal LA chamber size. No significant valve stenosis or regurgitation.   VULVA / PERINEUM BIOPSY  2015    Social History [name]  reports that she has never smoked. She has never used smokeless tobacco. She reports current alcohol use. She reports that she does not use drugs.  family history includes Cancer in her paternal aunt and sister; Deep vein thrombosis in her mother; Diabetes in her maternal grandfather; Fibroids in her daughter; Heart attack (age of onset: 56) in her father; Hypertension in her father and mother; Stroke in her paternal grandmother.  Allergies  Allergen Reactions   Labetalol     Other reaction(s): Other   Cefaclor Rash   Naproxen Rash    Enalapril Maleate Cough    Vasotec   Metoprolol Tartrate Rash    On legs       PHYSICAL EXAMINATION: Vital signs: BP 132/84   Pulse 78   Ht '5\' 5"'$  (1.651 m)   Wt 252 lb (114.3 kg)   BMI 41.93 kg/m   Constitutional: Pleasant, obese, elderly female, no acute distress Psychiatric: alert and oriented x3, cooperative Eyes: extraocular movements intact, anicteric, conjunctiva pink Mouth: oral pharynx moist, no lesions Neck: supple no lymphadenopathy Cardiovascular: heart regular rate and rhythm, no murmur Lungs: clear to auscultation bilaterally Abdomen: soft, obese, nontender, nondistended, no obvious ascites, no peritoneal signs, normal bowel sounds, no organomegaly.  Large right-sided ventral hernia without tenderness Rectal: Deferred to colonoscopy Extremities: no clubbing or cyanosis.  Trace lower extremity edema bilaterally Skin: no lesions on visible extremities Neuro: No focal deficits.  Cranial nerves intact  ASSESSMENT:  1.  1 week history of painless rectal bleeding.  Stable hemoglobin.  Suspect benign anorectal cause such as hemorrhoids.  Rule out neoplasia.  May have been low-grade diverticular bleed. 2.  Colonoscopy 2013 pandiverticulosis 3.  Large ventral hernia 4.  Multiple significant medical problems.  Stable 5.  Morbid obesity   PLAN:  1.  Schedule colonoscopy.  The patient is HIGH RISK given her comorbidities, the ventral hernia, and obesity.The nature of the procedure, as well as the risks, benefits, and alternatives were carefully and thoroughly reviewed with the patient.  She was also informed that the examination may be compromised or incomplete due to the presence of the large hernia.  Ample time for discussion and questions allowed. The patient understood, was satisfied, and agreed to proceed. 2.  Further recommendations after the above completed

## 2022-07-13 NOTE — Patient Instructions (Signed)
_______________________________________________________  If you are age 76 or older, your body mass index should be between 23-30. Your Body mass index is 41.93 kg/m. If this is out of the aforementioned range listed, please consider follow up with your Primary Care Provider.  If you are age 41 or younger, your body mass index should be between 19-25. Your Body mass index is 41.93 kg/m. If this is out of the aformentioned range listed, please consider follow up with your Primary Care Provider.   ________________________________________________________  The [facility] providers would like to encourage you to use [facility] to communicate with providers for non-urgent requests or questions.  Due to long hold times on the telephone, sending your provider a message by [facility] may be a faster and more efficient way to get a response.  Please allow 48 business hours for a response.  Please remember that this is for non-urgent requests.  _______________________________________________________  Tabitha Green have been scheduled for a colonoscopy. Please follow written instructions given to you at your visit today.  Please pick up your prep supplies at the pharmacy within the next 1-3 days. If you use inhalers (even only as needed), please bring them with you on the day of your procedure.

## 2022-07-26 DIAGNOSIS — L438 Other lichen planus: Secondary | ICD-10-CM | POA: Diagnosis not present

## 2022-07-26 DIAGNOSIS — L304 Erythema intertrigo: Secondary | ICD-10-CM | POA: Diagnosis not present

## 2022-07-27 DIAGNOSIS — U071 COVID-19: Secondary | ICD-10-CM | POA: Diagnosis not present

## 2022-07-27 DIAGNOSIS — H9203 Otalgia, bilateral: Secondary | ICD-10-CM | POA: Diagnosis not present

## 2022-07-31 ENCOUNTER — Ambulatory Visit: Admit: 2022-07-31 | Payer: Medicare Other

## 2022-08-02 ENCOUNTER — Ambulatory Visit
Admission: RE | Admit: 2022-08-02 | Discharge: 2022-08-02 | Disposition: A | Discharge status: Home / Self Care | Payer: Medicare Other | Source: Ambulatory Visit | Attending: Urgent Care | Admitting: Urgent Care

## 2022-08-02 ENCOUNTER — Ambulatory Visit (INDEPENDENT_AMBULATORY_CARE_PROVIDER_SITE_OTHER): Payer: Medicare Other

## 2022-08-02 VITALS — BP 106/75 | HR 81 | Temp 99.3°F | Resp 18

## 2022-08-02 DIAGNOSIS — R509 Fever, unspecified: Secondary | ICD-10-CM | POA: Diagnosis not present

## 2022-08-02 DIAGNOSIS — J189 Pneumonia, unspecified organism: Secondary | ICD-10-CM

## 2022-08-02 DIAGNOSIS — U071 COVID-19: Secondary | ICD-10-CM | POA: Diagnosis not present

## 2022-08-02 MED ORDER — AZITHROMYCIN 250 MG PO TABS: 250.0000 mg | ORAL_TABLET | Freq: Every day | ORAL | 0 refills | Status: DC

## 2022-08-02 MED ORDER — AMOXICILLIN-POT CLAVULANATE 875-125 MG PO TABS: 1.0000 | ORAL_TABLET | Freq: Two times a day (BID) | ORAL | 0 refills | Status: AC

## 2022-08-02 NOTE — Discharge Instructions (Signed)
Your chest x-ray shows evidence of pneumonia. I have called in 2 different antibiotics that are required to take concurrently in order to get rid of this. Please take the Augmentin twice daily with food. Please schedule follow-up with your PCP in 1 week. Please take over-the-counter Mucinex 600 mg twice daily to help break up the mucus. Do not take cough suppressants as this may hinder improvement. If you develop any new symptoms or worsening symptoms, please head to the emergency room

## 2022-08-02 NOTE — ED Triage Notes (Signed)
Patient presents to UC for continued fever, fatigue, nasal congestion, cough x 1 week. Treating symptoms with ibuprofen/tylenol. She states she did not meet criteria for covid treatment due to her kidney transplant hx.

## 2022-08-02 NOTE — ED Provider Notes (Signed)
[name]    CSN: [id number] Arrival date & time: 08/02/22  0855      History   Chief Complaint Chief Complaint  Patient presents with   Nasal Congestion    Need blood oxygen level checked Covid - Entered by patient   Fever   Fatigue   Appointment    0900    HPI Tabitha Green is a 76 y.o. female.   Pleasant 76 year old female presents today due to concerns of continued fever and congestion.  1 week ago, patient was diagnosed with COVID.  She did not take any antivirals because she was diagnosed too long into her symptoms.  She also is a kidney transplant patient, transplant done in 2017.  Patient is concerned because since having COVID, she is continue to have an elevated temperature around 100.9 daily.  She took Tylenol this morning, temp in office 99.3.  She continues to have a cough, primarily dry, with intermittent mucus production.  She has an O2 monitor at home, she states she has been monitoring this with levels ranging between 90-95.  She denies any chronic lung or pulmonary issues.  She does not smoke.   Fever   Past Medical History:  Diagnosis Date   Anxiety    Arthritis    Cholelithiasis 04/2021   noted on CT abd/pelv done for R lower abd swelling and MR abd   Chronic renal insufficiency, stage III (moderate) ([place]) 10/2015   Post-transplant baseline sCr 1.2-1.4.     CMV infection [facility]) summer 2017   Valcyte per ID/Renal transplant team   Depression    Diverticulosis    a. 05/2012 colonoscopy   Erosive lichen planus of vulva    Topical steroids (managed by Dr. Mila Palmer [facility] via [facility].   ESRD (end stage renal disease) ([place])    began dialysis 2014--followed by Dr. [name].  Received deceased donor kidney transplant 10/2015.   Fatty liver 10/2021   with hepatomegaly   FSGS (focal segmental glomerulosclerosis)    right kidney; hx of left renal cell cancer and got nephrectomy 1993.   GERD (gastroesophageal  reflux disease)    Gout    Heart murmur    (Diastolic) ECHO 09/6832 showed that this murmur is coming from pt's R arm AV fistula   Hiatal hernia    History of renal cell cancer 1993   Left nephrectomy   Hyperlipidemia    Hypertension    Hypothyroidism    Impingement syndrome of left shoulder 04/2016   Dr. Christy Sartorius to PT   Kidney cancer, primary, with metastasis from kidney to other site [facility])    Lumbar spinal stenosis    Chronic LBP with bilat neurogenic claudication   Metatarsal fracture, pathologic 01/2018   Right; orthocarolina-->post op shoe continued, f/u x-ray planned.   Obesity    Osteoarthritis of left knee 08/2017   severe, diffuse, tricompartmental.  Responded to steroid injection.     Osteoporosis 06/07/2016   DEXA T-score of -3.1.  Fosamax planned 2017 but dental work prevented start..  Pathological toe fx 12/2017--repeat DEXA 08/2018, T-score -3.3 radius. 12/2020 T score radius -3.7.   Pneumonia    Renal transplant recipient 11/06/2015   Baseline Cr as of 09/2020 1.2-1.4   Simple hepatic cyst    2022   Solitary kidney, acquired    SVT (supraventricular tachycardia)    UTI (urinary tract infection)    Vitamin B12 deficiency 12/2018   Starting replacement as of 12/11/2018  Patient Active Problem List   Diagnosis Date Noted   Combined forms of age-related cataract of right eye 10/20/2020   COVID-19 08/06/2020   Lumbar spinal stenosis 02/05/2019   Cherry angioma 01/14/2018   Lentigines 01/14/2018   Multiple benign nevi 01/14/2018   Pain in left knee 08/28/2017   Seborrheic keratosis 11/23/2016   Left shoulder pain 05/02/2016   Wheeze 09/16/2015   Cough 09/16/2015   Acute bronchitis 09/16/2015   History of renal carcinoma 05/14/2015   Vulvar ulceration 06/22/2014   End stage renal disease ([place]) 04/09/2014   Mechanical complication of other vascular device, implant, and graft 04/09/2014   Cervical spondylosis 10/10/2013   Aortic insufficiency  08/20/2013   Hyperglycemia 12/14/2012   SVT (supraventricular tachycardia) 12/13/2012   Uremia 12/13/2012   Chronic kidney disease (CKD), stage IV (severe) ([place]) 12/07/2012   Bradycardia 12/07/2012   VITAMIN D DEFICIENCY 09/08/2009   CHRON GLOMERULONEPHRIT W/LES MEMBRANOUS GLN 09/08/2009   FATIGUE 09/08/2009   DIVERTICULOSIS, COLON 09/26/2008   OSTEOPENIA 09/26/2008   COLONIC POLYPS, HX OF 09/26/2008   Hypothyroidism 10/02/2007   OTHER AND UNSPECIFIED HYPERLIPIDEMIA 10/02/2007   BENIGN POSITIONAL VERTIGO 10/02/2007   Unspecified essential hypertension 10/02/2007   Gout, unspecified 03/26/2007   DISORDER, DYSMETABOLIC SYNDROME X 05/24/5101   HX, PERSONAL, MALIGNANCY, KIDNEY 03/26/2007   OSTEOARTHRITIS 12/26/2006    Past Surgical History:  Procedure Laterality Date   AV FISTULA PLACEMENT Right    Right arm: aneurismal dilatation 2017 being followed by CV surgeons   CARDIOVASCULAR STRESS TEST     03/10/15 ETT ([facility]): Exercise ECG negative at 83% max predicted HR.    CATARACT EXTRACTION     COLONOSCOPY  2003; 05/2012   diverticulosis, no polyps.  Recall 10 yrs   colonoscopy with polypectomy     Dr Henrene Pastor   DEXA  06/07/2016; 08/22/2018; 12/30/20   2017 T-score -3.1.  2020 T score -3.3.  2022 T score -3.7   HYSTERECTOMY ABDOMINAL WITH SALPINGECTOMY     KIDNEY TRANSPLANT  11-13-15   Deceased donor kidney transplant, with Thymoglobulin induction   LIGATION OF ARTERIOVENOUS  FISTULA Right 02/14/2017   Procedure: LIGATION OF ARTERIOVENOUS  FISTULA;  Surgeon: Angelia Mould, MD;  Location: [place];  Service: Vascular;  Laterality: Right;   [place]   for malignancy- left   RENAL BIOPSY     right   RESECTION OF ARTERIOVENOUS FISTULA ANEURYSM Right 02/14/2017   Procedure: EXCISION OF RIGHT BRACHIOCEPHALIC ARTERIOVENOUS FISTULA ANEURYSM;  Surgeon: Angelia Mould, MD;  Location: [place];  Service: Vascular;  Laterality: Right;   TEE WITHOUT CARDIOVERSION N/A  08/27/2013   Procedure: TRANSESOPHAGEAL ECHOCARDIOGRAM (TEE);  Surgeon: [name], MD;  Location[facility];  Service: Cardiovascular;  Laterality: N/A;   TOTAL ABDOMINAL HYSTERECTOMY W/ BILATERAL SALPINGOOPHORECTOMY  1997   fibroids   TRANSTHORACIC ECHOCARDIOGRAM     03/10/15 echo ([facility]): LV cavity normal in size, focal basal hypertrophy, normal systolic function, EF 58% (visual est). Normal wall motion, no regional wall motion abnormalities. Mild diastolic dysfunction with normal LA chamber size. No significant valve stenosis or regurgitation.   VULVA / PERINEUM BIOPSY  2015    OB History     Gravida  2   Para  2   Term  2   Preterm  0   AB  0   Living  2      SAB  0   IAB  0   Ectopic  0   Multiple  0   Live Births               Home Medications    Prior to Admission medications   Medication Sig Start Date End Date Taking? Authorizing Provider  amoxicillin-clavulanate (AUGMENTIN) 875-125 MG tablet Take 1 tablet by mouth every 12 (twelve) hours for 7 days. 08/02/22 08/09/22 Yes Carlise Stofer L, PA  azithromycin (ZITHROMAX) 250 MG tablet Take 1 tablet (250 mg total) by mouth daily. Take first 2 tablets together, then 1 every day until finished. 08/02/22  Yes Alastair Hennes L, PA  alendronate (FOSAMAX) 70 MG tablet Take 1 tablet (70 mg total) by mouth every 7 (seven) days. Take with a full glass of water on an empty stomach. 06/13/22   McGowen, Adrian Blackwater, MD  aspirin EC 81 MG EC tablet Take 1 tablet (81 mg total) by mouth daily. 12/09/12   Edmisten, Brooke O, PA-C  AYR SALINE NASAL DROPS NA Place 2 sprays into both nostrils daily.    [provider]  Camphor-Eucalyptus-Menthol (VICKS VAPORUB EX) Apply 1 application topically daily.    [provider]  citalopram (CELEXA) 20 MG tablet Take 1 tablet (20 mg total) by mouth daily. 08/12/21   McGowen, Adrian Blackwater, MD  Cyanocobalamin (B-12 PO) Take by mouth daily.    [provider]  doxazosin (CARDURA) 2 MG tablet TAKE 1 TABLET BY MOUTH TWICE  DAILY 06/20/22   McGowen, Adrian Blackwater, MD  hydrochlorothiazide (HYDRODIURIL) 25 MG tablet Take 1 tablet (25 mg total) by mouth daily. 08/12/21   McGowen, Adrian Blackwater, MD  levothyroxine (SYNTHROID) 125 MCG tablet Take 1 tablet (125 mcg total) by mouth daily. 08/12/21   McGowen, Adrian Blackwater, MD  LORazepam (ATIVAN) 0.5 MG tablet Take 1 tablet (0.5 mg total) by mouth 2 (two) times daily as needed for anxiety. 11/16/21   McGowen, Adrian Blackwater, MD  losartan (COZAAR) 100 MG tablet Take 1 tablet (100 mg total) by mouth daily. 08/12/21   McGowen, Adrian Blackwater, MD  Magnesium Oxide 500 MG TABS Take 1 tablet by mouth daily.    [provider]  meclizine (ANTIVERT) 25 MG tablet Take 1 tablet (25 mg total) by mouth 3 (three) times daily as needed for dizziness or nausea. 07/30/15   Noe Gens, PA-C  mycophenolate (MYFORTIC) 360 MG TBEC EC tablet Take 360 mg by mouth 2 (two) times daily.    [provider]  nystatin (MYCOSTATIN/NYSTOP) powder Apply daily to breast and abdominal folds after shower 07/20/21   [provider]  SODIUM BICARBONATE PO Take 650 mg by mouth 2 (two) times daily. Takes 1 in AM and 1 at bedtime    [provider]  tacrolimus (PROGRAF) 1 MG capsule Take 4 mg by mouth 2 (two) times daily. Pt takes 3 in the morning and 3 at night.    [provider]  triamcinolone (NASACORT) 55 MCG/ACT AERO nasal inhaler Place 2 sprays into the nose daily.    [provider]  triamcinolone cream (KENALOG) 0.1 % Apply 1 application topically daily.    [provider]  verapamil (CALAN-SR) 120 MG CR tablet TAKE 1 TABLET BY MOUTH EVERY IN  THE AFTERNOON. OFFICE VISIT NEEDED FOR FURTHER REFILLS 06/13/22   McGowen, Adrian Blackwater, MD  verapamil (CALAN-SR) 240 MG CR tablet Take 1 tablet (240 mg total) by mouth every morning. 06/13/22   McGowen, Adrian Blackwater, MD  Vitamin D, Ergocalciferol, (DRISDOL) 1.25 MG  (50000 UNIT)  CAPS capsule Take 1 capsule (50,000 Units total) by mouth every 7 (seven) days. Patient not taking: Reported on 07/13/2022 11/18/21   McGowen, Adrian Blackwater, MD    Family History Family History  Problem Relation Age of Onset   Deep vein thrombosis Mother        post thyroid surgery   Hypertension Mother    Heart attack Father 41       deceased   Hypertension Father    Cancer Sister        breast   Cancer Paternal Aunt        pancreatic   Diabetes Maternal Grandfather    Stroke Paternal Grandmother        in 74s   Fibroids Daughter    Colon cancer Neg Hx    Esophageal cancer Neg Hx    Stomach cancer Neg Hx    Rectal cancer Neg Hx    Sleep apnea Neg Hx     Social History Social History   Tobacco Use   Smoking status: Never   Smokeless tobacco: Never  Vaping Use   Vaping Use: Never used  Substance Use Topics   Alcohol use: Yes    Comment: rarely   Drug use: No     Allergies   Labetalol, Cefaclor, Naproxen, Enalapril maleate, and Metoprolol tartrate   Review of Systems Review of Systems  Constitutional:  Positive for fever.     Physical Exam Triage Vital Signs ED Triage Vitals  Enc Vitals Group     BP 08/02/22 0912 106/75     Pulse Rate 08/02/22 0912 81     Resp 08/02/22 0912 18     Temp 08/02/22 0912 99.3 F (37.4 C)     Temp Source 08/02/22 0912 Oral     SpO2 08/02/22 0912 94 %     Weight --      Height --      Head Circumference --      Peak Flow --      Pain Score 08/02/22 0911 0     Pain Loc --      Pain Edu? --      Excl. in [place]? --    No data found.  Updated Vital Signs BP 106/75 (BP Location: Left Arm)   Pulse 81   Temp 99.3 F (37.4 C) (Oral)   Resp 18   SpO2 94%   Visual Acuity Right Eye Distance:   Left Eye Distance:   Bilateral Distance:    Right Eye Near:   Left Eye Near:    Bilateral Near:     Physical Exam Vitals and nursing note reviewed.  Constitutional:      General: She is not in acute distress.     Appearance: She is well-developed. She is obese. She is ill-appearing. She is not toxic-appearing or diaphoretic.  HENT:     Head: Normocephalic and atraumatic.     Right Ear: Tympanic membrane, ear canal and external ear normal. There is no impacted cerumen.     Left Ear: Tympanic membrane, ear canal and external ear normal. There is no impacted cerumen.     Nose: Nose normal. No congestion or rhinorrhea.     Mouth/Throat:     Mouth: Mucous membranes are moist.     Pharynx: Oropharynx is clear. No oropharyngeal exudate or posterior oropharyngeal erythema.  Eyes:     General: No scleral icterus.       Right eye: No discharge.  Left eye: No discharge.     Extraocular Movements: Extraocular movements intact.     Conjunctiva/sclera: Conjunctivae normal.     Pupils: Pupils are equal, round, and reactive to light.  Cardiovascular:     Rate and Rhythm: Normal rate and regular rhythm.     Heart sounds: No murmur heard. Pulmonary:     Effort: Pulmonary effort is normal. No accessory muscle usage, respiratory distress or retractions.     Breath sounds: Normal air entry. No stridor, decreased air movement or transmitted upper airway sounds. Rhonchi present. No decreased breath sounds, wheezing or rales.  Abdominal:     Palpations: Abdomen is soft.     Tenderness: There is no abdominal tenderness.  Musculoskeletal:        General: No swelling.     Cervical back: Normal range of motion and neck supple. No rigidity or tenderness.  Lymphadenopathy:     Cervical: No cervical adenopathy.  Skin:    General: Skin is warm and dry.     Capillary Refill: Capillary refill takes less than 2 seconds.     Coloration: Skin is not jaundiced.     Findings: No bruising or rash.  Neurological:     General: No focal deficit present.     Mental Status: She is alert and oriented to person, place, and time.  Psychiatric:        Mood and Affect: Mood normal.      UC Treatments / Results  Labs (all  labs ordered are listed, but only abnormal results are displayed) Labs Reviewed - No data to display  EKG   Radiology DG Chest 2 View  Result Date: 08/02/2022 CLINICAL DATA:  COVID-19 for 1 week.  Continue fever EXAM: CHEST - 2 VIEW COMPARISON:  09/16/2015 FINDINGS: Bilateral mild interstitial thickening most prominent along the right lateral chest. No pleural effusion or pneumothorax. Heart and mediastinal contours are unremarkable. No acute osseous abnormality. IMPRESSION: 1. Bilateral mild interstitial thickening most prominent along the right lateral chest concerning for interstitial infection. Electronically Signed   By: Kathreen Devoid M.D.   On: 08/02/2022 10:16    Procedures Procedures (including critical care time)  Medications Ordered in UC Medications - No data to display  Initial Impression / Assessment and Plan / UC Course  I have reviewed the triage vital signs and the nursing notes.  Pertinent labs & imaging results that were available during my care of the patient were reviewed by me and considered in my medical decision making (see chart for details).     Pneumonia B lower lobes -post COVID bilateral pneumonia.  Will discharge patient home on dual therapy of azithromycin and Augmentin.  Encouraged plenty of fluids and Mucinex use.  Patient to follow-up with her PCP in 1 week for recheck, any new or worsening symptoms to the emergency room.   Final Clinical Impressions(s) / UC Diagnoses   Final diagnoses:  Pneumonia of both lower lobes due to infectious organism     Discharge Instructions      Your chest x-ray shows evidence of pneumonia. I have called in 2 different antibiotics that are required to take concurrently in order to get rid of this. Please take the Augmentin twice daily with food. Please schedule follow-up with your PCP in 1 week. Please take over-the-counter Mucinex 600 mg twice daily to help break up the mucus. Do not take cough suppressants as  this may hinder improvement. If you develop any new symptoms or worsening symptoms, please  head to the emergency room     ED Prescriptions     Medication Sig Dispense Auth. Provider   azithromycin (ZITHROMAX) 250 MG tablet Take 1 tablet (250 mg total) by mouth daily. Take first 2 tablets together, then 1 every day until finished. 6 tablet Baneen Wieseler L, PA   amoxicillin-clavulanate (AUGMENTIN) 875-125 MG tablet Take 1 tablet by mouth every 12 (twelve) hours for 7 days. 14 tablet Ayomikun Starling L, [place]      PDMP not reviewed this encounter.   Chaney Malling, [place] 08/02/22 1023

## 2022-08-03 ENCOUNTER — Telehealth: Payer: Self-pay | Admitting: Emergency Medicine

## 2022-08-03 ENCOUNTER — Encounter: Payer: Self-pay | Admitting: Family Medicine

## 2022-08-03 ENCOUNTER — Ambulatory Visit (INDEPENDENT_AMBULATORY_CARE_PROVIDER_SITE_OTHER): Payer: Medicare Other | Admitting: Family Medicine

## 2022-08-03 ENCOUNTER — Ambulatory Visit: Payer: Medicare Other

## 2022-08-03 VITALS — BP 117/71 | HR 70 | Temp 98.1°F | Ht 65.0 in | Wt 245.6 lb

## 2022-08-03 DIAGNOSIS — D849 Immunodeficiency, unspecified: Secondary | ICD-10-CM | POA: Diagnosis not present

## 2022-08-03 DIAGNOSIS — J189 Pneumonia, unspecified organism: Secondary | ICD-10-CM | POA: Diagnosis not present

## 2022-08-03 DIAGNOSIS — U071 COVID-19: Secondary | ICD-10-CM

## 2022-08-03 NOTE — Telephone Encounter (Signed)
Spoke with patient states that she is doing ok, taken medication as prescribed .  Will continue to rest and hydrate.  Patient will follow up as needed.

## 2022-08-03 NOTE — Progress Notes (Signed)
OFFICE VISIT  08/03/2022  CC:  Chief Complaint  Patient presents with   ED follow up    Discuss dx of pneumonia    Patient is a 76 y.o. female who presents accompanied by her daughter for follow-up recent diagnosis of pneumonia.  HPI: Patient went to [facility] urgent care yesterday for ongoing respiratory symptoms and fever.  She was diagnosed with COVID approximately 1 week ago.  She was too far into the process to take antiviral.  Patient reported O2 sat at home 90 to 95% range on room air. Chest x-ray in the emergency department showed " bilateral mild interstitial thickening most prominent along the right lateral chest concerning for interstitial infection". She was discharged from the emergency department to home on Augmentin and azithromycin.  UPDATE: Onset of her illness was about 10 days ago. Had mainly fatigue and sore throat, then presented to urgent care where she was diagnosed with COVID. She has had temperature around 100-101 range since onset.  She was having some shallow-type breathing yesterday, which prompted her visit to the urgent care. She has some nasal congestion and postnasal drip as well as some cough.  She denies feeling short of breath, denies wheezing, denies chest pain. Today she is tired.  Is not sure if she feels any better or not. She has taken 1 dose of Augmentin and 1 dose of azithromycin.  Appetite is down but she is eating without problem. She is very anxious because her husband is in the hospital with a significant eye infection and not getting better.  She has not been able to visit him because she has been ill. She has not been taking any lorazepam because she has been afraid to with her current illness.  Past Medical History:  Diagnosis Date   Anxiety    Arthritis    Cholelithiasis 04/2021   noted on CT abd/pelv done for R lower abd swelling and MR abd   Chronic renal insufficiency, stage III (moderate) (HCC) 10/2015   Post-transplant  baseline sCr 1.2-1.4.     CMV infection [facility]) summer 2017   Valcyte per ID/Renal transplant team   Depression    Diverticulosis    a. 05/2012 colonoscopy   Erosive lichen planus of vulva    Topical steroids (managed by Dr. Mila Palmer [facility] via [facility].   ESRD (end stage renal disease) ([place])    began dialysis 2014--followed by Dr. [name].  Received deceased donor kidney transplant 10/2015.   Fatty liver 10/2021   with hepatomegaly   FSGS (focal segmental glomerulosclerosis)    right kidney; hx of left renal cell cancer and got nephrectomy 1993.   GERD (gastroesophageal reflux disease)    Gout    Heart murmur    (Diastolic) ECHO 09/353 showed that this murmur is coming from pt's R arm AV fistula   Hiatal hernia    History of renal cell cancer 1993   Left nephrectomy   Hyperlipidemia    Hypertension    Hypothyroidism    Impingement syndrome of left shoulder 04/2016   Dr. Christy Sartorius to PT   Kidney cancer, primary, with metastasis from kidney to other site [facility])    Lumbar spinal stenosis    Chronic LBP with bilat neurogenic claudication   Metatarsal fracture, pathologic 01/2018   Right; orthocarolina-->post op shoe continued, f/u x-ray planned.   Obesity    Osteoarthritis of left knee 08/2017   severe, diffuse, tricompartmental.  Responded to steroid injection.  Osteoporosis 06/07/2016   DEXA T-score of -3.1.  Fosamax planned 2017 but dental work prevented start..  Pathological toe fx 12/2017--repeat DEXA 08/2018, T-score -3.3 radius. 12/2020 T score radius -3.7.   Pneumonia    Renal transplant recipient 11-16-2015   Baseline Cr as of 09/2020 1.2-1.4   Simple hepatic cyst    2022   Solitary kidney, acquired    SVT (supraventricular tachycardia)    UTI (urinary tract infection)    Vitamin B12 deficiency 12/2018   Starting replacement as of 12/11/2018    Past Surgical History:  Procedure Laterality Date   AV FISTULA PLACEMENT Right     Right arm: aneurismal dilatation 2017 being followed by CV surgeons   CARDIOVASCULAR STRESS TEST     03/10/15 ETT ([facility]): Exercise ECG negative at 83% max predicted HR.    CATARACT EXTRACTION     COLONOSCOPY  2003; 05/2012   diverticulosis, no polyps.  Recall 10 yrs   colonoscopy with polypectomy     Dr Henrene Pastor   DEXA  06/07/2016; 08/22/2018; 12/30/20   2017 T-score -3.1.  2020 T score -3.3.  2022 T score -3.7   HYSTERECTOMY ABDOMINAL WITH SALPINGECTOMY     KIDNEY TRANSPLANT  2015-11-16   Deceased donor kidney transplant, with Thymoglobulin induction   LIGATION OF ARTERIOVENOUS  FISTULA Right 02/14/2017   Procedure: LIGATION OF ARTERIOVENOUS  FISTULA;  Surgeon: Angelia Mould, MD;  Location: [place];  Service: Vascular;  Laterality: Right;   [place]   for malignancy- left   RENAL BIOPSY     right   RESECTION OF ARTERIOVENOUS FISTULA ANEURYSM Right 02/14/2017   Procedure: EXCISION OF RIGHT BRACHIOCEPHALIC ARTERIOVENOUS FISTULA ANEURYSM;  Surgeon: Angelia Mould, MD;  Location: [place];  Service: Vascular;  Laterality: Right;   TEE WITHOUT CARDIOVERSION N/A 08/27/2013   Procedure: TRANSESOPHAGEAL ECHOCARDIOGRAM (TEE);  Surgeon: [name], MD;  Location: [facility]COPY;  Service: Cardiovascular;  Laterality: N/A;   TOTAL ABDOMINAL HYSTERECTOMY W/ BILATERAL SALPINGOOPHORECTOMY  1997   fibroids   TRANSTHORACIC ECHOCARDIOGRAM     03/10/15 echo ([facility], [facility]): LV cavity normal in size, focal basal hypertrophy, normal systolic function, EF 89% (visual est). Normal wall motion, no regional wall motion abnormalities. Mild diastolic dysfunction with normal LA chamber size. No significant valve stenosis or regurgitation.   VULVA / PERINEUM BIOPSY  2015    Outpatient Medications Prior to Visit  Medication Sig Dispense Refill   alendronate (FOSAMAX) 70 MG tablet Take 1 tablet (70 mg total) by mouth every 7 (seven) days. Take with a full glass of water on  an empty stomach. 12 tablet 3   amoxicillin-clavulanate (AUGMENTIN) 875-125 MG tablet Take 1 tablet by mouth every 12 (twelve) hours for 7 days. 14 tablet 0   aspirin EC 81 MG EC tablet Take 1 tablet (81 mg total) by mouth daily.     AYR SALINE NASAL DROPS NA Place 2 sprays into both nostrils daily.     Camphor-Eucalyptus-Menthol (VICKS VAPORUB EX) Apply 1 application topically daily.     citalopram (CELEXA) 20 MG tablet Take 1 tablet (20 mg total) by mouth daily. 30 tablet 1   Cyanocobalamin (B-12 PO) Take by mouth daily.     doxazosin (CARDURA) 2 MG tablet TAKE 1 TABLET BY MOUTH TWICE  DAILY 90 tablet 1   fluconazole (DIFLUCAN) 100 MG tablet Take 100 mg by mouth once a week.     hydrochlorothiazide (HYDRODIURIL) 25 MG tablet Take 1  tablet (25 mg total) by mouth daily. 90 tablet 3   levothyroxine (SYNTHROID) 125 MCG tablet Take 1 tablet (125 mcg total) by mouth daily. 90 tablet 3   LORazepam (ATIVAN) 0.5 MG tablet Take 1 tablet (0.5 mg total) by mouth 2 (two) times daily as needed for anxiety. 30 tablet 1   losartan (COZAAR) 100 MG tablet Take 1 tablet (100 mg total) by mouth daily. 90 tablet 3   Magnesium Oxide 500 MG TABS Take 1 tablet by mouth daily.     mycophenolate (MYFORTIC) 360 MG TBEC EC tablet Take 360 mg by mouth 2 (two) times daily.     SODIUM BICARBONATE PO Take 650 mg by mouth 2 (two) times daily. Takes 1 in AM and 1 at bedtime     tacrolimus (PROGRAF) 1 MG capsule Take 4 mg by mouth 2 (two) times daily. Pt takes 3 in the morning and 3 at night.     triamcinolone (NASACORT) 55 MCG/ACT AERO nasal inhaler Place 2 sprays into the nose daily.     triamcinolone cream (KENALOG) 0.1 % Apply 1 application topically daily.     verapamil (CALAN-SR) 120 MG CR tablet TAKE 1 TABLET BY MOUTH EVERY IN  THE AFTERNOON. OFFICE VISIT NEEDED FOR FURTHER REFILLS 90 tablet 3   verapamil (CALAN-SR) 240 MG CR tablet Take 1 tablet (240 mg total) by mouth every morning. 90 tablet 3   azithromycin  (ZITHROMAX) 250 MG tablet Take 1 tablet (250 mg total) by mouth daily. Take first 2 tablets together, then 1 every day until finished. 6 tablet 0   Vitamin D, Ergocalciferol, (DRISDOL) 1.25 MG (50000 UNIT) CAPS capsule Take 1 capsule (50,000 Units total) by mouth every 7 (seven) days. (Patient not taking: Reported on 07/13/2022) 12 capsule 1   meclizine (ANTIVERT) 25 MG tablet Take 1 tablet (25 mg total) by mouth 3 (three) times daily as needed for dizziness or nausea. (Patient not taking: Reported on 08/03/2022) 20 tablet 0   nystatin (MYCOSTATIN/NYSTOP) powder Apply daily to breast and abdominal folds after shower (Patient not taking: Reported on 08/03/2022)     No facility-administered medications prior to visit.    Allergies  Allergen Reactions   Labetalol     Other reaction(s): Other   Cefaclor Rash   Naproxen Rash   Enalapril Maleate Cough    Vasotec   Metoprolol Tartrate Rash    On legs    Review of Systems  As per HPI  PE:    08/03/2022    3:10 PM 08/02/2022    9:12 AM 07/13/2022    9:54 AM  Vitals with BMI  Height _0   _1   Weight 245 lbs 10 oz  252 lbs  BMI 92.42  68.34  Systolic 196 222 979  Diastolic 71 75 84  Pulse 70 81 78  02 sat 94% on RA  Physical Exam  General: Alert, tired appearing but does not appear acutely ill. Affect is pleasant, thought and speech are lucid.  She is somewhat anxious. GXQ:JJHE: no injection, icteris, swelling, or exudate.  EOMI, PERRLA. Mouth: lips without lesion/swelling.  Oral mucosa pink and moist, tongue with thick white coating that scrapes off with tongue blade.  Oropharynx without erythema, exudate, or swelling.  Neck - No masses or thyromegaly or limitation in range of motion CV: RRR, no m/r/g.   LUNGS: CTA bilat, nonlabored resps, good aeration in all lung fields.   LABS:  Last CBC Lab Results  Component Value Date  WBC 8.0 06/06/2022   HGB 13.0 06/06/2022   HCT 39.8 06/06/2022   MCV 91.3 06/06/2022   MCH  31.8 09/16/2015   RDW 14.3 06/06/2022   PLT 274.0 88/71/9597   Last metabolic panel Lab Results  Component Value Date   GLUCOSE 94 11/16/2021   NA 141 06/01/2022   K 4.0 06/01/2022   CL 111 (A) 06/01/2022   CO2 24 (A) 06/01/2022   BUN 17 06/01/2022   CREATININE 1.0 06/01/2022   EGFR 52 06/01/2022   CALCIUM 9.2 06/01/2022   PHOS 2.9 01/04/2018   PROT 6.4 08/12/2021   ALBUMIN 3.7 08/12/2021   BILITOT 0.4 08/12/2021   ALKPHOS 76 08/12/2021   AST 13 08/12/2021   ALT 10 08/12/2021   Last hemoglobin A1c Lab Results  Component Value Date   HGBA1C 5.4 02/23/2012   IMPRESSION AND PLAN:  COVID-19 respiratory infection. Her chest x-ray pattern was consistent with a viral pneumonia. However, given her chronically immunosuppressed status I do think she should be treated for possible bacterial pneumonia.  She has some trouble sometimes from a GI standpoint with taking too many meds so we will try to minimize pills is much as we can.  Decided to discontinue azithromycin today and continue with Augmentin only. Mucinex DM and Tylenol as needed recommended. Encouraged her to take her lorazepam 0.5 mg twice daily as needed anxiety.  An After Visit Summary was printed and given to the patient.  FOLLOW UP: Return if symptoms worsen or fail to improve.  Signed:  Crissie Sickles, MD           08/03/2022

## 2022-08-03 NOTE — Patient Instructions (Signed)
Take the amoxicillin/clavulanic acid.  Don't take azithromycin anymore.  Take either mucinex OR mucinex DM for cough/congestion.  Take tylenol [phone] mg three times a day AS NEEDED for temp >100.

## 2022-08-09 ENCOUNTER — Other Ambulatory Visit: Payer: Self-pay | Admitting: Family Medicine

## 2022-08-12 ENCOUNTER — Other Ambulatory Visit: Payer: Self-pay

## 2022-08-12 MED ORDER — LORAZEPAM 0.5 MG PO TABS: 0.5000 mg | ORAL_TABLET | Freq: Two times a day (BID) | ORAL | 1 refills | Status: DC | PRN

## 2022-08-12 NOTE — Telephone Encounter (Signed)
Requesting: lorazepam Contract: 05/21/19 UDS: 11/16/21 Last Visit: 08/03/22 Next Visit: 09/13/22 Last Refill: 11/16/21(30,1)  Please Advise. Med pending

## 2022-08-15 ENCOUNTER — Telehealth: Payer: Self-pay | Admitting: Family Medicine

## 2022-08-15 NOTE — Telephone Encounter (Signed)
Spoke with patient she req CB in 09/2022 her husband is in hospital

## 2022-08-18 ENCOUNTER — Other Ambulatory Visit: Payer: Self-pay | Admitting: Family Medicine

## 2022-08-19 ENCOUNTER — Ambulatory Visit: Payer: Medicare Other | Admitting: Family Medicine

## 2022-08-23 ENCOUNTER — Telehealth: Payer: Self-pay | Admitting: Family Medicine

## 2022-08-23 DIAGNOSIS — D849 Immunodeficiency, unspecified: Secondary | ICD-10-CM | POA: Diagnosis not present

## 2022-08-23 DIAGNOSIS — Z940 Kidney transplant status: Secondary | ICD-10-CM | POA: Diagnosis not present

## 2022-08-23 DIAGNOSIS — N390 Urinary tract infection, site not specified: Secondary | ICD-10-CM | POA: Diagnosis not present

## 2022-08-23 NOTE — Telephone Encounter (Signed)
Left message for patient to schedule Annual Wellness Visit.  Please schedule(office,telephone/video call) with Nurse Health Advisor at [facility].  Please call [phone] ask for [facility]

## 2022-08-24 DIAGNOSIS — I10 Essential (primary) hypertension: Secondary | ICD-10-CM | POA: Diagnosis not present

## 2022-08-24 DIAGNOSIS — N2581 Secondary hyperparathyroidism of renal origin: Secondary | ICD-10-CM | POA: Diagnosis not present

## 2022-08-24 DIAGNOSIS — Z79899 Other long term (current) drug therapy: Secondary | ICD-10-CM | POA: Diagnosis not present

## 2022-08-24 DIAGNOSIS — K769 Liver disease, unspecified: Secondary | ICD-10-CM | POA: Diagnosis not present

## 2022-08-24 DIAGNOSIS — N051 Unspecified nephritic syndrome with focal and segmental glomerular lesions: Secondary | ICD-10-CM | POA: Diagnosis not present

## 2022-08-24 DIAGNOSIS — K760 Fatty (change of) liver, not elsewhere classified: Secondary | ICD-10-CM | POA: Diagnosis not present

## 2022-08-24 DIAGNOSIS — B259 Cytomegaloviral disease, unspecified: Secondary | ICD-10-CM | POA: Diagnosis not present

## 2022-08-24 DIAGNOSIS — K469 Unspecified abdominal hernia without obstruction or gangrene: Secondary | ICD-10-CM | POA: Diagnosis not present

## 2022-08-24 DIAGNOSIS — D849 Immunodeficiency, unspecified: Secondary | ICD-10-CM | POA: Diagnosis not present

## 2022-08-24 DIAGNOSIS — Z940 Kidney transplant status: Secondary | ICD-10-CM | POA: Diagnosis not present

## 2022-08-25 ENCOUNTER — Other Ambulatory Visit: Payer: Self-pay | Admitting: Family Medicine

## 2022-08-25 ENCOUNTER — Other Ambulatory Visit: Payer: Self-pay

## 2022-08-25 MED ORDER — LEVOTHYROXINE SODIUM 125 MCG PO TABS: 125.0000 ug | ORAL_TABLET | Freq: Every day | ORAL | 3 refills | Status: DC

## 2022-08-25 MED ORDER — LOSARTAN POTASSIUM 100 MG PO TABS: 100.0000 mg | ORAL_TABLET | Freq: Every day | ORAL | 3 refills | Status: DC

## 2022-08-25 MED ORDER — SODIUM BICARBONATE 650 MG PO TABS: 650.0000 mg | ORAL_TABLET | Freq: Two times a day (BID) | ORAL | 3 refills | Status: DC

## 2022-08-25 NOTE — Telephone Encounter (Signed)
Please fill, if appropriate.

## 2022-08-30 ENCOUNTER — Other Ambulatory Visit: Payer: Self-pay

## 2022-08-30 MED ORDER — VERAPAMIL HCL ER 240 MG PO TBCR: 240.0000 mg | EXTENDED_RELEASE_TABLET | Freq: Every morning | ORAL | 3 refills | Status: DC

## 2022-08-30 MED ORDER — ALENDRONATE SODIUM 70 MG PO TABS: 70.0000 mg | ORAL_TABLET | ORAL | 3 refills | Status: DC

## 2022-08-30 MED ORDER — VERAPAMIL HCL ER 120 MG PO TBCR: EXTENDED_RELEASE_TABLET | ORAL | 0 refills | Status: DC

## 2022-08-30 MED ORDER — HYDROCHLOROTHIAZIDE 25 MG PO TABS: 25.0000 mg | ORAL_TABLET | Freq: Every day | ORAL | 3 refills | Status: DC

## 2022-09-02 ENCOUNTER — Encounter: Payer: Medicare Other | Admitting: Internal Medicine

## 2022-09-09 NOTE — Patient Instructions (Signed)

## 2022-09-13 ENCOUNTER — Encounter: Payer: Self-pay | Admitting: Family Medicine

## 2022-09-13 ENCOUNTER — Ambulatory Visit (INDEPENDENT_AMBULATORY_CARE_PROVIDER_SITE_OTHER): Payer: Medicare Other | Admitting: Family Medicine

## 2022-09-13 VITALS — BP 124/77 | HR 72 | Temp 98.0°F | Ht 65.0 in | Wt 238.0 lb

## 2022-09-13 DIAGNOSIS — Z23 Encounter for immunization: Secondary | ICD-10-CM

## 2022-09-13 DIAGNOSIS — F411 Generalized anxiety disorder: Secondary | ICD-10-CM | POA: Diagnosis not present

## 2022-09-13 DIAGNOSIS — E538 Deficiency of other specified B group vitamins: Secondary | ICD-10-CM | POA: Diagnosis not present

## 2022-09-13 DIAGNOSIS — I10 Essential (primary) hypertension: Secondary | ICD-10-CM

## 2022-09-13 DIAGNOSIS — Z0000 Encounter for general adult medical examination without abnormal findings: Secondary | ICD-10-CM | POA: Diagnosis not present

## 2022-09-13 DIAGNOSIS — N1831 Chronic kidney disease, stage 3a: Secondary | ICD-10-CM | POA: Diagnosis not present

## 2022-09-13 DIAGNOSIS — E039 Hypothyroidism, unspecified: Secondary | ICD-10-CM

## 2022-09-13 DIAGNOSIS — Z79899 Other long term (current) drug therapy: Secondary | ICD-10-CM | POA: Diagnosis not present

## 2022-09-13 DIAGNOSIS — E559 Vitamin D deficiency, unspecified: Secondary | ICD-10-CM

## 2022-09-13 LAB — LIPID PANEL
Cholesterol: 148 mg/dL (ref 0–200)
HDL: 41.3 mg/dL (ref >39.00)
LDL Cholesterol: 79 mg/dL (ref 0–99)
NonHDL: 107.09
Total CHOL/HDL Ratio: 4
Triglycerides: 140 mg/dL (ref 0.0–149.0)
VLDL: 28 mg/dL (ref 0.0–40.0)

## 2022-09-13 LAB — VITAMIN D 25 HYDROXY (VIT D DEFICIENCY, FRACTURES): VITD: 14.49 ng/mL — ABNORMAL LOW (ref 30.00–100.00)

## 2022-09-13 LAB — CBC
HCT: 39.7 % (ref 36.0–46.0)
Hemoglobin: 13.3 g/dL (ref 12.0–15.0)
MCHC: 33.6 g/dL (ref 30.0–36.0)
MCV: 89.6 fl (ref 78.0–100.0)
Platelets: 221 10*3/uL (ref 150.0–400.0)
RBC: 4.43 Mil/uL (ref 3.87–5.11)
RDW: 14.4 % (ref 11.5–15.5)
WBC: 6.5 10*3/uL (ref 4.0–10.5)

## 2022-09-13 LAB — COMPREHENSIVE METABOLIC PANEL
ALT: 15 U/L (ref 0–35)
AST: 18 U/L (ref 0–37)
Albumin: 4.1 g/dL (ref 3.5–5.2)
Alkaline Phosphatase: 69 U/L (ref 39–117)
BUN: 15 mg/dL (ref 6–23)
CO2: 25 mEq/L (ref 19–32)
Calcium: 9.6 mg/dL (ref 8.4–10.5)
Chloride: 104 mEq/L (ref 96–112)
Creatinine, Ser: 1.01 mg/dL (ref 0.40–1.20)
GFR: 53.87 mL/min — ABNORMAL LOW (ref >60.00)
Glucose, Bld: 110 mg/dL — ABNORMAL HIGH (ref 70–99)
Potassium: 3.8 mEq/L (ref 3.5–5.1)
Sodium: 140 mEq/L (ref 135–145)
Total Bilirubin: 0.7 mg/dL (ref 0.2–1.2)
Total Protein: 6.6 g/dL (ref 6.0–8.3)

## 2022-09-13 LAB — VITAMIN B12: Vitamin B-12: 183 pg/mL — ABNORMAL LOW (ref 211–911)

## 2022-09-13 LAB — TSH: TSH: 6.51 u[IU]/mL — ABNORMAL HIGH (ref 0.35–5.50)

## 2022-09-13 MED ORDER — SHINGRIX 50 MCG/0.5ML IM SUSR: 0.5000 mL | Freq: Once | INTRAMUSCULAR | 1 refills | Status: AC

## 2022-09-13 NOTE — Progress Notes (Signed)
Office Note 09/13/2022  CC:  Chief Complaint  Patient presents with   Annual Exam    Pt is fasting    HPI:  Patient is a 77 y.o. female who is here for annual health maintenance exam and follow-up chronic renal insufficiency and hypertension.  Tabitha Green feels pretty well other than not much motivation lately, feels tired a lot.  Appetite is good.  No diarrhea or vomiting.  Bothered somewhat by chronic L shoulder pain, primarily trapezius regions L>R, worse with ROM. No preceding injury or overuse/strain.  Tylenol helps.  PMP AWARE reviewed today: most recent rx for lorazepam was filled 08/12/2022, # 30, rx by me. No red flags.  Past Medical History:  Diagnosis Date   Anxiety    Arthritis    Cholelithiasis 04/2021   noted on CT abd/pelv done for R lower abd swelling and MR abd   Chronic renal insufficiency, stage III (moderate) (HCC) 10/2015   Post-transplant baseline sCr 1.2-1.4.     CMV infection [facility]) summer 2017   Valcyte per ID/Renal transplant team   Depression    Diverticulosis    a. 05/2012 colonoscopy   Erosive lichen planus of vulva    Topical steroids (managed by Dr. Mila Palmer [facility] via [facility] out[facility].   ESRD (end stage renal disease) ([place])    began dialysis 2014--followed by Dr. [name].  Received deceased donor kidney transplant 10/2015.   Fatty liver 10/2021   with hepatomegaly   FSGS (focal segmental glomerulosclerosis)    right kidney; hx of left renal cell cancer and got nephrectomy 1993.   GERD (gastroesophageal reflux disease)    Gout    Heart murmur    (Diastolic) ECHO 11/2704 showed that this murmur is coming from pt's R arm AV fistula   Hiatal hernia    History of renal cell cancer 1993   Left nephrectomy   Hyperlipidemia    Hypertension    Hypothyroidism    Impingement syndrome of left shoulder 04/2016   Dr. Christy Sartorius to PT   Kidney cancer, primary, with metastasis from kidney to other site [facility])    Lumbar  spinal stenosis    Chronic LBP with bilat neurogenic claudication   Metatarsal fracture, pathologic 01/2018   Right; orthocarolina-->post op shoe continued, f/u x-ray planned.   Obesity    Osteoarthritis of left knee 08/2017   severe, diffuse, tricompartmental.  Responded to steroid injection.     Osteoporosis 06/07/2016   DEXA T-score of -3.1.  Fosamax planned 2017 but dental work prevented start..  Pathological toe fx 12/2017--repeat DEXA 08/2018, T-score -3.3 radius. 12/2020 T score radius -3.7.   Pneumonia    Renal transplant recipient 11/06/2015   Baseline Cr as of 09/2020 1.2-1.4   Simple hepatic cyst    2022   Solitary kidney, acquired    SVT (supraventricular tachycardia)    UTI (urinary tract infection)    Vitamin B12 deficiency 12/2018   Starting replacement as of 12/11/2018    Past Surgical History:  Procedure Laterality Date   AV FISTULA PLACEMENT Right    Right arm: aneurismal dilatation 2017 being followed by CV surgeons   CARDIOVASCULAR STRESS TEST     03/10/15 ETT ([facility]): Exercise ECG negative at 83% max predicted HR.    CATARACT EXTRACTION     COLONOSCOPY  2003; 05/2012   diverticulosis, no polyps.  Recall 10 yrs   colonoscopy with polypectomy     Dr Henrene Pastor   DEXA  06/07/2016; 08/22/2018; 12/30/20  2017 T-score -3.1.  2020 T score -3.3.  2022 T score -3.7   HYSTERECTOMY ABDOMINAL WITH SALPINGECTOMY     KIDNEY TRANSPLANT  2015/11/26   Deceased donor kidney transplant, with Thymoglobulin induction   LIGATION OF ARTERIOVENOUS  FISTULA Right 02/14/2017   Procedure: LIGATION OF ARTERIOVENOUS  FISTULA;  Surgeon: Angelia Mould, MD;  Location: [place];  Service: Vascular;  Laterality: Right;   [place]   for malignancy- left   RENAL BIOPSY     right   RESECTION OF ARTERIOVENOUS FISTULA ANEURYSM Right 02/14/2017   Procedure: EXCISION OF RIGHT BRACHIOCEPHALIC ARTERIOVENOUS FISTULA ANEURYSM;  Surgeon: Angelia Mould, MD;  Location: [place];   Service: Vascular;  Laterality: Right;   TEE WITHOUT CARDIOVERSION N/A 08/27/2013   Procedure: TRANSESOPHAGEAL ECHOCARDIOGRAM (TEE);  Surgeon: [name], MD;  Location[facility] ENDOSCOPY;  Service: Cardiovascular;  Laterality: N/A;   TOTAL ABDOMINAL HYSTERECTOMY W/ BILATERAL SALPINGOOPHORECTOMY  1997   fibroids   TRANSTHORACIC ECHOCARDIOGRAM     03/10/15 echo ([facility], [facility]): LV cavity normal in size, focal basal hypertrophy, normal systolic function, EF 06% (visual est). Normal wall motion, no regional wall motion abnormalities. Mild diastolic dysfunction with normal LA chamber size. No significant valve stenosis or regurgitation.   VULVA / PERINEUM BIOPSY  2015    Family History  Problem Relation Age of Onset   Deep vein thrombosis Mother        post thyroid surgery   Hypertension Mother    Heart attack Father 48       deceased   Hypertension Father    Cancer Sister        breast   Cancer Paternal Aunt        pancreatic   Diabetes Maternal Grandfather    Stroke Paternal Grandmother        in 64s   Fibroids Daughter    Colon cancer Neg Hx    Esophageal cancer Neg Hx    Stomach cancer Neg Hx    Rectal cancer Neg Hx    Sleep apnea Neg Hx     Social History   Socioeconomic History   Marital status: Married    Spouse name: Not on file   Number of children: 2   Years of education: Not on file   Highest education level: 12th grade  Occupational History   Occupation: Retired  Tobacco Use   Smoking status: Never   Smokeless tobacco: Never  Vaping Use   Vaping Use: Never used  Substance and Sexual Activity   Alcohol use: Yes    Comment: rarely   Drug use: No   Sexual activity: Not on file  Other Topics Concern   Not on file  Social History Narrative   Lives in [place] with husband.  Retired.  Previously worked in [employer] @ [employer]   No T/A/Ds.   Social Determinants of Health   Financial Resource Strain: Low Risk  (09/13/2022)   Overall  Financial Resource Strain (CARDIA)    Difficulty of Paying Living Expenses: Not hard at all  Food Insecurity: No Food Insecurity (09/13/2022)   Hunger Vital Sign    Worried About Running Out of Food in the Last Year: Never true    Ran Out of Food in the Last Year: Never true  Transportation Needs: No Transportation Needs (09/13/2022)   PRAPARE - Hydrologist (Medical): No    Lack of Transportation (Non-Medical): No  Physical Activity:  Insufficiently Active (09/13/2022)   Exercise Vital Sign    Days of Exercise per Week: 2 days    Minutes of Exercise per Session: 20 min  Stress: Stress Concern Present (09/13/2022)   [place]    Feeling of Stress : To some extent  Social Connections: Moderately Integrated (09/13/2022)   Social Connection and Isolation Panel [NHANES]    Frequency of Communication with Friends and Family: More than three times a week    Frequency of Social Gatherings with Friends and Family: More than three times a week    Attends Religious Services: 1 to 4 times per year    Active Member of [employer] or Organizations: No    Attends Archivist Meetings: Never    Marital Status: Married  Human resources officer Violence: Not At Risk (09/13/2022)   Humiliation, Afraid, Rape, and Kick questionnaire    Fear of Current or Ex-Partner: No    Emotionally Abused: No    Physically Abused: No    Sexually Abused: No    Outpatient Medications Prior to Visit  Medication Sig Dispense Refill   alendronate (FOSAMAX) 70 MG tablet Take 1 tablet (70 mg total) by mouth every 7 (seven) days. Take with a full glass of water on an empty stomach. 12 tablet 3   aspirin EC 81 MG EC tablet Take 1 tablet (81 mg total) by mouth daily.     AYR SALINE NASAL DROPS NA Place 2 sprays into both nostrils daily.     Camphor-Eucalyptus-Menthol (VICKS VAPORUB EX) Apply 1 application topically daily.     citalopram (CELEXA)  20 MG tablet Take 1 tablet (20 mg total) by mouth daily. 30 tablet 1   doxazosin (CARDURA) 2 MG tablet TAKE 1 TABLET BY MOUTH TWICE  DAILY 90 tablet 1   hydrochlorothiazide (HYDRODIURIL) 25 MG tablet Take 1 tablet (25 mg total) by mouth daily. 90 tablet 3   levothyroxine (SYNTHROID) 125 MCG tablet Take 1 tablet (125 mcg total) by mouth daily. 90 tablet 3   LORazepam (ATIVAN) 0.5 MG tablet Take 1 tablet (0.5 mg total) by mouth 2 (two) times daily as needed for anxiety. 30 tablet 1   losartan (COZAAR) 100 MG tablet Take 1 tablet (100 mg total) by mouth daily. 90 tablet 3   mycophenolate (MYFORTIC) 360 MG TBEC EC tablet Take 360 mg by mouth 2 (two) times daily.     sodium bicarbonate 650 MG tablet Take 1 tablet (650 mg total) by mouth 2 (two) times daily. Takes 1 in AM and 1 at bedtime 180 tablet 3   tacrolimus (PROGRAF) 1 MG capsule Take 4 mg by mouth 2 (two) times daily. Pt takes 3 in the morning and 3 at night.     triamcinolone (NASACORT) 55 MCG/ACT AERO nasal inhaler Place 2 sprays into the nose daily.     triamcinolone cream (KENALOG) 0.1 % Apply 1 application topically daily.     verapamil (CALAN-SR) 120 MG CR tablet TAKE 1 TABLET BY MOUTH EVERY  AFTERNOON 90 tablet 0   verapamil (CALAN-SR) 240 MG CR tablet Take 1 tablet (240 mg total) by mouth every morning. 90 tablet 3   Cyanocobalamin (B-12 PO) Take by mouth daily. (Patient not taking: Reported on 09/13/2022)     fluconazole (DIFLUCAN) 100 MG tablet Take 100 mg by mouth once a week. (Patient not taking: Reported on 09/13/2022)     Magnesium Oxide 500 MG TABS Take 1 tablet by mouth daily. (  Patient not taking: Reported on 09/13/2022)     Vitamin D, Ergocalciferol, (DRISDOL) 1.25 MG (50000 UNIT) CAPS capsule Take 1 capsule (50,000 Units total) by mouth every 7 (seven) days. (Patient not taking: Reported on 09/13/2022) 12 capsule 1   No facility-administered medications prior to visit.    Allergies  Allergen Reactions   Labetalol     Other  reaction(s): Other   Cefaclor Rash   Naproxen Rash   Enalapril Maleate Cough    Vasotec   Metoprolol Tartrate Rash    On legs    Review of Systems  Constitutional:  Positive for fatigue. Negative for appetite change, chills and fever.  HENT:  Negative for congestion, dental problem, ear pain and sore throat.   Eyes:  Negative for discharge, redness and visual disturbance.  Respiratory:  Negative for cough, chest tightness, shortness of breath and wheezing.   Cardiovascular:  Negative for chest pain, palpitations and leg swelling.  Gastrointestinal:  Negative for abdominal pain, blood in stool, diarrhea, nausea and vomiting.  Genitourinary:  Negative for difficulty urinating, dysuria, flank pain, frequency, hematuria and urgency.  Musculoskeletal:  Positive for myalgias (L trap region). Negative for arthralgias, back pain, joint swelling and neck stiffness.  Skin:  Negative for pallor and rash.  Neurological:  Negative for dizziness, speech difficulty, weakness and headaches.  Hematological:  Negative for adenopathy. Does not bruise/bleed easily.  Psychiatric/Behavioral:  Negative for confusion and sleep disturbance. The patient is not nervous/anxious.     PE;    09/13/2022    8:52 AM 08/03/2022    3:10 PM 08/02/2022    9:12 AM  Vitals with BMI  Height '5\' 5"'$  '5\' 5"'$    Weight 238 lbs 245 lbs 10 oz   BMI 41.93 79.02   Systolic 409 735 329  Diastolic 77 71 75  Pulse 72 70 81     Exam chaperoned by Kavin Leech, CMA. Gen: Alert, well appearing.  Patient is oriented to person, place, time, and situation. AFFECT: pleasant, lucid thought and speech. ENT: Ears: EACs clear, normal epithelium.  TMs with good light reflex and landmarks bilaterally.  Eyes: no injection, icteris, swelling, or exudate.  EOMI, PERRLA. Nose: no drainage or turbinate edema/swelling.  No injection or focal lesion.  Mouth: lips without lesion/swelling.  Oral mucosa pink and moist.  Dentition intact and without  obvious caries or gingival swelling.  Oropharynx without erythema, exudate, or swelling.  Neck: supple/nontender.  No LAD, mass, or TM.  Carotid pulses 2+ bilaterally, without bruits. CV: RRR, no m/r/g.   LUNGS: CTA bilat, nonlabored resps, good aeration in all lung fields. ABD: soft, large right lower quadrant abdominal wall hernia, NT, ND, BS normal.  No hepatospenomegaly or mass.  No bruits. EXT: no clubbing, cyanosis, or edema.  Musculoskeletal: no joint swelling, erythema, warmth, or tenderness.  ROM of all joints intact. This includes L shoulder.  Skin - no sores or suspicious lesions or rashes or color changes  Pertinent labs:  Lab Results  Component Value Date   TSH 2.30 08/12/2021   Lab Results  Component Value Date   WBC 8.0 06/06/2022   HGB 13.0 06/06/2022   HCT 39.8 06/06/2022   MCV 91.3 06/06/2022   PLT 274.0 06/06/2022   Lab Results  Component Value Date   CREATININE 1.0 06/01/2022   BUN 17 06/01/2022   NA 141 06/01/2022   K 4.0 06/01/2022   CL 111 (A) 06/01/2022   CO2 24 (A) 06/01/2022   Lab Results  Component Value Date   ALT 10 08/12/2021   AST 13 08/12/2021   ALKPHOS 76 08/12/2021   BILITOT 0.4 08/12/2021   Lab Results  Component Value Date   CHOL 144 11/16/2021   Lab Results  Component Value Date   HDL 40.30 11/16/2021   Lab Results  Component Value Date   LDLCALC 79 11/16/2021   Lab Results  Component Value Date   TRIG 126.0 11/16/2021   Lab Results  Component Value Date   CHOLHDL 4 11/16/2021   Lab Results  Component Value Date   HGBA1C 5.4 02/23/2012   ASSESSMENT AND PLAN:   #1 health maintenance exam: Reviewed age and gender appropriate health maintenance issues (prudent diet, regular exercise, health risks of tobacco and excessive alcohol, use of seatbelts, fire alarms in home, use of sunscreen).  Also reviewed age and gender appropriate health screening as well as vaccine recommendations. Vaccines: shingrix->rx given today.   Labs: cbc,cmet, flp, tsh, vit D. Cervical ca screening: No further screening indicated Breast ca screening: Next screening mammogram due July this year. Colon ca screening: Last colonoscopy was 2013.  As per her GI visit on 07/13/2022 with Dr. Henrene Pastor, the plan is for colonoscopy.  She had to cancel d/t husband's recent illness. Osteoporosis: rx'd alendronate in the past and has not started it yet, still HESITANT.  #2 hypertension, well-controlled on losartan 100 mg a day, HCTZ 25 mg a day, and doxazosin 2 mg twice daily. Electrolytes and creatinine today.  3.  Chronic renal insufficiency stage III, history of renal transplant. Avoids NSAIDs. Electrolytes and creatinine today. Followed by nephrology--continue tacrolimus and mycophenolate.  #4 chronic left shoulder soft tissue pain, trapezius/supraspinatus fossa soft tissue. Reassured, no signs of rotator cuff, biceps tendon, or shoulder joint pathology. Okay to take Tylenol every 8 hours as needed  #5 GAD.  Fairly well-controlled, mostly worried about her husband's health lately. Continue citalopram 20 mg a day and lorazepam 0.5 twice daily as needed.  #6 vitamin B12 deficiency. Vitamin B12 level today.  7.  D deficiency--check level today.  An After Visit Summary was printed and given to the patient.  FOLLOW UP:  Return in about 4 months (around 01/12/2023) for routine chronic illness f/u. Next cpe 09/2023  Signed:  Crissie Sickles, MD           09/13/2022

## 2022-09-14 ENCOUNTER — Ambulatory Visit (INDEPENDENT_AMBULATORY_CARE_PROVIDER_SITE_OTHER): Payer: Medicare Other

## 2022-09-14 DIAGNOSIS — Z0000 Encounter for general adult medical examination without abnormal findings: Secondary | ICD-10-CM | POA: Diagnosis not present

## 2022-09-14 NOTE — Progress Notes (Signed)
Subjective:   Tabitha Green is a 77 y.o. female who presents for Medicare Annual (Subsequent) preventive examination. I connected with  Tabitha Green on 09/14/22 by a audio enabled telemedicine application and verified that I am speaking with the correct person using two identifiers.  Patient Location: Home  Provider Location: Office/Clinic  I discussed the limitations of evaluation and management by telemedicine. The patient expressed understanding and agreed to proceed.   Review of Systems    Defer to PCP       Objective:    Today's Vitals   09/14/22 1045  PainSc: 1    There is no height or weight on file to calculate BMI.     09/14/2022   10:48 AM 07/28/2021    8:53 AM 07/22/2020    3:49 PM 06/17/2019    8:20 AM 02/12/2019   10:05 AM 06/11/2018    8:20 AM 06/06/2017    9:24 AM  Advanced Directives  Does Patient Have a Medical Advance Directive? Yes Yes Yes Yes Yes Yes No  Type of Corporate treasurer of [place];Living will Living will;Healthcare Power of [place];Living will Living will;Healthcare Power of Attorney   Does patient want to make changes to medical advance directive? No - Patient declined        Copy of [place] in Chart?  No - copy requested No - copy requested No - copy requested No - copy requested No - copy requested   Would patient like information on creating a medical advance directive?       No - Patient declined    Current Medications (verified) Outpatient Encounter Medications as of 09/14/2022  Medication Sig   alendronate (FOSAMAX) 70 MG tablet Take 1 tablet (70 mg total) by mouth every 7 (seven) days. Take with a full glass of water on an empty stomach.   aspirin EC 81 MG EC tablet Take 1 tablet (81 mg total) by mouth daily.   AYR SALINE NASAL DROPS NA Place 2 sprays into both nostrils daily.   Camphor-Eucalyptus-Menthol (VICKS VAPORUB EX) Apply 1  application topically daily.   citalopram (CELEXA) 20 MG tablet Take 1 tablet (20 mg total) by mouth daily.   Cyanocobalamin (B-12 PO) Take by mouth daily. (Patient not taking: Reported on 09/13/2022)   doxazosin (CARDURA) 2 MG tablet TAKE 1 TABLET BY MOUTH TWICE  DAILY   hydrochlorothiazide (HYDRODIURIL) 25 MG tablet Take 1 tablet (25 mg total) by mouth daily.   levothyroxine (SYNTHROID) 125 MCG tablet Take 1 tablet (125 mcg total) by mouth daily.   LORazepam (ATIVAN) 0.5 MG tablet Take 1 tablet (0.5 mg total) by mouth 2 (two) times daily as needed for anxiety.   losartan (COZAAR) 100 MG tablet Take 1 tablet (100 mg total) by mouth daily.   mycophenolate (MYFORTIC) 360 MG TBEC EC tablet Take 360 mg by mouth 2 (two) times daily.   sodium bicarbonate 650 MG tablet Take 1 tablet (650 mg total) by mouth 2 (two) times daily. Takes 1 in AM and 1 at bedtime   tacrolimus (PROGRAF) 1 MG capsule Take 4 mg by mouth 2 (two) times daily. Pt takes 3 in the morning and 3 at night.   triamcinolone (NASACORT) 55 MCG/ACT AERO nasal inhaler Place 2 sprays into the nose daily.   triamcinolone cream (KENALOG) 0.1 % Apply 1 application topically daily.   verapamil (CALAN-SR) 120 MG CR tablet TAKE 1 TABLET  BY MOUTH EVERY  AFTERNOON   verapamil (CALAN-SR) 240 MG CR tablet Take 1 tablet (240 mg total) by mouth every morning.   Vitamin D, Ergocalciferol, (DRISDOL) 1.25 MG (50000 UNIT) CAPS capsule Take 1 capsule (50,000 Units total) by mouth every 7 (seven) days. (Patient not taking: Reported on 09/13/2022)   No facility-administered encounter medications on file as of 09/14/2022.    Allergies (verified) Labetalol, Cefaclor, Naproxen, Enalapril maleate, and Metoprolol tartrate   History: Past Medical History:  Diagnosis Date   Anxiety    Arthritis    Cholelithiasis 04/2021   noted on CT abd/pelv done for R lower abd swelling and MR abd   Chronic renal insufficiency, stage III (moderate) ([place]) 10/2015    Post-transplant baseline sCr 1.2-1.4.     CMV infection [facility]) summer 2017   Valcyte per ID/Renal transplant team   Depression    Diverticulosis    a. 05/2012 colonoscopy   Erosive lichen planus of vulva    Topical steroids (managed by Dr. Mila Palmer [facility] via [facility] out[facility] services.   ESRD (end stage renal disease) ([place])    began dialysis 2014--followed by Dr. [name].  Received deceased donor kidney transplant 10/2015.   Fatty liver 10/2021   with hepatomegaly   FSGS (focal segmental glomerulosclerosis)    right kidney; hx of left renal cell cancer and got nephrectomy 1993.   GERD (gastroesophageal reflux disease)    Gout    Heart murmur    (Diastolic) ECHO 02/7823 showed that this murmur is coming from pt's R arm AV fistula   Hiatal hernia    History of renal cell cancer 1993   Left nephrectomy   Hyperlipidemia    Hypertension    Hypothyroidism    Impingement syndrome of left shoulder 04/2016   Dr. Christy Sartorius to PT   Kidney cancer, primary, with metastasis from kidney to other site [facility])    Lumbar spinal stenosis    Chronic LBP with bilat neurogenic claudication   Metatarsal fracture, pathologic 01/2018   Right; orthocarolina-->post op shoe continued, f/u x-ray planned.   Obesity    Osteoarthritis of left knee 08/2017   severe, diffuse, tricompartmental.  Responded to steroid injection.     Osteoporosis 06/07/2016   DEXA T-score of -3.1.  Fosamax planned 2017 but dental work prevented start..  Pathological toe fx 12/2017--repeat DEXA 08/2018, T-score -3.3 radius. 12/2020 T score radius -3.7.   Pneumonia    Renal transplant recipient 2015/11/11   Baseline Cr as of 09/2020 1.2-1.4   Simple hepatic cyst    2022   Solitary kidney, acquired    SVT (supraventricular tachycardia)    UTI (urinary tract infection)    Vitamin B12 deficiency 12/2018   Starting replacement as of 12/11/2018   Past Surgical History:  Procedure Laterality Date   AV FISTULA  PLACEMENT Right    Right arm: aneurismal dilatation 2017 being followed by CV surgeons   CARDIOVASCULAR STRESS TEST     03/10/15 ETT ([facility]): Exercise ECG negative at 83% max predicted HR.    CATARACT EXTRACTION     COLONOSCOPY  2003; 05/2012   diverticulosis, no polyps.  Recall 10 yrs   colonoscopy with polypectomy     Dr Henrene Pastor   DEXA  06/07/2016; 08/22/2018; 12/30/20   2017 T-score -3.1.  2020 T score -3.3.  2022 T score -3.7   HYSTERECTOMY ABDOMINAL WITH SALPINGECTOMY     KIDNEY TRANSPLANT  November 11, 2015   Deceased donor kidney transplant, with Thymoglobulin  induction   LIGATION OF ARTERIOVENOUS  FISTULA Right 02/14/2017   Procedure: LIGATION OF ARTERIOVENOUS  FISTULA;  Surgeon: Angelia Mould, MD;  Location: [place];  Service: Vascular;  Laterality: Right;   [place]   for malignancy- left   RENAL BIOPSY     right   RESECTION OF ARTERIOVENOUS FISTULA ANEURYSM Right 02/14/2017   Procedure: EXCISION OF RIGHT BRACHIOCEPHALIC ARTERIOVENOUS FISTULA ANEURYSM;  Surgeon: Angelia Mould, MD;  Location: [place];  Service: Vascular;  Laterality: Right;   TEE WITHOUT CARDIOVERSION N/A 08/27/2013   Procedure: TRANSESOPHAGEAL ECHOCARDIOGRAM (TEE);  Surgeon: [name], MD;  Location: [facility] ENDOSCOPY;  Service: Cardiovascular;  Laterality: N/A;   TOTAL ABDOMINAL HYSTERECTOMY W/ BILATERAL SALPINGOOPHORECTOMY  1997   fibroids   TRANSTHORACIC ECHOCARDIOGRAM     03/10/15 echo ([facility], [facility]): LV cavity normal in size, focal basal hypertrophy, normal systolic function, EF 59% (visual est). Normal wall motion, no regional wall motion abnormalities. Mild diastolic dysfunction with normal LA chamber size. No significant valve stenosis or regurgitation.   VULVA / PERINEUM BIOPSY  2015   Family History  Problem Relation Age of Onset   Deep vein thrombosis Mother        post thyroid surgery   Hypertension Mother    Heart attack Father 78       deceased    Hypertension Father    Cancer Sister        breast   Cancer Paternal Aunt        pancreatic   Diabetes Maternal Grandfather    Stroke Paternal Grandmother        in 63s   Fibroids Daughter    Colon cancer Neg Hx    Esophageal cancer Neg Hx    Stomach cancer Neg Hx    Rectal cancer Neg Hx    Sleep apnea Neg Hx    Social History   Socioeconomic History   Marital status: Married    Spouse name: Not on file   Number of children: 2   Years of education: Not on file   Highest education level: 12th grade  Occupational History   Occupation: Retired  Tobacco Use   Smoking status: Never   Smokeless tobacco: Never  Vaping Use   Vaping Use: Never used  Substance and Sexual Activity   Alcohol use: Yes    Comment: rarely   Drug use: No   Sexual activity: Not on file  Other Topics Concern   Not on file  Social History Narrative   Lives in [place] with husband.  Retired.  Previously worked in [employer] @ [employer]   No T/A/Ds.   Social Determinants of Health   Financial Resource Strain: Low Risk  (09/14/2022)   Overall Financial Resource Strain (CARDIA)    Difficulty of Paying Living Expenses: Not hard at all  Food Insecurity: No Food Insecurity (09/14/2022)   Hunger Vital Sign    Worried About Running Out of Food in the Last Year: Never true    Ran Out of Food in the Last Year: Never true  Transportation Needs: No Transportation Needs (09/14/2022)   PRAPARE - Hydrologist (Medical): No    Lack of Transportation (Non-Medical): No  Physical Activity: Insufficiently Active (09/14/2022)   Exercise Vital Sign    Days of Exercise per Week: 2 days    Minutes of Exercise per Session: 20 min  Stress: Stress Concern Present (09/14/2022)   [employer]  of Occupational Health - Occupational Stress Questionnaire    Feeling of Stress : To some extent  Social Connections: Moderately Integrated (09/14/2022)   Social Connection and Isolation Panel [NHANES]     Frequency of Communication with Friends and Family: More than three times a week    Frequency of Social Gatherings with Friends and Family: More than three times a week    Attends Religious Services: 1 to 4 times per year    Active Member of [employer] or Organizations: No    Attends Music therapist: Never    Marital Status: Married    Tobacco Counseling Counseling given: Not Answered   Clinical Intake:  Pre-visit preparation completed: Yes  Pain : 0-10 Pain Score: 1         How often do you need to have someone help you when you read instructions, pamphlets, or other written materials from your doctor or pharmacy?: 1 - Never  Diabetic?No  Interpreter Needed?: No  Information entered by :: Toria C., RMA   Activities of Daily Living    09/14/2022   10:47 AM  In your present state of health, do you have any difficulty performing the following activities:  Hearing? 0  Vision? 0  Difficulty concentrating or making decisions? 0  Walking or climbing stairs? 1  Dressing or bathing? 0  Doing errands, shopping? 0  Preparing Food and eating ? N  Using the Toilet? N  In the past six months, have you accidently leaked urine? Y  Do you have problems with loss of bowel control? N  Managing your Medications? N  Managing your Finances? N  Housekeeping or managing your Housekeeping? N    Patient Care Team: Tammi Sou, MD as PCP - General (Family Medicine) Irene Shipper, MD as Consulting Physician (Gastroenterology) Dene Gentry, MD as Consulting Physician (Sports Medicine) Elam Dutch, MD (Inactive) as Consulting Physician (Vascular Surgery) Angelia Mould, MD as Consulting Physician (Vascular Surgery) Florinda Marker as Physician Assistant (Orthopedic Surgery) [facility]ardo[facility], Mila Palmer, MD as Consulting Physician (Dermatology) Santiago Glad, PA-C as Consulting Physician (Orthopedic Surgery) Evans Lance, MD as Consulting  Physician (Cardiology) Justin Mend, MD as Consulting Physician (Nephrology) Silverio Decamp, MD as Consulting Physician (Family Medicine) Leonia Corona, MD as Referring Physician (Ophthalmology) Cheron Every, MD as Consulting Physician (Transplant)  Indicate any recent Medical Services you may have received from other than [facility] providers in the past year (date may be approximate).     Assessment:   This is a routine wellness examination for Shalisha.  Hearing/Vision screen No results found.  Dietary issues and exercise activities discussed: Current Exercise Habits: Home exercise routine, Time (Minutes): 20, Frequency (Times/Week): 2, Weekly Exercise (Minutes/Week): 40, Exercise limited by: None identified   Goals Addressed   None   Depression Screen    09/14/2022   10:45 AM 09/13/2022    8:58 AM 06/06/2022   10:22 AM 11/16/2021   10:03 AM 07/28/2021    8:52 AM 06/02/2021   10:01 AM 07/22/2020    3:51 PM  PHQ 2/9 Scores  PHQ - 2 Score 2 2 0 0 0 0 0  PHQ- 9 Score  5         Fall Risk    09/14/2022   10:44 AM 06/06/2022   10:22 AM 07/28/2021    8:54 AM 06/02/2021   10:01 AM 07/22/2020    3:51 PM  Fall Risk   Falls in the past year? 0  0 0 0 0  Number falls in past yr: 0 0 0 0 0  Injury with Fall? 0 0 0 0 0  Risk for fall due to :   Impaired balance/gait;Impaired vision    Follow up Falls evaluation completed  Falls prevention discussed Falls evaluation completed Falls prevention discussed    FALL RISK PREVENTION PERTAINING TO THE HOME:  Any stairs in or around the home? Yes  If so, are there any without handrails? Yes  Home free of loose throw rugs in walkways, pet beds, electrical cords, etc? Yes  Adequate lighting in your home to reduce risk of falls? Yes   ASSISTIVE DEVICES UTILIZED TO PREVENT FALLS:  Life alert? No  Use of a cane, walker or w/c? No  Grab bars in the bathroom? Yes  Shower chair or bench in shower? Yes  Elevated toilet seat or a  handicapped toilet? No   TIMED UP AND GO:  Was the test performed? No .    Cognitive Function:    06/17/2019    8:22 AM 06/11/2018    8:21 AM 06/06/2017    9:26 AM  MMSE - Mini Mental State Exam  Orientation to time '5 5 5  '$ Orientation to Place '5 5 5  '$ Registration '3 3 3  '$ Attention/ Calculation '5 5 5  '$ Recall '2 2 3  '$ Language- name 2 objects '2 2 2  '$ Language- repeat '1 1 1  '$ Language- follow 3 step command '3 3 3  '$ Language- read & follow direction '1 1 1  '$ Write a sentence '1 1 1  '$ Copy design '1 1 1  '$ Total score '29 29 30        '$ 09/14/2022   10:52 AM 07/28/2021    8:57 AM  6CIT Screen  What Year? 0 points 0 points  What month? 0 points 0 points  What time? 0 points 0 points  Count back from 20 0 points 0 points  Months in reverse 0 points 2 points  Repeat phrase 0 points 0 points  Total Score 0 points 2 points    Immunizations Immunization History  Administered Date(s) Administered   Fluad Quad(high Dose 65+) 05/21/2019, 06/02/2021, 06/06/2022   Influenza Split 06/01/2011   Influenza Whole 06/11/2008, 05/25/2009, 06/09/2010   Influenza, High Dose Seasonal PF 05/08/2013, 06/06/2017, 05/08/2020   Influenza-Unspecified 05/18/2015, 05/15/2018   PFIZER(Purple Top)SARS-COV-2 Vaccination 08/29/2019, 09/19/2019, 06/08/2020   PPD Test 06/01/2011   Pneumococcal Conjugate-13 06/06/2017   Pneumococcal Polysaccharide-23 01/06/2013   Td,absorbed, Preservative Free, Adult Use, Lf Unspecified 12/05/2021   Tdap 09/13/2011   Tetanus 12/05/2021   Varicella 12/24/2013   Zoster, Live 01/23/2014    TDAP status: Up to date  Flu Vaccine status: Up to date  Pneumococcal vaccine status: Up to date  Covid-19 vaccine status: Information provided on how to obtain vaccines.   Qualifies for Shingles Vaccine? Yes   Zostavax completed No   Shingrix Completed?: No.    Education has been provided regarding the importance of this vaccine. Patient has been advised to call insurance company to  determine out of pocket expense if they have not yet received this vaccine. Advised may also receive vaccine at local pharmacy or Health Dept. Verbalized acceptance and understanding.  Screening Tests Health Maintenance  Topic Date Due   Zoster Vaccines- Shingrix (1 of 2) Never done   COVID-19 Vaccine (4 - 2023-24 season) 04/08/2022   Medicare Annual Wellness (AWV)  09/15/2023   DTaP/Tdap/Td (4 - Td or Tdap) 12/06/2031   Pneumonia  Vaccine 68+ Years old  Completed   INFLUENZA VACCINE  Completed   DEXA SCAN  Completed   Hepatitis C Screening  Completed   HPV VACCINES  Aged Out   COLONOSCOPY (Pts 45-36yr Insurance coverage will need to be confirmed)  Discontinued    Health Maintenance  Health Maintenance Due  Topic Date Due   Zoster Vaccines- Shingrix (1 of 2) Never done   COVID-19 Vaccine (4 - 2023-24 season) 04/08/2022    Colorectal cancer screening: No longer required.   Mammogram status: No longer required due to age.  Bone Density status: Completed 12/30/20. Results reflect: Bone density results: OSTEOPOROSIS. Repeat every 2 years.  Lung Cancer Screening: (Low Dose CT Chest recommended if Age 77-80years, 30 pack-year currently smoking OR have quit w/in 15years.) does not qualify.    Additional Screening:  Hepatitis C Screening: does qualify; Completed 04/18/16  Vision Screening: Recommended annual ophthalmology exams for early detection of glaucoma and other disorders of the eye. Is the patient up to date with their annual eye exam?  Yes  Who is the provider or what is the name of the office in which the patient attends annual eye exams? [facility] Corona MD  Dental Screening: Recommended annual dental exams for proper oral hygiene  Community Resource Referral / Chronic Care Management: CRR required this visit?  No   CCM required this visit?  No      Plan:     I have personally reviewed and noted the following in the patient's chart:   Medical and social  history Use of alcohol, tobacco or illicit drugs  Current medications and supplements including opioid prescriptions. Patient is not currently taking opioid prescriptions. Functional ability and status Nutritional status Physical activity Advanced directives List of other physicians Hospitalizations, surgeries, and ER visits in previous 12 months Vitals Screenings to include cognitive, depression, and falls Referrals and appointments  In addition, I have reviewed and discussed with patient certain preventive protocols, quality metrics, and best practice recommendations. A written personalized care plan for preventive services as well as general preventive health recommendations were provided to patient.     [facility]avin Leech [facility]  09/14/2022   Nurse Notes: Non face to face 10 minutes.  Ms. WMasih, Thank you for taking time to come for your Medicare Wellness Visit. I appreciate your ongoing commitment to your health goals. Please review the following plan we discussed and let me know if I can assist you in the future.   These are the goals we discussed:  Goals      Patient Stated     Increase activity.     Patient Stated     Drink more water     Patient Stated     Lose weight         This is a list of the screening recommended for you and due dates:  Health Maintenance  Topic Date Due   Zoster (Shingles) Vaccine (1 of 2) Never done   COVID-19 Vaccine (4 - 2023-24 season) 04/08/2022   Medicare Annual Wellness Visit  09/15/2023   DTaP/Tdap/Td vaccine (4 - Td or Tdap) 12/06/2031   Pneumonia Vaccine  Completed   Flu Shot  Completed   DEXA scan (bone density measurement)  Completed   Hepatitis C Screening: USPSTF Recommendation to screen - Ages 152-79yo.  Completed   HPV Vaccine  Aged Out   Colon Cancer Screening  Discontinued

## 2022-09-15 ENCOUNTER — Encounter: Payer: Self-pay | Admitting: Internal Medicine

## 2022-09-15 ENCOUNTER — Encounter: Payer: Self-pay | Admitting: Family Medicine

## 2022-09-15 MED ORDER — VITAMIN D (ERGOCALCIFEROL) 1.25 MG (50000 UNIT) PO CAPS: 50000.0000 [IU] | ORAL_CAPSULE | ORAL | 1 refills | Status: DC

## 2022-09-15 NOTE — Telephone Encounter (Signed)
Over the counter Vit b12 1000 microgram sublingual tab every day.  I just sent in the high dose/once weekly vit D pill.

## 2022-09-21 ENCOUNTER — Ambulatory Visit: Payer: Medicare Other

## 2022-10-05 DIAGNOSIS — Z940 Kidney transplant status: Secondary | ICD-10-CM | POA: Diagnosis not present

## 2022-10-19 ENCOUNTER — Other Ambulatory Visit: Payer: Self-pay | Admitting: Family Medicine

## 2022-10-26 ENCOUNTER — Encounter: Payer: Self-pay | Admitting: Family Medicine

## 2022-10-26 ENCOUNTER — Ambulatory Visit (INDEPENDENT_AMBULATORY_CARE_PROVIDER_SITE_OTHER): Payer: Medicare Other | Admitting: Family Medicine

## 2022-10-26 VITALS — BP 131/76 | HR 73 | Temp 97.5°F | Wt 234.0 lb

## 2022-10-26 DIAGNOSIS — F411 Generalized anxiety disorder: Secondary | ICD-10-CM | POA: Diagnosis not present

## 2022-10-26 DIAGNOSIS — R300 Dysuria: Secondary | ICD-10-CM | POA: Diagnosis not present

## 2022-10-26 DIAGNOSIS — F4323 Adjustment disorder with mixed anxiety and depressed mood: Secondary | ICD-10-CM

## 2022-10-26 DIAGNOSIS — E039 Hypothyroidism, unspecified: Secondary | ICD-10-CM | POA: Diagnosis not present

## 2022-10-26 LAB — POC URINALSYSI DIPSTICK (AUTOMATED)
Bilirubin, UA: NEGATIVE
Blood, UA: NEGATIVE
Glucose, UA: NEGATIVE
Ketones, UA: NEGATIVE
Leukocytes, UA: NEGATIVE
Nitrite, UA: NEGATIVE
Protein, UA: NEGATIVE
Spec Grav, UA: 1.025 (ref 1.010–1.025)
Urobilinogen, UA: 1 E.U./dL
pH, UA: 6 (ref 5.0–8.0)

## 2022-10-26 NOTE — Progress Notes (Signed)
OFFICE VISIT  10/26/2022  CC:  Chief Complaint  Patient presents with   Medical Management of Chronic Issues    States she has been more nervous and shaky. She thinks she may also have a uti.    Patient is a 77 y.o. female who presents for increased anxiety and increased size of abdominal wall/ventral hernia.  HPI: Lots of anxiety. Describes itching without rash, random locations.  No clear trigger. Worried about her husband due to his recent health problems and lots of adjustments having to be made. Cannot stop worrying.  No SI or HI. She does take lorazepam 0.5 mg as needed, has been taking it more lately than usual. She does not take her citalopram.  Has gradually felt like her right lower abdominal wall hernia is growing.  No abdominal pain, no nausea or vomiting.  She has been having some dysuria lately.  Last visit her TSH was 6.51.  I advised her to take 1-1/2 levothyroxine tabs on 2 days a week, continue 1 tab daily all other days of the week.   Past Medical History:  Diagnosis Date   Anxiety    Arthritis    Cholelithiasis 04/2021   noted on CT abd/pelv done for R lower abd swelling and MR abd   Chronic renal insufficiency, stage III (moderate) (HCC) 10/2015   Post-transplant baseline sCr 1.2-1.4.     CMV infection [facility]) summer 2017   Valcyte per ID/Renal transplant team   Depression    Diverticulosis    a. 05/2012 colonoscopy   Erosive lichen planus of vulva    Topical steroids (managed by Dr. Mila Palmer [facility].   ESRD (end stage renal disease) ([place])    began dialysis 2014--followed by Dr. [name].  Received deceased donor kidney transplant 10/2015.   Fatty liver 10/2021   with hepatomegaly   FSGS (focal segmental glomerulosclerosis)    right kidney; hx of left renal cell cancer and got nephrectomy 1993.   GERD (gastroesophageal reflux disease)    Gout    Heart murmur    (Diastolic) ECHO 0000000 showed that this  murmur is coming from pt's R arm AV fistula   Hiatal hernia    History of renal cell cancer 1993   Left nephrectomy   Hyperlipidemia    Hypertension    Hypothyroidism    Impingement syndrome of left shoulder 04/2016   Dr. Christy Sartorius to PT   Kidney cancer, primary, with metastasis from kidney to other site [facility])    Lumbar spinal stenosis    Chronic LBP with bilat neurogenic claudication   Metatarsal fracture, pathologic 01/2018   Right; orthocarolina-->post op shoe continued, f/u x-ray planned.   Obesity    Osteoarthritis of left knee 08/2017   severe, diffuse, tricompartmental.  Responded to steroid injection.     Osteoporosis 06/07/2016   DEXA T-score of -3.1.  Fosamax planned 2017 but dental work prevented start..  Pathological toe fx 12/2017--repeat DEXA 08/2018, T-score -3.3 radius. 12/2020 T score radius -3.7.   Pneumonia    Renal transplant recipient 11/06/2015   Baseline Cr as of 09/2020 1.2-1.4   Simple hepatic cyst    2022   Solitary kidney, acquired    SVT (supraventricular tachycardia)    UTI (urinary tract infection)    Vitamin B12 deficiency 12/2018   Starting replacement as of 12/11/2018    Past Surgical History:  Procedure Laterality Date   AV FISTULA PLACEMENT Right    Right arm: aneurismal  dilatation 2017 being followed by CV surgeons   CARDIOVASCULAR STRESS TEST     03/10/15 ETT ([facility]): Exercise ECG negative at 83% max predicted HR.    CATARACT EXTRACTION     COLONOSCOPY  2003; 05/2012   diverticulosis, no polyps.  Recall 10 yrs   colonoscopy with polypectomy     Dr Henrene Pastor   DEXA  06/07/2016; 08/22/2018; 12/30/20   2017 T-score -3.1.  2020 T score -3.3.  2022 T score -3.7   HYSTERECTOMY ABDOMINAL WITH SALPINGECTOMY     KIDNEY TRANSPLANT  Nov 29, 2015   Deceased donor kidney transplant, with Thymoglobulin induction   LIGATION OF ARTERIOVENOUS  FISTULA Right 02/14/2017   Procedure: LIGATION OF ARTERIOVENOUS  FISTULA;  Surgeon: Angelia Mould,  MD;  Location: [place];  Service: Vascular;  Laterality: Right;   [place]   for malignancy- left   RENAL BIOPSY     right   RESECTION OF ARTERIOVENOUS FISTULA ANEURYSM Right 02/14/2017   Procedure: EXCISION OF RIGHT BRACHIOCEPHALIC ARTERIOVENOUS FISTULA ANEURYSM;  Surgeon: Angelia Mould, MD;  Location: [place];  Service: Vascular;  Laterality: Right;   TEE WITHOUT CARDIOVERSION N/A 08/27/2013   Procedure: TRANSESOPHAGEAL ECHOCARDIOGRAM (TEE);  Surgeon: [name], MD;  Location: [facility] ENDOSCOPY;  Service: Cardiovascular;  Laterality: N/A;   TOTAL ABDOMINAL HYSTERECTOMY W/ BILATERAL SALPINGOOPHORECTOMY  1997   fibroids   TRANSTHORACIC ECHOCARDIOGRAM     03/10/15 echo ([facility]): LV cavity normal in size, focal basal hypertrophy, normal systolic function, EF [id number] (visual est). Normal wall motion, no regional wall motion abnormalities. Mild diastolic dysfunction with normal LA chamber size. No significant valve stenosis or regurgitation.   VULVA / PERINEUM BIOPSY  2015    Outpatient Medications Prior to Visit  Medication Sig Dispense Refill   aspirin EC 81 MG EC tablet Take 1 tablet (81 mg total) by mouth daily.     AYR SALINE NASAL DROPS NA Place 2 sprays into both nostrils daily.     Camphor-Eucalyptus-Menthol (VICKS VAPORUB EX) Apply 1 application topically daily.     Cyanocobalamin (B-12 PO) Take by mouth daily.     doxazosin (CARDURA) 2 MG tablet TAKE 1 TABLET BY MOUTH TWICE  DAILY 90 tablet 1   hydrochlorothiazide (HYDRODIURIL) 25 MG tablet Take 1 tablet (25 mg total) by mouth daily. 90 tablet 3   levothyroxine (SYNTHROID) 125 MCG tablet Take 1 tablet (125 mcg total) by mouth daily. 90 tablet 3   LORazepam (ATIVAN) 0.5 MG tablet Take 1 tablet (0.5 mg total) by mouth 2 (two) times daily as needed for anxiety. 30 tablet 1   losartan (COZAAR) 100 MG tablet Take 1 tablet (100 mg total) by mouth daily. 90 tablet 3   mycophenolate (MYFORTIC) 360 MG TBEC EC  tablet Take 360 mg by mouth 2 (two) times daily.     sodium bicarbonate 650 MG tablet Take 1 tablet (650 mg total) by mouth 2 (two) times daily. Takes 1 in AM and 1 at bedtime 180 tablet 3   tacrolimus (PROGRAF) 1 MG capsule Take 4 mg by mouth 2 (two) times daily. Pt takes 3 in the morning and 3 at night.     triamcinolone cream (KENALOG) 0.1 % Apply 1 application topically daily.     verapamil (CALAN-SR) 120 MG CR tablet TAKE 1 TABLET BY MOUTH EVERY  AFTERNOON 90 tablet 2   verapamil (CALAN-SR) 240 MG CR tablet Take 1 tablet (240 mg total) by mouth every morning. [place]  tablet 3   Vitamin D, Ergocalciferol, (DRISDOL) 1.25 MG (50000 UNIT) CAPS capsule Take 1 capsule (50,000 Units total) by mouth every 7 (seven) days. 12 capsule 1   alendronate (FOSAMAX) 70 MG tablet Take 1 tablet (70 mg total) by mouth every 7 (seven) days. Take with a full glass of water on an empty stomach. (Patient not taking: Reported on 10/26/2022) 12 tablet 3   citalopram (CELEXA) 20 MG tablet Take 1 tablet (20 mg total) by mouth daily. (Patient not taking: Reported on 10/26/2022) 30 tablet 1   triamcinolone (NASACORT) 55 MCG/ACT AERO nasal inhaler Place 2 sprays into the nose daily. (Patient not taking: Reported on 10/26/2022)     No facility-administered medications prior to visit.    Allergies  Allergen Reactions   Labetalol     Other reaction(s): Other   Cefaclor Rash   Naproxen Rash   Enalapril Maleate Cough    Vasotec   Metoprolol Tartrate Rash    On legs    Review of Systems  As per HPI  PE:    10/26/2022    1:15 PM 09/13/2022    8:52 AM 08/03/2022    3:10 PM  Vitals with BMI  Height  5\' 5"  5\' 5"   Weight 234 lbs 238 lbs 245 lbs 10 oz  BMI  [id number]  Systolic [id number]  Diastolic 76 77 71  Pulse 73 72 70     Physical Exam Exam chaperoned by Shepard General, CMA  Gen: Alert, well appearing.  Patient is oriented to person, place, time, and situation. Affect: anxious, occ fights back tears.   Lucid. Abdomen is soft, nontender.  Large right lower quadrant abdominal wall hernia, easily reducible.  Bowel sounds normal.  LABS:  Last CBC Lab Results  Component Value Date   WBC 6.5 09/13/2022   HGB 13.3 09/13/2022   HCT 39.7 09/13/2022   MCV 89.6 09/13/2022   MCH 31.8 09/16/2015   RDW 14.4 09/13/2022   PLT 221.0 0000000   Last metabolic panel Lab Results  Component Value Date   GLUCOSE 110 (H) 09/13/2022   NA 140 09/13/2022   K 3.8 09/13/2022   CL 104 09/13/2022   CO2 25 09/13/2022   BUN 15 09/13/2022   CREATININE 1.01 09/13/2022   EGFR 52 06/01/2022   CALCIUM 9.6 09/13/2022   PHOS 2.9 01/04/2018   PROT 6.6 09/13/2022   ALBUMIN 4.1 09/13/2022   BILITOT 0.7 09/13/2022   ALKPHOS 69 09/13/2022   AST 18 09/13/2022   ALT 15 09/13/2022   IMPRESSION AND PLAN:  #1 GAD with superimposed adjustment disorder with mixed anxious and depressed mood. Strongly encouraged her to start her citalopram 20 mg daily.  She expressed understanding and agreement. Continue lorazepam 0.5 mg twice daily as needed. She will consider counseling.  2.  Right lower abdominal ventral hernia. Possible gradual enlargement but essentially is asymptomatic.  No sign of complication. Reassured.  3.  Dysuria. POC UA today all normal. Reassured.  4.  Acquired hypothyroidism.  1-1/2 levothyroxine 125 mcg tabs on 2 days a week,  1 tab daily all other days of the week.  Due for TSH recheck soon.  An After Visit Summary was printed and given to the patient.  FOLLOW UP: Return in about 4 weeks (around 11/23/2022) for anxiety. Next CPE 09/2023  Signed:  Crissie Sickles, MD           10/26/2022

## 2022-10-27 NOTE — Telephone Encounter (Signed)
Over the counter generic zyrtec 10mg  daily is good.

## 2022-11-03 ENCOUNTER — Encounter: Payer: Self-pay | Admitting: Family Medicine

## 2022-11-03 ENCOUNTER — Other Ambulatory Visit: Payer: Self-pay

## 2022-11-03 ENCOUNTER — Telehealth: Payer: Self-pay | Admitting: Family Medicine

## 2022-11-03 DIAGNOSIS — I10 Essential (primary) hypertension: Secondary | ICD-10-CM

## 2022-11-03 MED ORDER — DOXAZOSIN MESYLATE 2 MG PO TABS: 2.0000 mg | ORAL_TABLET | Freq: Two times a day (BID) | ORAL | 0 refills | Status: DC

## 2022-11-03 NOTE — Telephone Encounter (Signed)
Refill on Doxazosin sent to requested pharmacy

## 2022-11-03 NOTE — Telephone Encounter (Signed)
Pt is currently in route to [facility] and is needing a refill on her medication Doxazosi. The pharmacy she is requesting this be sent to is [facility] located at [address] [place], [place]

## 2022-11-08 ENCOUNTER — Ambulatory Visit
Admission: RE | Admit: 2022-11-08 | Discharge: 2022-11-08 | Disposition: A | Discharge status: Home / Self Care | Payer: Medicare Other | Source: Ambulatory Visit | Attending: Family Medicine | Admitting: Family Medicine

## 2022-11-08 ENCOUNTER — Encounter (HOSPITAL_COMMUNITY): Payer: Self-pay

## 2022-11-08 VITALS — BP 119/76 | HR 71 | Temp 98.4°F | Resp 16 | Ht 65.0 in | Wt 237.0 lb

## 2022-11-08 DIAGNOSIS — L299 Pruritus, unspecified: Secondary | ICD-10-CM

## 2022-11-08 MED ORDER — METHYLPREDNISOLONE 4 MG PO TBPK: ORAL_TABLET | ORAL | 0 refills | Status: DC

## 2022-11-08 NOTE — ED Triage Notes (Signed)
Patient c/o itching off and on x 2 weeks.  Patient is itching on your face, legs, arms, head and tongue worse at night.  Patient denies any OTC meds.  No change in lotion, soaps or detergents.

## 2022-11-08 NOTE — Discharge Instructions (Addendum)
Instructed patient to take medications as directed with food to completion.  Encouraged increase daily water intake while taking this medication.  Advised if symptoms worsen and/or unresolved please follow-up with PCP or here for further evaluation.

## 2022-11-08 NOTE — ED Provider Notes (Signed)
[name]    CSN: [id number] Arrival date & time: 11/08/22  1343      History   Chief Complaint Chief Complaint  Patient presents with   Allergic Reaction    I have been itching off and on for about 2 weeks. - Entered by patient    HPI Tabitha Green is a 77 y.o. female.   HPI 77 year old female presents with possible allergic reaction.  Reports itching on and off for 2 weeks.  PMH significant for obesity, CKD stage IV, SVT, and hypothyroidism.  Past Medical History:  Diagnosis Date   Anxiety    Arthritis    Cholelithiasis 04/2021   noted on CT abd/pelv done for R lower abd swelling and MR abd   Chronic renal insufficiency, stage III (moderate) 10/2015   Post-transplant baseline sCr 1.2-1.4.     CMV infection summer 2017   Valcyte per ID/Renal transplant team   Depression    Diverticulosis    a. 05/2012 colonoscopy   Erosive lichen planus of vulva    Topical steroids (managed by Dr. Mila Palmer [facility].   ESRD (end stage renal disease)    began dialysis 2014--followed by Dr. [name].  Received deceased donor kidney transplant 10/2015.   Fatty liver 10/2021   with hepatomegaly   FSGS (focal segmental glomerulosclerosis)    right kidney; hx of left renal cell cancer and got nephrectomy 1993.   GERD (gastroesophageal reflux disease)    Gout    Heart murmur    (Diastolic) ECHO 0000000 showed that this murmur is coming from pt's R arm AV fistula   Hiatal hernia    History of renal cell cancer 1993   Left nephrectomy   Hyperlipidemia    Hypertension    Hypothyroidism    Impingement syndrome of left shoulder 04/2016   Dr. Christy Sartorius to PT   Kidney cancer, primary, with metastasis from kidney to other site    Lumbar spinal stenosis    Chronic LBP with bilat neurogenic claudication   Metatarsal fracture, pathologic 01/2018   Right; orthocarolina-->post op shoe continued, f/u x-ray planned.   Obesity     Osteoarthritis of left knee 08/2017   severe, diffuse, tricompartmental.  Responded to steroid injection.     Osteoporosis 06/07/2016   DEXA T-score of -3.1.  Fosamax planned 2017 but dental work prevented start..  Pathological toe fx 12/2017--repeat DEXA 08/2018, T-score -3.3 radius. 12/2020 T score radius -3.7.   Pneumonia    Renal transplant recipient 11/06/2015   Baseline Cr as of 09/2020 1.2-1.4   Simple hepatic cyst    2022   Solitary kidney, acquired    SVT (supraventricular tachycardia)    UTI (urinary tract infection)    Vitamin B12 deficiency 12/2018   Starting replacement as of 12/11/2018    Patient Active Problem List   Diagnosis Date Noted   Combined forms of age-related cataract of right eye 10/20/2020   COVID-19 08/06/2020   Lumbar spinal stenosis 02/05/2019   Cherry angioma 01/14/2018   Lentigines 01/14/2018   Multiple benign nevi 01/14/2018   Pain in left knee 08/28/2017   Seborrheic keratosis 11/23/2016   Left shoulder pain 05/02/2016   Wheeze 09/16/2015   Cough 09/16/2015   Acute bronchitis 09/16/2015   History of renal carcinoma 05/14/2015   Vulvar ulceration 06/22/2014   End stage renal disease 04/09/2014   Mechanical complication of other vascular device, implant, and graft 04/09/2014   Cervical spondylosis 10/10/2013  Aortic insufficiency 08/20/2013   Hyperglycemia 12/14/2012   SVT (supraventricular tachycardia) 12/13/2012   Uremia 12/13/2012   Chronic kidney disease (CKD), stage IV (severe) 12/07/2012   Bradycardia 12/07/2012   VITAMIN D DEFICIENCY 09/08/2009   CHRON GLOMERULONEPHRIT W/LES MEMBRANOUS GLN 09/08/2009   FATIGUE 09/08/2009   DIVERTICULOSIS, COLON 09/26/2008   OSTEOPENIA 09/26/2008   COLONIC POLYPS, HX OF 09/26/2008   Hypothyroidism 10/02/2007   OTHER AND UNSPECIFIED HYPERLIPIDEMIA 10/02/2007   BENIGN POSITIONAL VERTIGO 10/02/2007   Unspecified essential hypertension 10/02/2007   Gout, unspecified 03/26/2007   DISORDER, DYSMETABOLIC  SYNDROME X 0000000   HX, PERSONAL, MALIGNANCY, KIDNEY 03/26/2007   OSTEOARTHRITIS 12/26/2006    Past Surgical History:  Procedure Laterality Date   AV FISTULA PLACEMENT Right    Right arm: aneurismal dilatation 2017 being followed by CV surgeons   CARDIOVASCULAR STRESS TEST     03/10/15 ETT ([facility]): Exercise ECG negative at 83% max predicted HR.    CATARACT EXTRACTION     COLONOSCOPY  2003; 05/2012   diverticulosis, no polyps.  Recall 10 yrs   colonoscopy with polypectomy     Dr Henrene Pastor   DEXA  06/07/2016; 08/22/2018; 12/30/20   2017 T-score -3.1.  2020 T score -3.3.  2022 T score -3.7   HYSTERECTOMY ABDOMINAL WITH SALPINGECTOMY     KIDNEY TRANSPLANT  2015-11-24   Deceased donor kidney transplant, with Thymoglobulin induction   LIGATION OF ARTERIOVENOUS  FISTULA Right 02/14/2017   Procedure: LIGATION OF ARTERIOVENOUS  FISTULA;  Surgeon: Angelia Mould, MD;  Location: [place];  Service: Vascular;  Laterality: Right;   [place]   for malignancy- left   RENAL BIOPSY     right   RESECTION OF ARTERIOVENOUS FISTULA ANEURYSM Right 02/14/2017   Procedure: EXCISION OF RIGHT BRACHIOCEPHALIC ARTERIOVENOUS FISTULA ANEURYSM;  Surgeon: Angelia Mould, MD;  Location: [place];  Service: Vascular;  Laterality: Right;   TEE WITHOUT CARDIOVERSION N/A 08/27/2013   Procedure: TRANSESOPHAGEAL ECHOCARDIOGRAM (TEE);  Surgeon: [name], MD;  Location: [facility] ENDOSCOPY;  Service: Cardiovascular;  Laterality: N/A;   TOTAL ABDOMINAL HYSTERECTOMY W/ BILATERAL SALPINGOOPHORECTOMY  1997   fibroids   TRANSTHORACIC ECHOCARDIOGRAM     03/10/15 echo ([facility]): LV cavity normal in size, focal basal hypertrophy, normal systolic function, EF [id number] (visual est). Normal wall motion, no regional wall motion abnormalities. Mild diastolic dysfunction with normal LA chamber size. No significant valve stenosis or regurgitation.   VULVA / PERINEUM BIOPSY  2015    OB History      Gravida  2   Para  2   Term  2   Preterm  0   AB  0   Living  2      SAB  0   IAB  0   Ectopic  0   Multiple  0   Live Births               Home Medications    Prior to Admission medications   Medication Sig Start Date End Date Taking? Authorizing Provider  aspirin EC 81 MG EC tablet Take 1 tablet (81 mg total) by mouth daily. 12/09/12  Yes Edmisten, Brooke O, PA-C  AYR SALINE NASAL DROPS NA Place 2 sprays into both nostrils daily.   Yes [provider]  Camphor-Eucalyptus-Menthol (VICKS VAPORUB EX) Apply 1 application topically daily.   Yes [provider]  Cyanocobalamin (B-12 PO) Take by mouth daily.   Yes [provider]  doxazosin (CARDURA) 2 MG tablet Take 1 tablet (2 mg total) by mouth 2 (two) times daily. 11/03/22  Yes McGowen, Adrian Blackwater, MD  hydrochlorothiazide (HYDRODIURIL) 25 MG tablet Take 1 tablet (25 mg total) by mouth daily. 08/30/22  Yes McGowen, Adrian Blackwater, MD  levothyroxine (SYNTHROID) 125 MCG tablet Take 1 tablet (125 mcg total) by mouth daily. 08/25/22  Yes McGowen, Adrian Blackwater, MD  LORazepam (ATIVAN) 0.5 MG tablet Take 1 tablet (0.5 mg total) by mouth 2 (two) times daily as needed for anxiety. 08/12/22  Yes McGowen, Adrian Blackwater, MD  losartan (COZAAR) 100 MG tablet Take 1 tablet (100 mg total) by mouth daily. 08/25/22  Yes McGowen, Adrian Blackwater, MD  methylPREDNISolone (MEDROL DOSEPAK) 4 MG TBPK tablet Take as directed 11/08/22  Yes Eliezer Lofts, FNP  mycophenolate (MYFORTIC) 360 MG TBEC EC tablet Take 360 mg by mouth 2 (two) times daily.   Yes [provider]  sodium bicarbonate 650 MG tablet Take 1 tablet (650 mg total) by mouth 2 (two) times daily. Takes 1 in AM and 1 at bedtime 08/25/22  Yes McGowen, Adrian Blackwater, MD  tacrolimus (PROGRAF) 1 MG capsule Take 4 mg by mouth 2 (two) times daily. Pt takes 3 in the morning and 3 at night.   Yes [provider]  triamcinolone cream (KENALOG) 0.1 % Apply 1 application topically daily.    Yes [provider]  verapamil (CALAN-SR) 120 MG CR tablet TAKE 1 TABLET BY MOUTH EVERY  AFTERNOON 10/20/22  Yes McGowen, Adrian Blackwater, MD  verapamil (CALAN-SR) 240 MG CR tablet Take 1 tablet (240 mg total) by mouth every morning. 08/30/22  Yes McGowen, Adrian Blackwater, MD  Vitamin D, Ergocalciferol, (DRISDOL) 1.25 MG (50000 UNIT) CAPS capsule Take 1 capsule (50,000 Units total) by mouth every 7 (seven) days. 09/15/22  Yes McGowen, Adrian Blackwater, MD    Family History Family History  Problem Relation Age of Onset   Deep vein thrombosis Mother        post thyroid surgery   Hypertension Mother    Heart attack Father 80       deceased   Hypertension Father    Cancer Sister        breast   Cancer Paternal Aunt        pancreatic   Diabetes Maternal Grandfather    Stroke Paternal Grandmother        in 49s   Fibroids Daughter    Colon cancer Neg Hx    Esophageal cancer Neg Hx    Stomach cancer Neg Hx    Rectal cancer Neg Hx    Sleep apnea Neg Hx     Social History Social History   Tobacco Use   Smoking status: Never   Smokeless tobacco: Never  Vaping Use   Vaping Use: Never used  Substance Use Topics   Alcohol use: Yes    Comment: rarely   Drug use: No     Allergies   Labetalol, Cefaclor, Naproxen, Enalapril maleate, and Metoprolol tartrate   Review of Systems Review of Systems  Skin:  Positive for rash.  All other systems reviewed and are negative.    Physical Exam Triage Vital Signs ED Triage Vitals  Enc Vitals Group     BP      Pulse      Resp      Temp      Temp src      SpO2      Weight  Height      Head Circumference      Peak Flow      Pain Score      Pain Loc      Pain Edu?      Excl. in [place]?    No data found.  Updated Vital Signs BP 119/76 (BP Location: Left Arm)   Pulse 71   Temp 98.4 F (36.9 C) (Oral)   Resp 16   Ht 5\' 5"  (1.651 m)   Wt 237 lb (107.5 kg)   SpO2 97%   BMI 39.44 kg/m   Physical Exam Vitals and nursing note reviewed.   Constitutional:      General: She is not in acute distress.    Appearance: Normal appearance. She is obese. She is not ill-appearing.  HENT:     Head: Normocephalic and atraumatic.     Mouth/Throat:     Mouth: Mucous membranes are moist.     Pharynx: Oropharynx is clear.  Eyes:     Extraocular Movements: Extraocular movements intact.     Conjunctiva/sclera: Conjunctivae normal.     Pupils: Pupils are equal, round, and reactive to light.  Cardiovascular:     Rate and Rhythm: Normal rate and regular rhythm.     Pulses: Normal pulses.     Heart sounds: Normal heart sounds.  Pulmonary:     Effort: Pulmonary effort is normal.     Breath sounds: No wheezing, rhonchi or rales.  Musculoskeletal:        General: Normal range of motion.     Cervical back: Normal range of motion and neck supple.  Skin:    General: Skin is warm and dry.  Neurological:     General: No focal deficit present.     Mental Status: She is alert and oriented to person, place, and time. Mental status is at baseline.      UC Treatments / Results  Labs (all labs ordered are listed, but only abnormal results are displayed) Labs Reviewed - No data to display  EKG   Radiology No results found.  Procedures Procedures (including critical care time)  Medications Ordered in UC Medications - No data to display  Initial Impression / Assessment and Plan / UC Course  I have reviewed the triage vital signs and the nursing notes.  Pertinent labs & imaging results that were available during my care of the patient were reviewed by me and considered in my medical decision making (see chart for details).     MDM: 1.  Pruritus-Rx'd Medrol Dosepak (tapering from 32 mg to 4 mg over 5 days) lab review of most recent CMP drawn on 09/13/2022 completed prior to prescribing this medication. Instructed patient to take medications as directed with food to completion.  Encouraged increase daily water intake while taking this  medication.  Advised if symptoms worsen and/or unresolved please follow-up with PCP or here for further evaluation.  Discharged home, hemodynamically stable. Final Clinical Impressions(s) / UC Diagnoses   Final diagnoses:  Pruritus     Discharge Instructions      Instructed patient to take medications as directed with food to completion.  Encouraged increase daily water intake while taking this medication.  Advised if symptoms worsen and/or unresolved please follow-up with PCP or here for further evaluation.     ED Prescriptions     Medication Sig Dispense Auth. Provider   methylPREDNISolone (MEDROL DOSEPAK) 4 MG TBPK tablet Take as directed 1 each Eliezer Lofts, FNP  PDMP not reviewed this encounter.   Eliezer Lofts, [place] 11/08/22 1455

## 2022-11-09 ENCOUNTER — Telehealth: Payer: Self-pay | Admitting: Emergency Medicine

## 2022-11-09 NOTE — Telephone Encounter (Signed)
Call to Tabitha Green to see how she was today - pt has not started medrol dose pak  yet, will p/u today. Pt states she has less itching today. No other questions at this time

## 2022-11-10 ENCOUNTER — Other Ambulatory Visit: Payer: Self-pay

## 2022-11-11 ENCOUNTER — Ambulatory Visit: Payer: Medicare Other | Admitting: Family Medicine

## 2022-11-13 ENCOUNTER — Encounter: Payer: Self-pay | Admitting: Family Medicine

## 2022-11-14 ENCOUNTER — Other Ambulatory Visit: Payer: Self-pay

## 2022-11-14 DIAGNOSIS — I10 Essential (primary) hypertension: Secondary | ICD-10-CM

## 2022-11-14 MED ORDER — DOXAZOSIN MESYLATE 2 MG PO TABS: 2.0000 mg | ORAL_TABLET | Freq: Two times a day (BID) | ORAL | 0 refills | Status: DC

## 2022-11-15 DIAGNOSIS — I10 Essential (primary) hypertension: Secondary | ICD-10-CM | POA: Diagnosis not present

## 2022-11-15 DIAGNOSIS — Z7982 Long term (current) use of aspirin: Secondary | ICD-10-CM | POA: Diagnosis not present

## 2022-11-15 DIAGNOSIS — Z940 Kidney transplant status: Secondary | ICD-10-CM | POA: Diagnosis not present

## 2022-11-15 DIAGNOSIS — Z79899 Other long term (current) drug therapy: Secondary | ICD-10-CM | POA: Diagnosis not present

## 2022-11-15 DIAGNOSIS — L299 Pruritus, unspecified: Secondary | ICD-10-CM | POA: Diagnosis not present

## 2022-11-15 DIAGNOSIS — L570 Actinic keratosis: Secondary | ICD-10-CM | POA: Diagnosis not present

## 2022-11-15 DIAGNOSIS — R531 Weakness: Secondary | ICD-10-CM | POA: Diagnosis not present

## 2022-11-15 DIAGNOSIS — R9431 Abnormal electrocardiogram [ECG] [EKG]: Secondary | ICD-10-CM | POA: Diagnosis not present

## 2022-11-15 DIAGNOSIS — R5383 Other fatigue: Secondary | ICD-10-CM | POA: Diagnosis not present

## 2022-11-15 DIAGNOSIS — E039 Hypothyroidism, unspecified: Secondary | ICD-10-CM | POA: Diagnosis not present

## 2022-11-15 DIAGNOSIS — L304 Erythema intertrigo: Secondary | ICD-10-CM | POA: Diagnosis not present

## 2022-11-15 DIAGNOSIS — E0590 Thyrotoxicosis, unspecified without thyrotoxic crisis or storm: Secondary | ICD-10-CM | POA: Diagnosis not present

## 2022-11-15 DIAGNOSIS — L438 Other lichen planus: Secondary | ICD-10-CM | POA: Diagnosis not present

## 2022-11-15 DIAGNOSIS — I4891 Unspecified atrial fibrillation: Secondary | ICD-10-CM | POA: Diagnosis not present

## 2022-11-15 DIAGNOSIS — Z85528 Personal history of other malignant neoplasm of kidney: Secondary | ICD-10-CM | POA: Diagnosis not present

## 2022-11-16 ENCOUNTER — Encounter: Payer: Self-pay | Admitting: Family Medicine

## 2022-11-16 DIAGNOSIS — Z940 Kidney transplant status: Secondary | ICD-10-CM | POA: Diagnosis not present

## 2022-11-16 NOTE — Telephone Encounter (Signed)
OK. I can see all the records from her emergency dept visit. thx

## 2022-11-17 ENCOUNTER — Encounter: Payer: Self-pay | Admitting: Family Medicine

## 2022-11-17 ENCOUNTER — Ambulatory Visit (INDEPENDENT_AMBULATORY_CARE_PROVIDER_SITE_OTHER): Payer: Medicare Other | Admitting: Family Medicine

## 2022-11-17 VITALS — BP 119/84 | HR 107 | Wt 232.2 lb

## 2022-11-17 DIAGNOSIS — N183 Chronic kidney disease, stage 3 unspecified: Secondary | ICD-10-CM

## 2022-11-17 DIAGNOSIS — E039 Hypothyroidism, unspecified: Secondary | ICD-10-CM

## 2022-11-17 DIAGNOSIS — I480 Paroxysmal atrial fibrillation: Secondary | ICD-10-CM

## 2022-11-17 DIAGNOSIS — E538 Deficiency of other specified B group vitamins: Secondary | ICD-10-CM | POA: Diagnosis not present

## 2022-11-17 NOTE — Progress Notes (Signed)
OFFICE VISIT  11/17/2022  CC:  Chief Complaint  Patient presents with   Atrial Fibrillation    Possible Afib. She has also been having issues with her mouth and tongue stinging.    Patient is a 77 y.o. female who presents for follow-up emergency department visit for 924.  HPI: Tabitha Green [facility]/2022 for general fatigue and poor stamina.  Evaluation revealed atrial fibrillation with normal ventricular response.  She was started on Eliquis 5 mg twice daily. Troponin negative. CBC normal. Complete metabolic panel normal except creatinine 1.22, which is just a little above her baseline. Magnesium was a little bit low at 1.3.  TSH was 0.07.  Chest x-ray showed no acute cardiopulmonary process. EKG showed A-fib, ventricular rate 86.  UPDATE: Very anxious, otherwise no acute symptoms. Intermittent and brief feeling of heart fluttering, usually in evenings at rest, has noted for the last couple weeks.  She attributed her sx's to anxiety.  No dizziness, no CP, no SOB, no fevers, no LE swelling or pain.    Past Medical History:  Diagnosis Date   Anxiety    Arthritis    Cholelithiasis 04/2021   noted on CT abd/pelv done for R lower abd swelling and MR abd   Chronic renal insufficiency, stage III (moderate) 10/2015   Post-transplant baseline sCr 1.2-1.4.     CMV infection summer 2017   Valcyte per ID/Renal transplant team   Depression    Diverticulosis    a. 05/2012 colonoscopy   Erosive lichen planus of vulva    Topical steroids (managed by Dr. Robby Sermonita O [facility] via [facility].   ESRD (end stage renal disease)    began dialysis 2014--followed by Dr. Lowell GuitarPowell.  Received deceased donor kidney transplant 10/2015.   Fatty liver 10/2021   with hepatomegaly   FSGS (focal segmental glomerulosclerosis)    right kidney; hx of left renal cell cancer and got nephrectomy 1993.   GERD  (gastroesophageal reflux disease)    Gout    Heart murmur    (Diastolic) ECHO 08/2013 showed that this murmur is coming from pt's R arm AV fistula   Hiatal hernia    History of renal cell cancer 1993   Left nephrectomy   Hyperlipidemia    Hypertension    Hypothyroidism    Impingement syndrome of left shoulder 04/2016   Dr. Bertram SavinHudnall--responded to PT   Kidney cancer, primary, with metastasis from kidney to other site    Lumbar spinal stenosis    Chronic LBP with bilat neurogenic claudication   Metatarsal fracture, pathologic 01/2018   Right; orthocarolina-->post op shoe continued, f/u x-ray planned.   Obesity    Osteoarthritis of left knee 08/2017   severe, diffuse, tricompartmental.  Responded to steroid injection.     Osteoporosis 06/07/2016   DEXA T-score of -3.1.  Fosamax planned 2017 but dental work prevented start..  Pathological toe fx 12/2017--repeat DEXA 08/2018, T-score -3.3 radius. 12/2020 T score radius -3.7.   Pneumonia    Renal transplant recipient 11/06/2015   Baseline Cr as of 09/2020 1.2-1.4   Simple hepatic cyst    2022   Solitary kidney, acquired    SVT (supraventricular tachycardia)    UTI (urinary tract infection)    Vitamin B12 deficiency 12/2018   Starting replacement as of 12/11/2018    Past Surgical History:  Procedure Laterality Date   AV FISTULA PLACEMENT Right    Right arm: aneurismal dilatation 2017  being followed by CV surgeons   CARDIOVASCULAR STRESS TEST     03/10/15 ETT ([facility]): Exercise ECG negative at 83% max predicted HR.    CATARACT EXTRACTION     COLONOSCOPY  2003; 05/2012   diverticulosis, no polyps.  Recall 10 yrs   colonoscopy with polypectomy     Dr Marina Goodell   DEXA  06/07/2016; 08/22/2018; 12/30/20   2017 T-score -3.1.  2020 T score -3.3.  2022 T score -3.7   HYSTERECTOMY ABDOMINAL WITH SALPINGECTOMY     KIDNEY TRANSPLANT  November 17, 2015   Deceased donor kidney transplant, with Thymoglobulin induction   LIGATION OF ARTERIOVENOUS  FISTULA  Right 02/14/2017   Procedure: LIGATION OF ARTERIOVENOUS  FISTULA;  Surgeon: Chuck Hint, MD;  Location: [facility] OR;  Service: Vascular;  Laterality: Right;   NEPHRECTOMY  1993   for malignancy- left   RENAL BIOPSY     right   RESECTION OF ARTERIOVENOUS FISTULA ANEURYSM Right 02/14/2017   Procedure: EXCISION OF RIGHT BRACHIOCEPHALIC ARTERIOVENOUS FISTULA ANEURYSM;  Surgeon: Chuck Hint, MD;  Location: [facility] OR;  Service: Vascular;  Laterality: Right;   TEE WITHOUT CARDIOVERSION N/A 08/27/2013   Procedure: TRANSESOPHAGEAL ECHOCARDIOGRAM (TEE);  Surgeon: Wendall Stade, MD;  Location: [facility] ENDOSCOPY;  Service: Cardiovascular;  Laterality: N/A;   TOTAL ABDOMINAL HYSTERECTOMY W/ BILATERAL SALPINGOOPHORECTOMY  1997   fibroids   TRANSTHORACIC ECHOCARDIOGRAM     03/10/15 echo ([facility]): LV cavity normal in size, focal basal hypertrophy, normal systolic function, EF 55% (visual est). Normal wall motion, no regional wall motion abnormalities. Mild diastolic dysfunction with normal LA chamber size. No significant valve stenosis or regurgitation.   VULVA / PERINEUM BIOPSY  2015    Outpatient Medications Prior to Visit  Medication Sig Dispense Refill   AYR SALINE NASAL DROPS NA Place 2 sprays into both nostrils daily.     Camphor-Eucalyptus-Menthol (VICKS VAPORUB EX) Apply 1 application topically daily.     Cyanocobalamin (B-12 PO) Take by mouth daily.     doxazosin (CARDURA) 2 MG tablet Take 1 tablet (2 mg total) by mouth 2 (two) times daily. 180 tablet 0   hydrochlorothiazide (HYDRODIURIL) 25 MG tablet Take 1 tablet (25 mg total) by mouth daily. 90 tablet 3   levothyroxine (SYNTHROID) 125 MCG tablet Take 1 tablet (125 mcg total) by mouth daily. 90 tablet 3   LORazepam (ATIVAN) 0.5 MG tablet Take 1 tablet (0.5 mg total) by mouth 2 (two) times daily as needed for anxiety. 30 tablet 1   losartan (COZAAR) 100 MG tablet Take 1 tablet (100 mg total) by mouth daily. 90 tablet  3   mycophenolate (MYFORTIC) 360 MG TBEC EC tablet Take 360 mg by mouth 2 (two) times daily.     sodium bicarbonate 650 MG tablet Take 1 tablet (650 mg total) by mouth 2 (two) times daily. Takes 1 in AM and 1 at bedtime 180 tablet 3   tacrolimus (PROGRAF) 1 MG capsule Take 4 mg by mouth 2 (two) times daily. Pt takes 3 in the morning and 3 at night.     triamcinolone cream (KENALOG) 0.1 % Apply 1 application topically daily.     verapamil (CALAN-SR) 120 MG CR tablet TAKE 1 TABLET BY MOUTH EVERY  AFTERNOON 90 tablet 2   verapamil (CALAN-SR) 240 MG CR tablet Take 1 tablet (240 mg total) by mouth every morning. 90 tablet 3   Vitamin D, Ergocalciferol, (DRISDOL) 1.25 MG (50000 UNIT) CAPS capsule Take 1 capsule (  50,000 Units total) by mouth every 7 (seven) days. 12 capsule 1   aspirin EC 81 MG EC tablet Take 1 tablet (81 mg total) by mouth daily.     methylPREDNISolone (MEDROL DOSEPAK) 4 MG TBPK tablet Take as directed (Patient not taking: Reported on 11/17/2022) 1 each 0   No facility-administered medications prior to visit.    Allergies  Allergen Reactions   Labetalol     Other reaction(s): Other   Cefaclor Rash   Naproxen Rash   Enalapril Maleate Cough    Vasotec   Metoprolol Tartrate Rash    On legs    Review of Systems As per HPI  PE:    11/17/2022    2:57 PM 11/08/2022    2:03 PM 11/08/2022    2:01 PM  Vitals with BMI  Height  5\' 5"    Weight 232 lbs 3 oz 237 lbs   BMI 38.64 39.44   Systolic 119  119  Diastolic 84  76  Pulse 107  71     Physical Exam  Gen: Alert, well appearing.  Patient is oriented to person, place, time, and situation. AFFECT: pleasant and very anxious, fighting back tears at times, lucid thought and speech. Cardiovascular: Regular rhythm, tachycardic 100-110.  No murmur. Lungs are clear bilaterally, breathing nonlabored. Extremities show no edema.   LABS:  Last CBC Lab Results  Component Value Date   WBC 6.5 09/13/2022   HGB 13.3 09/13/2022    HCT 39.7 09/13/2022   MCV 89.6 09/13/2022   MCH 31.8 09/16/2015   RDW 14.4 09/13/2022   PLT 221.0 09/13/2022   Last metabolic panel Lab Results  Component Value Date   GLUCOSE 110 (H) 09/13/2022   NA 140 09/13/2022   K 3.8 09/13/2022   CL 104 09/13/2022   CO2 25 09/13/2022   BUN 15 09/13/2022   CREATININE 1.01 09/13/2022   EGFR 52 06/01/2022   CALCIUM 9.6 09/13/2022   PHOS 2.9 01/04/2018   PROT 6.6 09/13/2022   ALBUMIN 4.1 09/13/2022   BILITOT 0.7 09/13/2022   ALKPHOS 69 09/13/2022   AST 18 09/13/2022   ALT 15 09/13/2022   Last thyroid functions Lab Results  Component Value Date   TSH 6.51 (H) 09/13/2022   Lab Results  Component Value Date   VITAMINB12 183 (L) 09/13/2022    IMPRESSION AND PLAN:  #1 paroxysmal atrial fibrillation.  New diagnosis. Although her TSH indicates slight oversupplementation I do not think this has triggered her A-fib. Her magnesium was slightly low but I do not think this triggered her A-fib either. Will order echocardiogram.  Continue Eliquis 5 mg twice a day.  Discontinue aspirin. Referral to A-fib clinic ordered.  #2 hypothyroidism, most recent TSH with me was 2 months ago and was little bit elevated at 6.51. At that time I recommended she take one half of the 125 mcg levothyroxine tabs on 2 days a week and continue 1 tab all other days of the week. Her TSH in the ED a few days ago was a little bit low at 0.07.  I would like to repeat this today.  #3 hypomagnesemia.  Mild.  Replaced in the emergency department. Check magnesium level today.  4.  Chronic renal insufficiency stage III, history of renal transplant. She had nephrology follow-up yesterday and says labs were done but I do not have these available for review today.  #5 vitamin B12 deficiency.  She was not taking her B12 supplement regularly when we checked this (  level 183) 2 months ago. Has been taking this more regularly and would like B12 level checked today--ordered.  An  After Visit Summary was printed and given to the patient.  FOLLOW UP: Return for keep scheduled visit for 4/22.  Signed:  Santiago Bumpers, MD           11/17/2022

## 2022-11-18 LAB — MAGNESIUM: Magnesium: 1.3 mg/dL — ABNORMAL LOW (ref 1.5–2.5)

## 2022-11-18 LAB — VITAMIN B12: Vitamin B-12: 417 pg/mL (ref 211–911)

## 2022-11-18 LAB — TSH: TSH: 0.1 u[IU]/mL — ABNORMAL LOW (ref 0.35–5.50)

## 2022-11-20 ENCOUNTER — Emergency Department (HOSPITAL_COMMUNITY): Payer: Medicare Other

## 2022-11-20 ENCOUNTER — Emergency Department (HOSPITAL_COMMUNITY)
Admission: EM | Admit: 2022-11-20 | Discharge: 2022-11-20 | Disposition: A | Discharge status: Home / Self Care | Payer: Medicare Other | Attending: Emergency Medicine | Admitting: Emergency Medicine

## 2022-11-20 ENCOUNTER — Encounter: Payer: Self-pay | Admitting: Family Medicine

## 2022-11-20 ENCOUNTER — Encounter (HOSPITAL_COMMUNITY): Payer: Self-pay

## 2022-11-20 DIAGNOSIS — R002 Palpitations: Secondary | ICD-10-CM

## 2022-11-20 DIAGNOSIS — Z79899 Other long term (current) drug therapy: Secondary | ICD-10-CM | POA: Diagnosis not present

## 2022-11-20 DIAGNOSIS — Z940 Kidney transplant status: Secondary | ICD-10-CM | POA: Insufficient documentation

## 2022-11-20 DIAGNOSIS — I10 Essential (primary) hypertension: Secondary | ICD-10-CM | POA: Diagnosis not present

## 2022-11-20 DIAGNOSIS — I4891 Unspecified atrial fibrillation: Secondary | ICD-10-CM | POA: Diagnosis not present

## 2022-11-20 DIAGNOSIS — R009 Unspecified abnormalities of heart beat: Secondary | ICD-10-CM | POA: Insufficient documentation

## 2022-11-20 DIAGNOSIS — Z79890 Hormone replacement therapy (postmenopausal): Secondary | ICD-10-CM | POA: Diagnosis not present

## 2022-11-20 DIAGNOSIS — R000 Tachycardia, unspecified: Secondary | ICD-10-CM | POA: Diagnosis not present

## 2022-11-20 LAB — HEPATIC FUNCTION PANEL
ALT: 13 U/L (ref 0–44)
AST: 18 U/L (ref 15–41)
Albumin: 3.4 g/dL — ABNORMAL LOW (ref 3.5–5.0)
Alkaline Phosphatase: 65 U/L (ref 38–126)
Bilirubin, Direct: 0.1 mg/dL (ref 0.0–0.2)
Indirect Bilirubin: 0.5 mg/dL (ref 0.3–0.9)
Total Bilirubin: 0.6 mg/dL (ref 0.3–1.2)
Total Protein: 6.6 g/dL (ref 6.5–8.1)

## 2022-11-20 LAB — BRAIN NATRIURETIC PEPTIDE: B Natriuretic Peptide: 217 pg/mL — ABNORMAL HIGH (ref 0.0–100.0)

## 2022-11-20 LAB — T4, FREE: Free T4: 1.73 ng/dL — ABNORMAL HIGH (ref 0.61–1.12)

## 2022-11-20 LAB — CBC WITH DIFFERENTIAL/PLATELET
Abs Immature Granulocytes: 0.01 10*3/uL (ref 0.00–0.07)
Basophils Absolute: 0 10*3/uL (ref 0.0–0.1)
Basophils Relative: 0 %
Eosinophils Absolute: 0.2 10*3/uL (ref 0.0–0.5)
Eosinophils Relative: 3 %
HCT: 40.8 % (ref 36.0–46.0)
Hemoglobin: 13.3 g/dL (ref 12.0–15.0)
Immature Granulocytes: 0 %
Lymphocytes Relative: 30 %
Lymphs Abs: 2.2 10*3/uL (ref 0.7–4.0)
MCH: 29.6 pg (ref 26.0–34.0)
MCHC: 32.6 g/dL (ref 30.0–36.0)
MCV: 90.7 fL (ref 80.0–100.0)
Monocytes Absolute: 0.5 10*3/uL (ref 0.1–1.0)
Monocytes Relative: 7 %
Neutro Abs: 4.2 10*3/uL (ref 1.7–7.7)
Neutrophils Relative %: 60 %
Platelets: 245 10*3/uL (ref 150–400)
RBC: 4.5 MIL/uL (ref 3.87–5.11)
RDW: 13 % (ref 11.5–15.5)
WBC: 7.1 10*3/uL (ref 4.0–10.5)
nRBC: 0 % (ref 0.0–0.2)

## 2022-11-20 LAB — URINALYSIS, ROUTINE W REFLEX MICROSCOPIC
Bilirubin Urine: NEGATIVE
Glucose, UA: NEGATIVE mg/dL
Hgb urine dipstick: NEGATIVE
Ketones, ur: NEGATIVE mg/dL
Leukocytes,Ua: NEGATIVE
Nitrite: NEGATIVE
Protein, ur: NEGATIVE mg/dL
Specific Gravity, Urine: 1.006 (ref 1.005–1.030)
pH: 7 (ref 5.0–8.0)

## 2022-11-20 LAB — TROPONIN I (HIGH SENSITIVITY)
Troponin I (High Sensitivity): 7 ng/L (ref <18)
Troponin I (High Sensitivity): 6 ng/L (ref <18)

## 2022-11-20 LAB — LIPASE, BLOOD: Lipase: 28 U/L (ref 11–51)

## 2022-11-20 LAB — BASIC METABOLIC PANEL
Anion gap: 7 (ref 5–15)
BUN: 15 mg/dL (ref 8–23)
CO2: 23 mmol/L (ref 22–32)
Calcium: 9.1 mg/dL (ref 8.9–10.3)
Chloride: 107 mmol/L (ref 98–111)
Creatinine, Ser: 1 mg/dL (ref 0.44–1.00)
GFR, Estimated: 58 mL/min — ABNORMAL LOW (ref >60)
Glucose, Bld: 126 mg/dL — ABNORMAL HIGH (ref 70–99)
Potassium: 3.8 mmol/L (ref 3.5–5.1)
Sodium: 137 mmol/L (ref 135–145)

## 2022-11-20 LAB — TSH: TSH: 0.058 u[IU]/mL — ABNORMAL LOW (ref 0.350–4.500)

## 2022-11-20 LAB — MAGNESIUM: Magnesium: 1.5 mg/dL — ABNORMAL LOW (ref 1.7–2.4)

## 2022-11-20 MED ORDER — MAGNESIUM SULFATE 2 GM/50ML IV SOLN
2.0000 g | Freq: Once | INTRAVENOUS | Status: AC
  Administered 2022-11-20: 2 g via INTRAVENOUS
  Filled 2022-11-20: qty 50

## 2022-11-20 NOTE — Discharge Instructions (Addendum)
Today your magnesium was low and this was replaced while in the ER.  Your thyroid hormones were found to be high last week and are likely still high.  Talk to your primary care doctor about results of thyroid hormone testing today and adjustments to your dosing of levothyroxine.  Call the telephone number below if you do not hear from the cardiology office in the next couple days.  Ideally you can obtain follow-up sooner than July.  Return to emergency department in at any time for any new or worsening symptoms of concern.

## 2022-11-20 NOTE — ED Triage Notes (Signed)
Pt arrives today c/o tachycardia ., Pt was dx Wednesday with a fib. Pt states her meter at home read 139 and was concerned.Pt started Eliquis on Friday

## 2022-11-20 NOTE — ED Provider Notes (Signed)
EMERGENCY DEPARTMENT AT [facility] Provider Note   CSN: [id number] Arrival date & time: 11/20/22  1210     History  Chief Complaint  Patient presents with   Tachycardia    Tabitha Green is a 77 y.o. female.  HPI Patient presents for tachycardia.  Medical history includes arthritis, renal transplant, anxiety, GERD, HLD, HTN, SVT and a recent diagnosis of atrial fibrillation.  Prescribed home medications include Xanax, HCTZ, Synthroid, verapamil.  She states that she no longer takes the HCTZ.  She very seldomly takes any Xanax or Ativan and has not in the past several weeks.  Since her recent ED visit, she did decrease her Synthroid dose to 125 mcg daily.  She continues to take her verapamil.  She is not on any other AV nodal agents.  Earlier today, patient experienced palpitations.  When she checked her heart rate at home, it was irregular and elevated with maximum heart rate of 139.  Currently, palpitations have resolved.  She denies any other associated symptoms.    Home Medications Prior to Admission medications   Medication Sig Start Date End Date Taking? Authorizing Provider  AYR SALINE NASAL DROPS NA Place 2 sprays into both nostrils daily.    [provider]  Camphor-Eucalyptus-Menthol (VICKS VAPORUB EX) Apply 1 application topically daily.    [provider]  Cyanocobalamin (B-12 PO) Take by mouth daily.    [provider]  doxazosin (CARDURA) 2 MG tablet Take 1 tablet (2 mg total) by mouth 2 (two) times daily. 11/14/22   McGowen, Maryjean Morn, MD  hydrochlorothiazide (HYDRODIURIL) 25 MG tablet Take 1 tablet (25 mg total) by mouth daily. 08/30/22   McGowen, Maryjean Morn, MD  levothyroxine (SYNTHROID) 125 MCG tablet Take 1 tablet (125 mcg total) by mouth daily. 08/25/22   McGowen, Maryjean Morn, MD  LORazepam (ATIVAN) 0.5 MG tablet Take 1 tablet (0.5 mg total) by mouth 2 (two) times daily as needed for anxiety. 08/12/22   McGowen, Maryjean Morn, MD   losartan (COZAAR) 100 MG tablet Take 1 tablet (100 mg total) by mouth daily. 08/25/22   McGowen, Maryjean Morn, MD  methylPREDNISolone (MEDROL DOSEPAK) 4 MG TBPK tablet Take as directed Patient not taking: Reported on 11/17/2022 11/08/22   Trevor Iha, FNP  mycophenolate (MYFORTIC) 360 MG TBEC EC tablet Take 360 mg by mouth 2 (two) times daily.    [provider]  sodium bicarbonate 650 MG tablet Take 1 tablet (650 mg total) by mouth 2 (two) times daily. Takes 1 in AM and 1 at bedtime 08/25/22   McGowen, Maryjean Morn, MD  tacrolimus (PROGRAF) 1 MG capsule Take 4 mg by mouth 2 (two) times daily. Pt takes 3 in the morning and 3 at night.    [provider]  triamcinolone cream (KENALOG) 0.1 % Apply 1 application topically daily.    [provider]  verapamil (CALAN-SR) 120 MG CR tablet TAKE 1 TABLET BY MOUTH EVERY  AFTERNOON 10/20/22   McGowen, Maryjean Morn, MD  verapamil (CALAN-SR) 240 MG CR tablet Take 1 tablet (240 mg total) by mouth every morning. 08/30/22   McGowen, Maryjean Morn, MD  Vitamin D, Ergocalciferol, (DRISDOL) 1.25 MG (50000 UNIT) CAPS capsule Take 1 capsule (50,000 Units total) by mouth every 7 (seven) days. 09/15/22   McGowen, Maryjean Morn, MD      Allergies    Labetalol, Cefaclor, Naproxen, Enalapril maleate, and Metoprolol tartrate    Review of Systems   Review of Systems  Cardiovascular:  Positive for palpitations.  All other systems reviewed and are negative.   Physical Exam Updated Vital Signs BP (!) 141/85   Pulse 62   Temp 97.7 F (36.5 C) (Oral)   Resp 18   Ht  (1.651 m)   Wt 105.2 kg   SpO2 97%   BMI 38.61 kg/m  Physical Exam Vitals and nursing note reviewed.  Constitutional:      General: She is not in acute distress.    Appearance: Normal appearance. She is well-developed. She is not ill-appearing, toxic-appearing or diaphoretic.  HENT:     Head: Normocephalic and atraumatic.     Right Ear: External ear normal.     Left Ear: External ear  normal.     Nose: Nose normal.     Mouth/Throat:     Mouth: Mucous membranes are moist.  Eyes:     Extraocular Movements: Extraocular movements intact.     Conjunctiva/sclera: Conjunctivae normal.  Cardiovascular:     Rate and Rhythm: Normal rate. Rhythm irregular.  Pulmonary:     Effort: Pulmonary effort is normal. No respiratory distress.  Abdominal:     General: There is no distension.     Palpations: Abdomen is soft.  Musculoskeletal:        General: No swelling. Normal range of motion.     Cervical back: Normal range of motion and neck supple.     Right lower leg: No edema.     Left lower leg: No edema.  Skin:    General: Skin is warm and dry.     Coloration: Skin is not jaundiced or pale.  Neurological:     General: No focal deficit present.     Mental Status: She is alert and oriented to person, place, and time.     Cranial Nerves: No cranial nerve deficit.     Sensory: No sensory deficit.     Motor: No weakness.     Coordination: Coordination normal.  Psychiatric:        Mood and Affect: Mood normal.        Behavior: Behavior normal.        Thought Content: Thought content normal.        Judgment: Judgment normal.     ED Results / Procedures / Treatments   Labs (all labs ordered are listed, but only abnormal results are displayed) Labs Reviewed  BASIC METABOLIC PANEL - Abnormal; Notable for the following components:      Result Value   Glucose, Bld 126 (*)    GFR, Estimated 58 (*)    All other components within normal limits  MAGNESIUM - Abnormal; Notable for the following components:   Magnesium 1.5 (*)    All other components within normal limits  BRAIN NATRIURETIC PEPTIDE - Abnormal; Notable for the following components:   B Natriuretic Peptide 217.0 (*)    All other components within normal limits  URINALYSIS, ROUTINE W REFLEX MICROSCOPIC - Abnormal; Notable for the following components:   Color, Urine STRAW (*)    All other components within normal  limits  TSH - Abnormal; Notable for the following components:   TSH 0.058 (*)    All other components within normal limits  HEPATIC FUNCTION PANEL - Abnormal; Notable for the following components:   Albumin 3.4 (*)    All other components within normal limits  LIPASE, BLOOD  CBC WITH DIFFERENTIAL/PLATELET  T3, FREE  T4, FREE  TROPONIN I (HIGH SENSITIVITY)  TROPONIN I (  HIGH SENSITIVITY)    EKG EKG Interpretation  Date/Time:  Sunday November 20 2022 12:19:40 EDT Ventricular Rate:  107 PR Interval:    QRS Duration: 95 QT Interval:  350 QTC Calculation: 467 R Axis:   -14 Text Interpretation: Atrial fibrillation Abnormal R-wave progression, early transition Minimal ST depression, diffuse leads Confirmed by Gloris Manchester [phone]) on 11/20/2022 1:27:06 PM  Radiology DG Chest Portable 1 View  Result Date: 11/20/2022 CLINICAL DATA:  Tachycardia in the 130 spirits EXAM: PORTABLE CHEST 1 VIEW COMPARISON:  08/02/2022 FINDINGS: Stable cardiomediastinal contours. Aortic tortuosity. No pleural fluid or airspace disease. Visualized osseous structures are unremarkable. IMPRESSION: No active disease. Electronically Signed   By: Signa Kell M.D.   On: 11/20/2022 13:48    Procedures Procedures    Medications Ordered in ED Medications  magnesium sulfate IVPB 2 g 50 mL (2 g Intravenous New Bag/Given 11/20/22 1532)    ED Course/ Medical Decision Making/ A&P                             Medical Decision Making Amount and/or Complexity of Data Reviewed Labs: ordered. Radiology: ordered.  Risk Prescription drug management.   This patient presents to the ED for concern of palpitations, this involves an extensive number of treatment options, and is a complaint that carries with it a high risk of complications and morbidity.  The differential diagnosis includes fibrillation with RVR, other arrhythmia, anxiety   Co morbidities that complicate the patient evaluation  arthritis, renal transplant,  anxiety, GERD, HLD, HTN, SVT, atrial fibrillation   Additional history obtained:  Additional history obtained from patient's husband External records from outside source obtained and reviewed including EMR   Lab Tests:  I Ordered, and personally interpreted labs.  The pertinent results include: Hypomagnesemia with otherwise normal electrolytes, normal hemoglobin, no leukocytosis, normal troponin, low TSH suggestive of hyperthyroidism.   Imaging Studies ordered:  I ordered imaging studies including chest x-ray I independently visualized and interpreted imaging which showed no acute findings I agree with the radiologist interpretation   Cardiac Monitoring: / EKG:  The patient was maintained on a cardiac monitor.  I personally viewed and interpreted the cardiac monitored which showed an underlying rhythm of: Initially atrial fibrillation, converted to sinus rhythm  Problem List / ED Course / Critical interventions / Medication management  Patient presents for palpitations and elevated heart rate at home.  She had a recent diagnosis of atrial fibrillation.  Heart rhythm on arrival is atrial fibrillation.  Currently, rate is controlled in the 90s.  Palpitations have resolved.  Patient is well-appearing on exam.  Her breathing is unlabored.  Laboratory workup was initiated to assess for electrolytes and thyroid levels.  On bedside monitor, patient subsequently converted to sinus rhythm.  She would have brief episodes of atrial fibrillation but had sustained sinus rhythm for several hours while in the ED.  When she did convert to sinus rhythm, heart rate was in the low range of normal (50s to 60s).  Blood pressure remained normal.  Given her low heart rate when converted to sinus rhythm, will defer initiation of any further AV nodal agents.  Her magnesium was found to be low and replacement was given in the ED.  TSH was quite low.  This is expected after recent lab work showing the same.  Patient  was advised to follow-up on results of T3 and T4 and to discuss Synthroid dosing with her primary  care doctor.  Patient remained asymptomatic while in the ED.  She was discharged in stable condition. I ordered medication including magnesium sulfate for hypomagnesemia Reevaluation of the patient after these medicines showed that the patient improved I have reviewed the patients home medicines and have made adjustments as needed   Social Determinants of Health:  Has PCP         Final Clinical Impression(s) / ED Diagnoses Final diagnoses:  Palpitations    Rx / DC Orders ED Discharge Orders          Ordered    Ambulatory referral to Cardiology       Comments: If you have not heard from the Cardiology office within the next 72 hours please call [phone].   11/20/22 1727              Gloris Manchester, MD 11/20/22 1729

## 2022-11-21 ENCOUNTER — Ambulatory Visit (INDEPENDENT_AMBULATORY_CARE_PROVIDER_SITE_OTHER): Payer: Medicare Other | Admitting: Family Medicine

## 2022-11-21 ENCOUNTER — Encounter: Payer: Self-pay | Admitting: Family Medicine

## 2022-11-21 VITALS — BP 111/75 | HR 112 | Ht 65.0 in | Wt 231.0 lb

## 2022-11-21 DIAGNOSIS — I480 Paroxysmal atrial fibrillation: Secondary | ICD-10-CM | POA: Diagnosis not present

## 2022-11-21 DIAGNOSIS — E039 Hypothyroidism, unspecified: Secondary | ICD-10-CM

## 2022-11-21 LAB — MAGNESIUM: Magnesium: 1.4 mg/dL — ABNORMAL LOW (ref 1.5–2.5)

## 2022-11-21 NOTE — Telephone Encounter (Signed)
noted 

## 2022-11-21 NOTE — Patient Instructions (Signed)
Don't take your thyroid medication today, tomorrow, or Wednesday. On Thursday, start taking 1/2 tab on 2 days a week and whole tab all other days.

## 2022-11-21 NOTE — Progress Notes (Signed)
OFFICE VISIT  11/21/2022  CC:  Chief Complaint  Patient presents with   Follow-up    ED 11/20/2022 Low magnesium  Tachardia     Patient is a 77 y.o. female who presents for follow-up ED visit on yesterday for palpitations.   A/P as of last visit with me on 11/17/2022: "#1 paroxysmal atrial fibrillation.  New diagnosis. Although her TSH indicates slight oversupplementation I do not think this has triggered her A-fib. Her magnesium was slightly low but I do not think this triggered her A-fib either. Will order echocardiogram.  Continue Eliquis 5 mg twice a day.  Discontinue aspirin. Referral to A-fib clinic ordered.   #2 hypothyroidism, most recent TSH with me was 2 months ago and was little bit elevated at 6.51. At that time I recommended she take one half of the 125 mcg levothyroxine tabs on 2 days a week and continue 1 tab all other days of the week. Her TSH in the ED a few days ago was a little bit low at 0.07.  I would like to repeat this today.   #3 hypomagnesemia.  Mild.  Replaced in the emergency department. Check magnesium level today.   4.  Chronic renal insufficiency stage III, history of renal transplant. She had nephrology follow-up yesterday and says labs were done but I do not have these available for review today.   #5 vitamin B12 deficiency.  She was not taking her B12 supplement regularly when we checked this (level 183) 2 months ago. Has been taking this more regularly and would like B12 level checked today--ordered."  INTERIM HX: Tabitha Green had palpitations yesterday and went to the emergency department.  She was found to be in A-fib with heart rate in the 90s.  She converted spontaneously while in the emergency department and remained in normal rhythm for several hours while there.  In sinus rhythm her heart rate was in the 50s to 60s so no further AV nodal agents were added to her verapamil.  Labs showed a magnesium of 1.5.  TSH was low, as expected.  Free T4 result  pending at the time of discharge.  Troponin negative.  Chest x-ray normal. She was not sent home on any new medication.  UPDATE: Feeling very anxious.  Feels her heart rate go up sometimes but nothing extreme.  She checks her heart rate a couple times a day and it is usually in the low 100s. She is not taking any of her lorazepam.  No dizziness, chest pain, shortness of breath, nausea, or lower extremity swelling.  Past Medical History:  Diagnosis Date   Anxiety    Arthritis    Cholelithiasis 04/2021   noted on CT abd/pelv done for R lower abd swelling and MR abd   Chronic renal insufficiency, stage III (moderate) 10/2015   Post-transplant baseline sCr 1.2-1.4.     CMV infection summer 2017   Valcyte per ID/Renal transplant team   Depression    Diverticulosis    a. 05/2012 colonoscopy   Erosive lichen planus of vulva    Topical steroids (managed by Dr. Robby [facility].   ESRD (end stage renal disease)    began dialysis 2014--followed by Dr. Lowell Guitar.  Received deceased donor kidney transplant 10/2015.   Fatty liver 10/2021   with hepatomegaly   FSGS (focal segmental glomerulosclerosis)    right kidney; hx of left renal cell cancer and got nephrectomy 1993.   GERD (gastroesophageal reflux disease)  Gout    Heart murmur    (Diastolic) ECHO 08/2013 showed that this murmur is coming from pt's R arm AV fistula   Hiatal hernia    History of renal cell cancer 1993   Left nephrectomy   Hyperlipidemia    Hypertension    Hypothyroidism    Impingement syndrome of left shoulder 04/2016   Dr. Bertram Savin to PT   Kidney cancer, primary, with metastasis from kidney to other site    Lumbar spinal stenosis    Chronic LBP with bilat neurogenic claudication   Metatarsal fracture, pathologic 01/2018   Right; orthocarolina-->post op shoe continued, f/u x-ray planned.   Obesity    Osteoarthritis of left knee 08/2017   severe, diffuse,  tricompartmental.  Responded to steroid injection.     Osteoporosis 06/07/2016   DEXA T-score of -3.1.  Fosamax planned 2017 but dental work prevented start..  Pathological toe fx 12/2017--repeat DEXA 08/2018, T-score -3.3 radius. 12/2020 T score radius -3.7.   Pneumonia    Renal transplant recipient November 26, 2015   Baseline Cr as of 09/2020 1.2-1.4   Simple hepatic cyst    2022   Solitary kidney, acquired    SVT (supraventricular tachycardia)    UTI (urinary tract infection)    Vitamin B12 deficiency 12/2018   Starting replacement as of 12/11/2018    Past Surgical History:  Procedure Laterality Date   AV FISTULA PLACEMENT Right    Right arm: aneurismal dilatation 2017 being followed by CV surgeons   CARDIOVASCULAR STRESS TEST     03/10/15 ETT ([facility]): Exercise ECG negative at 83% max predicted HR.    CATARACT EXTRACTION     COLONOSCOPY  2003; 05/2012   diverticulosis, no polyps.  Recall 10 yrs   colonoscopy with polypectomy     Dr Marina Goodell   DEXA  06/07/2016; 08/22/2018; 12/30/20   2017 T-score -3.1.  2020 T score -3.3.  2022 T score -3.7   HYSTERECTOMY ABDOMINAL WITH SALPINGECTOMY     KIDNEY TRANSPLANT  11/26/2015   Deceased donor kidney transplant, with Thymoglobulin induction   LIGATION OF ARTERIOVENOUS  FISTULA Right 02/14/2017   Procedure: LIGATION OF ARTERIOVENOUS  FISTULA;  Surgeon: Chuck Hint, MD;  Location: [facility] OR;  Service: Vascular;  Laterality: Right;   NEPHRECTOMY  1993   for malignancy- left   RENAL BIOPSY     right   RESECTION OF ARTERIOVENOUS FISTULA ANEURYSM Right 02/14/2017   Procedure: EXCISION OF RIGHT BRACHIOCEPHALIC ARTERIOVENOUS FISTULA ANEURYSM;  Surgeon: Chuck Hint, MD;  Location: [facility] OR;  Service: Vascular;  Laterality: Right;   TEE WITHOUT CARDIOVERSION N/A 08/27/2013   Procedure: TRANSESOPHAGEAL ECHOCARDIOGRAM (TEE);  Surgeon: Wendall Stade, MD;  Location: [facility] ENDOSCOPY;  Service: Cardiovascular;  Laterality: N/A;   TOTAL ABDOMINAL  HYSTERECTOMY W/ BILATERAL SALPINGOOPHORECTOMY  1997   fibroids   TRANSTHORACIC ECHOCARDIOGRAM     03/10/15 echo ([facility]): LV cavity normal in size, focal basal hypertrophy, normal systolic function, EF 55% (visual est). Normal wall motion, no regional wall motion abnormalities. Mild diastolic dysfunction with normal LA chamber size. No significant valve stenosis or regurgitation.   VULVA / PERINEUM BIOPSY  2015    Outpatient Medications Prior to Visit  Medication Sig Dispense Refill   apixaban (ELIQUIS) 5 MG TABS tablet Take 5 mg by mouth 2 (two) times daily.     AYR SALINE NASAL DROPS NA Place 2 sprays into both nostrils daily.     Camphor-Eucalyptus-Menthol (VICKS VAPORUB EX) Apply  1 application topically daily.     Cyanocobalamin (B-12 PO) Take by mouth daily.     doxazosin (CARDURA) 2 MG tablet Take 1 tablet (2 mg total) by mouth 2 (two) times daily. 180 tablet 0   hydrochlorothiazide (HYDRODIURIL) 25 MG tablet Take 1 tablet (25 mg total) by mouth daily. 90 tablet 3   levothyroxine (SYNTHROID) 125 MCG tablet Take 1 tablet (125 mcg total) by mouth daily. 90 tablet 3   LORazepam (ATIVAN) 0.5 MG tablet Take 1 tablet (0.5 mg total) by mouth 2 (two) times daily as needed for anxiety. 30 tablet 1   losartan (COZAAR) 100 MG tablet Take 1 tablet (100 mg total) by mouth daily. 90 tablet 3   mycophenolate (MYFORTIC) 360 MG TBEC EC tablet Take 360 mg by mouth 2 (two) times daily.     sodium bicarbonate 650 MG tablet Take 1 tablet (650 mg total) by mouth 2 (two) times daily. Takes 1 in AM and 1 at bedtime 180 tablet 3   tacrolimus (PROGRAF) 1 MG capsule Take 4 mg by mouth 2 (two) times daily. Pt takes 3 in the morning and 3 at night.     triamcinolone cream (KENALOG) 0.1 % Apply 1 application topically daily.     verapamil (CALAN-SR) 120 MG CR tablet TAKE 1 TABLET BY MOUTH EVERY  AFTERNOON 90 tablet 2   verapamil (CALAN-SR) 240 MG CR tablet Take 1 tablet (240 mg total) by mouth  every morning. 90 tablet 3   Vitamin D, Ergocalciferol, (DRISDOL) 1.25 MG (50000 UNIT) CAPS capsule Take 1 capsule (50,000 Units total) by mouth every 7 (seven) days. 12 capsule 1   methylPREDNISolone (MEDROL DOSEPAK) 4 MG TBPK tablet Take as directed (Patient not taking: Reported on 11/17/2022) 1 each 0   No facility-administered medications prior to visit.    Allergies  Allergen Reactions   Labetalol     Other reaction(s): Other   Cefaclor Rash   Naproxen Rash   Enalapril Maleate Cough    Vasotec   Metoprolol Tartrate Rash    On legs    Review of Systems As per HPI  PE:    11/21/2022   11:17 AM 11/20/2022    5:45 PM 11/20/2022    5:00 PM  Vitals with BMI  Height 5\' 5"     Weight 231 lbs    BMI 38.44    Systolic 111 169 170  Diastolic 75 93 85  Pulse 112 57 62     Physical Exam  Gen: Alert, well appearing.  Patient is oriented to person, place, time, and situation. AFFECT: Very anxious. lucid thought and speech. Cardiovascular: Regular rhythm, rate 110, no murmur. Lungs are clear bilaterally, breathing is nonlabored. Extremities show no edema.  LABS:  Last CBC Lab Results  Component Value Date   WBC 7.1 11/20/2022   HGB 13.3 11/20/2022   HCT 40.8 11/20/2022   MCV 90.7 11/20/2022   MCH 29.6 11/20/2022   RDW 13.0 11/20/2022   PLT 245 11/20/2022   Last metabolic panel Lab Results  Component Value Date   GLUCOSE 126 (H) 11/20/2022   NA 137 11/20/2022   K 3.8 11/20/2022   CL 107 11/20/2022   CO2 23 11/20/2022   BUN 15 11/20/2022   CREATININE 1.00 11/20/2022   GFRNONAA 58 (L) 11/20/2022   CALCIUM 9.1 11/20/2022   PHOS 2.9 01/04/2018   PROT 6.6 11/20/2022   ALBUMIN 3.4 (L) 11/20/2022   BILITOT 0.6 11/20/2022   ALKPHOS 65 11/20/2022  AST 18 11/20/2022   ALT 13 11/20/2022   ANIONGAP 7 11/20/2022   Last lipids Lab Results  Component Value Date   CHOL 148 09/13/2022   HDL 41.30 09/13/2022   LDLCALC 79 09/13/2022   LDLDIRECT 166.3 10/02/2007    TRIG 140.0 09/13/2022   CHOLHDL 4 09/13/2022   Last hemoglobin A1c Lab Results  Component Value Date   HGBA1C 5.4 02/23/2012   Last thyroid functions Lab Results  Component Value Date   TSH 0.058 (L) 11/20/2022   BNP (last 3 results) Recent Labs    11/20/22 1317  BNP 217.0*   IMPRESSION AND PLAN:  #1 paroxysmal atrial fibrillation. She has some tachycardia but I cannot see that it is truly making her symptomatic.  She has a very high amount of anxiety. Plan is to continue verapamil SR 240 mg every morning and 120 mg every afternoon. Continue Eliquis 5 mg twice a day. She will be make an appointment with A-fib clinic soon. She should be getting a call anytime out to schedule an echocardiogram.  2.  Acquired hypothyroidism, most recent TSH indicated oversupplementation. She is holding her levothyroxine for the next 3 days and then she will start taking one half of 125 mcg tab on 2 days a week and a whole tab on this.  #3 hypomagnesemia: Minimally low, hard to tell how clinically significant this problem is for her. Recheck level today. An After Visit Summary was printed and given to the patient.  FOLLOW UP: No follow-ups on file.  Signed:  Santiago Bumpers, MD           11/21/2022

## 2022-11-22 ENCOUNTER — Encounter: Payer: Self-pay | Admitting: Family Medicine

## 2022-11-22 ENCOUNTER — Telehealth: Payer: Self-pay

## 2022-11-22 ENCOUNTER — Other Ambulatory Visit: Payer: Self-pay | Admitting: Family Medicine

## 2022-11-22 LAB — T3, FREE: T3, Free: 3.1 pg/mL (ref 2.0–4.4)

## 2022-11-22 MED ORDER — MAGNESIUM CHLORIDE 64 MG PO TBEC: 2.0000 | DELAYED_RELEASE_TABLET | Freq: Every day | ORAL | 1 refills | Status: DC

## 2022-11-22 NOTE — Telephone Encounter (Signed)
     Patient  visit on 4/14  at [facility]   Have you been able to follow up with your primary care physician? Yes   The patient was or was not able to obtain any needed medicine or equipment. Na   Are there diet recommendations that you are having difficulty following? Na   Patient expresses understanding of discharge instructions and education provided has no other needs at this time.  Yes      Lenard Forth [facility] Guide, [facility] [phone] [address] [place], [place], [place] [address] Phone: [phone] Email: [email or web address]

## 2022-11-23 DIAGNOSIS — N2581 Secondary hyperparathyroidism of renal origin: Secondary | ICD-10-CM | POA: Diagnosis not present

## 2022-11-23 DIAGNOSIS — K469 Unspecified abdominal hernia without obstruction or gangrene: Secondary | ICD-10-CM | POA: Diagnosis not present

## 2022-11-23 DIAGNOSIS — Z940 Kidney transplant status: Secondary | ICD-10-CM | POA: Diagnosis not present

## 2022-11-23 DIAGNOSIS — I4891 Unspecified atrial fibrillation: Secondary | ICD-10-CM | POA: Diagnosis not present

## 2022-11-23 DIAGNOSIS — N051 Unspecified nephritic syndrome with focal and segmental glomerular lesions: Secondary | ICD-10-CM | POA: Diagnosis not present

## 2022-11-23 DIAGNOSIS — K760 Fatty (change of) liver, not elsewhere classified: Secondary | ICD-10-CM | POA: Diagnosis not present

## 2022-11-23 DIAGNOSIS — Z79899 Other long term (current) drug therapy: Secondary | ICD-10-CM | POA: Diagnosis not present

## 2022-11-23 DIAGNOSIS — K769 Liver disease, unspecified: Secondary | ICD-10-CM | POA: Diagnosis not present

## 2022-11-23 DIAGNOSIS — D849 Immunodeficiency, unspecified: Secondary | ICD-10-CM | POA: Diagnosis not present

## 2022-11-23 DIAGNOSIS — B259 Cytomegaloviral disease, unspecified: Secondary | ICD-10-CM | POA: Diagnosis not present

## 2022-11-23 DIAGNOSIS — I10 Essential (primary) hypertension: Secondary | ICD-10-CM | POA: Diagnosis not present

## 2022-11-24 ENCOUNTER — Encounter: Payer: Self-pay | Admitting: Family Medicine

## 2022-11-24 ENCOUNTER — Ambulatory Visit (INDEPENDENT_AMBULATORY_CARE_PROVIDER_SITE_OTHER): Payer: Medicare Other | Admitting: Family Medicine

## 2022-11-24 VITALS — BP 109/69 | HR 65 | Temp 98.1°F | Ht 65.0 in | Wt 230.2 lb

## 2022-11-24 DIAGNOSIS — K625 Hemorrhage of anus and rectum: Secondary | ICD-10-CM

## 2022-11-24 DIAGNOSIS — Z7901 Long term (current) use of anticoagulants: Secondary | ICD-10-CM

## 2022-11-24 DIAGNOSIS — Z8719 Personal history of other diseases of the digestive system: Secondary | ICD-10-CM

## 2022-11-24 NOTE — Telephone Encounter (Signed)
Looks like I do not have a patient between 140 and 240 today.  Can we get her in?

## 2022-11-24 NOTE — Progress Notes (Signed)
OFFICE VISIT  11/24/2022  CC:  Chief Complaint  Patient presents with   Blood In Stools    Hx of hemorrhoids, first noticed last Thurs. She does mention some straining last week. Denies back or abdominal pain.     Patient is a 77 y.o. female who presents for blood in stool.  HPI: Tabitha Green noted bright red blood on her stool this morning.  Says it did not look like a lot.  She thought it was probably hemorrhoidal bleeding. However, she was concerned because she just started Eliquis 4 days ago for stroke prophylaxis for new diagnosis A-fib. No blood in urine.  She is in no pain. Her bowel movements are not very frequent but she says the stool is soft and she does not have a whole lot of trouble with straining or difficulty passing stools. She says she had does have a history of internal hemorrhoids that have given similar bleeding in the remote past.  Past Medical History:  Diagnosis Date   Anxiety    Arthritis    Cholelithiasis 04/2021   noted on CT abd/pelv done for R lower abd swelling and MR abd   Chronic renal insufficiency, stage III (moderate) 10/2015   Post-transplant baseline sCr 1.2-1.4.     CMV infection summer 2017   Valcyte per ID/Renal transplant team   Depression    Diverticulosis    a. 05/2012 colonoscopy   Erosive lichen planus of vulva    Topical steroids (managed by Dr. Robby [facility].   ESRD (end stage renal disease)    began dialysis 2014--followed by Dr. Lowell Guitar.  Received deceased donor kidney transplant 10/2015.   Fatty liver 10/2021   with hepatomegaly   FSGS (focal segmental glomerulosclerosis)    right kidney; hx of left renal cell cancer and got nephrectomy 1993.   GERD (gastroesophageal reflux disease)    Gout    Heart murmur    (Diastolic) ECHO 08/2013 showed that this murmur is coming from pt's R arm AV fistula   Hiatal hernia    History of renal cell cancer 1993   Left nephrectomy   Hyperlipidemia     Hypertension    Hypothyroidism    Impingement syndrome of left shoulder 04/2016   Dr. Bertram Savin to PT   Kidney cancer, primary, with metastasis from kidney to other site    Lumbar spinal stenosis    Chronic LBP with bilat neurogenic claudication   Metatarsal fracture, pathologic 01/2018   Right; orthocarolina-->post op shoe continued, f/u x-ray planned.   Obesity    Osteoarthritis of left knee 08/2017   severe, diffuse, tricompartmental.  Responded to steroid injection.     Osteoporosis 06/07/2016   DEXA T-score of -3.1.  Fosamax planned 2017 but dental work prevented start..  Pathological toe fx 12/2017--repeat DEXA 08/2018, T-score -3.3 radius. 12/2020 T score radius -3.7.   Pneumonia    Renal transplant recipient 11/06/2015   Baseline Cr as of 09/2020 1.2-1.4   Simple hepatic cyst    2022   Solitary kidney, acquired    SVT (supraventricular tachycardia)    UTI (urinary tract infection)    Vitamin B12 deficiency 12/2018   Starting replacement as of 12/11/2018    Past Surgical History:  Procedure Laterality Date   AV FISTULA PLACEMENT Right    Right arm: aneurismal dilatation 2017 being followed by CV surgeons   CARDIOVASCULAR STRESS TEST     03/10/15 ETT ([facility]): Exercise ECG negative at  83% max predicted HR.    CATARACT EXTRACTION     COLONOSCOPY  2003; 05/2012   diverticulosis, no polyps.  Recall 10 yrs   colonoscopy with polypectomy     Dr Marina Goodell   DEXA  06/07/2016; 08/22/2018; 12/30/20   2017 T-score -3.1.  2020 T score -3.3.  2022 T score -3.7   HYSTERECTOMY ABDOMINAL WITH SALPINGECTOMY     KIDNEY TRANSPLANT  11-22-15   Deceased donor kidney transplant, with Thymoglobulin induction   LIGATION OF ARTERIOVENOUS  FISTULA Right 02/14/2017   Procedure: LIGATION OF ARTERIOVENOUS  FISTULA;  Surgeon: Chuck Hint, MD;  Location: [facility] OR;  Service: Vascular;  Laterality: Right;   NEPHRECTOMY  1993   for malignancy- left   RENAL BIOPSY     right    RESECTION OF ARTERIOVENOUS FISTULA ANEURYSM Right 02/14/2017   Procedure: EXCISION OF RIGHT BRACHIOCEPHALIC ARTERIOVENOUS FISTULA ANEURYSM;  Surgeon: Chuck Hint, MD;  Location: [facility] OR;  Service: Vascular;  Laterality: Right;   TEE WITHOUT CARDIOVERSION N/A 08/27/2013   Procedure: TRANSESOPHAGEAL ECHOCARDIOGRAM (TEE);  Surgeon: Wendall Stade, MD;  Location: [facility] ENDOSCOPY;  Service: Cardiovascular;  Laterality: N/A;   TOTAL ABDOMINAL HYSTERECTOMY W/ BILATERAL SALPINGOOPHORECTOMY  1997   fibroids   TRANSTHORACIC ECHOCARDIOGRAM     03/10/15 echo ([facility]): LV cavity normal in size, focal basal hypertrophy, normal systolic function, EF 55% (visual est). Normal wall motion, no regional wall motion abnormalities. Mild diastolic dysfunction with normal LA chamber size. No significant valve stenosis or regurgitation.   VULVA / PERINEUM BIOPSY  2015    Outpatient Medications Prior to Visit  Medication Sig Dispense Refill   apixaban (ELIQUIS) 5 MG TABS tablet Take 5 mg by mouth 2 (two) times daily.     AYR SALINE NASAL DROPS NA Place 2 sprays into both nostrils daily.     Camphor-Eucalyptus-Menthol (VICKS VAPORUB EX) Apply 1 application topically daily.     Cyanocobalamin (B-12 PO) Take by mouth daily.     doxazosin (CARDURA) 2 MG tablet Take 1 tablet (2 mg total) by mouth 2 (two) times daily. 180 tablet 0   hydrochlorothiazide (HYDRODIURIL) 25 MG tablet Take 1 tablet (25 mg total) by mouth daily. 90 tablet 3   levothyroxine (SYNTHROID) 125 MCG tablet Take 1 tablet (125 mcg total) by mouth daily. 90 tablet 3   LORazepam (ATIVAN) 0.5 MG tablet Take 1 tablet (0.5 mg total) by mouth 2 (two) times daily as needed for anxiety. 30 tablet 1   losartan (COZAAR) 100 MG tablet Take 1 tablet (100 mg total) by mouth daily. 90 tablet 3   magnesium chloride (SLOW-MAG) 64 MG TBEC SR tablet Take 2 tablets (128 mg total) by mouth daily. 60 tablet 1   mycophenolate (MYFORTIC) 360 MG TBEC EC  tablet Take 360 mg by mouth 2 (two) times daily.     sodium bicarbonate 650 MG tablet Take 1 tablet (650 mg total) by mouth 2 (two) times daily. Takes 1 in AM and 1 at bedtime 180 tablet 3   tacrolimus (PROGRAF) 1 MG capsule Take 4 mg by mouth 2 (two) times daily.     triamcinolone cream (KENALOG) 0.1 % Apply 1 application topically daily.     verapamil (CALAN-SR) 120 MG CR tablet TAKE 1 TABLET BY MOUTH EVERY  AFTERNOON 90 tablet 2   verapamil (CALAN-SR) 240 MG CR tablet Take 1 tablet (240 mg total) by mouth every morning. 90 tablet 3   Vitamin D, Ergocalciferol, (DRISDOL)  1.25 MG (50000 UNIT) CAPS capsule Take 1 capsule (50,000 Units total) by mouth every 7 (seven) days. 12 capsule 1   methylPREDNISolone (MEDROL DOSEPAK) 4 MG TBPK tablet Take as directed (Patient not taking: Reported on 11/17/2022) 1 each 0   No facility-administered medications prior to visit.    Allergies  Allergen Reactions   Labetalol     Other reaction(s): Other   Cefaclor Rash   Naproxen Rash   Enalapril Maleate Cough    Vasotec   Metoprolol Tartrate Rash    On legs    Review of Systems  As per HPI  PE:    11/24/2022    1:58 PM 11/21/2022   11:17 AM 11/20/2022    5:45 PM  Vitals with BMI  Height 5\' 5"  5\' 5"    Weight 230 lbs 3 oz 231 lbs   BMI 38.31 38.44   Systolic 109 111 952  Diastolic 69 75 93  Pulse 65 112 57    Physical Exam Exam chaperoned by Sammuel Cooper, CMA  Gen: Alert, well appearing.  Patient is oriented to person, place, time, and situation. AFFECT: pleasant, lucid thought and speech. Rectal exam: anal exam shows no rash, hemorrhoids, or fissures. Rectal shows no mass, lesions or tenderness.    LABS:  Last CBC Lab Results  Component Value Date   WBC 7.1 11/20/2022   HGB 13.3 11/20/2022   HCT 40.8 11/20/2022   MCV 90.7 11/20/2022   MCH 29.6 11/20/2022   RDW 13.0 11/20/2022   PLT 245 11/20/2022   Last metabolic panel Lab Results  Component Value Date   GLUCOSE 126 (H)  11/20/2022   NA 137 11/20/2022   K 3.8 11/20/2022   CL 107 11/20/2022   CO2 23 11/20/2022   BUN 15 11/20/2022   CREATININE 1.00 11/20/2022   GFRNONAA 58 (L) 11/20/2022   CALCIUM 9.1 11/20/2022   PHOS 2.9 01/04/2018   PROT 6.6 11/20/2022   ALBUMIN 3.4 (L) 11/20/2022   BILITOT 0.6 11/20/2022   ALKPHOS 65 11/20/2022   AST 18 11/20/2022   ALT 13 11/20/2022   ANIONGAP 7 11/20/2022   Lab Results  Component Value Date   TSH 0.058 (L) 11/20/2022   IMPRESSION AND PLAN:  #1 bright red blood per rectum.  This is in the setting of recently starting Eliquis. Most likely internal hemorrhoidal bleeding but she is a little overdue for her 10-year repeat screening colonoscopy. Will make referral to GI. At this point it is okay to continue Eliquis 5 mg twice a day. Monitor for bleeding and call if worried at all.  She knows to stop Eliquis if significant bleeding occurs.  An After Visit Summary was printed and given to the patient.  FOLLOW UP: Return if symptoms worsen or fail to improve.  Signed:  Santiago Bumpers, MD           11/24/2022

## 2022-11-24 NOTE — Telephone Encounter (Signed)
Spoke with pt and offered 2p in office evaluation, pt agreed. Appt scheduled

## 2022-11-28 ENCOUNTER — Ambulatory Visit (INDEPENDENT_AMBULATORY_CARE_PROVIDER_SITE_OTHER): Payer: Medicare Other | Admitting: Family Medicine

## 2022-11-28 ENCOUNTER — Encounter: Payer: Self-pay | Admitting: Family Medicine

## 2022-11-28 ENCOUNTER — Telehealth: Payer: Self-pay

## 2022-11-28 DIAGNOSIS — I10 Essential (primary) hypertension: Secondary | ICD-10-CM | POA: Diagnosis not present

## 2022-11-28 DIAGNOSIS — I4891 Unspecified atrial fibrillation: Secondary | ICD-10-CM

## 2022-11-28 DIAGNOSIS — R7989 Other specified abnormal findings of blood chemistry: Secondary | ICD-10-CM | POA: Diagnosis not present

## 2022-11-28 DIAGNOSIS — E039 Hypothyroidism, unspecified: Secondary | ICD-10-CM

## 2022-11-28 DIAGNOSIS — I480 Paroxysmal atrial fibrillation: Secondary | ICD-10-CM

## 2022-11-28 LAB — MAGNESIUM: Magnesium: 1.3 mg/dL — ABNORMAL LOW (ref 1.5–2.5)

## 2022-11-28 MED ORDER — LEVOTHYROXINE SODIUM 125 MCG PO TABS: ORAL_TABLET | ORAL | 3 refills | Status: DC

## 2022-11-28 NOTE — Progress Notes (Signed)
OFFICE VISIT  11/28/2022  CC:  Chief Complaint  Patient presents with   Anxiety    Follow up    Patient is a 77 y.o. female who presents for follow-up hypertension, chronic renal insufficiency, and anxiety.  INTERIM HX: I saw her 11/24/2022 for BRBPR. Has not had any further significant BRBPR.  Has 12/05/21 appt with a-fib clinic through [facility] in [place]. She says she would rather be established in [place].  Denies any palpitations or racing heart.  She checks heart rate at home some and the highest has been 110. No dizziness or chest pain or shortness of breath. She has used Ativan for anxiety a couple times lately  Past Medical History:  Diagnosis Date   Anxiety    Arthritis    Cholelithiasis 04/2021   noted on CT abd/pelv done for R lower abd swelling and MR abd   Chronic renal insufficiency, stage III (moderate) 10/2015   Post-transplant baseline sCr 1.2-1.4.     CMV infection summer 2017   Valcyte per ID/Renal transplant team   Depression    Diverticulosis    a. 05/2012 colonoscopy   Erosive lichen planus of vulva    Topical steroids (managed by Dr. Robby Sermon [facility] via [facility].   ESRD (end stage renal disease)    began dialysis 2014--followed by Dr. Lowell Guitar.  Received deceased donor kidney transplant 10/2015.   Fatty liver 10/2021   with hepatomegaly   FSGS (focal segmental glomerulosclerosis)    right kidney; hx of left renal cell cancer and got nephrectomy 1993.   GERD (gastroesophageal reflux disease)    Gout    Heart murmur    (Diastolic) ECHO 08/2013 showed that this murmur is coming from pt's R arm AV fistula   Hiatal hernia    History of renal cell cancer 1993   Left nephrectomy   Hyperlipidemia    Hypertension    Hypothyroidism    Impingement syndrome of left shoulder 04/2016   Dr. Bertram Savin to PT   Kidney cancer, primary, with metastasis from kidney to other site    Lumbar spinal stenosis    Chronic  LBP with bilat neurogenic claudication   Metatarsal fracture, pathologic 01/2018   Right; orthocarolina-->post op shoe continued, f/u x-ray planned.   Obesity    Osteoarthritis of left knee 08/2017   severe, diffuse, tricompartmental.  Responded to steroid injection.     Osteoporosis 06/07/2016   DEXA T-score of -3.1.  Fosamax planned 2017 but dental work prevented start..  Pathological toe fx 12/2017--repeat DEXA 08/2018, T-score -3.3 radius. 12/2020 T score radius -3.7.   Pneumonia    Renal transplant recipient 11/06/2015   Baseline Cr as of 09/2020 1.2-1.4   Simple hepatic cyst    2022   Solitary kidney, acquired    SVT (supraventricular tachycardia)    UTI (urinary tract infection)    Vitamin B12 deficiency 12/2018   Starting replacement as of 12/11/2018    Past Surgical History:  Procedure Laterality Date   AV FISTULA PLACEMENT Right    Right arm: aneurismal dilatation 2017 being followed by CV surgeons   CARDIOVASCULAR STRESS TEST     03/10/15 ETT ([facility]): Exercise ECG negative at 83% max predicted HR.    CATARACT EXTRACTION     COLONOSCOPY  2003; 05/2012   diverticulosis, no polyps.  Recall 10 yrs   colonoscopy with polypectomy     Dr Marina Goodell   DEXA  06/07/2016; 08/22/2018; 12/30/20   2017 T-score -  3.1.  2020 T score -3.3.  2022 T score -3.7   HYSTERECTOMY ABDOMINAL WITH SALPINGECTOMY     KIDNEY TRANSPLANT  November 17, 2015   Deceased donor kidney transplant, with Thymoglobulin induction   LIGATION OF ARTERIOVENOUS  FISTULA Right 02/14/2017   Procedure: LIGATION OF ARTERIOVENOUS  FISTULA;  Surgeon: Chuck Hint, MD;  Location: [facility] OR;  Service: Vascular;  Laterality: Right;   NEPHRECTOMY  1993   for malignancy- left   RENAL BIOPSY     right   RESECTION OF ARTERIOVENOUS FISTULA ANEURYSM Right 02/14/2017   Procedure: EXCISION OF RIGHT BRACHIOCEPHALIC ARTERIOVENOUS FISTULA ANEURYSM;  Surgeon: Chuck Hint, MD;  Location: [facility] OR;  Service: Vascular;  Laterality:  Right;   TEE WITHOUT CARDIOVERSION N/A 08/27/2013   Procedure: TRANSESOPHAGEAL ECHOCARDIOGRAM (TEE);  Surgeon: Wendall Stade, MD;  Location: [facility] ENDOSCOPY;  Service: Cardiovascular;  Laterality: N/A;   TOTAL ABDOMINAL HYSTERECTOMY W/ BILATERAL SALPINGOOPHORECTOMY  1997   fibroids   TRANSTHORACIC ECHOCARDIOGRAM     03/10/15 echo ([facility]): LV cavity normal in size, focal basal hypertrophy, normal systolic function, EF 55% (visual est). Normal wall motion, no regional wall motion abnormalities. Mild diastolic dysfunction with normal LA chamber size. No significant valve stenosis or regurgitation.   VULVA / PERINEUM BIOPSY  2015    Outpatient Medications Prior to Visit  Medication Sig Dispense Refill   apixaban (ELIQUIS) 5 MG TABS tablet Take 5 mg by mouth 2 (two) times daily.     AYR SALINE NASAL DROPS NA Place 2 sprays into both nostrils daily.     Camphor-Eucalyptus-Menthol (VICKS VAPORUB EX) Apply 1 application topically daily.     Cyanocobalamin (B-12 PO) Take by mouth daily.     doxazosin (CARDURA) 2 MG tablet Take 1 tablet (2 mg total) by mouth 2 (two) times daily. 180 tablet 0   hydrochlorothiazide (HYDRODIURIL) 25 MG tablet Take 1 tablet (25 mg total) by mouth daily. 90 tablet 3   levothyroxine (SYNTHROID) 125 MCG tablet Take 1 tablet (125 mcg total) by mouth daily. 90 tablet 3   LORazepam (ATIVAN) 0.5 MG tablet Take 1 tablet (0.5 mg total) by mouth 2 (two) times daily as needed for anxiety. 30 tablet 1   losartan (COZAAR) 100 MG tablet Take 1 tablet (100 mg total) by mouth daily. 90 tablet 3   magnesium chloride (SLOW-MAG) 64 MG TBEC SR tablet Take 2 tablets (128 mg total) by mouth daily. 60 tablet 1   mycophenolate (MYFORTIC) 360 MG TBEC EC tablet Take 360 mg by mouth 2 (two) times daily.     sodium bicarbonate 650 MG tablet Take 1 tablet (650 mg total) by mouth 2 (two) times daily. Takes 1 in AM and 1 at bedtime 180 tablet 3   tacrolimus (PROGRAF) 1 MG capsule  Take 4 mg by mouth 2 (two) times daily.     triamcinolone cream (KENALOG) 0.1 % Apply 1 application topically daily.     verapamil (CALAN-SR) 120 MG CR tablet TAKE 1 TABLET BY MOUTH EVERY  AFTERNOON 90 tablet 2   verapamil (CALAN-SR) 240 MG CR tablet Take 1 tablet (240 mg total) by mouth every morning. 90 tablet 3   Vitamin D, Ergocalciferol, (DRISDOL) 1.25 MG (50000 UNIT) CAPS capsule Take 1 capsule (50,000 Units total) by mouth every 7 (seven) days. 12 capsule 1   No facility-administered medications prior to visit.    Allergies  Allergen Reactions   Labetalol     Other reaction(s): Other  Cefaclor Rash   Naproxen Rash   Enalapril Maleate Cough    Vasotec   Metoprolol Tartrate Rash    On legs    Review of Systems As per HPI  PE:    11/28/2022   10:20 AM 11/24/2022    1:58 PM 11/21/2022   11:17 AM  Vitals with BMI  Height 5\' 5"  5\' 5"  5\' 5"   Weight 233 lbs 6 oz 230 lbs 3 oz 231 lbs  BMI 38.84 38.31 38.44  Systolic 110 109 161  Diastolic 60 69 75  Pulse 88 65 112     Physical Exam  Gen: Alert, well appearing.  Patient is oriented to person, place, time, and situation. AFFECT: pleasant, lucid thought and speech. CV: Irregular no m/r/g.   LUNGS: CTA bilat, nonlabored resps, good aeration in all lung fields. EXT: no clubbing or cyanosis.  no edema.    LABS:  Last CBC Lab Results  Component Value Date   WBC 7.1 11/20/2022   HGB 13.3 11/20/2022   HCT 40.8 11/20/2022   MCV 90.7 11/20/2022   MCH 29.6 11/20/2022   RDW 13.0 11/20/2022   PLT 245 11/20/2022   Last metabolic panel Lab Results  Component Value Date   GLUCOSE 126 (H) 11/20/2022   NA 137 11/20/2022   K 3.8 11/20/2022   CL 107 11/20/2022   CO2 23 11/20/2022   BUN 15 11/20/2022   CREATININE 1.00 11/20/2022   GFRNONAA 58 (L) 11/20/2022   CALCIUM 9.1 11/20/2022   PHOS 2.9 01/04/2018   PROT 6.6 11/20/2022   ALBUMIN 3.4 (L) 11/20/2022   BILITOT 0.6 11/20/2022   ALKPHOS 65 11/20/2022   AST 18  11/20/2022   ALT 13 11/20/2022   ANIONGAP 7 11/20/2022   Last lipids Lab Results  Component Value Date   CHOL 148 09/13/2022   HDL 41.30 09/13/2022   LDLCALC 79 09/13/2022   LDLDIRECT 166.3 10/02/2007   TRIG 140.0 09/13/2022   CHOLHDL 4 09/13/2022   Last hemoglobin A1c Lab Results  Component Value Date   HGBA1C 5.4 02/23/2012   Last thyroid functions Lab Results  Component Value Date   TSH 0.058 (L) 11/20/2022   Last vitamin D Lab Results  Component Value Date   VD25OH 14.49 (L) 09/13/2022   Last vitamin B12 and Folate Lab Results  Component Value Date   VITAMINB12 417 11/17/2022   IMPRESSION AND PLAN:  New diagnosis A-fib. Heart rate reasonably well-controlled on 240 mg verapamil SR in the morning and 120 mg in the evening. No changes today. Continue Eliquis 5 mg twice daily. She has echocardiogram scheduled for later this month.  She has A-fib clinic establish care visit with [facility] set but we will see if we can switch this to [facility] cardiology per her preference for the [facility] location..  2.  Hypertension.  Blood pressure has been normal and at times even soft. She takes hydrochlorothiazide infrequently.  Takes losartan 100 mg a day. Plan is to discontinue hydrochlorothiazide completely.  #3 acquired hypothyroidism, most recent TSH low. As of 11/21/22, she is taking one half of 125 mcg tab on 2 days a week and a whole tab on all other days. Plan recheck TSH approximately 12/20/2022.  #4 low magnesium. She is on replacement now.  Will recheck magnesium level today.  An After Visit Summary was printed and given to the patient.  FOLLOW UP: No follow-ups on file. Next cpe 09/2023  Signed:  Santiago Bumpers, MD  11/28/2022  

## 2022-11-28 NOTE — Telephone Encounter (Signed)
Tabitha Green,  This patient was referred to [facility] a-fib clinic but prefers to be seen at [facility] location. We placed a new referral today. Please advise on next steps or if we need to do anything else.

## 2022-12-01 ENCOUNTER — Ambulatory Visit (HOSPITAL_COMMUNITY)
Admission: RE | Admit: 2022-12-01 | Discharge: 2022-12-01 | Disposition: A | Discharge status: Home / Self Care | Payer: Medicare Other | Source: Ambulatory Visit | Attending: Internal Medicine | Admitting: Internal Medicine

## 2022-12-01 ENCOUNTER — Inpatient Hospital Stay (HOSPITAL_COMMUNITY)
Admission: RE | Admit: 2022-12-01 | Discharge: 2022-12-01 | Disposition: A | Discharge status: Home / Self Care | Payer: Medicare Other | Source: Ambulatory Visit | Attending: Internal Medicine | Admitting: Internal Medicine

## 2022-12-01 VITALS — BP 158/92 | HR 101 | Ht 65.0 in | Wt 231.6 lb

## 2022-12-01 DIAGNOSIS — I480 Paroxysmal atrial fibrillation: Secondary | ICD-10-CM

## 2022-12-01 DIAGNOSIS — I10 Essential (primary) hypertension: Secondary | ICD-10-CM | POA: Diagnosis not present

## 2022-12-01 DIAGNOSIS — D6869 Other thrombophilia: Secondary | ICD-10-CM

## 2022-12-01 DIAGNOSIS — E669 Obesity, unspecified: Secondary | ICD-10-CM | POA: Insufficient documentation

## 2022-12-01 DIAGNOSIS — Z6838 Body mass index (BMI) 38.0-38.9, adult: Secondary | ICD-10-CM | POA: Insufficient documentation

## 2022-12-01 DIAGNOSIS — E039 Hypothyroidism, unspecified: Secondary | ICD-10-CM | POA: Insufficient documentation

## 2022-12-01 DIAGNOSIS — Z7901 Long term (current) use of anticoagulants: Secondary | ICD-10-CM | POA: Insufficient documentation

## 2022-12-01 DIAGNOSIS — I4891 Unspecified atrial fibrillation: Secondary | ICD-10-CM

## 2022-12-01 DIAGNOSIS — Z79899 Other long term (current) drug therapy: Secondary | ICD-10-CM | POA: Insufficient documentation

## 2022-12-01 DIAGNOSIS — E785 Hyperlipidemia, unspecified: Secondary | ICD-10-CM | POA: Insufficient documentation

## 2022-12-01 NOTE — Progress Notes (Signed)
Primary Care Physician: Jeoffrey Massed, MD Primary Cardiologist: None Primary Electrophysiologist: None Referring Physician: Dr. Charlena Cross is a 77 y.o. female with a history of renal transplant, chronic renal insufficiency stage III, HTN, HLD, hypothyroidism, hypomagnesemia, and paroxysmal atrial fibrillation who presents for consultation in the [facility]. Review of records show being seen by Dr. Ladona Ridgel in 2015 for history of SVT on propranolol and verapamil (prior to renal transplant). The patient was initially diagnosed with atrial fibrillation on 11/15/22 after presenting to [facility] ED with symptoms of dizziness and feeling funny. Currently taking verapamil SR 240 mg AM and 120 mg PM. Echocardiogram scheduled. Patient is on Eliquis 5 mg BID for a CHADS2VASC score of 5.  On evaluation today, she is currently in Afib. She cannot really tell when she is in Afib. She has a pulse oximeter at home and not sure if it is working correctly. She denies any chest pain, palpitations, or shortness of breath. She missed a dose of Eliquis on 4/23. Her BP is elevated today in office but she shows me other BP recording of 110/70 on current medication regimen.   Today, she denies symptoms of palpitations, chest pain, shortness of breath, orthopnea, PND, lower extremity edema, dizziness, presyncope, syncope, snoring, daytime somnolence, bleeding, or neurologic sequela. The patient is tolerating medications without difficulties and is otherwise without complaint today.   Atrial Fibrillation Risk Factors:  she does not have symptoms or diagnosis of sleep apnea. she does not have a history of rheumatic fever. she does not have a history of alcohol use. The patient does not have a history of early familial atrial fibrillation or other arrhythmias.  she has a BMI of Body mass index is 38.54 kg/m.[name] Filed Weights   12/01/22 1011  Weight: 105.1 kg    Family History   Problem Relation Age of Onset   Deep vein thrombosis Mother        post thyroid surgery   Hypertension Mother    Heart attack Father 31       deceased   Hypertension Father    Cancer Sister        breast   Cancer Paternal Aunt        pancreatic   Diabetes Maternal Grandfather    Stroke Paternal Grandmother        in 47s   Fibroids Daughter    Colon cancer Neg Hx    Esophageal cancer Neg Hx    Stomach cancer Neg Hx    Rectal cancer Neg Hx    Sleep apnea Neg Hx      Atrial Fibrillation Management history:  Previous antiarrhythmic drugs: None Previous cardioversions: None Previous ablations: None Anticoagulation history: Eliquis 5 mg BID   Past Medical History:  Diagnosis Date   Anxiety    Arthritis    Cholelithiasis 04/2021   noted on CT abd/pelv done for R lower abd swelling and MR abd   Chronic renal insufficiency, stage III (moderate) 10/2015   Post-transplant baseline sCr 1.2-1.4.     CMV infection summer 2017   Valcyte per ID/Renal transplant team   Depression    Diverticulosis    a. 05/2012 colonoscopy   Erosive lichen planus of vulva    Topical steroids (managed by Dr. Robby [facility]ermon [facility] via [facility].   ESRD (end stage renal disease)    began dialysis 2014--followed by Dr. Lowell Guitar.  Received deceased donor kidney transplant 10/2015.  Fatty liver 10/2021   with hepatomegaly   FSGS (focal segmental glomerulosclerosis)    right kidney; hx of left renal cell cancer and got nephrectomy 1993.   GERD (gastroesophageal reflux disease)    Gout    Heart murmur    (Diastolic) ECHO 08/2013 showed that this murmur is coming from pt's R arm AV fistula   Hiatal hernia    History of renal cell cancer 1993   Left nephrectomy   Hyperlipidemia    Hypertension    Hypothyroidism    Impingement syndrome of left shoulder 04/2016   Dr. Bertram Savin to PT   Kidney cancer, primary, with metastasis from kidney to other site    Lumbar  spinal stenosis    Chronic LBP with bilat neurogenic claudication   Metatarsal fracture, pathologic 01/2018   Right; orthocarolina-->post op shoe continued, f/u x-ray planned.   Obesity    Osteoarthritis of left knee 08/2017   severe, diffuse, tricompartmental.  Responded to steroid injection.     Osteoporosis 06/07/2016   DEXA T-score of -3.1.  Fosamax planned 2017 but dental work prevented start..  Pathological toe fx 12/2017--repeat DEXA 08/2018, T-score -3.3 radius. 12/2020 T score radius -3.7.   Pneumonia    Renal transplant recipient 11-17-2015   Baseline Cr as of 09/2020 1.2-1.4   Simple hepatic cyst    2022   Solitary kidney, acquired    SVT (supraventricular tachycardia)    UTI (urinary tract infection)    Vitamin B12 deficiency 12/2018   Starting replacement as of 12/11/2018   Past Surgical History:  Procedure Laterality Date   AV FISTULA PLACEMENT Right    Right arm: aneurismal dilatation 2017 being followed by CV surgeons   CARDIOVASCULAR STRESS TEST     03/10/15 ETT ([facility]): Exercise ECG negative at 83% max predicted HR.    CATARACT EXTRACTION     COLONOSCOPY  2003; 05/2012   diverticulosis, no polyps.  Recall 10 yrs   colonoscopy with polypectomy     Dr Marina Goodell   DEXA  06/07/2016; 08/22/2018; 12/30/20   2017 T-score -3.1.  2020 T score -3.3.  2022 T score -3.7   HYSTERECTOMY ABDOMINAL WITH SALPINGECTOMY     KIDNEY TRANSPLANT  November 17, 2015   Deceased donor kidney transplant, with Thymoglobulin induction   LIGATION OF ARTERIOVENOUS  FISTULA Right 02/14/2017   Procedure: LIGATION OF ARTERIOVENOUS  FISTULA;  Surgeon: Chuck Hint, MD;  Location: [facility] OR;  Service: Vascular;  Laterality: Right;   NEPHRECTOMY  1993   for malignancy- left   RENAL BIOPSY     right   RESECTION OF ARTERIOVENOUS FISTULA ANEURYSM Right 02/14/2017   Procedure: EXCISION OF RIGHT BRACHIOCEPHALIC ARTERIOVENOUS FISTULA ANEURYSM;  Surgeon: Chuck Hint, MD;  Location: [facility] OR;  Service:  Vascular;  Laterality: Right;   TEE WITHOUT CARDIOVERSION N/A 08/27/2013   Procedure: TRANSESOPHAGEAL ECHOCARDIOGRAM (TEE);  Surgeon: Wendall Stade, MD;  Location: [facility] ENDOSCOPY;  Service: Cardiovascular;  Laterality: N/A;   TOTAL ABDOMINAL HYSTERECTOMY W/ BILATERAL SALPINGOOPHORECTOMY  1997   fibroids   TRANSTHORACIC ECHOCARDIOGRAM     03/10/15 echo ([facility]): LV cavity normal in size, focal basal hypertrophy, normal systolic function, EF 55% (visual est). Normal wall motion, no regional wall motion abnormalities. Mild diastolic dysfunction with normal LA chamber size. No significant valve stenosis or regurgitation.   VULVA / PERINEUM BIOPSY  2015    Current Outpatient Medications  Medication Sig Dispense Refill   apixaban (ELIQUIS) 5 MG TABS tablet  Take 5 mg by mouth 2 (two) times daily.     AYR SALINE NASAL DROPS NA Place 2 sprays into both nostrils daily.     Camphor-Eucalyptus-Menthol (VICKS VAPORUB EX) Apply 1 application topically daily.     Cyanocobalamin (B-12 PO) Take 1 tablet by mouth daily.     doxazosin (CARDURA) 2 MG tablet Take 1 tablet (2 mg total) by mouth 2 (two) times daily. 180 tablet 0   levothyroxine (SYNTHROID) 125 MCG tablet 1/2 tab po on two days a week, whole tab all other days 90 tablet 3   LORazepam (ATIVAN) 0.5 MG tablet Take 1 tablet (0.5 mg total) by mouth 2 (two) times daily as needed for anxiety. 30 tablet 1   losartan (COZAAR) 100 MG tablet Take 1 tablet (100 mg total) by mouth daily. 90 tablet 3   magnesium chloride (SLOW-MAG) 64 MG TBEC SR tablet Take 2 tablets (128 mg total) by mouth daily. (Patient taking differently: Take 3 tablets by mouth daily.) 60 tablet 1   mycophenolate (MYFORTIC) 360 MG TBEC EC tablet Take 360 mg by mouth 2 (two) times daily.     sodium bicarbonate 650 MG tablet Take 1 tablet (650 mg total) by mouth 2 (two) times daily. Takes 1 in AM and 1 at bedtime 180 tablet 3   tacrolimus (PROGRAF) 1 MG capsule Take 4 mg by  mouth 2 (two) times daily.     triamcinolone cream (KENALOG) 0.1 % Apply 1 application topically daily.     verapamil (CALAN-SR) 120 MG CR tablet TAKE 1 TABLET BY MOUTH EVERY  AFTERNOON 90 tablet 2   verapamil (CALAN-SR) 240 MG CR tablet Take 1 tablet (240 mg total) by mouth every morning. 90 tablet 3   Vitamin D, Ergocalciferol, (DRISDOL) 1.25 MG (50000 UNIT) CAPS capsule Take 1 capsule (50,000 Units total) by mouth every 7 (seven) days. 12 capsule 1   No current facility-administered medications for this encounter.    Allergies  Allergen Reactions   Labetalol     Other reaction(s): Other   Cefaclor Rash   Naproxen Rash   Enalapril Maleate Cough    Vasotec   Metoprolol Tartrate Rash    On legs    Social History   Socioeconomic History   Marital status: Married    Spouse name: Not on file   Number of children: 2   Years of education: Not on file   Highest education level: 12th grade  Occupational History   Occupation: Retired  Tobacco Use   Smoking status: Never   Smokeless tobacco: Never  Vaping Use   Vaping Use: Never used  Substance and Sexual Activity   Alcohol use: Yes    Comment: rarely   Drug use: No   Sexual activity: Not on file  Other Topics Concern   Not on file  Social History Narrative   Lives in [place] with husband.  Retired.  Previously worked in [employer] @ [employer].   No T/A/Ds.   Social Determinants of Health   Financial Resource Strain: Low Risk  (09/14/2022)   Overall Financial Resource Strain (CARDIA)    Difficulty of Paying Living Expenses: Not hard at all  Food Insecurity: No Food Insecurity (09/14/2022)   Hunger Vital Sign    Worried About Running Out of Food in the Last Year: Never true    Ran Out of Food in the Last Year: Never true  Transportation Needs: No Transportation Needs (09/14/2022)   PRAPARE - Transportation    Lack  of Transportation (Medical): No    Lack of Transportation (Non-Medical): No  Physical Activity:  Insufficiently Active (09/14/2022)   Exercise Vital Sign    Days of Exercise per Week: 2 days    Minutes of Exercise per Session: 20 min  Stress: Stress Concern Present (09/14/2022)   [employer] of Occupational Health - Occupational Stress Questionnaire    Feeling of Stress : To some extent  Social Connections: Moderately Integrated (09/14/2022)   Social Connection and Isolation Panel [NHANES]    Frequency of Communication with Friends and Family: More than three times a week    Frequency of Social Gatherings with Friends and Family: More than three times a week    Attends Religious Services: 1 to 4 times per year    Active Member of [employer] or Organizations: No    Attends Banker Meetings: Never    Marital Status: Married  Catering manager Violence: Not At Risk (09/14/2022)   Humiliation, Afraid, Rape, and Kick questionnaire    Fear of Current or Ex-Partner: No    Emotionally Abused: No    Physically Abused: No    Sexually Abused: No     ROS- All systems are reviewed and negative except as per the HPI above.  Physical Exam: Vitals:   12/01/22 1011  BP: (!) 158/92  Pulse: (!) 101  Weight: 105.1 kg  Height:  (1.651 m)    GEN- The patient is a well appearing anxious female, alert and oriented x 3 today.   Head- normocephalic, atraumatic Eyes-  Sclera clear, conjunctiva pink Ears- hearing intact Oropharynx- clear Neck- supple  Lungs- Clear to ausculation bilaterally, normal work of breathing Heart- Irregular rate and rhythm, no murmurs, rubs or gallops  GI- soft, NT, ND, + BS Extremities- no clubbing, cyanosis, or edema MS- no significant deformity or atrophy Skin- no rash or lesion Psych- euthymic mood, full affect Neuro- strength and sensation are intact  Wt Readings from Last 3 Encounters:  12/01/22 105.1 kg  11/28/22 105.9 kg  11/24/22 104.4 kg    EKG today demonstrates  Vent. rate 101 BPM PR interval * ms QRS duration 90 ms QT/QTcB 358/464  ms P-R-T axes * 0 41 Atrial fibrillation with rapid ventricular response Nonspecific ST abnormality Abnormal ECG When compared with ECG of 20-Nov-2022 12:19, PREVIOUS ECG IS PRESENT  Echo 08/27/13 TEE demonstrated: Study Conclusions   - Left ventricle: The cavity size was normal. There was mild    concentric hypertrophy. Systolic function was normal. The    estimated ejection fraction was in the range of 60% to    65%.  - Mitral valve: Mild regurgitation.  - Left atrium: The atrium was dilated.  - Right atrium: No evidence of thrombus in the atrial cavity    or appendage.  - Atrial septum: No defect or patent foramen ovale was    identified. Echo contrast study showed no right-to-left    atrial level shunt, following an increase in RA pressure    induced by provocative maneuvers.  - Pulmonic valve: No evidence of vegetation.  - Impressions: Source of diatolic murmur not immediately    apparent AR is mild and murmur is fairly loud. Appears to    be high flow in the venous return and pulmonary outflow    tract that may be related to her renal fistula   Epic records are reviewed at length today.  CHA2DS2-VASc Score = 5  The patient's score is based upon: CHF History: 0 HTN  History: 1 Diabetes History: 0 Stroke History: 0 Vascular Disease History: 1 Age Score: 2 Gender Score: 1       ASSESSMENT AND PLAN: Paroxysmal Atrial Fibrillation (ICD10:  I48.0) The patient's CHA2DS2-VASc score is 5, indicating a 7.2% annual risk of stroke.    Education provided about Afib with visual diagram. Discussion about medication treatments and ablation in detail. After discussion, she mentioned different HR noted on pulse oximeter at different times being above 100 or in the 60s. I am going to place a cardiac monitor for 2 weeks to determine Afib burden. I'm not sure if she is going in and out of Afib. If she is persistent, will proceed with scheduling DCCV.  Cardiac monitor for 2 weeks 1  month f/u to discuss results. Continue current medication regimen.   2. Secondary Hypercoagulable State (ICD10:  D68.69) The patient is at significant risk for stroke/thromboembolism based upon her CHA2DS2-VASc Score of 5.  Continue Apixaban (Eliquis).   Education on important for compliance. She missed a dose on 4/23.   3. Obesity Body mass index is 38.54 kg/m. Lifestyle modification was discussed at length including regular exercise and weight reduction. Encouraged daily walking as tolerated.  4. HTN Elevated today but she admits to being very nervous (she does have a nervous affect in office). She showed me a BP at PCP office which was 110/70. Due to this, I have asked her to trend BP at home and will continue current medication regimen without change.    Follow up in 1 month Afib clinic.    L[place] Bells, PA-C Afib Clinic [facility] [address] [place], [place] [address] [phone] 12/01/2022 11:41 AM

## 2022-12-13 ENCOUNTER — Ambulatory Visit (INDEPENDENT_AMBULATORY_CARE_PROVIDER_SITE_OTHER): Payer: Medicare Other | Admitting: Family Medicine

## 2022-12-13 ENCOUNTER — Encounter: Payer: Self-pay | Admitting: Family Medicine

## 2022-12-13 VITALS — BP 142/83 | HR 70 | Wt 232.6 lb

## 2022-12-13 DIAGNOSIS — I4891 Unspecified atrial fibrillation: Secondary | ICD-10-CM | POA: Diagnosis not present

## 2022-12-13 DIAGNOSIS — E039 Hypothyroidism, unspecified: Secondary | ICD-10-CM

## 2022-12-13 DIAGNOSIS — R7989 Other specified abnormal findings of blood chemistry: Secondary | ICD-10-CM

## 2022-12-13 MED ORDER — APIXABAN 5 MG PO TABS: 5.0000 mg | ORAL_TABLET | Freq: Two times a day (BID) | ORAL | 1 refills | Status: DC

## 2022-12-13 NOTE — Progress Notes (Signed)
OFFICE VISIT  12/13/2022  CC:  Chief Complaint  Patient presents with   Follow-up    Follow up on BP and Afib.    Patient is a 77 y.o. female who presents for follow-up A-fib and blood pressures. A/P as of last visit: "New diagnosis A-fib. Heart rate reasonably well-controlled on 240 mg verapamil SR in the morning and 120 mg in the evening. No changes today. Continue Eliquis 5 mg twice daily. She has echocardiogram scheduled for later this month.  She has A-fib clinic establish care visit with [facility] set but we will see if we can switch this to [facility] cardiology per her preference for the [facility] location..   2.  Hypertension.  Blood pressure has been normal and at times even soft. She takes hydrochlorothiazide infrequently.  Takes losartan 100 mg a day. Plan is to discontinue hydrochlorothiazide completely.   #3 acquired hypothyroidism, most recent TSH low. As of 11/21/22, she is taking one half of 125 mcg tab on 2 days a week and a whole tab on all other days. Plan recheck TSH approximately 12/20/2022.   #4 low magnesium. She is on replacement now.  Will recheck magnesium level today."  INTERIM HX: Liesha feels well.  She occasionally feels a little palpitation but nothing prolonged.  No dizziness, no chest pain, no shortness of breath.  She established with A-fib clinic on 12/01/2022.  A 2-week event monitor was ordered to determine A-fib burden. She has not had an echocardiogram yet but it is scheduled in about 2 weeks.   Past Medical History:  Diagnosis Date   Anxiety    Arthritis    Cholelithiasis 04/2021   noted on CT abd/pelv done for R lower abd swelling and MR abd   Chronic renal insufficiency, stage III (moderate) (HCC) 10/2015   Post-transplant baseline sCr 1.2-1.4.     CMV infection [facility]) summer 2017   Valcyte per ID/Renal transplant team   Depression    Diverticulosis    a. 05/2012 colonoscopy   Erosive lichen planus of vulva    Topical  steroids (managed by Dr. Robby [facility]on [facility].   ESRD (end stage renal disease) (HCC)    began dialysis 2014--followed by Dr. Lowell Guitar.  Received deceased donor kidney transplant 10/2015.   Fatty liver 10/2021   with hepatomegaly   FSGS (focal segmental glomerulosclerosis)    right kidney; hx of left renal cell cancer and got nephrectomy 1993.   GERD (gastroesophageal reflux disease)    Gout    Heart murmur    (Diastolic) ECHO 08/2013 showed that this murmur is coming from pt's R arm AV fistula   Hiatal hernia    History of renal cell cancer 1993   Left nephrectomy   Hyperlipidemia    Hypertension    Hypothyroidism    Impingement syndrome of left shoulder 04/2016   Dr. Bertram Savin to PT   Kidney cancer, primary, with metastasis from kidney to other site [facility])    Lumbar spinal stenosis    Chronic LBP with bilat neurogenic claudication   Metatarsal fracture, pathologic 01/2018   Right; orthocarolina-->post op shoe continued, f/u x-ray planned.   Obesity    Osteoarthritis of left knee 08/2017   severe, diffuse, tricompartmental.  Responded to steroid injection.     Osteoporosis 06/07/2016   DEXA T-score of -3.1.  Fosamax planned 2017 but dental work prevented start..  Pathological toe fx 12/2017--repeat DEXA 08/2018, T-score -3.3 radius. 12/2020 T score  radius -3.7.   Pneumonia    Renal transplant recipient November 13, 2015   Baseline Cr as of 09/2020 1.2-1.4   Simple hepatic cyst    2022   Solitary kidney, acquired    SVT (supraventricular tachycardia)    UTI (urinary tract infection)    Vitamin B12 deficiency 12/2018   Starting replacement as of 12/11/2018    Past Surgical History:  Procedure Laterality Date   AV FISTULA PLACEMENT Right    Right arm: aneurismal dilatation 2017 being followed by CV surgeons   CARDIOVASCULAR STRESS TEST     03/10/15 ETT ([facility]): Exercise ECG negative at 83% max predicted HR.    CATARACT EXTRACTION      COLONOSCOPY  2003; 05/2012   diverticulosis, no polyps.  Recall 10 yrs   colonoscopy with polypectomy     Dr Marina Goodell   DEXA  06/07/2016; 08/22/2018; 12/30/20   2017 T-score -3.1.  2020 T score -3.3.  2022 T score -3.7   HYSTERECTOMY ABDOMINAL WITH SALPINGECTOMY     KIDNEY TRANSPLANT  11-13-15   Deceased donor kidney transplant, with Thymoglobulin induction   LIGATION OF ARTERIOVENOUS  FISTULA Right 02/14/2017   Procedure: LIGATION OF ARTERIOVENOUS  FISTULA;  Surgeon: Chuck Hint, MD;  Location: [facility] OR;  Service: Vascular;  Laterality: Right;   NEPHRECTOMY  1993   for malignancy- left   RENAL BIOPSY     right   RESECTION OF ARTERIOVENOUS FISTULA ANEURYSM Right 02/14/2017   Procedure: EXCISION OF RIGHT BRACHIOCEPHALIC ARTERIOVENOUS FISTULA ANEURYSM;  Surgeon: Chuck Hint, MD;  Location: [facility] OR;  Service: Vascular;  Laterality: Right;   TEE WITHOUT CARDIOVERSION N/A 08/27/2013   Procedure: TRANSESOPHAGEAL ECHOCARDIOGRAM (TEE);  Surgeon: Wendall Stade, MD;  Location: [facility] ENDOSCOPY;  Service: Cardiovascular;  Laterality: N/A;   TOTAL ABDOMINAL HYSTERECTOMY W/ BILATERAL SALPINGOOPHORECTOMY  1997   fibroids   TRANSTHORACIC ECHOCARDIOGRAM     03/10/15 echo ([facility]): LV cavity normal in size, focal basal hypertrophy, normal systolic function, EF 55% (visual est). Normal wall motion, no regional wall motion abnormalities. Mild diastolic dysfunction with normal LA chamber size. No significant valve stenosis or regurgitation.   VULVA / PERINEUM BIOPSY  2015    Outpatient Medications Prior to Visit  Medication Sig Dispense Refill   AYR SALINE NASAL DROPS NA Place 2 sprays into both nostrils daily.     Camphor-Eucalyptus-Menthol (VICKS VAPORUB EX) Apply 1 application topically daily.     Cyanocobalamin (B-12 PO) Take 1 tablet by mouth daily.     doxazosin (CARDURA) 2 MG tablet Take 1 tablet (2 mg total) by mouth 2 (two) times daily. 180 tablet 0    levothyroxine (SYNTHROID) 125 MCG tablet 1/2 tab po on two days a week, whole tab all other days 90 tablet 3   LORazepam (ATIVAN) 0.5 MG tablet Take 1 tablet (0.5 mg total) by mouth 2 (two) times daily as needed for anxiety. 30 tablet 1   losartan (COZAAR) 100 MG tablet Take 1 tablet (100 mg total) by mouth daily. 90 tablet 3   magnesium chloride (SLOW-MAG) 64 MG TBEC SR tablet Take 2 tablets (128 mg total) by mouth daily. (Patient taking differently: Take 3 tablets by mouth daily.) 60 tablet 1   mycophenolate (MYFORTIC) 360 MG TBEC EC tablet Take 360 mg by mouth 2 (two) times daily.     sodium bicarbonate 650 MG tablet Take 1 tablet (650 mg total) by mouth 2 (two) times daily. Takes 1 in AM and  1 at bedtime 180 tablet 3   tacrolimus (PROGRAF) 1 MG capsule Take 4 mg by mouth 2 (two) times daily.     triamcinolone cream (KENALOG) 0.1 % Apply 1 application topically daily.     verapamil (CALAN-SR) 120 MG CR tablet TAKE 1 TABLET BY MOUTH EVERY  AFTERNOON 90 tablet 2   verapamil (CALAN-SR) 240 MG CR tablet Take 1 tablet (240 mg total) by mouth every morning. 90 tablet 3   Vitamin D, Ergocalciferol, (DRISDOL) 1.25 MG (50000 UNIT) CAPS capsule Take 1 capsule (50,000 Units total) by mouth every 7 (seven) days. 12 capsule 1   apixaban (ELIQUIS) 5 MG TABS tablet Take 5 mg by mouth 2 (two) times daily.     No facility-administered medications prior to visit.    Allergies  Allergen Reactions   Labetalol     Other reaction(s): Other   Cefaclor Rash   Naproxen Rash   Enalapril Maleate Cough    Vasotec   Metoprolol Tartrate Rash    On legs    Review of Systems As per HPI  PE:    12/13/2022    9:55 AM 12/01/2022   10:11 AM 11/28/2022   10:20 AM  Vitals with BMI  Height  5\' 5"  5\' 5"   Weight 232 lbs 10 oz 231 lbs 10 oz 233 lbs 6 oz  BMI 38.71 38.54 38.84  Systolic 142 158 161  Diastolic 83 92 60  Pulse 70 101 88   Physical Exam  Gen: Alert, well appearing.  Patient is oriented to person,  place, time, and situation. AFFECT: pleasant, lucid thought and speech. CV: irreg irreg, no murmur, rate about 65  LABS:  Last CBC Lab Results  Component Value Date   WBC 7.1 11/20/2022   HGB 13.3 11/20/2022   HCT 40.8 11/20/2022   MCV 90.7 11/20/2022   MCH 29.6 11/20/2022   RDW 13.0 11/20/2022   PLT 245 11/20/2022   Last metabolic panel Lab Results  Component Value Date   GLUCOSE 126 (H) 11/20/2022   NA 137 11/20/2022   K 3.8 11/20/2022   CL 107 11/20/2022   CO2 23 11/20/2022   BUN 15 11/20/2022   CREATININE 1.00 11/20/2022   GFRNONAA 58 (L) 11/20/2022   CALCIUM 9.1 11/20/2022   PHOS 2.9 01/04/2018   PROT 6.6 11/20/2022   ALBUMIN 3.4 (L) 11/20/2022   BILITOT 0.6 11/20/2022   ALKPHOS 65 11/20/2022   AST 18 11/20/2022   ALT 13 11/20/2022   ANIONGAP 7 11/20/2022   Last thyroid functions Lab Results  Component Value Date   TSH 0.058 (L) 11/20/2022   IMPRESSION AND PLAN:  #1 A-fib, doing well on Eliquis and Calan SR to 240 every morning and 120 every afternoon. She is established with A-fib clinic. She has long-term monitor on to determine A-fib burden. She is largely asymptomatic.  #2 hypomagnesemia.  Increased dose to 3 tabs per day on 11/28/2022. Plan recheck magnesium level in 1 week.  #3 hypothyroidism. However, TSH on 11/20/2022 was suppressed --- 0.058.  As of 11/21/22, she is taking one half of 125 mcg tab on 2 days a week and a whole tab on all other days.  TSH recheck in 1 week.  An After Visit Summary was printed and given to the patient.  FOLLOW UP: Return in about 4 weeks (around 01/10/2023) for a-fib and BP.  Signed:  Santiago Bumpers, MD           12/13/2022

## 2022-12-19 ENCOUNTER — Other Ambulatory Visit (INDEPENDENT_AMBULATORY_CARE_PROVIDER_SITE_OTHER): Payer: Medicare Other

## 2022-12-19 DIAGNOSIS — R7989 Other specified abnormal findings of blood chemistry: Secondary | ICD-10-CM | POA: Diagnosis not present

## 2022-12-19 DIAGNOSIS — E039 Hypothyroidism, unspecified: Secondary | ICD-10-CM

## 2022-12-19 LAB — TSH: TSH: 4.36 u[IU]/mL (ref 0.35–5.50)

## 2022-12-19 LAB — MAGNESIUM: Magnesium: 1.4 mg/dL — ABNORMAL LOW (ref 1.5–2.5)

## 2022-12-23 DIAGNOSIS — I4891 Unspecified atrial fibrillation: Secondary | ICD-10-CM | POA: Diagnosis not present

## 2022-12-26 NOTE — Addendum Note (Signed)
Encounter addended by: Shona Simpson, RN on: 12/26/2022 12:21 PM  Actions taken: Imaging Exam ended

## 2022-12-28 ENCOUNTER — Ambulatory Visit (HOSPITAL_COMMUNITY)
Admission: RE | Admit: 2022-12-28 | Discharge: 2022-12-28 | Disposition: A | Discharge status: Home / Self Care | Payer: Medicare Other | Source: Ambulatory Visit | Attending: Family Medicine | Admitting: Family Medicine

## 2022-12-28 DIAGNOSIS — N189 Chronic kidney disease, unspecified: Secondary | ICD-10-CM | POA: Insufficient documentation

## 2022-12-28 DIAGNOSIS — E669 Obesity, unspecified: Secondary | ICD-10-CM | POA: Diagnosis not present

## 2022-12-28 DIAGNOSIS — E039 Hypothyroidism, unspecified: Secondary | ICD-10-CM | POA: Insufficient documentation

## 2022-12-28 DIAGNOSIS — I480 Paroxysmal atrial fibrillation: Secondary | ICD-10-CM | POA: Diagnosis not present

## 2022-12-28 DIAGNOSIS — I4891 Unspecified atrial fibrillation: Secondary | ICD-10-CM | POA: Insufficient documentation

## 2022-12-28 DIAGNOSIS — I517 Cardiomegaly: Secondary | ICD-10-CM | POA: Insufficient documentation

## 2022-12-28 LAB — ECHOCARDIOGRAM COMPLETE
Calc EF: 59.2 %
S' Lateral: 2.8 cm
Single Plane A2C EF: 56 %
Single Plane A4C EF: 59.8 %

## 2022-12-29 ENCOUNTER — Ambulatory Visit (HOSPITAL_COMMUNITY)
Admission: RE | Admit: 2022-12-29 | Discharge: 2022-12-29 | Disposition: A | Discharge status: Home / Self Care | Payer: Medicare Other | Source: Ambulatory Visit | Attending: Internal Medicine | Admitting: Internal Medicine

## 2022-12-29 ENCOUNTER — Encounter: Payer: Self-pay | Admitting: Family Medicine

## 2022-12-29 VITALS — BP 106/60 | HR 95 | Wt 231.6 lb

## 2022-12-29 DIAGNOSIS — I480 Paroxysmal atrial fibrillation: Secondary | ICD-10-CM

## 2022-12-29 DIAGNOSIS — D6869 Other thrombophilia: Secondary | ICD-10-CM

## 2022-12-29 DIAGNOSIS — I4819 Other persistent atrial fibrillation: Secondary | ICD-10-CM

## 2022-12-29 DIAGNOSIS — I10 Essential (primary) hypertension: Secondary | ICD-10-CM | POA: Diagnosis not present

## 2022-12-29 DIAGNOSIS — Z6838 Body mass index (BMI) 38.0-38.9, adult: Secondary | ICD-10-CM | POA: Insufficient documentation

## 2022-12-29 DIAGNOSIS — E669 Obesity, unspecified: Secondary | ICD-10-CM | POA: Insufficient documentation

## 2022-12-29 DIAGNOSIS — D6859 Other primary thrombophilia: Secondary | ICD-10-CM | POA: Diagnosis not present

## 2022-12-29 LAB — BASIC METABOLIC PANEL
Anion gap: 8 (ref 5–15)
BUN: 15 mg/dL (ref 8–23)
CO2: 23 mmol/L (ref 22–32)
Calcium: 9.5 mg/dL (ref 8.9–10.3)
Chloride: 108 mmol/L (ref 98–111)
Creatinine, Ser: 1 mg/dL (ref 0.44–1.00)
GFR, Estimated: 58 mL/min — ABNORMAL LOW (ref >60)
Glucose, Bld: 106 mg/dL — ABNORMAL HIGH (ref 70–99)
Potassium: 3.9 mmol/L (ref 3.5–5.1)
Sodium: 139 mmol/L (ref 135–145)

## 2022-12-29 LAB — CBC
HCT: 40.6 % (ref 36.0–46.0)
Hemoglobin: 13.2 g/dL (ref 12.0–15.0)
MCH: 28.7 pg (ref 26.0–34.0)
MCHC: 32.5 g/dL (ref 30.0–36.0)
MCV: 88.3 fL (ref 80.0–100.0)
Platelets: 231 10*3/uL (ref 150–400)
RBC: 4.6 MIL/uL (ref 3.87–5.11)
RDW: 13.2 % (ref 11.5–15.5)
WBC: 7.7 10*3/uL (ref 4.0–10.5)
nRBC: 0 % (ref 0.0–0.2)

## 2022-12-29 NOTE — Patient Instructions (Addendum)
Cardioversion scheduled for: Wednesday, May 29th   - Arrive at the [facility] and go to admitting at [facility] not eat or drink anything after midnight the night prior to your procedure.   - Take all your morning medication (except diabetic medications) with a sip of water prior to arrival.  - You will not be able to drive home after your procedure.    - Do NOT miss any doses of your blood thinner - if you should miss a dose please notify our office immediately.   - If you feel as if you go back into normal rhythm prior to scheduled cardioversion, please notify our office immediately.   If your procedure is canceled in the cardioversion suite you will be charged a cancellation fee.

## 2022-12-29 NOTE — Progress Notes (Signed)
Primary Care Physician: Jeoffrey Massed, MD Primary Cardiologist: None Primary Electrophysiologist: None Referring Physician: Dr. Charlena Cross is a 77 y.o. female with a history of renal transplant, chronic renal insufficiency stage III, HTN, HLD, hypothyroidism, hypomagnesemia, and paroxysmal atrial fibrillation who presents for consultation in the [facility]. Review of records show being seen by Dr. Ladona Ridgel in 2015 for history of SVT on propranolol and verapamil (prior to renal transplant). The patient was initially diagnosed with atrial fibrillation on 11/15/22 after presenting to [facility] ED with symptoms of dizziness and feeling funny. Currently taking verapamil SR 240 mg AM and 120 mg PM. Echocardiogram scheduled. Patient is on Eliquis 5 mg BID for a CHADS2VASC score of 5.  On evaluation 12/01/22, she is currently in Afib. She cannot really tell when she is in Afib. She has a pulse oximeter at home and not sure if it is working correctly. She denies any chest pain, palpitations, or shortness of breath. She missed a dose of Eliquis on 4/23. Her BP is elevated today in office but she shows me other BP recording of 110/70 on current medication regimen.   On follow up today, she is currently in Afib. As noted in last office visit, sometimes she can feel when she is in Afib due to palpitations but other times does not know. She does not feel tired or short of breath. Due to this, she wore a cardiac monitor to confirm whether she's paroxysmal or persistent. She has not missed any doses of Eliquis since last office visit.   Today, she denies symptoms of palpitations, chest pain, shortness of breath, orthopnea, PND, lower extremity edema, dizziness, presyncope, syncope, snoring, daytime somnolence, bleeding, or neurologic sequela. The patient is tolerating medications without difficulties and is otherwise without complaint today.   Atrial Fibrillation Risk  Factors:  she does not have symptoms or diagnosis of sleep apnea. she does not have a history of rheumatic fever. she does not have a history of alcohol use. The patient does not have a history of early familial atrial fibrillation or other arrhythmias.  she has a BMI of Body mass index is 38.54 kg/m.[name] Filed Weights   12/29/22 1328  Weight: 105.1 kg     Family History  Problem Relation Age of Onset   Deep vein thrombosis Mother        post thyroid surgery   Hypertension Mother    Heart attack Father 58       deceased   Hypertension Father    Cancer Sister        breast   Cancer Paternal Aunt        pancreatic   Diabetes Maternal Grandfather    Stroke Paternal Grandmother        in 58s   Fibroids Daughter    Colon cancer Neg Hx    Esophageal cancer Neg Hx    Stomach cancer Neg Hx    Rectal cancer Neg Hx    Sleep apnea Neg Hx      Atrial Fibrillation Management history:  Previous antiarrhythmic drugs: None Previous cardioversions: None Previous ablations: None Anticoagulation history: Eliquis 5 mg BID   Past Medical History:  Diagnosis Date   Anxiety    Arthritis    Cholelithiasis 04/2021   noted on CT abd/pelv done for R lower abd swelling and MR abd   Chronic renal insufficiency, stage III (moderate) (HCC) 10/2015   Post-transplant baseline sCr 1.2-1.4.  CMV infection [facility]) summer 2017   Valcyte per ID/Renal transplant team   Depression    Diverticulosis    a. 05/2012 colonoscopy   Erosive lichen planus of vulva    Topical steroids (managed by Dr. Robby Sermon [facility] via [facility] outpt services.   ESRD (end stage renal disease) (HCC)    began dialysis 2014--followed by Dr. Lowell Guitar.  Received deceased donor kidney transplant 10/2015.   Fatty liver 10/2021   with hepatomegaly   FSGS (focal segmental glomerulosclerosis)    right kidney; hx of left renal cell cancer and got nephrectomy 1993.   GERD (gastroesophageal reflux disease)     Gout    Heart murmur    (Diastolic) ECHO 08/2013 showed that this murmur is coming from pt's R arm AV fistula   Hiatal hernia    History of renal cell cancer 1993   Left nephrectomy   Hyperlipidemia    Hypertension    Hypothyroidism    Impingement syndrome of left shoulder 04/2016   Dr. Bertram Savin to PT   Kidney cancer, primary, with metastasis from kidney to other site [facility])    Lumbar spinal stenosis    Chronic LBP with bilat neurogenic claudication   Metatarsal fracture, pathologic 01/2018   Right; orthocarolina-->post op shoe continued, f/u x-ray planned.   Obesity    Osteoarthritis of left knee 08/2017   severe, diffuse, tricompartmental.  Responded to steroid injection.     Osteoporosis 06/07/2016   DEXA T-score of -3.1.  Fosamax planned 2017 but dental work prevented start..  Pathological toe fx 12/2017--repeat DEXA 08/2018, T-score -3.3 radius. 12/2020 T score radius -3.7.   Pneumonia    Renal transplant recipient 11-19-2015   Baseline Cr as of 09/2020 1.2-1.4   Simple hepatic cyst    2022   Solitary kidney, acquired    SVT (supraventricular tachycardia)    UTI (urinary tract infection)    Vitamin B12 deficiency 12/2018   Starting replacement as of 12/11/2018   Past Surgical History:  Procedure Laterality Date   AV FISTULA PLACEMENT Right    Right arm: aneurismal dilatation 2017 being followed by CV surgeons   CARDIOVASCULAR STRESS TEST     03/10/15 ETT ([facility]): Exercise ECG negative at 83% max predicted HR.    CATARACT EXTRACTION     COLONOSCOPY  2003; 05/2012   diverticulosis, no polyps.  Recall 10 yrs   colonoscopy with polypectomy     Dr Marina Goodell   DEXA  06/07/2016; 08/22/2018; 12/30/20   2017 T-score -3.1.  2020 T score -3.3.  2022 T score -3.7   HYSTERECTOMY ABDOMINAL WITH SALPINGECTOMY     KIDNEY TRANSPLANT  11-19-15   Deceased donor kidney transplant, with Thymoglobulin induction   LIGATION OF ARTERIOVENOUS  FISTULA Right 02/14/2017   Procedure:  LIGATION OF ARTERIOVENOUS  FISTULA;  Surgeon: Chuck Hint, MD;  Location: [facility] OR;  Service: Vascular;  Laterality: Right;   NEPHRECTOMY  1993   for malignancy- left   RENAL BIOPSY     right   RESECTION OF ARTERIOVENOUS FISTULA ANEURYSM Right 02/14/2017   Procedure: EXCISION OF RIGHT BRACHIOCEPHALIC ARTERIOVENOUS FISTULA ANEURYSM;  Surgeon: Chuck Hint, MD;  Location: [facility] OR;  Service: Vascular;  Laterality: Right;   TEE WITHOUT CARDIOVERSION N/A 08/27/2013   Procedure: TRANSESOPHAGEAL ECHOCARDIOGRAM (TEE);  Surgeon: Wendall Stade, MD;  Location[facility];  Service: Cardiovascular;  Laterality: N/A;   TOTAL ABDOMINAL HYSTERECTOMY W/ BILATERAL SALPINGOOPHORECTOMY  1997   fibroids   TRANSTHORACIC  ECHOCARDIOGRAM     03/10/15 echo ([facility]): LV cavity normal in size, focal basal hypertrophy, normal systolic function, EF 55% (visual est). Normal wall motion, no regional wall motion abnormalities. Mild diastolic dysfunction with normal LA chamber size. No significant valve stenosis or regurgitation.  12/2022 normal   VULVA / PERINEUM BIOPSY  2015    Current Outpatient Medications  Medication Sig Dispense Refill   Acetaminophen (TYLENOL 8 HOUR PO) Take 500 mg by mouth as needed.     apixaban (ELIQUIS) 5 MG TABS tablet Take 1 tablet (5 mg total) by mouth 2 (two) times daily. 180 tablet 1   AYR SALINE NASAL DROPS NA Place 2 sprays into both nostrils daily.     Camphor-Eucalyptus-Menthol (VICKS VAPORUB EX) Apply 1 application topically daily.     Cyanocobalamin (B-12 PO) Take 1 tablet by mouth daily.     doxazosin (CARDURA) 2 MG tablet Take 1 tablet (2 mg total) by mouth 2 (two) times daily. 180 tablet 0   levothyroxine (SYNTHROID) 125 MCG tablet 1/2 tab po on two days a week, whole tab all other days 90 tablet 3   LORazepam (ATIVAN) 0.5 MG tablet Take 1 tablet (0.5 mg total) by mouth 2 (two) times daily as needed for anxiety. 30 tablet 1   losartan  (COZAAR) 100 MG tablet Take 1 tablet (100 mg total) by mouth daily. 90 tablet 3   magnesium chloride (SLOW-MAG) 64 MG TBEC SR tablet Take 2 tablets (128 mg total) by mouth daily. (Patient taking differently: Take 3 tablets by mouth daily.) 60 tablet 1   mycophenolate (MYFORTIC) 360 MG TBEC EC tablet Take 360 mg by mouth 2 (two) times daily.     sodium bicarbonate 650 MG tablet Take 1 tablet (650 mg total) by mouth 2 (two) times daily. Takes 1 in AM and 1 at bedtime 180 tablet 3   tacrolimus (PROGRAF) 1 MG capsule Take 4 mg by mouth 2 (two) times daily.     triamcinolone cream (KENALOG) 0.1 % Apply 1 application topically daily.     verapamil (CALAN-SR) 120 MG CR tablet TAKE 1 TABLET BY MOUTH EVERY  AFTERNOON 90 tablet 2   verapamil (CALAN-SR) 240 MG CR tablet Take 1 tablet (240 mg total) by mouth every morning. 90 tablet 3   Vitamin D, Ergocalciferol, (DRISDOL) 1.25 MG (50000 UNIT) CAPS capsule Take 1 capsule (50,000 Units total) by mouth every 7 (seven) days. 12 capsule 1   No current facility-administered medications for this encounter.    Allergies  Allergen Reactions   Labetalol     Other reaction(s): Other   Cefaclor Rash   Naproxen Rash   Enalapril Maleate Cough    Vasotec   Metoprolol Tartrate Rash    On legs    Social History   Socioeconomic History   Marital status: Married    Spouse name: Not on file   Number of children: 2   Years of education: Not on file   Highest education level: 12th grade  Occupational History   Occupation: Retired  Tobacco Use   Smoking status: Never   Smokeless tobacco: Never  Vaping Use   Vaping Use: Never used  Substance and Sexual Activity   Alcohol use: Yes    Comment: rarely   Drug use: No   Sexual activity: Not on file  Other Topics Concern   Not on file  Social History Narrative   Lives in [place] with husband.  Retired.  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   No T/A/Ds.   Social Determinants of Health   Financial  Resource Strain: Low Risk  (09/14/2022)   Overall Financial Resource Strain (CARDIA)    Difficulty of Paying Living Expenses: Not hard at all  Food Insecurity: No Food Insecurity (09/14/2022)   Hunger Vital Sign    Worried About Running Out of Food in the Last Year: Never true    Ran Out of Food in the Last Year: Never true  Transportation Needs: No Transportation Needs (09/14/2022)   PRAPARE - Administrator, Civil Service (Medical): No    Lack of Transportation (Non-Medical): No  Physical Activity: Insufficiently Active (09/14/2022)   Exercise Vital Sign    Days of Exercise per Week: 2 days    Minutes of Exercise per Session: 20 min  Stress: Stress Concern Present (09/14/2022)   [employer] of Occupational Health - Occupational Stress Questionnaire    Feeling of Stress : To some extent  Social Connections: Moderately Integrated (09/14/2022)   Social Connection and Isolation Panel [NHANES]    Frequency of Communication with Friends and Family: More than three times a week    Frequency of Social Gatherings with Friends and Family: More than three times a week    Attends Religious Services: 1 to 4 times per year    Active Member of [employer] or Organizations: No    Attends Banker Meetings: Never    Marital Status: Married  Catering manager Violence: Not At Risk (09/14/2022)   Humiliation, Afraid, Rape, and Kick questionnaire    Fear of Current or Ex-Partner: No    Emotionally Abused: No    Physically Abused: No    Sexually Abused: No     ROS- All systems are reviewed and negative except as per the HPI above.  Physical Exam: Vitals:   12/29/22 1328  BP: 106/60  Pulse: 95  Weight: 105.1 kg    GEN- The patient is well appearing, alert and oriented x 3 today.   Head- normocephalic, atraumatic Eyes-  Sclera clear, conjunctiva pink Ears- hearing intact Lungs- Clear to ausculation bilaterally, normal work of breathing Heart- Irregular rate and rhythm, no  murmurs, rubs or gallops, PMI not laterally displaced Extremities- no clubbing, cyanosis, or edema MS- no significant deformity or atrophy Skin- no rash or lesion Psych- euthymic mood, full affect Neuro- strength and sensation are intact   Wt Readings from Last 3 Encounters:  12/29/22 105.1 kg  12/13/22 105.5 kg  12/01/22 105.1 kg    EKG today demonstrates  Vent. rate 95 BPM PR interval * ms QRS duration 88 ms QT/QTcB 374/469 ms P-R-T axes * 1 36 Atrial fibrillation Nonspecific ST abnormality Abnormal ECG When compared with ECG of 01-Dec-2022 10:24, PREVIOUS ECG IS PRESENT  Echo 12/28/22: 1. Left ventricular ejection fraction, by estimation, is 60 to 65%. The  left ventricle has normal function. The left ventricle has no regional  wall motion abnormalities. There is moderate left ventricular hypertrophy.  Left ventricular diastolic function   could not be evaluated.   2. Right ventricular systolic function is normal. The right ventricular  size is normal.   3. Left atrial size was moderately dilated.   4. The mitral valve is normal in structure. Trivial mitral valve  regurgitation. No evidence of mitral stenosis.   5. The aortic valve has an indeterminant number of cusps. Aortic valve  regurgitation is not visualized. No aortic stenosis is present.   6. The inferior  vena cava is normal in size with greater than 50%  respiratory variability, suggesting right atrial pressure of 3 mmHg.   Epic records are reviewed at length today.  CHA2DS2-VASc Score = 5  The patient's score is based upon: CHF History: 0 HTN History: 1 Diabetes History: 0 Stroke History: 0 Vascular Disease History: 1 Age Score: 2 Gender Score: 1       ASSESSMENT AND PLAN: Persistent Atrial Fibrillation (ICD10:  I48.0) The patient's CHA2DS2-VASc score is 5, indicating a 7.2% annual risk of stroke.    She is in Afib today.   I have asked EP to review her cardiac monitor - it states difficulty  discerning atrial activity. There are several rhythm strips that are labeled sinus rhythm but it is very difficult to see p waves.   Will schedule DCCV and plan to see her 1-2 weeks after. If she has ERAF, will consider AAD options vs ablation. She cannot take flecainide or amiodarone due to interaction with tacrolimus. Tikosyn with tacrolimus states to "monitor closely" so may require confirmation with EP.   2. Secondary Hypercoagulable State (ICD10:  D68.69) The patient is at significant risk for stroke/thromboembolism based upon her CHA2DS2-VASc Score of 5.  Continue Apixaban (Eliquis).   No missed doses.   3. Obesity Body mass index is 38.54 kg/m. Lifestyle modification was discussed at length including regular exercise and weight reduction. Encouraged daily walking as tolerated.  4. HTN Stable today, no changes.   Follow up 1-2 weeks after DCCV.   Lake Bells, PA-C Afib Clinic [facility] [address] [place], [place] [address] [phone] 12/29/2022 2:12 PM

## 2022-12-30 ENCOUNTER — Encounter: Payer: Self-pay | Admitting: Family Medicine

## 2023-01-02 ENCOUNTER — Encounter: Payer: Self-pay | Admitting: Family Medicine

## 2023-01-03 ENCOUNTER — Telehealth (HOSPITAL_COMMUNITY): Payer: Self-pay | Admitting: *Deleted

## 2023-01-03 NOTE — Telephone Encounter (Signed)
Pt called in stating according to her kardia device she is going in and out of afib this weekend and would like her cardioversion canceled. Discussed with Landry Mellow PA will cancel cardioversion and follow up as scheduled to discuss AAD. Pt in agreement.

## 2023-01-03 NOTE — Telephone Encounter (Signed)
Please see other detailed mychart message pt sent.

## 2023-01-04 ENCOUNTER — Ambulatory Visit (HOSPITAL_COMMUNITY): Admission: RE | Admit: 2023-01-04 | Payer: Medicare Other | Source: Home / Self Care | Admitting: Cardiovascular Disease

## 2023-01-04 ENCOUNTER — Encounter (HOSPITAL_COMMUNITY): Admission: RE | Payer: Self-pay | Source: Home / Self Care

## 2023-01-04 SURGERY — CARDIOVERSION
Anesthesia: Monitor Anesthesia Care

## 2023-01-10 ENCOUNTER — Ambulatory Visit: Payer: Medicare Other | Admitting: Family Medicine

## 2023-01-11 ENCOUNTER — Ambulatory Visit (INDEPENDENT_AMBULATORY_CARE_PROVIDER_SITE_OTHER): Payer: Medicare Other | Admitting: Family Medicine

## 2023-01-11 ENCOUNTER — Encounter: Payer: Self-pay | Admitting: Family Medicine

## 2023-01-11 VITALS — BP 112/70 | HR 62 | Ht 65.0 in | Wt 234.4 lb

## 2023-01-11 DIAGNOSIS — E039 Hypothyroidism, unspecified: Secondary | ICD-10-CM | POA: Diagnosis not present

## 2023-01-11 DIAGNOSIS — R7989 Other specified abnormal findings of blood chemistry: Secondary | ICD-10-CM

## 2023-01-11 DIAGNOSIS — I480 Paroxysmal atrial fibrillation: Secondary | ICD-10-CM

## 2023-01-11 NOTE — Progress Notes (Signed)
OFFICE VISIT  01/11/2023  CC:  Chief Complaint  Patient presents with   Medical Management of Chronic Issues    Patient is a 77 y.o. female who presents for 1 month follow-up A-fib, hypothyroidism, and blood pressures. A/P as of last visit: "#1 A-fib, doing well on Eliquis and Calan SR to 240 every morning and 120 every afternoon. She is established with A-fib clinic. She has long-term monitor on to determine A-fib burden. She is largely asymptomatic.   #2 hypomagnesemia.  Increased dose to 3 tabs per day on 11/28/2022. Plan recheck magnesium level in 1 week.   #3 hypothyroidism. However, TSH on 11/20/2022 was suppressed --- 0.058.  As of 11/21/22, she is taking one half of 125 mcg tab on 2 days a week and a whole tab on all other days.  TSH recheck in 1 week."  INTERIM HX: No palpitations, racing heart, CP, dizziness, or SOB. No bleeding.  Most recent A-fib clinic visit 12/29/2022: Plan was to set her up for DCCV.  She canceled this plan, though, because her Kardia device signaled that she was going in and out of A-fib all weekend. Her repeat TSH and magnesium levels were normal on 12/19/2022.  Past Medical History:  Diagnosis Date   Anxiety    Arthritis    Cholelithiasis 04/2021   noted on CT abd/pelv done for R lower abd swelling and MR abd   Chronic renal insufficiency, stage III (moderate) (HCC) 10/2015   Post-transplant baseline sCr 1.2-1.4.     CMV infection [facility]) summer 2017   Valcyte per ID/Renal transplant team   Depression    Diverticulosis    a. 05/2012 colonoscopy   Erosive lichen planus of vulva    Topical steroids (managed by Dr. Robby Sermon [facility] via [facility] out[facility].   ESRD (end stage renal disease) (HCC)    began dialysis 2014--followed by Dr. Lowell Guitar.  Received deceased donor kidney transplant 10/2015.   Fatty liver 10/2021   with hepatomegaly   FSGS (focal segmental glomerulosclerosis)    right kidney; hx of left renal cell  cancer and got nephrectomy 1993.   GERD (gastroesophageal reflux disease)    Gout    Heart murmur    (Diastolic) ECHO 08/2013 showed that this murmur is coming from pt's R arm AV fistula   Hiatal hernia    History of renal cell cancer 1993   Left nephrectomy   Hyperlipidemia    Hypertension    Hypothyroidism    Impingement syndrome of left shoulder 04/2016   Dr. Bertram Savin to PT   Kidney cancer, primary, with metastasis from kidney to other site [facility])    Lumbar spinal stenosis    Chronic LBP with bilat neurogenic claudication   Metatarsal fracture, pathologic 01/2018   Right; orthocarolina-->post op shoe continued, f/u x-ray planned.   Obesity    Osteoarthritis of left knee 08/2017   severe, diffuse, tricompartmental.  Responded to steroid injection.     Osteoporosis 06/07/2016   DEXA T-score of -3.1.  Fosamax planned 2017 but dental work prevented start..  Pathological toe fx 12/2017--repeat DEXA 08/2018, T-score -3.3 radius. 12/2020 T score radius -3.7.   Pneumonia    Renal transplant recipient 11/06/2015   Baseline Cr as of 09/2020 1.2-1.4   Simple hepatic cyst    2022   Solitary kidney, acquired    SVT (supraventricular tachycardia)    UTI (urinary tract infection)    Vitamin B12 deficiency 12/2018   Starting replacement as of 12/11/2018  Past Surgical History:  Procedure Laterality Date   AV FISTULA PLACEMENT Right    Right arm: aneurismal dilatation 2017 being followed by CV surgeons   CARDIOVASCULAR STRESS TEST     03/10/15 ETT ([facility]): Exercise ECG negative at 83% max predicted HR.    CATARACT EXTRACTION     COLONOSCOPY  2003; 05/2012   diverticulosis, no polyps.  Recall 10 yrs   colonoscopy with polypectomy     Dr Marina Goodell   DEXA  06/07/2016; 08/22/2018; 12/30/20   2017 T-score -3.1.  2020 T score -3.3.  2022 T score -3.7   HYSTERECTOMY ABDOMINAL WITH SALPINGECTOMY     KIDNEY TRANSPLANT  11/27/2015   Deceased donor kidney transplant, with Thymoglobulin  induction   LIGATION OF ARTERIOVENOUS  FISTULA Right 02/14/2017   Procedure: LIGATION OF ARTERIOVENOUS  FISTULA;  Surgeon: Chuck Hint, MD;  Location: [facility] OR;  Service: Vascular;  Laterality: Right;   NEPHRECTOMY  1993   for malignancy- left   RENAL BIOPSY     right   RESECTION OF ARTERIOVENOUS FISTULA ANEURYSM Right 02/14/2017   Procedure: EXCISION OF RIGHT BRACHIOCEPHALIC ARTERIOVENOUS FISTULA ANEURYSM;  Surgeon: Chuck Hint, MD;  Location: [facility] OR;  Service: Vascular;  Laterality: Right;   TEE WITHOUT CARDIOVERSION N/A 08/27/2013   Procedure: TRANSESOPHAGEAL ECHOCARDIOGRAM (TEE);  Surgeon: Wendall Stade, MD;  Location: [facility] ENDOSCOPY;  Service: Cardiovascular;  Laterality: N/A;   TOTAL ABDOMINAL HYSTERECTOMY W/ BILATERAL SALPINGOOPHORECTOMY  1997   fibroids   TRANSTHORACIC ECHOCARDIOGRAM     03/10/15 echo ([facility]): LV cavity normal in size, focal basal hypertrophy, normal systolic function, EF 55% (visual est). Normal wall motion, no regional wall motion abnormalities. Mild diastolic dysfunction with normal LA chamber size. No significant valve stenosis or regurgitation.  12/2022 normal   VULVA / PERINEUM BIOPSY  2015    Outpatient Medications Prior to Visit  Medication Sig Dispense Refill   acetaminophen (TYLENOL) 500 MG tablet Take 1,000 mg by mouth every 8 (eight) hours as needed for moderate pain.     apixaban (ELIQUIS) 5 MG TABS tablet Take 1 tablet (5 mg total) by mouth 2 (two) times daily. 180 tablet 1   AYR SALINE NASAL DROPS NA Place 2 sprays into both nostrils daily.     Camphor-Eucalyptus-Menthol (VICKS VAPORUB EX) Apply 1 application topically daily.     Cyanocobalamin (B-12 PO) Take 1 tablet by mouth daily.     doxazosin (CARDURA) 2 MG tablet Take 1 tablet (2 mg total) by mouth 2 (two) times daily. 180 tablet 0   levothyroxine (SYNTHROID) 125 MCG tablet 1/2 tab po on two days a week, whole tab all other days (Patient taking differently:  Take 62.5-125 mcg by mouth See admin instructions. 1/2 tab po on two days a week Wed and Sat, and whole tab all other days) 90 tablet 3   LORazepam (ATIVAN) 0.5 MG tablet Take 1 tablet (0.5 mg total) by mouth 2 (two) times daily as needed for anxiety. 30 tablet 1   losartan (COZAAR) 100 MG tablet Take 1 tablet (100 mg total) by mouth daily. 90 tablet 3   magnesium chloride (SLOW-MAG) 64 MG TBEC SR tablet Take 2 tablets (128 mg total) by mouth daily. (Patient taking differently: Take 3 tablets by mouth daily.) 60 tablet 1   mycophenolate (MYFORTIC) 360 MG TBEC EC tablet Take 360 mg by mouth 2 (two) times daily.     sodium bicarbonate 650 MG tablet Take 1 tablet (650  mg total) by mouth 2 (two) times daily. Takes 1 in AM and 1 at bedtime 180 tablet 3   tacrolimus (PROGRAF) 1 MG capsule Take 2 mg by mouth 2 (two) times daily.     triamcinolone cream (KENALOG) 0.1 % Apply 1 application topically daily.     verapamil (CALAN-SR) 120 MG CR tablet TAKE 1 TABLET BY MOUTH EVERY  AFTERNOON 90 tablet 2   verapamil (CALAN-SR) 240 MG CR tablet Take 1 tablet (240 mg total) by mouth every morning. 90 tablet 3   Vitamin D, Ergocalciferol, (DRISDOL) 1.25 MG (50000 UNIT) CAPS capsule Take 1 capsule (50,000 Units total) by mouth every 7 (seven) days. 12 capsule 1   No facility-administered medications prior to visit.    Allergies  Allergen Reactions   Labetalol     Patient couldn't walk, low BP   Ceclor [Cefaclor] Rash   Naproxen Rash   Enalapril Maleate Cough    Vasotec   Metoprolol Tartrate Rash    On legs    Review of Systems As per HPI  PE:    01/11/2023    1:12 PM 01/11/2023    1:03 PM 12/29/2022    1:28 PM  Vitals with BMI  Height  5\' 5"    Weight  234 lbs 6 oz 231 lbs 10 oz  BMI  39.01   Systolic 112 148 161  Diastolic 70 57 60  Pulse  62 95   Initial bp today 148/57 Rpt manually was 112/70   Physical Exam  Gen: Alert, well appearing.  Patient is oriented to person, place, time, and  situation. AFFECT: pleasant, lucid thought and speech. CV: RRR, no m/r/g.  Rate 60-65 LUNGS: CTA bilat, nonlabored resps, good aeration in all lung fields.   LABS:  Last CBC Lab Results  Component Value Date   WBC 7.7 12/29/2022   HGB 13.2 12/29/2022   HCT 40.6 12/29/2022   MCV 88.3 12/29/2022   MCH 28.7 12/29/2022   RDW 13.2 12/29/2022   PLT 231 12/29/2022   Last metabolic panel Lab Results  Component Value Date   GLUCOSE 106 (H) 12/29/2022   NA 139 12/29/2022   K 3.9 12/29/2022   CL 108 12/29/2022   CO2 23 12/29/2022   BUN 15 12/29/2022   CREATININE 1.00 12/29/2022   GFRNONAA 58 (L) 12/29/2022   CALCIUM 9.5 12/29/2022   PHOS 2.9 01/04/2018   PROT 6.6 11/20/2022   ALBUMIN 3.4 (L) 11/20/2022   BILITOT 0.6 11/20/2022   ALKPHOS 65 11/20/2022   AST 18 11/20/2022   ALT 13 11/20/2022   ANIONGAP 8 12/29/2022   Last thyroid functions Lab Results  Component Value Date   TSH 4.36 12/19/2022   IMPRESSION AND PLAN:  #1 paroxysmal atrial fibrillation.  DCCV was planned but she changed her mind. Asymptomatic. Regular rhythm today. Continue verapamil SR 240 mg every morning and 120 mg every afternoon and continue Eliquis 5 mg twice a day. She has follow-up appointment with A-fib provider on 01/16/2023 and her primary cardiologist on 02/16/2023.  #2 hypothyroidism. TSH was over suppressed the last couple of months but was back into normal range 3 weeks ago.  Cont one half of 125 mcg tab on 2 days a week and a whole tab on all other days. Recheck TSH next o/v in 1 mo.  An After Visit Summary was printed and given to the patient.  FOLLOW UP: Return in about 4 weeks (around 02/08/2023) for routine chronic illness f/u.  Signed:  Michele Mcalpine  Katlen Seyer, MD           01/11/2023

## 2023-01-16 ENCOUNTER — Ambulatory Visit (HOSPITAL_COMMUNITY)
Admission: RE | Admit: 2023-01-16 | Discharge: 2023-01-16 | Disposition: A | Discharge status: Home / Self Care | Payer: Medicare Other | Source: Ambulatory Visit | Attending: Internal Medicine | Admitting: Internal Medicine

## 2023-01-16 ENCOUNTER — Encounter (HOSPITAL_COMMUNITY): Payer: Self-pay | Admitting: Internal Medicine

## 2023-01-16 VITALS — BP 126/76 | HR 67 | Ht 65.0 in | Wt 232.4 lb

## 2023-01-16 DIAGNOSIS — Z6838 Body mass index (BMI) 38.0-38.9, adult: Secondary | ICD-10-CM | POA: Diagnosis not present

## 2023-01-16 DIAGNOSIS — E669 Obesity, unspecified: Secondary | ICD-10-CM | POA: Insufficient documentation

## 2023-01-16 DIAGNOSIS — I480 Paroxysmal atrial fibrillation: Secondary | ICD-10-CM | POA: Insufficient documentation

## 2023-01-16 DIAGNOSIS — Z7901 Long term (current) use of anticoagulants: Secondary | ICD-10-CM | POA: Insufficient documentation

## 2023-01-16 DIAGNOSIS — I10 Essential (primary) hypertension: Secondary | ICD-10-CM | POA: Insufficient documentation

## 2023-01-16 DIAGNOSIS — D6869 Other thrombophilia: Secondary | ICD-10-CM | POA: Insufficient documentation

## 2023-01-16 NOTE — Progress Notes (Addendum)
Primary Care Physician: Jeoffrey Massed, MD Primary Cardiologist: None Primary Electrophysiologist: None Referring Physician: Dr. Charlena Cross is a 77 y.o. female with a history of renal transplant, chronic renal insufficiency stage III, HTN, HLD, hypothyroidism, hypomagnesemia, and paroxysmal atrial fibrillation who presents for consultation in the [facility]. Review of records show being seen by Dr. Ladona Ridgel in 2015 for history of SVT on propranolol and verapamil (prior to renal transplant). The patient was initially diagnosed with atrial fibrillation on 11/15/22 after presenting to [facility] ED with symptoms of dizziness and feeling funny. Currently taking verapamil SR 240 mg AM and 120 mg PM. Echocardiogram scheduled. Patient is on Eliquis 5 mg BID for a CHADS2VASC score of 5.  On evaluation 12/01/22, she is currently in Afib. She cannot really tell when she is in Afib. She has a pulse oximeter at home and not sure if it is working correctly. She denies any chest pain, palpitations, or shortness of breath. She missed a dose of Eliquis on 4/23. Her BP is elevated today in office but she shows me other BP recording of 110/70 on current medication regimen.   On follow up today, she is currently in Afib. As noted in last office visit, sometimes she can feel when she is in Afib due to palpitations but other times does not know. She does not feel tired or short of breath. Due to this, she wore a cardiac monitor to confirm whether she's paroxysmal or persistent. She has not missed any doses of Eliquis since last office visit.   On follow up 01/16/23, she is currently in NSR. She was scheduled for DCCV previously but cancelled procedure due to [facility] device noting she was paroxysmal. She has not missed any doses of Eliquis 5 mg BID. Over the past few weeks, she has noted via [employer] device she has most often been in NSR and had very infrequent episodes of  Afib. She overall feels well and even during Afib episodes does not seem to have cardiac awareness. No missed doses of Eliquis.   Today, she denies symptoms of palpitations, chest pain, shortness of breath, orthopnea, PND, lower extremity edema, dizziness, presyncope, syncope, snoring, daytime somnolence, bleeding, or neurologic sequela. The patient is tolerating medications without difficulties and is otherwise without complaint today.   Atrial Fibrillation Risk Factors:  she does not have symptoms or diagnosis of sleep apnea. she does not have a history of rheumatic fever. she does not have a history of alcohol use. The patient does not have a history of early familial atrial fibrillation or other arrhythmias.  she has a BMI of Body mass index is 38.67 kg/m.[name] Filed Weights   01/16/23 1415  Weight: 105.4 kg      Family History  Problem Relation Age of Onset   Deep vein thrombosis Mother        post thyroid surgery   Hypertension Mother    Heart attack Father 38       deceased   Hypertension Father    Cancer Sister        breast   Cancer Paternal Aunt        pancreatic   Diabetes Maternal Grandfather    Stroke Paternal Grandmother        in 53s   Fibroids Daughter    Colon cancer Neg Hx    Esophageal cancer Neg Hx    Stomach cancer Neg Hx    Rectal cancer Neg Hx  Sleep apnea Neg Hx      Atrial Fibrillation Management history:  Previous antiarrhythmic drugs: None Previous cardioversions: None Previous ablations: None Anticoagulation history: Eliquis 5 mg BID   Past Medical History:  Diagnosis Date   Anxiety    Arthritis    Cholelithiasis 04/2021   noted on CT abd/pelv done for R lower abd swelling and MR abd   Chronic renal insufficiency, stage III (moderate) (HCC) 10/2015   Post-transplant baseline sCr 1.2-1.4.     CMV infection [facility]) summer 2017   Valcyte per ID/Renal transplant team   Depression    Diverticulosis    a. 05/2012 colonoscopy   Erosive  lichen planus of vulva    Topical steroids (managed by Dr. Robby Sermon [facility] via [facility].   ESRD (end stage renal disease) (HCC)    began dialysis 2014--followed by Dr. Lowell Guitar.  Received deceased donor kidney transplant 10/2015.   Fatty liver 10/2021   with hepatomegaly   FSGS (focal segmental glomerulosclerosis)    right kidney; hx of left renal cell cancer and got nephrectomy 1993.   GERD (gastroesophageal reflux disease)    Gout    Heart murmur    (Diastolic) ECHO 08/2013 showed that this murmur is coming from pt's R arm AV fistula   Hiatal hernia    History of renal cell cancer 1993   Left nephrectomy   Hyperlipidemia    Hypertension    Hypothyroidism    Impingement syndrome of left shoulder 04/2016   Dr. Bertram Savin to PT   Kidney cancer, primary, with metastasis from kidney to other site [facility])    Lumbar spinal stenosis    Chronic LBP with bilat neurogenic claudication   Metatarsal fracture, pathologic 01/2018   Right; orthocarolina-->post op shoe continued, f/u x-ray planned.   Obesity    Osteoarthritis of left knee 08/2017   severe, diffuse, tricompartmental.  Responded to steroid injection.     Osteoporosis 06/07/2016   DEXA T-score of -3.1.  Fosamax planned 2017 but dental work prevented start..  Pathological toe fx 12/2017--repeat DEXA 08/2018, T-score -3.3 radius. 12/2020 T score radius -3.7.   Pneumonia    Renal transplant recipient 2015-11-26   Baseline Cr as of 09/2020 1.2-1.4   Simple hepatic cyst    2022   Solitary kidney, acquired    SVT (supraventricular tachycardia)    UTI (urinary tract infection)    Vitamin B12 deficiency 12/2018   Starting replacement as of 12/11/2018   Past Surgical History:  Procedure Laterality Date   AV FISTULA PLACEMENT Right    Right arm: aneurismal dilatation 2017 being followed by CV surgeons   CARDIOVASCULAR STRESS TEST     03/10/15 ETT ([facility]): Exercise ECG negative at 83% max  predicted HR.    CATARACT EXTRACTION     COLONOSCOPY  2003; 05/2012   diverticulosis, no polyps.  Recall 10 yrs   colonoscopy with polypectomy     Dr Marina Goodell   DEXA  06/07/2016; 08/22/2018; 12/30/20   2017 T-score -3.1.  2020 T score -3.3.  2022 T score -3.7   HYSTERECTOMY ABDOMINAL WITH SALPINGECTOMY     KIDNEY TRANSPLANT  26-Nov-2015   Deceased donor kidney transplant, with Thymoglobulin induction   LIGATION OF ARTERIOVENOUS  FISTULA Right 02/14/2017   Procedure: LIGATION OF ARTERIOVENOUS  FISTULA;  Surgeon: Chuck Hint, MD;  [facility];  Service: Vascular;  Laterality: Right;   NEPHRECTOMY  1993   for malignancy- left   RENAL BIOPSY  right   RESECTION OF ARTERIOVENOUS FISTULA ANEURYSM Right 02/14/2017   Procedure: EXCISION OF RIGHT BRACHIOCEPHALIC ARTERIOVENOUS FISTULA ANEURYSM;  Surgeon: Chuck Hint, MD;  Location: [facility] OR;  Service: Vascular;  Laterality: Right;   TEE WITHOUT CARDIOVERSION N/A 08/27/2013   Procedure: TRANSESOPHAGEAL ECHOCARDIOGRAM (TEE);  Surgeon: Wendall Stade, MD;  Location: [facility] ENDOSCOPY;  Service: Cardiovascular;  Laterality: N/A;   TOTAL ABDOMINAL HYSTERECTOMY W/ BILATERAL SALPINGOOPHORECTOMY  1997   fibroids   TRANSTHORACIC ECHOCARDIOGRAM     03/10/15 echo ([facility]): LV cavity normal in size, focal basal hypertrophy, normal systolic function, EF 55% (visual est). Normal wall motion, no regional wall motion abnormalities. Mild diastolic dysfunction with normal LA chamber size. No significant valve stenosis or regurgitation.  12/2022 normal   VULVA / PERINEUM BIOPSY  2015    Current Outpatient Medications  Medication Sig Dispense Refill   acetaminophen (TYLENOL) 500 MG tablet Take 1,000 mg by mouth every 8 (eight) hours as needed for moderate pain.     apixaban (ELIQUIS) 5 MG TABS tablet Take 1 tablet (5 mg total) by mouth 2 (two) times daily. 180 tablet 1   AYR SALINE NASAL DROPS NA Place 2 sprays into both nostrils  daily.     Camphor-Eucalyptus-Menthol (VICKS VAPORUB EX) Apply 1 application topically daily.     Cyanocobalamin (B-12 PO) Take 1 tablet by mouth daily.     doxazosin (CARDURA) 2 MG tablet Take 1 tablet (2 mg total) by mouth 2 (two) times daily. 180 tablet 0   levothyroxine (SYNTHROID) 125 MCG tablet 1/2 tab po on two days a week, whole tab all other days (Patient taking differently: Take 62.5-125 mcg by mouth See admin instructions. 1/2 tab po on two days a week Wed and Sat, and whole tab all other days) 90 tablet 3   LORazepam (ATIVAN) 0.5 MG tablet Take 1 tablet (0.5 mg total) by mouth 2 (two) times daily as needed for anxiety. 30 tablet 1   losartan (COZAAR) 100 MG tablet Take 1 tablet (100 mg total) by mouth daily. 90 tablet 3   magnesium chloride (SLOW-MAG) 64 MG TBEC SR tablet Take 2 tablets (128 mg total) by mouth daily. (Patient taking differently: Take 3 tablets by mouth daily.) 60 tablet 1   mycophenolate (MYFORTIC) 360 MG TBEC EC tablet Take 360 mg by mouth 2 (two) times daily.     sodium bicarbonate 650 MG tablet Take 1 tablet (650 mg total) by mouth 2 (two) times daily. Takes 1 in AM and 1 at bedtime 180 tablet 3   tacrolimus (PROGRAF) 1 MG capsule Take 2 mg by mouth 2 (two) times daily.     triamcinolone cream (KENALOG) 0.1 % Apply 1 application topically daily.     verapamil (CALAN-SR) 120 MG CR tablet TAKE 1 TABLET BY MOUTH EVERY  AFTERNOON 90 tablet 2   verapamil (CALAN-SR) 240 MG CR tablet Take 1 tablet (240 mg total) by mouth every morning. 90 tablet 3   Vitamin D, Ergocalciferol, (DRISDOL) 1.25 MG (50000 UNIT) CAPS capsule Take 1 capsule (50,000 Units total) by mouth every 7 (seven) days. 12 capsule 1   No current facility-administered medications for this encounter.    Allergies  Allergen Reactions   Labetalol     Patient couldn't walk, low BP   Ceclor [Cefaclor] Rash   Naproxen Rash   Enalapril Maleate Cough    Vasotec   Metoprolol Tartrate Rash    On legs     Social  History   Socioeconomic History   Marital status: Married    Spouse name: Not on file   Number of children: 2   Years of education: Not on file   Highest education level: 12th grade  Occupational History   Occupation: Retired  Tobacco Use   Smoking status: Never   Smokeless tobacco: Never  Vaping Use   Vaping Use: Never used  Substance and Sexual Activity   Alcohol use: Yes    Comment: rarely   Drug use: No   Sexual activity: Not on file  Other Topics Concern   Not on file  Social History Narrative   Lives in [place] with husband.  Retired.  Previously worked in [employer] @ [employer].   No T/A/Ds.   Social Determinants of Health   Financial Resource Strain: Low Risk  (09/14/2022)   Overall Financial Resource Strain (CARDIA)    Difficulty of Paying Living Expenses: Not hard at all  Food Insecurity: No Food Insecurity (09/14/2022)   Hunger Vital Sign    Worried About Running Out of Food in the Last Year: Never true    Ran Out of Food in the Last Year: Never true  Transportation Needs: No Transportation Needs (09/14/2022)   PRAPARE - Administrator, Civil Service (Medical): No    Lack of Transportation (Non-Medical): No  Physical Activity: Insufficiently Active (09/14/2022)   Exercise Vital Sign    Days of Exercise per Week: 2 days    Minutes of Exercise per Session: 20 min  Stress: Stress Concern Present (09/14/2022)   [employer] of Occupational Health - Occupational Stress Questionnaire    Feeling of Stress : To some extent  Social Connections: Moderately Integrated (09/14/2022)   Social Connection and Isolation Panel [NHANES]    Frequency of Communication with Friends and Family: More than three times a week    Frequency of Social Gatherings with Friends and Family: More than three times a week    Attends Religious Services: 1 to 4 times per year    Active Member of [employer] or Organizations: No    Attends Banker Meetings: Never     Marital Status: Married  Catering manager Violence: Not At Risk (09/14/2022)   Humiliation, Afraid, Rape, and Kick questionnaire    Fear of Current or Ex-Partner: No    Emotionally Abused: No    Physically Abused: No    Sexually Abused: No     ROS- All systems are reviewed and negative except as per the HPI above.  Physical Exam: Vitals:   01/16/23 1415  BP: 126/76  Pulse: 67  Weight: 105.4 kg  Height: 5\' 5"  (1.651 m)    GEN- The patient is well appearing, alert and oriented x 3 today.   Head- normocephalic, atraumatic Eyes-  Sclera clear, conjunctiva pink Ears- hearing intact Lungs- Clear to ausculation bilaterally, normal work of breathing Heart- Regular rate and rhythm, no murmurs, rubs or gallops, PMI not laterally displaced Extremities- no clubbing, cyanosis, or edema MS- no significant deformity or atrophy Skin- no rash or lesion Psych- euthymic mood, full affect Neuro- strength and sensation are intact  Wt Readings from Last 3 Encounters:  01/16/23 105.4 kg  01/11/23 106.3 kg  12/29/22 105.1 kg    EKG today demonstrates  Vent. rate 67 BPM PR interval 240 ms QRS duration 92 ms QT/QTcB 410/433 ms P-R-T axes -17 -9 36 Sinus rhythm with 1st degree A-V block Minimal voltage criteria for LVH, may be normal variant (  R in aVL ) Borderline ECG When compared with ECG of 29-Dec-2022 13:38, PREVIOUS ECG IS PRESENT  Echo 12/28/22: 1. Left ventricular ejection fraction, by estimation, is 60 to 65%. The  left ventricle has normal function. The left ventricle has no regional  wall motion abnormalities. There is moderate left ventricular hypertrophy.  Left ventricular diastolic function   could not be evaluated.   2. Right ventricular systolic function is normal. The right ventricular  size is normal.   3. Left atrial size was moderately dilated.   4. The mitral valve is normal in structure. Trivial mitral valve  regurgitation. No evidence of mitral stenosis.   5.  The aortic valve has an indeterminant number of cusps. Aortic valve  regurgitation is not visualized. No aortic stenosis is present.   6. The inferior vena cava is normal in size with greater than 50%  respiratory variability, suggesting right atrial pressure of 3 mmHg.   Epic records are reviewed at length today.  CHA2DS2-VASc Score = 5  The patient's score is based upon: CHF History: 0 HTN History: 1 Diabetes History: 0 Stroke History: 0 Vascular Disease History: 1 Age Score: 2 Gender Score: 1      ASSESSMENT AND PLAN: Paroxysmal Atrial Fibrillation (ICD10:  I48.0) The patient's CHA2DS2-VASc score is 5, indicating a 7.2% annual risk of stroke.    She is in NSR.  We have discussed options going forward for management of Afib. These options include current rate control, Tikosyn as an AAD, or ablation as a procedure. She cannot take Multaq or flecainide or amiodarone due to interaction with tacrolimus. Tikosyn with tacrolimus states to "monitor closely" so would require review with pharmacy. After discussion, she wishes to continue with conservative observation which is reasonable given recent low burden.   Continue verapamil 240 mg AM 120 mg PM.  2. Secondary Hypercoagulable State (ICD10:  D68.69) The patient is at significant risk for stroke/thromboembolism based upon her CHA2DS2-VASc Score of 5.  Continue Apixaban (Eliquis).   No missed doses.   3. Obesity Body mass index is 38.67 kg/m. Lifestyle modification was discussed at length including regular exercise and weight reduction. Encouraged daily walking as tolerated.  4. HTN Stable today, no changes.   Follow up 3 months to reassess burden.   Lake Bells, PA-C Afib Clinic [facility] [address] [place], [place] [address] [phone] 01/16/2023 3:10 PM

## 2023-01-17 ENCOUNTER — Telehealth: Payer: Self-pay | Admitting: Internal Medicine

## 2023-01-17 NOTE — Telephone Encounter (Signed)
Left message for pt to call back.  Pt states she has recently had some BRBPR. Pt states it has only happened once. Pt scheduled to see Willette Cluster NP 01/23/23 at 9am. Pt aware of appt.

## 2023-01-17 NOTE — Telephone Encounter (Signed)
Inbound call from patient requesting to speak with a nurse in regards to her having rectal bleeding. Please advise.  Thank you

## 2023-01-19 ENCOUNTER — Other Ambulatory Visit: Payer: Self-pay | Admitting: Family Medicine

## 2023-01-19 NOTE — Progress Notes (Signed)
01/23/2023 Tabitha Green [id number] 1946-05-08  Referring provider: Jeoffrey Massed, MD Primary GI doctor: Dr. Marina Goodell  ASSESSMENT AND PLAN:   Rectal bleeding No anemia, + FOBT Last colon 2013 with diverticulosis Most recent bleeding more consistent with hemorrhoids, not appreciated on exam, will treat with steroid cream and better bowel habits With rebleeding, on eliquis now, and FOBT + suggest proceeding with colonoscopy to rule out malignancy, patient agreeable.  Should be appropriate at [facility] with Dr. Marina Goodell, will need to hold eliquis We have discussed the risks of bleeding, infection, perforation, medication reactions, and remote risk of death associated with colonoscopy. All questions were answered and the patient acknowledges these risk and wishes to proceed.  Paroxysmal atrial fibrillation (HCC) New onset 11/2022 Echo 12/2022 EF 60-65% without valve issues or wall most abnormalities Has follow up Dr. Eden Emms 02/2023 Patient told to hold her Eliquis for 2 days prior to time of procedure. Will instruct when and how to resume after procedure. We will communicate with her prescribing physician Dr. Eden Emms to ensure that holding his Eliquis is acceptable. We discussed the risk, benefits and alternatives to colonoscopy/endoscopy.  We also discussed the low but real risk of cardiovascular event such as heart attack, stroke, embolism, thrombosis or ischemia/infarct of other organs off Eliquis and explained the need to seek urgent help if this occurs.  She is agreeable and wishes to proceed.  Ventral hernia without obstruction or gangrene Discussed with patient No evidence of stragulation or issues and with size, doubt this will happen. Suggest weight loss, can try AB binder  Morbid obesity (HCC) Body mass index is 38.67 kg/m.  -Patient has been advised to make an attempt to improve diet and exercise patterns to aid in weight loss. -Recommended diet heavy in fruits and veggies and  low in animal meats, cheeses, and dairy products, appropriate calorie intake   Patient Care Team: Jeoffrey Massed, MD as PCP - General (Family Medicine) Hilarie Fredrickson, MD as Consulting Physician (Gastroenterology) Lenda Kelp, MD as Consulting Physician (Sports Medicine) Sherren Kerns, MD (Inactive) as Consulting Physician (Vascular Surgery) Chuck Hint, MD as Consulting Physician (Vascular Surgery) Malvin Johns as Physician Assistant (Orthopedic Surgery) P[facility]ardo[facility], Robby Sermon, MD as Consulting Physician (Dermatology) Tomi Bamberger, PA-C as Consulting Physician (Orthopedic Surgery) Tyler Pita, MD as Consulting Physician (Nephrology) Monica Becton, MD as Consulting Physician (Family Medicine) Cain Saupe, MD as Referring Physician (Ophthalmology) Ebony Cargo, MD as Consulting Physician (Transplant) Wendall Stade, MD as Consulting Physician (Cardiology)  HISTORY OF PRESENT ILLNESS: 77 y.o. female with a past medical history of end-stage renal disease was on dialysis since 2014 status post renal cell transplant 2017, CMV infection, cholelithiasis, diverticulosis, fatty liver, GERD, B12 deficiency, atrial fibrillation diagnosed 11/2022 on Eliquis 5 mg twice daily and others listed below presents for evaluation of BRB.   June 07, 2012 colonoscopy for history of colon polyps. She was found to have severe diverticulosis throughout the entire colon.  07/13/2022 office visit Dr. Marina Goodell for painless rectal bleeding was set up for colonoscopy 09/02/2022 but this was canceled, States her husband was sick at that time so she had to cancel.  11/2022 diagnosed with atrial fibrillation currently on Eliquis 5 mg twice daily ,has been going to Afib clinic, has follow up Dr. Eden Emms in July.  She states she is in and out of Afib, thinks she can feel when she is in it, she will feel nervous, feels she is in  it today.  12/28/22 Echo EF 60-65%, normal  valves 12/29/2022 hemoglobin 13.2, MCV 88.3, normal platelets normal WBC  She had one other episode of rectal bleeding, just on TP. No rectal pain.  She has a BM most days, every other day.  Most of the time easy with out straining, occ straining.  She has large right ventral hernia, no pain with it. No AB pain.  Possible low grade temp, no fevers, states her sinuses have been bothering her.   She denies NSAID use.  She denies ETOH use.   She denies tobacco use.  She denies drug use.    She  reports that she has never smoked. She has never used smokeless tobacco. She reports current alcohol use. She reports that she does not use drugs.  RELEVANT LABS AND IMAGING: CBC    Component Value Date/Time   WBC 7.7 12/29/2022 1437   RBC 4.60 12/29/2022 1437   HGB 13.2 12/29/2022 1437   HCT 40.6 12/29/2022 1437   PLT 231 12/29/2022 1437   MCV 88.3 12/29/2022 1437   MCH 28.7 12/29/2022 1437   MCHC 32.5 12/29/2022 1437   RDW 13.2 12/29/2022 1437   LYMPHSABS 2.2 11/20/2022 1317   MONOABS 0.5 11/20/2022 1317   EOSABS 0.2 11/20/2022 1317   BASOSABS 0.0 11/20/2022 1317   Recent Labs    06/01/22 0000 06/06/22 1100 09/13/22 0927 11/20/22 1317 12/29/22 1437  HGB 12.6 13.0 13.3 13.3 13.2    CMP     Component Value Date/Time   NA 139 12/29/2022 1437   NA 141 06/01/2022 0000   K 3.9 12/29/2022 1437   CL 108 12/29/2022 1437   CO2 23 12/29/2022 1437   GLUCOSE 106 (H) 12/29/2022 1437   BUN 15 12/29/2022 1437   BUN 17 06/01/2022 0000   CREATININE 1.00 12/29/2022 1437   CREATININE 3.61 (H) 09/16/2015 1515   CALCIUM 9.5 12/29/2022 1437   CALCIUM 8.4 08/30/2012 0819   PROT 6.6 11/20/2022 1317   ALBUMIN 3.4 (L) 11/20/2022 1317   AST 18 11/20/2022 1317   ALT 13 11/20/2022 1317   ALKPHOS 65 11/20/2022 1317   BILITOT 0.6 11/20/2022 1317   GFRNONAA 58 (L) 12/29/2022 1437   GFRAA 42 09/04/2020 0000      Latest Ref Rng & Units 11/20/2022    1:17 PM 09/13/2022    9:27 AM 08/12/2021     1:41 PM  Hepatic Function  Total Protein 6.5 - 8.1 g/dL 6.6  6.6  6.4   Albumin 3.5 - 5.0 g/dL 3.4  4.1  3.7   AST 15 - 41 U/L 18  18  13    ALT 0 - 44 U/L 13  15  10    Alk Phosphatase 38 - 126 U/L 65  69  76   Total Bilirubin 0.3 - 1.2 mg/dL 0.6  0.7  0.4   Bilirubin, Direct 0.0 - 0.2 mg/dL 0.1         Current Medications:   Current Outpatient Medications (Endocrine & Metabolic):    levothyroxine (SYNTHROID) 125 MCG tablet, 1/2 tab po on two days a week, whole tab all other days (Patient taking differently: Take 62.5-125 mcg by mouth See admin instructions. 1/2 tab po on two days a week Wed and Sat, and whole tab all other days)  Current Outpatient Medications (Cardiovascular):    doxazosin (CARDURA) 2 MG tablet, Take 1 tablet (2 mg total) by mouth 2 (two) times daily.   losartan (COZAAR) 100 MG tablet, Take 1  tablet (100 mg total) by mouth daily.   verapamil (CALAN-SR) 120 MG CR tablet, TAKE 1 TABLET BY MOUTH EVERY  AFTERNOON   verapamil (CALAN-SR) 240 MG CR tablet, Take 1 tablet (240 mg total) by mouth every morning.  Current Outpatient Medications (Respiratory):    AYR SALINE NASAL DROPS NA, Place 2 sprays into both nostrils daily.   Camphor-Eucalyptus-Menthol (VICKS VAPORUB EX), Apply 1 application topically daily.  Current Outpatient Medications (Analgesics):    acetaminophen (TYLENOL) 500 MG tablet, Take 1,000 mg by mouth every 8 (eight) hours as needed for moderate pain.  Current Outpatient Medications (Hematological):    apixaban (ELIQUIS) 5 MG TABS tablet, Take 1 tablet (5 mg total) by mouth 2 (two) times daily.   Cyanocobalamin (B-12 PO), Take 1 tablet by mouth daily.  Current Outpatient Medications (Other):    hydrocortisone (ANUSOL-HC) 2.5 % rectal cream, Place 1 Application rectally 2 (two) times daily.   LORazepam (ATIVAN) 0.5 MG tablet, Take 1 tablet (0.5 mg total) by mouth 2 (two) times daily as needed for anxiety.   Magnesium Chloride (MAG64) 64 MG TBEC, TAKE 2  TABLET BY MOUTH EVERY DAY   magnesium chloride (SLOW-MAG) 64 MG TBEC SR tablet, Take 2 tablets (128 mg total) by mouth daily. (Patient taking differently: Take 3 tablets by mouth daily.)   mycophenolate (MYFORTIC) 360 MG TBEC EC tablet, Take 360 mg by mouth 2 (two) times daily.   nystatin powder, Apply 1 Application topically once a week.   sodium bicarbonate 650 MG tablet, Take 1 tablet (650 mg total) by mouth 2 (two) times daily. Takes 1 in AM and 1 at bedtime   tacrolimus (PROGRAF) 1 MG capsule, Take 2 mg by mouth 2 (two) times daily.   triamcinolone cream (KENALOG) 0.1 %, Apply 1 application topically daily.   Vitamin D, Ergocalciferol, (DRISDOL) 1.25 MG (50000 UNIT) CAPS capsule, Take 1 capsule (50,000 Units total) by mouth every 7 (seven) days.  Medical History:  Past Medical History:  Diagnosis Date   Anxiety    Arthritis    Atrial fibrillation (HCC)    Cholelithiasis 04/2021   noted on CT abd/pelv done for R lower abd swelling and MR abd   Chronic renal insufficiency, stage III (moderate) (HCC) 10/2015   Post-transplant baseline sCr 1.2-1.4.     CMV infection [facility]) summer 2017   Valcyte per ID/Renal transplant team   Depression    Diverticulosis    a. 05/2012 colonoscopy   Erosive lichen planus of vulva    Topical steroids (managed by Dr. Robby Sermon [facility] via [facility] out[facility].   ESRD (end stage renal disease) (HCC)    began dialysis 2014--followed by Dr. Lowell Guitar.  Received deceased donor kidney transplant 10/2015.   Fatty liver 10/2021   with hepatomegaly   FSGS (focal segmental glomerulosclerosis)    right kidney; hx of left renal cell cancer and got nephrectomy 1993.   GERD (gastroesophageal reflux disease)    Gout    Heart murmur    (Diastolic) ECHO 08/2013 showed that this murmur is coming from pt's R arm AV fistula   Hiatal hernia    History of renal cell cancer 1993   Left nephrectomy   Hyperlipidemia    Hypertension    Hypothyroidism     Impingement syndrome of left shoulder 04/2016   Dr. Bertram Savin to PT   Kidney cancer, primary, with metastasis from kidney to other site [facility])    Lumbar spinal stenosis    Chronic LBP  with bilat neurogenic claudication   Metatarsal fracture, pathologic 01/2018   Right; orthocarolina-->post op shoe continued, f/u x-ray planned.   Obesity    Osteoarthritis of left knee 08/2017   severe, diffuse, tricompartmental.  Responded to steroid injection.     Osteoporosis 06/07/2016   DEXA T-score of -3.1.  Fosamax planned 2017 but dental work prevented start..  Pathological toe fx 12/2017--repeat DEXA 08/2018, T-score -3.3 radius. 12/2020 T score radius -3.7.   Pneumonia    Renal transplant recipient 11/06/2015   Baseline Cr as of 09/2020 1.2-1.4   Simple hepatic cyst    2022   Solitary kidney, acquired    SVT (supraventricular tachycardia)    UTI (urinary tract infection)    Ventral hernia    Vitamin B12 deficiency 12/2018   Starting replacement as of 12/11/2018   Allergies:  Allergies  Allergen Reactions   Labetalol     Patient couldn't walk, low BP   Ceclor [Cefaclor] Rash   Naproxen Rash   Enalapril Maleate Cough    Vasotec   Metoprolol Tartrate Rash    On legs     Surgical History:  She  has a past surgical history that includes Total abdominal hysterectomy w/ bilateral salpingoophorectomy (1997); Nephrectomy (1993); colonoscopy with polypectomy; AV fistula placement (Right); Renal biopsy; Colonoscopy (2003; 05/2012); TEE without cardioversion (N/A, 08/27/2013); Vulva / perineum biopsy (2015); Kidney transplant (11/06/2015); DEXA (06/07/2016; 08/22/2018; 12/30/20); Ligation of arteriovenous  fistula (Right, 02/14/2017); Resection of arteriovenous fistula aneurysm (Right, 02/14/2017); transthoracic echocardiogram; Cardiovascular stress test; Cataract extraction; and Hysterectomy abdominal with salpingectomy. Family History:  Her family history includes Cancer in her paternal aunt and  sister; Deep vein thrombosis in her mother; Diabetes in her maternal grandfather; Fibroids in her daughter; Heart attack (age of onset: 46) in her father; Hypertension in her father and mother; Stroke in her paternal grandmother.  REVIEW OF SYSTEMS  : All other systems reviewed and negative except where noted in the History of Present Illness.  PHYSICAL EXAM: BP 128/80 (BP Location: Left Arm, Patient Position: Sitting, Cuff Size: Large)   Pulse 96   Ht 5\' 5"  (1.651 m)   Wt 232 lb 6 oz (105.4 kg)   BMI 38.67 kg/m  General Appearance: Well nourished, in no apparent distress. Head:   Normocephalic and atraumatic. Eyes:  sclerae anicteric,conjunctive pink  Respiratory: Respiratory effort normal, BS equal bilaterally without rales, rhonchi, wheezing. Cardio: Irreg irreg with no MRGs. Peripheral pulses intact.  Abdomen: Soft,  Obese large right sided ventral hernia,active bowel sounds. No tenderness . Without guarding and Without rebound. No masses. Rectal: Normal external rectal exam, normal to decrased rectal tone, no internal hemorrhoids appreciated, no masses, non tender, soft brown stool, hemoccult Positive Musculoskeletal: Full ROM, Antalgic gait. Without edema. Skin:  Dry and intact without significant lesions or rashes Neuro: Alert and  oriented x4;  No focal deficits. Psych:  Cooperative. Normal mood and affect.    Doree Albee, PA-C 9:39 AM

## 2023-01-23 ENCOUNTER — Telehealth: Payer: Self-pay | Admitting: *Deleted

## 2023-01-23 ENCOUNTER — Encounter: Payer: Self-pay | Admitting: Physician Assistant

## 2023-01-23 ENCOUNTER — Ambulatory Visit (INDEPENDENT_AMBULATORY_CARE_PROVIDER_SITE_OTHER): Payer: Medicare Other | Admitting: Physician Assistant

## 2023-01-23 VITALS — BP 128/80 | HR 96 | Ht 65.0 in | Wt 232.4 lb

## 2023-01-23 DIAGNOSIS — K439 Ventral hernia without obstruction or gangrene: Secondary | ICD-10-CM | POA: Diagnosis not present

## 2023-01-23 DIAGNOSIS — I480 Paroxysmal atrial fibrillation: Secondary | ICD-10-CM | POA: Diagnosis not present

## 2023-01-23 DIAGNOSIS — K625 Hemorrhage of anus and rectum: Secondary | ICD-10-CM | POA: Diagnosis not present

## 2023-01-23 MED ORDER — HYDROCORTISONE (PERIANAL) 2.5 % EX CREA: 1.0000 | TOPICAL_CREAM | Freq: Two times a day (BID) | CUTANEOUS | 2 refills | Status: DC

## 2023-01-23 MED ORDER — NA SULFATE-K SULFATE-MG SULF 17.5-3.13-1.6 GM/177ML PO SOLN: 1.0000 | Freq: Once | ORAL | 0 refills | Status: AC

## 2023-01-23 NOTE — Telephone Encounter (Signed)
[facility] Pre-operative Risk Assessment     Request for surgical clearance:     Endoscopy Procedure  What type of surgery is being performed?     colonoscopy  When is this surgery scheduled?     03/10/2023  What type of clearance is required ?   Pharmacy  Are there any medications that need to be held prior to surgery and how long? Eliquis  Practice name and name of physician performing surgery?      [facility]  What is your office phone and fax number?      Phone- [phone]  Fax- [phone]  Anesthesia type (None, local, MAC, general) ?       MAC

## 2023-01-23 NOTE — Patient Instructions (Addendum)
  Apply a pea size amount of the Anusol HC cream that I sent in to the tip of an over the counter PrepH suppository and insert rectally once every night for at least 7 nights.  Toileting tips to help with your constipation - Drink at least 64-80 ounces of water/liquid per day. - Establish a time to try to move your bowels every day.  For many people, this is after a cup of coffee or after a meal such as breakfast. - Sit all of the way back on the toilet keeping your back fairly straight and while sitting up, try to rest the tops of your forearms on your upper thighs.   - Raising your feet with a step stool/squatty potty can be helpful to improve the angle that allows your stool to pass through the rectum. - Relax the rectum feeling it bulge toward the toilet water.  If you feel your rectum raising toward your body, you are contracting rather than relaxing. - Breathe in and slowly exhale. "Belly breath" by expanding your belly towards your belly button. Keep belly expanded as you gently direct pressure down and back to the anus.  A low pitched GRRR sound can assist with increasing intra-abdominal pressure.  (Can also trying to blow on a pinwheel and make it move, this helps with the same belly breathing) - Repeat 3-4 times. If unsuccessful, contract the pelvic floor to restore normal tone and get off the toilet.  Avoid excessive straining. - To reduce excessive wiping by teaching your anus to normally contract, place hands on outer aspect of knees and resist knee movement outward.  Hold 5-10 second then place hands just inside of knees and resist inward movement of knees.  Hold 5 seconds.  Repeat a few times each way.  Go to the ER if unable to pass gas, severe AB pain, unable to hold down food, any shortness of breath of chest pain.    You have been scheduled for a colonoscopy. Please follow written instructions given to you at your visit today.  Please pick up your prep supplies at the pharmacy  within the next 1-3 days. If you use inhalers (even only as needed), please bring them with you on the day of your procedure.    Due to recent changes in healthcare laws, you may see the results of your imaging and laboratory studies on MyChart before your provider has had a chance to review them.  We understand that in some cases there may be results that are confusing or concerning to you. Not all laboratory results come back in the same time frame and the provider may be waiting for multiple results in order to interpret others.  Please give [place] 48 hours in order for your provider to thoroughly review all the results before contacting the office for clarification of your results.   I appreciate the  opportunity to care for you  Thank You   [facility]

## 2023-01-23 NOTE — Telephone Encounter (Signed)
Patient with diagnosis of afib on Eliquis for anticoagulation.    Procedure: colonoscopy  Date of procedure: 03/10/2023   CHA2DS2-VASc Score = 5   This indicates a 7.2% annual risk of stroke. The patient's score is based upon: CHF History: 0 HTN History: 1 Diabetes History: 0 Stroke History: 0 Vascular Disease History: 1 Age Score: 2 Gender Score: 1     CrCl 57 mL/min (SrCr 1.00 12/29/2022) Platelet count 231 K (12/29/2022)    Per office protocol, patient can hold Eliquis for 1-2 days prior to procedure.     **This guidance is not considered finalized until pre-operative APP has relayed final recommendations.**

## 2023-01-23 NOTE — Telephone Encounter (Signed)
Called patient and informed her ok to hold Eliquis 2 days prior to her procedure

## 2023-01-23 NOTE — Progress Notes (Signed)
Noted  

## 2023-01-23 NOTE — Telephone Encounter (Signed)
   Patient Name: Tabitha Green  DOB: [birth year] MRN: [id number]  Primary Cardiologist: None  Clinical pharmacists have reviewed the patient's past medical history, labs, and current medications as part of preoperative protocol coverage. The following recommendations have been made:  Patient with diagnosis of afib on Eliquis for anticoagulation.     Procedure: colonoscopy  Date of procedure: 03/10/2023     CHA2DS2-VASc Score = 5  This indicates a 7.2% annual risk of stroke. The patient's score is based upon: CHF History: 0 HTN History: 1 Diabetes History: 0 Stroke History: 0 Vascular Disease History: 1 Age Score: 2 Gender Score: 1       CrCl 57 mL/min (SrCr 1.00 12/29/2022) Platelet count 231 K (12/29/2022)     Per office protocol, patient can hold Eliquis for 1-2 days prior to procedure.  Please resume Eliquis as soon as possible postprocedure, at the discretion of the surgeon.   I will route this recommendation to the requesting party via Epic fax function and remove from pre-op pool.  Please call with questions.  Joylene Grapes, NP 01/23/2023, 11:45 AM

## 2023-02-03 NOTE — Progress Notes (Signed)
CARDIOLOGY CONSULT NOTE       Patient ID: Tabitha Green MRN: [id number] DOB/AGE: 1946-02-26 77 y.o.  Referring Physician: McGowen Primary Physician: Jeoffrey Massed, MD Primary Cardiologist: New Reason for Consultation: Arrhythmia/Palpitations    HPI:  77 y.o. history of renal transplant, HTN, HLD, hypothyroid and PAF. Seen by GT in 2015 for SVT Diagnosed with PAF 11/14/12 at [facility] Rx with eliquis for CHADVASC 5 and verapamil. She really cannot tell when she is out of rhythm. Seen in afib clinic 4/25 was in afib but 01/16/23 was in NSR  Monitor 01/08/23 showed NSR low P wave amplitude but no obvious PAF. Read by Dr Ladona Ridgel TTE 12/28/22 EF 60-65% moderate LAE No significant valve dx  AAT complicated by taking Prograf. Would only really be a candidate for Tikosyn or ablation. Given low burden PAF Afib clinic suggested no change and has not formally seen EP. Baseline Cr is normal 1.0 with K 3.9 on 12/29/22 Hct 40.6 and PLT 231 No bleeding issues   She is not rushing into AAT or ablation She is in NSR today She should be fine to hold eliquis for 2 days for colonoscopy with Dr Marina Goodell   ROS All other systems reviewed and negative except as noted above  Past Medical History:  Diagnosis Date   Anxiety    Arthritis    Atrial fibrillation (HCC)    Cholelithiasis 04/2021   noted on CT abd/pelv done for R lower abd swelling and MR abd   Chronic renal insufficiency, stage III (moderate) (HCC) 10/2015   Post-transplant baseline sCr 1.2-1.4.     CMV infection [facility]) summer 2017   Valcyte per ID/Renal transplant team   Depression    Diverticulosis    a. 05/2012 colonoscopy   Erosive lichen planus of vulva    Topical steroids (managed by Dr. Robby Sermon [facility] via [facility] outpt services.   ESRD (end stage renal disease) (HCC)    began dialysis 2014--followed by Dr. Lowell Guitar.  Received deceased donor kidney transplant 10/2015.   Fatty liver 10/2021   with hepatomegaly   FSGS  (focal segmental glomerulosclerosis)    right kidney; hx of left renal cell cancer and got nephrectomy 1993.   GERD (gastroesophageal reflux disease)    Gout    Heart murmur    (Diastolic) ECHO 08/2013 showed that this murmur is coming from pt's R arm AV fistula   Hiatal hernia    History of renal cell cancer 1993   Left nephrectomy   Hyperlipidemia    Hypertension    Hypothyroidism    Impingement syndrome of left shoulder 04/2016   Dr. Bertram Savin to PT   Kidney cancer, primary, with metastasis from kidney to other site [facility])    Lumbar spinal stenosis    Chronic LBP with bilat neurogenic claudication   Metatarsal fracture, pathologic 01/2018   Right; orthocarolina-->post op shoe continued, f/u x-ray planned.   Obesity    Osteoarthritis of left knee 08/2017   severe, diffuse, tricompartmental.  Responded to steroid injection.     Osteoporosis 06/07/2016   DEXA T-score of -3.1.  Fosamax planned 2017 but dental work prevented start..  Pathological toe fx 12/2017--repeat DEXA 08/2018, T-score -3.3 radius. 12/2020 T score radius -3.7.   Pneumonia    Renal transplant recipient 11/06/2015   Baseline Cr as of 09/2020 1.2-1.4   Simple hepatic cyst    2022   Solitary kidney, acquired    SVT (supraventricular tachycardia)    UTI (urinary  tract infection)    Ventral hernia    Vitamin B12 deficiency 12/2018   Starting replacement as of 12/11/2018    Family History  Problem Relation Age of Onset   Deep vein thrombosis Mother        post thyroid surgery   Hypertension Mother    Heart attack Father 48       deceased   Hypertension Father    Cancer Sister        breast   Cancer Paternal Aunt        pancreatic   Diabetes Maternal Grandfather    Stroke Paternal Grandmother        in 24s   Fibroids Daughter    Colon cancer Neg Hx    Esophageal cancer Neg Hx    Stomach cancer Neg Hx    Rectal cancer Neg Hx    Sleep apnea Neg Hx     Social History   Socioeconomic History    Marital status: Married    Spouse name: Not on file   Number of children: 2   Years of education: Not on file   Highest education level: 12th grade  Occupational History   Occupation: Retired  Tobacco Use   Smoking status: Never   Smokeless tobacco: Never  Vaping Use   Vaping status: Never Used  Substance and Sexual Activity   Alcohol use: Yes    Comment: rarely   Drug use: No   Sexual activity: Not on file  Other Topics Concern   Not on file  Social History Narrative   Lives in [place] with husband.  Retired.  Previously worked in [employer] @ [employer].   No T/A/Ds.   Social Determinants of Health   Financial Resource Strain: Low Risk  (09/14/2022)   Overall Financial Resource Strain (CARDIA)    Difficulty of Paying Living Expenses: Not hard at all  Food Insecurity: No Food Insecurity (09/14/2022)   Hunger Vital Sign    Worried About Running Out of Food in the Last Year: Never true    Ran Out of Food in the Last Year: Never true  Transportation Needs: No Transportation Needs (09/14/2022)   PRAPARE - Administrator, Civil Service (Medical): No    Lack of Transportation (Non-Medical): No  Physical Activity: Insufficiently Active (09/14/2022)   Exercise Vital Sign    Days of Exercise per Week: 2 days    Minutes of Exercise per Session: 20 min  Stress: Stress Concern Present (09/14/2022)   [employer] of Occupational Health - Occupational Stress Questionnaire    Feeling of Stress : To some extent  Social Connections: Moderately Integrated (09/14/2022)   Social Connection and Isolation Panel [NHANES]    Frequency of Communication with Friends and Family: More than three times a week    Frequency of Social Gatherings with Friends and Family: More than three times a week    Attends Religious Services: 1 to 4 times per year    Active Member of [employer] or Organizations: No    Attends Banker Meetings: Never    Marital Status: Married  Catering manager  Violence: Not At Risk (09/14/2022)   Humiliation, Afraid, Rape, and Kick questionnaire    Fear of Current or Ex-Partner: No    Emotionally Abused: No    Physically Abused: No    Sexually Abused: No    Past Surgical History:  Procedure Laterality Date   AV FISTULA PLACEMENT Right    Right arm: aneurismal  dilatation 2017 being followed by CV surgeons   CARDIOVASCULAR STRESS TEST     03/10/15 ETT ([facility]): Exercise ECG negative at 83% max predicted HR.    CATARACT EXTRACTION     COLONOSCOPY  2003; 05/2012   diverticulosis, no polyps.  Recall 10 yrs   colonoscopy with polypectomy     Dr Marina Goodell   DEXA  06/07/2016; 08/22/2018; 12/30/20   2017 T-score -3.1.  2020 T score -3.3.  2022 T score -3.7   HYSTERECTOMY ABDOMINAL WITH SALPINGECTOMY     KIDNEY TRANSPLANT  10-Nov-2015   Deceased donor kidney transplant, with Thymoglobulin induction   LIGATION OF ARTERIOVENOUS  FISTULA Right 02/14/2017   Procedure: LIGATION OF ARTERIOVENOUS  FISTULA;  Surgeon: Chuck Hint, MD;  Location: [facility] OR;  Service: Vascular;  Laterality: Right;   NEPHRECTOMY  1993   for malignancy- left   RENAL BIOPSY     right   RESECTION OF ARTERIOVENOUS FISTULA ANEURYSM Right 02/14/2017   Procedure: EXCISION OF RIGHT BRACHIOCEPHALIC ARTERIOVENOUS FISTULA ANEURYSM;  Surgeon: Chuck Hint, MD;  Location: [facility] OR;  Service: Vascular;  Laterality: Right;   TEE WITHOUT CARDIOVERSION N/A 08/27/2013   Procedure: TRANSESOPHAGEAL ECHOCARDIOGRAM (TEE);  Surgeon: Wendall Stade, MD;  Location: [facility] ENDOSCOPY;  Service: Cardiovascular;  Laterality: N/A;   TOTAL ABDOMINAL HYSTERECTOMY W/ BILATERAL SALPINGOOPHORECTOMY  1997   fibroids   TRANSTHORACIC ECHOCARDIOGRAM     03/10/15 echo ([facility]): LV cavity normal in size, focal basal hypertrophy, normal systolic function, EF 55% (visual est). Normal wall motion, no regional wall motion abnormalities. Mild diastolic dysfunction with normal LA chamber size.  No significant valve stenosis or regurgitation.  12/2022 normal   VULVA / PERINEUM BIOPSY  2015      Current Outpatient Medications:    acetaminophen (TYLENOL) 500 MG tablet, Take 1,000 mg by mouth every 8 (eight) hours as needed for moderate pain., Disp: , Rfl:    apixaban (ELIQUIS) 5 MG TABS tablet, Take 1 tablet (5 mg total) by mouth 2 (two) times daily., Disp: 180 tablet, Rfl: 1   AYR SALINE NASAL DROPS NA, Place 2 sprays into both nostrils daily., Disp: , Rfl:    Camphor-Eucalyptus-Menthol (VICKS VAPORUB EX), Apply 1 application topically daily., Disp: , Rfl:    Cyanocobalamin (B-12 PO), Take 1 tablet by mouth daily., Disp: , Rfl:    doxazosin (CARDURA) 2 MG tablet, Take 1 tablet (2 mg total) by mouth 2 (two) times daily., Disp: 180 tablet, Rfl: 0   hydrocortisone (ANUSOL-HC) 2.5 % rectal cream, Place 1 Application rectally 2 (two) times daily., Disp: 30 g, Rfl: 2   levothyroxine (SYNTHROID) 125 MCG tablet, 1/2 tab po on two days a week, whole tab all other days (Patient taking differently: Take 62.5-125 mcg by mouth See admin instructions. 1/2 tab po on two days a week Wed and Sat, and whole tab all other days), Disp: 90 tablet, Rfl: 3   LORazepam (ATIVAN) 0.5 MG tablet, Take 1 tablet (0.5 mg total) by mouth 2 (two) times daily as needed for anxiety., Disp: 30 tablet, Rfl: 1   losartan (COZAAR) 100 MG tablet, Take 1 tablet (100 mg total) by mouth daily., Disp: 90 tablet, Rfl: 3   Magnesium Chloride (MAG64) 64 MG TBEC, TAKE 2 TABLET BY MOUTH EVERY DAY, Disp: 60 tablet, Rfl: 0   magnesium chloride (SLOW-MAG) 64 MG TBEC SR tablet, Take 2 tablets (128 mg total) by mouth daily. (Patient taking differently: Take 3 tablets by mouth  daily.), Disp: 60 tablet, Rfl: 1   mycophenolate (MYFORTIC) 360 MG TBEC EC tablet, Take 360 mg by mouth 2 (two) times daily., Disp: , Rfl:    Na Sulfate-K Sulfate-Mg Sulf 17.5-3.13-1.6 GM/177ML SOLN, Take by mouth once., Disp: , Rfl:    nystatin powder, Apply 1  Application topically once a week., Disp: , Rfl:    silver sulfADIAZINE (SILVADENE) 1 % cream, Apply 1 Application topically daily., Disp: , Rfl:    sodium bicarbonate 650 MG tablet, Take 1 tablet (650 mg total) by mouth 2 (two) times daily. Takes 1 in AM and 1 at bedtime, Disp: 180 tablet, Rfl: 3   tacrolimus (PROGRAF) 1 MG capsule, Take 2 mg by mouth 2 (two) times daily., Disp: , Rfl:    triamcinolone cream (KENALOG) 0.1 %, Apply 1 application topically daily., Disp: , Rfl:    verapamil (CALAN-SR) 120 MG CR tablet, TAKE 1 TABLET BY MOUTH EVERY  AFTERNOON, Disp: 90 tablet, Rfl: 2   verapamil (CALAN-SR) 240 MG CR tablet, Take 1 tablet (240 mg total) by mouth every morning., Disp: 90 tablet, Rfl: 3   Vitamin D, Ergocalciferol, (DRISDOL) 1.25 MG (50000 UNIT) CAPS capsule, Take 1 capsule (50,000 Units total) by mouth every 7 (seven) days., Disp: 12 capsule, Rfl: 1    Physical Exam: Blood pressure 122/80, pulse 76, height 5\' 5"  (1.651 m), weight 231 lb 6.4 oz (105 kg), SpO2 98%.   Affect appropriate Healthy:  appears stated age HEENT: normal Neck supple with no adenopathy JVP normal no bruits no thyromegaly Lungs clear with no wheezing and good diaphragmatic motion Heart:  S1/S2 no murmur, no rub, gallop or click PMI normal Abdomen: benighn, post renal transplant  no bruit.  No HSM or HJR Distal pulses intact with no bruits No edema Neuro non-focal Skin warm and dry No muscular weakness   Labs:   Lab Results  Component Value Date   WBC 7.7 12/29/2022   HGB 13.2 12/29/2022   HCT 40.6 12/29/2022   MCV 88.3 12/29/2022   PLT 231 12/29/2022   No results for input(s): "NA", "K", "CL", "CO2", "BUN", "CREATININE", "CALCIUM", "PROT", "BILITOT", "ALKPHOS", "ALT", "AST", "GLUCOSE" in the last 168 hours.  Invalid input(s): "LABALBU" Lab Results  Component Value Date   TROPONINI <0.30 12/08/2012    Lab Results  Component Value Date   CHOL 148 09/13/2022   CHOL 144 11/16/2021   CHOL  152 11/18/2019   Lab Results  Component Value Date   HDL 41.30 09/13/2022   HDL 40.30 11/16/2021   HDL 41 11/18/2019   Lab Results  Component Value Date   LDLCALC 79 09/13/2022   LDLCALC 79 11/16/2021   LDLCALC 90 11/18/2019   Lab Results  Component Value Date   TRIG 140.0 09/13/2022   TRIG 126.0 11/16/2021   TRIG 117 11/18/2019   Lab Results  Component Value Date   CHOLHDL 4 09/13/2022   CHOLHDL 4 11/16/2021   CHOLHDL 4 02/18/2019   Lab Results  Component Value Date   LDLDIRECT 166.3 10/02/2007      Radiology: No results found.  EKG: 01/16/23 SR rate 67 QT 410 msec and PR 240 msec   ASSESSMENT AND PLAN:   PAF:  low burden on eliquis for anticoagulation and verapamil as AV nodal drug EF normal no valve dx Moderate LAE. AAT complicated by use of Prograf  Refer to GT with EP Renal Transplant:  Cr normal 1.0 at baseline on Myfortic and Prograf  Thyroid:  continue synthroid  replacement TSH normal HTN:  normal range on ARB and verapamil  F/U GT next available F/U me in 6 months   Suggested coronary calcium score to risk stratify for CAD  Signed: Charlton Haws 02/16/2023, 10:16 AM

## 2023-02-16 ENCOUNTER — Encounter: Payer: Self-pay | Admitting: Cardiovascular Disease

## 2023-02-16 ENCOUNTER — Other Ambulatory Visit: Payer: Self-pay | Admitting: Family Medicine

## 2023-02-16 ENCOUNTER — Ambulatory Visit: Payer: Medicare Other | Attending: Cardiovascular Disease | Admitting: Cardiovascular Disease

## 2023-02-16 VITALS — BP 122/80 | HR 76 | Ht 65.0 in | Wt 231.4 lb

## 2023-02-16 DIAGNOSIS — I480 Paroxysmal atrial fibrillation: Secondary | ICD-10-CM

## 2023-02-16 DIAGNOSIS — D6869 Other thrombophilia: Secondary | ICD-10-CM | POA: Diagnosis not present

## 2023-02-16 NOTE — Patient Instructions (Signed)
Medication Instructions:  Your physician recommends that you continue on your current medications as directed. Please refer to the Current Medication list given to you today.  *If you need a refill on your cardiac medications before your next appointment, please call your pharmacy*   Lab Work: If you have labs (blood work) drawn today and your tests are completely normal, you will receive your results only by: MyChart Message (if you have MyChart) OR A paper copy in the mail If you have any lab test that is abnormal or we need to change your treatment, we will call you to review the results.  Follow-Up: At [facility], you and your health needs are our priority.  As part of our continuing mission to provide you with exceptional heart care, we have created designated Provider Care Teams.  These Care Teams include your primary Cardiologist (physician) and Advanced Practice Providers (APPs -  Physician Assistants and Nurse Practitioners) who all work together to provide you with the care you need, when you need it.  We recommend signing up for the patient portal called "MyChart".  Sign up information is provided on this After Visit Summary.  MyChart is used to connect with patients for Virtual Visits (Telemedicine).  Patients are able to view lab/test results, encounter notes, upcoming appointments, etc.  Non-urgent messages can be sent to your provider as well.   To learn more about what you can do with MyChart, go to [email or web address].    Your next appointment:   6 months  Provider:   Charlton Haws, MD     Other Instructions You have been referred to Electrophysiology (EP) Doctor as soon as possible.

## 2023-02-16 NOTE — Telephone Encounter (Signed)
Pt has app. 7/12

## 2023-02-16 NOTE — Patient Instructions (Signed)

## 2023-02-17 ENCOUNTER — Ambulatory Visit (INDEPENDENT_AMBULATORY_CARE_PROVIDER_SITE_OTHER): Payer: Medicare Other | Admitting: Family Medicine

## 2023-02-17 ENCOUNTER — Encounter: Payer: Self-pay | Admitting: Family Medicine

## 2023-02-17 VITALS — BP 130/80 | HR 63 | Wt 235.4 lb

## 2023-02-17 DIAGNOSIS — N1831 Chronic kidney disease, stage 3a: Secondary | ICD-10-CM

## 2023-02-17 DIAGNOSIS — F411 Generalized anxiety disorder: Secondary | ICD-10-CM

## 2023-02-17 DIAGNOSIS — I10 Essential (primary) hypertension: Secondary | ICD-10-CM

## 2023-02-17 DIAGNOSIS — I480 Paroxysmal atrial fibrillation: Secondary | ICD-10-CM

## 2023-02-17 DIAGNOSIS — E039 Hypothyroidism, unspecified: Secondary | ICD-10-CM | POA: Diagnosis not present

## 2023-02-17 DIAGNOSIS — Z79899 Other long term (current) drug therapy: Secondary | ICD-10-CM | POA: Diagnosis not present

## 2023-02-17 NOTE — Progress Notes (Signed)
OFFICE VISIT  02/17/2023  CC:  Chief Complaint  Patient presents with   Medical Management of Chronic Issues    Patient is a 77 y.o. female who presents for follow-up hypertension, chronic renal insufficiency, anxiety, and atrial fibrillation.  INTERIM HX: Tabitha Green feels fine.  She is anxious but really no more than usual. She does not take her lorazepam very often at all.  She saw her primary cardiologist, Dr. Eden Emms, yesterday. No changes were made--> continued on verapamil and Eliquis.  Also was referred to electrophysiologist. She has no palpitations, shortness of breath, chest pain, dizziness, or lower extremity swelling.  PMP AWARE reviewed today: most recent rx for lorazepam 0.5 mg was filled 12/23/2022, # 30, rx by me. No red flags.   ROS as above, plus--> no fevers, no CP, no SOB, no wheezing, no cough, no HAs, no rashes, no melena/hematochezia.  No polyuria or polydipsia.  No myalgias or arthralgias.  No focal weakness, paresthesias, or tremors.  No acute vision or hearing abnormalities.  No dysuria or unusual/new urinary urgency or frequency.  No recent changes in lower legs. No n/v/d or abd pain.  No palpitations.    Past Medical History:  Diagnosis Date   Anxiety    Arthritis    Atrial fibrillation (HCC)    Cholelithiasis 04/2021   noted on CT abd/pelv done for R lower abd swelling and MR abd   Chronic renal insufficiency, stage III (moderate) (HCC) 10/2015   Post-transplant baseline sCr 1.2-1.4.     CMV infection [facility]) summer 2017   Valcyte per ID/Renal transplant team   Depression    Diverticulosis    a. 05/2012 colonoscopy   Erosive lichen planus of vulva    Topical steroids (managed by Dr. Robby Sermon [facility] via [facility] outpt services.   ESRD (end stage renal disease) (HCC)    began dialysis 2014--followed by Dr. Lowell Guitar.  Received deceased donor kidney transplant 10/2015.   Fatty liver 10/2021   with hepatomegaly   FSGS (focal segmental  glomerulosclerosis)    right kidney; hx of left renal cell cancer and got nephrectomy 1993.   GERD (gastroesophageal reflux disease)    Gout    Heart murmur    (Diastolic) ECHO 08/2013 showed that this murmur is coming from pt's R arm AV fistula   Hiatal hernia    History of renal cell cancer 1993   Left nephrectomy   Hyperlipidemia    Hypertension    Hypothyroidism    Impingement syndrome of left shoulder 04/2016   Dr. Bertram Savin to PT   Kidney cancer, primary, with metastasis from kidney to other site [facility])    Lumbar spinal stenosis    Chronic LBP with bilat neurogenic claudication   Metatarsal fracture, pathologic 01/2018   Right; orthocarolina-->post op shoe continued, f/u x-ray planned.   Obesity    Osteoarthritis of left knee 08/2017   severe, diffuse, tricompartmental.  Responded to steroid injection.     Osteoporosis 06/07/2016   DEXA T-score of -3.1.  Fosamax planned 2017 but dental work prevented start..  Pathological toe fx 12/2017--repeat DEXA 08/2018, T-score -3.3 radius. 12/2020 T score radius -3.7.   Pneumonia    Renal transplant recipient 11/06/2015   Baseline Cr as of 09/2020 1.2-1.4   Simple hepatic cyst    2022   Solitary kidney, acquired    SVT (supraventricular tachycardia)    UTI (urinary tract infection)    Ventral hernia    Vitamin B12 deficiency 12/2018  Starting replacement as of 12/11/2018    Past Surgical History:  Procedure Laterality Date   AV FISTULA PLACEMENT Right    Right arm: aneurismal dilatation 2017 being followed by CV surgeons   CARDIOVASCULAR STRESS TEST     03/10/15 ETT ([facility]): Exercise ECG negative at 83% max predicted HR.    CATARACT EXTRACTION     COLONOSCOPY  2003; 05/2012   diverticulosis, no polyps.  Recall 10 yrs   colonoscopy with polypectomy     Dr Marina Goodell   DEXA  06/07/2016; 08/22/2018; 12/30/20   2017 T-score -3.1.  2020 T score -3.3.  2022 T score -3.7   HYSTERECTOMY ABDOMINAL WITH SALPINGECTOMY     KIDNEY  TRANSPLANT  December 02, 2015   Deceased donor kidney transplant, with Thymoglobulin induction   LIGATION OF ARTERIOVENOUS  FISTULA Right 02/14/2017   Procedure: LIGATION OF ARTERIOVENOUS  FISTULA;  Surgeon: Chuck Hint, MD;  Location: [facility] OR;  Service: Vascular;  Laterality: Right;   NEPHRECTOMY  1993   for malignancy- left   RENAL BIOPSY     right   RESECTION OF ARTERIOVENOUS FISTULA ANEURYSM Right 02/14/2017   Procedure: EXCISION OF RIGHT BRACHIOCEPHALIC ARTERIOVENOUS FISTULA ANEURYSM;  Surgeon: Chuck Hint, MD;  Location: [facility] OR;  Service: Vascular;  Laterality: Right;   TEE WITHOUT CARDIOVERSION N/A 08/27/2013   Procedure: TRANSESOPHAGEAL ECHOCARDIOGRAM (TEE);  Surgeon: Wendall Stade, MD;  Location: [facility] ENDOSCOPY;  Service: Cardiovascular;  Laterality: N/A;   TOTAL ABDOMINAL HYSTERECTOMY W/ BILATERAL SALPINGOOPHORECTOMY  1997   fibroids   TRANSTHORACIC ECHOCARDIOGRAM     03/10/15 echo ([facility]): LV cavity normal in size, focal basal hypertrophy, normal systolic function, EF 55% (visual est). Normal wall motion, no regional wall motion abnormalities. Mild diastolic dysfunction with normal LA chamber size. No significant valve stenosis or regurgitation.  12/2022 normal   VULVA / PERINEUM BIOPSY  2015    Outpatient Medications Prior to Visit  Medication Sig Dispense Refill   acetaminophen (TYLENOL) 500 MG tablet Take 1,000 mg by mouth every 8 (eight) hours as needed for moderate pain.     apixaban (ELIQUIS) 5 MG TABS tablet Take 1 tablet (5 mg total) by mouth 2 (two) times daily. 180 tablet 1   AYR SALINE NASAL DROPS NA Place 2 sprays into both nostrils daily.     Camphor-Eucalyptus-Menthol (VICKS VAPORUB EX) Apply 1 application topically daily.     Cyanocobalamin (B-12 PO) Take 1 tablet by mouth daily.     doxazosin (CARDURA) 2 MG tablet TAKE 1 TABLET(2 MG) BY MOUTH TWICE DAILY 180 tablet 3   hydrocortisone (ANUSOL-HC) 2.5 % rectal cream Place 1  Application rectally 2 (two) times daily. 30 g 2   levothyroxine (SYNTHROID) 125 MCG tablet 1/2 tab po on two days a week, whole tab all other days (Patient taking differently: Take 62.5-125 mcg by mouth See admin instructions. 1/2 tab po on two days a week Wed and Sat, and whole tab all other days) 90 tablet 3   LORazepam (ATIVAN) 0.5 MG tablet Take 1 tablet (0.5 mg total) by mouth 2 (two) times daily as needed for anxiety. 30 tablet 1   losartan (COZAAR) 100 MG tablet Take 1 tablet (100 mg total) by mouth daily. 90 tablet 3   Magnesium Chloride (MAG64) 64 MG TBEC TAKE 2 TABLET BY MOUTH EVERY DAY 60 tablet 0   magnesium chloride (SLOW-MAG) 64 MG TBEC SR tablet Take 2 tablets (128 mg total) by mouth daily. (Patient taking  differently: Take 3 tablets by mouth daily.) 60 tablet 1   mycophenolate (MYFORTIC) 360 MG TBEC EC tablet Take 360 mg by mouth 2 (two) times daily.     Na Sulfate-K Sulfate-Mg Sulf 17.5-3.13-1.6 GM/177ML SOLN Take by mouth once.     nystatin powder Apply 1 Application topically once a week.     silver sulfADIAZINE (SILVADENE) 1 % cream Apply 1 Application topically daily.     sodium bicarbonate 650 MG tablet Take 1 tablet (650 mg total) by mouth 2 (two) times daily. Takes 1 in AM and 1 at bedtime 180 tablet 3   tacrolimus (PROGRAF) 1 MG capsule Take 2 mg by mouth 2 (two) times daily.     triamcinolone cream (KENALOG) 0.1 % Apply 1 application topically daily.     verapamil (CALAN-SR) 120 MG CR tablet TAKE 1 TABLET BY MOUTH EVERY  AFTERNOON 90 tablet 2   verapamil (CALAN-SR) 240 MG CR tablet Take 1 tablet (240 mg total) by mouth every morning. 90 tablet 3   Vitamin D, Ergocalciferol, (DRISDOL) 1.25 MG (50000 UNIT) CAPS capsule Take 1 capsule (50,000 Units total) by mouth every 7 (seven) days. 12 capsule 1   No facility-administered medications prior to visit.    Allergies  Allergen Reactions   Labetalol     Patient couldn't walk, low BP   Ceclor [Cefaclor] Rash   Naproxen  Rash   Enalapril Maleate Cough    Vasotec   Metoprolol Tartrate Rash    On legs    Review of Systems As per HPI  PE:    02/17/2023    1:43 PM 02/16/2023   10:00 AM 01/23/2023    8:52 AM  Vitals with BMI  Height  5\' 5"  5\' 5"   Weight 235 lbs 6 oz 231 lbs 6 oz 232 lbs 6 oz  BMI 39.17 38.51 38.67  Systolic 130 122 962  Diastolic 80 80 80  Pulse 63 76 96     Physical Exam  Gen: Alert, well appearing.  Patient is oriented to person, place, time, and situation. AFFECT: pleasant, lucid thought and speech. CV: RRR with some ectopy, no m/r/g.   LUNGS: CTA bilat, nonlabored resps, good aeration in all lung fields. EXT: no clubbing or cyanosis.  no edema.    LABS:  Last CBC Lab Results  Component Value Date   WBC 7.7 12/29/2022   HGB 13.2 12/29/2022   HCT 40.6 12/29/2022   MCV 88.3 12/29/2022   MCH 28.7 12/29/2022   RDW 13.2 12/29/2022   PLT 231 12/29/2022   Last metabolic panel Lab Results  Component Value Date   GLUCOSE 106 (H) 12/29/2022   NA 139 12/29/2022   K 3.9 12/29/2022   CL 108 12/29/2022   CO2 23 12/29/2022   BUN 15 12/29/2022   CREATININE 1.00 12/29/2022   GFRNONAA 58 (L) 12/29/2022   CALCIUM 9.5 12/29/2022   PHOS 2.9 01/04/2018   PROT 6.6 11/20/2022   ALBUMIN 3.4 (L) 11/20/2022   BILITOT 0.6 11/20/2022   ALKPHOS 65 11/20/2022   AST 18 11/20/2022   ALT 13 11/20/2022   ANIONGAP 8 12/29/2022   Last lipids Lab Results  Component Value Date   CHOL 148 09/13/2022   HDL 41.30 09/13/2022   LDLCALC 79 09/13/2022   LDLDIRECT 166.3 10/02/2007   TRIG 140.0 09/13/2022   CHOLHDL 4 09/13/2022   Last hemoglobin A1c Lab Results  Component Value Date   HGBA1C 5.4 02/23/2012   Last thyroid functions Lab Results  Component Value Date   TSH 4.36 12/19/2022   Last vitamin D Lab Results  Component Value Date   VD25OH 14.49 (L) 09/13/2022   Last vitamin B12 and Folate Lab Results  Component Value Date   VITAMINB12 417 11/17/2022   IMPRESSION AND  PLAN:  #1 generalized anxiety disorder. Stable.  Reassured.  Encouraged her to use her lorazepam as needed.  #2 hypertension, well-controlled on losartan 100 mg a day.  #3 chronic renal insufficiency stage III, history of renal transplant. Most recent serum creatinine was 1.0 on 12/29/22, GFR 58. She has nephrology follow-up in a couple weeks (Dr. Glenna Fellows).  She has previsit labs next week.  #4 hypothyroidism. TSH was over suppressed earlier this year but was back into normal range 2 months ago.  Cont one half of 125 mcg tab on 2 days a week and a whole tab on all other days. Recheck TSH next o/v in 3 mo.  #5 PAF, asymptomatic. Continue Cardizem and Eliquis and she has appointment with EP in the near future.  An After Visit Summary was printed and given to the patient.  FOLLOW UP: Return in about 3 months (around 05/20/2023) for routine chronic illness f/u. Next CPE February 2025. Signed:  Santiago Bumpers, MD           02/17/2023

## 2023-02-21 DIAGNOSIS — Z940 Kidney transplant status: Secondary | ICD-10-CM | POA: Diagnosis not present

## 2023-02-22 NOTE — Progress Notes (Signed)
Electrophysiology Office Note:    Date:  02/23/2023   ID:  AILED Green, DOB 02/28/1946, MRN [id number]  [facility] Cardiologist:  Tabitha Haws, MD  [facility] Electrophysiologist:  Tabitha Prude, MD   Referring MD: Tabitha Stade, MD   Chief Complaint: Atrial fibrillation  History of Present Illness:    [name] is a 77 y.o. femalewho I am seeing today for an evaluation of atrial fibrillation at the request of Dr. Eden Green.  The patient was last seen by Dr. Eden Green on February 16, 2023.  The patient has a medical history that includes renal transplant, hypertension, hyperlipidemia, hypothyroidism, atrial fibrillation, SVT.  She has previously been on Eliquis for stroke prophylaxis.  She tells me that she feels anxious when in atrial fibrillation.  She is always fatigued but hard to know whether or not this is related to her atrial fibrillation.  She takes her Eliquis reliably.  She is nervous about medications or procedures.       Their past medical, social and family history was reveiwed.   ROS:   Please see the history of present illness.    All other systems reviewed and are negative.  EKGs/Labs/Other Studies Reviewed:    The following studies were reviewed today:  Dec 28, 2022 echo EF 60% RV normal Moderately dilated left atrium Trivial MR  January 08, 2023 ZIO monitor personally reviewed   EKG Interpretation Date/Time:  Thursday February 23 2023 09:34:22 EDT Ventricular Rate:  85 PR Interval:    QRS Duration:  86 QT Interval:  372 QTC Calculation: 442 R Axis:   7  Text Interpretation: Atrial fibrillation Confirmed by Tabitha Green [phone]) on 02/23/2023 9:37:16 AM    Physical Exam:    VS:  BP 102/62   Pulse 85   Ht 5\' 5"  (1.651 m)   Wt 236 lb (107 kg)   SpO2 97%   BMI 39.27 kg/m     Wt Readings from Last 3 Encounters:  02/23/23 236 lb (107 kg)  02/17/23 235 lb 6.4 oz (106.8 kg)  02/16/23 231 lb 6.4 oz (105 kg)     GEN:  Well  nourished, well developed in no acute distress.  Morbidly obese CARDIAC: irregularly irregular, no murmurs, rubs, gallops RESPIRATORY:  Clear to auscultation without rales, wheezing or rhonchi       ASSESSMENT AND PLAN:    1. Paroxysmal atrial fibrillation (HCC)   2. Hypothyroidism, unspecified type   3. Primary hypertension     #Paroxysmal atrial fibrillation On Eliquis for stroke prophylaxis.  Continue verapamil. Discussed treatment options with the patient including rate control, antiarrhythmic drugs (Tikosyn) and catheter ablation.  She is understandably hesitant to pursue antiarrhythmic drugs or catheter ablation.  She is minimally symptomatic while in atrial fibrillation.  I suspect she is actually in persistent atrial fibrillation given my review of her [facility] tracings.  I think there are many of the tracings that are labeled a sinus rhythm that her actual atrial fibrillation or flutter.  For now, I have recommended rate control.  She should continue Eliquis.  We will plan to see her back in 6 months or so to reassess symptom burden.  Could also consider wearing a 2-week Zio patch after that appointment to reassess the burden of A-fib.  I would be hesitant to pursue catheter ablation until she has completed a sleep study.  Weight loss would also be beneficial.    #Hypothyroidism Continue Synthroid  #Hypertension At goal today.  Recommend checking blood pressures 1-2 times per week at home and recording the values.  Recommend bringing these recordings to the primary care physician.  Follow-up 6 months with APP    Signed, Tabitha Lang T. Lalla Brothers, MD, [facility], [facility] 02/23/2023 9:37 AM    Electrophysiology [facility]

## 2023-02-23 ENCOUNTER — Encounter: Payer: Self-pay | Admitting: Cardiology

## 2023-02-23 ENCOUNTER — Telehealth: Payer: Self-pay

## 2023-02-23 ENCOUNTER — Ambulatory Visit: Payer: Medicare Other | Attending: Internal Medicine | Admitting: Cardiology

## 2023-02-23 VITALS — BP 102/62 | HR 85 | Ht 65.0 in | Wt 236.0 lb

## 2023-02-23 DIAGNOSIS — I10 Essential (primary) hypertension: Secondary | ICD-10-CM

## 2023-02-23 DIAGNOSIS — I480 Paroxysmal atrial fibrillation: Secondary | ICD-10-CM

## 2023-02-23 DIAGNOSIS — E039 Hypothyroidism, unspecified: Secondary | ICD-10-CM | POA: Diagnosis not present

## 2023-02-23 NOTE — Patient Instructions (Signed)
Medication Instructions:  Your physician recommends that you continue on your current medications as directed. Please refer to the Current Medication list given to you today.  *If you need a refill on your cardiac medications before your next appointment, please call your pharmacy*  Testing/Procedures: Your physician has recommended that you have a sleep study. This test records several body functions during sleep, including: brain activity, eye movement, oxygen and carbon dioxide blood levels, heart rate and rhythm, breathing rate and rhythm, the flow of air through your mouth and nose, snoring, body muscle movements, and chest and belly movement.  Follow-Up: At [facility], you and your health needs are our priority.  As part of our continuing mission to provide you with exceptional heart care, we have created designated Provider Care Teams.  These Care Teams include your primary Cardiologist (physician) and Advanced Practice Providers (APPs -  Physician Assistants and Nurse Practitioners) who all work together to provide you with the care you need, when you need it.  Your next appointment:   6 month(s)  Provider:   You will see one of the following Advanced Practice Providers on your designated Care Team:   Francis Dowse, Charlott Holler [address] [place], [place] Sherie Don, NP Canary Brim, NP

## 2023-02-23 NOTE — Telephone Encounter (Signed)
Tabitha Green sleep study was ordered by Dr. Lalla Brothers and given to the patient today. She is aware not to open the device and complete the study until she is contacted by our office with the PIN#. Device has been registered.

## 2023-02-28 ENCOUNTER — Telehealth: Payer: Self-pay | Admitting: Cardiology

## 2023-02-28 ENCOUNTER — Encounter: Payer: Self-pay | Admitting: Family Medicine

## 2023-02-28 ENCOUNTER — Ambulatory Visit (INDEPENDENT_AMBULATORY_CARE_PROVIDER_SITE_OTHER): Payer: Medicare Other | Admitting: Family Medicine

## 2023-02-28 VITALS — BP 116/72 | HR 75 | Wt 238.0 lb

## 2023-02-28 DIAGNOSIS — I4891 Unspecified atrial fibrillation: Secondary | ICD-10-CM | POA: Diagnosis not present

## 2023-02-28 DIAGNOSIS — I10 Essential (primary) hypertension: Secondary | ICD-10-CM

## 2023-02-28 NOTE — Progress Notes (Signed)
OFFICE VISIT  02/28/2023  CC:  Chief Complaint  Patient presents with   Blood Pressure     Pt states her bp will fluctuate from high-low. Wants to discuss her bp medication    Patient is a 77 y.o. female who presents for heart rate concern. I last saw her on 02/17/2023. A/P as of that visit: "#1 generalized anxiety disorder. Stable.  Reassured.  Encouraged her to use her lorazepam as needed.   #2 hypertension, well-controlled on losartan 100 mg a day.   #3 chronic renal insufficiency stage III, history of renal transplant. Most recent serum creatinine was 1.0 on 12/29/22, GFR 58. She has nephrology follow-up in a couple weeks (Dr. Glenna Fellows).  She has previsit labs next week.   #4 hypothyroidism. TSH was over suppressed earlier this year but was back into normal range 2 months ago.  Cont one half of 125 mcg tab on 2 days a week and a whole tab on all other days. Recheck TSH next o/v in 3 mo.   #5 PAF, asymptomatic. Continue Cardizem and Eliquis and she has appointment with EP in the near future."  INTERIM HX: Tabitha Green is feeling fine but has noted some variation in her heart rate when she checks her blood pressure and O2 sat routinely. Anywhere from 50-70 typically.  She does not feel any different when in the 50s.  Denies palpitations or dizziness. Blood pressures have consistently been in the 1 teens to 120s.  She saw Dr. Lalla Brothers in cardiac electrophysiology on 02/23/2023. He kept her on verapamil and there is no plan for catheter ablation or antiarrhythmic drugs at this time. She will be getting set up for a sleep study.  Past Medical History:  Diagnosis Date   Anxiety    Arthritis    Atrial fibrillation (HCC)    Cholelithiasis 04/2021   noted on CT abd/pelv done for R lower abd swelling and MR abd   Chronic renal insufficiency, stage III (moderate) (HCC) 10/2015   Post-transplant baseline sCr 1.2-1.4.     CMV infection [facility]) summer 2017   Valcyte per ID/Renal transplant  team   Depression    Diverticulosis    a. 05/2012 colonoscopy   Erosive lichen planus of vulva    Topical steroids (managed by Dr. Robby Sermon [facility] via [facility].   ESRD (end stage renal disease) (HCC)    began dialysis 2014--followed by Dr. Lowell Guitar.  Received deceased donor kidney transplant 10/2015.   Fatty liver 10/2021   with hepatomegaly   FSGS (focal segmental glomerulosclerosis)    right kidney; hx of left renal cell cancer and got nephrectomy 1993.   GERD (gastroesophageal reflux disease)    Gout    Heart murmur    (Diastolic) ECHO 08/2013 showed that this murmur is coming from pt's R arm AV fistula   Hiatal hernia    History of renal cell cancer 1993   Left nephrectomy   Hyperlipidemia    Hypertension    Hypothyroidism    Impingement syndrome of left shoulder 04/2016   Dr. Bertram Savin to PT   Kidney cancer, primary, with metastasis from kidney to other site [facility])    Lumbar spinal stenosis    Chronic LBP with bilat neurogenic claudication   Metatarsal fracture, pathologic 01/2018   Right; orthocarolina-->post op shoe continued, f/u x-ray planned.   Obesity    Osteoarthritis of left knee 08/2017   severe, diffuse, tricompartmental.  Responded to steroid injection.     Osteoporosis  06/07/2016   DEXA T-score of -3.1.  Fosamax planned 2017 but dental work prevented start..  Pathological toe fx 12/2017--repeat DEXA 08/2018, T-score -3.3 radius. 12/2020 T score radius -3.7.   Pneumonia    Renal transplant recipient Dec 06, 2015   Baseline Cr as of 09/2020 1.2-1.4   Simple hepatic cyst    2022   Solitary kidney, acquired    SVT (supraventricular tachycardia)    UTI (urinary tract infection)    Ventral hernia    Vitamin B12 deficiency 12/2018   Starting replacement as of 12/11/2018    Past Surgical History:  Procedure Laterality Date   AV FISTULA PLACEMENT Right    Right arm: aneurismal dilatation 2017 being followed by CV surgeons    CARDIOVASCULAR STRESS TEST     03/10/15 ETT ([facility]): Exercise ECG negative at 83% max predicted HR.    CATARACT EXTRACTION     COLONOSCOPY  2003; 05/2012   diverticulosis, no polyps.  Recall 10 yrs   colonoscopy with polypectomy     Dr Marina Goodell   DEXA  06/07/2016; 08/22/2018; 12/30/20   2017 T-score -3.1.  2020 T score -3.3.  2022 T score -3.7   HYSTERECTOMY ABDOMINAL WITH SALPINGECTOMY     KIDNEY TRANSPLANT  2015-12-06   Deceased donor kidney transplant, with Thymoglobulin induction   LIGATION OF ARTERIOVENOUS  FISTULA Right 02/14/2017   Procedure: LIGATION OF ARTERIOVENOUS  FISTULA;  Surgeon: Chuck Hint, MD;  Location: [facility] OR;  Service: Vascular;  Laterality: Right;   NEPHRECTOMY  1993   for malignancy- left   RENAL BIOPSY     right   RESECTION OF ARTERIOVENOUS FISTULA ANEURYSM Right 02/14/2017   Procedure: EXCISION OF RIGHT BRACHIOCEPHALIC ARTERIOVENOUS FISTULA ANEURYSM;  Surgeon: Chuck Hint, MD;  Location: [facility] OR;  Service: Vascular;  Laterality: Right;   TEE WITHOUT CARDIOVERSION N/A 08/27/2013   Procedure: TRANSESOPHAGEAL ECHOCARDIOGRAM (TEE);  Surgeon: Wendall Stade, MD;  Location: [facility] ENDOSCOPY;  Service: Cardiovascular;  Laterality: N/A;   TOTAL ABDOMINAL HYSTERECTOMY W/ BILATERAL SALPINGOOPHORECTOMY  1997   fibroids   TRANSTHORACIC ECHOCARDIOGRAM     03/10/15 echo ([facility]): LV cavity normal in size, focal basal hypertrophy, normal systolic function, EF 55% (visual est). Normal wall motion, no regional wall motion abnormalities. Mild diastolic dysfunction with normal LA chamber size. No significant valve stenosis or regurgitation.  12/2022 normal   VULVA / PERINEUM BIOPSY  2015    Outpatient Medications Prior to Visit  Medication Sig Dispense Refill   acetaminophen (TYLENOL) 500 MG tablet Take 1,000 mg by mouth every 8 (eight) hours as needed for moderate pain.     apixaban (ELIQUIS) 5 MG TABS tablet Take 1 tablet (5 mg total) by  mouth 2 (two) times daily. 180 tablet 1   AYR SALINE NASAL DROPS NA Place 2 sprays into both nostrils daily.     Camphor-Eucalyptus-Menthol (VICKS VAPORUB EX) Apply 1 application topically daily.     Cyanocobalamin (B-12 PO) Take 1 tablet by mouth daily.     doxazosin (CARDURA) 2 MG tablet TAKE 1 TABLET(2 MG) BY MOUTH TWICE DAILY 180 tablet 3   hydrocortisone (ANUSOL-HC) 2.5 % rectal cream Place 1 Application rectally 2 (two) times daily. 30 g 2   levothyroxine (SYNTHROID) 125 MCG tablet 1/2 tab po on two days a week, whole tab all other days (Patient taking differently: Take 62.5-125 mcg by mouth See admin instructions. 1/2 tab po on two days a week Wed and Sat, and whole tab  all other days) 90 tablet 3   LORazepam (ATIVAN) 0.5 MG tablet Take 1 tablet (0.5 mg total) by mouth 2 (two) times daily as needed for anxiety. 30 tablet 1   losartan (COZAAR) 100 MG tablet Take 1 tablet (100 mg total) by mouth daily. 90 tablet 3   Magnesium Chloride (MAG64) 64 MG TBEC TAKE 2 TABLET BY MOUTH EVERY DAY 60 tablet 0   magnesium chloride (SLOW-MAG) 64 MG TBEC SR tablet Take 2 tablets (128 mg total) by mouth daily. (Patient taking differently: Take 3 tablets by mouth daily.) 60 tablet 1   mycophenolate (MYFORTIC) 360 MG TBEC EC tablet Take 360 mg by mouth 2 (two) times daily.     Na Sulfate-K Sulfate-Mg Sulf 17.5-3.13-1.6 GM/177ML SOLN Take by mouth once.     nystatin powder Apply 1 Application topically once a week.     silver sulfADIAZINE (SILVADENE) 1 % cream Apply 1 Application topically daily.     sodium bicarbonate 650 MG tablet Take 1 tablet (650 mg total) by mouth 2 (two) times daily. Takes 1 in AM and 1 at bedtime 180 tablet 3   tacrolimus (PROGRAF) 1 MG capsule Take 2 mg by mouth 2 (two) times daily.     triamcinolone cream (KENALOG) 0.1 % Apply 1 application topically daily.     verapamil (CALAN-SR) 120 MG CR tablet TAKE 1 TABLET BY MOUTH EVERY  AFTERNOON 90 tablet 2   verapamil (CALAN-SR) 240 MG CR  tablet Take 1 tablet (240 mg total) by mouth every morning. 90 tablet 3   Vitamin D, Ergocalciferol, (DRISDOL) 1.25 MG (50000 UNIT) CAPS capsule Take 1 capsule (50,000 Units total) by mouth every 7 (seven) days. 12 capsule 1   No facility-administered medications prior to visit.    Allergies  Allergen Reactions   Labetalol     Patient couldn't walk, low BP   Ceclor [Cefaclor] Rash   Naproxen Rash   Enalapril Maleate Cough    Vasotec   Metoprolol Tartrate Rash    On legs    Review of Systems As per HPI  PE:    02/28/2023    9:54 AM 02/23/2023    9:27 AM 02/17/2023    1:43 PM  Vitals with BMI  Height  5\' 5"    Weight 238 lbs 236 lbs 235 lbs 6 oz  BMI 39.61 39.27 39.17  Systolic 116 102 244  Diastolic 72 62 80  Pulse 75 85 63     Physical Exam  Gen: Alert, well appearing.  Patient is oriented to person, place, time, and situation. AFFECT: pleasant, lucid thought and speech. No further exam today  LABS:  Last CBC Lab Results  Component Value Date   WBC 7.7 12/29/2022   HGB 13.2 12/29/2022   HCT 40.6 12/29/2022   MCV 88.3 12/29/2022   MCH 28.7 12/29/2022   RDW 13.2 12/29/2022   PLT 231 12/29/2022   Last metabolic panel Lab Results  Component Value Date   GLUCOSE 106 (H) 12/29/2022   NA 139 12/29/2022   K 3.9 12/29/2022   CL 108 12/29/2022   CO2 23 12/29/2022   BUN 15 12/29/2022   CREATININE 1.00 12/29/2022   GFRNONAA 58 (L) 12/29/2022   CALCIUM 9.5 12/29/2022   PHOS 2.9 01/04/2018   PROT 6.6 11/20/2022   ALBUMIN 3.4 (L) 11/20/2022   BILITOT 0.6 11/20/2022   ALKPHOS 65 11/20/2022   AST 18 11/20/2022   ALT 13 11/20/2022   ANIONGAP 8 12/29/2022   Last lipids  Lab Results  Component Value Date   CHOL 148 09/13/2022   HDL 41.30 09/13/2022   LDLCALC 79 09/13/2022   LDLDIRECT 166.3 10/02/2007   TRIG 140.0 09/13/2022   CHOLHDL 4 09/13/2022   Last hemoglobin A1c Lab Results  Component Value Date   HGBA1C 5.4 02/23/2012   Last thyroid  functions Lab Results  Component Value Date   TSH 4.36 12/19/2022   IMPRESSION AND PLAN:  Atrial fibrillation with variation in heart rate.  Reassured patient this is normal. She will call, seek office visit, or go to the ER/call 911 if having low or high heart rate with significant symptoms such as acute fatigue, dizziness, severe palpitations, or presyncope.  Her blood pressures are staying normal. Reassurance today.  We reviewed her last labs from her nephrologist on her phone today. CBC completely normal.  CMET completely normal, including serum creatinine 1.07.  Urinalysis normal. Urine protein/creatinine 151.  An After Visit Summary was printed and given to the patient.  FOLLOW UP: No follow-ups on file.  Signed:  Santiago Bumpers, MD           02/28/2023

## 2023-02-28 NOTE — Telephone Encounter (Signed)
Patient is returning call, requesting to speak with Larita Fife.

## 2023-02-28 NOTE — Telephone Encounter (Signed)
**Note De-Identified  Obfuscation** No answer so I left a message on the pts VM asking her to call Larita Fife back at Dr Geannie Risen office at [phone].

## 2023-02-28 NOTE — Telephone Encounter (Signed)
**Note De-Identified  Obfuscation** I called the pt and provided her with the Pin #: "[id number]" so she can proceed with her [facility] -HST. She verbalized understanding thanked me for my call.

## 2023-02-28 NOTE — Telephone Encounter (Signed)
Prior Authorization for ITAMAR sent to [employer] via web portal. Tracking Number . READY- NO PA REQ 

## 2023-02-28 NOTE — Telephone Encounter (Signed)
**Note De-Identified  Obfuscation** Please see phone note from 02/23/23 in the pts chart for update.

## 2023-03-03 ENCOUNTER — Encounter: Payer: Self-pay | Admitting: Internal Medicine

## 2023-03-04 ENCOUNTER — Encounter: Payer: Self-pay | Admitting: Cardiology

## 2023-03-04 DIAGNOSIS — G4733 Obstructive sleep apnea (adult) (pediatric): Secondary | ICD-10-CM | POA: Diagnosis not present

## 2023-03-06 ENCOUNTER — Ambulatory Visit: Payer: Medicare Other | Attending: Cardiology

## 2023-03-06 DIAGNOSIS — I10 Essential (primary) hypertension: Secondary | ICD-10-CM

## 2023-03-06 DIAGNOSIS — E039 Hypothyroidism, unspecified: Secondary | ICD-10-CM

## 2023-03-06 DIAGNOSIS — I480 Paroxysmal atrial fibrillation: Secondary | ICD-10-CM

## 2023-03-06 NOTE — Procedures (Signed)
SLEEP STUDY REPORT Patient Information Study Date: 03/04/2023 Patient Name: Tabitha Green Patient ID: [id number] Birth Date: Jun 13, 1946 Age: 77 Gender: Female BMI: 39.3 (W=236 lb, H=5' 5'') Referring Physician: Steffanie Dunn, MD  TEST DESCRIPTION: Home sleep apnea testing was completed using the WatchPat, a Type 1 device, utilizing  peripheral arterial tonometry (PAT), chest movement, actigraphy, pulse oximetry, pulse rate, body position and snore.  AHI was calculated with apnea and hypopnea using valid sleep time as the denominator. RDI includes apneas,  hypopneas, and RERAs. The data acquired and the scoring of sleep and all associated events were performed in  accordance with the recommended standards and specifications as outlined in the AASM Manual for the Scoring of  Sleep and Associated Events 2.2.0 (2015).  FINDINGS:   1. Mild Obstructive Sleep Apnea with AHI 5.5/hr.   2. No Central Sleep Apnea with pAHIc 0.2/hr.   3. Oxygen desaturations as low as 82%.   4. Mild snoring was present. O2 sats were < 88% for 19.8 min.   5. Total sleep time was 6 hrs and 19 min.   6. 12.8% of total sleep time was spent in REM sleep.   7. Prolonged sleep onset latency at 31 min.   8. Prolonged REM sleep onset latency at 170 min.   9. Total awakenings were 13.  10. Arrhythmia detection: Suggestive of possible brief atrial fibrillation lasting 1 minutes and 21 seconds. This is not  diagnostic and further testing with outpatient telemetry monitoring is recommended.  DIAGNOSIS: Mild Obstructive Sleep Apnea (G47.33) Nocturnal Hypoxemia Possible Atrial fibrillation  RECOMMENDATIONS: 1. Clinical correlation of these findings is necessary. The decision to treat obstructive sleep apnea (OSA) is usually  based on the presence of apnea symptoms or the presence of associated medical conditions such as Hypertension,  Congestive Heart Failure, Atrial Fibrillation or Obesity. The most common symptoms  of OSA are snoring, gasping for  breath while sleeping, daytime sleepiness and fatigue.  2. Initiating apnea therapy is recommended given the presence of symptoms and/or associated conditions.  Recommend proceeding with one of the following:  a. Auto-CPAP therapy with a pressure range of 5-20cm H2O.  b. An oral appliance (OA) that can be obtained from certain dentists with expertise in sleep medicine. These are  primarily of use in non-obese patients with mild and moderate disease.  c. An ENT consultation which may be useful to look for specific causes of obstruction and possible treatment  options.  d. If patient is intolerant to PAP therapy, consider referral to ENT for evaluation for hypoglossal nerve stimulator.  3. Close follow-up is necessary to ensure success with CPAP or oral appliance therapy for maximum benefit . 4. A follow-up oximetry study on CPAP is recommended to assess the adequacy of therapy and determine the need  for supplemental oxygen or the potential need for Bi-level therapy. An arterial blood gas to determine the adequacy of  baseline ventilation and oxygenation should also be considered. 5. Healthy sleep recommendations include: adequate nightly sleep (normal 7-9 hrs/night), avoidance of caffeine after  noon and alcohol near bedtime, and maintaining a sleep environment that is cool, dark and quiet. 6. Weight loss for overweight patients is recommended. Even modest amounts of weight loss can significantly  improve the severity of sleep apnea. 7. Snoring recommendations include: weight loss where appropriate, side sleeping, and avoidance of alcohol before  bed. 8. Operation of motor vehicle should be avoided when sleepy.  Signature: Armanda Magic, MD; [facility]); Diplomat, American Board of  Sleep  Medicine Electronically Signed: 03/06/2023 8:41:29 AM

## 2023-03-10 ENCOUNTER — Encounter: Payer: Self-pay | Admitting: Internal Medicine

## 2023-03-10 ENCOUNTER — Ambulatory Visit (AMBULATORY_SURGERY_CENTER): Payer: Medicare Other | Admitting: Internal Medicine

## 2023-03-10 VITALS — BP 120/61 | HR 56 | Temp 98.2°F | Resp 12 | Ht 65.0 in | Wt 236.0 lb

## 2023-03-10 DIAGNOSIS — Z1211 Encounter for screening for malignant neoplasm of colon: Secondary | ICD-10-CM | POA: Diagnosis not present

## 2023-03-10 DIAGNOSIS — K625 Hemorrhage of anus and rectum: Secondary | ICD-10-CM

## 2023-03-10 DIAGNOSIS — R195 Other fecal abnormalities: Secondary | ICD-10-CM | POA: Diagnosis not present

## 2023-03-10 DIAGNOSIS — D124 Benign neoplasm of descending colon: Secondary | ICD-10-CM | POA: Diagnosis not present

## 2023-03-10 DIAGNOSIS — I10 Essential (primary) hypertension: Secondary | ICD-10-CM | POA: Diagnosis not present

## 2023-03-10 MED ORDER — SODIUM CHLORIDE 0.9 % IV SOLN: 500.0000 mL | Freq: Once | INTRAVENOUS | Status: DC

## 2023-03-10 NOTE — Progress Notes (Signed)
Expand All Collapse All        01/23/2023 Tabitha Green [id number] [birth year]   Referring provider: Jeoffrey Massed, MD Primary GI doctor: Dr. Marina Goodell   ASSESSMENT AND PLAN:    Rectal bleeding No anemia, + FOBT Last colon 2013 with diverticulosis Most recent bleeding more consistent with hemorrhoids, not appreciated on exam, will treat with steroid cream and better bowel habits With rebleeding, on eliquis now, and FOBT + suggest proceeding with colonoscopy to rule out malignancy, patient agreeable.  Should be appropriate at [facility] with Dr. Marina Goodell, will need to hold eliquis We have discussed the risks of bleeding, infection, perforation, medication reactions, and remote risk of death associated with colonoscopy. All questions were answered and the patient acknowledges these risk and wishes to proceed.   Paroxysmal atrial fibrillation (HCC) New onset 11/2022 Echo 12/2022 EF 60-65% without valve issues or wall most abnormalities Has follow up Dr. Eden Emms 02/2023 Patient told to hold her Eliquis for 2 days prior to time of procedure. Will instruct when and how to resume after procedure. We will communicate with her prescribing physician Dr. Eden Emms to ensure that holding his Eliquis is acceptable. We discussed the risk, benefits and alternatives to colonoscopy/endoscopy.  We also discussed the low but real risk of cardiovascular event such as heart attack, stroke, embolism, thrombosis or ischemia/infarct of other organs off Eliquis and explained the need to seek urgent help if this occurs.  She is agreeable and wishes to proceed.   Ventral hernia without obstruction or gangrene Discussed with patient No evidence of stragulation or issues and with size, doubt this will happen. Suggest weight loss, can try AB binder   Morbid obesity (HCC) Body mass index is 38.67 kg/m.  -Patient has been advised to make an attempt to improve diet and exercise patterns to aid in weight loss. -Recommended  diet heavy in fruits and veggies and low in animal meats, cheeses, and dairy products, appropriate calorie intake     Patient Care Team: Jeoffrey Massed, MD as PCP - General (Family Medicine) Hilarie Fredrickson, MD as Consulting Physician (Gastroenterology) Lenda Kelp, MD as Consulting Physician (Sports Medicine) Sherren Kerns, MD (Inactive) as Consulting Physician (Vascular Surgery) Chuck Hint, MD as Consulting Physician (Vascular Surgery) Malvin Johns as Physician Assistant (Orthopedic Surgery) [facility], Robby Sermon, MD as Consulting Physician (Dermatology) Tomi Bamberger, PA-C as Consulting Physician (Orthopedic Surgery) Tyler Pita, MD as Consulting Physician (Nephrology) Monica Becton, MD as Consulting Physician (Family Medicine) Cain Saupe, MD as Referring Physician (Ophthalmology) Ebony Cargo, MD as Consulting Physician (Transplant) Wendall Stade, MD as Consulting Physician (Cardiology)   HISTORY OF PRESENT ILLNESS: 77 y.o. female with a past medical history of end-stage renal disease was on dialysis since 2014 status post renal cell transplant 2017, CMV infection, cholelithiasis, diverticulosis, fatty liver, GERD, B12 deficiency, atrial fibrillation diagnosed 11/2022 on Eliquis 5 mg twice daily and others listed below presents for evaluation of BRB.    June 07, 2012 colonoscopy for history of colon polyps. She was found to have severe diverticulosis throughout the entire colon.  07/13/2022 office visit Dr. Marina Goodell for painless rectal bleeding was set up for colonoscopy 09/02/2022 but this was canceled, States her husband was sick at that time so she had to cancel.  11/2022 diagnosed with atrial fibrillation currently on Eliquis 5 mg twice daily ,has been going to Afib clinic, has follow up Dr. Eden Emms in July.  She states she is in and out of  Afib, thinks she can feel when she is in it, she will feel nervous, feels she is in it  today.  12/28/22 Echo EF 60-65%, normal valves 12/29/2022 hemoglobin 13.2, MCV 88.3, normal platelets normal WBC   She had one other episode of rectal bleeding, just on TP. No rectal pain.  She has a BM most days, every other day.  Most of the time easy with out straining, occ straining.  She has large right ventral hernia, no pain with it. No AB pain.  Possible low grade temp, no fevers, states her sinuses have been bothering her.    She denies NSAID use.  She denies ETOH use.   She denies tobacco use.  She denies drug use.     She  reports that she has never smoked. She has never used smokeless tobacco. She reports current alcohol use. She reports that she does not use drugs.   RELEVANT LABS AND IMAGING: CBC Labs (Brief)          Component Value Date/Time    WBC 7.7 12/29/2022 1437    RBC 4.60 12/29/2022 1437    HGB 13.2 12/29/2022 1437    HCT 40.6 12/29/2022 1437    PLT 231 12/29/2022 1437    MCV 88.3 12/29/2022 1437    MCH 28.7 12/29/2022 1437    MCHC 32.5 12/29/2022 1437    RDW 13.2 12/29/2022 1437    LYMPHSABS 2.2 11/20/2022 1317    MONOABS 0.5 11/20/2022 1317    EOSABS 0.2 11/20/2022 1317    BASOSABS 0.0 11/20/2022 1317      Recent Labs (within last 365 days)         Recent Labs    06/01/22 0000 06/06/22 1100 09/13/22 0927 11/20/22 1317 12/29/22 1437  HGB 12.6 13.0 13.3 13.3 13.2        CMP     Labs (Brief)          Component Value Date/Time    NA 139 12/29/2022 1437    NA 141 06/01/2022 0000    K 3.9 12/29/2022 1437    CL 108 12/29/2022 1437    CO2 23 12/29/2022 1437    GLUCOSE 106 (H) 12/29/2022 1437    BUN 15 12/29/2022 1437    BUN 17 06/01/2022 0000    CREATININE 1.00 12/29/2022 1437    CREATININE 3.61 (H) 09/16/2015 1515    CALCIUM 9.5 12/29/2022 1437    CALCIUM 8.4 08/30/2012 0819    PROT 6.6 11/20/2022 1317    ALBUMIN 3.4 (L) 11/20/2022 1317    AST 18 11/20/2022 1317    ALT 13 11/20/2022 1317    ALKPHOS 65 11/20/2022 1317     BILITOT 0.6 11/20/2022 1317    GFRNONAA 58 (L) 12/29/2022 1437    GFRAA 42 09/04/2020 0000          Latest Ref Rng & Units 11/20/2022    1:17 PM 09/13/2022    9:27 AM 08/12/2021    1:41 PM  Hepatic Function  Total Protein 6.5 - 8.1 g/dL 6.6  6.6  6.4   Albumin 3.5 - 5.0 g/dL 3.4  4.1  3.7   AST 15 - 41 U/L 18  18  13    ALT 0 - 44 U/L 13  15  10    Alk Phosphatase 38 - 126 U/L 65  69  76   Total Bilirubin 0.3 - 1.2 mg/dL 0.6  0.7  0.4   Bilirubin, Direct 0.0 - 0.2 mg/dL 0.1  Current Medications:    Current Outpatient Medications (Endocrine & Metabolic):    levothyroxine (SYNTHROID) 125 MCG tablet, 1/2 tab po on two days a week, whole tab all other days (Patient taking differently: Take 62.5-125 mcg by mouth See admin instructions. 1/2 tab po on two days a week Wed and Sat, and whole tab all other days)   Current Outpatient Medications (Cardiovascular):    doxazosin (CARDURA) 2 MG tablet, Take 1 tablet (2 mg total) by mouth 2 (two) times daily.   losartan (COZAAR) 100 MG tablet, Take 1 tablet (100 mg total) by mouth daily.   verapamil (CALAN-SR) 120 MG CR tablet, TAKE 1 TABLET BY MOUTH EVERY  AFTERNOON   verapamil (CALAN-SR) 240 MG CR tablet, Take 1 tablet (240 mg total) by mouth every morning.   Current Outpatient Medications (Respiratory):    AYR SALINE NASAL DROPS NA, Place 2 sprays into both nostrils daily.   Camphor-Eucalyptus-Menthol (VICKS VAPORUB EX), Apply 1 application topically daily.   Current Outpatient Medications (Analgesics):    acetaminophen (TYLENOL) 500 MG tablet, Take 1,000 mg by mouth every 8 (eight) hours as needed for moderate pain.   Current Outpatient Medications (Hematological):    apixaban (ELIQUIS) 5 MG TABS tablet, Take 1 tablet (5 mg total) by mouth 2 (two) times daily.   Cyanocobalamin (B-12 PO), Take 1 tablet by mouth daily.   Current Outpatient Medications (Other):    hydrocortisone (ANUSOL-HC) 2.5 % rectal cream, Place 1 Application  rectally 2 (two) times daily.   LORazepam (ATIVAN) 0.5 MG tablet, Take 1 tablet (0.5 mg total) by mouth 2 (two) times daily as needed for anxiety.   Magnesium Chloride (MAG64) 64 MG TBEC, TAKE 2 TABLET BY MOUTH EVERY DAY   magnesium chloride (SLOW-MAG) 64 MG TBEC SR tablet, Take 2 tablets (128 mg total) by mouth daily. (Patient taking differently: Take 3 tablets by mouth daily.)   mycophenolate (MYFORTIC) 360 MG TBEC EC tablet, Take 360 mg by mouth 2 (two) times daily.   nystatin powder, Apply 1 Application topically once a week.   sodium bicarbonate 650 MG tablet, Take 1 tablet (650 mg total) by mouth 2 (two) times daily. Takes 1 in AM and 1 at bedtime   tacrolimus (PROGRAF) 1 MG capsule, Take 2 mg by mouth 2 (two) times daily.   triamcinolone cream (KENALOG) 0.1 %, Apply 1 application topically daily.   Vitamin D, Ergocalciferol, (DRISDOL) 1.25 MG (50000 UNIT) CAPS capsule, Take 1 capsule (50,000 Units total) by mouth every 7 (seven) days.   Medical History:      Past Medical History:  Diagnosis Date   Anxiety     Arthritis     Atrial fibrillation (HCC)     Cholelithiasis 04/2021    noted on CT abd/pelv done for R lower abd swelling and MR abd   Chronic renal insufficiency, stage III (moderate) (HCC) 10/2015    Post-transplant baseline sCr 1.2-1.4.     CMV infection [facility]) summer 2017    Valcyte per ID/Renal transplant team   Depression     Diverticulosis      a. 05/2012 colonoscopy   Erosive lichen planus of vulva      Topical steroids (managed by Dr. Robby Sermon [facility] via Dr. [facility] outpt services.   ESRD (end stage renal disease) (HCC)      began dialysis 2014--followed by Dr. Lowell Guitar.  Received deceased donor kidney transplant 10/2015.   Fatty liver 10/2021    with hepatomegaly   FSGS (focal  segmental glomerulosclerosis)      right kidney; hx of left renal cell cancer and got nephrectomy 1993.   GERD (gastroesophageal reflux disease)     Gout     Heart murmur       (Diastolic) ECHO 08/2013 showed that this murmur is coming from pt's R arm AV fistula   Hiatal hernia     History of renal cell cancer 1993    Left nephrectomy   Hyperlipidemia     Hypertension     Hypothyroidism     Impingement syndrome of left shoulder 04/2016    Dr. Bertram Savin to PT   Kidney cancer, primary, with metastasis from kidney to other site [facility])     Lumbar spinal stenosis      Chronic LBP with bilat neurogenic claudication   Metatarsal fracture, pathologic 01/2018    Right; orthocarolina-->post op shoe continued, f/u x-ray planned.   Obesity     Osteoarthritis of left knee 08/2017    severe, diffuse, tricompartmental.  Responded to steroid injection.     Osteoporosis 06/07/2016    DEXA T-score of -3.1.  Fosamax planned 2017 but dental work prevented start..  Pathological toe fx 12/2017--repeat DEXA 08/2018, T-score -3.3 radius. 12/2020 T score radius -3.7.   Pneumonia     Renal transplant recipient 11/06/2015    Baseline Cr as of 09/2020 1.2-1.4   Simple hepatic cyst      2022   Solitary kidney, acquired     SVT (supraventricular tachycardia)     UTI (urinary tract infection)     Ventral hernia     Vitamin B12 deficiency 12/2018    Starting replacement as of 12/11/2018        Allergies:  Allergies       Allergies  Allergen Reactions   Labetalol        Patient couldn't walk, low BP   Ceclor [Cefaclor] Rash   Naproxen Rash   Enalapril Maleate Cough      Vasotec   Metoprolol Tartrate Rash      On legs        Surgical History:  She  has a past surgical history that includes Total abdominal hysterectomy w/ bilateral salpingoophorectomy (1997); Nephrectomy (1993); colonoscopy with polypectomy; AV fistula placement (Right); Renal biopsy; Colonoscopy (2003; 05/2012); TEE without cardioversion (N/A, 08/27/2013); Vulva / perineum biopsy (2015); Kidney transplant (11/06/2015); DEXA (06/07/2016; 08/22/2018; 12/30/20); Ligation of arteriovenous  fistula (Right,  02/14/2017); Resection of arteriovenous fistula aneurysm (Right, 02/14/2017); transthoracic echocardiogram; Cardiovascular stress test; Cataract extraction; and Hysterectomy abdominal with salpingectomy. Family History:  Her family history includes Cancer in her paternal aunt and sister; Deep vein thrombosis in her mother; Diabetes in her maternal grandfather; Fibroids in her daughter; Heart attack (age of onset: 104) in her father; Hypertension in her father and mother; Stroke in her paternal grandmother.   REVIEW OF SYSTEMS  : All other systems reviewed and negative except where noted in the History of Present Illness.   PHYSICAL EXAM: BP 128/80 (BP Location: Left Arm, Patient Position: Sitting, Cuff Size: Large)   Pulse 96   Ht 5\' 5"  (1.651 m)   Wt 232 lb 6 oz (105.4 kg)   BMI 38.67 kg/m  General Appearance: Well nourished, in no apparent distress. Head:   Normocephalic and atraumatic. Eyes:  sclerae anicteric,conjunctive pink  Respiratory: Respiratory effort normal, BS equal bilaterally without rales, rhonchi, wheezing. Cardio: Irreg irreg with no MRGs. Peripheral pulses intact.  Abdomen: Soft,  Obese large right sided ventral  hernia,active bowel sounds. No tenderness . Without guarding and Without rebound. No masses. Rectal: Normal external rectal exam, normal to decrased rectal tone, no internal hemorrhoids appreciated, no masses, non tender, soft brown stool, hemoccult Positive Musculoskeletal: Full ROM, Antalgic gait. Without edema. Skin:  Dry and intact without significant lesions or rashes Neuro: Alert and  oriented x4;  No focal deficits. Psych:  Cooperative. Normal mood and affect.      Doree Albee, PA-C 9:39 AM  Recent H&P as above.  No interval clinical change or change in physical exam.  Now for colonoscopy

## 2023-03-10 NOTE — Patient Instructions (Signed)
Resume Eliquis Monday 03/13/2023 in the AM YOU HAD AN ENDOSCOPIC PROCEDURE TODAY: Refer to the procedure report and other information in the discharge instructions given to you for any specific questions about what was found during the examination. If this information does not answer your questions, please call [place] office at [phone] to clarify.   YOU SHOULD EXPECT: Some feelings of bloating in the abdomen. Passage of more gas than usual. Walking can help get rid of the air that was put into your GI tract during the procedure and reduce the bloating. If you had a lower endoscopy (such as a colonoscopy or flexible sigmoidoscopy) you may notice spotting of blood in your stool or on the toilet paper. Some abdominal soreness may be present for a day or two, also.  DIET: Your first meal following the procedure should be a light meal and then it is ok to progress to your normal diet. A half-sandwich or bowl of soup is an example of a good first meal. Heavy or fried foods are harder to digest and may make you feel nauseous or bloated. Drink plenty of fluids but you should avoid alcoholic beverages for 24 hours. If you had a esophageal dilation, please see attached instructions for diet.    ACTIVITY: Your care partner should take you home directly after the procedure. You should plan to take it easy, moving slowly for the rest of the day. You can resume normal activity the day after the procedure however YOU SHOULD NOT DRIVE, use power tools, machinery or perform tasks that involve climbing or major physical exertion for 24 hours (because of the sedation medicines used during the test).   SYMPTOMS TO REPORT IMMEDIATELY: A gastroenterologist can be reached at any hour. Please call [phone]  for any of the following symptoms:  Following lower endoscopy (colonoscopy, flexible sigmoidoscopy) Excessive amounts of blood in the stool  Significant tenderness, worsening of abdominal pains  Swelling of the  abdomen that is new, acute  Fever of 100 or higher   FOLLOW UP:  If any biopsies were taken you will be contacted by phone or by letter within the next 1-3 weeks. Call [phone]  if you have not heard about the biopsies in 3 weeks.  Please also call with any specific questions about appointments or follow up tests.

## 2023-03-10 NOTE — Progress Notes (Signed)
Sedate, gd SR, tolerated procedure well, VSS, report to RN 

## 2023-03-10 NOTE — Op Note (Signed)
[facility] Patient Name: Tabitha Green Procedure Date: 03/10/2023 10:31 AM MRN: [id number] Endoscopist: Wilhemina Bonito. Marina Goodell , MD, [id number] Age: 77 Referring MD:  Date of Birth: Nov 11, 1945 Gender: Female Account #: [id number] Procedure:                Colonoscopy with hot snare polypectomy x 1;                            submucosal injection Indications:              Positive fecal immunochemical test, Rectal                            bleeding. Previous examination 2013 was negative                            for neoplasia Medicines:                Monitored Anesthesia Care Procedure:                Pre-Anesthesia Assessment:                           - Prior to the procedure, a History and Physical                            was performed, and patient medications and                            allergies were reviewed. The patient's tolerance of                            previous anesthesia was also reviewed. The risks                            and benefits of the procedure and the sedation                            options and risks were discussed with the patient.                            All questions were answered, and informed consent                            was obtained. Prior Anticoagulants: The patient has                            taken Eliquis (apixaban), last dose was 3 days                            prior to procedure. ASA Grade Assessment: III - A                            patient with severe systemic disease. After  reviewing the risks and benefits, the patient was                            deemed in satisfactory condition to undergo the                            procedure.                           After obtaining informed consent, the colonoscope                            was passed under direct vision. Throughout the                            procedure, the patient's blood pressure, pulse, and                             oxygen saturations were monitored continuously. The                            CF [id number] #[id number] was introduced through the anus                            and advanced to the the cecum, identified by                            appendiceal orifice and ileocecal valve. The                            ileocecal valve, appendiceal orifice, and rectum                            were photographed. The quality of the bowel                            preparation was good. The colonoscopy was performed                            without difficulty. The patient tolerated the                            procedure well. The bowel preparation used was                            SUPREP via split dose instruction. Scope In: 10:43:39 AM Scope Out: 11:01:11 AM Scope Withdrawal Time: 0 hours 10 minutes 47 seconds  Total Procedure Duration: 0 hours 17 minutes 32 seconds  Findings:                 A 25 mm polyp was found in the descending colon, at                            approximately 50 cm from the anal verge. The polyp  was pedunculated. The polyp and the polyp stalk                            were injected with 1-10,000 concentration                            epinephrine (total of 3 mm). The polyp was removed                            with a hot snare cautery. Resection and retrieval                            were complete. The area adjacent to the polyp was                            tattooed with carbon black. See images..                           Many diverticula were found in the entire colon.                            Internal hemorrhoids noted.                           The exam was otherwise without abnormality on                            direct and retroflexion views. Complications:            No immediate complications. Estimated blood loss:                            None. Estimated Blood Loss:     Estimated blood loss: none. Impression:               - One  25 mm polyp in the descending colon, removed                            with a hot snare as described. Resected and                            retrieved. Tattooed                           - Diverticulosis in the and higher colon. Internal                            hemorrhoids.                           - The examination was otherwise normal on direct                            and retroflexion views. Recommendation:           - Repeat colonoscopy date to be determined after  pending pathology results are reviewed for                            surveillance.                           - Resume Eliquis (apixaban) in 3 days at prior dose                            (Monday, 03/13/2023 in the AM).                           - Patient has a contact number available for                            emergencies. The signs and symptoms of potential                            delayed complications were discussed with the                            patient. Return to normal activities tomorrow.                            Written discharge instructions were provided to the                            patient.                           - Resume previous diet.                           - Continue present medications.                           - Await pathology results. Wilhemina Bonito. Marina Goodell, MD 03/10/2023 11:11:25 AM This report has been signed electronically.

## 2023-03-13 ENCOUNTER — Telehealth: Payer: Self-pay

## 2023-03-13 NOTE — Telephone Encounter (Signed)
  Follow up Call-     03/10/2023    9:50 AM  Call back number  Post procedure Call Back phone  # [phone]  Permission to leave phone message Yes     Patient questions:  Do you have a fever, pain , or abdominal swelling? No. Pain Score  0 *  Have you tolerated food without any problems? Yes.    Have you been able to return to your normal activities? Yes.    Do you have any questions about your discharge instructions: Diet   No. Medications  No. Follow up visit  No.  Do you have questions or concerns about your Care? No.  Actions: * If pain score is 4 or above: No action needed, pain <4.

## 2023-03-14 ENCOUNTER — Telehealth: Payer: Self-pay | Admitting: *Deleted

## 2023-03-14 DIAGNOSIS — G4733 Obstructive sleep apnea (adult) (pediatric): Secondary | ICD-10-CM

## 2023-03-14 DIAGNOSIS — I10 Essential (primary) hypertension: Secondary | ICD-10-CM

## 2023-03-14 NOTE — Telephone Encounter (Signed)
-----   Message from Armanda Magic sent at 03/06/2023  8:43 AM EDT ----- Order ResMed CPAP on auto from 4 to 15cm H2O with heated humidity, mask of choice

## 2023-03-14 NOTE — Telephone Encounter (Signed)
Please order an auto CPAP from 4-15cm H2O with heated humidity and mask of choice.  Order overnight pulse ox on CPAP.  Followup with me in 6 weeks.   The patient has been notified of the result. Left detailed message on voicemail and informed patient to call back. Latrelle Dodrill, CMA.

## 2023-03-15 ENCOUNTER — Telehealth: Payer: Self-pay | Admitting: Cardiology

## 2023-03-15 ENCOUNTER — Encounter: Payer: Self-pay | Admitting: Internal Medicine

## 2023-03-15 NOTE — Telephone Encounter (Signed)
Pt returning nurses call regarding sleep. Please advise .

## 2023-03-15 NOTE — Addendum Note (Signed)
Addended by: Reesa Chew on: 03/15/2023 05:49 PM   Modules accepted: Orders

## 2023-03-15 NOTE — Telephone Encounter (Addendum)
The patient has been notified of the result and verbalized understanding.  All questions (if any) were answered. Latrelle Dodrill, CMA 03/15/2023 5:42 PM.  Patient wants to think about her decision before moving forward with the cpap. Patient will call back later.

## 2023-03-15 NOTE — Telephone Encounter (Signed)
The patient has been notified of the result and verbalized understanding.  All questions (if any) were answered. Latrelle Dodrill, CMA 03/15/2023 5:41 PM .

## 2023-03-28 ENCOUNTER — Institutional Professional Consult (permissible substitution): Payer: Medicare Other | Admitting: Internal Medicine

## 2023-03-29 ENCOUNTER — Telehealth: Payer: Self-pay

## 2023-03-29 NOTE — Telephone Encounter (Signed)
Yes okay 

## 2023-03-29 NOTE — Telephone Encounter (Signed)
[employer] called to get approval from Dr. Milinda Cave. Rep stated that manufacture has changed on medication - levothyroxine (SYNTHROID) 125 MCG tablet .  Please call to okay medication change of manufacture.  [employer]  [phone] Ref # [id number]

## 2023-03-30 DIAGNOSIS — Z940 Kidney transplant status: Secondary | ICD-10-CM | POA: Diagnosis not present

## 2023-03-30 DIAGNOSIS — N2581 Secondary hyperparathyroidism of renal origin: Secondary | ICD-10-CM | POA: Diagnosis not present

## 2023-03-30 DIAGNOSIS — I4891 Unspecified atrial fibrillation: Secondary | ICD-10-CM | POA: Diagnosis not present

## 2023-03-30 DIAGNOSIS — I10 Essential (primary) hypertension: Secondary | ICD-10-CM | POA: Diagnosis not present

## 2023-03-30 DIAGNOSIS — K760 Fatty (change of) liver, not elsewhere classified: Secondary | ICD-10-CM | POA: Diagnosis not present

## 2023-03-30 DIAGNOSIS — D849 Immunodeficiency, unspecified: Secondary | ICD-10-CM | POA: Diagnosis not present

## 2023-03-30 DIAGNOSIS — Z79899 Other long term (current) drug therapy: Secondary | ICD-10-CM | POA: Diagnosis not present

## 2023-03-30 DIAGNOSIS — K469 Unspecified abdominal hernia without obstruction or gangrene: Secondary | ICD-10-CM | POA: Diagnosis not present

## 2023-03-30 DIAGNOSIS — B259 Cytomegaloviral disease, unspecified: Secondary | ICD-10-CM | POA: Diagnosis not present

## 2023-03-30 DIAGNOSIS — K769 Liver disease, unspecified: Secondary | ICD-10-CM | POA: Diagnosis not present

## 2023-03-30 DIAGNOSIS — N051 Unspecified nephritic syndrome with focal and segmental glomerular lesions: Secondary | ICD-10-CM | POA: Diagnosis not present

## 2023-03-30 NOTE — Telephone Encounter (Signed)
Form was also faxed and placed on PCP for signature. If provider signed, form should be in CMA bin near my desk

## 2023-03-31 ENCOUNTER — Encounter: Payer: Self-pay | Admitting: Family Medicine

## 2023-03-31 ENCOUNTER — Ambulatory Visit: Payer: Medicare Other | Admitting: Family Medicine

## 2023-03-31 VITALS — BP 136/82 | HR 65 | Wt 236.2 lb

## 2023-03-31 DIAGNOSIS — M549 Dorsalgia, unspecified: Secondary | ICD-10-CM

## 2023-03-31 DIAGNOSIS — M791 Myalgia, unspecified site: Secondary | ICD-10-CM | POA: Diagnosis not present

## 2023-03-31 NOTE — Progress Notes (Signed)
OFFICE VISIT  03/31/2023  CC:  Chief Complaint  Patient presents with   Shoulder Pain    2 weeks, Right shouler (scapula area); Pt states she feels a stinging sensation around the area, mostly feels the pain when moving. Denies any falls or anything to cause the pain. Tylenol for pain.     Patient is a 77 y.o. female who presents for shoulder and back pain.  HPI: Last few weeks has noticed a stinging type of pain in right scapular region.  Hurts with various movements.  Not a deep ache and it does not radiate up into the neck or down the arm or around the chest. No rash. She does hold her phone up in front of her quite a bit when playing games. The pain is not brought on by eating.  She has no nausea, shortness of breath, dizziness, diaphoresis, chest pain, or palpitations.   Past Medical History:  Diagnosis Date   Anemia    when I was on dyalysis   Anxiety    Arthritis    Atrial fibrillation (HCC)    Cataract    Cholelithiasis 04/2021   noted on CT abd/pelv done for R lower abd swelling and MR abd   Chronic renal insufficiency, stage III (moderate) (HCC) 10/2015   Post-transplant baseline sCr 1.2-1.4.     CMV infection [facility]) summer 2017   Valcyte per ID/Renal transplant team   Depression    Diverticulosis    a. 05/2012 colonoscopy   Erosive lichen planus of vulva    Topical steroids (managed by Dr. Robby Sermon [facility].   ESRD (end stage renal disease) (HCC)    began dialysis 2014--followed by Dr. Lowell Guitar.  Received deceased donor kidney transplant 10/2015.   Fatty liver 10/2021   with hepatomegaly   FSGS (focal segmental glomerulosclerosis)    right kidney; hx of left renal cell cancer and got nephrectomy 1993.   GERD (gastroesophageal reflux disease)    Gout    Heart murmur    (Diastolic) ECHO 08/2013 showed that this murmur is coming from pt's R arm AV fistula   Hiatal hernia    History of renal cell cancer 1993   Left  nephrectomy   Hyperlipidemia    Hypertension    Hypothyroidism    Impingement syndrome of left shoulder 04/2016   Dr. Bertram Savin to PT   Inguinal hernia    right   Kidney cancer, primary, with metastasis from kidney to other site [facility])    Lumbar spinal stenosis    Chronic LBP with bilat neurogenic claudication   Metatarsal fracture, pathologic 01/2018   Right; orthocarolina-->post op shoe continued, f/u x-ray planned.   Obesity    Osteoarthritis of left knee 08/2017   severe, diffuse, tricompartmental.  Responded to steroid injection.     Osteoporosis 06/07/2016   DEXA T-score of -3.1.  Fosamax planned 2017 but dental work prevented start..  Pathological toe fx 12/2017--repeat DEXA 08/2018, T-score -3.3 radius. 12/2020 T score radius -3.7.   Pneumonia    Renal transplant recipient 11/06/2015   Baseline Cr as of 09/2020 1.2-1.4   Simple hepatic cyst    2022   Solitary kidney, acquired    SVT (supraventricular tachycardia)    UTI (urinary tract infection)    Ventral hernia    Vitamin B12 deficiency 12/2018   Starting replacement as of 12/11/2018    Past Surgical History:  Procedure Laterality Date   AV FISTULA PLACEMENT Right  Right arm: aneurismal dilatation 2017 being followed by CV surgeons   CARDIOVASCULAR STRESS TEST     03/10/15 ETT ([facility]): Exercise ECG negative at 83% max predicted HR.    CATARACT EXTRACTION     COLONOSCOPY  2003; 05/2012   diverticulosis, no polyps.  Recall 10 yrs   colonoscopy with polypectomy     Dr Marina Goodell   DEXA  06/07/2016; 08/22/2018; 12/30/20   2017 T-score -3.1.  2020 T score -3.3.  2022 T score -3.7   HYSTERECTOMY ABDOMINAL WITH SALPINGECTOMY     KIDNEY TRANSPLANT  2015-12-02   Deceased donor kidney transplant, with Thymoglobulin induction   LIGATION OF ARTERIOVENOUS  FISTULA Right 02/14/2017   Procedure: LIGATION OF ARTERIOVENOUS  FISTULA;  Surgeon: Chuck Hint, MD;  Location: [facility] OR;  Service: Vascular;  Laterality:  Right;   NEPHRECTOMY  1993   for malignancy- left   RENAL BIOPSY     right   RESECTION OF ARTERIOVENOUS FISTULA ANEURYSM Right 02/14/2017   Procedure: EXCISION OF RIGHT BRACHIOCEPHALIC ARTERIOVENOUS FISTULA ANEURYSM;  Surgeon: Chuck Hint, MD;  Location: [facility] OR;  Service: Vascular;  Laterality: Right;   TEE WITHOUT CARDIOVERSION N/A 08/27/2013   Procedure: TRANSESOPHAGEAL ECHOCARDIOGRAM (TEE);  Surgeon: Wendall Stade, MD;  Location: [facility] ENDOSCOPY;  Service: Cardiovascular;  Laterality: N/A;   TOTAL ABDOMINAL HYSTERECTOMY W/ BILATERAL SALPINGOOPHORECTOMY  1997   fibroids   TRANSTHORACIC ECHOCARDIOGRAM     03/10/15 echo ([facility]): LV cavity normal in size, focal basal hypertrophy, normal systolic function, EF 55% (visual est). Normal wall motion, no regional wall motion abnormalities. Mild diastolic dysfunction with normal LA chamber size. No significant valve stenosis or regurgitation.  12/2022 normal   VULVA / PERINEUM BIOPSY  2015    Outpatient Medications Prior to Visit  Medication Sig Dispense Refill   acetaminophen (TYLENOL) 500 MG tablet Take 1,000 mg by mouth every 8 (eight) hours as needed for moderate pain.     apixaban (ELIQUIS) 5 MG TABS tablet Take 1 tablet (5 mg total) by mouth 2 (two) times daily. 180 tablet 1   AYR SALINE NASAL DROPS NA Place 2 sprays into both nostrils daily.     Camphor-Eucalyptus-Menthol (VICKS VAPORUB EX) Apply 1 application topically daily.     Cyanocobalamin (B-12 PO) Take 1 tablet by mouth daily.     doxazosin (CARDURA) 2 MG tablet TAKE 1 TABLET(2 MG) BY MOUTH TWICE DAILY 180 tablet 3   levothyroxine (SYNTHROID) 125 MCG tablet 1/2 tab po on two days a week, whole tab all other days (Patient taking differently: Take 62.5-125 mcg by mouth See admin instructions. 1/2 tab po on two days a week Wed and Sat, and whole tab all other days) 90 tablet 3   LORazepam (ATIVAN) 0.5 MG tablet Take 1 tablet (0.5 mg total) by mouth 2 (two)  times daily as needed for anxiety. 30 tablet 1   losartan (COZAAR) 100 MG tablet Take 1 tablet (100 mg total) by mouth daily. 90 tablet 3   magnesium chloride (SLOW-MAG) 64 MG TBEC SR tablet Take 2 tablets (128 mg total) by mouth daily. (Patient taking differently: Take 3 tablets by mouth daily.) 60 tablet 1   mycophenolate (MYFORTIC) 360 MG TBEC EC tablet Take 360 mg by mouth 2 (two) times daily.     nystatin powder Apply 1 Application topically once a week.     silver sulfADIAZINE (SILVADENE) 1 % cream Apply 1 Application topically daily.     tacrolimus (  PROGRAF) 1 MG capsule Take 2 mg by mouth 2 (two) times daily.     triamcinolone cream (KENALOG) 0.1 % Apply 1 application topically daily.     verapamil (CALAN-SR) 120 MG CR tablet TAKE 1 TABLET BY MOUTH EVERY  AFTERNOON 90 tablet 2   verapamil (CALAN-SR) 240 MG CR tablet Take 1 tablet (240 mg total) by mouth every morning. 90 tablet 3   Vitamin D, Ergocalciferol, (DRISDOL) 1.25 MG (50000 UNIT) CAPS capsule Take 1 capsule (50,000 Units total) by mouth every 7 (seven) days. 12 capsule 1   hydrocortisone (ANUSOL-HC) 2.5 % rectal cream Place 1 Application rectally 2 (two) times daily. (Patient not taking: Reported on 03/10/2023) 30 g 2   sodium bicarbonate 650 MG tablet Take 1 tablet (650 mg total) by mouth 2 (two) times daily. Takes 1 in AM and 1 at bedtime (Patient not taking: Reported on 03/31/2023) 180 tablet 3   No facility-administered medications prior to visit.    Allergies  Allergen Reactions   Labetalol     Patient couldn't walk, low BP   Ceclor [Cefaclor] Rash   Naproxen Rash   Enalapril Maleate Cough    Vasotec   Metoprolol Tartrate Rash    On legs    Review of Systems  As per HPI  PE:    03/31/2023   10:39 AM 03/31/2023   10:25 AM 03/10/2023   11:26 AM  Vitals with BMI  Weight  236 lbs 3 oz   BMI  39.31   Systolic 136 148 098  Diastolic 82 88 61  Pulse  65 56     Physical Exam  Exam chaperoned by female  CMA. Right upper back region with minimal discomfort to palpation in the general area of the scapula and just inferior to it.  No trigger point.  No skin hypersensitivity.  No rash.  LABS:  Last CBC Lab Results  Component Value Date   WBC 7.7 12/29/2022   HGB 13.2 12/29/2022   HCT 40.6 12/29/2022   MCV 88.3 12/29/2022   MCH 28.7 12/29/2022   RDW 13.2 12/29/2022   PLT 231 12/29/2022   Last metabolic panel Lab Results  Component Value Date   GLUCOSE 106 (H) 12/29/2022   NA 139 12/29/2022   K 3.9 12/29/2022   CL 108 12/29/2022   CO2 23 12/29/2022   BUN 15 12/29/2022   CREATININE 1.00 12/29/2022   GFRNONAA 58 (L) 12/29/2022   CALCIUM 9.5 12/29/2022   PHOS 2.9 01/04/2018   PROT 6.6 11/20/2022   ALBUMIN 3.4 (L) 11/20/2022   BILITOT 0.6 11/20/2022   ALKPHOS 65 11/20/2022   AST 18 11/20/2022   ALT 13 11/20/2022   ANIONGAP 8 12/29/2022   IMPRESSION AND PLAN:  Muscular pain, right upper back. Reassured. Discussed some adjustments in posture and muscle use that may help this.  Encouraged heat application and massage as well.  An After Visit Summary was printed and given to the patient.  FOLLOW UP: Return if symptoms worsen or fail to improve.  Signed:  Santiago Bumpers, MD           03/31/2023

## 2023-04-08 ENCOUNTER — Other Ambulatory Visit: Payer: Self-pay | Admitting: Family Medicine

## 2023-04-19 ENCOUNTER — Ambulatory Visit (HOSPITAL_COMMUNITY): Payer: Medicare Other | Admitting: Internal Medicine

## 2023-04-25 ENCOUNTER — Ambulatory Visit (INDEPENDENT_AMBULATORY_CARE_PROVIDER_SITE_OTHER): Payer: Medicare Other | Admitting: Internal Medicine

## 2023-04-25 ENCOUNTER — Encounter: Payer: Self-pay | Admitting: Internal Medicine

## 2023-04-25 VITALS — BP 122/78 | HR 76 | Ht 65.0 in | Wt 240.0 lb

## 2023-04-25 DIAGNOSIS — K625 Hemorrhage of anus and rectum: Secondary | ICD-10-CM

## 2023-04-25 DIAGNOSIS — Z860100 Personal history of colon polyps, unspecified: Secondary | ICD-10-CM

## 2023-04-25 DIAGNOSIS — R195 Other fecal abnormalities: Secondary | ICD-10-CM

## 2023-04-25 DIAGNOSIS — D124 Benign neoplasm of descending colon: Secondary | ICD-10-CM

## 2023-04-25 NOTE — Progress Notes (Signed)
HISTORY OF PRESENT ILLNESS:  Tabitha Green is a 77 y.o. female with multiple medical problems who underwent colonoscopy March 10, 2023 due to rectal bleeding and Hemoccult positive stool.  She was found to have a large pedunculated polyp in the descending colon which was removed with hot snare and tattooed.  Pathology revealed tubular adenoma.  No evidence of cancer.  She presents today for scheduled follow-up.  The patient had resumed Eliquis.  She has had no further issues with bleeding.  No GI complaints.  REVIEW OF SYSTEMS:  All non-GI ROS negative except for sinus and allergy trouble, anxiety, back pain, fatigue, heart rhythm change, itching, headaches, fever, sleeping problems, urinary leakage, shortness of breath  Past Medical History:  Diagnosis Date   Anemia    when I was on dyalysis   Anxiety    Arthritis    Atrial fibrillation (HCC)    Cataract    Cholelithiasis 04/2021   noted on CT abd/pelv done for R lower abd swelling and MR abd   Chronic renal insufficiency, stage III (moderate) (HCC) 10/2015   Post-transplant baseline sCr 1.2-1.4.     CMV infection [facility]) summer 2017   Valcyte per ID/Renal transplant team   Depression    Diverticulosis    a. 05/2012 colonoscopy   Erosive lichen planus of vulva    Topical steroids (managed by Dr. Robby [facility].   ESRD (end stage renal disease) (HCC)    began dialysis 2014--followed by Dr. Lowell Guitar.  Received deceased donor kidney transplant 10/2015.   Fatty liver 10/2021   with hepatomegaly   FSGS (focal segmental glomerulosclerosis)    right kidney; hx of left renal cell cancer and got nephrectomy 1993.   GERD (gastroesophageal reflux disease)    Gout    Heart murmur    (Diastolic) ECHO 08/2013 showed that this murmur is coming from pt's R arm AV fistula   Hiatal hernia    History of renal cell cancer 1993   Left nephrectomy   Hyperlipidemia    Hypertension    Hypothyroidism     Impingement syndrome of left shoulder 04/2016   Dr. Bertram Savin to PT   Inguinal hernia    right   Kidney cancer, primary, with metastasis from kidney to other site [facility])    Lumbar spinal stenosis    Chronic LBP with bilat neurogenic claudication   Metatarsal fracture, pathologic 01/2018   Right; orthocarolina-->post op shoe continued, f/u x-ray planned.   Obesity    Osteoarthritis of left knee 08/2017   severe, diffuse, tricompartmental.  Responded to steroid injection.     Osteoporosis 06/07/2016   DEXA T-score of -3.1.  Fosamax planned 2017 but dental work prevented start..  Pathological toe fx 12/2017--repeat DEXA 08/2018, T-score -3.3 radius. 12/2020 T score radius -3.7.   Pneumonia    Renal transplant recipient 11/06/2015   Baseline Cr as of 09/2020 1.2-1.4   Simple hepatic cyst    2022   Solitary kidney, acquired    SVT (supraventricular tachycardia)    UTI (urinary tract infection)    Ventral hernia    Vitamin B12 deficiency 12/2018   Starting replacement as of 12/11/2018    Past Surgical History:  Procedure Laterality Date   AV FISTULA PLACEMENT Right    Right arm: aneurismal dilatation 2017 being followed by CV surgeons   CARDIOVASCULAR STRESS TEST     03/10/15 ETT ([facility]): Exercise ECG negative at 83% max predicted HR.  CATARACT EXTRACTION     COLONOSCOPY  2003; 05/2012   diverticulosis, no polyps.  Recall 10 yrs   colonoscopy with polypectomy     Dr Marina Goodell   DEXA  06/07/2016; 08/22/2018; 12/30/20   2017 T-score -3.1.  2020 T score -3.3.  2022 T score -3.7   HYSTERECTOMY ABDOMINAL WITH SALPINGECTOMY     KIDNEY TRANSPLANT  11-29-15   Deceased donor kidney transplant, with Thymoglobulin induction   LIGATION OF ARTERIOVENOUS  FISTULA Right 02/14/2017   Procedure: LIGATION OF ARTERIOVENOUS  FISTULA;  Surgeon: Chuck Hint, MD;  Location: [facility] OR;  Service: Vascular;  Laterality: Right;   NEPHRECTOMY  1993   for malignancy- left   RENAL BIOPSY      right   RESECTION OF ARTERIOVENOUS FISTULA ANEURYSM Right 02/14/2017   Procedure: EXCISION OF RIGHT BRACHIOCEPHALIC ARTERIOVENOUS FISTULA ANEURYSM;  Surgeon: Chuck Hint, MD;  Location: [facility] OR;  Service: Vascular;  Laterality: Right;   TEE WITHOUT CARDIOVERSION N/A 08/27/2013   Procedure: TRANSESOPHAGEAL ECHOCARDIOGRAM (TEE);  Surgeon: Wendall Stade, MD;  Location: [facility] ENDOSCOPY;  Service: Cardiovascular;  Laterality: N/A;   TOTAL ABDOMINAL HYSTERECTOMY W/ BILATERAL SALPINGOOPHORECTOMY  1997   fibroids   TRANSTHORACIC ECHOCARDIOGRAM     03/10/15 echo ([facility]): LV cavity normal in size, focal basal hypertrophy, normal systolic function, EF 55% (visual est). Normal wall motion, no regional wall motion abnormalities. Mild diastolic dysfunction with normal LA chamber size. No significant valve stenosis or regurgitation.  12/2022 normal   VULVA / PERINEUM BIOPSY  2015    Social History [name]  reports that she has never smoked. She has never used smokeless tobacco. She reports current alcohol use. She reports that she does not use drugs.  family history includes Cancer in her paternal aunt and sister; Deep vein thrombosis in her mother; Diabetes in her maternal grandfather; Fibroids in her daughter; Heart attack (age of onset: 77) in her father; Hypertension in her father and mother; Stroke in her paternal grandmother.  Allergies  Allergen Reactions   Labetalol     Patient couldn't walk, low BP   Ceclor [Cefaclor] Rash   Naproxen Rash   Enalapril Maleate Cough    Vasotec   Metoprolol Tartrate Rash    On legs       PHYSICAL EXAMINATION: Vital signs: BP 122/78   Pulse 76   Ht 5\' 5"  (1.651 m)   Wt 240 lb (108.9 kg)   BMI 39.94 kg/m  General: Well-developed, well-nourished, no acute distress HEENT: Anicteric Abdomen: Not reexamined Psychiatric: alert and oriented x3. Cooperative   ASSESSMENT:  1.  Recent problems with rectal bleeding and  Hemoccult positive stool due to large colonic adenoma status postresection 2.  Multiple medical problems   PLAN:  1.  We discussed in detail today the findings on her colonoscopy.  We reviewed pathology.  We discussed surveillance guidelines.  To this end, we would consider repeat surveillance colonoscopy in 3 years depending upon her overall health status and willingness.

## 2023-04-25 NOTE — Patient Instructions (Signed)
Please follow up in one year.  _______________________________________________________  If your blood pressure at your visit was 140/90 or greater, please contact your primary care physician to follow up on this.  _______________________________________________________  If you are age 77 or older, your body mass index should be between 23-30. Your Body mass index is 39.94 kg/m. If this is out of the aforementioned range listed, please consider follow up with your Primary Care Provider.  If you are age 53 or younger, your body mass index should be between 19-25. Your Body mass index is 39.94 kg/m. If this is out of the aformentioned range listed, please consider follow up with your Primary Care Provider.   ________________________________________________________  The [place] [facility] providers would like to encourage you to use [facility] to communicate with providers for non-urgent requests or questions.  Due to long hold times on the telephone, sending your provider a message by [facility] may be a faster and more efficient way to get a response.  Please allow 48 business hours for a response.  Please remember that this is for non-urgent requests.  _______________________________________________________

## 2023-04-29 ENCOUNTER — Other Ambulatory Visit: Payer: Self-pay | Admitting: Family Medicine

## 2023-05-01 ENCOUNTER — Encounter: Payer: Self-pay | Admitting: Family Medicine

## 2023-05-01 ENCOUNTER — Other Ambulatory Visit: Payer: Self-pay

## 2023-05-02 ENCOUNTER — Other Ambulatory Visit: Payer: Self-pay

## 2023-05-02 MED ORDER — MAGNESIUM CHLORIDE 64 MG PO TBEC: 2.0000 | DELAYED_RELEASE_TABLET | Freq: Every day | ORAL | 0 refills | Status: DC

## 2023-05-02 NOTE — Telephone Encounter (Signed)
No further action needed.

## 2023-05-03 ENCOUNTER — Other Ambulatory Visit: Payer: Self-pay | Admitting: Family Medicine

## 2023-05-03 DIAGNOSIS — L299 Pruritus, unspecified: Secondary | ICD-10-CM | POA: Diagnosis not present

## 2023-05-03 DIAGNOSIS — Z1231 Encounter for screening mammogram for malignant neoplasm of breast: Secondary | ICD-10-CM

## 2023-05-03 DIAGNOSIS — L821 Other seborrheic keratosis: Secondary | ICD-10-CM | POA: Diagnosis not present

## 2023-05-03 DIAGNOSIS — T490X5A Adverse effect of local antifungal, anti-infective and anti-inflammatory drugs, initial encounter: Secondary | ICD-10-CM | POA: Diagnosis not present

## 2023-05-03 DIAGNOSIS — L304 Erythema intertrigo: Secondary | ICD-10-CM | POA: Diagnosis not present

## 2023-05-03 DIAGNOSIS — L909 Atrophic disorder of skin, unspecified: Secondary | ICD-10-CM | POA: Diagnosis not present

## 2023-05-05 ENCOUNTER — Other Ambulatory Visit: Payer: Self-pay | Admitting: Family Medicine

## 2023-05-05 ENCOUNTER — Encounter: Payer: Self-pay | Admitting: Family Medicine

## 2023-05-06 ENCOUNTER — Other Ambulatory Visit: Payer: Self-pay | Admitting: Family Medicine

## 2023-05-10 ENCOUNTER — Other Ambulatory Visit: Payer: Self-pay | Admitting: Family Medicine

## 2023-05-11 ENCOUNTER — Ambulatory Visit (INDEPENDENT_AMBULATORY_CARE_PROVIDER_SITE_OTHER): Payer: Medicare Other | Admitting: Family Medicine

## 2023-05-11 ENCOUNTER — Encounter: Payer: Self-pay | Admitting: Family Medicine

## 2023-05-11 VITALS — BP 136/80 | HR 68 | Temp 98.9°F | Ht 65.0 in | Wt 242.4 lb

## 2023-05-11 DIAGNOSIS — L299 Pruritus, unspecified: Secondary | ICD-10-CM

## 2023-05-11 DIAGNOSIS — E039 Hypothyroidism, unspecified: Secondary | ICD-10-CM

## 2023-05-11 DIAGNOSIS — N1831 Chronic kidney disease, stage 3a: Secondary | ICD-10-CM

## 2023-05-11 DIAGNOSIS — I480 Paroxysmal atrial fibrillation: Secondary | ICD-10-CM | POA: Diagnosis not present

## 2023-05-11 DIAGNOSIS — Z23 Encounter for immunization: Secondary | ICD-10-CM

## 2023-05-11 DIAGNOSIS — F411 Generalized anxiety disorder: Secondary | ICD-10-CM | POA: Diagnosis not present

## 2023-05-11 DIAGNOSIS — E559 Vitamin D deficiency, unspecified: Secondary | ICD-10-CM | POA: Diagnosis not present

## 2023-05-11 LAB — BASIC METABOLIC PANEL
BUN: 15 mg/dL (ref 6–23)
CO2: 28 meq/L (ref 19–32)
Calcium: 9.5 mg/dL (ref 8.4–10.5)
Chloride: 107 meq/L (ref 96–112)
Creatinine, Ser: 0.91 mg/dL (ref 0.40–1.20)
GFR: 60.77 mL/min (ref >60.00)
Glucose, Bld: 93 mg/dL (ref 70–99)
Potassium: 4.4 meq/L (ref 3.5–5.1)
Sodium: 141 meq/L (ref 135–145)

## 2023-05-11 LAB — TSH: TSH: 8.93 u[IU]/mL — ABNORMAL HIGH (ref 0.35–5.50)

## 2023-05-11 LAB — VITAMIN D 25 HYDROXY (VIT D DEFICIENCY, FRACTURES): VITD: 22.47 ng/mL — ABNORMAL LOW (ref 30.00–100.00)

## 2023-05-11 MED ORDER — MAGNESIUM CHLORIDE 64 MG PO TBEC: 2.0000 | DELAYED_RELEASE_TABLET | Freq: Every day | ORAL | 1 refills | Status: DC

## 2023-05-11 NOTE — Progress Notes (Signed)
OFFICE VISIT  05/11/2023  CC:  Chief Complaint  Patient presents with   Itching    States it is occurring all over, off and on for 2-3 months. Has Benadryl at home but did not take any. Unsure if allergic to something. Recent change in shampoo from Suave to Pantene (1 month)    Patient is a 77 y.o. female who presents for follow-up hypertension, generalized anxiety disorder, chronic renal insufficiency stage III, and PAF. A/P as of last visit: "#1 generalized anxiety disorder. Stable.  Reassured.  Encouraged her to use her lorazepam as needed.   #2 hypertension, well-controlled on losartan 100 mg a day.   #3 chronic renal insufficiency stage III, history of renal transplant. Most recent serum creatinine was 1.0 on 12/29/22, GFR 58. She has nephrology follow-up in a couple weeks (Dr. Glenna Fellows).  She has previsit labs next week.   #4 hypothyroidism. TSH was over suppressed earlier this year but was back into normal range 2 months ago.  Cont one half of 125 mcg tab on 2 days a week and a whole tab on all other days. Recheck TSH next o/v in 3 mo.   #5 PAF, asymptomatic. Continue Cardizem and Eliquis and she has appointment with EP in the near future."  INTERIM HX: Feeling well.  She intermittently feels a generalized itching sensation.  She has no swelling of her tongue, lips, or eyes.  She does not develop a rash.  She does not monitor blood pressure at home. She feels no palpitations or racing heart. Energy level is good.  Rarely uses lorazepam for her anxiety. PMP AWARE reviewed today: most recent rx for lorazepam was filled 12/23/22, # 30, rx by me. No red flags.  ROS as above, plus--> no fevers, no CP, no SOB, no wheezing, no cough, no dizziness, no HAs,  no melena/hematochezia.  No polyuria or polydipsia.  No myalgias or arthralgias.  No focal weakness, paresthesias, or tremors.  No acute vision or hearing abnormalities.  No dysuria or unusual/new urinary urgency or frequency.   No leg pain. No n/v/d or abd pain.   Past Medical History:  Diagnosis Date   Anemia    when I was on dyalysis   Anxiety    Arthritis    Atrial fibrillation (HCC)    Cataract    Cholelithiasis 04/2021   noted on CT abd/pelv done for R lower abd swelling and MR abd   Chronic renal insufficiency, stage III (moderate) (HCC) 10/2015   Post-transplant baseline sCr 1.2-1.4.     CMV infection [facility]) summer 2017   Valcyte per ID/Renal transplant team   Depression    Diverticulosis    a. 05/2012 colonoscopy   Erosive lichen planus of vulva    Topical steroids (managed by Dr. Robby [facility].   ESRD (end stage renal disease) (HCC)    began dialysis 2014--followed by Dr. Lowell Guitar.  Received deceased donor kidney transplant 10/2015.   Fatty liver 10/2021   with hepatomegaly   FSGS (focal segmental glomerulosclerosis)    right kidney; hx of left renal cell cancer and got nephrectomy 1993.   GERD (gastroesophageal reflux disease)    Gout    Heart murmur    (Diastolic) ECHO 08/2013 showed that this murmur is coming from pt's R arm AV fistula   Hiatal hernia    History of renal cell cancer 1993   Left nephrectomy   Hyperlipidemia    Hypertension    Hypothyroidism  Impingement syndrome of left shoulder 04/2016   Dr. Bertram Savin to PT   Inguinal hernia    right   Kidney cancer, primary, with metastasis from kidney to other site [facility])    Lumbar spinal stenosis    Chronic LBP with bilat neurogenic claudication   Metatarsal fracture, pathologic 01/2018   Right; orthocarolina-->post op shoe continued, f/u x-ray planned.   Obesity    Osteoarthritis of left knee 08/2017   severe, diffuse, tricompartmental.  Responded to steroid injection.     Osteoporosis 06/07/2016   DEXA T-score of -3.1.  Fosamax planned 2017 but dental work prevented start..  Pathological toe fx 12/2017--repeat DEXA 08/2018, T-score -3.3 radius. 12/2020 T score radius  -3.7.   Pneumonia    Renal transplant recipient Nov 19, 2015   Baseline Cr as of 09/2020 1.2-1.4   Simple hepatic cyst    2022   Solitary kidney, acquired    SVT (supraventricular tachycardia) (HCC)    UTI (urinary tract infection)    Ventral hernia    Vitamin B12 deficiency 12/2018   Starting replacement as of 12/11/2018    Past Surgical History:  Procedure Laterality Date   AV FISTULA PLACEMENT Right    Right arm: aneurismal dilatation 2017 being followed by CV surgeons   CARDIOVASCULAR STRESS TEST     03/10/15 ETT ([facility]): Exercise ECG negative at 83% max predicted HR.    CATARACT EXTRACTION     COLONOSCOPY  2003; 05/2012   diverticulosis, no polyps.  Recall 10 yrs   colonoscopy with polypectomy     Dr Marina Goodell   DEXA  06/07/2016; 08/22/2018; 12/30/20   2017 T-score -3.1.  2020 T score -3.3.  2022 T score -3.7   HYSTERECTOMY ABDOMINAL WITH SALPINGECTOMY     KIDNEY TRANSPLANT  11-19-2015   Deceased donor kidney transplant, with Thymoglobulin induction   LIGATION OF ARTERIOVENOUS  FISTULA Right 02/14/2017   Procedure: LIGATION OF ARTERIOVENOUS  FISTULA;  Surgeon: Chuck Hint, MD;  Location[facility] OR;  Service: Vascular;  Laterality: Right;   NEPHRECTOMY  1993   for malignancy- left   RENAL BIOPSY     right   RESECTION OF ARTERIOVENOUS FISTULA ANEURYSM Right 02/14/2017   Procedure: EXCISION OF RIGHT BRACHIOCEPHALIC ARTERIOVENOUS FISTULA ANEURYSM;  Surgeon: Chuck Hint, MD;  Location[facility] OR;  Service: Vascular;  Laterality: Right;   TEE WITHOUT CARDIOVERSION N/A 08/27/2013   Procedure: TRANSESOPHAGEAL ECHOCARDIOGRAM (TEE);  Surgeon: Wendall Stade, MD;  Location: [facility] ENDOSCOPY;  Service: Cardiovascular;  Laterality: N/A;   TOTAL ABDOMINAL HYSTERECTOMY W/ BILATERAL SALPINGOOPHORECTOMY  1997   fibroids   TRANSTHORACIC ECHOCARDIOGRAM     03/10/15 echo ([facility]): LV cavity normal in size, focal basal hypertrophy, normal systolic function, EF 55%  (visual est). Normal wall motion, no regional wall motion abnormalities. Mild diastolic dysfunction with normal LA chamber size. No significant valve stenosis or regurgitation.  12/2022 normal   VULVA / PERINEUM BIOPSY  2015    Outpatient Medications Prior to Visit  Medication Sig Dispense Refill   acetaminophen (TYLENOL) 500 MG tablet Take 1,000 mg by mouth every 8 (eight) hours as needed for moderate pain.     apixaban (ELIQUIS) 5 MG TABS tablet Take 1 tablet (5 mg total) by mouth 2 (two) times daily. 180 tablet 1   AYR SALINE NASAL DROPS NA Place 2 sprays into both nostrils daily.     Camphor-Eucalyptus-Menthol (VICKS VAPORUB EX) Apply 1 application topically daily.     Cyanocobalamin (B-12  PO) Take 1 tablet by mouth daily.     doxazosin (CARDURA) 2 MG tablet TAKE 1 TABLET(2 MG) BY MOUTH TWICE DAILY 180 tablet 3   hydrocortisone (ANUSOL-HC) 2.5 % rectal cream Place 1 Application rectally 2 (two) times daily. 30 g 2   levothyroxine (SYNTHROID) 125 MCG tablet 1/2 tab po on two days a week, whole tab all other days (Patient taking differently: Take 62.5-125 mcg by mouth See admin instructions. 1/2 tab po on two days a week Wed and Sat, and whole tab all other days) 90 tablet 3   LORazepam (ATIVAN) 0.5 MG tablet Take 1 tablet (0.5 mg total) by mouth 2 (two) times daily as needed for anxiety. 30 tablet 1   losartan (COZAAR) 100 MG tablet TAKE 1 TABLET BY MOUTH DAILY 90 tablet 1   mycophenolate (MYFORTIC) 360 MG TBEC EC tablet Take 360 mg by mouth 2 (two) times daily.     nystatin powder Apply 1 Application topically once a week.     silver sulfADIAZINE (SILVADENE) 1 % cream Apply 1 Application topically daily.     sodium bicarbonate 650 MG tablet Take 1 tablet (650 mg total) by mouth 2 (two) times daily. Takes 1 in AM and 1 at bedtime 180 tablet 3   tacrolimus (PROGRAF) 1 MG capsule Take 2 mg by mouth 2 (two) times daily.     triamcinolone cream (KENALOG) 0.1 % Apply 1 application topically daily.      verapamil (CALAN-SR) 120 MG CR tablet TAKE 1 TABLET BY MOUTH EVERY  AFTERNOON 90 tablet 0   verapamil (CALAN-SR) 240 MG CR tablet TAKE 1 TABLET BY MOUTH EVERY  MORNING 90 tablet 1   Vitamin D, Ergocalciferol, (DRISDOL) 1.25 MG (50000 UNIT) CAPS capsule Take 1 capsule (50,000 Units total) by mouth every 7 (seven) days. 12 capsule 1   magnesium chloride (SLOW-MAG) 64 MG TBEC SR tablet Take 2 tablets (128 mg total) by mouth daily. 60 tablet 0   No facility-administered medications prior to visit.    Allergies  Allergen Reactions   Labetalol     Patient couldn't walk, low BP   Ceclor [Cefaclor] Rash   Naproxen Rash   Enalapril Maleate Cough    Vasotec   Metoprolol Tartrate Rash    On legs    Review of Systems As per HPI  PE:    05/11/2023    8:29 AM 05/11/2023    8:21 AM 04/25/2023   10:50 AM  Vitals with BMI  Height  5\' 5"  5\' 5"   Weight  242 lbs 6 oz 240 lbs  BMI  40.34 39.94  Systolic 136 150 132  Diastolic 80 88 78  Pulse  68 76     Physical Exam  Gen: Alert, well appearing.  Patient is oriented to person, place, time, and situation. CV: RRR, no m/r/g.   LUNGS: CTA bilat, nonlabored resps, good aeration in all lung fields. EXT: 2 + bilat LE pitting edema, L>R. Skin: Mild pinkish macular rash over the medial aspect of the left ankle, poorly defined borders.  Nontender. No streaking.  There is a small bruise at the superior aspect of this.  Mild soft tissues swelling near the medial malleolus.  Ankle nontender.  Range of motion fully intact.  No calf tenderness.  LABS:  Last CBC Lab Results  Component Value Date   WBC 7.7 12/29/2022   HGB 13.2 12/29/2022   HCT 40.6 12/29/2022   MCV 88.3 12/29/2022   MCH 28.7 12/29/2022  RDW 13.2 12/29/2022   PLT 231 12/29/2022   Last metabolic panel Lab Results  Component Value Date   GLUCOSE 106 (H) 12/29/2022   NA 139 12/29/2022   K 3.9 12/29/2022   CL 108 12/29/2022   CO2 23 12/29/2022   BUN 15 12/29/2022    CREATININE 1.00 12/29/2022   GFRNONAA 58 (L) 12/29/2022   CALCIUM 9.5 12/29/2022   PHOS 2.9 01/04/2018   PROT 6.6 11/20/2022   ALBUMIN 3.4 (L) 11/20/2022   BILITOT 0.6 11/20/2022   ALKPHOS 65 11/20/2022   AST 18 11/20/2022   ALT 13 11/20/2022   ANIONGAP 8 12/29/2022   Last lipids Lab Results  Component Value Date   CHOL 148 09/13/2022   HDL 41.30 09/13/2022   LDLCALC 79 09/13/2022   LDLDIRECT 166.3 10/02/2007   TRIG 140.0 09/13/2022   CHOLHDL 4 09/13/2022   Last hemoglobin A1c Lab Results  Component Value Date   HGBA1C 5.4 02/23/2012   Last thyroid functions Lab Results  Component Value Date   TSH 4.36 12/19/2022   Last vitamin D Lab Results  Component Value Date   VD25OH 14.49 (L) 09/13/2022   Last vitamin B12 and Folate Lab Results  Component Value Date   VITAMINB12 417 11/17/2022   IMPRESSION AND PLAN:  #1 generalized anxiety disorder. Stable.  Reassured.  Encouraged her to use her lorazepam as needed.   #2 hypertension, well-controlled on losartan 100 mg a day. Electrolytes and creatinine monitoring today.  #3 chronic renal insufficiency stage III, history of renal transplant. Most recent serum creatinine was 1.0 on 12/29/22, GFR 58. Basic metabolic panel today.   #4 hypothyroidism. TSH was over suppressed earlier this year but was back into normal range 2 months ago.  Cont one half of 125 mcg tab on 2 days a week and a whole tab on all other days. Monitor TSH today.   #5 PAF, asymptomatic.  Regular rhythm today. Continue Cardizem and Eliquis and she has appointment with EP in the near future.  #6 itching.  No rash other than recently in the left ankle which I think is more related to scratching around a bruise.  Monitor. She will apply hydrocortisone ointment as needed.  #7 vitamin D deficiency. She is on one 50,000 unit vitamin D tab weekly.  She does miss an occasional dose. Check vitamin D level today.  An After Visit Summary was printed and  given to the patient.  FOLLOW UP: Return in about 4 months (around 09/11/2023) for annual CPE (fasting). Next CPE February 2025 Signed:  Santiago Bumpers, MD           05/11/2023

## 2023-05-12 ENCOUNTER — Telehealth: Payer: Self-pay

## 2023-05-12 MED ORDER — VITAMIN D (ERGOCALCIFEROL) 1.25 MG (50000 UNIT) PO CAPS: 50000.0000 [IU] | ORAL_CAPSULE | ORAL | 0 refills | Status: DC

## 2023-05-12 NOTE — Telephone Encounter (Addendum)
-----   Message from Jeoffrey Massed sent at 05/11/2023  7:40 PM EDT ----- Please notify patient that her kidney function is great and electrolytes are normal. Vitamin D is still low. Increase vitamin D tab to twice weekly. Send script for 50,000 unit vitamin D, one tab twice a week, 24, no refill. Thyroid level just a little bit low now. Take a whole 125 microgram levothyroxine tab daily on six days a week, half tab one day a week.   Pt advised of results/recommendations.

## 2023-05-15 DIAGNOSIS — H52203 Unspecified astigmatism, bilateral: Secondary | ICD-10-CM | POA: Diagnosis not present

## 2023-05-15 DIAGNOSIS — H353131 Nonexudative age-related macular degeneration, bilateral, early dry stage: Secondary | ICD-10-CM | POA: Diagnosis not present

## 2023-05-15 DIAGNOSIS — H26493 Other secondary cataract, bilateral: Secondary | ICD-10-CM | POA: Diagnosis not present

## 2023-05-15 DIAGNOSIS — H526 Other disorders of refraction: Secondary | ICD-10-CM | POA: Diagnosis not present

## 2023-05-15 DIAGNOSIS — H04123 Dry eye syndrome of bilateral lacrimal glands: Secondary | ICD-10-CM | POA: Diagnosis not present

## 2023-05-15 DIAGNOSIS — Z961 Presence of intraocular lens: Secondary | ICD-10-CM | POA: Diagnosis not present

## 2023-05-22 ENCOUNTER — Ambulatory Visit: Payer: Medicare Other | Admitting: Family Medicine

## 2023-05-22 ENCOUNTER — Encounter: Payer: Medicare Other | Admitting: Obstetrics & Gynecology

## 2023-05-22 DIAGNOSIS — N281 Cyst of kidney, acquired: Secondary | ICD-10-CM | POA: Diagnosis not present

## 2023-05-22 DIAGNOSIS — I10 Essential (primary) hypertension: Secondary | ICD-10-CM | POA: Diagnosis not present

## 2023-05-22 DIAGNOSIS — K625 Hemorrhage of anus and rectum: Secondary | ICD-10-CM | POA: Diagnosis not present

## 2023-05-22 DIAGNOSIS — R7989 Other specified abnormal findings of blood chemistry: Secondary | ICD-10-CM | POA: Diagnosis not present

## 2023-05-22 DIAGNOSIS — K8020 Calculus of gallbladder without cholecystitis without obstruction: Secondary | ICD-10-CM | POA: Diagnosis not present

## 2023-05-22 DIAGNOSIS — K5730 Diverticulosis of large intestine without perforation or abscess without bleeding: Secondary | ICD-10-CM | POA: Diagnosis not present

## 2023-05-22 DIAGNOSIS — I4891 Unspecified atrial fibrillation: Secondary | ICD-10-CM | POA: Diagnosis not present

## 2023-05-22 DIAGNOSIS — Z7901 Long term (current) use of anticoagulants: Secondary | ICD-10-CM | POA: Diagnosis not present

## 2023-05-23 ENCOUNTER — Encounter: Payer: Self-pay | Admitting: Internal Medicine

## 2023-05-23 ENCOUNTER — Ambulatory Visit (INDEPENDENT_AMBULATORY_CARE_PROVIDER_SITE_OTHER): Payer: Medicare Other | Admitting: Internal Medicine

## 2023-05-23 VITALS — BP 122/78 | HR 73 | Ht 65.0 in | Wt 243.0 lb

## 2023-05-23 DIAGNOSIS — K625 Hemorrhage of anus and rectum: Secondary | ICD-10-CM

## 2023-05-23 DIAGNOSIS — Z7901 Long term (current) use of anticoagulants: Secondary | ICD-10-CM | POA: Diagnosis not present

## 2023-05-23 DIAGNOSIS — D124 Benign neoplasm of descending colon: Secondary | ICD-10-CM | POA: Diagnosis not present

## 2023-05-23 DIAGNOSIS — I480 Paroxysmal atrial fibrillation: Secondary | ICD-10-CM

## 2023-05-23 DIAGNOSIS — K649 Unspecified hemorrhoids: Secondary | ICD-10-CM

## 2023-05-23 DIAGNOSIS — Z860100 Personal history of colon polyps, unspecified: Secondary | ICD-10-CM

## 2023-05-23 MED ORDER — HYDROCORTISONE ACETATE 25 MG RE SUPP: 25.0000 mg | Freq: Two times a day (BID) | RECTAL | 0 refills | Status: DC

## 2023-05-23 NOTE — Patient Instructions (Signed)
We have sent the following medications to your pharmacy for you to pick up at your convenience: Anusol -[facility] suppositories  Follow-up as needed.   _______________________________________________________  If your blood pressure at your visit was 140/90 or greater, please contact your primary care physician to follow up on this.  _______________________________________________________  If you are age 77 or older, your body mass index should be between 23-30. Your Body mass index is 40.44 kg/m. If this is out of the aforementioned range listed, please consider follow up with your Primary Care Provider.  If you are age 8 or younger, your body mass index should be between 19-25. Your Body mass index is 40.44 kg/m. If this is out of the aformentioned range listed, please consider follow up with your Primary Care Provider.   ________________________________________________________  The [facility] providers would like to encourage you to use [facility] to communicate with providers for non-urgent requests or questions.  Due to long hold times on the telephone, sending your provider a message by [facility] may be a faster and more efficient way to get a response.  Please allow 48 business hours for a response.  Please remember that this is for non-urgent requests.  _______________________________________________________  Thank you for choosing me and [facility].  Dr.John Marina Goodell

## 2023-05-23 NOTE — Progress Notes (Signed)
HISTORY OF PRESENT ILLNESS:  Tabitha Green is a 77 y.o. female with multiple medical problems as listed below including atrial fibrillation for which she is on Eliquis therapy.  Patient was evaluated here recently for rectal bleeding and Hemoccult positive stool for which she underwent complete colonoscopy March 10, 2023.  She was found to have a large pedunculated polyp in the left colon which was removed.  The pathology revealed tubular adenoma.  She is also noted to have significant pandiverticulosis and internal hemorrhoids.  I saw her back for follow-up after her colonoscopy April 25, 2023.  She was doing well.  Follow-up colonoscopy in 3 years recommended.  Patient was doing well until yesterday morning when after a normal bowel movement she noticed red blood in the toilet bowl.  Because she is on anticoagulation she presented to her local emergency room.  I have reviewed that evaluation.  Rectal exam revealed Hemoccult positive stool.  Her vital signs are stable.  Blood work was favorable with hemoglobin 13.0.  CT angiogram of the abdomen and pelvis was negative.  Severe diverticulosis noted.  She was told to hold her Eliquis and follow-up with her gastroenterologist.  This appointment made today.  She tells me that she has noticed some red blood and small clots with normal-appearing bowel movement since.  No other complaints.  Problem is lessening.  There is no associated abdominal or rectal pain.  REVIEW OF SYSTEMS:  All non-GI ROS negative except for  Past Medical History:  Diagnosis Date   Anemia    when I was on dyalysis   Anxiety    Arthritis    Atrial fibrillation (HCC)    Cataract    Cholelithiasis 04/2021   noted on CT abd/pelv done for R lower abd swelling and MR abd   Chronic renal insufficiency, stage III (moderate) (HCC) 10/2015   Post-transplant baseline sCr 1.2-1.4.     CMV infection [facility]) summer 2017   Valcyte per ID/Renal transplant team   Depression     Diverticulosis    a. 05/2012 colonoscopy   Erosive lichen planus of vulva    Topical steroids (managed by Dr. Robby [facility]on [facility] via [facility].   ESRD (end stage renal disease) (HCC)    began dialysis 2014--followed by Dr. Lowell Guitar.  Received deceased donor kidney transplant 10/2015.   Fatty liver 10/2021   with hepatomegaly   FSGS (focal segmental glomerulosclerosis)    right kidney; hx of left renal cell cancer and got nephrectomy 1993.   GERD (gastroesophageal reflux disease)    Gout    Heart murmur    (Diastolic) ECHO 08/2013 showed that this murmur is coming from pt's R arm AV fistula   Hiatal hernia    History of renal cell cancer 1993   Left nephrectomy   Hyperlipidemia    Hypertension    Hypothyroidism    Impingement syndrome of left shoulder 04/2016   Dr. Bertram Savin to PT   Inguinal hernia    right   Kidney cancer, primary, with metastasis from kidney to other site [facility])    Lumbar spinal stenosis    Chronic LBP with bilat neurogenic claudication   Metatarsal fracture, pathologic 01/2018   Right; orthocarolina-->post op shoe continued, f/u x-ray planned.   Obesity    Osteoarthritis of left knee 08/2017   severe, diffuse, tricompartmental.  Responded to steroid injection.     Osteoporosis 06/07/2016   DEXA T-score of -3.1.  Fosamax planned 2017 but dental work  prevented start..  Pathological toe fx 12/2017--repeat DEXA 08/2018, T-score -3.3 radius. 12/2020 T score radius -3.7.   Pneumonia    Renal transplant recipient 11-08-2015   Baseline Cr as of 09/2020 1.2-1.4   Simple hepatic cyst    2022   Solitary kidney, acquired    SVT (supraventricular tachycardia) (HCC)    UTI (urinary tract infection)    Ventral hernia    Vitamin B12 deficiency 12/2018   Starting replacement as of 12/11/2018    Past Surgical History:  Procedure Laterality Date   AV FISTULA PLACEMENT Right    Right arm: aneurismal dilatation 2017 being followed by CV  surgeons   CARDIOVASCULAR STRESS TEST     03/10/15 ETT ([facility]): Exercise ECG negative at 83% max predicted HR.    CATARACT EXTRACTION     COLONOSCOPY  2003; 05/2012   diverticulosis, no polyps.  Recall 10 yrs   colonoscopy with polypectomy     Dr Marina Goodell   DEXA  06/07/2016; 08/22/2018; 12/30/20   2017 T-score -3.1.  2020 T score -3.3.  2022 T score -3.7   HYSTERECTOMY ABDOMINAL WITH SALPINGECTOMY     KIDNEY TRANSPLANT  11-08-15   Deceased donor kidney transplant, with Thymoglobulin induction   LIGATION OF ARTERIOVENOUS  FISTULA Right 02/14/2017   Procedure: LIGATION OF ARTERIOVENOUS  FISTULA;  Surgeon: Chuck Hint, MD;  Location: [facility] OR;  Service: Vascular;  Laterality: Right;   NEPHRECTOMY  1993   for malignancy- left   RENAL BIOPSY     right   RESECTION OF ARTERIOVENOUS FISTULA ANEURYSM Right 02/14/2017   Procedure: EXCISION OF RIGHT BRACHIOCEPHALIC ARTERIOVENOUS FISTULA ANEURYSM;  Surgeon: Chuck Hint, MD;  Location: [facility] OR;  Service: Vascular;  Laterality: Right;   TEE WITHOUT CARDIOVERSION N/A 08/27/2013   Procedure: TRANSESOPHAGEAL ECHOCARDIOGRAM (TEE);  Surgeon: Wendall Stade, MD;  Location: [facility] ENDOSCOPY;  Service: Cardiovascular;  Laterality: N/A;   TOTAL ABDOMINAL HYSTERECTOMY W/ BILATERAL SALPINGOOPHORECTOMY  1997   fibroids   TRANSTHORACIC ECHOCARDIOGRAM     03/10/15 echo ([facility]): LV cavity normal in size, focal basal hypertrophy, normal systolic function, EF 55% (visual est). Normal wall motion, no regional wall motion abnormalities. Mild diastolic dysfunction with normal LA chamber size. No significant valve stenosis or regurgitation.  12/2022 normal   VULVA / PERINEUM BIOPSY  2015    Social History LIBERTA GIMPEL  reports that she has never smoked. She has never used smokeless tobacco. She reports current alcohol use. She reports that she does not use drugs.  family history includes Cancer in her paternal aunt and sister;  Deep vein thrombosis in her mother; Diabetes in her maternal grandfather; Fibroids in her daughter; Heart attack (age of onset: 11) in her father; Hypertension in her father and mother; Stroke in her paternal grandmother.  Allergies  Allergen Reactions   Labetalol     Patient couldn't walk, low BP   Ceclor [Cefaclor] Rash   Naproxen Rash   Enalapril Maleate Cough    Vasotec   Metoprolol Tartrate Rash    On legs       PHYSICAL EXAMINATION: Vital signs: BP 122/78   Pulse 73   Ht 5\' 5"  (1.651 m)   Wt 243 lb (110.2 kg)   BMI 40.44 kg/m   Constitutional: generally well-appearing, no acute distress Psychiatric: alert and oriented x3, cooperative Eyes: extraocular movements intact, anicteric, conjunctiva pink Mouth: oral pharynx moist, no lesions Neck: supple no lymphadenopathy Cardiovascular: heart regular rate and  rhythm, no murmur Lungs: clear to auscultation bilaterally Abdomen: soft, nontender, nondistended, no obvious ascites, no peritoneal signs, normal bowel sounds, no organomegaly Rectal: Omitted Extremities: no lower extremity edema bilaterally Skin: no lesions on visible extremities Neuro: No focal deficits.  Cranial nerves intact ASSESSMENT:  1.  Rectal bleeding as described.  Most likely hemorrhoidal. 2.  Recent colonoscopy with large polyp removed. 3.  Pandiverticulosis 4.  A-fib with chronic anticoagulation   PLAN:  1.  Prescribe Anusol HC suppositories.  1 PR nightly for 1 week 2.  Resume Eliquis after rectal bleeding stopped.  If rectal bleeding persists for more than 1 week, she should reach out to this office. 3.  Resume general medical care with other providers 4.  Surveillance colonoscopy 3 years Total time of 30 minutes was spent preparing to see the patient, reviewing outside records, obtaining interval history, performing medically appropriate physical exam, counseling and educating the patient regarding the above listed issues, and documenting  clinical information in the health record

## 2023-05-30 ENCOUNTER — Telehealth: Payer: Self-pay | Admitting: Internal Medicine

## 2023-05-30 MED ORDER — HYDROCORTISONE (PERIANAL) 2.5 % EX CREA: 1.0000 | TOPICAL_CREAM | Freq: Two times a day (BID) | CUTANEOUS | 1 refills | Status: DC

## 2023-05-30 NOTE — Telephone Encounter (Signed)
Linda, 1.  Stay on Eliquis 2.  Minor bleeding is okay 3.  Please prescribe the compounded form of Anusol HC.  I believe this is hydrocortisone and a glycerin suppository.  This is much cheaper.  She should insert 1 per rectum at night for 4 weeks.  Multiple refills.  Can use on demand.  Check with [facility] if you are not sure how to order and where. 4.  Take Citrucel 2 tablespoons daily Thanks, Dr. Marina Goodell

## 2023-05-30 NOTE — Telephone Encounter (Signed)
Inbound call from patient stating that Dr. Marina Goodell took her off Eliquis to see if her bleeding would stop. Patient stated it had stopped but it had started again. Patient is requesting a call to discuss next steps. Please advise.

## 2023-05-30 NOTE — Telephone Encounter (Signed)
Pt states Dr. Marina Goodell had her hold her eliquis and told her to resume it when the bleeding stopped. She reports she started the eliquis back on Friday and now she is seeing just a little blood on the tissue when she wipes, a little in the toilet water. Pt was unable to afford the Anusol HC supp. She wants to know if there is anything else she needs to be doing, she is nervous to be off of the eliquis long as she takes it for A fib. Please advise.

## 2023-05-30 NOTE — Telephone Encounter (Signed)
Left message for pt to call back.  Pt aware of recommendations. Anusol cream sent to pharmacy. Pt will put the cream on a prep h supp and use those in place of anusol supp.

## 2023-06-01 ENCOUNTER — Encounter (HOSPITAL_COMMUNITY): Payer: Self-pay

## 2023-06-01 ENCOUNTER — Ambulatory Visit: Payer: Medicare Other

## 2023-06-01 ENCOUNTER — Other Ambulatory Visit (HOSPITAL_COMMUNITY): Payer: Self-pay

## 2023-06-01 DIAGNOSIS — Z1231 Encounter for screening mammogram for malignant neoplasm of breast: Secondary | ICD-10-CM

## 2023-06-02 ENCOUNTER — Other Ambulatory Visit: Payer: Self-pay

## 2023-06-02 ENCOUNTER — Observation Stay (HOSPITAL_COMMUNITY)
Admission: EM | Admit: 2023-06-02 | Discharge: 2023-06-08 | Disposition: A | Discharge status: Home / Self Care | Payer: Medicare Other | Attending: Internal Medicine | Admitting: Internal Medicine

## 2023-06-02 ENCOUNTER — Encounter (HOSPITAL_COMMUNITY): Payer: Self-pay

## 2023-06-02 DIAGNOSIS — Z79899 Other long term (current) drug therapy: Secondary | ICD-10-CM | POA: Diagnosis not present

## 2023-06-02 DIAGNOSIS — R58 Hemorrhage, not elsewhere classified: Secondary | ICD-10-CM | POA: Diagnosis not present

## 2023-06-02 DIAGNOSIS — K625 Hemorrhage of anus and rectum: Secondary | ICD-10-CM | POA: Diagnosis not present

## 2023-06-02 DIAGNOSIS — Z992 Dependence on renal dialysis: Secondary | ICD-10-CM | POA: Insufficient documentation

## 2023-06-02 DIAGNOSIS — R6889 Other general symptoms and signs: Secondary | ICD-10-CM | POA: Diagnosis not present

## 2023-06-02 DIAGNOSIS — I120 Hypertensive chronic kidney disease with stage 5 chronic kidney disease or end stage renal disease: Secondary | ICD-10-CM | POA: Diagnosis not present

## 2023-06-02 DIAGNOSIS — Z7901 Long term (current) use of anticoagulants: Secondary | ICD-10-CM | POA: Insufficient documentation

## 2023-06-02 DIAGNOSIS — R197 Diarrhea, unspecified: Secondary | ICD-10-CM | POA: Diagnosis not present

## 2023-06-02 DIAGNOSIS — Z940 Kidney transplant status: Secondary | ICD-10-CM | POA: Insufficient documentation

## 2023-06-02 DIAGNOSIS — E538 Deficiency of other specified B group vitamins: Secondary | ICD-10-CM | POA: Diagnosis not present

## 2023-06-02 DIAGNOSIS — K5731 Diverticulosis of large intestine without perforation or abscess with bleeding: Secondary | ICD-10-CM

## 2023-06-02 DIAGNOSIS — E039 Hypothyroidism, unspecified: Secondary | ICD-10-CM | POA: Diagnosis not present

## 2023-06-02 DIAGNOSIS — D62 Acute posthemorrhagic anemia: Secondary | ICD-10-CM | POA: Diagnosis not present

## 2023-06-02 DIAGNOSIS — I10 Essential (primary) hypertension: Secondary | ICD-10-CM | POA: Diagnosis present

## 2023-06-02 DIAGNOSIS — Z85528 Personal history of other malignant neoplasm of kidney: Secondary | ICD-10-CM | POA: Insufficient documentation

## 2023-06-02 DIAGNOSIS — I480 Paroxysmal atrial fibrillation: Secondary | ICD-10-CM | POA: Diagnosis not present

## 2023-06-02 DIAGNOSIS — R110 Nausea: Secondary | ICD-10-CM | POA: Diagnosis not present

## 2023-06-02 DIAGNOSIS — N186 End stage renal disease: Secondary | ICD-10-CM | POA: Insufficient documentation

## 2023-06-02 DIAGNOSIS — K922 Gastrointestinal hemorrhage, unspecified: Secondary | ICD-10-CM | POA: Diagnosis present

## 2023-06-02 DIAGNOSIS — Z743 Need for continuous supervision: Secondary | ICD-10-CM | POA: Diagnosis not present

## 2023-06-02 LAB — COMPREHENSIVE METABOLIC PANEL
ALT: 14 U/L (ref 0–44)
AST: 18 U/L (ref 15–41)
Albumin: 3.2 g/dL — ABNORMAL LOW (ref 3.5–5.0)
Alkaline Phosphatase: 66 U/L (ref 38–126)
Anion gap: 9 (ref 5–15)
BUN: 17 mg/dL (ref 8–23)
CO2: 22 mmol/L (ref 22–32)
Calcium: 8.8 mg/dL — ABNORMAL LOW (ref 8.9–10.3)
Chloride: 108 mmol/L (ref 98–111)
Creatinine, Ser: 0.93 mg/dL (ref 0.44–1.00)
GFR, Estimated: >60 mL/min (ref >60)
Glucose, Bld: 127 mg/dL — ABNORMAL HIGH (ref 70–99)
Potassium: 3.9 mmol/L (ref 3.5–5.1)
Sodium: 139 mmol/L (ref 135–145)
Total Bilirubin: 0.8 mg/dL (ref 0.3–1.2)
Total Protein: 6.2 g/dL — ABNORMAL LOW (ref 6.5–8.1)

## 2023-06-02 LAB — CBC WITH DIFFERENTIAL/PLATELET
Abs Immature Granulocytes: 0.03 10*3/uL (ref 0.00–0.07)
Basophils Absolute: 0 10*3/uL (ref 0.0–0.1)
Basophils Relative: 0 %
Eosinophils Absolute: 0.2 10*3/uL (ref 0.0–0.5)
Eosinophils Relative: 3 %
HCT: 32.8 % — ABNORMAL LOW (ref 36.0–46.0)
Hemoglobin: 10.4 g/dL — ABNORMAL LOW (ref 12.0–15.0)
Immature Granulocytes: 0 %
Lymphocytes Relative: 23 %
Lymphs Abs: 1.8 10*3/uL (ref 0.7–4.0)
MCH: 29.4 pg (ref 26.0–34.0)
MCHC: 31.7 g/dL (ref 30.0–36.0)
MCV: 92.7 fL (ref 80.0–100.0)
Monocytes Absolute: 0.4 10*3/uL (ref 0.1–1.0)
Monocytes Relative: 5 %
Neutro Abs: 5.4 10*3/uL (ref 1.7–7.7)
Neutrophils Relative %: 69 %
Platelets: 253 10*3/uL (ref 150–400)
RBC: 3.54 MIL/uL — ABNORMAL LOW (ref 3.87–5.11)
RDW: 13.5 % (ref 11.5–15.5)
WBC: 7.8 10*3/uL (ref 4.0–10.5)
nRBC: 0 % (ref 0.0–0.2)

## 2023-06-02 LAB — TYPE AND SCREEN
ABO/RH(D): A POS
Antibody Screen: NEGATIVE

## 2023-06-02 LAB — HEMOGLOBIN AND HEMATOCRIT, BLOOD
HCT: 28.6 % — ABNORMAL LOW (ref 36.0–46.0)
HCT: 28.8 % — ABNORMAL LOW (ref 36.0–46.0)
Hemoglobin: 9.1 g/dL — ABNORMAL LOW (ref 12.0–15.0)
Hemoglobin: 9 g/dL — ABNORMAL LOW (ref 12.0–15.0)

## 2023-06-02 LAB — ABO/RH: ABO/RH(D): A POS

## 2023-06-02 MED ORDER — LORAZEPAM 0.5 MG PO TABS: 0.5000 mg | ORAL_TABLET | Freq: Two times a day (BID) | ORAL | Status: DC | PRN

## 2023-06-02 MED ORDER — MYCOPHENOLATE SODIUM 180 MG PO TBEC
360.0000 mg | DELAYED_RELEASE_TABLET | Freq: Two times a day (BID) | ORAL | Status: DC
  Administered 2023-06-02 – 2023-06-08 (×12): 360 mg via ORAL
  Filled 2023-06-02 (×12): qty 2

## 2023-06-02 MED ORDER — VERAPAMIL HCL ER 240 MG PO TBCR
240.0000 mg | EXTENDED_RELEASE_TABLET | Freq: Every morning | ORAL | Status: DC
Start: 2023-06-03 — End: 2023-06-08
  Administered 2023-06-03 – 2023-06-08 (×6): 240 mg via ORAL
  Filled 2023-06-02 (×6): qty 1

## 2023-06-02 MED ORDER — HYDROCORTISONE (PERIANAL) 2.5 % EX CREA: 1.0000 | TOPICAL_CREAM | Freq: Two times a day (BID) | CUTANEOUS | Status: DC

## 2023-06-02 MED ORDER — HYDROCORTISONE ACETATE 25 MG RE SUPP
25.0000 mg | Freq: Two times a day (BID) | RECTAL | Status: AC
  Administered 2023-06-02 – 2023-06-03 (×4): 25 mg via RECTAL
  Filled 2023-06-02 (×4): qty 1

## 2023-06-02 MED ORDER — DOXAZOSIN MESYLATE 2 MG PO TABS
2.0000 mg | ORAL_TABLET | Freq: Two times a day (BID) | ORAL | Status: DC
  Administered 2023-06-02 – 2023-06-08 (×12): 2 mg via ORAL
  Filled 2023-06-02 (×15): qty 1

## 2023-06-02 MED ORDER — HYDRALAZINE HCL 20 MG/ML IJ SOLN: 5.0000 mg | Freq: Four times a day (QID) | INTRAMUSCULAR | Status: DC | PRN

## 2023-06-02 MED ORDER — ONDANSETRON HCL 4 MG PO TABS: 4.0000 mg | ORAL_TABLET | Freq: Four times a day (QID) | ORAL | Status: DC | PRN

## 2023-06-02 MED ORDER — LOSARTAN POTASSIUM 50 MG PO TABS
100.0000 mg | ORAL_TABLET | Freq: Every day | ORAL | Status: DC
  Administered 2023-06-03 – 2023-06-08 (×6): 100 mg via ORAL
  Filled 2023-06-02 (×6): qty 2

## 2023-06-02 MED ORDER — TACROLIMUS 1 MG PO CAPS
2.0000 mg | ORAL_CAPSULE | Freq: Two times a day (BID) | ORAL | Status: DC
  Administered 2023-06-02 – 2023-06-08 (×12): 2 mg via ORAL
  Filled 2023-06-02 (×12): qty 2

## 2023-06-02 MED ORDER — MAGNESIUM CHLORIDE 64 MG PO TBEC
2.0000 | DELAYED_RELEASE_TABLET | Freq: Every day | ORAL | Status: DC
  Administered 2023-06-02 – 2023-06-08 (×7): 128 mg via ORAL
  Filled 2023-06-02 (×7): qty 2

## 2023-06-02 MED ORDER — ACETAMINOPHEN 325 MG PO TABS
650.0000 mg | ORAL_TABLET | Freq: Four times a day (QID) | ORAL | Status: DC | PRN
  Administered 2023-06-04 – 2023-06-07 (×4): 650 mg via ORAL
  Filled 2023-06-02 (×4): qty 2

## 2023-06-02 MED ORDER — SODIUM BICARBONATE 650 MG PO TABS
650.0000 mg | ORAL_TABLET | Freq: Two times a day (BID) | ORAL | Status: DC
  Administered 2023-06-02 – 2023-06-08 (×13): 650 mg via ORAL
  Filled 2023-06-02 (×15): qty 1

## 2023-06-02 MED ORDER — ACETAMINOPHEN 650 MG RE SUPP: 650.0000 mg | Freq: Four times a day (QID) | RECTAL | Status: DC | PRN

## 2023-06-02 MED ORDER — LEVOTHYROXINE SODIUM 25 MCG PO TABS
125.0000 ug | ORAL_TABLET | ORAL | Status: DC
  Administered 2023-06-03 – 2023-06-08 (×5): 125 ug via ORAL
  Filled 2023-06-02 (×5): qty 1

## 2023-06-02 MED ORDER — ONDANSETRON HCL 4 MG/2ML IJ SOLN: 4.0000 mg | Freq: Four times a day (QID) | INTRAMUSCULAR | Status: DC | PRN

## 2023-06-02 NOTE — H&P (Addendum)
History and Physical  Tabitha Green ZDG:[id number] DOB: [birth year] DOA: 06/02/2023  PCP: Jeoffrey Massed, MD   Chief Complaint: Rectal bleeding  HPI: Tabitha Green is a 77 y.o. female with medical history significant for relation diagnosed April 2024 on Eliquis 5 mg p.o. twice daily, known diverticulosis and internal hemorrhoids as well as GERD, FSGS status post kidney transplant being admitted to the hospital with acute blood loss anemia and bright red blood per rectum.  The history of some rectal bleeding in August of this year, she had colonoscopy with evidence of diverticula and internal hemorrhoids.  Developed some hematochezia on 10/14, since that time initially held her Eliquis had an office visit with Dr. Marina Goodell and he recommended that he hold her Eliquis until the bleeding stopped.  She restarted her Eliquis on 10/20 but over the last 2 to 3 days she has had recurrence of intermittent bright red blood per rectum mixed with maroon stools.  Today she had a large loose bowel movement that was maroon, felt dizzy and lightheaded when she was ambulating and so came to the ER for evaluation.  Denies any significant abdominal pain, rectal pain, or nausea.  No chest pain, palpitations, fevers, chills or vomiting.  In the emergency department vital signs are unremarkable.  Hemoglobin of 10.4, down from 13 at an outside facility about 10 days ago.  Hospitalist was contacted for observation admission, and patient has been seen by gastroenterology in the meantime.  Review of Systems: Please see HPI for pertinent positives and negatives. A complete 10 system review of systems are otherwise negative.  Past Medical History:  Diagnosis Date   Anemia    when I was on dyalysis   Anxiety    Arthritis    Atrial fibrillation (HCC)    Cataract    Cholelithiasis 04/2021   noted on CT abd/pelv done for R lower abd swelling and MR abd   Chronic renal insufficiency, stage III (moderate) (HCC) 10/2015    Post-transplant baseline sCr 1.2-1.4.     CMV infection [facility]) summer 2017   Valcyte per ID/Renal transplant team   Depression    Diverticulosis    a. 05/2012 colonoscopy   Erosive lichen planus of vulva    Topical steroids (managed by Dr. Robby Sermon [facility] via [facility] out[facility].   ESRD (end stage renal disease) (HCC)    began dialysis 2014--followed by Dr. Lowell Guitar.  Received deceased donor kidney transplant 10/2015.   Fatty liver 10/2021   with hepatomegaly   FSGS (focal segmental glomerulosclerosis)    right kidney; hx of left renal cell cancer and got nephrectomy 1993.   GERD (gastroesophageal reflux disease)    Gout    Heart murmur    (Diastolic) ECHO 08/2013 showed that this murmur is coming from pt's R arm AV fistula   Hiatal hernia    History of renal cell cancer 1993   Left nephrectomy   Hyperlipidemia    Hypertension    Hypothyroidism    Impingement syndrome of left shoulder 04/2016   Dr. Bertram Savin to PT   Inguinal hernia    right   Kidney cancer, primary, with metastasis from kidney to other site [facility])    Lumbar spinal stenosis    Chronic LBP with bilat neurogenic claudication   Metatarsal fracture, pathologic 01/2018   Right; orthocarolina-->post op shoe continued, f/u x-ray planned.   Obesity    Osteoarthritis of left knee 08/2017   severe, diffuse, tricompartmental.  Responded to  steroid injection.     Osteoporosis 06/07/2016   DEXA T-score of -3.1.  Fosamax planned 2017 but dental work prevented start..  Pathological toe fx 12/2017--repeat DEXA 08/2018, T-score -3.3 radius. 12/2020 T score radius -3.7.   Pneumonia    Renal transplant recipient 11-26-15   Baseline Cr as of 09/2020 1.2-1.4   Simple hepatic cyst    2022   Solitary kidney, acquired    SVT (supraventricular tachycardia) (HCC)    UTI (urinary tract infection)    Ventral hernia    Vitamin B12 deficiency 12/2018   Starting replacement as of 12/11/2018   Past Surgical  History:  Procedure Laterality Date   AV FISTULA PLACEMENT Right    Right arm: aneurismal dilatation 2017 being followed by CV surgeons   CARDIOVASCULAR STRESS TEST     03/10/15 ETT ([facility]): Exercise ECG negative at 83% max predicted HR.    CATARACT EXTRACTION     COLONOSCOPY  2003; 05/2012   diverticulosis, no polyps.  Recall 10 yrs   colonoscopy with polypectomy     Dr Marina Goodell   DEXA  06/07/2016; 08/22/2018; 12/30/20   2017 T-score -3.1.  2020 T score -3.3.  2022 T score -3.7   HYSTERECTOMY ABDOMINAL WITH SALPINGECTOMY     KIDNEY TRANSPLANT  11/26/2015   Deceased donor kidney transplant, with Thymoglobulin induction   LIGATION OF ARTERIOVENOUS  FISTULA Right 02/14/2017   Procedure: LIGATION OF ARTERIOVENOUS  FISTULA;  Surgeon: Chuck Hint, MD;  Location: [facility] OR;  Service: Vascular;  Laterality: Right;   NEPHRECTOMY  1993   for malignancy- left   RENAL BIOPSY     right   RESECTION OF ARTERIOVENOUS FISTULA ANEURYSM Right 02/14/2017   Procedure: EXCISION OF RIGHT BRACHIOCEPHALIC ARTERIOVENOUS FISTULA ANEURYSM;  Surgeon: Chuck Hint, MD;  Location: [facility] OR;  Service: Vascular;  Laterality: Right;   TEE WITHOUT CARDIOVERSION N/A 08/27/2013   Procedure: TRANSESOPHAGEAL ECHOCARDIOGRAM (TEE);  Surgeon: Wendall Stade, MD;  Location: [facility] ENDOSCOPY;  Service: Cardiovascular;  Laterality: N/A;   TOTAL ABDOMINAL HYSTERECTOMY W/ BILATERAL SALPINGOOPHORECTOMY  1997   fibroids   TRANSTHORACIC ECHOCARDIOGRAM     03/10/15 echo ([facility]): LV cavity normal in size, focal basal hypertrophy, normal systolic function, EF 55% (visual est). Normal wall motion, no regional wall motion abnormalities. Mild diastolic dysfunction with normal LA chamber size. No significant valve stenosis or regurgitation.  12/2022 normal   VULVA / PERINEUM BIOPSY  2015    Social History:  reports that she has never smoked. She has never used smokeless tobacco. She reports current alcohol  use. She reports that she does not use drugs.   Allergies  Allergen Reactions   Labetalol     Patient couldn't walk, low BP   Ceclor [Cefaclor] Rash   Naproxen Rash   Enalapril Maleate Cough    Vasotec   Metoprolol Tartrate Rash    On legs    Family History  Problem Relation Age of Onset   Deep vein thrombosis Mother        post thyroid surgery   Hypertension Mother    Heart attack Father 105       deceased   Hypertension Father    Cancer Sister        breast   Cancer Paternal Aunt        pancreatic   Diabetes Maternal Grandfather    Stroke Paternal Grandmother        in 35s   Fibroids Daughter  Colon cancer Neg Hx    Esophageal cancer Neg Hx    Stomach cancer Neg Hx    Rectal cancer Neg Hx    Sleep apnea Neg Hx      Prior to Admission medications   Medication Sig Start Date End Date Taking? Authorizing Provider  acetaminophen (TYLENOL) 500 MG tablet Take 1,000 mg by mouth every 8 (eight) hours as needed for moderate pain.    [provider]  apixaban (ELIQUIS) 5 MG TABS tablet Take 1 tablet (5 mg total) by mouth 2 (two) times daily. 12/13/22   McGowen, Maryjean Morn, MD  AYR SALINE NASAL DROPS NA Place 2 sprays into both nostrils daily.    [provider]  Camphor-Eucalyptus-Menthol (VICKS VAPORUB EX) Apply 1 application topically daily.    [provider]  Cyanocobalamin (B-12 PO) Take 1 tablet by mouth daily.    [provider]  doxazosin (CARDURA) 2 MG tablet TAKE 1 TABLET(2 MG) BY MOUTH TWICE DAILY 02/16/23   McGowen, Maryjean Morn, MD  hydrocortisone (ANUSOL-HC) 2.5 % rectal cream Place 1 Application rectally 2 (two) times daily. 01/23/23   Doree Albee, PA-C  hydrocortisone (ANUSOL-HC) 2.5 % rectal cream Place 1 Application rectally 2 (two) times daily. 05/30/23   Hilarie Fredrickson, MD  hydrocortisone (ANUSOL-HC) 25 MG suppository Place 1 suppository (25 mg total) rectally every 12 (twelve) hours. 05/23/23   Hilarie Fredrickson, MD   levothyroxine (SYNTHROID) 125 MCG tablet 1/2 tab po on two days a week, whole tab all other days Patient taking differently: Take 62.5-125 mcg by mouth See admin instructions. 1/2 tab po on two days a week Wed and Sat, and whole tab all other days 11/28/22   McGowen, Maryjean Morn, MD  LORazepam (ATIVAN) 0.5 MG tablet Take 1 tablet (0.5 mg total) by mouth 2 (two) times daily as needed for anxiety. 08/12/22   McGowen, Maryjean Morn, MD  losartan (COZAAR) 100 MG tablet TAKE 1 TABLET BY MOUTH DAILY 05/08/23   McGowen, Maryjean Morn, MD  magnesium chloride (SLOW-MAG) 64 MG TBEC SR tablet Take 2 tablets (128 mg total) by mouth daily. 05/11/23   McGowen, Maryjean Morn, MD  mycophenolate (MYFORTIC) 360 MG TBEC EC tablet Take 360 mg by mouth 2 (two) times daily.    [provider]  nystatin powder Apply 1 Application topically once a week.    [provider]  silver sulfADIAZINE (SILVADENE) 1 % cream Apply 1 Application topically daily.    [provider]  sodium bicarbonate 650 MG tablet Take 1 tablet (650 mg total) by mouth 2 (two) times daily. Takes 1 in AM and 1 at bedtime 08/25/22   McGowen, Maryjean Morn, MD  tacrolimus (PROGRAF) 1 MG capsule Take 2 mg by mouth 2 (two) times daily.    [provider]  triamcinolone cream (KENALOG) 0.1 % Apply 1 application topically daily.    [provider]  verapamil (CALAN-SR) 120 MG CR tablet TAKE 1 TABLET BY MOUTH EVERY  AFTERNOON 04/11/23   McGowen, Maryjean Morn, MD  verapamil (CALAN-SR) 240 MG CR tablet TAKE 1 TABLET BY MOUTH EVERY  MORNING 05/08/23   McGowen, Maryjean Morn, MD  Vitamin D, Ergocalciferol, (DRISDOL) 1.25 MG (50000 UNIT) CAPS capsule Take 1 capsule (50,000 Units total) by mouth 2 (two) times a week. 05/15/23   Jeoffrey Massed, MD    Physical Exam: BP 126/67   Pulse 76   Temp 97.6 F (36.4 C) (Oral)   Resp (!) 24  SpO2 94%   General:  Alert, oriented, calm, in no acute distress, looks very comfortable, her husband is at the  bedside Eyes: EOMI, clear conjuctivae, white sclerea Neck: supple, no masses, trachea mildline  Cardiovascular: RRR, no murmurs or rubs, no peripheral edema  Respiratory: clear to auscultation bilaterally, no wheezes, no crackles  Abdomen: soft, nontender, nondistended, normal bowel tones heard  Skin: dry, no rashes  Musculoskeletal: no joint effusions, normal range of motion  Psychiatric: appropriate affect, normal speech  Neurologic: extraocular muscles intact, clear speech, moving all extremities with intact sensorium         Labs on Admission:  Basic Metabolic Panel: Recent Labs  Lab 06/02/23 0920  NA 139  K 3.9  CL 108  CO2 22  GLUCOSE 127*  BUN 17  CREATININE 0.93  CALCIUM 8.8*   Liver Function Tests: Recent Labs  Lab 06/02/23 0920  AST 18  ALT 14  ALKPHOS 66  BILITOT 0.8  PROT 6.2*  ALBUMIN 3.2*   No results for input(s): "LIPASE", "AMYLASE" in the last 168 hours. No results for input(s): "AMMONIA" in the last 168 hours. CBC: Recent Labs  Lab 06/02/23 0920  WBC 7.8  NEUTROABS 5.4  HGB 10.4*  HCT 32.8*  MCV 92.7  PLT 253   Cardiac Enzymes: No results for input(s): "CKTOTAL", "CKMB", "CKMBINDEX", "TROPONINI" in the last 168 hours.  BNP (last 3 results) Recent Labs    11/20/22 1317  BNP 217.0*    ProBNP (last 3 results) No results for input(s): "PROBNP" in the last 8760 hours.  CBG: No results for input(s): "GLUCAP" in the last 168 hours.  Radiological Exams on Admission: No results found.  Assessment/Plan Tabitha Green is a 77 y.o. female with medical history significant for relation diagnosed April 2024 on Eliquis 5 mg p.o. twice daily, known diverticulosis and internal hemorrhoids as well as GERD, FSGS status post kidney transplant being admitted to the hospital with acute blood loss anemia and bright red blood per rectum.  Bright red blood per rectum-likely diverticular bleed versus internal hemorrhoids, versus other.  In the setting of  Eliquis for history of atrial fibrillation.  Patient is hemodynamically stable without evidence of active bleeding currently. -Observation admission to progressive -Continuous telemetry -Avoid blood thinners -Trend hemoglobin every 8 hours -Stat CTA bleeding study in case of copious bleeding -Transfuse as needed to keep hemoglobin greater than 7 -Clear liquid diet today -Appreciate GI involvement  Acute blood loss anemia-as above  Atrial fibrillation-currently in sinus rhythm, hold Eliquis in the setting of bright red blood per rectum with blood loss anemia.  Patient would benefit from outpatient cardiology follow-up and alternative therapy, due to inability to tolerate anticoagulation.  Hypertension-continue home verapamil, with as needed IV hydralazine  History of FSGS and renal transplant-continue Myfortic, Prograf and oral bicarb, renal function is at baseline  Hypothyroidism-Synthroid  Anxiety-lorazepam 0.5 mg p.o. twice daily as needed  DVT prophylaxis: SCDs only    Code Status: Full Code  Consults called: Gastroenterology  Admission status: Observation  Time spent: 49 minutes  Ilea Hilton Sharlette Dense MD [facility] Pager [phone]  If 7PM-7AM, please contact night-coverage [email or web address] Password [facility]  06/02/2023, 11:54 AM

## 2023-06-02 NOTE — ED Provider Notes (Signed)
[place] EMERGENCY DEPARTMENT AT [facility] Provider Note   CSN: [id number] Arrival date & time: 06/02/23  9528     History  Chief Complaint  Patient presents with   GI Problem    Tabitha Green is a 77 y.o. female.  HPI 77 year old female with a history of A-fib on Eliquis as well as a history of GI bleeding presents with rectal bleeding.  She saw her GI doctor, Dr. Marina Goodell, about a week and a half ago and her Eliquis was stopped for a couple days.  She went back on it 1 week ago.  Over the last 2-3 days she has been having recurrent rectal bleeding which is bright red but also maroon-colored.  Previous to this she always had a little bit of blood with wiping but now its back in the stool in the toilet.  This morning she had a couple episodes that were more from a bleeding perspective and she was feeling lightheaded.  She denies any abdominal pain but she does have some nausea.  Patient was unable to get the suppository prescribed by GI due to expense.  There was another option also prescribed but patient has not yet picked this up either.  Home Medications Prior to Admission medications   Medication Sig Start Date End Date Taking? Authorizing Provider  acetaminophen (TYLENOL) 500 MG tablet Take 1,000 mg by mouth every 8 (eight) hours as needed for moderate pain.    [provider]  apixaban (ELIQUIS) 5 MG TABS tablet Take 1 tablet (5 mg total) by mouth 2 (two) times daily. 12/13/22   McGowen, Maryjean Morn, MD  AYR SALINE NASAL DROPS NA Place 2 sprays into both nostrils daily.    [provider]  Camphor-Eucalyptus-Menthol (VICKS VAPORUB EX) Apply 1 application topically daily.    [provider]  Cyanocobalamin (B-12 PO) Take 1 tablet by mouth daily.    [provider]  doxazosin (CARDURA) 2 MG tablet TAKE 1 TABLET(2 MG) BY MOUTH TWICE DAILY 02/16/23   McGowen, Maryjean Morn, MD  hydrocortisone (ANUSOL-HC) 2.5 % rectal cream Place 1 Application  rectally 2 (two) times daily. 01/23/23   Doree Albee, PA-C  hydrocortisone (ANUSOL-HC) 2.5 % rectal cream Place 1 Application rectally 2 (two) times daily. 05/30/23   Hilarie Fredrickson, MD  hydrocortisone (ANUSOL-HC) 25 MG suppository Place 1 suppository (25 mg total) rectally every 12 (twelve) hours. 05/23/23   Hilarie Fredrickson, MD  levothyroxine (SYNTHROID) 125 MCG tablet 1/2 tab po on two days a week, whole tab all other days Patient taking differently: Take 62.5-125 mcg by mouth See admin instructions. 1/2 tab po on two days a week Wed and Sat, and whole tab all other days 11/28/22   McGowen, Maryjean Morn, MD  LORazepam (ATIVAN) 0.5 MG tablet Take 1 tablet (0.5 mg total) by mouth 2 (two) times daily as needed for anxiety. 08/12/22   McGowen, Maryjean Morn, MD  losartan (COZAAR) 100 MG tablet TAKE 1 TABLET BY MOUTH DAILY 05/08/23   McGowen, Maryjean Morn, MD  magnesium chloride (SLOW-MAG) 64 MG TBEC SR tablet Take 2 tablets (128 mg total) by mouth daily. 05/11/23   McGowen, Maryjean Morn, MD  mycophenolate (MYFORTIC) 360 MG TBEC EC tablet Take 360 mg by mouth 2 (two) times daily.    [provider]  nystatin powder Apply 1 Application topically once a week.    [provider]  silver sulfADIAZINE (SILVADENE) 1 % cream Apply 1 Application topically daily.  [provider]  sodium bicarbonate 650 MG tablet Take 1 tablet (650 mg total) by mouth 2 (two) times daily. Takes 1 in AM and 1 at bedtime 08/25/22   McGowen, Maryjean Morn, MD  tacrolimus (PROGRAF) 1 MG capsule Take 2 mg by mouth 2 (two) times daily.    [provider]  triamcinolone cream (KENALOG) 0.1 % Apply 1 application topically daily.    [provider]  verapamil (CALAN-SR) 120 MG CR tablet TAKE 1 TABLET BY MOUTH EVERY  AFTERNOON 04/11/23   McGowen, Maryjean Morn, MD  verapamil (CALAN-SR) 240 MG CR tablet TAKE 1 TABLET BY MOUTH EVERY  MORNING 05/08/23   McGowen, Maryjean Morn, MD  Vitamin D, Ergocalciferol, (DRISDOL) 1.25 MG (50000  UNIT) CAPS capsule Take 1 capsule (50,000 Units total) by mouth 2 (two) times a week. 05/15/23   McGowen, Maryjean Morn, MD      Allergies    Labetalol, Ceclor [cefaclor], Naproxen, Enalapril maleate, and Metoprolol tartrate    Review of Systems   Review of Systems  Respiratory:  Negative for shortness of breath.   Cardiovascular:  Negative for chest pain.  Gastrointestinal:  Positive for blood in stool. Negative for abdominal pain.  Neurological:  Positive for light-headedness.    Physical Exam Updated Vital Signs BP (!) 143/66   Pulse 72   Temp 97.6 F (36.4 C) (Oral)   Resp 17   SpO2 100%  Physical Exam Vitals and nursing note reviewed. Exam conducted with a chaperone present.  Constitutional:      General: She is not in acute distress.    Appearance: She is well-developed. She is obese. She is not ill-appearing or diaphoretic.  HENT:     Head: Normocephalic and atraumatic.  Cardiovascular:     Rate and Rhythm: Normal rate and regular rhythm.     Heart sounds: Normal heart sounds.  Pulmonary:     Effort: Pulmonary effort is normal.  Abdominal:     Palpations: Abdomen is soft.     Tenderness: There is no abdominal tenderness.  Genitourinary:    Rectum: No mass, tenderness or external hemorrhoid.     Comments: Mild dark red blood on DRE. Skin:    General: Skin is warm and dry.  Neurological:     Mental Status: She is alert.     ED Results / Procedures / Treatments   Labs (all labs ordered are listed, but only abnormal results are displayed) Labs Reviewed  COMPREHENSIVE METABOLIC PANEL - Abnormal; Notable for the following components:      Result Value   Glucose, Bld 127 (*)    Calcium 8.8 (*)    Total Protein 6.2 (*)    Albumin 3.2 (*)    All other components within normal limits  CBC WITH DIFFERENTIAL/PLATELET - Abnormal; Notable for the following components:   RBC 3.54 (*)    Hemoglobin 10.4 (*)    HCT 32.8 (*)    All other components within normal limits   POC OCCULT BLOOD, ED  TYPE AND SCREEN  ABO/RH    EKG None  Radiology No results found.  Procedures Procedures    Medications Ordered in ED Medications  hydrocortisone (ANUSOL-HC) suppository 25 mg (has no administration in time range)    ED Course/ Medical Decision Making/ A&P                                 Medical Decision Making  Amount and/or Complexity of Data Reviewed External Data Reviewed: labs, radiology and notes.    Details: Hgb 13. Reviewed ED notes Labs: ordered.    Details: Hemoglobin 10.4, almost 3 points down from a week and a half ago.  Risk Decision regarding hospitalization.   Patient's vital signs are unremarkable.  However she feels symptomatically weak and has an almost 3 point drop in her hemoglobin.  Discussed with [place] [place] gastroenterology, they will consult and order the suppositories she has been needing, and I think given the drop and her being on anticoagulation she should be observed.  Given the stable vital signs I do not think a CT angiography is warranted. Discussed with Dr. Kirby Crigler for admission.        Final Clinical Impression(s) / ED Diagnoses Final diagnoses:  Rectal bleeding    Rx / DC Orders ED Discharge Orders     None         Pricilla Loveless, MD 06/02/23 1145

## 2023-06-02 NOTE — Consult Note (Addendum)
Consultation  Referring Provider:   Dr. Criss Alvine Primary Care Physician:  Jeoffrey Massed, MD Primary Gastroenterologist:  Dr. Marina Goodell       Reason for Consultation:     Rectal bleeding while on Eliquis, acute anemia DOA: 06/02/2023         Hospital Day: 1         HPI:   Tabitha Green is a 77 y.o. female with past medical history significant for end-stage renal disease was on dialysis since 2014 status post renal cell transplant 2017, CMV infection, cholelithiasis, diverticulosis, fatty liver, GERD, B12 deficiency, atrial fibrillation diagnosed 11/2022 on Eliquis 5 mg twice daily.  03/10/2023 colonoscopy with Dr. Marina Goodell for FOBT positive, rectal bleeding bowel prep was good.  25 mm pedunculated tubular adenomatous polyp descending colon removed with hot snare, diverticula throughout the entire colon, internal hemorrhoids.  Recall 03/2026 05/22/2023 ER visit for hematochezia showed FOBT positive, vital signs stable, Hgb 13, CT a abdomen pelvis negative 05/23/2023 office visit with Dr. Marina Goodell for ER follow-up given Anusol [facility] suppositories 1 PR nightly for 1 week 05/2021 phone call from patient bleeding every started being off Eliquis was told to restart it and send in Anusol cheaper cream.  Presents to the ER 10/25 with rectal bleeding and dizziness.  Work up notable for  Hgb 10.4 , 10 days ago Hgb 13, BUN 17, creatinine 0.93, normal liver function normal platelets.  Patient with family at bedside, husband. Provided some of the history.  Patient lying in bed states should been off her Eliquis since last seeing Dr. Marina Goodell 10/15 restarted her Eliquis this past Friday a week ago.  She began to have small-volume bright red blood on the toilet paper bleeding again on Wednesday, Thursday she noticed more larger volume bright red blood to maroon blood with bowel movements in the toilet and then last night and this morning patient was walking to the restroom felt lightheaded and dizzy, had another  bowel movement loose large-volume bright red to maroon so presented to the ER. She denies any chest pain, shortness of breath has had left lower back pain but denies any abdominal pain.  Denies any rectal pain. She was never able to start suppositories or cream outpatient for his hemorrhoids. Denies NSAIDs, alcohol.  Abnormal ED labs: Abnormal Labs Reviewed  COMPREHENSIVE METABOLIC PANEL - Abnormal; Notable for the following components:      Result Value   Glucose, Bld 127 (*)    Calcium 8.8 (*)    Total Protein 6.2 (*)    Albumin 3.2 (*)    All other components within normal limits  CBC WITH DIFFERENTIAL/PLATELET - Abnormal; Notable for the following components:   RBC 3.54 (*)    Hemoglobin 10.4 (*)    HCT 32.8 (*)    All other components within normal limits    Past Medical History:  Diagnosis Date   Anemia    when I was on dyalysis   Anxiety    Arthritis    Atrial fibrillation (HCC)    Cataract    Cholelithiasis 04/2021   noted on CT abd/pelv done for R lower abd swelling and MR abd   Chronic renal insufficiency, stage III (moderate) (HCC) 10/2015   Post-transplant baseline sCr 1.2-1.4.     CMV infection [facility]) summer 2017   Valcyte per ID/Renal transplant team   Depression    Diverticulosis    a. 05/2012 colonoscopy   Erosive lichen planus of vulva  Topical steroids (managed by Dr. Robby Sermon [facility] via [facility] outpt services.   ESRD (end stage renal disease) (HCC)    began dialysis 2014--followed by Dr. Lowell Guitar.  Received deceased donor kidney transplant 10/2015.   Fatty liver 10/2021   with hepatomegaly   FSGS (focal segmental glomerulosclerosis)    right kidney; hx of left renal cell cancer and got nephrectomy 1993.   GERD (gastroesophageal reflux disease)    Gout    Heart murmur    (Diastolic) ECHO 08/2013 showed that this murmur is coming from pt's R arm AV fistula   Hiatal hernia    History of renal cell cancer 1993   Left nephrectomy    Hyperlipidemia    Hypertension    Hypothyroidism    Impingement syndrome of left shoulder 04/2016   Dr. Bertram Savin to PT   Inguinal hernia    right   Kidney cancer, primary, with metastasis from kidney to other site [facility])    Lumbar spinal stenosis    Chronic LBP with bilat neurogenic claudication   Metatarsal fracture, pathologic 01/2018   Right; orthocarolina-->post op shoe continued, f/u x-ray planned.   Obesity    Osteoarthritis of left knee 08/2017   severe, diffuse, tricompartmental.  Responded to steroid injection.     Osteoporosis 06/07/2016   DEXA T-score of -3.1.  Fosamax planned 2017 but dental work prevented start..  Pathological toe fx 12/2017--repeat DEXA 08/2018, T-score -3.3 radius. 12/2020 T score radius -3.7.   Pneumonia    Renal transplant recipient 11/06/2015   Baseline Cr as of 09/2020 1.2-1.4   Simple hepatic cyst    2022   Solitary kidney, acquired    SVT (supraventricular tachycardia) (HCC)    UTI (urinary tract infection)    Ventral hernia    Vitamin B12 deficiency 12/2018   Starting replacement as of 12/11/2018    Surgical History:  She  has a past surgical history that includes Total abdominal hysterectomy w/ bilateral salpingoophorectomy (1997); Nephrectomy (1993); colonoscopy with polypectomy; AV fistula placement (Right); Renal biopsy; Colonoscopy (2003; 05/2012); TEE without cardioversion (N/A, 08/27/2013); Vulva / perineum biopsy (2015); Kidney transplant (11/06/2015); DEXA (06/07/2016; 08/22/2018; 12/30/20); Ligation of arteriovenous  fistula (Right, 02/14/2017); Resection of arteriovenous fistula aneurysm (Right, 02/14/2017); transthoracic echocardiogram; Cardiovascular stress test; Cataract extraction; and Hysterectomy abdominal with salpingectomy. Family History:  Her family history includes Cancer in her paternal aunt and sister; Deep vein thrombosis in her mother; Diabetes in her maternal grandfather; Fibroids in her daughter; Heart attack (age of  onset: 18) in her father; Hypertension in her father and mother; Stroke in her paternal grandmother. Social History:   reports that she has never smoked. She has never used smokeless tobacco. She reports current alcohol use. She reports that she does not use drugs.  Prior to Admission medications   Medication Sig Start Date End Date Taking? Authorizing Provider  acetaminophen (TYLENOL) 500 MG tablet Take 1,000 mg by mouth every 8 (eight) hours as needed for moderate pain.    [provider]  apixaban (ELIQUIS) 5 MG TABS tablet Take 1 tablet (5 mg total) by mouth 2 (two) times daily. 12/13/22   McGowen, Maryjean Morn, MD  AYR SALINE NASAL DROPS NA Place 2 sprays into both nostrils daily.    [provider]  Camphor-Eucalyptus-Menthol (VICKS VAPORUB EX) Apply 1 application topically daily.    [provider]  Cyanocobalamin (B-12 PO) Take 1 tablet by mouth daily.    [provider]  doxazosin (CARDURA) 2  MG tablet TAKE 1 TABLET(2 MG) BY MOUTH TWICE DAILY 02/16/23   McGowen, Maryjean Morn, MD  hydrocortisone (ANUSOL-HC) 2.5 % rectal cream Place 1 Application rectally 2 (two) times daily. 01/23/23   Doree Albee, PA-C  hydrocortisone (ANUSOL-HC) 2.5 % rectal cream Place 1 Application rectally 2 (two) times daily. 05/30/23   Hilarie Fredrickson, MD  hydrocortisone (ANUSOL-HC) 25 MG suppository Place 1 suppository (25 mg total) rectally every 12 (twelve) hours. 05/23/23   Hilarie Fredrickson, MD  levothyroxine (SYNTHROID) 125 MCG tablet 1/2 tab po on two days a week, whole tab all other days Patient taking differently: Take 62.5-125 mcg by mouth See admin instructions. 1/2 tab po on two days a week Wed and Sat, and whole tab all other days 11/28/22   McGowen, Maryjean Morn, MD  LORazepam (ATIVAN) 0.5 MG tablet Take 1 tablet (0.5 mg total) by mouth 2 (two) times daily as needed for anxiety. 08/12/22   McGowen, Maryjean Morn, MD  losartan (COZAAR) 100 MG tablet TAKE 1 TABLET BY MOUTH DAILY 05/08/23    McGowen, Maryjean Morn, MD  magnesium chloride (SLOW-MAG) 64 MG TBEC SR tablet Take 2 tablets (128 mg total) by mouth daily. 05/11/23   McGowen, Maryjean Morn, MD  mycophenolate (MYFORTIC) 360 MG TBEC EC tablet Take 360 mg by mouth 2 (two) times daily.    [provider]  nystatin powder Apply 1 Application topically once a week.    [provider]  silver sulfADIAZINE (SILVADENE) 1 % cream Apply 1 Application topically daily.    [provider]  sodium bicarbonate 650 MG tablet Take 1 tablet (650 mg total) by mouth 2 (two) times daily. Takes 1 in AM and 1 at bedtime 08/25/22   McGowen, Maryjean Morn, MD  tacrolimus (PROGRAF) 1 MG capsule Take 2 mg by mouth 2 (two) times daily.    [provider]  triamcinolone cream (KENALOG) 0.1 % Apply 1 application topically daily.    [provider]  verapamil (CALAN-SR) 120 MG CR tablet TAKE 1 TABLET BY MOUTH EVERY  AFTERNOON 04/11/23   McGowen, Maryjean Morn, MD  verapamil (CALAN-SR) 240 MG CR tablet TAKE 1 TABLET BY MOUTH EVERY  MORNING 05/08/23   McGowen, Maryjean Morn, MD  Vitamin D, Ergocalciferol, (DRISDOL) 1.25 MG (50000 UNIT) CAPS capsule Take 1 capsule (50,000 Units total) by mouth 2 (two) times a week. 05/15/23   McGowen, Maryjean Morn, MD    Current Facility-Administered Medications  Medication Dose Route Frequency Provider Last Rate Last Admin   hydrocortisone (ANUSOL-HC) suppository 25 mg  25 mg Rectal BID Doree Albee, PA-C       Current Outpatient Medications  Medication Sig Dispense Refill   acetaminophen (TYLENOL) 500 MG tablet Take 1,000 mg by mouth every 8 (eight) hours as needed for moderate pain.     apixaban (ELIQUIS) 5 MG TABS tablet Take 1 tablet (5 mg total) by mouth 2 (two) times daily. 180 tablet 1   AYR SALINE NASAL DROPS NA Place 2 sprays into both nostrils daily.     Camphor-Eucalyptus-Menthol (VICKS VAPORUB EX) Apply 1 application topically daily.     Cyanocobalamin (B-12 PO) Take 1 tablet by mouth daily.      doxazosin (CARDURA) 2 MG tablet TAKE 1 TABLET(2 MG) BY MOUTH TWICE DAILY 180 tablet 3   hydrocortisone (ANUSOL-HC) 2.5 % rectal cream Place 1 Application rectally 2 (two) times daily. 30 g 2   hydrocortisone (ANUSOL-HC) 2.5 % rectal cream Place 1 Application rectally  2 (two) times daily. 30 g 1   hydrocortisone (ANUSOL-HC) 25 MG suppository Place 1 suppository (25 mg total) rectally every 12 (twelve) hours. 30 suppository 0   levothyroxine (SYNTHROID) 125 MCG tablet 1/2 tab po on two days a week, whole tab all other days (Patient taking differently: Take 62.5-125 mcg by mouth See admin instructions. 1/2 tab po on two days a week Wed and Sat, and whole tab all other days) 90 tablet 3   LORazepam (ATIVAN) 0.5 MG tablet Take 1 tablet (0.5 mg total) by mouth 2 (two) times daily as needed for anxiety. 30 tablet 1   losartan (COZAAR) 100 MG tablet TAKE 1 TABLET BY MOUTH DAILY 90 tablet 1   magnesium chloride (SLOW-MAG) 64 MG TBEC SR tablet Take 2 tablets (128 mg total) by mouth daily. 180 tablet 1   mycophenolate (MYFORTIC) 360 MG TBEC EC tablet Take 360 mg by mouth 2 (two) times daily.     nystatin powder Apply 1 Application topically once a week.     silver sulfADIAZINE (SILVADENE) 1 % cream Apply 1 Application topically daily.     sodium bicarbonate 650 MG tablet Take 1 tablet (650 mg total) by mouth 2 (two) times daily. Takes 1 in AM and 1 at bedtime 180 tablet 3   tacrolimus (PROGRAF) 1 MG capsule Take 2 mg by mouth 2 (two) times daily.     triamcinolone cream (KENALOG) 0.1 % Apply 1 application topically daily.     verapamil (CALAN-SR) 120 MG CR tablet TAKE 1 TABLET BY MOUTH EVERY  AFTERNOON 90 tablet 0   verapamil (CALAN-SR) 240 MG CR tablet TAKE 1 TABLET BY MOUTH EVERY  MORNING 90 tablet 1   Vitamin D, Ergocalciferol, (DRISDOL) 1.25 MG (50000 UNIT) CAPS capsule Take 1 capsule (50,000 Units total) by mouth 2 (two) times a week. 24 capsule 0    Allergies as of 06/02/2023 - Review Complete  06/02/2023  Allergen Reaction Noted   Labetalol  09/29/2020   Ceclor [cefaclor] Rash 10/09/2009   Naproxen Rash 10/09/2009   Enalapril maleate Cough 10/09/2009   Metoprolol tartrate Rash 10/09/2009    Review of Systems:    Constitutional: No weight loss, fever, chills, weakness or fatigue HEENT: Eyes: No change in vision               Ears, Nose, Throat:  No change in hearing or congestion Skin: No rash or itching Cardiovascular: No chest pain, chest pressure or palpitations   Respiratory: No SOB or cough Gastrointestinal: See HPI and otherwise negative Genitourinary: No dysuria or change in urinary frequency Neurological: No headache, dizziness or syncope Musculoskeletal: No new muscle or joint pain Hematologic: No bleeding or bruising Psychiatric: No history of depression or anxiety     Physical Exam:  Vital signs in last 24 hours: Temp:  [97.6 F (36.4 C)] 97.6 F (36.4 C) (10/25 0851) Pulse Rate:  [72] 72 (10/25 0851) Resp:  [17] 17 (10/25 0851) BP: (142-143)/(66-77) 143/66 (10/25 0915) SpO2:  [99 %-100 %] 100 % (10/25 0915)   Last BM recorded by nurses in past 5 days No data recorded  General:   Pleasant, well developed female in no acute distress Head:  Normocephalic and atraumatic. Eyes: sclerae anicteric,conjunctive pink  Heart:  regular rate and rhythm, no murmurs or gallops Pulm: Clear anteriorly; no wheezing Abdomen:  Soft, Obese AB, Active bowel sounds. No tenderness Extremities:  Without edema. Msk:  Symmetrical without gross deformities. Peripheral pulses intact.  Neurologic:  Alert  and  oriented x4;  No focal deficits.  Skin:   Dry and intact without significant lesions or rashes. Psychiatric:  Cooperative. Normal mood and affect.  LAB RESULTS: Recent Labs    06/02/23 0920  WBC 7.8  HGB 10.4*  HCT 32.8*  PLT 253   BMET Recent Labs    06/02/23 0920  NA 139  K 3.9  CL 108  CO2 22  GLUCOSE 127*  BUN 17  CREATININE 0.93  CALCIUM 8.8*    LFT Recent Labs    06/02/23 0920  PROT 6.2*  ALBUMIN 3.2*  AST 18  ALT 14  ALKPHOS 66  BILITOT 0.8   PT/INR No results for input(s): "LABPROT", "INR" in the last 72 hours.  STUDIES: No results found.    Impression    Acute symptomatic anemia in setting of Eliquis with hematochezia Colonoscopy 03/10/2023 for rectal bleeding with 25 mm pedunculated tubular adenomatous polyp, internal hemorrhoids and diverticulosis, recall 3 years Patient continuing bright red blood per rectum, worsened after restarting eliquis a week ago 05/22/2023 CT abdomen pelvis negative, hemoglobin 13 Now presenting with 3 g drop of hemoglobin  Atrial fibrillation  On Eliquis since 11/2022, on hold for now Ejection fraction unremarkable  history significant for end-stage renal disease was on dialysis since 2014 status post renal transplant 2017  B12 def   Active Problems:   * No active hospital problems. *    LOS: 0 days     Plan   Most likely hemorrhoidal bleeding, patient is not been able to get suppositories.  Possible diverticular bleed with volume.   Less likely postprocedural polyp bleeding since has been over a month -Check iron, ferritin replace if low - If patient has large volume rectal bleeding again, please get CTA and consult IR if positive.  --Continue to monitor H&H with transfusion as needed to maintain hemoglobin greater than 7. -Will do inpatient hydrocortisone suppositories twice daily -Can consider outpatient hemorrhoidal banding. -Patient has been having worsening bleeding since being on Eliquis in April, may need to consider Watchman device with cardiology, or other procedure to allow patient to be off of Eliquis.  Can follow-up with cardiology outpatient.  Thank you for your kind consultation, we will continue to follow.   Doree Albee  06/02/2023, 11:09 AM    Attending physician's note   I have taken history, reviewed the chart and examined the patient. I  performed a substantive portion of this encounter, including complete performance of at least one of the key components, in conjunction with the APP. I agree with the Advanced Practitioner's note, impression and recommendations.   Painless hematochezia-highly s/o diverticular bleed after restarting Eliquis (last dose 10/24 ). Hb 13 to 9. HD stable. No further bleeding since adm. Nl BUN.  Doubt UGI bleeding. Neg CTA 10/14  Significant colon polyp s/p recent polypectomy 03/10/2023 (Bx-tubular adenoma).  Also had pancolonic diverticulosis. Recall colon 03/2026 (Dr Marina Goodell)  H/O ESRD s/p renal transplant 2017, now with large right ventral hernia without obstruction (no tenderness or Abdo pain)  A-fib on Eliquis.  Currently in NSR  Plan: -Trend CBC. Keep Hb >7 -If any active bleeding, CTA -Given recent colon as above, would like to hold off on rpt colon this adm unless continued active bleeding. -If no bleeding in 24 hours, can resume Eliquis.  Certainly will increase risk of rebleeding.  Recommend to clarify need of Eliquis with cardiology as outpt or any other alternatives. -Full liquid diet.   Edman Circle, MD  [facility] [phone]

## 2023-06-02 NOTE — ED Triage Notes (Signed)
BIB [facility] from home c/o moderate amount bright red blood in stool x 2 days. Admits to loose stools, nausea Denies emesis. Eliquis daily. States she has diverticulosis.

## 2023-06-03 DIAGNOSIS — K625 Hemorrhage of anus and rectum: Secondary | ICD-10-CM | POA: Diagnosis not present

## 2023-06-03 LAB — HEMOGLOBIN AND HEMATOCRIT, BLOOD
HCT: 27.8 % — ABNORMAL LOW (ref 36.0–46.0)
Hemoglobin: 8.7 g/dL — ABNORMAL LOW (ref 12.0–15.0)

## 2023-06-03 NOTE — TOC Initial Note (Signed)
Transition of Care [facility]) - Initial/Assessment Note    Patient Details  Name: Tabitha Green MRN: [id number] Date of Birth: 05/18/1946  [facility]) CM/SW Contact:    Adrian Prows, RN Phone Number: 06/03/2023, 4:37 PM  Clinical Narrative:                 Sherron Monday w/ and dtr Megan pt in room; pt says she is from home w/ her spouse; she plans to return at d/c; she verified she has insurance and PCP; pt says she has transportation; she denies SDOH risk; she has glasses, shower chair, and grab bars in shower; pt says she does not have HH services or home oxygen; TOC will follow.  Expected Discharge Plan: Home/Self Care Barriers to Discharge: Continued Medical Work up   Patient Goals and CMS Choice Patient states their goals for this hospitalization and ongoing recovery are:: home          Expected Discharge Plan and Services   Discharge Planning Services: CM Consult   Living arrangements for the past 2 months: Single Family Home                                      Prior Living Arrangements/Services Living arrangements for the past 2 months: Single Family Home Lives with:: Spouse Patient language and need for interpreter reviewed:: Yes Do you feel safe going back to the place where you live?: Yes      Need for Family Participation in Patient Care: Yes (Comment) Care giver support system in place?: Yes (comment) Current home services:  (n/a) Criminal Activity/Legal Involvement Pertinent to Current Situation/Hospitalization: No - Comment as needed  Activities of Daily Living   ADL Screening (condition at time of admission) Independently performs ADLs?: Yes (appropriate for developmental age) Is the patient deaf or have difficulty hearing?: No Does the patient have difficulty seeing, even when wearing glasses/contacts?: No Does the patient have difficulty concentrating, remembering, or making decisions?: No  Permission Sought/Granted Permission  sought to share information with : Case Manager Permission granted to share information with : Yes, Verbal Permission Granted  Share Information with NAME: Case Manager     Permission granted to share info w Relationship: Rhondia Navalta (spouse) [phone]     Emotional Assessment Appearance:: Appears stated age Attitude/Demeanor/Rapport: Gracious Affect (typically observed): Accepting Orientation: : Oriented to Self, Oriented to Place, Oriented to  Time, Oriented to Situation Alcohol / Substance Use: Not Applicable Psych Involvement: No (comment)  Admission diagnosis:  Rectal bleeding [K62.5] BRBPR (bright red blood per rectum) [K62.5] Patient Active Problem List   Diagnosis Date Noted   BRBPR (bright red blood per rectum) 06/02/2023   Persistent atrial fibrillation (HCC) 12/29/2022   Paroxysmal atrial fibrillation (HCC) 12/01/2022   Hypercoagulable state due to persistent atrial fibrillation (HCC) 12/01/2022   Combined forms of age-related cataract of right eye 10/20/2020   COVID-19 08/06/2020   Lumbar spinal stenosis 02/05/2019   Cherry angioma 01/14/2018   Lentigines 01/14/2018   Multiple benign nevi 01/14/2018   Pain in left knee 08/28/2017   Seborrheic keratosis 11/23/2016   Left shoulder pain 05/02/2016   Wheeze 09/16/2015   Cough 09/16/2015   Acute bronchitis 09/16/2015   History of renal carcinoma 05/14/2015   Vulvar ulceration 06/22/2014   End stage renal disease (HCC) 04/09/2014   Mechanical complication of other vascular device, implant, and graft 04/09/2014  Cervical spondylosis 10/10/2013   Aortic insufficiency 08/20/2013   Hyperglycemia 12/14/2012   SVT (supraventricular tachycardia) (HCC) 12/13/2012   Uremia 12/13/2012   Chronic kidney disease (CKD), stage IV (severe) (HCC) 12/07/2012   Bradycardia 12/07/2012   VITAMIN D DEFICIENCY 09/08/2009   CHRON GLOMERULONEPHRIT W/LES MEMBRANOUS GLN 09/08/2009   FATIGUE 09/08/2009   DIVERTICULOSIS, COLON  09/26/2008   OSTEOPENIA 09/26/2008   History of colonic polyps 09/26/2008   Hypothyroidism 10/02/2007   OTHER AND UNSPECIFIED HYPERLIPIDEMIA 10/02/2007   BENIGN POSITIONAL VERTIGO 10/02/2007   Unspecified essential hypertension 10/02/2007   Gout, unspecified 03/26/2007   DISORDER, DYSMETABOLIC SYNDROME X 03/26/2007   HX, PERSONAL, MALIGNANCY, KIDNEY 03/26/2007   OSTEOARTHRITIS 12/26/2006   PCP:  Jeoffrey Massed, MD Pharmacy:   [facility] [phone] - [place], [place] - [address] AT [facility] OF [address] [place] [address] Phone: [phone] Fax: [phone]  [id number] - [place], [place] - [address] [place] [address] Phone: [phone] Fax: [phone]  [facility]) - [place], [place] - [address] [facility] [address] [place] [address] [place] [address] Phone: [phone] Fax: [phone]  [facility] - [place], [place] - [address] [place]  [address] Phone: [phone] Fax: [phone]     Social Determinants of Health (SDOH) Social History: SDOH Screenings   Food Insecurity: No Food Insecurity (06/03/2023)  Housing: Low Risk  (06/03/2023)  Transportation Needs: No Transportation Needs (06/03/2023)  Utilities: Not At Risk (06/03/2023)  Alcohol Screen: Low Risk  (07/22/2020)  Depression (PHQ2-9): Medium Risk (05/11/2023)  Financial Resource Strain: Low Risk  (09/14/2022)  Physical Activity: Insufficiently Active (09/14/2022)  Social Connections: Moderately Integrated (09/14/2022)  Stress: Stress Concern Present (09/14/2022)  Tobacco Use: Low Risk  (06/02/2023)   SDOH Interventions: Food Insecurity Interventions: Intervention Not Indicated, Inpatient TOC Housing Interventions: Intervention Not Indicated, Inpatient TOC Transportation Interventions: Intervention Not Indicated,  Inpatient TOC Utilities Interventions: Intervention Not Indicated, Inpatient TOC   Readmission Risk Interventions     No data to display

## 2023-06-03 NOTE — Progress Notes (Signed)
Mobility Specialist - Progress Note  Pre-mobility: 80 bpm HR,  During mobility: 118 bpm HR,  Post-mobility: 90 bpm HR,    06/03/23 1455  Mobility  Activity Ambulated with assistance in hallway  Level of Assistance Standby assist, set-up cues, supervision of patient - no hands on  Assistive Device Other (Comment) (Hallway Rails)  Distance Ambulated (ft) 200 ft  Range of Motion/Exercises Active  Activity Response Tolerated well  Mobility Referral Yes  $Mobility charge 1 Mobility  Mobility Specialist Start Time (ACUTE ONLY) 1445  Mobility Specialist Stop Time (ACUTE ONLY) 1455  Mobility Specialist Time Calculation (min) (ACUTE ONLY) 10 min   Pt was found in bed and agreeable to ambulate. No complaints with session. Grew fatigued. At EOS returned to bed with all needs met. Call bell in reach and family in room.  Billey Chang Mobility Specialist

## 2023-06-03 NOTE — Progress Notes (Signed)
PROGRESS NOTE    Tabitha Green  XLK:[id number] DOB: 12/02/1945 DOA: 06/02/2023 PCP: Jeoffrey Massed, MD   Brief Narrative:  HPI: Tabitha Green is a 77 y.o. female with medical history significant for relation diagnosed April 2024 on Eliquis 5 mg p.o. twice daily, known diverticulosis and internal hemorrhoids as well as GERD, FSGS status post kidney transplant being admitted to the hospital with acute blood loss anemia and bright red blood per rectum.  The history of some rectal bleeding in August of this year, she had colonoscopy with evidence of diverticula and internal hemorrhoids.  Developed some hematochezia on 10/14, since that time initially held her Eliquis had an office visit with Dr. Marina Goodell and he recommended that he hold her Eliquis until the bleeding stopped.  She restarted her Eliquis on 10/20 but over the last 2 to 3 days she has had recurrence of intermittent bright red blood per rectum mixed with maroon stools.  Today she had a large loose bowel movement that was maroon, felt dizzy and lightheaded when she was ambulating and so came to the ER for evaluation.  Denies any significant abdominal pain, rectal pain, or nausea.  No chest pain, palpitations, fevers, chills or vomiting.  In the emergency department vital signs are unremarkable.  Hemoglobin of 10.4, down from 13 at an outside facility about 10 days ago.  Hospitalist was contacted for observation admission, and patient has been seen by gastroenterology in the meantime.    Assessment & Plan:   Principal Problem:   BRBPR (bright red blood per rectum)  Acute blood loss anemia secondary to bright red blood per rectum secondary to Eliquis-likely diverticular bleed versus internal hemorrhoids, versus other.  Hemodynamically stable.  Negligible drop in hemoglobin.  No further rectal bleeding since arrival to ED.  GI on board.  Plan to manage conservatively and continue to hold anticoagulation and observe another day and repeat CBC  in the morning.  Discussed with patient.  Atrial fibrillation-currently in sinus rhythm, hold Eliquis in the setting of bright red blood per rectum with blood loss anemia.  Patient would benefit from outpatient cardiology follow-up and alternative therapy, due to inability to tolerate anticoagulation.   Hypertension-blood pressure controlled.  Continue home verapamil, with as needed IV hydralazine   History of FSGS and renal transplant-continue Myfortic, Prograf and oral bicarb, renal function is at baseline   Hypothyroidism-Synthroid   Anxiety-lorazepam 0.5 mg p.o. twice daily as needed.   DVT prophylaxis: SCDs Start: 06/02/23 1153   Code Status: Full Code  Family Communication:  None present at bedside.  Plan of care discussed with patient in length and he/she verbalized understanding and agreed with it.  Status is: Observation The patient will require care spanning > 2 midnights and should be moved to inpatient because: Needs another night of observation.   Estimated body mass index is 40.44 kg/m as calculated from the following:   Height as of 05/23/23: 5\' 5"  (1.651 m).   Weight as of 05/23/23: 110.2 kg.    Nutritional Assessment: There is no height or weight on file to calculate BMI.. Seen by dietician.  I agree with the assessment and plan as outlined below: Nutrition Status:        . Skin Assessment: I have examined the patient's skin and I agree with the wound assessment as performed by the wound care RN as outlined below:    Consultants:  GI  Procedures:  None  Antimicrobials:  Anti-infectives (From admission, onward)  None         Subjective: Patient seen and examined.  She has no complaints.  Sister at the bedside.  Objective: Vitals:   06/02/23 1613 06/02/23 1642 06/02/23 2049 06/03/23 0540  BP: (!) 140/80 136/79 124/64 (!) 163/91  Pulse:  60 72 72  Resp:  18 18 18   Temp:  98.2 F (36.8 C) 97.9 F (36.6 C) 97.7 F (36.5 C)  TempSrc:   Oral Oral Oral  SpO2:  99% 98% 98%    Intake/Output Summary (Last 24 hours) at 06/03/2023 1258 Last data filed at 06/03/2023 0500 Gross per 24 hour  Intake --  Output 700 ml  Net -700 ml   There were no vitals filed for this visit.  Examination:  General exam: Appears calm and comfortable  Respiratory system: Clear to auscultation. Respiratory effort normal. Cardiovascular system: S1 & S2 heard, RRR. No JVD, murmurs, rubs, gallops or clicks. No pedal edema. Gastrointestinal system: Abdomen is nondistended, soft and nontender. No organomegaly or masses felt. Normal bowel sounds heard. Central nervous system: Alert and oriented. No focal neurological deficits. Extremities: Symmetric 5 x 5 power. Skin: No rashes, lesions or ulcers Psychiatry: Judgement and insight appear normal. Mood & affect appropriate.    Data Reviewed: I have personally reviewed following labs and imaging studies  CBC: Recent Labs  Lab 06/02/23 0920 06/02/23 1218 06/02/23 1911 06/03/23 0533  WBC 7.8  --   --   --   NEUTROABS 5.4  --   --   --   HGB 10.4* 9.1* 9.0* 8.7*  HCT 32.8* 28.6* 28.8* 27.8*  MCV 92.7  --   --   --   PLT 253  --   --   --    Basic Metabolic Panel: Recent Labs  Lab 06/02/23 0920  NA 139  K 3.9  CL 108  CO2 22  GLUCOSE 127*  BUN 17  CREATININE 0.93  CALCIUM 8.8*   GFR: Estimated Creatinine Clearance: 62.6 mL/min (by C-G formula based on SCr of 0.93 mg/dL). Liver Function Tests: Recent Labs  Lab 06/02/23 0920  AST 18  ALT 14  ALKPHOS 66  BILITOT 0.8  PROT 6.2*  ALBUMIN 3.2*   No results for input(s): "LIPASE", "AMYLASE" in the last 168 hours. No results for input(s): "AMMONIA" in the last 168 hours. Coagulation Profile: No results for input(s): "INR", "PROTIME" in the last 168 hours. Cardiac Enzymes: No results for input(s): "CKTOTAL", "CKMB", "CKMBINDEX", "TROPONINI" in the last 168 hours. BNP (last 3 results) No results for input(s): "PROBNP" in the last  8760 hours. HbA1C: No results for input(s): "HGBA1C" in the last 72 hours. CBG: No results for input(s): "GLUCAP" in the last 168 hours. Lipid Profile: No results for input(s): "CHOL", "HDL", "LDLCALC", "TRIG", "CHOLHDL", "LDLDIRECT" in the last 72 hours. Thyroid Function Tests: No results for input(s): "TSH", "T4TOTAL", "FREET4", "T3FREE", "THYROIDAB" in the last 72 hours. Anemia Panel: No results for input(s): "VITAMINB12", "FOLATE", "FERRITIN", "TIBC", "IRON", "RETICCTPCT" in the last 72 hours. Sepsis Labs: No results for input(s): "PROCALCITON", "LATICACIDVEN" in the last 168 hours.  No results found for this or any previous visit (from the past 240 hour(s)).   Radiology Studies: No results found.  Scheduled Meds:  doxazosin  2 mg Oral BID   hydrocortisone  25 mg Rectal BID   levothyroxine  125 mcg Oral Once per day on Monday Tuesday Wednesday Thursday Friday Saturday   losartan  100 mg Oral Daily   magnesium chloride  2 tablet Oral Daily   mycophenolate  360 mg Oral BID   sodium bicarbonate  650 mg Oral BID   tacrolimus  2 mg Oral BID   verapamil  240 mg Oral q morning   Continuous Infusions:   LOS: 0 days   Hughie Closs, MD [facility]  06/03/2023, 12:58 PM   *Please note that this is a verbal dictation therefore any spelling or grammatical errors are due to the "[employer]agon [employer] One" system interpretation.  Please page via Amion and do not message via secure chat for urgent patient care matters. Secure chat can be used for non urgent patient care matters.  How to contact the [facility] Attending or Consulting provider 7A - 7P or covering provider during after hours 7P -7A, for this patient?  Check the care team in [facility] and look for a) attending/consulting [facility] provider listed and b) the [facility] team listed. Page or secure chat 7A-7P. Log into [email or web address] and use [employer]'s universal password to access. If you do not have the password, please contact the hospital  operator. Locate the [facility] provider you are looking for under [facility] and page to a number that you can be directly reached. If you still have difficulty reaching the provider, please page the [facility] (Director on Call) for the Hospitalists listed on amion for assistance.

## 2023-06-03 NOTE — Plan of Care (Signed)
  Problem: Clinical Measurements: Goal: Diagnostic test results will improve Outcome: Progressing Goal: Cardiovascular complication will be avoided Outcome: Progressing   Problem: Activity: Goal: Risk for activity intolerance will decrease Outcome: Progressing   Problem: Nutrition: Goal: Adequate nutrition will be maintained Outcome: Progressing   Problem: Skin Integrity: Goal: Risk for impaired skin integrity will decrease Outcome: Progressing   Problem: Education: Goal: Knowledge of General Education information will improve Description: Including pain rating scale, medication(s)/side effects and non-pharmacologic comfort measures Outcome: Adequate for Discharge   Problem: Health Behavior/Discharge Planning: Goal: Ability to manage health-related needs will improve Outcome: Adequate for Discharge   Problem: Clinical Measurements: Goal: Will remain free from infection Outcome: Adequate for Discharge Goal: Respiratory complications will improve Outcome: Adequate for Discharge   Problem: Pain Management: Goal: General experience of comfort will improve Outcome: Adequate for Discharge   Problem: Safety: Goal: Ability to remain free from injury will improve Outcome: Adequate for Discharge

## 2023-06-03 NOTE — Progress Notes (Signed)
     Progress Note    ASSESSMENT AND PLAN:   Diverticular bleed after restarting Eliquis (last dose 10/24 ). Hb 13 to 9 to 8.7. HD stable. No further bleeding since adm. Neg CTA 10/14   Significant colon polyp s/p recent polypectomy 03/10/2023 (Bx-tubular adenoma).  Also had pancolonic diverticulosis. Recall colon 03/2026 (Dr Marina Goodell)   A-fib on Eliquis.  Currently in NSR   Plan: -Trend CBC. Keep Hb >7 -If any active bleeding, CTA -Advance diet -Continue to hold Eliquis for now. -No plans for repeat colonoscopy this admission. -D/W pt and sister.     SUBJECTIVE   No further bleeding Mild dizziness Denies having any abdominal pain or complaints  Patient's sister in the room.   Hemoglobin slightly down to 8.7   OBJECTIVE:     Vital signs in last 24 hours: Temp:  [97.7 F (36.5 C)-98.2 F (36.8 C)] 97.7 F (36.5 C) (10/26 0540) Pulse Rate:  [60-76] 72 (10/26 0540) Resp:  [15-24] 18 (10/26 0540) BP: (124-163)/(64-91) 163/91 (10/26 0540) SpO2:  [94 %-99 %] 98 % (10/26 0540) Last BM Date : 06/02/23 General:   Alert, well-developed female in NAD EENT:  Normal hearing, non icteric sclera, conjunctive pink.  Heart:  Regular rate and rhythm; no murmur.  No lower extremity edema   Pulm: Normal respiratory effort, lungs CTA bilaterally without wheezes or crackles. Abdomen:  Soft, nondistended, nontender.  Normal bowel sounds.  Large ventral hernia predominantly right lower abdomen without tenderness.     Neurologic:  Alert and  oriented x4;  grossly normal neurologically. Psych:  Pleasant, cooperative.  Normal mood and affect.   Intake/Output from previous day: 10/25 0701 - 10/26 0700 In: -  Out: 700 [Urine:700] Intake/Output this shift: No intake/output data recorded.  Lab Results: Recent Labs    06/02/23 0920 06/02/23 1218 06/02/23 1911 06/03/23 0533  WBC 7.8  --   --   --   HGB 10.4* 9.1* 9.0* 8.7*  HCT 32.8* 28.6* 28.8* 27.8*  PLT 253  --   --   --     BMET Recent Labs    06/02/23 0920  NA 139  K 3.9  CL 108  CO2 22  GLUCOSE 127*  BUN 17  CREATININE 0.93  CALCIUM 8.8*   LFT Recent Labs    06/02/23 0920  PROT 6.2*  ALBUMIN 3.2*  AST 18  ALT 14  ALKPHOS 66  BILITOT 0.8   PT/INR No results for input(s): "LABPROT", "INR" in the last 72 hours. Hepatitis Panel No results for input(s): "HEPBSAG", "HCVAB", "HEPAIGM", "HEPBIGM" in the last 72 hours.  No results found.   Principal Problem:   BRBPR (bright red blood per rectum)     LOS: 0 days     Edman Circle, MD 06/03/2023, 10:11 AM [facility] [phone]

## 2023-06-03 NOTE — Care Management Obs Status (Signed)
MEDICARE OBSERVATION STATUS NOTIFICATION   Patient Details  Name: Tabitha Green MRN: [id number] Date of Birth: [birth year]   Medicare Observation Status Notification Given:  Yes    Adrian Prows, RN 06/03/2023, 4:34 PM

## 2023-06-04 DIAGNOSIS — D62 Acute posthemorrhagic anemia: Secondary | ICD-10-CM | POA: Insufficient documentation

## 2023-06-04 DIAGNOSIS — K625 Hemorrhage of anus and rectum: Secondary | ICD-10-CM | POA: Diagnosis not present

## 2023-06-04 LAB — CBC WITH DIFFERENTIAL/PLATELET
Abs Immature Granulocytes: 0.02 10*3/uL (ref 0.00–0.07)
Basophils Absolute: 0 10*3/uL (ref 0.0–0.1)
Basophils Relative: 1 %
Eosinophils Absolute: 0.3 10*3/uL (ref 0.0–0.5)
Eosinophils Relative: 5 %
HCT: 29 % — ABNORMAL LOW (ref 36.0–46.0)
Hemoglobin: 9 g/dL — ABNORMAL LOW (ref 12.0–15.0)
Immature Granulocytes: 0 %
Lymphocytes Relative: 33 %
Lymphs Abs: 2.1 10*3/uL (ref 0.7–4.0)
MCH: 28.9 pg (ref 26.0–34.0)
MCHC: 31 g/dL (ref 30.0–36.0)
MCV: 93.2 fL (ref 80.0–100.0)
Monocytes Absolute: 0.5 10*3/uL (ref 0.1–1.0)
Monocytes Relative: 8 %
Neutro Abs: 3.4 10*3/uL (ref 1.7–7.7)
Neutrophils Relative %: 53 %
Platelets: 241 10*3/uL (ref 150–400)
RBC: 3.11 MIL/uL — ABNORMAL LOW (ref 3.87–5.11)
RDW: 13.7 % (ref 11.5–15.5)
WBC: 6.3 10*3/uL (ref 4.0–10.5)
nRBC: 0 % (ref 0.0–0.2)

## 2023-06-04 MED ORDER — APIXABAN 5 MG PO TABS
5.0000 mg | ORAL_TABLET | Freq: Two times a day (BID) | ORAL | Status: DC
  Administered 2023-06-04 – 2023-06-05 (×2): 5 mg via ORAL
  Filled 2023-06-04 (×2): qty 1

## 2023-06-04 MED ORDER — HYDROCORTISONE (PERIANAL) 2.5 % EX CREA
TOPICAL_CREAM | Freq: Two times a day (BID) | CUTANEOUS | Status: DC
  Filled 2023-06-04: qty 28.35

## 2023-06-04 NOTE — Plan of Care (Signed)
  Problem: Safety: Goal: Ability to remain free from injury will improve Outcome: Progressing   Problem: Skin Integrity: Goal: Risk for impaired skin integrity will decrease Outcome: Progressing   Problem: Elimination: Goal: Will not experience complications related to bowel motility Outcome: Progressing   Problem: Coping: Goal: Level of anxiety will decrease Outcome: Progressing   Problem: Education: Goal: Knowledge of General Education information will improve Description: Including pain rating scale, medication(s)/side effects and non-pharmacologic comfort measures Outcome: Progressing   Problem: Clinical Measurements: Goal: Ability to maintain clinical measurements within normal limits will improve Outcome: Progressing   Problem: Clinical Measurements: Goal: Diagnostic test results will improve Outcome: Progressing

## 2023-06-04 NOTE — Plan of Care (Signed)

## 2023-06-04 NOTE — Progress Notes (Signed)
     Progress Note    ASSESSMENT AND PLAN:   Diverticular bleed (likely) after restarting Eliquis (last dose 10/24 ). Hb 13 to 9 to 8.7 to 9. HD stable. Neg CTA 10/14   Significant colon polyp s/p recent polypectomy 03/10/2023 (Bx-tubular adenoma).  Also had pancolonic diverticulosis. Recall colon 03/2026 (Dr Marina Goodell)   A-fib on Eliquis.  Currently in NSR   Plan: -She is very concerned about recurrent bleeding.  Can start Eliquis today. -If no further bleeding in AM and hemoglobin is stable, can D/C home with GI FU. -Do recommend FU with cardiology as outpt (need for eliquis or any other alternatives like watchman's procedure to get her off [facility]) -Dr Leone Payor taking over the service tomorrow.      SUBJECTIVE   Had 1 episode of rectal bleeding-old blood.  He also had hemorrhoids. Hemoglobin is stable No abdominal pain Tolerating regular diet well   Very concerned about bleeding. Would like to start Eliquis tonight     OBJECTIVE:     Vital signs in last 24 hours: Temp:  [97.6 F (36.4 C)-99 F (37.2 C)] 97.6 F (36.4 C) (10/27 0452) Pulse Rate:  [67-78] 68 (10/27 0452) Resp:  [16-20] 20 (10/27 0452) BP: (152-162)/(67-81) 159/81 (10/27 0452) SpO2:  [97 %-99 %] 99 % (10/27 0452) Weight:  [112.9 kg] 112.9 kg (10/26 2000) Last BM Date : 06/03/23 General:   Alert, well-developed female in NAD EENT:  Normal hearing, non icteric sclera, conjunctive pink.  Heart:  Regular rate and rhythm; no murmur.  No lower extremity edema   Pulm: Normal respiratory effort, lungs CTA bilaterally without wheezes or crackles. Abdomen:  Soft, nondistended, nontender.  Normal bowel sounds,.       Neurologic:  Alert and  oriented x4;  grossly normal neurologically. Psych:  Pleasant, cooperative.  Normal mood and affect.   Intake/Output from previous day: 10/26 0701 - 10/27 0700 In: 120 [P.O.:120] Out: 1200 [Urine:1200] Intake/Output this shift: No intake/output data recorded.  Lab  Results: Recent Labs    06/02/23 0920 06/02/23 1218 06/02/23 1911 06/03/23 0533 06/04/23 0502  WBC 7.8  --   --   --  6.3  HGB 10.4*   < > 9.0* 8.7* 9.0*  HCT 32.8*   < > 28.8* 27.8* 29.0*  PLT 253  --   --   --  241   < > = values in this interval not displayed.   BMET Recent Labs    06/02/23 0920  NA 139  K 3.9  CL 108  CO2 22  GLUCOSE 127*  BUN 17  CREATININE 0.93  CALCIUM 8.8*   LFT Recent Labs    06/02/23 0920  PROT 6.2*  ALBUMIN 3.2*  AST 18  ALT 14  ALKPHOS 66  BILITOT 0.8   PT/INR No results for input(s): "LABPROT", "INR" in the last 72 hours. Hepatitis Panel No results for input(s): "HEPBSAG", "HCVAB", "HEPAIGM", "HEPBIGM" in the last 72 hours.  No results found.   Principal Problem:   BRBPR (bright red blood per rectum)     LOS: 0 days     Edman Circle, MD 06/04/2023, 9:52 AM [facility] [phone]

## 2023-06-04 NOTE — Progress Notes (Signed)
PROGRESS NOTE    Tabitha Green  ZOX:[id number] DOB: 10/14/1945 DOA: 06/02/2023 PCP: Jeoffrey Massed, MD   Brief Narrative:  HPI: Tabitha Green is a 77 y.o. female with medical history significant for relation diagnosed April 2024 on Eliquis 5 mg p.o. twice daily, known diverticulosis and internal hemorrhoids as well as GERD, FSGS status post kidney transplant being admitted to the hospital with acute blood loss anemia and bright red blood per rectum.  The history of some rectal bleeding in August of this year, she had colonoscopy with evidence of diverticula and internal hemorrhoids.  Developed some hematochezia on 10/14, since that time initially held her Eliquis had an office visit with Dr. Marina Goodell and he recommended that he hold her Eliquis until the bleeding stopped.  She restarted her Eliquis on 10/20 but over the last 2 to 3 days she has had recurrence of intermittent bright red blood per rectum mixed with maroon stools.  Today she had a large loose bowel movement that was maroon, felt dizzy and lightheaded when she was ambulating and so came to the ER for evaluation.  Denies any significant abdominal pain, rectal pain, or nausea.  No chest pain, palpitations, fevers, chills or vomiting.  In the emergency department vital signs are unremarkable.  Hemoglobin of 10.4, down from 13 at an outside facility about 10 days ago.  Hospitalist was contacted for observation admission, and patient has been seen by gastroenterology in the meantime.    Assessment & Plan:   Principal Problem:   BRBPR (bright red blood per rectum) Active Problems:   Hypothyroidism   Unspecified essential hypertension   PAF (paroxysmal atrial fibrillation) (HCC)   ABLA (acute blood loss anemia)  Acute blood loss anemia secondary to bright red blood per rectum secondary to Eliquis-likely diverticular bleed versus internal hemorrhoids, versus other.  Hemodynamically stable.  Per patient, she has had 2 episodes of rectal  bleeding in last 24 hours but they were small.  Hemoglobin however has improved somewhat.  Seen by GI, advance to soft diet.  They were going to start her on Eliquis and observe for 24 hours (Eliquis order not placed though).  Will order Eliquis per their recommendations.  Patient is aware of the plan.  Atrial fibrillation-currently in sinus rhythm, resuming Eliquis as mentioned above.   Hypertension-blood pressure controlled.  Continue home verapamil, with as needed IV hydralazine   History of FSGS and renal transplant-continue Myfortic, Prograf and oral bicarb, renal function is at baseline   Hypothyroidism-Synthroid   Anxiety-lorazepam 0.5 mg p.o. twice daily as needed.   DVT prophylaxis: SCDs Start: 06/02/23 1153   Code Status: Full Code  Family Communication:  None present at bedside.  Plan of care discussed with patient in length and he/she verbalized understanding and agreed with it.  Status is: Observation The patient will require care spanning > 2 midnights and should be moved to inpatient because: Needs another night of observation.   Estimated body mass index is 41.42 kg/m as calculated from the following:   Height as of this encounter: 5\' 5"  (1.651 m).   Weight as of this encounter: 112.9 kg.    Nutritional Assessment: Body mass index is 41.42 kg/m.[name] Seen by dietician.  I agree with the assessment and plan as outlined below: Nutrition Status:        . Skin Assessment: I have examined the patient's skin and I agree with the wound assessment as performed by the wound care RN as outlined below:  Consultants:  GI  Procedures:  None  Antimicrobials:  Anti-infectives (From admission, onward)    None         Subjective: Seen and examined.  No complaints at the moment.  2 small rectal bleeds in the last 24 hours.  Objective: Vitals:   06/03/23 2133 06/04/23 0452 06/04/23 0958 06/04/23 1202  BP: (!) 162/78 (!) 159/81 126/62 104/73  Pulse: 67 68  80   Resp: 20 20  20   Temp: 99 F (37.2 C) 97.6 F (36.4 C)  98.3 F (36.8 C)  TempSrc: Oral Oral  Oral  SpO2: 98% 99%  97%  Weight:      Height:        Intake/Output Summary (Last 24 hours) at 06/04/2023 1228 Last data filed at 06/04/2023 1000 Gross per 24 hour  Intake 480 ml  Output 1300 ml  Net -820 ml   Filed Weights   06/03/23 2000  Weight: 112.9 kg    Examination:  General exam: Appears calm and comfortable  Respiratory system: Clear to auscultation. Respiratory effort normal. Cardiovascular system: S1 & S2 heard, RRR. No JVD, murmurs, rubs, gallops or clicks. No pedal edema. Gastrointestinal system: Abdomen is nondistended, soft and nontender. No organomegaly or masses felt. Normal bowel sounds heard. Central nervous system: Alert and oriented. No focal neurological deficits. Extremities: Symmetric 5 x 5 power. Skin: No rashes, lesions or ulcers.  Psychiatry: Judgement and insight appear normal. Mood & affect appropriate.   Data Reviewed: I have personally reviewed following labs and imaging studies  CBC: Recent Labs  Lab 06/02/23 0920 06/02/23 1218 06/02/23 1911 06/03/23 0533 06/04/23 0502  WBC 7.8  --   --   --  6.3  NEUTROABS 5.4  --   --   --  3.4  HGB 10.4* 9.1* 9.0* 8.7* 9.0*  HCT 32.8* 28.6* 28.8* 27.8* 29.0*  MCV 92.7  --   --   --  93.2  PLT 253  --   --   --  241   Basic Metabolic Panel: Recent Labs  Lab 06/02/23 0920  NA 139  K 3.9  CL 108  CO2 22  GLUCOSE 127*  BUN 17  CREATININE 0.93  CALCIUM 8.8*   GFR: Estimated Creatinine Clearance: 63.5 mL/min (by C-G formula based on SCr of 0.93 mg/dL). Liver Function Tests: Recent Labs  Lab 06/02/23 0920  AST 18  ALT 14  ALKPHOS 66  BILITOT 0.8  PROT 6.2*  ALBUMIN 3.2*   No results for input(s): "LIPASE", "AMYLASE" in the last 168 hours. No results for input(s): "AMMONIA" in the last 168 hours. Coagulation Profile: No results for input(s): "INR", "PROTIME" in the last 168  hours. Cardiac Enzymes: No results for input(s): "CKTOTAL", "CKMB", "CKMBINDEX", "TROPONINI" in the last 168 hours. BNP (last 3 results) No results for input(s): "PROBNP" in the last 8760 hours. HbA1C: No results for input(s): "HGBA1C" in the last 72 hours. CBG: No results for input(s): "GLUCAP" in the last 168 hours. Lipid Profile: No results for input(s): "CHOL", "HDL", "LDLCALC", "TRIG", "CHOLHDL", "LDLDIRECT" in the last 72 hours. Thyroid Function Tests: No results for input(s): "TSH", "T4TOTAL", "FREET4", "T3FREE", "THYROIDAB" in the last 72 hours. Anemia Panel: No results for input(s): "VITAMINB12", "FOLATE", "FERRITIN", "TIBC", "IRON", "RETICCTPCT" in the last 72 hours. Sepsis Labs: No results for input(s): "PROCALCITON", "LATICACIDVEN" in the last 168 hours.  No results found for this or any previous visit (from the past 240 hour(s)).   Radiology Studies: No results found.  Scheduled Meds:  apixaban  5 mg Oral BID   doxazosin  2 mg Oral BID   levothyroxine  125 mcg Oral Once per day on Monday Tuesday Wednesday Thursday Friday Saturday   losartan  100 mg Oral Daily   magnesium chloride  2 tablet Oral Daily   mycophenolate  360 mg Oral BID   sodium bicarbonate  650 mg Oral BID   tacrolimus  2 mg Oral BID   verapamil  240 mg Oral q morning   Continuous Infusions:   LOS: 0 days   Hughie Closs, MD [facility]  06/04/2023, 12:28 PM   *Please note that this is a verbal dictation therefore any spelling or grammatical errors are due to the "Dragon Medical One" system interpretation.  Please page via Amion and do not message via secure chat for urgent patient care matters. Secure chat can be used for non urgent patient care matters.  How to contact the [facility] Attending or Consulting provider 7A - 7P or covering provider during after hours 7P -7A, for this patient?  Check the care team in [facility] and look for a) attending/consulting [facility] provider listed and b) the [facility] team  listed. Page or secure chat 7A-7P. Log into [email or web address] and use [place]'s universal password to access. If you do not have the password, please contact the hospital operator. Locate the [facility] provider you are looking for under [facility] and page to a number that you can be directly reached. If you still have difficulty reaching the provider, please page the [facility] (Director on Call) for the Hospitalists listed on amion for assistance.

## 2023-06-04 NOTE — Progress Notes (Signed)
Mobility Specialist - Progress Note  During mobility: 121 bpm HR, Post-mobility: 78 bpm HR,    06/04/23 1042  Mobility  Activity Ambulated independently in hallway;Ambulated independently to bathroom  Level of Assistance Standby assist, set-up cues, supervision of patient - no hands on  Assistive Device None  Distance Ambulated (ft) 200 ft  Range of Motion/Exercises Active  Activity Response Tolerated well  Mobility Referral Yes  $Mobility charge 1 Mobility  Mobility Specialist Start Time (ACUTE ONLY) 1030  Mobility Specialist Stop Time (ACUTE ONLY) 1042  Mobility Specialist Time Calculation (min) (ACUTE ONLY) 12 min   Pt was found in bed and agreeable to ambulate. No complaints with session. At EOS returned to recliner chair with all needs met. Call bell in reach and chair alarm on .  Billey Chang Mobility Specialist

## 2023-06-05 ENCOUNTER — Other Ambulatory Visit (HOSPITAL_COMMUNITY): Payer: Self-pay

## 2023-06-05 ENCOUNTER — Telehealth: Payer: Self-pay | Admitting: Pharmacy Technician

## 2023-06-05 DIAGNOSIS — K922 Gastrointestinal hemorrhage, unspecified: Secondary | ICD-10-CM | POA: Diagnosis not present

## 2023-06-05 DIAGNOSIS — K625 Hemorrhage of anus and rectum: Secondary | ICD-10-CM | POA: Diagnosis not present

## 2023-06-05 LAB — CBC WITH DIFFERENTIAL/PLATELET
Abs Immature Granulocytes: 0.03 10*3/uL (ref 0.00–0.07)
Abs Immature Granulocytes: 0.03 10*3/uL (ref 0.00–0.07)
Basophils Absolute: 0 10*3/uL (ref 0.0–0.1)
Basophils Absolute: 0 10*3/uL (ref 0.0–0.1)
Basophils Relative: 1 %
Basophils Relative: 1 %
Eosinophils Absolute: 0.5 10*3/uL (ref 0.0–0.5)
Eosinophils Absolute: 0.3 10*3/uL (ref 0.0–0.5)
Eosinophils Relative: 8 %
Eosinophils Relative: 5 %
HCT: 27.3 % — ABNORMAL LOW (ref 36.0–46.0)
HCT: 28.4 % — ABNORMAL LOW (ref 36.0–46.0)
Hemoglobin: 8.6 g/dL — ABNORMAL LOW (ref 12.0–15.0)
Hemoglobin: 9 g/dL — ABNORMAL LOW (ref 12.0–15.0)
Immature Granulocytes: 1 %
Immature Granulocytes: 0 %
Lymphocytes Relative: 35 %
Lymphocytes Relative: 23 %
Lymphs Abs: 2.1 10*3/uL (ref 0.7–4.0)
Lymphs Abs: 1.6 10*3/uL (ref 0.7–4.0)
MCH: 29.3 pg (ref 26.0–34.0)
MCH: 29.4 pg (ref 26.0–34.0)
MCHC: 31.5 g/dL (ref 30.0–36.0)
MCHC: 31.7 g/dL (ref 30.0–36.0)
MCV: 92.9 fL (ref 80.0–100.0)
MCV: 92.8 fL (ref 80.0–100.0)
Monocytes Absolute: 0.5 10*3/uL (ref 0.1–1.0)
Monocytes Absolute: 0.4 10*3/uL (ref 0.1–1.0)
Monocytes Relative: 8 %
Monocytes Relative: 6 %
Neutro Abs: 2.9 10*3/uL (ref 1.7–7.7)
Neutro Abs: 4.4 10*3/uL (ref 1.7–7.7)
Neutrophils Relative %: 47 %
Neutrophils Relative %: 65 %
Platelets: 247 10*3/uL (ref 150–400)
Platelets: 247 10*3/uL (ref 150–400)
RBC: 2.94 MIL/uL — ABNORMAL LOW (ref 3.87–5.11)
RBC: 3.06 MIL/uL — ABNORMAL LOW (ref 3.87–5.11)
RDW: 13.6 % (ref 11.5–15.5)
RDW: 13.4 % (ref 11.5–15.5)
WBC: 6 10*3/uL (ref 4.0–10.5)
WBC: 6.7 10*3/uL (ref 4.0–10.5)
nRBC: 0 % (ref 0.0–0.2)
nRBC: 0 % (ref 0.0–0.2)

## 2023-06-05 MED ORDER — VERAPAMIL HCL ER 120 MG PO TBCR
120.0000 mg | EXTENDED_RELEASE_TABLET | Freq: Every day | ORAL | Status: DC
  Administered 2023-06-05 – 2023-06-07 (×3): 120 mg via ORAL
  Filled 2023-06-05 (×4): qty 1

## 2023-06-05 NOTE — Plan of Care (Signed)
  Problem: Clinical Measurements: Goal: Ability to maintain clinical measurements within normal limits will improve Outcome: Progressing   Problem: Activity: Goal: Risk for activity intolerance will decrease Outcome: Progressing   Problem: Nutrition: Goal: Adequate nutrition will be maintained Outcome: Progressing   Problem: Coping: Goal: Level of anxiety will decrease Outcome: Progressing   Problem: Elimination: Goal: Will not experience complications related to urinary retention Outcome: Progressing   Problem: Pain Management: Goal: General experience of comfort will improve Outcome: Progressing

## 2023-06-05 NOTE — Progress Notes (Signed)
Mobility Specialist - Progress Note  Pre-mobility: 75 bpm HR, During mobility: 102 bpm HR, 50 RR Post-mobility: 61 bpm HR, 14 RR   06/05/23 1127  Mobility  Activity Ambulated independently in hallway;Ambulated independently to bathroom  Level of Assistance Independent  Assistive Device None  Distance Ambulated (ft) 200 ft  Range of Motion/Exercises Active  Activity Response Tolerated well  Mobility Referral Yes  $Mobility charge 1 Mobility  Mobility Specialist Start Time (ACUTE ONLY) 1117  Mobility Specialist Stop Time (ACUTE ONLY) 1127  Mobility Specialist Time Calculation (min) (ACUTE ONLY) 10 min   Pt was found in bed and agreeable to ambulate. No complaints with session. At EOS returned to use bathroom. Was left on recliner chair with all needs met. Call bell in reach and RN notified.  Billey Chang Mobility Specialist

## 2023-06-05 NOTE — Discharge Summary (Signed)
Physician Discharge Summary  RENIAH HILLMANN UVO:[id number] DOB: [birth year] DOA: 06/02/2023  PCP: Jeoffrey Massed, MD  Admit date: 06/02/2023 Discharge date: 06/05/2023    Admitted From: Home Disposition: Home  Recommendations for Outpatient Follow-up:  Follow up with PCP in 1-2 weeks Please obtain BMP/CBC in one week Follow-up with GI as they schedule you. Follow-up with your primary cardiologist in 1 to 2 weeks to discuss the necessity of Eliquis. Please follow up with your PCP on the following pending results: Unresulted Labs (From admission, onward)    None         Home Health: None Equipment/Devices: None  Discharge Condition: Stable CODE STATUS: Full code Diet recommendation: Cardiac  Subjective: Patient seen and examined, daughter at the bedside.  She had another episode of maroon/red blood per rectum this morning.  No other complaints.  Brief/Interim Summary:  Tabitha Green is a 77 y.o. female with medical history significant for relation diagnosed April 2024 on Eliquis 5 mg p.o. twice daily, known diverticulosis and internal hemorrhoids as well as GERD, FSGS status post kidney transplant admitted to the hospital with acute blood loss anemia and bright red blood per rectum.  The history of some rectal bleeding in August of this year, she had colonoscopy with evidence of diverticula and internal hemorrhoids.  Developed some hematochezia on 10/14, since that time initially held her Eliquis had an office visit with Dr. Marina Goodell and he recommended that he hold her Eliquis until the bleeding stopped.  She restarted her Eliquis on 10/20 but over the last 2 to 3 days she has had recurrence of intermittent bright red blood per rectum mixed with maroon stools.  On the day of sedation, she had a large loose bowel movement that was maroon, felt dizzy and lightheaded when she was ambulating and so came to the ER for evaluation.  No abdominal pain, rectal pain, or nausea.  No chest pain,  palpitations, fevers, chills or vomiting.  In the emergency department vital signs are stable.  Hemoglobin of 10.4, down from 13 at an outside facility about 10 days ago.  She was admitted under hospitalist service for acute blood loss anemia secondary to bright red blood per rectum secondary to Eliquis.  Of note, she also had a CTA of the abdomen on 05/22/2023 which was negative.  She had 3-4 episodes of small amount of rectal bleeding while in the hospital.  GI resumed her Eliquis on 06/04/2023.  She had another small episode this morning.  Interestingly, her hemoglobin has remained stable around 9 for last 3 days despite of those episodes.  Likely this is hemorrhoidal bleed.  GI has cleared her to discharge home with Eliquis.  They will follow-up with her as outpatient.  They have also recommended that she follows up with cardiology as outpatient to figure out if Eliquis is really needed and if there is any other alternative such as Watchman procedure to get her off of anticoagulation.  Patient has been informed of this by GI.   Atrial fibrillation-currently in sinus rhythm, resuming Eliquis as mentioned above.   Hypertension-blood pressure controlled.  Resume home medications.   History of FSGS and renal transplant-continue Myfortic, Prograf and oral bicarb, renal function is at baseline   Hypothyroidism-Synthroid   Anxiety-lorazepam 0.5 mg p.o. twice daily as needed.   Discharge plan was discussed with patient and/or family member and they verbalized understanding and agreed with it.  Discharge Diagnoses:  Principal Problem:   BRBPR (bright red blood per  rectum) Active Problems:   Hypothyroidism   Unspecified essential hypertension   PAF (paroxysmal atrial fibrillation) (HCC)   ABLA (acute blood loss anemia)    Discharge Instructions   Allergies as of 06/05/2023       Reactions   Labetalol    Patient couldn't walk, low BP   Ceclor [cefaclor] Rash   Naproxen Rash   Enalapril  Maleate Cough   Vasotec   Metoprolol Tartrate Rash   On legs        Medication List     TAKE these medications    acetaminophen 500 MG tablet Commonly known as: TYLENOL Take 1,000 mg by mouth every 8 (eight) hours as needed for moderate pain.   apixaban 5 MG Tabs tablet Commonly known as: ELIQUIS Take 1 tablet (5 mg total) by mouth 2 (two) times daily.   AYR SALINE NASAL DROPS NA Place 2 sprays into both nostrils daily.   B-12 PO Take 1 tablet by mouth daily.   doxazosin 2 MG tablet Commonly known as: CARDURA TAKE 1 TABLET(2 MG) BY MOUTH TWICE DAILY What changed: See the new instructions.   hydrocortisone 2.5 % rectal cream Commonly known as: ANUSOL-HC Place 1 Application rectally 2 (two) times daily.   hydrocortisone 25 MG suppository Commonly known as: ANUSOL-HC Place 1 suppository (25 mg total) rectally every 12 (twelve) hours.   levothyroxine 125 MCG tablet Commonly known as: SYNTHROID 1/2 tab po on two days a week, whole tab all other days What changed:  how much to take how to take this when to take this additional instructions   LORazepam 0.5 MG tablet Commonly known as: ATIVAN Take 1 tablet (0.5 mg total) by mouth 2 (two) times daily as needed for anxiety.   losartan 100 MG tablet Commonly known as: COZAAR TAKE 1 TABLET BY MOUTH DAILY   magnesium chloride 64 MG Tbec SR tablet Commonly known as: SLOW-MAG Take 2 tablets (128 mg total) by mouth daily.   mycophenolate 360 MG Tbec EC tablet Commonly known as: MYFORTIC Take 360 mg by mouth 2 (two) times daily.   nystatin powder Apply 1 Application topically once a week.   silver sulfADIAZINE 1 % cream Commonly known as: SILVADENE Apply 1 Application topically daily.   sodium bicarbonate 650 MG tablet Take 1 tablet (650 mg total) by mouth 2 (two) times daily. Takes 1 in AM and 1 at bedtime   tacrolimus 1 MG capsule Commonly known as: PROGRAF Take 2 mg by mouth 2 (two) times daily.    triamcinolone cream 0.1 % Commonly known as: KENALOG Apply 1 application topically daily.   verapamil 120 MG CR tablet Commonly known as: CALAN-SR TAKE 1 TABLET BY MOUTH EVERY  AFTERNOON What changed: See the new instructions.   verapamil 240 MG CR tablet Commonly known as: CALAN-SR TAKE 1 TABLET BY MOUTH EVERY  MORNING What changed: Another medication with the same name was changed. Make sure you understand how and when to take each.   VICKS VAPORUB EX Apply 1 application topically daily.   Vitamin D (Ergocalciferol) 1.25 MG (50000 UNIT) Caps capsule Commonly known as: DRISDOL Take 1 capsule (50,000 Units total) by mouth 2 (two) times a week.        Follow-up Information     McGowen, Maryjean Morn, MD Follow up in 1 week(s).   Specialty: Family Medicine Contact information: [address] [place] [address] [phone]  Allergies  Allergen Reactions   Labetalol     Patient couldn't walk, low BP   Ceclor [Cefaclor] Rash   Naproxen Rash   Enalapril Maleate Cough    Vasotec   Metoprolol Tartrate Rash    On legs    Consultations: GI   Procedures/Studies: No results found.   Discharge Exam: Vitals:   06/05/23 0518 06/05/23 1247  BP: 132/81 138/69  Pulse: 62 61  Resp: 18   Temp: 98.6 F (37 C) 98.2 F (36.8 C)  SpO2: 98% 99%   Vitals:   06/04/23 1202 06/04/23 2058 06/05/23 0518 06/05/23 1247  BP: 104/73 (!) 146/68 132/81 138/69  Pulse: 80 62 62 61  Resp: 20 18 18    Temp: 98.3 F (36.8 C) 97.9 F (36.6 C) 98.6 F (37 C) 98.2 F (36.8 C)  TempSrc: Oral Oral Oral Oral  SpO2: 97% 97% 98% 99%  Weight:      Height:        General: Pt is alert, awake, not in acute distress Cardiovascular: RRR, S1/S2 +, no rubs, no gallops Respiratory: CTA bilaterally, no wheezing, no rhonchi Abdominal: Soft, NT, ND, bowel sounds + Extremities: no edema, no cyanosis    The results of significant diagnostics from this  hospitalization (including imaging, microbiology, ancillary and laboratory) are listed below for reference.     Microbiology: No results found for this or any previous visit (from the past 240 hour(s)).   Labs: BNP (last 3 results) Recent Labs    11/20/22 1317  BNP 217.0*   Basic Metabolic Panel: Recent Labs  Lab 06/02/23 0920  NA 139  K 3.9  CL 108  CO2 22  GLUCOSE 127*  BUN 17  CREATININE 0.93  CALCIUM 8.8*   Liver Function Tests: Recent Labs  Lab 06/02/23 0920  AST 18  ALT 14  ALKPHOS 66  BILITOT 0.8  PROT 6.2*  ALBUMIN 3.2*   No results for input(s): "LIPASE", "AMYLASE" in the last 168 hours. No results for input(s): "AMMONIA" in the last 168 hours. CBC: Recent Labs  Lab 06/02/23 0920 06/02/23 1218 06/02/23 1911 06/03/23 0533 06/04/23 0502 06/05/23 0452 06/05/23 1208  WBC 7.8  --   --   --  6.3 6.0 6.7  NEUTROABS 5.4  --   --   --  3.4 2.9 4.4  HGB 10.4*   < > 9.0* 8.7* 9.0* 8.6* 9.0*  HCT 32.8*   < > 28.8* 27.8* 29.0* 27.3* 28.4*  MCV 92.7  --   --   --  93.2 92.9 92.8  PLT 253  --   --   --  241 247 247   < > = values in this interval not displayed.   Cardiac Enzymes: No results for input(s): "CKTOTAL", "CKMB", "CKMBINDEX", "TROPONINI" in the last 168 hours. BNP: Invalid input(s): "POCBNP" CBG: No results for input(s): "GLUCAP" in the last 168 hours. D-Dimer No results for input(s): "DDIMER" in the last 72 hours. Hgb A1c No results for input(s): "HGBA1C" in the last 72 hours. Lipid Profile No results for input(s): "CHOL", "HDL", "LDLCALC", "TRIG", "CHOLHDL", "LDLDIRECT" in the last 72 hours. Thyroid function studies No results for input(s): "TSH", "T4TOTAL", "T3FREE", "THYROIDAB" in the last 72 hours.  Invalid input(s): "FREET3" Anemia work up No results for input(s): "VITAMINB12", "FOLATE", "FERRITIN", "TIBC", "IRON", "RETICCTPCT" in the last 72 hours. Urinalysis    Component Value Date/Time   COLORURINE STRAW (A) 11/20/2022 1435    APPEARANCEUR CLEAR 11/20/2022 1435   LABSPEC 1.006  11/20/2022 1435   PHURINE 7.0 11/20/2022 1435   GLUCOSEU NEGATIVE 11/20/2022 1435   GLUCOSEU NEGATIVE 07/07/2015 1152   HGBUR NEGATIVE 11/20/2022 1435   BILIRUBINUR NEGATIVE 11/20/2022 1435   BILIRUBINUR Negative 10/26/2022 1405   KETONESUR NEGATIVE 11/20/2022 1435   PROTEINUR NEGATIVE 11/20/2022 1435   UROBILINOGEN 1.0 10/26/2022 1405   UROBILINOGEN 0.2 07/07/2015 1152   NITRITE NEGATIVE 11/20/2022 1435   LEUKOCYTESUR NEGATIVE 11/20/2022 1435   Sepsis Labs Recent Labs  Lab 06/02/23 0920 06/04/23 0502 06/05/23 0452 06/05/23 1208  WBC 7.8 6.3 6.0 6.7   Microbiology No results found for this or any previous visit (from the past 240 hour(s)).  FURTHER DISCHARGE INSTRUCTIONS:   Get Medicines reviewed and adjusted: Please take all your medications with you for your next visit with your Primary MD   Laboratory/radiological data: Please request your Primary MD to go over all hospital tests and procedure/radiological results at the follow up, please ask your Primary MD to get all Hospital records sent to his/her office.   In some cases, they will be blood work, cultures and biopsy results pending at the time of your discharge. Please request that your primary care M.D. goes through all the records of your hospital data and follows up on these results.   Also Note the following: If you experience worsening of your admission symptoms, develop shortness of breath, life threatening emergency, suicidal or homicidal thoughts you must seek medical attention immediately by calling 911 or calling your MD immediately  if symptoms less severe.   You must read complete instructions/literature along with all the possible adverse reactions/side effects for all the Medicines you take and that have been prescribed to you. Take any new Medicines after you have completely understood and accpet all the possible adverse reactions/side effects.    Do  not drive when taking Pain medications or sleeping medications (Benzodaizepines)   Do not take more than prescribed Pain, Sleep and Anxiety Medications. It is not advisable to combine anxiety,sleep and pain medications without talking with your primary care practitioner   Special Instructions: If you have smoked or chewed Tobacco  in the last 2 yrs please stop smoking, stop any regular Alcohol  and or any Recreational drug use.   Wear Seat belts while driving.   Please note: You were cared for by a hospitalist during your hospital stay. Once you are discharged, your primary care physician will handle any further medical issues. Please note that NO REFILLS for any discharge medications will be authorized once you are discharged, as it is imperative that you return to your primary care physician (or establish a relationship with a primary care physician if you do not have one) for your post hospital discharge needs so that they can reassess your need for medications and monitor your lab values  Time coordinating discharge: Over 30 minutes  SIGNED:   Hughie Closs, MD  [facility] 06/05/2023, 1:18 PM *Please note that this is a verbal dictation therefore any spelling or grammatical errors are due to the "Dragon Medical One" system interpretation. If 7PM-7AM, please contact night-coverage [email or web address]

## 2023-06-05 NOTE — Progress Notes (Addendum)
Progress Note  Primary GI: Dr. Marina Goodell  LOS: 0 days   Chief Complaint: Rectal bleeding while on Eliquis, acute anemia   Subjective   Pt lying in bed, reports she had stool in toilet with blood this am. Took a look and brown slightly maroon stool with bright red blood in toilet. Her first BM in last few days. Overall, she states she feels good. No CP or SOB. Reports her seasonal allergies giving her some mild dizziness. Tolerating soft diet, no nausea vomiting, or abdominal pain.  No family was present at the time of my evaluation.   Objective   Vital signs in last 24 hours: Temp:  [97.9 F (36.6 C)-98.6 F (37 C)] 98.6 F (37 C) (10/28 0518) Pulse Rate:  [62-80] 62 (10/28 0518) Resp:  [18-20] 18 (10/28 0518) BP: (104-146)/(62-81) 132/81 (10/28 0518) SpO2:  [97 %-98 %] 98 % (10/28 0518) Last BM Date : 06/04/23 Last BM recorded by nurses in past 5 days Stool Type: Type 5 (Soft blobs with clear-cut edges) (06/04/2023  7:00 AM)  General:   female in no acute distress  Heart:  Regular rate and rhythm; no murmurs Pulm: Clear anteriorly; no wheezing Abdomen: soft, nondistended, normal bowel sounds in all quadrants. Nontender without guarding. No organomegaly appreciated. Extremities:  No edema Neurologic:  Alert and  oriented x4;  No focal deficits.  Psych:  Cooperative. Normal mood and affect.  Intake/Output from previous day: 10/27 0701 - 10/28 0700 In: 600 [P.O.:600] Out: 100 [Urine:100] Intake/Output this shift: No intake/output data recorded.  Studies/Results: No results found.  Lab Results: Recent Labs    06/02/23 0920 06/02/23 1218 06/03/23 0533 06/04/23 0502 06/05/23 0452  WBC 7.8  --   --  6.3 6.0  HGB 10.4*   < > 8.7* 9.0* 8.6*  HCT 32.8*   < > 27.8* 29.0* 27.3*  PLT 253  --   --  241 247   < > = values in this interval not displayed.   BMET Recent Labs    06/02/23 0920  NA 139  K 3.9  CL 108  CO2 22  GLUCOSE 127*  BUN 17  CREATININE 0.93   CALCIUM 8.8*   LFT Recent Labs    06/02/23 0920  PROT 6.2*  ALBUMIN 3.2*  AST 18  ALT 14  ALKPHOS 66  BILITOT 0.8   PT/INR No results for input(s): "LABPROT", "INR" in the last 72 hours.   Scheduled Meds:  apixaban  5 mg Oral BID   doxazosin  2 mg Oral BID   hydrocortisone   Rectal BID   levothyroxine  125 mcg Oral Once per day on Monday Tuesday Wednesday Thursday Friday Saturday   losartan  100 mg Oral Daily   magnesium chloride  2 tablet Oral Daily   mycophenolate  360 mg Oral BID   sodium bicarbonate  650 mg Oral BID   tacrolimus  2 mg Oral BID   verapamil  240 mg Oral q morning   Continuous Infusions:    Patient Narrative:  Tabitha Green is a 77 y.o. female with past medical history significant for end-stage renal disease was on dialysis since 2014 status post renal cell transplant 2017, CMV infection, cholelithiasis, diverticulosis, fatty liver, GERD, B12 deficiency, atrial fibrillation diagnosed 11/2022 on Eliquis 5 mg twice daily. Presents to the ER 10/25 with rectal bleeding and dizziness.    Impression/Plan:   Diverticular bleed after restarting Eliquis (on 10/14). Held until yesterday PM, received first  dose. Hgb 13 -->10.4--->9.0---> 8.6. HD stable. Neg CTA 10/14. Brown/maroon stool this AM with BRB in toilet. --Monitor H&H, consider transfusion if Hgb <7 --Will monitor patient closely after AM dose of Eliquis and recheck CBC at noon.  --Recommend FU with cardiology as outpt (need for eliquis or any other alternatives like watchman's procedure to get her off [facility])   Significant colon polyp s/p recent polypectomy 03/10/2023 (Bx-tubular adenoma).  Also had pancolonic diverticulosis.  -Recall colon 03/2026 (Dr Marina Goodell).  Hemorrhoids, using topical cream. Reinforced using at home. Suppositories too expensive.   A-fib on Eliquis.  Currently in NSR.  Principal Problem:   BRBPR (bright red blood per rectum) Active Problems:   Hypothyroidism   Unspecified essential  hypertension   PAF (paroxysmal atrial fibrillation) (HCC)   ABLA (acute blood loss anemia)   Tabitha Green  06/05/2023, 9:02 AM     [facility] Attending   I have taken an interval history, reviewed the chart and examined the patient. I agree with the Advanced Practitioner's note, impression and recommendations with the following additions:  Husband was present when we rounded in the afternoon.  Rectal exam revealed maroon stool (performed in presence of [name]).  It appears that she is having persistent diverticular bleeding.  It resumed when she restarted Eliquis yesterday.  Plan is to stop Eliquis.  I would not restart at this hospitalization.  Will continue to monitor and when she gets 24 to 48 hours without bleeding would consider discharge.  I do think this is a diverticular hemorrhage but if she continues to have persistent bleeding we Green need to perform a colonoscopy.  She just had a colonoscopy in August and no AVMs were seen but it is possible colonic AVM could be causing bleeding and if bleeding fails to resolve that would be something to look for.  Depending upon hemoglobin level she Green need transfusion do not think so now but hemoglobin is continued to decline.  Iva Boop, MD, [facility] See Loretha Stapler on call - gastroenterology for best contact person 06/05/2023 4:27 PM

## 2023-06-05 NOTE — Telephone Encounter (Signed)
Pharmacy Patient Advocate Encounter   Received notification from Patient Pharmacy that prior authorization for [facility] 25MG  SUPP is required/requested.   Insurance verification completed.   The patient is insured through  [facility] MEDICARE PART D  .   Per test claim:  Product/Service Not Covered. Not Covered Under Part D Law

## 2023-06-06 ENCOUNTER — Other Ambulatory Visit: Payer: Self-pay

## 2023-06-06 DIAGNOSIS — K625 Hemorrhage of anus and rectum: Secondary | ICD-10-CM | POA: Diagnosis not present

## 2023-06-06 DIAGNOSIS — K5731 Diverticulosis of large intestine without perforation or abscess with bleeding: Secondary | ICD-10-CM | POA: Diagnosis not present

## 2023-06-06 DIAGNOSIS — R195 Other fecal abnormalities: Secondary | ICD-10-CM

## 2023-06-06 DIAGNOSIS — K922 Gastrointestinal hemorrhage, unspecified: Secondary | ICD-10-CM | POA: Diagnosis not present

## 2023-06-06 LAB — CBC WITH DIFFERENTIAL/PLATELET
Abs Immature Granulocytes: 0.02 10*3/uL (ref 0.00–0.07)
Basophils Absolute: 0 10*3/uL (ref 0.0–0.1)
Basophils Relative: 0 %
Eosinophils Absolute: 0.3 10*3/uL (ref 0.0–0.5)
Eosinophils Relative: 5 %
HCT: 28.3 % — ABNORMAL LOW (ref 36.0–46.0)
Hemoglobin: 9 g/dL — ABNORMAL LOW (ref 12.0–15.0)
Immature Granulocytes: 0 %
Lymphocytes Relative: 31 %
Lymphs Abs: 1.8 10*3/uL (ref 0.7–4.0)
MCH: 29.3 pg (ref 26.0–34.0)
MCHC: 31.8 g/dL (ref 30.0–36.0)
MCV: 92.2 fL (ref 80.0–100.0)
Monocytes Absolute: 0.4 10*3/uL (ref 0.1–1.0)
Monocytes Relative: 8 %
Neutro Abs: 3.3 10*3/uL (ref 1.7–7.7)
Neutrophils Relative %: 56 %
Platelets: 260 10*3/uL (ref 150–400)
RBC: 3.07 MIL/uL — ABNORMAL LOW (ref 3.87–5.11)
RDW: 13.6 % (ref 11.5–15.5)
WBC: 5.9 10*3/uL (ref 4.0–10.5)
nRBC: 0 % (ref 0.0–0.2)

## 2023-06-06 MED ORDER — SALINE SPRAY 0.65 % NA SOLN
1.0000 | NASAL | Status: DC | PRN
  Filled 2023-06-06: qty 44

## 2023-06-06 NOTE — Plan of Care (Signed)
  Problem: Education: Goal: Knowledge of General Education information will improve Description: Including pain rating scale, medication(s)/side effects and non-pharmacologic comfort measures Outcome: Progressing   Problem: Activity: Goal: Risk for activity intolerance will decrease Outcome: Progressing   Problem: Coping: Goal: Level of anxiety will decrease Outcome: Progressing   Problem: Pain Management: Goal: General experience of comfort will improve Outcome: Progressing   Problem: Safety: Goal: Ability to remain free from injury will improve Outcome: Progressing   Problem: Skin Integrity: Goal: Risk for impaired skin integrity will decrease Outcome: Progressing

## 2023-06-06 NOTE — Progress Notes (Addendum)
Progress Note  Primary GI: Dr. Marina Goodell  LOS: 0 days   Chief Complaint: Rectal bleeding while on Eliquis, acute anemia   Subjective   Pt lying in bed, reports one brown BM this am with bright red blood in toilet, small volume. She denies SOB, CP, or dizziness. Tolerating soft diet, no nausea, vomiting, or abdominal pain.    Objective   Vital signs in last 24 hours: Temp:  [97.9 F (36.6 C)-98.2 F (36.8 C)] 97.9 F (36.6 C) (10/28 2105) Pulse Rate:  [60-63] 63 (10/29 0514) Resp:  [12-13] 12 (10/29 0514) BP: (118-141)/(69-73) 141/73 (10/29 0514) SpO2:  [97 %-99 %] 97 % (10/29 0514) Last BM Date : 06/05/23 Last BM recorded by nurses in past 5 days Stool Type: Type 4 (Like a smooth, soft sausage or snake) (06/05/2023  3:00 PM)  General:   female in no acute distress  Heart:  Regular rate and rhythm; no murmurs Pulm: Clear anteriorly; no wheezing Abdomen: soft, obese, nondistended, normal bowel sounds in all quadrants. Nontender without guarding. No organomegaly appreciated. Extremities:  No edema Neurologic:  Alert and  oriented x4;  No focal deficits.  Psych:  Cooperative. Normal mood and affect.  Intake/Output from previous day: 10/28 0701 - 10/29 0700 In: 600 [P.O.:600] Out: -  Intake/Output this shift: No intake/output data recorded.  Studies/Results: No results found.  Lab Results: Recent Labs    06/04/23 0502 06/05/23 0452 06/05/23 1208  WBC 6.3 6.0 6.7  HGB 9.0* 8.6* 9.0*  HCT 29.0* 27.3* 28.4*  PLT 241 247 247   BMET No results for input(s): "NA", "K", "CL", "CO2", "GLUCOSE", "BUN", "CREATININE", "CALCIUM" in the last 72 hours.  LFT No results for input(s): "PROT", "ALBUMIN", "AST", "ALT", "ALKPHOS", "BILITOT", "BILIDIR", "IBILI" in the last 72 hours.  PT/INR No results for input(s): "LABPROT", "INR" in the last 72 hours.   Scheduled Meds:  doxazosin  2 mg Oral BID   hydrocortisone   Rectal BID   levothyroxine  125 mcg Oral Once per day on  Monday Tuesday Wednesday Thursday Friday Saturday   losartan  100 mg Oral Daily   magnesium chloride  2 tablet Oral Daily   mycophenolate  360 mg Oral BID   sodium bicarbonate  650 mg Oral BID   tacrolimus  2 mg Oral BID   verapamil  120 mg Oral Q1500   verapamil  240 mg Oral q morning   Continuous Infusions:    Patient Narrative:  Tabitha Green is a 77 y.o. female with past medical history significant for end-stage renal disease was on dialysis since 2014 status post renal cell transplant 2017, CMV infection, cholelithiasis, diverticulosis, fatty liver, GERD, B12 deficiency, atrial fibrillation diagnosed 11/2022 on Eliquis 5 mg twice daily. Presents to the ER 10/25 with rectal bleeding and dizziness.    Impression/Plan:  Diverticular bleed after restarting Eliquis (on 10/14). Held until 10/27, then received first dose on 10/27 PM and then AM dose 10/28. Pt had 2 bloody bowel movements. Eliquis held after AM dose on 10/28. Hgb 13 -->10.4--->9.0---> 8.6-->9.0-->9.0 . HD stable. Neg CTA 10/14.  --Monitor H&H, consider transfusion if Hgb <7 --Can consider discharge after no bleeding 24-48 hours. If persistent bleeding may consider performing a colonoscopy to r/o AVM's. --Recommend FU with cardiology as outpt (need for eliquis or any other alternatives like watchman's procedure to get her off [facility])   Significant colon polyp s/p recent polypectomy 03/10/2023 (Bx-tubular adenoma).  Also had pancolonic diverticulosis.  -Recall colon  03/2026 (Dr Marina Goodell).  Hemorrhoids, using topical cream. Reinforced using at home. Suppositories too expensive.   A-fib (on Eliquis)  Currently in NSR. -being held    Deanna J May  06/06/2023, 8:31 AM    [facility] Attending   I have taken an interval history, reviewed the chart and examined the patient. I agree with the Advanced Practitioner's note, impression and recommendations with the following additions:  Hemoglobin  stable.  It appears that her  diverticular bleed is slowing.  My recommendations and plans are that if she is without significant bleeding tomorrow (tiny amounts okay) with discharge and restart Eliquis 5 days after discharge.  We anticipate scheduling a CBC at our office 1 week after discharge and Dr. Marina Goodell will review data and make recommendations about follow-up at that time.  Iva Boop, MD, [facility] See Loretha Stapler on call - gastroenterology for best contact person 06/06/2023 4:02 PM

## 2023-06-06 NOTE — Plan of Care (Signed)
°  Problem: Clinical Measurements: °Goal: Respiratory complications will improve °Outcome: Progressing °Goal: Cardiovascular complication will be avoided °Outcome: Progressing °  °Problem: Activity: °Goal: Risk for activity intolerance will decrease °Outcome: Progressing °  °

## 2023-06-06 NOTE — Progress Notes (Addendum)
PROGRESS NOTE    Tabitha Green  KGM:[id number] DOB: Apr 02, 1946 DOA: 06/02/2023 PCP: Jeoffrey Massed, MD   Brief Narrative:  Tabitha Green is a 77 y.o. female with medical history significant for A-fib diagnosed April 2024 on Eliquis 5 mg p.o. twice daily, known diverticulosis and internal hemorrhoids as well as GERD, FSGS status post kidney transplant admitted to the hospital with acute blood loss anemia and bright red blood per rectum.  The history of some rectal bleeding in August of this year, she had colonoscopy with evidence of diverticula and internal hemorrhoids.  Developed some hematochezia on 10/14, since that time initially held her Eliquis had an office visit with Dr. Marina Goodell and he recommended that he hold her Eliquis until the bleeding stopped.  She restarted her Eliquis on 10/20 but over the last 2 to 3 days she has had recurrence of intermittent bright red blood per rectum mixed with maroon stools.  Today she had a large loose bowel movement that was maroon, felt dizzy and lightheaded when she was ambulating and so came to the ER for evaluation.  Further details below.    Assessment & Plan:   Principal Problem:   Lower GI bleed Active Problems:   Hypothyroidism   Unspecified essential hypertension   PAF (paroxysmal atrial fibrillation) (HCC)   ABLA (acute blood loss anemia)  Acute blood loss anemia secondary to bright red blood per rectum secondary to Eliquis-likely diverticular bleed versus internal hemorrhoids. Hemodynamically stable. Seen by GI, advanced to soft diet.  Eliquis held until 10/27, then received first dose on 10/27 PM and then AM dose 10/28. Pt had 2 bloody bowel movements. Eliquis held after AM dose on 10/28.  GI recommended keeping her in the hospital for 48 hours for observation.  Despite of witnessed rectal bleeds, interestingly her hemoglobin has remained stable around 9.  GI following and managing and we appreciate their help.  Atrial  fibrillation-currently in sinus rhythm, Eliquis on hold as mentioned above.   Hypertension-blood pressure controlled.  Continue home verapamil, with as needed IV hydralazine   History of FSGS and renal transplant-continue Myfortic, Prograf and oral bicarb, renal function is at baseline   Hypothyroidism-Synthroid   Anxiety-lorazepam 0.5 mg p.o. twice daily as needed.   DVT prophylaxis: SCDs Start: 06/02/23 1153   Code Status: Full Code  Family Communication:  None present at bedside.  Plan of care discussed with patient in length and he/she verbalized understanding and agreed with it.  Status is: Observation The patient will require care spanning > 2 midnights and should be moved to inpatient because: Needs another night of observation.   Estimated body mass index is 41.42 kg/m as calculated from the following:   Height as of this encounter: 5\' 5"  (1.651 m).   Weight as of this encounter: 112.9 kg.    Nutritional Assessment: Body mass index is 41.42 kg/m.[name] Seen by dietician.  I agree with the assessment and plan as outlined below: Nutrition Status:        . Skin Assessment: I have examined the patient's skin and I agree with the wound assessment as performed by the wound care RN as outlined below:    Consultants:  GI  Procedures:  None  Antimicrobials:  Anti-infectives (From admission, onward)    None         Subjective: Patient seen and examined.  She says that she had 1 bowel movement since 5 PM last evening but she is not sure if there was  any blood in it.  No other complaint.  Objective: Vitals:   06/05/23 0518 06/05/23 1247 06/05/23 2105 06/06/23 0514  BP: 132/81 138/69 118/72 (!) 141/73  Pulse: 62 61 60 63  Resp: 18  13 12   Temp: 98.6 F (37 C) 98.2 F (36.8 C) 97.9 F (36.6 C)   TempSrc: Oral Oral Oral   SpO2: 98% 99% 98% 97%  Weight:      Height:        Intake/Output Summary (Last 24 hours) at 06/06/2023 1132 Last data filed at  06/05/2023 1500 Gross per 24 hour  Intake 240 ml  Output --  Net 240 ml   Filed Weights   06/03/23 2000  Weight: 112.9 kg    Examination:  General exam: Appears calm and comfortable  Respiratory system: Clear to auscultation. Respiratory effort normal. Cardiovascular system: S1 & S2 heard, RRR. No JVD, murmurs, rubs, gallops or clicks. No pedal edema. Gastrointestinal system: Abdomen is nondistended, soft and nontender. No organomegaly or masses felt. Normal bowel sounds heard. Central nervous system: Alert and oriented. No focal neurological deficits. Extremities: Symmetric 5 x 5 power. Skin: No rashes, lesions or ulcers.  Psychiatry: Judgement and insight appear normal. Mood & affect appropriate.   Data Reviewed: I have personally reviewed following labs and imaging studies  CBC: Recent Labs  Lab 06/02/23 0920 06/02/23 1218 06/03/23 0533 06/04/23 0502 06/05/23 0452 06/05/23 1208 06/06/23 0855  WBC 7.8  --   --  6.3 6.0 6.7 5.9  NEUTROABS 5.4  --   --  3.4 2.9 4.4 3.3  HGB 10.4*   < > 8.7* 9.0* 8.6* 9.0* 9.0*  HCT 32.8*   < > 27.8* 29.0* 27.3* 28.4* 28.3*  MCV 92.7  --   --  93.2 92.9 92.8 92.2  PLT 253  --   --  241 247 247 260   < > = values in this interval not displayed.   Basic Metabolic Panel: Recent Labs  Lab 06/02/23 0920  NA 139  K 3.9  CL 108  CO2 22  GLUCOSE 127*  BUN 17  CREATININE 0.93  CALCIUM 8.8*   GFR: Estimated Creatinine Clearance: 63.5 mL/min (by C-G formula based on SCr of 0.93 mg/dL). Liver Function Tests: Recent Labs  Lab 06/02/23 0920  AST 18  ALT 14  ALKPHOS 66  BILITOT 0.8  PROT 6.2*  ALBUMIN 3.2*   No results for input(s): "LIPASE", "AMYLASE" in the last 168 hours. No results for input(s): "AMMONIA" in the last 168 hours. Coagulation Profile: No results for input(s): "INR", "PROTIME" in the last 168 hours. Cardiac Enzymes: No results for input(s): "CKTOTAL", "CKMB", "CKMBINDEX", "TROPONINI" in the last 168  hours. BNP (last 3 results) No results for input(s): "PROBNP" in the last 8760 hours. HbA1C: No results for input(s): "HGBA1C" in the last 72 hours. CBG: No results for input(s): "GLUCAP" in the last 168 hours. Lipid Profile: No results for input(s): "CHOL", "HDL", "LDLCALC", "TRIG", "CHOLHDL", "LDLDIRECT" in the last 72 hours. Thyroid Function Tests: No results for input(s): "TSH", "T4TOTAL", "FREET4", "T3FREE", "THYROIDAB" in the last 72 hours. Anemia Panel: No results for input(s): "VITAMINB12", "FOLATE", "FERRITIN", "TIBC", "IRON", "RETICCTPCT" in the last 72 hours. Sepsis Labs: No results for input(s): "PROCALCITON", "LATICACIDVEN" in the last 168 hours.  No results found for this or any previous visit (from the past 240 hour(s)).   Radiology Studies: No results found.  Scheduled Meds:  doxazosin  2 mg Oral BID   hydrocortisone   Rectal  BID   levothyroxine  125 mcg Oral Once per day on Monday Tuesday Wednesday Thursday Friday Saturday   losartan  100 mg Oral Daily   magnesium chloride  2 tablet Oral Daily   mycophenolate  360 mg Oral BID   sodium bicarbonate  650 mg Oral BID   tacrolimus  2 mg Oral BID   verapamil  120 mg Oral Q1500   verapamil  240 mg Oral q morning   Continuous Infusions:   LOS: 0 days   Hughie Closs, MD [facility]  06/06/2023, 11:32 AM   *Please note that this is a verbal dictation therefore any spelling or grammatical errors are due to the "[employer] One" system interpretation.  Please page via Amion and do not message via secure chat for urgent patient care matters. Secure chat can be used for non urgent patient care matters.  How to contact the [facility] Attending or Consulting provider 7A - 7P or covering provider during after hours 7P -7A, for this patient?  Check the care team in [facility] and look for a) attending/consulting TR[facility] provider listed and b) the [facility] team listed. Page or secure chat 7A-7P. Log into [email or web address] and use [facility]'s universal password to access. If you do not have the password, please contact the hospital operator. Locate the [facility] provider you are looking for under [facility] and page to a number that you can be directly reached. If you still have difficulty reaching the provider, please page the [facility] (Director on Call) for the Hospitalists listed on amion for assistance.

## 2023-06-07 ENCOUNTER — Telehealth: Payer: Self-pay

## 2023-06-07 ENCOUNTER — Other Ambulatory Visit: Payer: Self-pay | Admitting: Family Medicine

## 2023-06-07 DIAGNOSIS — K5731 Diverticulosis of large intestine without perforation or abscess with bleeding: Secondary | ICD-10-CM | POA: Diagnosis not present

## 2023-06-07 DIAGNOSIS — K625 Hemorrhage of anus and rectum: Secondary | ICD-10-CM | POA: Diagnosis not present

## 2023-06-07 DIAGNOSIS — K922 Gastrointestinal hemorrhage, unspecified: Secondary | ICD-10-CM | POA: Diagnosis not present

## 2023-06-07 LAB — CBC WITH DIFFERENTIAL/PLATELET
Abs Immature Granulocytes: 0.02 10*3/uL (ref 0.00–0.07)
Basophils Absolute: 0 10*3/uL (ref 0.0–0.1)
Basophils Relative: 1 %
Eosinophils Absolute: 0.4 10*3/uL (ref 0.0–0.5)
Eosinophils Relative: 7 %
HCT: 27.6 % — ABNORMAL LOW (ref 36.0–46.0)
Hemoglobin: 8.6 g/dL — ABNORMAL LOW (ref 12.0–15.0)
Immature Granulocytes: 0 %
Lymphocytes Relative: 34 %
Lymphs Abs: 2 10*3/uL (ref 0.7–4.0)
MCH: 28.8 pg (ref 26.0–34.0)
MCHC: 31.2 g/dL (ref 30.0–36.0)
MCV: 92.3 fL (ref 80.0–100.0)
Monocytes Absolute: 0.5 10*3/uL (ref 0.1–1.0)
Monocytes Relative: 9 %
Neutro Abs: 2.8 10*3/uL (ref 1.7–7.7)
Neutrophils Relative %: 49 %
Platelets: 248 10*3/uL (ref 150–400)
RBC: 2.99 MIL/uL — ABNORMAL LOW (ref 3.87–5.11)
RDW: 13.5 % (ref 11.5–15.5)
WBC: 5.8 10*3/uL (ref 4.0–10.5)
nRBC: 0 % (ref 0.0–0.2)

## 2023-06-07 MED ORDER — SILVER SULFADIAZINE 1 % EX CREA
TOPICAL_CREAM | Freq: Two times a day (BID) | CUTANEOUS | Status: DC
  Filled 2023-06-07: qty 50

## 2023-06-07 NOTE — Telephone Encounter (Signed)
-----   Message from Margarite Gouge May sent at 06/06/2023  3:31 PM EDT ----- Regarding: Follow-up labwork Hi Tabitha Green, can you set this patient up for CBC next Friday Nov 8th under Dr. Broadus John name.  Thank you,  Deanna

## 2023-06-07 NOTE — Plan of Care (Signed)

## 2023-06-07 NOTE — Telephone Encounter (Signed)
Order and reminder in epic.

## 2023-06-07 NOTE — Progress Notes (Signed)
PROGRESS NOTE    Tabitha Green  GMW:[id number] DOB: 02/09/1946 DOA: 06/02/2023 PCP: Jeoffrey Massed, MD  Brief Narrative: 77 y.o. female with medical history significant for A-fib diagnosed April 2024 on Eliquis 5 mg p.o. twice daily, known diverticulosis and internal hemorrhoids as well as GERD, FSGS status post kidney transplant admitted to the hospital with acute blood loss anemia and bright red blood per rectum.  The history of some rectal bleeding in August of this year, she had colonoscopy with evidence of diverticula and internal hemorrhoids.  Developed some hematochezia on 10/14, since that time initially held her Eliquis had an office visit with Dr. Marina Goodell and he recommended that he hold her Eliquis until the bleeding stopped.  She restarted her Eliquis on 10/20 but over the last 2 to 3 days she has had recurrence of intermittent bright red blood per rectum mixed with maroon stools.  Today she had a large loose bowel movement that was maroon, felt dizzy and lightheaded when she was ambulating and so came to the ER for evaluation.    Assessment & Plan:   Principal Problem:   Lower GI bleed Active Problems:   Hypothyroidism   Unspecified essential hypertension   PAF (paroxysmal atrial fibrillation) (HCC)   ABLA (acute blood loss anemia)   Diverticulosis of colon with hemorrhage   Acute blood loss anemia secondary to bright red blood per rectum secondary to Eliquis-likely diverticular bleed versus internal hemorrhoids. Hemodynamically stable. Seen by GI, advanced to soft diet.  Eliquis held until 10/27, then received first dose on 10/27 PM and then AM dose 10/28. Pt had 2 bloody bowel movements. Eliquis held after AM dose on 10/28.  GI recommended keeping her in the hospital for 48 hours for observation.   Hemoglobin 8.6 from 9.  She first to stay another night and watch her hemoglobin tomorrow and hopefully discharge home if it is stable. She reports having another bloody bowel  movement today 06/07/2023  Atrial fibrillation-currently in sinus rhythm, Eliquis on hold as mentioned above.   Hypertension-blood pressure controlled.  Continue home verapamil, with as needed IV hydralazine   History of FSGS and renal transplant-continue Myfortic, Prograf and oral bicarb, renal function is at baseline   Hypothyroidism-Synthroid   Anxiety-lorazepam 0.5 mg p.o. twice daily as needed.    Estimated body mass index is 41.42 kg/m as calculated from the following:   Height as of this encounter: 5\' 5"  (1.651 m).   Weight as of this encounter: 112.9 kg.  DVT prophylaxis: SCDs    Code Status: Full Code  Family Communication:  None present at bedside.    Status is: Observation The patient will require care spanning > 2 midnights and should be moved to inpatient because: Needs another night of observation.  Estimated body mass index is 41.42 kg/m as calculated from the following:   Height as of this encounter: 5\' 5"  (1.651 m).   Weight as of this encounter: 112.9 kg.   Nutritional Assessment: Body mass index is 41.42 kg/m.[name] Seen by dietician.   Subjective: Another witnessed bloody bowel movement today  Objective: Vitals:   06/06/23 1226 06/06/23 2019 06/07/23 0357 06/07/23 1158  BP: (!) 150/85 130/72 135/69 123/60  Pulse: 75 67 65 60  Resp:  18 18 16   Temp: 98.1 F (36.7 C) 98.2 F (36.8 C) 98.4 F (36.9 C) 98.5 F (36.9 C)  TempSrc: Oral Oral Oral Oral  SpO2: 98% 97% 97% 96%  Weight:      Height:  Intake/Output Summary (Last 24 hours) at 06/07/2023 1342 Last data filed at 06/07/2023 1100 Gross per 24 hour  Intake 720 ml  Output --  Net 720 ml   Filed Weights   06/03/23 2000  Weight: 112.9 kg    Examination:  General exam: Appears in no distress  respiratory system: Clear to auscultation. Respiratory effort normal. Cardiovascular system: S1 & S2 heard, RRR. No JVD, murmurs, rubs, gallops or clicks. No pedal edema. Gastrointestinal  system: Abdomen is nondistended, soft and nontender. No organomegaly or masses felt. Normal bowel sounds heard. Central nervous system: Alert and oriented. No focal neurological deficits. Extremities: No edema  Data Reviewed: I have personally reviewed following labs and imaging studies  CBC: Recent Labs  Lab 06/04/23 0502 06/05/23 0452 06/05/23 1208 06/06/23 0855 06/07/23 0517  WBC 6.3 6.0 6.7 5.9 5.8  NEUTROABS 3.4 2.9 4.4 3.3 2.8  HGB 9.0* 8.6* 9.0* 9.0* 8.6*  HCT 29.0* 27.3* 28.4* 28.3* 27.6*  MCV 93.2 92.9 92.8 92.2 92.3  PLT 241 247 247 260 248   Basic Metabolic Panel: Recent Labs  Lab 06/02/23 0920  NA 139  K 3.9  CL 108  CO2 22  GLUCOSE 127*  BUN 17  CREATININE 0.93  CALCIUM 8.8*   GFR: Estimated Creatinine Clearance: 63.5 mL/min (by C-G formula based on SCr of 0.93 mg/dL). Liver Function Tests: Recent Labs  Lab 06/02/23 0920  AST 18  ALT 14  ALKPHOS 66  BILITOT 0.8  PROT 6.2*  ALBUMIN 3.2*   No results for input(s): "LIPASE", "AMYLASE" in the last 168 hours. No results for input(s): "AMMONIA" in the last 168 hours. Coagulation Profile: No results for input(s): "INR", "PROTIME" in the last 168 hours. Cardiac Enzymes: No results for input(s): "CKTOTAL", "CKMB", "CKMBINDEX", "TROPONINI" in the last 168 hours. BNP (last 3 results) No results for input(s): "PROBNP" in the last 8760 hours. HbA1C: No results for input(s): "HGBA1C" in the last 72 hours. CBG: No results for input(s): "GLUCAP" in the last 168 hours. Lipid Profile: No results for input(s): "CHOL", "HDL", "LDLCALC", "TRIG", "CHOLHDL", "LDLDIRECT" in the last 72 hours. Thyroid Function Tests: No results for input(s): "TSH", "T4TOTAL", "FREET4", "T3FREE", "THYROIDAB" in the last 72 hours. Anemia Panel: No results for input(s): "VITAMINB12", "FOLATE", "FERRITIN", "TIBC", "IRON", "RETICCTPCT" in the last 72 hours. Sepsis Labs: No results for input(s): "PROCALCITON", "LATICACIDVEN" in the  last 168 hours.  No results found for this or any previous visit (from the past 240 hour(s)).    Radiology Studies: No results found.  Scheduled Meds:  doxazosin  2 mg Oral BID   hydrocortisone   Rectal BID   levothyroxine  125 mcg Oral Once per day on Monday Tuesday Wednesday Thursday Friday Saturday   losartan  100 mg Oral Daily   magnesium chloride  2 tablet Oral Daily   mycophenolate  360 mg Oral BID   sodium bicarbonate  650 mg Oral BID   tacrolimus  2 mg Oral BID   verapamil  120 mg Oral Q1500   verapamil  240 mg Oral q morning   Continuous Infusions:   LOS: 0 days   Time spent: 37 min  Alwyn Ren, MD  06/07/2023, 1:42 PM

## 2023-06-07 NOTE — Progress Notes (Addendum)
Progress Note  Primary GI: Dr. Marina Goodell  LOS: 0 days   Chief Complaint: Rectal bleeding while on Eliquis, acute anemia   Subjective  Pt sitting in recliner, eating her breakfast. Reports one BM this am with small amount of bright red blood. No SOB, CP, or dizziness. Reports head congestion, otherwise feels fine.   Objective   Vital signs in last 24 hours: Temp:  [98.1 F (36.7 C)-98.4 F (36.9 C)] 98.4 F (36.9 C) (10/30 0357) Pulse Rate:  [65-75] 65 (10/30 0357) Resp:  [18] 18 (10/30 0357) BP: (130-150)/(69-85) 135/69 (10/30 0357) SpO2:  [97 %-98 %] 97 % (10/30 0357) Last BM Date : 06/06/23 Last BM recorded by nurses in past 5 days Stool Type: Type 4 (Like a smooth, soft sausage or snake) (06/06/2023 10:00 AM)  General:   female in no acute distress  Heart:  Regular rate and rhythm; no murmurs Pulm: Clear anteriorly; no wheezing Abdomen: soft, obese, nondistended, normal bowel sounds in all quadrants. Nontender without guarding. No organomegaly appreciated. Extremities:  No edema Neurologic:  Alert and  oriented x4;  No focal deficits.  Psych:  Cooperative. Normal mood and affect.  Intake/Output from previous day: 10/29 0701 - 10/30 0700 In: 960 [P.O.:960] Out: -  Intake/Output this shift: No intake/output data recorded.  Studies/Results: No results found.  Lab Results: Recent Labs    06/05/23 1208 06/06/23 0855 06/07/23 0517  WBC 6.7 5.9 5.8  HGB 9.0* 9.0* 8.6*  HCT 28.4* 28.3* 27.6*  PLT 247 260 248      Scheduled Meds:  doxazosin  2 mg Oral BID   hydrocortisone   Rectal BID   levothyroxine  125 mcg Oral Once per day on Monday Tuesday Wednesday Thursday Friday Saturday   losartan  100 mg Oral Daily   magnesium chloride  2 tablet Oral Daily   mycophenolate  360 mg Oral BID   sodium bicarbonate  650 mg Oral BID   tacrolimus  2 mg Oral BID   verapamil  120 mg Oral Q1500   verapamil  240 mg Oral q morning   Continuous Infusions:    Patient  Narrative:  Tabitha Green is a 77 y.o. female with past medical history significant for end-stage renal disease was on dialysis since 2014 status post renal cell transplant 2017, CMV infection, cholelithiasis, diverticulosis, fatty liver, GERD, B12 deficiency, atrial fibrillation diagnosed 11/2022 on Eliquis 5 mg twice daily. Presents to the ER 10/25 with rectal bleeding and dizziness.    Impression/Plan:  Diverticular bleed after restarting Eliquis (on 10/14). Held until 10/27, then received first dose on 10/27 PM and AM dose 10/28. Pt had 2 bloody bowel movements after restarting so Eliquis held since 10/28.  Hgb 13 -->10.4--->9.0---> 8.6-->9.0-->9.0-->8.6 . HD stable. Neg CTA 10/14.  --If she is without significant bleeding today (tiny amounts okay) she can be discharged home. Restart Eliquis 5 days after discharge.  --We anticipate scheduling a CBC at our office 1 week after discharge and Dr. Marina Goodell will review data and make recommendations about follow-up at that time.  --Recommend FU with cardiology as outpt (need for eliquis or any other alternatives like watchman's procedure to get her off [facility])   Significant colon polyp s/p recent polypectomy 03/10/2023 (Bx-tubular adenoma).  Also had pancolonic diverticulosis.  -Recall colon 03/2026 (Dr Marina Goodell).  Hemorrhoids, using topical cream. Reinforced using at home. Suppositories too expensive.    A-fib (on Eliquis)  Currently in NSR. -being held, Restart Eliquis 5 days after  discharge.     Tabitha Green  06/07/2023, 8:27 AM     [facility] Attending   I have taken an interval history, reviewed the chart and have seen the patient. I agree with the Advanced Practitioner's note, impression and recommendations with the following additions:  Hospitalist has decided to keep the patient another day because of hemoglobin 8.6.  She should be able to go home today versus tomorrow.  Iva Boop, MD, [facility] See Loretha Stapler on call -  gastroenterology for best contact person 06/07/2023 3:39 PM

## 2023-06-08 ENCOUNTER — Telehealth: Payer: Self-pay | Admitting: Cardiology

## 2023-06-08 DIAGNOSIS — K625 Hemorrhage of anus and rectum: Secondary | ICD-10-CM | POA: Diagnosis not present

## 2023-06-08 LAB — CBC
HCT: 26.6 % — ABNORMAL LOW (ref 36.0–46.0)
Hemoglobin: 8.5 g/dL — ABNORMAL LOW (ref 12.0–15.0)
MCH: 29.3 pg (ref 26.0–34.0)
MCHC: 32 g/dL (ref 30.0–36.0)
MCV: 91.7 fL (ref 80.0–100.0)
Platelets: 271 10*3/uL (ref 150–400)
RBC: 2.9 MIL/uL — ABNORMAL LOW (ref 3.87–5.11)
RDW: 13.3 % (ref 11.5–15.5)
WBC: 7.9 10*3/uL (ref 4.0–10.5)
nRBC: 0 % (ref 0.0–0.2)

## 2023-06-08 MED ORDER — BISACODYL 5 MG PO TBEC
10.0000 mg | DELAYED_RELEASE_TABLET | Freq: Every day | ORAL | Status: DC
  Administered 2023-06-08: 10 mg via ORAL
  Filled 2023-06-08: qty 2

## 2023-06-08 MED ORDER — ORAL CARE MOUTH RINSE: 15.0000 mL | OROMUCOSAL | Status: DC | PRN

## 2023-06-08 MED ORDER — POLYETHYLENE GLYCOL 3350 17 G PO PACK
17.0000 g | PACK | Freq: Every day | ORAL | Status: DC
  Administered 2023-06-08: 17 g via ORAL
  Filled 2023-06-08: qty 1

## 2023-06-08 MED ORDER — APIXABAN 5 MG PO TABS: 5.0000 mg | ORAL_TABLET | Freq: Two times a day (BID) | ORAL | 1 refills | Status: DC

## 2023-06-08 MED ORDER — POLYETHYLENE GLYCOL 3350 17 G PO PACK: 17.0000 g | PACK | Freq: Every day | ORAL | 0 refills | Status: DC

## 2023-06-08 NOTE — Telephone Encounter (Signed)
Spoke with the patient who was recently hospitalized for a GI bleed. Eliqius was held and she is supposed to restart in 5 days. She was advised to reach out to her cardiologist to follow up on necessity of Eliquis.  Patient is scheduled for an appointment with Jari Favre, PA on 11/8 to discuss.

## 2023-06-08 NOTE — Progress Notes (Signed)
Progress Note  Primary GI: Dr. Marina Goodell  LOS: 0 days   Chief Complaint: Rectal bleeding while on Eliquis, acute anemia   Subjective  Pt lying in bed, reports she is doing well. No BM since yesterday morning. No blood per rectum. Denies SOB, CP, or dizziness. Ready to go home.   Objective   Vital signs in last 24 hours: Temp:  [98.1 F (36.7 C)-98.5 F (36.9 C)] 98.1 F (36.7 C) (10/31 0602) Pulse Rate:  [60-82] 82 (10/31 0602) Resp:  [16-19] 19 (10/30 2055) BP: (123-141)/(60-81) 141/81 (10/31 0602) SpO2:  [96 %-98 %] 97 % (10/31 0602) Last BM Date : 06/07/23 Last BM recorded by nurses in past 5 days Stool Type: Type 4 (Like a smooth, soft sausage or snake) (06/07/2023  9:42 AM)  General:   female in no acute distress  Heart:  Regular rate and rhythm; no murmurs Pulm: Clear anteriorly; no wheezing Abdomen: soft, obese, nondistended, normal bowel sounds in all quadrants. Nontender without guarding. No organomegaly appreciated. Extremities:  No edema Neurologic:  Alert and  oriented x4;  No focal deficits.  Psych:  Cooperative. Normal mood and affect.  Intake/Output from previous day: 10/30 0701 - 10/31 0700 In: 120 [P.O.:120] Out: -  Intake/Output this shift: No intake/output data recorded.  Studies/Results: No results found.  Lab Results: Recent Labs    06/06/23 0855 06/07/23 0517 06/08/23 0444  WBC 5.9 5.8 7.9  HGB 9.0* 8.6* 8.5*  HCT 28.3* 27.6* 26.6*  PLT 260 248 271      Scheduled Meds:  bisacodyl  10 mg Oral Daily   doxazosin  2 mg Oral BID   hydrocortisone   Rectal BID   levothyroxine  125 mcg Oral Once per day on Monday Tuesday Wednesday Thursday Friday Saturday   losartan  100 mg Oral Daily   magnesium chloride  2 tablet Oral Daily   mycophenolate  360 mg Oral BID   polyethylene glycol  17 g Oral Daily   silver sulfADIAZINE   Topical BID   sodium bicarbonate  650 mg Oral BID   tacrolimus  2 mg Oral BID   verapamil  120 mg Oral Q1500    verapamil  240 mg Oral q morning   Continuous Infusions:    Patient Narrative:  Tabitha Green is a 77 y.o. female with past medical history significant for end-stage renal disease was on dialysis since 2014 status post renal cell transplant 2017, CMV infection, cholelithiasis, diverticulosis, fatty liver, GERD, B12 deficiency, atrial fibrillation diagnosed 11/2022 on Eliquis 5 mg twice daily. Presents to the ER 10/25 with rectal bleeding and dizziness.    Impression/Plan:  Diverticular bleed after restarting Eliquis (on 10/14). Held until 10/27, then received first dose on 10/27 PM and AM dose 10/28. Pt had 2 bloody bowel movements after restarting so Eliquis held since 10/28.  Hgb 13 -->10.4--->9.0---> 8.6-->9.0-->9.0-->8.6-->8.5. HD stable. Neg CTA 10/14.  -- Pt being  discharged home today. Discussed ER precautions. -- Restart Eliquis 5 days after discharge.  --We will schedule CBC at our office 1 week after discharge and Dr. Marina Goodell will review data and make recommendations about follow-up at that time.  --Recommend FU with cardiology as outpt (need for eliquis or any other alternatives like watchman's procedure to get her off [facility])   Significant colon polyp s/p recent polypectomy 03/10/2023 (Bx-tubular adenoma).  Also had pancolonic diverticulosis.  -Recall colon 03/2026 (Dr Marina Goodell).  Hemorrhoids, using topical cream. Reinforced using at home. Suppositories too expensive.  A-fib (on Eliquis)  Currently in NSR. -being held, Restart Eliquis 5 days after discharge.     Tabitha Green  06/08/2023, 8:57 AM

## 2023-06-08 NOTE — Plan of Care (Signed)

## 2023-06-08 NOTE — Telephone Encounter (Signed)
Pt c/o medication issue:  1. Name of Medication: Eliquis  2. How are you currently taking this medication (dosage and times per day)?   3. Are you having a reaction (difficulty breathing--STAT)?   4. What is your medication issue? Patient was in the hospital for Diverticulosis- they stopped her Eliquis and she is till off of it- she was told to contact her Cardiologist and discuss the Eliquis

## 2023-06-13 ENCOUNTER — Telehealth: Payer: Self-pay | Admitting: Internal Medicine

## 2023-06-13 ENCOUNTER — Ambulatory Visit (INDEPENDENT_AMBULATORY_CARE_PROVIDER_SITE_OTHER): Payer: Medicare Other | Admitting: Family Medicine

## 2023-06-13 VITALS — BP 119/69 | HR 72 | Temp 99.0°F | Ht 65.0 in | Wt 244.0 lb

## 2023-06-13 DIAGNOSIS — K649 Unspecified hemorrhoids: Secondary | ICD-10-CM | POA: Diagnosis not present

## 2023-06-13 DIAGNOSIS — D500 Iron deficiency anemia secondary to blood loss (chronic): Secondary | ICD-10-CM | POA: Diagnosis not present

## 2023-06-13 DIAGNOSIS — K922 Gastrointestinal hemorrhage, unspecified: Secondary | ICD-10-CM | POA: Diagnosis not present

## 2023-06-13 DIAGNOSIS — K5790 Diverticulosis of intestine, part unspecified, without perforation or abscess without bleeding: Secondary | ICD-10-CM

## 2023-06-13 DIAGNOSIS — N1831 Chronic kidney disease, stage 3a: Secondary | ICD-10-CM

## 2023-06-13 LAB — BASIC METABOLIC PANEL
BUN: 18 mg/dL (ref 6–23)
CO2: 26 meq/L (ref 19–32)
Calcium: 9.1 mg/dL (ref 8.4–10.5)
Chloride: 106 meq/L (ref 96–112)
Creatinine, Ser: 0.96 mg/dL (ref 0.40–1.20)
GFR: 56.96 mL/min — ABNORMAL LOW (ref >60.00)
Glucose, Bld: 114 mg/dL — ABNORMAL HIGH (ref 70–99)
Potassium: 4 meq/L (ref 3.5–5.1)
Sodium: 141 meq/L (ref 135–145)

## 2023-06-13 LAB — CBC
HCT: 30.3 % — ABNORMAL LOW (ref 36.0–46.0)
Hemoglobin: 9.4 g/dL — ABNORMAL LOW (ref 12.0–15.0)
MCHC: 30.9 g/dL (ref 30.0–36.0)
MCV: 89.1 fL (ref 78.0–100.0)
Platelets: 331 10*3/uL (ref 150.0–400.0)
RBC: 3.4 Mil/uL — ABNORMAL LOW (ref 3.87–5.11)
RDW: 14.5 % (ref 11.5–15.5)
WBC: 6.7 10*3/uL (ref 4.0–10.5)

## 2023-06-13 NOTE — Telephone Encounter (Signed)
Patient called to follow up from ED visit for rectal bleeding. Patient stated she is not currently having any GI symptoms. She did not want to schedule out far. Please advise

## 2023-06-13 NOTE — Progress Notes (Signed)
06/13/2023  CC:  Chief Complaint  Patient presents with   Hospitalization Follow-up    Patient is a 77 y.o. female who presents for  hospital follow up. Dates hospitalized: October 25 to June 05, 2023. Days since d/c from hospital: 8 days Patient was discharged from hospital to home. Reason for admission to hospital: Lower gastrointestinal bleeding, anemia due to blood loss,  I have reviewed patient's discharge summary plus pertinent specific notes, labs, and imaging from the hospitalization.   She had been having bright red blood per rectum in the setting of Eliquis for her A-fib. (She had initially presented to [facility] emergency department on 05/22/2023 for this problem.  Hemoglobin was normal.  CTA abdomen and pelvis showed severe diverticulosis without active GI bleed.  Her Eliquis was discontinued and she was instructed to follow-up with GI as an outpatient). She then presented to [facility] health emergency department with ongoing bleeding and was admitted.  No transfusion was required in the hospital.  She did not have endoscopy either (she had colonoscopy March 10, 2023 at which time she was found to have 1 adenoma and extensive diverticulosis as well as internal hemorrhoids). GI instructed her to hold off from restarting Eliquis until she follows up with cardiology.  Hemoglobin trended from 13 down to 8.6 and stayed stable for the last 2 days in hospital. The plan was for her to follow-up with her cardiologist to discuss the plans regarding Eliquis in the future.  Currently:  She has had no blood in stools since getting discharged from the hospital. She definitely feels more tired today than she had been.  A little bit of feeling cold.  No fevers. No abdominal pain.  Not currently taking any iron supplement. She denies any sense of racing heart, palpitations, dizziness, shortness of breath, or chest pain.  Medication reconciliation was done today and patient is  taking meds as recommended by discharging hospitalist/specialist.   Discharge medication list: Vitamin B12 once daily, doxazosin 2 mg twice daily, hydrocortisone rectal cream twice daily, levothyroxine 125 mcg tab, half tab 2 days a week and whole tab all other days, lorazepam 0.5 mg twice daily as needed, losartan 100 mg daily, magnesium chloride 128 mg daily, mycophenolate 360 mg twice daily, tacrolimus 2 mg twice daily, verapamil CR 240 mg every morning and 120 mg q. afternoon, and vitamin D 50,000 unit Twice a week.  ROS as above, plus-->no wheezing, no cough, no HAs, no rashes, no melena/hematochezia.  No polyuria or polydipsia.  No myalgias or arthralgias.  No focal weakness, paresthesias, or tremors.  No acute vision or hearing abnormalities.  No dysuria or unusual/new urinary urgency or frequency.  No recent changes in lower legs. No n/v/d or abd pain.    PMH:  Past Medical History:  Diagnosis Date   Anemia    when I was on dyalysis   Anxiety    Arthritis    Cataract    Cholelithiasis 04/2021   noted on CT abd/pelv done for R lower abd swelling and MR abd   Chronic renal insufficiency, stage III (moderate) (HCC) 10/2015   Post-transplant baseline sCr 1.2-1.4.     CMV infection [facility]) summer 2017   Valcyte per ID/Renal transplant team   Depression    Diverticulosis    a. 05/2012 colonoscopy   Erosive lichen planus of vulva    Topical steroids (managed by Dr. Robby Sermon [facility] via [facility].   ESRD (end stage renal disease) (HCC)  began dialysis 2014--followed by Dr. Lowell Guitar.  Received deceased donor kidney transplant 10/2015.   Fatty liver 10/2021   with hepatomegaly   FSGS (focal segmental glomerulosclerosis)    right kidney; hx of left renal cell cancer and got nephrectomy 1993.   GERD (gastroesophageal reflux disease)    Gout    Heart murmur    (Diastolic) ECHO 08/2013 showed that this murmur is coming from pt's R arm AV fistula   Hiatal  hernia    History of renal cell cancer 1993   Left nephrectomy   Hyperlipidemia    Hypertension    Hypothyroidism    Impingement syndrome of left shoulder 04/2016   Dr. Bertram Savin to PT   Inguinal hernia    right   Kidney cancer, primary, with metastasis from kidney to other site [facility])    Lumbar spinal stenosis    Chronic LBP with bilat neurogenic claudication   Metatarsal fracture, pathologic 01/2018   Right; orthocarolina-->post op shoe continued, f/u x-ray planned.   Obesity    Osteoarthritis of left knee 08/2017   severe, diffuse, tricompartmental.  Responded to steroid injection.     Osteoporosis 06/07/2016   DEXA T-score of -3.1.  Fosamax planned 2017 but dental work prevented start..  Pathological toe fx 12/2017--repeat DEXA 08/2018, T-score -3.3 radius. 12/2020 T score radius -3.7.   PAF (paroxysmal atrial fibrillation) (HCC)    dx 11/2022   Pneumonia    Renal transplant recipient Dec 06, 2015   Baseline Cr as of 09/2020 1.2-1.4   Simple hepatic cyst    2022   Solitary kidney, acquired    SVT (supraventricular tachycardia) (HCC)    UTI (urinary tract infection)    Ventral hernia    Vitamin B12 deficiency 12/2018   Starting replacement as of 12/11/2018    PSH:  Past Surgical History:  Procedure Laterality Date   AV FISTULA PLACEMENT Right    Right arm: aneurismal dilatation 2017 being followed by CV surgeons   CARDIOVASCULAR STRESS TEST     03/10/15 ETT ([facility]): Exercise ECG negative at 83% max predicted HR.    CATARACT EXTRACTION     COLONOSCOPY  2003; 05/2012   2013 diverticulosis, no polyps.  03/10/23 ->one adenoma and extensive diverticulosis as well as internal hemorrhoids   colonoscopy with polypectomy     Dr Marina Goodell   DEXA  06/07/2016; 08/22/2018; 12/30/20   2017 T-score -3.1.  2020 T score -3.3.  2022 T score -3.7   HYSTERECTOMY ABDOMINAL WITH SALPINGECTOMY     KIDNEY TRANSPLANT  2015-12-06   Deceased donor kidney transplant, with Thymoglobulin induction    LIGATION OF ARTERIOVENOUS  FISTULA Right 02/14/2017   Procedure: LIGATION OF ARTERIOVENOUS  FISTULA;  Surgeon: Chuck Hint, MD;  Location: [facility] OR;  Service: Vascular;  Laterality: Right;   NEPHRECTOMY  1993   for malignancy- left   RENAL BIOPSY     right   RESECTION OF ARTERIOVENOUS FISTULA ANEURYSM Right 02/14/2017   Procedure: EXCISION OF RIGHT BRACHIOCEPHALIC ARTERIOVENOUS FISTULA ANEURYSM;  Surgeon: Chuck Hint, MD;  Location: [facility] OR;  Service: Vascular;  Laterality: Right;   TEE WITHOUT CARDIOVERSION N/A 08/27/2013   Procedure: TRANSESOPHAGEAL ECHOCARDIOGRAM (TEE);  Surgeon: Wendall Stade, MD;  Location: [facility] ENDOSCOPY;  Service: Cardiovascular;  Laterality: N/A;   TOTAL ABDOMINAL HYSTERECTOMY W/ BILATERAL SALPINGOOPHORECTOMY  1997   fibroids   TRANSTHORACIC ECHOCARDIOGRAM     03/10/15 echo ([facility]): LV cavity normal in size, focal basal hypertrophy, normal systolic  function, EF 55% (visual est). Normal wall motion, no regional wall motion abnormalities. Mild diastolic dysfunction with normal LA chamber size. No significant valve stenosis or regurgitation.  12/2022 normal   VULVA / PERINEUM BIOPSY  2015    MEDS:  Outpatient Medications Prior to Visit  Medication Sig Dispense Refill   acetaminophen (TYLENOL) 500 MG tablet Take 1,000 mg by mouth every 8 (eight) hours as needed for moderate pain.     AYR SALINE NASAL DROPS NA Place 2 sprays into both nostrils daily.     Camphor-Eucalyptus-Menthol (VICKS VAPORUB EX) Apply 1 application topically daily.     Cyanocobalamin (B-12 PO) Take 1 tablet by mouth daily.     doxazosin (CARDURA) 2 MG tablet TAKE 1 TABLET(2 MG) BY MOUTH TWICE DAILY (Patient taking differently: Take 2 mg by mouth 2 (two) times daily.) 180 tablet 3   hydrocortisone (ANUSOL-HC) 2.5 % rectal cream Place 1 Application rectally 2 (two) times daily. 30 g 1   hydrocortisone (ANUSOL-HC) 25 MG suppository Place 1 suppository (25 mg  total) rectally every 12 (twelve) hours. 30 suppository 0   levothyroxine (SYNTHROID) 125 MCG tablet 1/2 tab po on two days a week, whole tab all other days (Patient taking differently: Take 125 mcg by mouth See admin instructions. 1 tablet 6 days a week, no tablets one day 7) 90 tablet 3   LORazepam (ATIVAN) 0.5 MG tablet Take 1 tablet (0.5 mg total) by mouth 2 (two) times daily as needed for anxiety. 30 tablet 1   losartan (COZAAR) 100 MG tablet TAKE 1 TABLET BY MOUTH DAILY 90 tablet 1   magnesium chloride (SLOW-MAG) 64 MG TBEC SR tablet Take 2 tablets (128 mg total) by mouth daily. 180 tablet 1   mycophenolate (MYFORTIC) 360 MG TBEC EC tablet Take 360 mg by mouth 2 (two) times daily.     nystatin powder Apply 1 Application topically once a week.     silver sulfADIAZINE (SILVADENE) 1 % cream Apply 1 Application topically daily.     sodium bicarbonate 650 MG tablet Take 1 tablet (650 mg total) by mouth 2 (two) times daily. Takes 1 in AM and 1 at bedtime 180 tablet 3   tacrolimus (PROGRAF) 1 MG capsule Take 2 mg by mouth 2 (two) times daily.     triamcinolone cream (KENALOG) 0.1 % Apply 1 application topically daily.     verapamil (CALAN-SR) 120 MG CR tablet TAKE 1 TABLET BY MOUTH EVERY  AFTERNOON (Patient taking differently: Take 120 mg by mouth daily.) 90 tablet 0   verapamil (CALAN-SR) 240 MG CR tablet TAKE 1 TABLET BY MOUTH EVERY  MORNING 90 tablet 1   Vitamin D, Ergocalciferol, (DRISDOL) 1.25 MG (50000 UNIT) CAPS capsule Take 1 capsule (50,000 Units total) by mouth 2 (two) times a week. 24 capsule 0   [START ON 06/14/2023] apixaban (ELIQUIS) 5 MG TABS tablet Take 1 tablet (5 mg total) by mouth 2 (two) times daily. (Patient not taking: Reported on 06/13/2023) 180 tablet 1   polyethylene glycol (MIRALAX / GLYCOLAX) 17 g packet Take 17 g by mouth daily. (Patient not taking: Reported on 06/13/2023) 14 each 0   No facility-administered medications prior to visit.    Physical Exam    06/13/2023     9:34 AM 06/08/2023   10:04 AM 06/08/2023    6:02 AM  Vitals with BMI  Height 5\' 5"     Weight 244 lbs    BMI 40.6    Systolic 119 141 161  Diastolic 69 81 81  Pulse 72  82   Gen: Alert, well appearing.  Patient is oriented to person, place, time, and situation. AFFECT: pleasant, lucid thought and speech. CV: RRR, no m/r/g.   LUNGS: CTA bilat, nonlabored resps, good aeration in all lung fields. ABD: soft, NT/ND EXT: no clubbing or cyanosis.  no edema.   Pertinent labs/imaging Last CBC Lab Results  Component Value Date   WBC 7.9 06/08/2023   HGB 8.5 (L) 06/08/2023   HCT 26.6 (L) 06/08/2023   MCV 91.7 06/08/2023   MCH 29.3 06/08/2023   RDW 13.3 06/08/2023   PLT 271 06/08/2023   Lab Results  Component Value Date   IRON 104 03/11/2021   TIBC 342 03/11/2021   FERRITIN 34 03/11/2021   Last metabolic panel Lab Results  Component Value Date   GLUCOSE 127 (H) 06/02/2023   NA 139 06/02/2023   K 3.9 06/02/2023   CL 108 06/02/2023   CO2 22 06/02/2023   BUN 17 06/02/2023   CREATININE 0.93 06/02/2023   GFRNONAA >60 06/02/2023   CALCIUM 8.8 (L) 06/02/2023   PHOS 2.9 01/04/2018   PROT 6.2 (L) 06/02/2023   ALBUMIN 3.2 (L) 06/02/2023   BILITOT 0.8 06/02/2023   ALKPHOS 66 06/02/2023   AST 18 06/02/2023   ALT 14 06/02/2023   ANIONGAP 9 06/02/2023   Last vitamin D Lab Results  Component Value Date   VD25OH 22.47 (L) 05/11/2023   Lab Results  Component Value Date   TSH 8.93 (H) 05/11/2023   ASSESSMENT/PLAN:  #1 anemia due to lower GI bleed.  Suspected to be hemorrhoidal bleeding. No further blood in stool since discharge home 8 days ago. She is still holding Eliquis until further consultation with her cardiologist. She is set to see them in 3 days. Monitor CBC and iron panel today. Go ahead and start iron sulfate 325 mg once daily.  #2 PAF, chronic anticoagulation with Eliquis. Holding Eliquis in the setting of #1 above. She has cardiology follow-up in 3 days to  revisit this issue.  #3 chronic renal insufficiency stage III. History of renal transplant. Monitor renal function and electrolytes today. Continue current dosing of mycophenolate and tacrolimus.  #4 hypothyroidism.  Most recent TSH about 1 month ago was slightly elevated. Adjusted levothyroxine dose just a little bit: Take a whole 125 mcg tab 6 days a week and a half tab 1 day a week. Plan recheck TSH at next follow-up in February.  FOLLOW UP:   She has CPE appointment arranged for 09/15/2023.  Signed:  Santiago Bumpers, MD           06/13/2023

## 2023-06-13 NOTE — Telephone Encounter (Signed)
If feeling okay and not bleeding, does not need a repeat CBC on Friday

## 2023-06-13 NOTE — Telephone Encounter (Signed)
Left message for pt to call back. Pt is due to have a CBC drawn 11/8.  Pt scheduled to see Doug Sou PA 06/21/23 at 10am.   Pt was supposed to have CBC drawn 11/8 but had one drawn today at PCP office. She wants to know if she needs to repeat the CBC on Friday. Please advise.

## 2023-06-13 NOTE — Telephone Encounter (Signed)
Spoke with pt and she is aware of Dr. Perry's recommendations. 

## 2023-06-14 ENCOUNTER — Ambulatory Visit: Payer: Medicare Other | Admitting: Family Medicine

## 2023-06-14 LAB — IRON,TIBC AND FERRITIN PANEL
%SAT: 5 % — ABNORMAL LOW (ref 16–45)
Ferritin: 7 ng/mL — ABNORMAL LOW (ref 16–288)
Iron: 17 ug/dL — ABNORMAL LOW (ref 45–160)
TIBC: 373 ug/dL (ref 250–450)

## 2023-06-15 ENCOUNTER — Encounter: Payer: Self-pay | Admitting: Family Medicine

## 2023-06-15 ENCOUNTER — Other Ambulatory Visit: Payer: Self-pay | Admitting: Family Medicine

## 2023-06-15 DIAGNOSIS — R928 Other abnormal and inconclusive findings on diagnostic imaging of breast: Secondary | ICD-10-CM

## 2023-06-15 NOTE — Telephone Encounter (Signed)
I do not want you to worry too much. False positive mammograms are pretty common. Sometimes when they take a closer look they can tell whether the area looks "innocent" (benign) or more worrisome.

## 2023-06-16 ENCOUNTER — Encounter: Payer: Self-pay | Admitting: Physician Assistant

## 2023-06-16 ENCOUNTER — Other Ambulatory Visit: Payer: Self-pay | Admitting: *Deleted

## 2023-06-16 ENCOUNTER — Ambulatory Visit: Payer: Medicare Other | Attending: Physician Assistant | Admitting: Physician Assistant

## 2023-06-16 VITALS — BP 108/68 | HR 56 | Ht 65.0 in | Wt 244.0 lb

## 2023-06-16 DIAGNOSIS — I4819 Other persistent atrial fibrillation: Secondary | ICD-10-CM

## 2023-06-16 DIAGNOSIS — E039 Hypothyroidism, unspecified: Secondary | ICD-10-CM

## 2023-06-16 DIAGNOSIS — D6869 Other thrombophilia: Secondary | ICD-10-CM

## 2023-06-16 DIAGNOSIS — I480 Paroxysmal atrial fibrillation: Secondary | ICD-10-CM | POA: Diagnosis not present

## 2023-06-16 LAB — HEMOGLOBIN AND HEMATOCRIT, BLOOD
Hematocrit: 29.9 % — ABNORMAL LOW (ref 34.0–46.6)
Hemoglobin: 9.7 g/dL — ABNORMAL LOW (ref 11.1–15.9)

## 2023-06-16 NOTE — Patient Instructions (Signed)
Medication Instructions:  Your physician recommends that you continue on your current medications as directed. Please refer to the Current Medication list given to you today.  *If you need a refill on your cardiac medications before your next appointment, please call your pharmacy*   Lab Work: TODAY:  STAT H/H & TSH  1 WEEK:  H/H AT A NEAR [facility]  If you have labs (blood work) drawn today and your tests are completely normal, you will receive your results only by: MyChart Message (if you have MyChart) OR A paper copy in the mail If you have any lab test that is abnormal or we need to change your treatment, we will call you to review the results.   Testing/Procedures: None ordered   Follow-Up: At [facility], you and your health needs are our priority.  As part of our continuing mission to provide you with exceptional heart care, we have created designated Provider Care Teams.  These Care Teams include your primary Cardiologist (physician) and Advanced Practice Providers (APPs -  Physician Assistants and Nurse Practitioners) who all work together to provide you with the care you need, when you need it.  We recommend signing up for the patient portal called "MyChart".  Sign up information is provided on this After Visit Summary.  MyChart is used to connect with patients for Virtual Visits (Telemedicine).  Patients are able to view lab/test results, encounter notes, upcoming appointments, etc.  Non-urgent messages can be sent to your provider as well.   To learn more about what you can do with MyChart, go to [email or web address].    Your next appointment:   As scheduled  Provider:   Charlton Haws, MD     Other Instructions

## 2023-06-16 NOTE — Progress Notes (Signed)
Cardiology Office Note:  .   Date:  06/16/2023  ID:  Tabitha Green, DOB 06/30/1946, MRN [id number] PCP: Jeoffrey Massed, MD  [facility] Providers Cardiologist:  Charlton Haws, MD Electrophysiologist:  Lanier Prude, MD {  History of Present Illness: .   Tabitha Green is a 77 y.o. female with past medical history of renal transplant, HTN, HLD, hypothyroidism and PAF here for follow-up appointment.  History includes SVT 915 diagnosed with PAF in 2014 and now on with Eliquis prescription for CHA2DS2-VASc 5-5 mg daily unfortunately, she cannot tell when she is out of rhythm.  Seen afebrile/25 and was in atrial fibrillation but 01/16/2023 was normal sinus rhythm.  Monitor was ordered 01/08/2023 which showed normal sinus rhythm, low T wave amplitude but no obvious PAF.  Read by Dr. Ladona Ridgel.  TTE 12/28/2022 with LVEF 65%, moderate LAE, no significant valve disease.  AAT complicated by taking Prograf.  No lability began M ablation.  Referred to EP to go over options.  Creatinine baseline is normal at 1.0 and K was 3.9 12/29/2022.  Hematocrit and platelets normal with no bleeding issues.  Today, she presents with a history of atrial fibrillation, hypertension, and hypothyroidism, presents after a recent hospitalization due to rectal bleeding. The bleeding, which was red to maroon in color and included clots, was attributed to diverticulosis and hemorrhoids. The patient was taken off Eliquis during this time. Since discharge, the patient reports no current bleeding and has not restarted Eliquis. The patient also mentions experiencing fatigue, which may be related to a low hemoglobin level of 8.5 at discharge. The patient has not noticed any fast or funny heartbeats recently and is unsure about the frequency of atrial fibrillation episodes.  She has not had any bleeding from her rectum recently and her GI doctor said it was okay to go back on her Eliquis however she is seeking guidance today from  [place].  Reports no shortness of breath nor dyspnea on exertion. Reports no chest pain, pressure, or tightness. No edema, orthopnea, PND. Reports no palpitations.   ROS: Pertinent ROS in HPI  Studies Reviewed: .       Echocardiogram 12/28/2022 IMPRESSIONS     1. Left ventricular ejection fraction, by estimation, is 60 to 65%. The  left ventricle has normal function. The left ventricle has no regional  wall motion abnormalities. There is moderate left ventricular hypertrophy.  Left ventricular diastolic function   could not be evaluated.   2. Right ventricular systolic function is normal. The right ventricular  size is normal.   3. Left atrial size was moderately dilated.   4. The mitral valve is normal in structure. Trivial mitral valve  regurgitation. No evidence of mitral stenosis.   5. The aortic valve has an indeterminant number of cusps. Aortic valve  regurgitation is not visualized. No aortic stenosis is present.   6. The inferior vena cava is normal in size with greater than 50%  respiratory variability, suggesting right atrial pressure of 3 mmHg.   FINDINGS   Left Ventricle: Left ventricular ejection fraction, by estimation, is 60  to 65%. The left ventricle has normal function. The left ventricle has no  regional wall motion abnormalities. The left ventricular internal cavity  size was normal in size. There is   moderate left ventricular hypertrophy. Left ventricular diastolic  function could not be evaluated due to atrial fibrillation. Left  ventricular diastolic function could not be evaluated.   Right Ventricle: The right ventricular  size is normal. Right ventricular  systolic function is normal.   Left Atrium: Left atrial size was moderately dilated.   Right Atrium: Right atrial size was normal in size.   Pericardium: There is no evidence of pericardial effusion.   Mitral Valve: The mitral valve is normal in structure. Trivial mitral  valve regurgitation. No  evidence of mitral valve stenosis.   Tricuspid Valve: The tricuspid valve is normal in structure. Tricuspid  valve regurgitation is trivial. No evidence of tricuspid stenosis.   Aortic Valve: The aortic valve has an indeterminant number of cusps.  Aortic valve regurgitation is not visualized. No aortic stenosis is  present.   Pulmonic Valve: The pulmonic valve was grossly normal. Pulmonic valve  regurgitation is not visualized. No evidence of pulmonic stenosis.   Aorta: The aortic root is normal in size and structure.   Venous: The inferior vena cava is normal in size with greater than 50%  respiratory variability, suggesting right atrial pressure of 3 mmHg.   IAS/Shunts: No atrial level shunt detected by color flow Doppler.   Risk Assessment/Calculations:    CHA2DS2-VASc Score = 5   This indicates a 7.2% annual risk of stroke. The patient's score is based upon: CHF History: 0 HTN History: 1 Diabetes History: 0 Stroke History: 0 Vascular Disease History: 1 Age Score: 2 Gender Score: 1        STOP-Bang Score:  5      Physical Exam:   VS:  BP 108/68   Pulse (!) 56   Ht 5\' 5"  (1.651 m)   Wt 244 lb (110.7 kg)   SpO2 97%   BMI 40.60 kg/m    Wt Readings from Last 3 Encounters:  06/16/23 244 lb (110.7 kg)  06/13/23 244 lb (110.7 kg)  06/03/23 248 lb 14.4 oz (112.9 kg)    GEN: Well nourished, well developed in no acute distress NECK: No JVD; No carotid bruits CARDIAC: RRR, no murmurs, rubs, gallops RESPIRATORY:  Clear to auscultation without rales, wheezing or rhonchi  ABDOMEN: Soft, non-tender, non-distended EXTREMITIES:  No edema; No deformity   ASSESSMENT AND PLAN: .    Renal transplant -Stable with creatinine 0.93 (06/02/2023)  Hypertension -Blood pressure well-controlled today 108/68 -Continue current medication regimen including losartan 100 mg daily and verapamil 120 mg every afternoon and 240 mg in the morning -Continue low-sodium, heart healthy  diet  Atrial Fibrillation Recent hospitalization for rectal bleeding while on Eliquis. Currently not experiencing bleeding. Discussed the importance of anticoagulation for stroke prevention and the potential for a Watchman device as a long-term solution. -Hemoglobin today 9.7, trending up.  I called the patient and told her it was okay to restart Eliquis 5 mg twice a day -Check hemoglobin again early next week. -Refer to Dr. Lalla Brothers for [facility] device consultation.  Diverticulosis and Hemorrhoids Recent rectal bleeding leading to hospitalization. No current bleeding. -Monitor for any signs of bleeding and report immediately.  Hypothyroidism Last TSH was high in October. -Check TSH today and forward results to Dr. Marvel Plan for potential dose adjustment.  Hypertension Well controlled on Verapamil and Losartan. -Continue current regimen.      Dispo: She can follow-up with Dr. Lalla Brothers and Dr. Eden Emms on 1/31  Signed, Sharlene Dory, PA-C

## 2023-06-17 LAB — TSH: TSH: 46.8 u[IU]/mL — ABNORMAL HIGH (ref 0.450–4.500)

## 2023-06-19 ENCOUNTER — Encounter: Payer: Self-pay | Admitting: *Deleted

## 2023-06-19 NOTE — Progress Notes (Signed)
Pls notify Tabitha Green that her cardiology provider notified me her thyroid hormone level is low. This could explain her fatigue. I want her to increase her dosing of levothyroxine to: Whole tab of 125 mcg levothyroxine 3 days per week and one half tab all other days. Recheck TSH 6 weeks, diagnosis acquired hypothyroidism.

## 2023-06-20 ENCOUNTER — Encounter: Payer: Self-pay | Admitting: Family Medicine

## 2023-06-20 ENCOUNTER — Other Ambulatory Visit: Payer: Self-pay

## 2023-06-20 NOTE — Telephone Encounter (Signed)
LVM for pt to return call

## 2023-06-20 NOTE — Telephone Encounter (Signed)
Pt had TSH level checked on 11/8, advised to follow up with PCP regarding titration instructions.  Please review and confirm. Med pending

## 2023-06-20 NOTE — Telephone Encounter (Signed)
Pls call her and have her tell you the dosing schedule she has been doing over the last month. Her thyroid level was very low when her cardiology provider saw her on 06/16/23 and I need to adjust her dose. Let me know.

## 2023-06-20 NOTE — Telephone Encounter (Signed)
Pt returned call and advised she had been taking 1 whole tablet 6 days a week and 1 day skipped a dose entirely. She has not had the medication in at least 2 days.  Please further advise on sig directions

## 2023-06-21 ENCOUNTER — Encounter: Payer: Self-pay | Admitting: Gastroenterology

## 2023-06-21 ENCOUNTER — Ambulatory Visit (INDEPENDENT_AMBULATORY_CARE_PROVIDER_SITE_OTHER): Payer: Medicare Other | Admitting: Gastroenterology

## 2023-06-21 VITALS — BP 124/70 | HR 88 | Ht 65.0 in | Wt 248.0 lb

## 2023-06-21 DIAGNOSIS — K922 Gastrointestinal hemorrhage, unspecified: Secondary | ICD-10-CM

## 2023-06-21 DIAGNOSIS — K625 Hemorrhage of anus and rectum: Secondary | ICD-10-CM | POA: Insufficient documentation

## 2023-06-21 MED ORDER — LEVOTHYROXINE SODIUM 125 MCG PO TABS: ORAL_TABLET | ORAL | 1 refills | Status: DC

## 2023-06-21 NOTE — Patient Instructions (Signed)
Follow up as needed.   Repeat labs per PCP.   _______________________________________________________  If your blood pressure at your visit was 140/90 or greater, please contact your primary care physician to follow up on this.  _______________________________________________________  If you are age 77 or older, your body mass index should be between 23-30. Your Body mass index is 41.27 kg/m. If this is out of the aforementioned range listed, please consider follow up with your Primary Care Provider.  If you are age 60 or younger, your body mass index should be between 19-25. Your Body mass index is 41.27 kg/m. If this is out of the aformentioned range listed, please consider follow up with your Primary Care Provider.   ________________________________________________________  The [facility] providers would like to encourage you to use [facility] to communicate with providers for non-urgent requests or questions.  Due to long hold times on the telephone, sending your provider a message by [facility] may be a faster and more efficient way to get a response.  Please allow 48 business hours for a response.  Please remember that this is for non-urgent requests.  _______________________________________________________

## 2023-06-21 NOTE — Telephone Encounter (Signed)
Okay, prescription for 125 mcg levothyroxine sent today. New dosing instructions are 1-1/2 tabs on Mondays, Wednesdays, and Fridays.  1 tab all other days of the week. Needs recheck TSH in 4 to 6 weeks.  Diagnosis is acquired hypothyroidism. Thanks

## 2023-06-21 NOTE — Progress Notes (Signed)
Noted  

## 2023-06-21 NOTE — Progress Notes (Signed)
06/21/2023 LORISA NAQVI [id number] 1946/07/19   HISTORY OF PRESENT ILLNESS:  This is a 77 year old female with past medical history significant for end-stage renal disease was on dialysis since 2014 status post renal cell transplant 2017, CMV infection, cholelithiasis, diverticulosis, fatty liver, GERD, B12 deficiency, atrial fibrillation diagnosed 11/2022 on Eliquis 5 mg twice daily.  Was admitted to the hospital for diverticular bleed.  Seen by our service.  Hemoglobin trended down from 13 g to about 8.5 g.  Was not transfused.  Eliquis was held.  CTA was negative.  Discharged 10/31.  Repeat hemoglobin last week by her PCP was 9.7 g.  Eliquis was restarted on 11/8.  No further bleeding.  Is using hydrocortisone cream for her hemorrhoids.    03/10/2023 colonoscopy with Dr. Marina Goodell for FOBT positive, rectal bleeding bowel prep was good.  25 mm pedunculated tubular adenomatous polyp descending colon removed with hot snare, diverticula throughout the entire colon, internal hemorrhoids.  Recall 03/2026   Past Medical History:  Diagnosis Date   Anemia    when I was on dyalysis   Anxiety    Arthritis    Cataract    Cholelithiasis 04/2021   noted on CT abd/pelv done for R lower abd swelling and MR abd   Chronic renal insufficiency, stage III (moderate) (HCC) 10/2015   Post-transplant baseline sCr 1.2-1.4.     CMV infection [facility]) summer 2017   Valcyte per ID/Renal transplant team   Depression    Diverticulosis    a. 05/2012 colonoscopy   Erosive lichen planus of vulva    Topical steroids (managed by Dr. Robby Sermon [facility] via [facility] out[facility].   ESRD (end stage renal disease) (HCC)    began dialysis 2014--followed by Dr. Lowell Guitar.  Received deceased donor kidney transplant 10/2015.   Fatty liver 10/2021   with hepatomegaly   FSGS (focal segmental glomerulosclerosis)    right kidney; hx of left renal cell cancer and got nephrectomy 1993.   GERD (gastroesophageal  reflux disease)    Gout    Heart murmur    (Diastolic) ECHO 08/2013 showed that this murmur is coming from pt's R arm AV fistula   Hiatal hernia    History of renal cell cancer 1993   Left nephrectomy   Hyperlipidemia    Hypertension    Hypothyroidism    Impingement syndrome of left shoulder 04/2016   Dr. Bertram Savin to PT   Inguinal hernia    right   Kidney cancer, primary, with metastasis from kidney to other site [facility])    Lumbar spinal stenosis    Chronic LBP with bilat neurogenic claudication   Metatarsal fracture, pathologic 01/2018   Right; orthocarolina-->post op shoe continued, f/u x-ray planned.   Obesity    Osteoarthritis of left knee 08/2017   severe, diffuse, tricompartmental.  Responded to steroid injection.     Osteoporosis 06/07/2016   DEXA T-score of -3.1.  Fosamax planned 2017 but dental work prevented start..  Pathological toe fx 12/2017--repeat DEXA 08/2018, T-score -3.3 radius. 12/2020 T score radius -3.7.   PAF (paroxysmal atrial fibrillation) (HCC)    dx 11/2022   Pneumonia    Renal transplant recipient 11/06/2015   Baseline Cr as of 09/2020 1.2-1.4   Simple hepatic cyst    2022   Solitary kidney, acquired    SVT (supraventricular tachycardia) (HCC)    UTI (urinary tract infection)    Ventral hernia    Vitamin B12 deficiency 12/2018  Starting replacement as of 12/11/2018   Past Surgical History:  Procedure Laterality Date   AV FISTULA PLACEMENT Right    Right arm: aneurismal dilatation 2017 being followed by CV surgeons   CARDIOVASCULAR STRESS TEST     03/10/15 ETT ([facility]): Exercise ECG negative at 83% max predicted HR.    CATARACT EXTRACTION     COLONOSCOPY  2003; 05/2012   2013 diverticulosis, no polyps.  03/10/23 ->one adenoma and extensive diverticulosis as well as internal hemorrhoids   colonoscopy with polypectomy     Dr Marina Goodell   DEXA  06/07/2016; 08/22/2018; 12/30/20   2017 T-score -3.1.  2020 T score -3.3.  2022 T score -3.7    HYSTERECTOMY ABDOMINAL WITH SALPINGECTOMY     KIDNEY TRANSPLANT  November 27, 2015   Deceased donor kidney transplant, with Thymoglobulin induction   LIGATION OF ARTERIOVENOUS  FISTULA Right 02/14/2017   Procedure: LIGATION OF ARTERIOVENOUS  FISTULA;  Surgeon: Chuck Hint, MD;  Location[facility] OR;  Service: Vascular;  Laterality: Right;   NEPHRECTOMY  1993   for malignancy- left   RENAL BIOPSY     right   RESECTION OF ARTERIOVENOUS FISTULA ANEURYSM Right 02/14/2017   Procedure: EXCISION OF RIGHT BRACHIOCEPHALIC ARTERIOVENOUS FISTULA ANEURYSM;  Surgeon: Chuck Hint, MD;  Location: [facility] OR;  Service: Vascular;  Laterality: Right;   TEE WITHOUT CARDIOVERSION N/A 08/27/2013   Procedure: TRANSESOPHAGEAL ECHOCARDIOGRAM (TEE);  Surgeon: Wendall Stade, MD;  Location: [facility] ENDOSCOPY;  Service: Cardiovascular;  Laterality: N/A;   TOTAL ABDOMINAL HYSTERECTOMY W/ BILATERAL SALPINGOOPHORECTOMY  1997   fibroids   TRANSTHORACIC ECHOCARDIOGRAM     03/10/15 echo ([facility]): LV cavity normal in size, focal basal hypertrophy, normal systolic function, EF 55% (visual est). Normal wall motion, no regional wall motion abnormalities. Mild diastolic dysfunction with normal LA chamber size. No significant valve stenosis or regurgitation.  12/2022 normal   VULVA / PERINEUM BIOPSY  2015    reports that she has never smoked. She has never used smokeless tobacco. She reports current alcohol use. She reports that she does not use drugs. family history includes Cancer in her paternal aunt and sister; Deep vein thrombosis in her mother; Diabetes in her maternal grandfather; Fibroids in her daughter; Heart attack (age of onset: 54) in her father; Hypertension in her father and mother; Stroke in her paternal grandmother. Allergies  Allergen Reactions   Labetalol     Patient couldn't walk, low BP   Ceclor [Cefaclor] Rash   Naproxen Rash   Enalapril Maleate Cough    Vasotec   Metoprolol Tartrate  Rash    On legs      Outpatient Encounter Medications as of 06/21/2023  Medication Sig   acetaminophen (TYLENOL) 500 MG tablet Take 1,000 mg by mouth every 8 (eight) hours as needed for moderate pain.   apixaban (ELIQUIS) 5 MG TABS tablet Take 1 tablet (5 mg total) by mouth 2 (two) times daily.   AYR SALINE NASAL DROPS NA Place 2 sprays into both nostrils daily.   Camphor-Eucalyptus-Menthol (VICKS VAPORUB EX) Apply 1 application topically daily.   Cyanocobalamin (B-12 PO) Take 1 tablet by mouth daily.   doxazosin (CARDURA) 2 MG tablet TAKE 1 TABLET(2 MG) BY MOUTH TWICE DAILY (Patient taking differently: Take 2 mg by mouth 2 (two) times daily.)   ferrous sulfate 325 (65 FE) MG EC tablet Take 325 mg by mouth daily with breakfast.   hydrocortisone (ANUSOL-HC) 2.5 % rectal cream Place 1 Application rectally  2 (two) times daily.   levothyroxine (SYNTHROID) 125 MCG tablet 1/2 tab po on two days a week, whole tab all other days (Patient taking differently: Take 125 mcg by mouth See admin instructions. 1 tablet 6 days a week, no tablets one day 7)   LORazepam (ATIVAN) 0.5 MG tablet Take 1 tablet (0.5 mg total) by mouth 2 (two) times daily as needed for anxiety.   losartan (COZAAR) 100 MG tablet TAKE 1 TABLET BY MOUTH DAILY   magnesium chloride (SLOW-MAG) 64 MG TBEC SR tablet Take 2 tablets (128 mg total) by mouth daily.   mycophenolate (MYFORTIC) 360 MG TBEC EC tablet Take 360 mg by mouth 2 (two) times daily.   nystatin powder Apply 1 Application topically once a week.   polyethylene glycol (MIRALAX / GLYCOLAX) 17 g packet Take 17 g by mouth daily.   silver sulfADIAZINE (SILVADENE) 1 % cream Apply 1 Application topically daily.   sodium bicarbonate 650 MG tablet Take 1 tablet (650 mg total) by mouth 2 (two) times daily. Takes 1 in AM and 1 at bedtime   tacrolimus (PROGRAF) 1 MG capsule Take 2 mg by mouth 2 (two) times daily.   triamcinolone cream (KENALOG) 0.1 % Apply 1 application topically daily.    verapamil (CALAN-SR) 120 MG CR tablet TAKE 1 TABLET BY MOUTH EVERY  AFTERNOON (Patient taking differently: Take 120 mg by mouth daily.)   verapamil (CALAN-SR) 240 MG CR tablet TAKE 1 TABLET BY MOUTH EVERY  MORNING   Vitamin D, Ergocalciferol, (DRISDOL) 1.25 MG (50000 UNIT) CAPS capsule Take 1 capsule (50,000 Units total) by mouth 2 (two) times a week.   No facility-administered encounter medications on file as of 06/21/2023.     REVIEW OF SYSTEMS  : All other systems reviewed and negative except where noted in the History of Present Illness.   PHYSICAL EXAM: BP 124/70   Pulse 88   Ht 5\' 5"  (1.651 m)   Wt 248 lb (112.5 kg)   BMI 41.27 kg/m  General: Well developed white female in no acute distress Head: Normocephalic and atraumatic Eyes:  Sclerae anicteric, conjunctiva pink. Ears: Normal auditory acuity Lungs: Clear throughout to auscultation; no W/R/R. Heart: Regular rate and rhythm; no M/R/G. Musculoskeletal: Symmetrical with no gross deformities  Skin: No lesions on visible extremities Neurological: Alert oriented x 4, grossly non-focal Psychological:  Alert and cooperative. Normal mood and affect  ASSESSMENT AND PLAN: Diverticular bleed on Eliquis Hgb 13 -->10.4--->9.0---> 8.6-->9.0-->9.0-->8.6-->8.5.  Did not receive transfusion.  Neg CTA 10/14.  Hgb 9.7 grams when rechecked last week.  No recurrent bleeding.  Try to keep stools soft, prevent straining.  Repeat labs per PCP follow-up and can follow-up here as needed in regards to this issue.   Significant colon polyp s/p recent polypectomy 03/10/2023 (Bx-tubular adenoma).  Also had pancolonic diverticulosis.  -Recall colon 03/2026 (Dr Marina Goodell).   Hemorrhoids:  Using hydrocortisone cream.   A-fib (on Eliquis):  Restarted on 11/8.     CC:  McGowen, Maryjean Morn, MD

## 2023-06-27 ENCOUNTER — Other Ambulatory Visit (HOSPITAL_BASED_OUTPATIENT_CLINIC_OR_DEPARTMENT_OTHER): Payer: Self-pay | Admitting: Physician Assistant

## 2023-06-27 DIAGNOSIS — I480 Paroxysmal atrial fibrillation: Secondary | ICD-10-CM | POA: Diagnosis not present

## 2023-06-27 DIAGNOSIS — E039 Hypothyroidism, unspecified: Secondary | ICD-10-CM | POA: Diagnosis not present

## 2023-06-27 DIAGNOSIS — I4819 Other persistent atrial fibrillation: Secondary | ICD-10-CM | POA: Diagnosis not present

## 2023-06-27 DIAGNOSIS — D6869 Other thrombophilia: Secondary | ICD-10-CM | POA: Diagnosis not present

## 2023-06-28 LAB — HEMOGLOBIN AND HEMATOCRIT, BLOOD
Hematocrit: 31.6 % — ABNORMAL LOW (ref 34.0–46.6)
Hemoglobin: 9.5 g/dL — ABNORMAL LOW (ref 11.1–15.9)

## 2023-06-29 DIAGNOSIS — Z940 Kidney transplant status: Secondary | ICD-10-CM | POA: Diagnosis not present

## 2023-06-29 DIAGNOSIS — N051 Unspecified nephritic syndrome with focal and segmental glomerular lesions: Secondary | ICD-10-CM | POA: Diagnosis not present

## 2023-06-29 DIAGNOSIS — K769 Liver disease, unspecified: Secondary | ICD-10-CM | POA: Diagnosis not present

## 2023-07-02 ENCOUNTER — Other Ambulatory Visit: Payer: Self-pay | Admitting: Family Medicine

## 2023-07-03 ENCOUNTER — Other Ambulatory Visit: Payer: Medicare Other

## 2023-07-03 ENCOUNTER — Ambulatory Visit: Payer: Medicare Other

## 2023-07-03 ENCOUNTER — Ambulatory Visit
Admission: RE | Admit: 2023-07-03 | Discharge: 2023-07-03 | Disposition: A | Discharge status: Home / Self Care | Payer: Medicare Other | Source: Ambulatory Visit | Attending: Family Medicine | Admitting: Family Medicine

## 2023-07-03 DIAGNOSIS — R92321 Mammographic fibroglandular density, right breast: Secondary | ICD-10-CM | POA: Diagnosis not present

## 2023-07-03 DIAGNOSIS — R928 Other abnormal and inconclusive findings on diagnostic imaging of breast: Secondary | ICD-10-CM

## 2023-07-04 DIAGNOSIS — I10 Essential (primary) hypertension: Secondary | ICD-10-CM | POA: Diagnosis not present

## 2023-07-04 DIAGNOSIS — K769 Liver disease, unspecified: Secondary | ICD-10-CM | POA: Diagnosis not present

## 2023-07-04 DIAGNOSIS — I4891 Unspecified atrial fibrillation: Secondary | ICD-10-CM | POA: Diagnosis not present

## 2023-07-04 DIAGNOSIS — Z940 Kidney transplant status: Secondary | ICD-10-CM | POA: Diagnosis not present

## 2023-07-04 DIAGNOSIS — K469 Unspecified abdominal hernia without obstruction or gangrene: Secondary | ICD-10-CM | POA: Diagnosis not present

## 2023-07-04 DIAGNOSIS — B259 Cytomegaloviral disease, unspecified: Secondary | ICD-10-CM | POA: Diagnosis not present

## 2023-07-04 DIAGNOSIS — K760 Fatty (change of) liver, not elsewhere classified: Secondary | ICD-10-CM | POA: Diagnosis not present

## 2023-07-04 DIAGNOSIS — D62 Acute posthemorrhagic anemia: Secondary | ICD-10-CM | POA: Diagnosis not present

## 2023-07-04 DIAGNOSIS — N2581 Secondary hyperparathyroidism of renal origin: Secondary | ICD-10-CM | POA: Diagnosis not present

## 2023-07-04 DIAGNOSIS — N051 Unspecified nephritic syndrome with focal and segmental glomerular lesions: Secondary | ICD-10-CM | POA: Diagnosis not present

## 2023-07-04 DIAGNOSIS — D849 Immunodeficiency, unspecified: Secondary | ICD-10-CM | POA: Diagnosis not present

## 2023-07-15 ENCOUNTER — Other Ambulatory Visit: Payer: Self-pay | Admitting: Family Medicine

## 2023-07-19 ENCOUNTER — Encounter: Payer: Self-pay | Admitting: Cardiology

## 2023-07-27 ENCOUNTER — Other Ambulatory Visit: Payer: Self-pay | Admitting: Family Medicine

## 2023-08-07 ENCOUNTER — Other Ambulatory Visit: Payer: Self-pay | Admitting: Family Medicine

## 2023-08-28 NOTE — Progress Notes (Signed)
CARDIOLOGY CONSULT NOTE       Patient ID: Tabitha Green MRN: [id number] DOB/AGE: 1946/06/22 78 y.o.  Referring Physician: McGowen Primary Physician: Jeoffrey Massed, MD Primary Cardiologist: Eden Emms Reason for Consultation: Arrhythmia/Palpitations    HPI:  78 y.o. history of renal transplant, HTN, HLD, hypothyroid and PAF. First seen by me 02/16/23 Seen by GT in 2015 for SVT Diagnosed with PAF 11/14/12 at [facility] Rx with eliquis for CHADVASC 5 and verapamil. She really cannot tell when she is out of rhythm. Seen in afib clinic 12/01/22 was in afib but 01/16/23 was in NSR  Monitor 01/08/23 showed NSR low P wave amplitude but no obvious PAF. Read by Dr Ladona Ridgel TTE 12/28/22 EF 60-65% moderate LAE No significant valve dx  AAT complicated by taking Prograf. Would only really be a candidate for Tikosyn or ablation. Given low burden PAF Afib clinic suggested no change and has not formally seen EP. Baseline Cr is normal 1.0 with K 3.9 on 12/29/22 Hct 40.6 and PLT 231 No bleeding issues   She is not rushing into AAT or ablation She is in NSR today   Sees Dr Marina Goodell GI. 06/05/23 Has had rectal bleeding with diverticulosis and hemorrhoids Taken off Eliquis Hb 8.5  Seen by cardiology PA 06/16/23 with fatigue Hb 9.7 No rectal bleeding eliquis resumed and note indicates referral to [facility] for possible Watchman TSH was very elevated 06/16/23 at 46.8 and notified primary to increase synthroid dose. Last Hb 9.5 on 06/27/23   Doing well no afib Discussed benefit of Watchman in her case and need for repeat labs Her husbands health is not well and she worries about being able to care for him   ROS All other systems reviewed and negative except as noted above  Past Medical History:  Diagnosis Date   Anemia    when I was on dyalysis   Anxiety    Arthritis    Cataract    Cholelithiasis 04/2021   noted on CT abd/pelv done for R lower abd swelling and MR abd   Chronic renal insufficiency, stage III (moderate)  (HCC) 10/2015   Post-transplant baseline sCr 1.2-1.4.     CMV infection [facility]) summer 2017   Valcyte per ID/Renal transplant team   Depression    Diverticulosis    a. 05/2012 colonoscopy   Erosive lichen planus of vulva    Topical steroids (managed by Dr. Robby Sermon [facility] via [facility] outpt services.   ESRD (end stage renal disease) (HCC)    began dialysis 2014--followed by Dr. Lowell Guitar.  Received deceased donor kidney transplant 10/2015.   Fatty liver 10/2021   with hepatomegaly   FSGS (focal segmental glomerulosclerosis)    right kidney; hx of left renal cell cancer and got nephrectomy 1993.   GERD (gastroesophageal reflux disease)    Gout    Heart murmur    (Diastolic) ECHO 08/2013 showed that this murmur is coming from pt's R arm AV fistula   Hiatal hernia    History of renal cell cancer 1993   Left nephrectomy   Hyperlipidemia    Hypertension    Hypothyroidism    Impingement syndrome of left shoulder 04/2016   Dr. Bertram Savin to PT   Inguinal hernia    right   Kidney cancer, primary, with metastasis from kidney to other site [facility])    Lumbar spinal stenosis    Chronic LBP with bilat neurogenic claudication   Metatarsal fracture, pathologic 01/2018   Right; orthocarolina-->post op shoe  continued, f/u x-ray planned.   Obesity    Osteoarthritis of left knee 08/2017   severe, diffuse, tricompartmental.  Responded to steroid injection.     Osteoporosis 06/07/2016   DEXA T-score of -3.1.  Fosamax planned 2017 but dental work prevented start..  Pathological toe fx 12/2017--repeat DEXA 08/2018, T-score -3.3 radius. 12/2020 T score radius -3.7.   PAF (paroxysmal atrial fibrillation) (HCC)    dx 11/2022   Pneumonia    Renal transplant recipient 11/06/2015   Baseline Cr as of 09/2020 1.2-1.4   Simple hepatic cyst    2022   Solitary kidney, acquired    SVT (supraventricular tachycardia) (HCC)    UTI (urinary tract infection)    Ventral hernia    Vitamin B12  deficiency 12/2018   Starting replacement as of 12/11/2018    Family History  Problem Relation Age of Onset   Deep vein thrombosis Mother        post thyroid surgery   Hypertension Mother    Heart attack Father 37       deceased   Hypertension Father    Cancer Sister        breast   Cancer Paternal Aunt        pancreatic   Diabetes Maternal Grandfather    Stroke Paternal Grandmother        in 51s   Fibroids Daughter    Colon cancer Neg Hx    Esophageal cancer Neg Hx    Stomach cancer Neg Hx    Rectal cancer Neg Hx    Sleep apnea Neg Hx     Social History   Socioeconomic History   Marital status: Married    Spouse name: Not on file   Number of children: 2   Years of education: Not on file   Highest education level: 12th grade  Occupational History   Occupation: Retired  Tobacco Use   Smoking status: Never   Smokeless tobacco: Never  Vaping Use   Vaping status: Never Used  Substance and Sexual Activity   Alcohol use: Yes    Comment: rarely   Drug use: No   Sexual activity: Not on file  Other Topics Concern   Not on file  Social History Narrative   Lives in [place] with husband.  Retired.  Previously worked in [employer] @ [employer].   No T/A/Ds.   Social Drivers of Corporate investment banker Strain: Low Risk  (06/13/2023)   Overall Financial Resource Strain (CARDIA)    Difficulty of Paying Living Expenses: Not hard at all  Food Insecurity: No Food Insecurity (06/13/2023)   Hunger Vital Sign    Worried About Running Out of Food in the Last Year: Never true    Ran Out of Food in the Last Year: Never true  Transportation Needs: No Transportation Needs (06/13/2023)   PRAPARE - Administrator, Civil Service (Medical): No    Lack of Transportation (Non-Medical): No  Physical Activity: Inactive (06/13/2023)   Exercise Vital Sign    Days of Exercise per Week: 0 days    Minutes of Exercise per Session: 20 min  Stress: Stress Concern Present (06/13/2023)    [employer] of Occupational Health - Occupational Stress Questionnaire    Feeling of Stress : Very much  Social Connections: Moderately Integrated (06/13/2023)   Social Connection and Isolation Panel [NHANES]    Frequency of Communication with Friends and Family: More than three times a week    Frequency  of Social Gatherings with Friends and Family: Three times a week    Attends Religious Services: 1 to 4 times per year    Active Member of Clubs or Organizations: No    Attends Banker Meetings: Never    Marital Status: Married  Catering manager Violence: Not At Risk (06/03/2023)   Humiliation, Afraid, Rape, and Kick questionnaire    Fear of Current or Ex-Partner: No    Emotionally Abused: No    Physically Abused: No    Sexually Abused: No    Past Surgical History:  Procedure Laterality Date   AV FISTULA PLACEMENT Right    Right arm: aneurismal dilatation 2017 being followed by CV surgeons   CARDIOVASCULAR STRESS TEST     03/10/15 ETT ([facility]): Exercise ECG negative at 83% max predicted HR.    CATARACT EXTRACTION     COLONOSCOPY  2003; 05/2012   2013 diverticulosis, no polyps.  03/10/23 ->one adenoma and extensive diverticulosis as well as internal hemorrhoids   colonoscopy with polypectomy     Dr Marina Goodell   DEXA  06/07/2016; 08/22/2018; 12/30/20   2017 T-score -3.1.  2020 T score -3.3.  2022 T score -3.7   HYSTERECTOMY ABDOMINAL WITH SALPINGECTOMY     KIDNEY TRANSPLANT  2015-11-29   Deceased donor kidney transplant, with Thymoglobulin induction   LIGATION OF ARTERIOVENOUS  FISTULA Right 02/14/2017   Procedure: LIGATION OF ARTERIOVENOUS  FISTULA;  Surgeon: Chuck Hint, MD;  Location[facility] OR;  Service: Vascular;  Laterality: Right;   NEPHRECTOMY  1993   for malignancy- left   RENAL BIOPSY     right   RESECTION OF ARTERIOVENOUS FISTULA ANEURYSM Right 02/14/2017   Procedure: EXCISION OF RIGHT BRACHIOCEPHALIC ARTERIOVENOUS FISTULA ANEURYSM;  Surgeon:  Chuck Hint, MD;  Location: [facility] OR;  Service: Vascular;  Laterality: Right;   TEE WITHOUT CARDIOVERSION N/A 08/27/2013   Procedure: TRANSESOPHAGEAL ECHOCARDIOGRAM (TEE);  Surgeon: Wendall Stade, MD;  Location: [facility] ENDOSCOPY;  Service: Cardiovascular;  Laterality: N/A;   TOTAL ABDOMINAL HYSTERECTOMY W/ BILATERAL SALPINGOOPHORECTOMY  1997   fibroids   TRANSTHORACIC ECHOCARDIOGRAM     03/10/15 echo ([facility]): LV cavity normal in size, focal basal hypertrophy, normal systolic function, EF 55% (visual est). Normal wall motion, no regional wall motion abnormalities. Mild diastolic dysfunction with normal LA chamber size. No significant valve stenosis or regurgitation.  12/2022 normal   VULVA / PERINEUM BIOPSY  2015      Current Outpatient Medications:    acetaminophen (TYLENOL) 500 MG tablet, Take 1,000 mg by mouth every 8 (eight) hours as needed for moderate pain., Disp: , Rfl:    apixaban (ELIQUIS) 5 MG TABS tablet, Take 1 tablet (5 mg total) by mouth 2 (two) times daily., Disp: 180 tablet, Rfl: 1   AYR SALINE NASAL DROPS NA, Place 2 sprays into both nostrils daily., Disp: , Rfl:    Camphor-Eucalyptus-Menthol (VICKS VAPORUB EX), Apply 1 application topically daily., Disp: , Rfl:    Cyanocobalamin (B-12 PO), Take 1 tablet by mouth daily., Disp: , Rfl:    doxazosin (CARDURA) 2 MG tablet, TAKE 1 TABLET(2 MG) BY MOUTH TWICE DAILY (Patient taking differently: Take 2 mg by mouth 2 (two) times daily.), Disp: 180 tablet, Rfl: 3   ferrous sulfate 325 (65 FE) MG EC tablet, Take 325 mg by mouth daily with breakfast., Disp: , Rfl:    hydrocortisone (ANUSOL-HC) 2.5 % rectal cream, Place 1 Application rectally 2 (two) times daily., Disp: 30  g, Rfl: 1   levothyroxine (SYNTHROID) 125 MCG tablet, TAKE 1 AND 1/2 TABLETS ON MONDAYS AND WEDNESDAYS AND FRIDAYS. 1 TABLET ALL OTHER DAYS, Disp: 40 tablet, Rfl: 1   LORazepam (ATIVAN) 0.5 MG tablet, Take 1 tablet (0.5 mg total) by mouth 2 (two)  times daily as needed for anxiety., Disp: 30 tablet, Rfl: 1   losartan (COZAAR) 100 MG tablet, TAKE 1 TABLET BY MOUTH DAILY, Disp: 90 tablet, Rfl: 1   magnesium chloride (SLOW-MAG) 64 MG TBEC SR tablet, Take 2 tablets (128 mg total) by mouth daily., Disp: 180 tablet, Rfl: 1   mycophenolate (MYFORTIC) 360 MG TBEC EC tablet, Take 360 mg by mouth 2 (two) times daily., Disp: , Rfl:    nystatin powder, Apply 1 Application topically once a week., Disp: , Rfl:    polyethylene glycol (MIRALAX / GLYCOLAX) 17 g packet, Take 17 g by mouth daily., Disp: 14 each, Rfl: 0   silver sulfADIAZINE (SILVADENE) 1 % cream, Apply 1 Application topically daily., Disp: , Rfl:    sodium bicarbonate 650 MG tablet, Take 1 tablet (650 mg total) by mouth 2 (two) times daily. Takes 1 in AM and 1 at bedtime, Disp: 180 tablet, Rfl: 3   tacrolimus (PROGRAF) 1 MG capsule, Take 2 mg by mouth 2 (two) times daily., Disp: , Rfl:    triamcinolone cream (KENALOG) 0.1 %, Apply 1 application topically daily., Disp: , Rfl:    verapamil (CALAN-SR) 120 MG CR tablet, TAKE 1 TABLET BY MOUTH EVERY  AFTERNOON (Patient taking differently: Take 120 mg by mouth daily.), Disp: 90 tablet, Rfl: 0   verapamil (CALAN-SR) 240 MG CR tablet, TAKE 1 TABLET BY MOUTH EVERY  MORNING, Disp: 90 tablet, Rfl: 1   Vitamin D, Ergocalciferol, (DRISDOL) 1.25 MG (50000 UNIT) CAPS capsule, TAKE 1 CAPSULE BY MOUTH 2 TIMES A WEEK, Disp: 24 capsule, Rfl: 0    Physical Exam: Blood pressure (!) 102/58, pulse 74, height 5\' 5"  (1.651 m), weight 243 lb 6.4 oz (110.4 kg), SpO2 96%.   Affect appropriate Healthy:  appears stated age HEENT: normal Neck supple with no adenopathy JVP normal no bruits no thyromegaly Lungs clear with no wheezing and good diaphragmatic motion Heart:  S1/S2 no murmur, no rub, gallop or click PMI normal Abdomen: benighn, post renal transplant  no bruit.  No HSM or HJR Distal pulses intact with no bruits No edema Neuro non-focal Skin warm and  dry No muscular weakness   Labs:   Lab Results  Component Value Date   WBC 6.7 06/13/2023   HGB 9.5 (L) 06/27/2023   HCT 31.6 (L) 06/27/2023   MCV 89.1 06/13/2023   PLT 331.0 06/13/2023   No results for input(s): "NA", "K", "CL", "CO2", "BUN", "CREATININE", "CALCIUM", "PROT", "BILITOT", "ALKPHOS", "ALT", "AST", "GLUCOSE" in the last 168 hours.  Invalid input(s): "LABALBU" Lab Results  Component Value Date   TROPONINI <0.30 12/08/2012    Lab Results  Component Value Date   CHOL 148 09/13/2022   CHOL 144 11/16/2021   CHOL 152 11/18/2019   Lab Results  Component Value Date   HDL 41.30 09/13/2022   HDL 40.30 11/16/2021   HDL 41 11/18/2019   Lab Results  Component Value Date   LDLCALC 79 09/13/2022   LDLCALC 79 11/16/2021   LDLCALC 90 11/18/2019   Lab Results  Component Value Date   TRIG 140.0 09/13/2022   TRIG 126.0 11/16/2021   TRIG 117 11/18/2019   Lab Results  Component Value Date  CHOLHDL 4 09/13/2022   CHOLHDL 4 11/16/2021   CHOLHDL 4 02/18/2019   Lab Results  Component Value Date   LDLDIRECT 166.3 10/02/2007      Radiology: No results found.  EKG: 01/16/23 SR rate 67 QT 410 msec and PR 240 msec   ASSESSMENT AND PLAN:   PAF:  low burden on eliquis for anticoagulation and verapamil as AV nodal drug EF normal no valve dx Moderate LAE. AAT complicated by use of Prograf  Referred to EP for ? [facility] Renal Transplant:  Cr normal 1.0 at baseline on Myfortic and Prograf  Thyroid:  Needs repeat TSH on higher dose synthroid TSH markedly elevated 06/16/23  HTN:  normal range on ARB and verapamil Anemia:  diverticulosis and hemorrhoids on colonoscopy 03/28/23 F/U Marina Goodell    TSH/T4 Hb/Hct Refer EP Watchman    Suggested coronary calcium score to risk stratify for CAD If she goes for Watchman This can be done with PV CTA prior to procedure  F/U me in a year   Signed: Charlton Haws 09/08/2023, 9:27 AM

## 2023-08-29 DIAGNOSIS — L304 Erythema intertrigo: Secondary | ICD-10-CM | POA: Diagnosis not present

## 2023-08-29 DIAGNOSIS — L821 Other seborrheic keratosis: Secondary | ICD-10-CM | POA: Diagnosis not present

## 2023-08-29 DIAGNOSIS — L438 Other lichen planus: Secondary | ICD-10-CM | POA: Diagnosis not present

## 2023-08-29 DIAGNOSIS — L853 Xerosis cutis: Secondary | ICD-10-CM | POA: Diagnosis not present

## 2023-09-08 ENCOUNTER — Ambulatory Visit: Payer: Medicare Other | Admitting: Cardiology

## 2023-09-08 ENCOUNTER — Ambulatory Visit: Payer: Medicare Other | Attending: Cardiovascular Disease | Admitting: Cardiovascular Disease

## 2023-09-08 ENCOUNTER — Other Ambulatory Visit: Payer: Self-pay

## 2023-09-08 ENCOUNTER — Ambulatory Visit: Payer: Medicare Other | Admitting: Student

## 2023-09-08 VITALS — BP 102/58 | HR 74 | Ht 65.0 in | Wt 243.4 lb

## 2023-09-08 DIAGNOSIS — D6869 Other thrombophilia: Secondary | ICD-10-CM

## 2023-09-08 DIAGNOSIS — I480 Paroxysmal atrial fibrillation: Secondary | ICD-10-CM

## 2023-09-08 DIAGNOSIS — E039 Hypothyroidism, unspecified: Secondary | ICD-10-CM | POA: Diagnosis not present

## 2023-09-08 DIAGNOSIS — I10 Essential (primary) hypertension: Secondary | ICD-10-CM

## 2023-09-08 NOTE — Patient Instructions (Addendum)
Medication Instructions:  Your physician recommends that you continue on your current medications as directed. Please refer to the Current Medication list given to you today.  *If you need a refill on your cardiac medications before your next appointment, please call your pharmacy*  Lab Work: Your physician recommends that you have lab work today- TSH/T4 and Hemoglobin and Hematocrit.  If you have labs (blood work) drawn today and your tests are completely normal, you will receive your results only by: MyChart Message (if you have MyChart) OR A paper copy in the mail If you have any lab test that is abnormal or we need to change your treatment, we will call you to review the results.  Testing/Procedures: None ordered today.  Follow-Up: At [facility], you and your health needs are our priority.  As part of our continuing mission to provide you with exceptional heart care, we have created designated Provider Care Teams.  These Care Teams include your primary Cardiologist (physician) and Advanced Practice Providers (APPs -  Physician Assistants and Nurse Practitioners) who all work together to provide you with the care you need, when you need it.  We recommend signing up for the patient portal called "MyChart".  Sign up information is provided on this After Visit Summary.  MyChart is used to connect with patients for Virtual Visits (Telemedicine).  Patients are able to view lab/test results, encounter notes, upcoming appointments, etc.  Non-urgent messages can be sent to your provider as well.   To learn more about what you can do with MyChart, go to [email or web address].    Your next appointment:   1 year(s)  Provider:   Charlton Haws, MD     Other Instructions  You may also go to any of these [facility] locations:   [facility] - [address] ([facility]) - [address] [phone] [address]   [place] - [address]    [place]  - [address]    [place] - [address] - [address] [employer]  - [address] [place] - [address] ([facility]

## 2023-09-09 ENCOUNTER — Telehealth: Payer: Self-pay

## 2023-09-09 ENCOUNTER — Ambulatory Visit
Admission: EM | Admit: 2023-09-09 | Discharge: 2023-09-09 | Disposition: A | Discharge status: Home / Self Care | Payer: Medicare Other | Attending: Nurse Practitioner | Admitting: Nurse Practitioner

## 2023-09-09 ENCOUNTER — Encounter: Payer: Self-pay | Admitting: Emergency Medicine

## 2023-09-09 DIAGNOSIS — J069 Acute upper respiratory infection, unspecified: Secondary | ICD-10-CM

## 2023-09-09 DIAGNOSIS — I4891 Unspecified atrial fibrillation: Secondary | ICD-10-CM | POA: Diagnosis not present

## 2023-09-09 DIAGNOSIS — Z7901 Long term (current) use of anticoagulants: Secondary | ICD-10-CM | POA: Diagnosis not present

## 2023-09-09 DIAGNOSIS — R059 Cough, unspecified: Secondary | ICD-10-CM | POA: Diagnosis not present

## 2023-09-09 DIAGNOSIS — R58 Hemorrhage, not elsewhere classified: Secondary | ICD-10-CM | POA: Diagnosis not present

## 2023-09-09 DIAGNOSIS — R0689 Other abnormalities of breathing: Secondary | ICD-10-CM | POA: Diagnosis not present

## 2023-09-09 DIAGNOSIS — Z743 Need for continuous supervision: Secondary | ICD-10-CM | POA: Diagnosis not present

## 2023-09-09 LAB — COMPREHENSIVE METABOLIC PANEL
Albumin: 3.6 (ref 3.5–5.0)
Calcium: 9.4 (ref 8.7–10.7)
Globulin: 3.4
eGFR: 50

## 2023-09-09 LAB — CBC AND DIFFERENTIAL
HCT: 39 (ref 36–46)
Hemoglobin: 11.8 — AB (ref 12.0–16.0)
Neutrophils Absolute: 3.34
Platelets: 238 10*3/uL (ref 150–400)
WBC: 4.9

## 2023-09-09 LAB — HEPATIC FUNCTION PANEL
ALT: 11 U/L (ref 7–35)
AST: 18 (ref 13–35)
Alkaline Phosphatase: 86 (ref 25–125)

## 2023-09-09 LAB — BASIC METABOLIC PANEL
BUN: 12 (ref 4–21)
CO2: 24 — AB (ref 13–22)
Chloride: 107 (ref 99–108)
Creatinine: 1.1 (ref 0.5–1.1)
Glucose: 126
Potassium: 3.8 meq/L (ref 3.5–5.1)
Sodium: 139 (ref 137–147)

## 2023-09-09 LAB — POC SARS CORONAVIRUS 2 AG -  ED: SARS Coronavirus 2 Ag: NEGATIVE

## 2023-09-09 LAB — CBC: RBC: 4.7 (ref 3.87–5.11)

## 2023-09-09 MED ORDER — BENZONATATE 100 MG PO CAPS
100.0000 mg | ORAL_CAPSULE | Freq: Three times a day (TID) | ORAL | 0 refills | Status: DC | PRN
Start: 2023-09-09 — End: 2024-03-14

## 2023-09-09 NOTE — Telephone Encounter (Signed)
Pt needing tesslon pearls rx sent to pharmacy, provider Bradly Bienenstock NP notified.

## 2023-09-09 NOTE — Discharge Instructions (Addendum)
You have a viral upper respiratory infection.  Symptoms should improve over the next week to 10 days.  If you develop chest pain or shortness of breath, go to the emergency room.  COVID-19 test is negative today.  Some things that can make you feel better are: - Increased rest - Increasing fluid with water/sugar free electrolytes - Acetaminophen as needed for fever/pain - OTC Coricidin products to help with congestion - Salt water gargling, chloraseptic spray and throat lozenges - OTC guaifenesin (Mucinex) 600 mg twice daily - Saline sinus flushes or a neti pot - Humidifying the air -Tessalon Perles every 8 hours as needed for dry cough

## 2023-09-09 NOTE — ED Triage Notes (Signed)
Patient c/o cough and sinus drainage x 3 days.  Denies any fever.  Nasal drainage and bilateral ear discomfort.  Patient has taken Tylenol.

## 2023-09-09 NOTE — Telephone Encounter (Signed)
 Tabitha Green sent to pharmacy

## 2023-09-09 NOTE — ED Provider Notes (Signed)
[facility]    CSN: [id number] Arrival date & time: 09/09/23  0800      History   Chief Complaint Chief Complaint  Patient presents with   Cough    HPI Tabitha Green is a 78 y.o. female.   Patient presents today with 2 to 3-day history of occasional congested cough, stuffy and runny nose, postnasal drainage, sinus pressure this morning when she woke up that resolved after Tylenol and blowing her nose.  She denies fever, body aches or chills, significant shortness of breath or chest pain, sore throat, headache, ear pain, abdominal pain, nausea/vomiting, diarrhea, or change in appetite.  Has taken Tylenol for symptoms which helped minimally.  Medical history significant for atrial fibrillation, status post kidney transplant.  She is compliant with medications and followed up with cardiology yesterday.    Past Medical History:  Diagnosis Date   Anemia    when I was on dyalysis   Anxiety    Arthritis    Cataract    Cholelithiasis 04/2021   noted on CT abd/pelv done for R lower abd swelling and MR abd   Chronic renal insufficiency, stage III (moderate) (HCC) 10/2015   Post-transplant baseline sCr 1.2-1.4.     CMV infection [facility]) summer 2017   Valcyte per ID/Renal transplant team   Depression    Diverticulosis    a. 05/2012 colonoscopy   Erosive lichen planus of vulva    Topical steroids (managed by Dr. Robby Sermon [facility] via [facility] outpt services.   ESRD (end stage renal disease) (HCC)    began dialysis 2014--followed by Dr. Lowell Guitar.  Received deceased donor kidney transplant 10/2015.   Fatty liver 10/2021   with hepatomegaly   FSGS (focal segmental glomerulosclerosis)    right kidney; hx of left renal cell cancer and got nephrectomy 1993.   GERD (gastroesophageal reflux disease)    Gout    Heart murmur    (Diastolic) ECHO 08/2013 showed that this murmur is coming from pt's R arm AV fistula   Hiatal hernia    History of renal cell cancer  1993   Left nephrectomy   Hyperlipidemia    Hypertension    Hypothyroidism    Impingement syndrome of left shoulder 04/2016   Dr. Bertram Savin to PT   Inguinal hernia    right   Kidney cancer, primary, with metastasis from kidney to other site [facility])    Lumbar spinal stenosis    Chronic LBP with bilat neurogenic claudication   Metatarsal fracture, pathologic 01/2018   Right; orthocarolina-->post op shoe continued, f/u x-ray planned.   Obesity    Osteoarthritis of left knee 08/2017   severe, diffuse, tricompartmental.  Responded to steroid injection.     Osteoporosis 06/07/2016   DEXA T-score of -3.1.  Fosamax planned 2017 but dental work prevented start..  Pathological toe fx 12/2017--repeat DEXA 08/2018, T-score -3.3 radius. 12/2020 T score radius -3.7.   PAF (paroxysmal atrial fibrillation) (HCC)    dx 11/2022   Pneumonia    Renal transplant recipient 11/06/2015   Baseline Cr as of 09/2020 1.2-1.4   Simple hepatic cyst    2022   Solitary kidney, acquired    SVT (supraventricular tachycardia) (HCC)    UTI (urinary tract infection)    Ventral hernia    Vitamin B12 deficiency 12/2018   Starting replacement as of 12/11/2018    Patient Active Problem List   Diagnosis Date Noted   Rectal bleeding 06/21/2023   Diverticulosis of  colon with hemorrhage 06/06/2023   ABLA (acute blood loss anemia) 06/04/2023   Lower GI bleed 06/02/2023   Persistent atrial fibrillation (HCC) 12/29/2022   PAF (paroxysmal atrial fibrillation) (HCC) 12/01/2022   Hypercoagulable state due to persistent atrial fibrillation (HCC) 12/01/2022   Combined forms of age-related cataract of right eye 10/20/2020   COVID-19 08/06/2020   Lumbar spinal stenosis 02/05/2019   Cherry angioma 01/14/2018   Lentigines 01/14/2018   Multiple benign nevi 01/14/2018   Pain in left knee 08/28/2017   Seborrheic keratosis 11/23/2016   Left shoulder pain 05/02/2016   Wheeze 09/16/2015   Cough 09/16/2015   Acute bronchitis  09/16/2015   History of renal carcinoma 05/14/2015   Vulvar ulceration 06/22/2014   End stage renal disease (HCC) 04/09/2014   Mechanical complication of other vascular device, implant, and graft 04/09/2014   Cervical spondylosis 10/10/2013   Aortic insufficiency 08/20/2013   Hyperglycemia 12/14/2012   SVT (supraventricular tachycardia) (HCC) 12/13/2012   Uremia 12/13/2012   Chronic kidney disease (CKD), stage IV (severe) (HCC) 12/07/2012   Bradycardia 12/07/2012   VITAMIN D DEFICIENCY 09/08/2009   CHRON GLOMERULONEPHRIT W/LES MEMBRANOUS GLN 09/08/2009   FATIGUE 09/08/2009   DIVERTICULOSIS, COLON 09/26/2008   OSTEOPENIA 09/26/2008   History of colonic polyps 09/26/2008   Hypothyroidism 10/02/2007   OTHER AND UNSPECIFIED HYPERLIPIDEMIA 10/02/2007   BENIGN POSITIONAL VERTIGO 10/02/2007   Unspecified essential hypertension 10/02/2007   Gout, unspecified 03/26/2007   DISORDER, DYSMETABOLIC SYNDROME X 03/26/2007   HX, PERSONAL, MALIGNANCY, KIDNEY 03/26/2007   OSTEOARTHRITIS 12/26/2006    Past Surgical History:  Procedure Laterality Date   AV FISTULA PLACEMENT Right    Right arm: aneurismal dilatation 2017 being followed by CV surgeons   CARDIOVASCULAR STRESS TEST     03/10/15 ETT ([facility]): Exercise ECG negative at 83% max predicted HR.    CATARACT EXTRACTION     COLONOSCOPY  2003; 05/2012   2013 diverticulosis, no polyps.  03/10/23 ->one adenoma and extensive diverticulosis as well as internal hemorrhoids   colonoscopy with polypectomy     Dr Marina Goodell   DEXA  06/07/2016; 08/22/2018; 12/30/20   2017 T-score -3.1.  2020 T score -3.3.  2022 T score -3.7   HYSTERECTOMY ABDOMINAL WITH SALPINGECTOMY     KIDNEY TRANSPLANT  12-Nov-2015   Deceased donor kidney transplant, with Thymoglobulin induction   LIGATION OF ARTERIOVENOUS  FISTULA Right 02/14/2017   Procedure: LIGATION OF ARTERIOVENOUS  FISTULA;  Surgeon: Chuck Hint, MD;  Location: [facility] OR;  Service: Vascular;  Laterality:  Right;   NEPHRECTOMY  1993   for malignancy- left   RENAL BIOPSY     right   RESECTION OF ARTERIOVENOUS FISTULA ANEURYSM Right 02/14/2017   Procedure: EXCISION OF RIGHT BRACHIOCEPHALIC ARTERIOVENOUS FISTULA ANEURYSM;  Surgeon: Chuck Hint, MD;  Location: [facility] OR;  Service: Vascular;  Laterality: Right;   TEE WITHOUT CARDIOVERSION N/A 08/27/2013   Procedure: TRANSESOPHAGEAL ECHOCARDIOGRAM (TEE);  Surgeon: Wendall Stade, MD;  Location: [facility] ENDOSCOPY;  Service: Cardiovascular;  Laterality: N/A;   TOTAL ABDOMINAL HYSTERECTOMY W/ BILATERAL SALPINGOOPHORECTOMY  1997   fibroids   TRANSTHORACIC ECHOCARDIOGRAM     03/10/15 echo ([facility]): LV cavity normal in size, focal basal hypertrophy, normal systolic function, EF 55% (visual est). Normal wall motion, no regional wall motion abnormalities. Mild diastolic dysfunction with normal LA chamber size. No significant valve stenosis or regurgitation.  12/2022 normal   VULVA / PERINEUM BIOPSY  2015    OB History  Gravida  2   Para  2   Term  2   Preterm  0   AB  0   Living  2      SAB  0   IAB  0   Ectopic  0   Multiple  0   Live Births               Home Medications    Prior to Admission medications   Medication Sig Start Date End Date Taking? Authorizing Provider  acetaminophen (TYLENOL) 500 MG tablet Take 1,000 mg by mouth every 8 (eight) hours as needed for moderate pain.   Yes [provider]  apixaban (ELIQUIS) 5 MG TABS tablet Take 1 tablet (5 mg total) by mouth 2 (two) times daily. 06/14/23  Yes Alwyn Ren, MD  AYR SALINE NASAL DROPS NA Place 2 sprays into both nostrils daily.   Yes [provider]  Camphor-Eucalyptus-Menthol (VICKS VAPORUB EX) Apply 1 application topically daily.   Yes [provider]  Cyanocobalamin (B-12 PO) Take 1 tablet by mouth daily.   Yes [provider]  doxazosin (CARDURA) 2 MG tablet TAKE 1 TABLET(2 MG) BY MOUTH  TWICE DAILY Patient taking differently: Take 2 mg by mouth 2 (two) times daily. 02/16/23  Yes McGowen, Maryjean Morn, MD  ferrous sulfate 325 (65 FE) MG EC tablet Take 325 mg by mouth daily with breakfast.   Yes [provider]  hydrocortisone (ANUSOL-HC) 2.5 % rectal cream Place 1 Application rectally 2 (two) times daily. 05/30/23  Yes Hilarie Fredrickson, MD  levothyroxine (SYNTHROID) 125 MCG tablet TAKE 1 AND 1/2 TABLETS ON MONDAYS AND WEDNESDAYS AND FRIDAYS. 1 TABLET ALL OTHER DAYS 07/27/23  Yes McGowen, Maryjean Morn, MD  LORazepam (ATIVAN) 0.5 MG tablet Take 1 tablet (0.5 mg total) by mouth 2 (two) times daily as needed for anxiety. 08/12/22  Yes McGowen, Maryjean Morn, MD  losartan (COZAAR) 100 MG tablet TAKE 1 TABLET BY MOUTH DAILY 05/08/23  Yes McGowen, Maryjean Morn, MD  magnesium chloride (SLOW-MAG) 64 MG TBEC SR tablet Take 2 tablets (128 mg total) by mouth daily. 05/11/23  Yes McGowen, Maryjean Morn, MD  mycophenolate (MYFORTIC) 360 MG TBEC EC tablet Take 360 mg by mouth 2 (two) times daily.   Yes [provider]  nystatin powder Apply 1 Application topically once a week.   Yes [provider]  polyethylene glycol (MIRALAX / GLYCOLAX) 17 g packet Take 17 g by mouth daily. 06/08/23  Yes Alwyn Ren, MD  silver sulfADIAZINE (SILVADENE) 1 % cream Apply 1 Application topically daily.   Yes [provider]  sodium bicarbonate 650 MG tablet Take 1 tablet (650 mg total) by mouth 2 (two) times daily. Takes 1 in AM and 1 at bedtime 08/25/22  Yes McGowen, Maryjean Morn, MD  tacrolimus (PROGRAF) 1 MG capsule Take 2 mg by mouth 2 (two) times daily.   Yes [provider]  triamcinolone cream (KENALOG) 0.1 % Apply 1 application topically daily.   Yes [provider]  verapamil (CALAN-SR) 120 MG CR tablet TAKE 1 TABLET BY MOUTH EVERY  AFTERNOON Patient taking differently: Take 120 mg by mouth daily. 04/11/23  Yes McGowen, Maryjean Morn, MD  verapamil (CALAN-SR) 240 MG CR tablet TAKE 1  TABLET BY MOUTH EVERY  MORNING 05/08/23  Yes McGowen, Maryjean Morn, MD  Vitamin D, Ergocalciferol, (DRISDOL) 1.25 MG (50000 UNIT) CAPS capsule TAKE 1 CAPSULE BY MOUTH 2 TIMES A WEEK 08/07/23  Yes  McGowen, Maryjean Morn, MD    Family History Family History  Problem Relation Age of Onset   Deep vein thrombosis Mother        post thyroid surgery   Hypertension Mother    Heart attack Father 67       deceased   Hypertension Father    Cancer Sister        breast   Cancer Paternal Aunt        pancreatic   Diabetes Maternal Grandfather    Stroke Paternal Grandmother        in 52s   Fibroids Daughter    Colon cancer Neg Hx    Esophageal cancer Neg Hx    Stomach cancer Neg Hx    Rectal cancer Neg Hx    Sleep apnea Neg Hx     Social History Social History   Tobacco Use   Smoking status: Never   Smokeless tobacco: Never  Vaping Use   Vaping status: Never Used  Substance Use Topics   Alcohol use: Yes    Comment: rarely   Drug use: No     Allergies   Labetalol, Ceclor [cefaclor], Naproxen, Enalapril maleate, and Metoprolol tartrate   Review of Systems Review of Systems Per HPI  Physical Exam Triage Vital Signs ED Triage Vitals  Encounter Vitals Group     BP 09/09/23 0811 (!) 146/72     Systolic BP Percentile --      Diastolic BP Percentile --      Pulse Rate 09/09/23 0811 83     Resp 09/09/23 0811 16     Temp 09/09/23 0811 99.2 F (37.3 C)     Temp Source 09/09/23 0811 Oral     SpO2 09/09/23 0811 94 %     Weight 09/09/23 0813 243 lb (110.2 kg)     Height 09/09/23 0813 5\' 5"  (1.651 m)     Head Circumference --      Peak Flow --      Pain Score 09/09/23 0813 0     Pain Loc --      Pain Education --      Exclude from Growth Chart --    No data found.  Updated Vital Signs BP (!) 146/72 (BP Location: Right Arm)   Pulse 83   Temp 99.2 F (37.3 C) (Oral)   Resp 16   Ht 5\' 5"  (1.651 m)   Wt 243 lb (110.2 kg)   SpO2 94%   BMI 40.44 kg/m   Visual Acuity Right Eye  Distance:   Left Eye Distance:   Bilateral Distance:    Right Eye Near:   Left Eye Near:    Bilateral Near:     Physical Exam Vitals and nursing note reviewed.  Constitutional:      General: She is not in acute distress.    Appearance: Normal appearance. She is not ill-appearing or toxic-appearing.  HENT:     Head: Normocephalic and atraumatic.     Right Ear: Tympanic membrane, ear canal and external ear normal.     Left Ear: Tympanic membrane, ear canal and external ear normal.     Nose: No congestion or rhinorrhea.     Mouth/Throat:     Mouth: Mucous membranes are moist.     Pharynx: Oropharynx is clear. No oropharyngeal exudate or posterior oropharyngeal erythema.  Eyes:     General: No scleral icterus.    Extraocular Movements: Extraocular movements intact.  Cardiovascular:  Rate and Rhythm: Normal rate and regular rhythm.  Pulmonary:     Effort: Pulmonary effort is normal. No respiratory distress.     Breath sounds: Normal breath sounds. No wheezing, rhonchi or rales.  Musculoskeletal:     Cervical back: Normal range of motion and neck supple.  Lymphadenopathy:     Cervical: No cervical adenopathy.  Skin:    General: Skin is warm and dry.     Coloration: Skin is not jaundiced or pale.     Findings: No erythema or rash.  Neurological:     Mental Status: She is alert and oriented to person, place, and time.  Psychiatric:        Behavior: Behavior is cooperative.      UC Treatments / Results  Labs (all labs ordered are listed, but only abnormal results are displayed) Labs Reviewed  POC SARS CORONAVIRUS 2 AG -  ED    EKG   Radiology No results found.  Procedures Procedures (including critical care time)  Medications Ordered in UC Medications - No data to display  Initial Impression / Assessment and Plan / UC Course  I have reviewed the triage vital signs and the nursing notes.  Pertinent labs & imaging results that were available during my care  of the patient were reviewed by me and considered in my medical decision making (see chart for details).   Patient is well-appearing, normotensive, afebrile, not tachycardic, not tachypneic, oxygenating well on room air.    1. Viral URI with cough Vitals and exam are reassuring today Suspect viral etiology Out of window for Tamiflu, flu testing deferred and low suspicion COVID-19 testing is negative Supportive care discussed including cough suppressant medication, guaifenesin, over-the-counter Coricidin products Return and ER precautions discussed with patient  The patient was given the opportunity to ask questions.  All questions answered to their satisfaction.  The patient is in agreement to this plan.    Final Clinical Impressions(s) / UC Diagnoses   Final diagnoses:  Viral URI with cough     Discharge Instructions      You have a viral upper respiratory infection.  Symptoms should improve over the next week to 10 days.  If you develop chest pain or shortness of breath, go to the emergency room.  COVID-19 test is negative today.  Some things that can make you feel better are: - Increased rest - Increasing fluid with water/sugar free electrolytes - Acetaminophen as needed for fever/pain - OTC Coricidin products to help with congestion - Salt water gargling, chloraseptic spray and throat lozenges - OTC guaifenesin (Mucinex) 600 mg twice daily - Saline sinus flushes or a neti pot - Humidifying the air -Tessalon Perles every 8 hours as needed for dry cough     ED Prescriptions   None    PDMP not reviewed this encounter.   Valentino Nose, NP 09/09/23 [phone]

## 2023-09-12 DIAGNOSIS — D6869 Other thrombophilia: Secondary | ICD-10-CM | POA: Diagnosis not present

## 2023-09-12 DIAGNOSIS — E039 Hypothyroidism, unspecified: Secondary | ICD-10-CM | POA: Diagnosis not present

## 2023-09-12 DIAGNOSIS — I480 Paroxysmal atrial fibrillation: Secondary | ICD-10-CM | POA: Diagnosis not present

## 2023-09-12 DIAGNOSIS — I10 Essential (primary) hypertension: Secondary | ICD-10-CM | POA: Diagnosis not present

## 2023-09-13 ENCOUNTER — Ambulatory Visit: Payer: Medicare Other | Admitting: *Deleted

## 2023-09-13 DIAGNOSIS — Z0000 Encounter for general adult medical examination without abnormal findings: Secondary | ICD-10-CM | POA: Diagnosis not present

## 2023-09-13 LAB — HEMOGLOBIN AND HEMATOCRIT, BLOOD
Hematocrit: 37.3 % (ref 34.0–46.6)
Hemoglobin: 11.4 g/dL (ref 11.1–15.9)

## 2023-09-13 LAB — TSH: TSH: 3.31 u[IU]/mL (ref 0.450–4.500)

## 2023-09-13 LAB — T4, FREE: Free T4: 1.8 ng/dL — ABNORMAL HIGH (ref 0.82–1.77)

## 2023-09-13 NOTE — Patient Instructions (Signed)
 [name] , Thank you for taking time to come for your Medicare Wellness Visit. I appreciate your ongoing commitment to your health goals. Please review the following plan we discussed and let me know if I can assist you in the future.   Screening recommendations/referrals: Colonoscopy: up to date Mammogram: Education provided Bone Density: up to date Recommended yearly ophthalmology/optometry visit for glaucoma screening and checkup Recommended yearly dental visit for hygiene and checkup  Vaccinations: Influenza vaccine: up to date Pneumococcal vaccine: up to date Tdap vaccine: up to date Shingles vaccine: Education provided    Advanced directives: yes up to date not on file    Preventive Care 65 Years and Older, Female Preventive care refers to lifestyle choices and visits with your health care provider that can promote health and wellness. What does preventive care include? A yearly physical exam. This is also called an annual well check. Dental exams once or twice a year. Routine eye exams. Ask your health care provider how often you should have your eyes checked. Personal lifestyle choices, including: Daily care of your teeth and gums. Regular physical activity. Eating a healthy diet. Avoiding tobacco and drug use. Limiting alcohol use. Practicing safe sex. Taking low-dose aspirin  every day. Taking vitamin and mineral supplements as recommended by your health care provider. What happens during an annual well check? The services and screenings done by your health care provider during your annual well check will depend on your age, overall health, lifestyle risk factors, and family history of disease. Counseling  Your health care provider may ask you questions about your: Alcohol use. Tobacco use. Drug use. Emotional well-being. Home and relationship well-being. Sexual activity. Eating habits. History of falls. Memory and ability to understand (cognition). Work and  work astronomer. Reproductive health. Screening  You may have the following tests or measurements: Height, weight, and BMI. Blood pressure. Lipid and cholesterol levels. These may be checked every 5 years, or more frequently if you are over 45 years old. Skin check. Lung cancer screening. You may have this screening every year starting at age 42 if you have a 30-pack-year history of smoking and currently smoke or have quit within the past 15 years. Fecal occult blood test (FOBT) of the stool. You may have this test every year starting at age 28. Flexible sigmoidoscopy or colonoscopy. You may have a sigmoidoscopy every 5 years or a colonoscopy every 10 years starting at age 63. Hepatitis C blood test. Hepatitis B blood test. Sexually transmitted disease (STD) testing. Diabetes screening. This is done by checking your blood sugar (glucose) after you have not eaten for a while (fasting). You may have this done every 1-3 years. Bone density scan. This is done to screen for osteoporosis. You may have this done starting at age 23. Mammogram. This may be done every 1-2 years. Talk to your health care provider about how often you should have regular mammograms. Talk with your health care provider about your test results, treatment options, and if necessary, the need for more tests. Vaccines  Your health care provider may recommend certain vaccines, such as: Influenza vaccine. This is recommended every year. Tetanus, diphtheria, and acellular pertussis (Tdap, Td) vaccine. You may need a Td booster every 10 years. Zoster vaccine. You may need this after age 37. Pneumococcal 13-valent conjugate (PCV13) vaccine. One dose is recommended after age 61. Pneumococcal polysaccharide (PPSV23) vaccine. One dose is recommended after age 41. Talk to your health care provider about which screenings and vaccines you  need and how often you need them. This information is not intended to replace advice given to you  by your health care provider. Make sure you discuss any questions you have with your health care provider. Document Released: 08/21/2015 Document Revised: 04/13/2016 Document Reviewed: 05/26/2015 Elsevier Interactive Patient Education  2017 [employer].  Fall Prevention in the Home Falls can cause injuries. They can happen to people of all ages. There are many things you can do to make your home safe and to help prevent falls. What can I do on the outside of my home? Regularly fix the edges of walkways and driveways and fix any cracks. Remove anything that might make you trip as you walk through a door, such as a raised step or threshold. Trim any bushes or trees on the path to your home. Use bright outdoor lighting. Clear any walking paths of anything that might make someone trip, such as rocks or tools. Regularly check to see if handrails are loose or broken. Make sure that both sides of any steps have handrails. Any raised decks and porches should have guardrails on the edges. Have any leaves, snow, or ice cleared regularly. Use sand or salt on walking paths during winter. Clean up any spills in your garage right away. This includes oil or grease spills. What can I do in the bathroom? Use night lights. Install grab bars by the toilet and in the tub and shower. Do not use towel bars as grab bars. Use non-skid mats or decals in the tub or shower. If you need to sit down in the shower, use a plastic, non-slip stool. Keep the floor dry. Clean up any water that spills on the floor as soon as it happens. Remove soap buildup in the tub or shower regularly. Attach bath mats securely with double-sided non-slip rug tape. Do not have throw rugs and other things on the floor that can make you trip. What can I do in the bedroom? Use night lights. Make sure that you have a light by your bed that is easy to reach. Do not use any sheets or blankets that are too big for your bed. They should not  hang down onto the floor. Have a firm chair that has side arms. You can use this for support while you get dressed. Do not have throw rugs and other things on the floor that can make you trip. What can I do in the kitchen? Clean up any spills right away. Avoid walking on wet floors. Keep items that you use a lot in easy-to-reach places. If you need to reach something above you, use a strong step stool that has a grab bar. Keep electrical cords out of the way. Do not use floor polish or wax that makes floors slippery. If you must use wax, use non-skid floor wax. Do not have throw rugs and other things on the floor that can make you trip. What can I do with my stairs? Do not leave any items on the stairs. Make sure that there are handrails on both sides of the stairs and use them. Fix handrails that are broken or loose. Make sure that handrails are as long as the stairways. Check any carpeting to make sure that it is firmly attached to the stairs. Fix any carpet that is loose or worn. Avoid having throw rugs at the top or bottom of the stairs. If you do have throw rugs, attach them to the floor with carpet tape. Make sure that  you have a light switch at the top of the stairs and the bottom of the stairs. If you do not have them, ask someone to add them for you. What else can I do to help prevent falls? Wear shoes that: Do not have high heels. Have rubber bottoms. Are comfortable and fit you well. Are closed at the toe. Do not wear sandals. If you use a stepladder: Make sure that it is fully opened. Do not climb a closed stepladder. Make sure that both sides of the stepladder are locked into place. Ask someone to hold it for you, if possible. Clearly mark and make sure that you can see: Any grab bars or handrails. First and last steps. Where the edge of each step is. Use tools that help you move around (mobility aids) if they are needed. These  include: Canes. Walkers. Scooters. Crutches. Turn on the lights when you go into a dark area. Replace any light bulbs as soon as they burn out. Set up your furniture so you have a clear path. Avoid moving your furniture around. If any of your floors are uneven, fix them. If there are any pets around you, be aware of where they are. Review your medicines with your doctor. Some medicines can make you feel dizzy. This can increase your chance of falling. Ask your doctor what other things that you can do to help prevent falls. This information is not intended to replace advice given to you by your health care provider. Make sure you discuss any questions you have with your health care provider. Document Released: 05/21/2009 Document Revised: 12/31/2015 Document Reviewed: 08/29/2014 Elsevier Interactive Patient Education  2017 [employer].

## 2023-09-13 NOTE — Progress Notes (Signed)
 Subjective:   Tabitha Green is a 78 y.o. female who presents for Medicare Annual (Subsequent) preventive examination.  Visit Complete: Virtual I connected with  Tabitha Green on 09/13/23 by a audio enabled telemedicine application and verified that I am speaking with the correct person using two identifiers.  Patient Location: Home  Provider Location: Home Office  I discussed the limitations of evaluation and management by telemedicine. The patient expressed understanding and agreed to proceed.  Vital Signs: Because this visit was a virtual/telehealth visit, some criteria may be missing or patient reported. Any vitals not documented were not able to be obtained and vitals that have been documented are patient reported.   Cardiac Risk Factors include: advanced age (>15men, >20 women)     Objective:    There were no vitals filed for this visit. There is no height or weight on file to calculate BMI.     09/13/2023   10:14 AM 06/02/2023    5:00 PM 11/20/2022   12:18 PM 09/14/2022   10:48 AM 07/28/2021    8:53 AM 07/22/2020    3:49 PM 06/17/2019    8:20 AM  Advanced Directives  Does Patient Have a Medical Advance Directive? Yes Yes No Yes Yes Yes Yes  Type of Advance Directive Living will;Healthcare Power of Diplomatic Services Operational Officer Power of [employer] Power of [place];Living will Living will;Healthcare Power of Attorney  Does patient want to make changes to medical advance directive?  No - Patient declined  No - Patient declined     Copy of Healthcare Power of Attorney in Chart? No - copy requested No - copy requested   No - copy requested No - copy requested No - copy requested    Current Medications (verified) Outpatient Encounter Medications as of 09/13/2023  Medication Sig   acetaminophen  (TYLENOL ) 500 MG tablet Take 1,000 mg by mouth every 8 (eight) hours as needed for moderate pain.   apixaban  (ELIQUIS ) 5 MG TABS tablet Take 1 tablet (5 mg  total) by mouth 2 (two) times daily.   AYR SALINE NASAL DROPS NA Place 2 sprays into both nostrils daily.   benzonatate  (TESSALON ) 100 MG capsule Take 1 capsule (100 mg total) by mouth 3 (three) times daily as needed for cough. Do not take with alcohol or while operating or driving heavy machinery   Camphor-Eucalyptus-Menthol (VICKS VAPORUB EX) Apply 1 application topically daily.   Cyanocobalamin  (B-12 PO) Take 1 tablet by mouth daily.   doxazosin  (CARDURA ) 2 MG tablet TAKE 1 TABLET(2 MG) BY MOUTH TWICE DAILY (Patient taking differently: Take 2 mg by mouth 2 (two) times daily.)   ferrous sulfate 325 (65 FE) MG EC tablet Take 325 mg by mouth daily with breakfast.   hydrocortisone  (ANUSOL -HC) 2.5 % rectal cream Place 1 Application rectally 2 (two) times daily.   levothyroxine  (SYNTHROID ) 125 MCG tablet TAKE 1 AND 1/2 TABLETS ON MONDAYS AND WEDNESDAYS AND FRIDAYS. 1 TABLET ALL OTHER DAYS   LORazepam  (ATIVAN ) 0.5 MG tablet Take 1 tablet (0.5 mg total) by mouth 2 (two) times daily as needed for anxiety.   losartan  (COZAAR ) 100 MG tablet TAKE 1 TABLET BY MOUTH DAILY   magnesium  chloride (SLOW-MAG) 64 MG TBEC SR tablet Take 2 tablets (128 mg total) by mouth daily.   mycophenolate  (MYFORTIC ) 360 MG TBEC EC tablet Take 360 mg by mouth 2 (two) times daily.   nystatin  powder Apply 1 Application topically once a week.   polyethylene  glycol (MIRALAX  / GLYCOLAX ) 17 g packet Take 17 g by mouth daily.   silver  sulfADIAZINE  (SILVADENE ) 1 % cream Apply 1 Application topically daily.   sodium bicarbonate  650 MG tablet Take 1 tablet (650 mg total) by mouth 2 (two) times daily. Takes 1 in AM and 1 at bedtime   tacrolimus  (PROGRAF ) 1 MG capsule Take 2 mg by mouth 2 (two) times daily.   triamcinolone  cream (KENALOG ) 0.1 % Apply 1 application topically daily.   verapamil  (CALAN -SR) 120 MG CR tablet TAKE 1 TABLET BY MOUTH EVERY  AFTERNOON (Patient taking differently: Take 120 mg by mouth daily.)   verapamil  (CALAN -SR)  240 MG CR tablet TAKE 1 TABLET BY MOUTH EVERY  MORNING   Vitamin D , Ergocalciferol , (DRISDOL ) 1.25 MG (50000 UNIT) CAPS capsule TAKE 1 CAPSULE BY MOUTH 2 TIMES A WEEK   No facility-administered encounter medications on file as of 09/13/2023.    Allergies (verified) Labetalol, Ceclor [cefaclor], Naproxen, Enalapril maleate, and Metoprolol tartrate   History: Past Medical History:  Diagnosis Date   Anemia    when I was on dyalysis   Anxiety    Arthritis    Cataract    Cholelithiasis 04/2021   noted on CT abd/pelv done for R lower abd swelling and MR abd   Chronic renal insufficiency, stage III (moderate) (HCC) 10/2015   Post-transplant baseline sCr 1.2-1.4.     CMV infection [facility]) summer 2017   Valcyte per ID/Renal transplant team   Depression    Diverticulosis    a. 05/2012 colonoscopy   Erosive lichen planus of vulva    Topical steroids (managed by Dr. Ricka KIDD [facility] via [facility] out[facility].   ESRD (end stage renal disease) (HCC)    began dialysis 2014--followed by Dr. Perri.  Received deceased donor kidney transplant 10/2015.   Fatty liver 10/2021   with hepatomegaly   FSGS (focal segmental glomerulosclerosis)    right kidney; hx of left renal cell cancer and got nephrectomy 1993.   GERD (gastroesophageal reflux disease)    Gout    Heart murmur    (Diastolic) ECHO 08/2013 showed that this murmur is coming from pt's R arm AV fistula   Hiatal hernia    History of renal cell cancer 1993   Left nephrectomy   Hyperlipidemia    Hypertension    Hypothyroidism    Impingement syndrome of left shoulder 04/2016   Dr. Marsa to PT   Inguinal hernia    right   Kidney cancer, primary, with metastasis from kidney to other site [facility])    Lumbar spinal stenosis    Chronic LBP with bilat neurogenic claudication   Metatarsal fracture, pathologic 01/2018   Right; orthocarolina-->post op shoe continued, f/u x-ray planned.   Obesity    Osteoarthritis  of left knee 08/2017   severe, diffuse, tricompartmental.  Responded to steroid injection.     Osteoporosis 06/07/2016   DEXA T-score of -3.1.  Fosamax  planned 2017 but dental work prevented start..  Pathological toe fx 12/2017--repeat DEXA 08/2018, T-score -3.3 radius. 12/2020 T score radius -3.7.   PAF (paroxysmal atrial fibrillation) (HCC)    dx 11/2022   Pneumonia    Renal transplant recipient 11/06/2015   Baseline Cr as of 09/2020 1.2-1.4   Simple hepatic cyst    2022   Solitary kidney, acquired    SVT (supraventricular tachycardia) (HCC)    UTI (urinary tract infection)    Ventral hernia    Vitamin B12 deficiency 12/2018  Starting replacement as of 12/11/2018   Past Surgical History:  Procedure Laterality Date   AV FISTULA PLACEMENT Right    Right arm: aneurismal dilatation 2017 being followed by CV surgeons   CARDIOVASCULAR STRESS TEST     03/10/15 ETT ([facility]): Exercise ECG negative at 83% max predicted HR.    CATARACT EXTRACTION     COLONOSCOPY  2003; 05/2012   2013 diverticulosis, no polyps.  03/10/23 ->one adenoma and extensive diverticulosis as well as internal hemorrhoids   colonoscopy with polypectomy     Dr Abran   DEXA  06/07/2016; 08/22/2018; 12/30/20   2017 T-score -3.1.  2020 T score -3.3.  2022 T score -3.7   HYSTERECTOMY ABDOMINAL WITH SALPINGECTOMY     KIDNEY TRANSPLANT  11-15-2015   Deceased donor kidney transplant, with Thymoglobulin induction   LIGATION OF ARTERIOVENOUS  FISTULA Right 02/14/2017   Procedure: LIGATION OF ARTERIOVENOUS  FISTULA;  Surgeon: Eliza Lonni RAMAN, MD;  Location: [facility] OR;  Service: Vascular;  Laterality: Right;   NEPHRECTOMY  1993   for malignancy- left   RENAL BIOPSY     right   RESECTION OF ARTERIOVENOUS FISTULA ANEURYSM Right 02/14/2017   Procedure: EXCISION OF RIGHT BRACHIOCEPHALIC ARTERIOVENOUS FISTULA ANEURYSM;  Surgeon: Eliza Lonni RAMAN, MD;  Location: [facility] OR;  Service: Vascular;  Laterality: Right;   TEE WITHOUT  CARDIOVERSION N/A 08/27/2013   Procedure: TRANSESOPHAGEAL ECHOCARDIOGRAM (TEE);  Surgeon: Maude JAYSON Emmer, MD;  Location: [facility] ENDOSCOPY;  Service: Cardiovascular;  Laterality: N/A;   TOTAL ABDOMINAL HYSTERECTOMY W/ BILATERAL SALPINGOOPHORECTOMY  1997   fibroids   TRANSTHORACIC ECHOCARDIOGRAM     03/10/15 echo ([facility]): LV cavity normal in size, focal basal hypertrophy, normal systolic function, EF 55% (visual est). Normal wall motion, no regional wall motion abnormalities. Mild diastolic dysfunction with normal LA chamber size. No significant valve stenosis or regurgitation.  12/2022 normal   VULVA / PERINEUM BIOPSY  2015   Family History  Problem Relation Age of Onset   Deep vein thrombosis Mother        post thyroid  surgery   Hypertension Mother    Heart attack Father 101       deceased   Hypertension Father    Cancer Sister        breast   Cancer Paternal Aunt        pancreatic   Diabetes Maternal Grandfather    Stroke Paternal Grandmother        in 14s   Fibroids Daughter    Colon cancer Neg Hx    Esophageal cancer Neg Hx    Stomach cancer Neg Hx    Rectal cancer Neg Hx    Sleep apnea Neg Hx    Social History   Socioeconomic History   Marital status: Married    Spouse name: Not on file   Number of children: 2   Years of education: Not on file   Highest education level: 12th grade  Occupational History   Occupation: Retired  Tobacco Use   Smoking status: Never   Smokeless tobacco: Never  Vaping Use   Vaping status: Never Used  Substance and Sexual Activity   Alcohol use: Yes    Comment: rarely   Drug use: No   Sexual activity: Not Currently  Other Topics Concern   Not on file  Social History Narrative   Lives in [place] with husband.  Retired.  Previously worked in [employer] @ [employer].   No T/A/Ds.  Social Drivers of Corporate Investment Banker Strain: Low Risk  (09/13/2023)   Overall Financial Resource Strain (CARDIA)    Difficulty  of Paying Living Expenses: Not hard at all  Food Insecurity: No Food Insecurity (09/13/2023)   Hunger Vital Sign    Worried About Running Out of Food in the Last Year: Never true    Ran Out of Food in the Last Year: Never true  Transportation Needs: No Transportation Needs (09/13/2023)   PRAPARE - Administrator, Civil Service (Medical): No    Lack of Transportation (Non-Medical): No  Physical Activity: Inactive (09/13/2023)   Exercise Vital Sign    Days of Exercise per Week: 0 days    Minutes of Exercise per Session: 0 min  Stress: No Stress Concern Present (09/13/2023)   [employer] of Occupational Health - Occupational Stress Questionnaire    Feeling of Stress : Not at all  Social Connections: Moderately Integrated (09/13/2023)   Social Connection and Isolation Panel [NHANES]    Frequency of Communication with Friends and Family: More than three times a week    Frequency of Social Gatherings with Friends and Family: More than three times a week    Attends Religious Services: 1 to 4 times per year    Active Member of [employer] or Organizations: No    Attends Banker Meetings: Never    Marital Status: Married    Tobacco Counseling Counseling given: Not Answered   Clinical Intake:  Pre-visit preparation completed: Yes  Pain : No/denies pain     Diabetes: No     Interpreter Needed?: No  Information entered by :: [name] Graff LPN   Activities of Daily Living    09/13/2023   10:24 AM 06/02/2023    5:00 PM  In your present state of health, do you have any difficulty performing the following activities:  Hearing? 0 0  Vision? 0 0  Difficulty concentrating or making decisions? 0 0  Walking or climbing stairs? 1   Dressing or bathing? 0   Doing errands, shopping? 0 0  Preparing Food and eating ? N   Using the Toilet? N   In the past six months, have you accidently leaked urine? N   Do you have problems with loss of bowel control? N   Managing your  Medications? N   Managing your Finances? N   Housekeeping or managing your Housekeeping? N     Patient Care Team: Candise Aleene DEL, MD as PCP - General (Family Medicine) Delford Maude BROCKS, MD as PCP - Cardiology (Cardiology) Cindie Ole DASEN, MD as PCP - Electrophysiology (Cardiology) Abran Norleen SAILOR, MD as Consulting Physician (Gastroenterology) Cleatrice Ludie SAUNDERS, MD as Consulting Physician (Sports Medicine) Harvey Carlin BRAVO, MD (Inactive) as Consulting Physician (Vascular Surgery) Eliza Lonni RAMAN, MD (Inactive) as Consulting Physician (Vascular Surgery) Steffani Garnette RIGGERS as Physician Assistant (Orthopedic Surgery) [facility], Ricka KIDD, MD as Consulting Physician (Dermatology) Stratton, [place]ne, PA-C as Consulting Physician (Orthopedic Surgery) Norine Manuelita LABOR, MD as Consulting Physician (Nephrology) Curtis Debby PARAS, MD as Consulting Physician (Family Medicine) Gennie Dover, MD as Referring Physician (Ophthalmology) Erskin Current, MD as Consulting Physician (Transplant) Delford Maude BROCKS, MD as Consulting Physician (Cardiology)  Indicate any recent Medical Services you may have received from other than [facility] providers in the past year (date may be approximate).     Assessment:   This is a routine wellness examination for Yazmyne.  Hearing/Vision screen Hearing Screening - Comments:: Does not wear  hearing aids Vision Screening - Comments:: Up to date [facility]   Goals Addressed             This Visit's Progress    Patient Stated   Not on track    Increase activity.     Patient Stated   Not on track    Drink more water     Patient Stated   Not on track    Lose weight      Patient Stated       Would like to stay mobile       Depression Screen    09/13/2023   10:19 AM 06/13/2023    9:37 AM 05/11/2023    8:24 AM 02/28/2023    9:59 AM 01/11/2023    1:21 PM 12/13/2022    9:59 AM 11/28/2022   10:23 AM  PHQ 2/9 Scores  PHQ - 2 Score 2 3 2 3 2 3 3    PHQ- 9 Score 3 7 8 8 7 9 12     Fall Risk    09/13/2023   10:13 AM 06/13/2023    9:02 AM 02/28/2023    9:59 AM 02/17/2023    1:45 PM 12/13/2022    9:59 AM  Fall Risk   Falls in the past year? 0 0 0 0 0  Number falls in past yr: 0    0  Injury with Fall? 0    0  Risk for fall due to :   No Fall Risks No Fall Risks History of fall(s)  Follow up Falls evaluation completed;Education provided;Falls prevention discussed  Falls evaluation completed Falls evaluation completed Falls evaluation completed    MEDICARE RISK AT HOME: Medicare Risk at Home Any stairs in or around the home?: Yes If so, are there any without handrails?: No Home free of loose throw rugs in walkways, pet beds, electrical cords, etc?: Yes Adequate lighting in your home to reduce risk of falls?: Yes Life alert?: No Use of a cane, walker or w/c?: No Grab bars in the bathroom?: No Shower chair or bench in shower?: Yes Elevated toilet seat or a handicapped toilet?: Yes  TIMED UP AND GO:  Was the test performed?  No    Cognitive Function:    06/17/2019    8:22 AM 06/11/2018    8:21 AM 06/06/2017    9:26 AM  MMSE - Mini Mental State Exam  Orientation to time 5 5 5   Orientation to Place 5 5 5   Registration 3 3 3   Attention/ Calculation 5 5 5   Recall 2 2 3   Language- name 2 objects 2 2 2   Language- repeat 1 1 1   Language- follow 3 step command 3 3 3   Language- read & follow direction 1 1 1   Write a sentence 1 1 1   Copy design 1 1 1   Total score 29 29 30         09/13/2023   10:15 AM 09/14/2022   10:52 AM 07/28/2021    8:57 AM  6CIT Screen  What Year? 0 points 0 points 0 points  What month? 0 points 0 points 0 points  What time? 0 points 0 points 0 points  Count back from 20 0 points 0 points 0 points  Months in reverse 0 points 0 points 2 points  Repeat phrase 2 points 0 points 0 points  Total Score 2 points 0 points 2 points    Immunizations Immunization History  Administered Date(s) Administered    Fluad Quad(high  Dose 65+) 05/21/2019, 06/02/2021, 06/06/2022   Fluad Trivalent(High Dose 65+) 05/11/2023   Influenza Split 06/01/2011   Influenza Whole 06/11/2008, 05/25/2009, 06/09/2010   Influenza, High Dose Seasonal PF 05/08/2013, 06/06/2017, 05/08/2020   Influenza-Unspecified 05/18/2015, 05/15/2018   PFIZER(Purple Top)SARS-COV-2 Vaccination 08/29/2019, 09/19/2019, 06/08/2020   PPD Test 06/01/2011   Pneumococcal Conjugate-13 06/06/2017   Pneumococcal Polysaccharide-23 01/06/2013   Td,absorbed, Preservative Free, Adult Use, Lf Unspecified 12/05/2021   Tdap 09/13/2011   Tetanus 12/05/2021   Varicella 12/24/2013   Zoster, Live 01/23/2014    TDAP status: Up to date  Flu Vaccine status: Up to date  Pneumococcal vaccine status: Up to date  Covid-19 vaccine status: Information provided on how to obtain vaccines.   Qualifies for Shingles Vaccine? Yes   Zostavax completed No   Shingrix  Completed?: No.    Education has been provided regarding the importance of this vaccine. Patient has been advised to call insurance company to determine out of pocket expense if they have not yet received this vaccine. Advised may also receive vaccine at local pharmacy or Health Dept. Verbalized acceptance and understanding.  Screening Tests Health Maintenance  Topic Date Due   Zoster Vaccines- Shingrix  (1 of 2) 08/29/1964   COVID-19 Vaccine (4 - 2024-25 season) 04/09/2023   Medicare Annual Wellness (AWV)  09/12/2024   Colonoscopy  03/09/2026   DTaP/Tdap/Td (4 - Td or Tdap) 12/06/2031   Pneumonia Vaccine 60+ Years old  Completed   INFLUENZA VACCINE  Completed   DEXA SCAN  Completed   Hepatitis C Screening  Completed   HPV VACCINES  Aged Out    Health Maintenance  Health Maintenance Due  Topic Date Due   Zoster Vaccines- Shingrix  (1 of 2) 08/29/1964   COVID-19 Vaccine (4 - 2024-25 season) 04/09/2023    Colorectal cancer screening: Type of screening: Colonoscopy. Completed 2024. Repeat every  3 years  Mammogram Education provided  Bone Density status: Completed 2022. Results reflect: Bone density results: OSTEOPOROSIS. Repeat every 2 years.  Lung Cancer Screening: (Low Dose CT Chest recommended if Age 13-80 years, 20 pack-year currently smoking OR have quit w/in 15years.) does not qualify.   Lung Cancer Screening Referral:   Additional Screening:  Hepatitis C Screening: does not qualify; Completed 2017  Vision Screening: Recommended annual ophthalmology exams for early detection of glaucoma and other disorders of the eye. Is the patient up to date with their annual eye exam?  Yes  Who is the provider or what is the name of the office in which the patient attends annual eye exams? [facility] If pt is not established with a provider, would they like to be referred to a provider to establish care? No .   Dental Screening: Recommended annual dental exams for proper oral hygiene    Community Resource Referral / Chronic Care Management: CRR required this visit?  No   CCM required this visit?  No     Plan:     I have personally reviewed and noted the following in the patient's chart:   Medical and social history Use of alcohol, tobacco or illicit drugs  Current medications and supplements including opioid prescriptions. Patient is not currently taking opioid prescriptions. Functional ability and status Nutritional status Physical activity Advanced directives List of other physicians Hospitalizations, surgeries, and ER visits in previous 12 months Vitals Screenings to include cognitive, depression, and falls Referrals and appointments  In addition, I have reviewed and discussed with patient certain preventive protocols, quality metrics, and best practice recommendations. A written  personalized care plan for preventive services as well as general preventive health recommendations were provided to patient.     [name] Graff, LPN   02/08/7973   After Visit Summary:  (MyChart) Due to this being a telephonic visit, the after visit summary with patients personalized plan was offered to patient via MyChart   Nurse Notes:

## 2023-09-14 NOTE — Patient Instructions (Signed)

## 2023-09-15 ENCOUNTER — Encounter: Payer: Self-pay | Admitting: Family Medicine

## 2023-09-15 ENCOUNTER — Ambulatory Visit (INDEPENDENT_AMBULATORY_CARE_PROVIDER_SITE_OTHER): Payer: Medicare Other | Admitting: Family Medicine

## 2023-09-15 VITALS — BP 130/76 | HR 84 | Ht 65.75 in | Wt 239.6 lb

## 2023-09-15 DIAGNOSIS — Z79899 Other long term (current) drug therapy: Secondary | ICD-10-CM

## 2023-09-15 DIAGNOSIS — Z0000 Encounter for general adult medical examination without abnormal findings: Secondary | ICD-10-CM

## 2023-09-15 DIAGNOSIS — I10 Essential (primary) hypertension: Secondary | ICD-10-CM | POA: Diagnosis not present

## 2023-09-15 DIAGNOSIS — E559 Vitamin D deficiency, unspecified: Secondary | ICD-10-CM

## 2023-09-15 DIAGNOSIS — Z7901 Long term (current) use of anticoagulants: Secondary | ICD-10-CM

## 2023-09-15 DIAGNOSIS — N1831 Chronic kidney disease, stage 3a: Secondary | ICD-10-CM

## 2023-09-15 DIAGNOSIS — I4891 Unspecified atrial fibrillation: Secondary | ICD-10-CM

## 2023-09-15 DIAGNOSIS — E039 Hypothyroidism, unspecified: Secondary | ICD-10-CM

## 2023-09-15 DIAGNOSIS — D508 Other iron deficiency anemias: Secondary | ICD-10-CM | POA: Diagnosis not present

## 2023-09-15 LAB — LIPID PANEL
Cholesterol: 130 mg/dL (ref 0–200)
HDL: 42.2 mg/dL (ref >39.00)
LDL Cholesterol: 62 mg/dL (ref 0–99)
NonHDL: 87.8
Total CHOL/HDL Ratio: 3
Triglycerides: 131 mg/dL (ref 0.0–149.0)
VLDL: 26.2 mg/dL (ref 0.0–40.0)

## 2023-09-15 LAB — VITAMIN D 25 HYDROXY (VIT D DEFICIENCY, FRACTURES): VITD: 37.9 ng/mL (ref 30.00–100.00)

## 2023-09-15 MED ORDER — LORAZEPAM 0.5 MG PO TABS: 0.5000 mg | ORAL_TABLET | Freq: Two times a day (BID) | ORAL | 1 refills | Status: DC | PRN

## 2023-09-15 NOTE — Progress Notes (Signed)
 Office Note 09/15/2023  CC:  Chief Complaint  Patient presents with   Annual Exam    Pt is fasting    HPI:  Patient is a 78 y.o. female who is here for annual health maintenance exam and 42-month follow-up hypertension, chronic renal insufficiency, and anxiety. A/P as of last visit: 1 anemia due to lower GI bleed.  Suspected to be hemorrhoidal bleeding. No further blood in stool since discharge home 8 days ago. She is still holding Eliquis  until further consultation with her cardiologist. She is set to see them in 3 days. Monitor CBC and iron panel today. Go ahead and start iron sulfate 325 mg once daily.   #2 PAF, chronic anticoagulation with Eliquis . Holding Eliquis  in the setting of #1 above. She has cardiology follow-up in 3 days to revisit this issue.   #3 chronic renal insufficiency stage III. History of renal transplant. Monitor renal function and electrolytes today. Continue current dosing of mycophenolate  and tacrolimus .   #4 hypothyroidism.  Most recent TSH about 1 month ago was slightly elevated. Adjusted levothyroxine  dose just a little bit: Take a whole 125 mcg tab 6 days a week and a half tab 1 day a week. Plan recheck TSH at next follow-up in February.  INTERIM HX: She was diagnosed with influenza A on 09/09/2023 in the emergency department. Hemoglobin 11.8, MCV 83.  White blood cell and platelets normal. Complete metabolic panel entirely normal except serum creatinine 1.12 (GFR 50). Chest x-ray normal. She was prescribed Tamiflu but did not get dispensed until 3 d/a so she took one dose and stopped. Cough improved, no hemoptysis.  No fever, CP, or SOB.    PMP AWARE reviewed today: most recent rx for lorazepam  was filled 12/23/2022, # 30, rx by me. No red flags.  Past Medical History:  Diagnosis Date   Anemia    when I was on dyalysis   Anxiety    Arthritis    Cataract    Cholelithiasis 04/2021   noted on CT abd/pelv done for R lower abd swelling  and MR abd   Chronic renal insufficiency, stage III (moderate) (HCC) 10/2015   Post-transplant baseline sCr 1.2-1.4.     CMV infection [facility]) summer 2017   Valcyte per ID/Renal transplant team   Depression    Diverticulosis    a. 05/2012 colonoscopy   Erosive lichen planus of vulva    Topical steroids (managed by Dr. Ricka KIDD [facility] via [facility] out[facility].   ESRD (end stage renal disease) (HCC)    began dialysis 2014--followed by Dr. Perri.  Received deceased donor kidney transplant 10/2015.   Fatty liver 10/2021   with hepatomegaly   FSGS (focal segmental glomerulosclerosis)    right kidney; hx of left renal cell cancer and got nephrectomy 1993.   GERD (gastroesophageal reflux disease)    Gout    Heart murmur    (Diastolic) ECHO 08/2013 showed that this murmur is coming from pt's R arm AV fistula   Hiatal hernia    History of renal cell cancer 1993   Left nephrectomy   Hyperlipidemia    Hypertension    Hypothyroidism    Impingement syndrome of left shoulder 04/2016   Dr. Marsa to PT   Inguinal hernia    right   Kidney cancer, primary, with metastasis from kidney to other site [facility])    Lumbar spinal stenosis    Chronic LBP with bilat neurogenic claudication   Metatarsal fracture, pathologic 01/2018  Right; orthocarolina-->post op shoe continued, f/u x-ray planned.   Obesity    Osteoarthritis of left knee 08/2017   severe, diffuse, tricompartmental.  Responded to steroid injection.     Osteoporosis 06/07/2016   DEXA T-score of -3.1.  Fosamax  planned 2017 but dental work prevented start..  Pathological toe fx 12/2017--repeat DEXA 08/2018, T-score -3.3 radius. 12/2020 T score radius -3.7.   PAF (paroxysmal atrial fibrillation) (HCC)    dx 11/2022   Pneumonia    Renal transplant recipient 20-Nov-2015   Baseline Cr as of 09/2020 1.2-1.4   Simple hepatic cyst    2022   Solitary kidney, acquired    SVT (supraventricular tachycardia) (HCC)     UTI (urinary tract infection)    Ventral hernia    Vitamin B12 deficiency 12/2018   Starting replacement as of 12/11/2018    Past Surgical History:  Procedure Laterality Date   AV FISTULA PLACEMENT Right    Right arm: aneurismal dilatation 2017 being followed by CV surgeons   CARDIOVASCULAR STRESS TEST     03/10/15 ETT ([facility]): Exercise ECG negative at 83% max predicted HR.    CATARACT EXTRACTION     COLONOSCOPY  2003; 05/2012   2013 diverticulosis, no polyps.  03/10/23 ->one adenoma and extensive diverticulosis as well as internal hemorrhoids   colonoscopy with polypectomy     Dr Abran   DEXA  06/07/2016; 08/22/2018; 12/30/20   2017 T-score -3.1.  2020 T score -3.3.  2022 T score -3.7   HYSTERECTOMY ABDOMINAL WITH SALPINGECTOMY     KIDNEY TRANSPLANT  11-20-15   Deceased donor kidney transplant, with Thymoglobulin induction   LIGATION OF ARTERIOVENOUS  FISTULA Right 02/14/2017   Procedure: LIGATION OF ARTERIOVENOUS  FISTULA;  Surgeon: Eliza Lonni RAMAN, MD;  Location: [facility] OR;  Service: Vascular;  Laterality: Right;   NEPHRECTOMY  1993   for malignancy- left   RENAL BIOPSY     right   RESECTION OF ARTERIOVENOUS FISTULA ANEURYSM Right 02/14/2017   Procedure: EXCISION OF RIGHT BRACHIOCEPHALIC ARTERIOVENOUS FISTULA ANEURYSM;  Surgeon: Eliza Lonni RAMAN, MD;  Location: [facility] OR;  Service: Vascular;  Laterality: Right;   TEE WITHOUT CARDIOVERSION N/A 08/27/2013   Procedure: TRANSESOPHAGEAL ECHOCARDIOGRAM (TEE);  Surgeon: Maude JAYSON Emmer, MD;  Location: [facility] ENDOSCOPY;  Service: Cardiovascular;  Laterality: N/A;   TOTAL ABDOMINAL HYSTERECTOMY W/ BILATERAL SALPINGOOPHORECTOMY  1997   fibroids   TRANSTHORACIC ECHOCARDIOGRAM     03/10/15 echo ([facility]): LV cavity normal in size, focal basal hypertrophy, normal systolic function, EF 55% (visual est). Normal wall motion, no regional wall motion abnormalities. Mild diastolic dysfunction with normal LA chamber size. No  significant valve stenosis or regurgitation.  12/2022 normal   VULVA / PERINEUM BIOPSY  2015    Family History  Problem Relation Age of Onset   Deep vein thrombosis Mother        post thyroid  surgery   Hypertension Mother    Heart attack Father 76       deceased   Hypertension Father    Cancer Sister        breast   Cancer Paternal Aunt        pancreatic   Diabetes Maternal Grandfather    Stroke Paternal Grandmother        in 98s   Fibroids Daughter    Colon cancer Neg Hx    Esophageal cancer Neg Hx    Stomach cancer Neg Hx    Rectal cancer Neg Hx  Sleep apnea Neg Hx     Social History   Socioeconomic History   Marital status: Married    Spouse name: Not on file   Number of children: 2   Years of education: Not on file   Highest education level: 12th grade  Occupational History   Occupation: Retired  Tobacco Use   Smoking status: Never   Smokeless tobacco: Never  Vaping Use   Vaping status: Never Used  Substance and Sexual Activity   Alcohol use: Yes    Comment: rarely   Drug use: No   Sexual activity: Not Currently  Other Topics Concern   Not on file  Social History Narrative   Lives in [place] with husband.  Retired.  Previously worked in [employer] @ [employer].   No T/A/Ds.   Social Drivers of Corporate Investment Banker Strain: Low Risk  (09/13/2023)   Overall Financial Resource Strain (CARDIA)    Difficulty of Paying Living Expenses: Not hard at all  Food Insecurity: No Food Insecurity (09/13/2023)   Hunger Vital Sign    Worried About Running Out of Food in the Last Year: Never true    Ran Out of Food in the Last Year: Never true  Transportation Needs: No Transportation Needs (09/13/2023)   PRAPARE - Administrator, Civil Service (Medical): No    Lack of Transportation (Non-Medical): No  Physical Activity: Inactive (09/13/2023)   Exercise Vital Sign    Days of Exercise per Week: 0 days    Minutes of Exercise per Session: 0 min  Stress:  No Stress Concern Present (09/13/2023)   [employer] of Occupational Health - Occupational Stress Questionnaire    Feeling of Stress : Not at all  Social Connections: Moderately Integrated (09/13/2023)   Social Connection and Isolation Panel [NHANES]    Frequency of Communication with Friends and Family: More than three times a week    Frequency of Social Gatherings with Friends and Family: More than three times a week    Attends Religious Services: 1 to 4 times per year    Active Member of [employer] or Organizations: No    Attends Banker Meetings: Never    Marital Status: Married  Catering Manager Violence: Not At Risk (09/13/2023)   Humiliation, Afraid, Rape, and Kick questionnaire    Fear of Current or Ex-Partner: No    Emotionally Abused: No    Physically Abused: No    Sexually Abused: No    Outpatient Medications Prior to Visit  Medication Sig Dispense Refill   acetaminophen  (TYLENOL ) 500 MG tablet Take 1,000 mg by mouth every 8 (eight) hours as needed for moderate pain.     apixaban  (ELIQUIS ) 5 MG TABS tablet Take 1 tablet (5 mg total) by mouth 2 (two) times daily. 180 tablet 1   AYR SALINE NASAL DROPS NA Place 2 sprays into both nostrils daily.     benzonatate  (TESSALON ) 100 MG capsule Take 1 capsule (100 mg total) by mouth 3 (three) times daily as needed for cough. Do not take with alcohol or while operating or driving heavy machinery 21 capsule 0   Camphor-Eucalyptus-Menthol (VICKS VAPORUB EX) Apply 1 application topically daily.     Cyanocobalamin  (B-12 PO) Take 1 tablet by mouth daily.     doxazosin  (CARDURA ) 2 MG tablet TAKE 1 TABLET(2 MG) BY MOUTH TWICE DAILY (Patient taking differently: Take 2 mg by mouth 2 (two) times daily.) 180 tablet 3   ferrous sulfate 325 (65  FE) MG EC tablet Take 325 mg by mouth daily with breakfast.     hydrocortisone  (ANUSOL -HC) 2.5 % rectal cream Place 1 Application rectally 2 (two) times daily. 30 g 1   levothyroxine  (SYNTHROID ) 125  MCG tablet TAKE 1 AND 1/2 TABLETS ON MONDAYS AND WEDNESDAYS AND FRIDAYS. 1 TABLET ALL OTHER DAYS 40 tablet 1   losartan  (COZAAR ) 100 MG tablet TAKE 1 TABLET BY MOUTH DAILY 90 tablet 1   magnesium  chloride (SLOW-MAG) 64 MG TBEC SR tablet Take 2 tablets (128 mg total) by mouth daily. 180 tablet 1   mycophenolate  (MYFORTIC ) 360 MG TBEC EC tablet Take 360 mg by mouth 2 (two) times daily.     nystatin  powder Apply 1 Application topically once a week.     polyethylene glycol (MIRALAX  / GLYCOLAX ) 17 g packet Take 17 g by mouth daily. 14 each 0   silver  sulfADIAZINE  (SILVADENE ) 1 % cream Apply 1 Application topically daily.     sodium bicarbonate  650 MG tablet Take 1 tablet (650 mg total) by mouth 2 (two) times daily. Takes 1 in AM and 1 at bedtime 180 tablet 3   tacrolimus  (PROGRAF ) 1 MG capsule Take 2 mg by mouth 2 (two) times daily.     triamcinolone  cream (KENALOG ) 0.1 % Apply 1 application topically daily.     verapamil  (CALAN -SR) 120 MG CR tablet TAKE 1 TABLET BY MOUTH EVERY  AFTERNOON (Patient taking differently: Take 120 mg by mouth daily.) 90 tablet 0   verapamil  (CALAN -SR) 240 MG CR tablet TAKE 1 TABLET BY MOUTH EVERY  MORNING 90 tablet 1   Vitamin D , Ergocalciferol , (DRISDOL ) 1.25 MG (50000 UNIT) CAPS capsule TAKE 1 CAPSULE BY MOUTH 2 TIMES A WEEK 24 capsule 0   LORazepam  (ATIVAN ) 0.5 MG tablet Take 1 tablet (0.5 mg total) by mouth 2 (two) times daily as needed for anxiety. 30 tablet 1   No facility-administered medications prior to visit.    Allergies  Allergen Reactions   Labetalol     Patient couldn't walk, low BP   Ceclor [Cefaclor] Rash   Naproxen Rash   Enalapril Maleate Cough    Vasotec   Metoprolol Tartrate Rash    On legs    Review of Systems  Constitutional:  Negative for appetite change, chills, fatigue and fever.  HENT:  Negative for congestion, dental problem, ear pain and sore throat.   Eyes:  Negative for discharge, redness and visual disturbance.  Respiratory:   Negative for cough, chest tightness, shortness of breath and wheezing.   Cardiovascular:  Negative for chest pain, palpitations and leg swelling.  Gastrointestinal:  Negative for abdominal pain, blood in stool, diarrhea, nausea and vomiting.  Genitourinary:  Negative for difficulty urinating, dysuria, flank pain, frequency, hematuria and urgency.  Musculoskeletal:  Negative for arthralgias, back pain, joint swelling, myalgias and neck stiffness.  Skin:  Negative for pallor and rash.  Neurological:  Negative for dizziness, speech difficulty, weakness and headaches.  Hematological:  Negative for adenopathy. Does not bruise/bleed easily.  Psychiatric/Behavioral:  Negative for confusion and sleep disturbance. The patient is not nervous/anxious.     PE;    09/15/2023    9:10 AM 09/15/2023    8:29 AM 09/09/2023    8:13 AM  Vitals with BMI  Height  5' 5.75 5' 5  Weight  239 lbs 10 oz 243 lbs  BMI  38.97 40.44  Systolic 130 159   Diastolic 76 65   Pulse  84   Exam chaperoned  by Bobbetta Degree, CMA.   Gen: Alert, well appearing.  Patient is oriented to person, place, time, and situation. AFFECT: pleasant, lucid thought and speech. ENT: Ears: EACs clear, normal epithelium.  TMs with good light reflex and landmarks bilaterally.  Eyes: no injection, icteris, swelling, or exudate.  EOMI, PERRLA. Nose: no drainage or turbinate edema/swelling.  No injection or focal lesion.  Mouth: lips without lesion/swelling.  Oral mucosa pink and moist.  Dentition intact and without obvious caries or gingival swelling.  Oropharynx without erythema, exudate, or swelling.  Neck: supple/nontender.  No LAD, mass, or TM.  Carotid pulses 2+ bilaterally, without bruits. CV: some periods of RRR but also some periods of irreg irreg with normal rate.   no m/r/g.   LUNGS: CTA bilat, nonlabored resps, good aeration in all lung fields. ABD: soft, NT, ND, BS normal.  No hepatospenomegaly or mass.  No bruits. EXT: no clubbing  or cyanosis.  2+  bilat LE pitting edema.   Musculoskeletal: no joint swelling, erythema, warmth, or tenderness.  ROM of all joints intact. Skin - no sores or suspicious lesions or rashes or color changes  Pertinent labs:  Lab Results  Component Value Date   TSH 3.310 09/12/2023   Lab Results  Component Value Date   WBC 6.7 06/13/2023   HGB 11.4 09/12/2023   HCT 37.3 09/12/2023   MCV 89.1 06/13/2023   PLT 331.0 06/13/2023   Lab Results  Component Value Date   CREATININE 0.96 06/13/2023   BUN 18 06/13/2023   NA 141 06/13/2023   K 4.0 06/13/2023   CL 106 06/13/2023   CO2 26 06/13/2023   Lab Results  Component Value Date   ALT 14 06/02/2023   AST 18 06/02/2023   ALKPHOS 66 06/02/2023   BILITOT 0.8 06/02/2023   Lab Results  Component Value Date   CHOL 148 09/13/2022   Lab Results  Component Value Date   HDL 41.30 09/13/2022   Lab Results  Component Value Date   LDLCALC 79 09/13/2022   Lab Results  Component Value Date   TRIG 140.0 09/13/2022   Lab Results  Component Value Date   CHOLHDL 4 09/13/2022   Lab Results  Component Value Date   HGBA1C 5.4 02/23/2012  Last vitamin D  Lab Results  Component Value Date   VD25OH 22.47 (L) 05/11/2023   Lab Results  Component Value Date   VITAMINB12 417 11/17/2022   ASSESSMENT AND PLAN:   No problem-specific Assessment & Plan notes found for this encounter.  1 health maintenance exam: Reviewed age and gender appropriate health maintenance issues (prudent diet, regular exercise, health risks of tobacco and excessive alcohol, use of seatbelts, fire alarms in home, use of sunscreen).  Also reviewed age and gender appropriate health screening as well as vaccine recommendations. Vaccines: Shingrix --rx at pharmacy.  Otherwise UTD.  Labs: cbc,cmet, flp,vit D. Cervical ca screening: No further screening indicated Breast ca screening: Next screening mammogram due November this year. Colon ca screening: Last colonoscopy  was 2013. 2024 colonoscopy w/out polyp.  No further screening indicated. Osteoporosis: 2022 T score -3.7--->rx'd alendronate  and has not started it yet, still HESITANT.    2) anemia due to lower GI bleed 05/2023.  Suspected to be hemorrhoidal bleeding. Resolved. She has been on iron sulfate 325 mg once daily.   #3 PAF, chronic anticoagulation with Eliquis . She and cardiology are considering doing Watchman procedure.   #4 chronic renal insufficiency stage III. History of renal transplant. GFR stable at  50 on 09/09/2023 ED visit. Continue current dosing of mycophenolate  and tacrolimus .   #5 hypothyroidism.  Most recent TSH about 3 month ago was slightly elevated. Adjusted levothyroxine  dose just a little bit: Take a whole 125 mcg tab 6 days a week and a half tab 1 day a week. TSH recheck was 3.3 on 09/12/2023.  #6 chronic anxiety.  She uses lorazepam  pretty infrequently. #30, refill x 1 given today. Urine drug screen today.  #7 hypertension, well-controlled on losartan  100 mg a day. Recent electrolytes normal and serum creatinine stable.  An After Visit Summary was printed and given to the patient.  FOLLOW UP:  Return in about 3 months (around 12/13/2023) for routine chronic illness f/u.  Signed:  Gerlene Hockey, MD           09/15/2023

## 2023-09-17 LAB — DRUG MONITORING PANEL 376104, URINE
Amphetamines: NEGATIVE ng/mL (ref <500)
Barbiturates: NEGATIVE ng/mL (ref <300)
Benzodiazepines: NEGATIVE ng/mL (ref <100)
Cocaine Metabolite: NEGATIVE ng/mL (ref <150)
Desmethyltramadol: NEGATIVE ng/mL (ref <100)
Opiates: NEGATIVE ng/mL (ref <100)
Oxycodone: NEGATIVE ng/mL (ref <100)
Tramadol: NEGATIVE ng/mL (ref <100)

## 2023-09-17 LAB — DM TEMPLATE

## 2023-09-18 ENCOUNTER — Encounter: Payer: Self-pay | Admitting: Family Medicine

## 2023-09-19 DIAGNOSIS — Z940 Kidney transplant status: Secondary | ICD-10-CM | POA: Diagnosis not present

## 2023-09-26 DIAGNOSIS — N390 Urinary tract infection, site not specified: Secondary | ICD-10-CM | POA: Diagnosis not present

## 2023-09-27 ENCOUNTER — Ambulatory Visit: Payer: Medicare Other | Admitting: Cardiology

## 2023-09-29 ENCOUNTER — Other Ambulatory Visit: Payer: Self-pay | Admitting: Family Medicine

## 2023-10-03 DIAGNOSIS — K469 Unspecified abdominal hernia without obstruction or gangrene: Secondary | ICD-10-CM | POA: Diagnosis not present

## 2023-10-03 DIAGNOSIS — K760 Fatty (change of) liver, not elsewhere classified: Secondary | ICD-10-CM | POA: Diagnosis not present

## 2023-10-03 DIAGNOSIS — I4891 Unspecified atrial fibrillation: Secondary | ICD-10-CM | POA: Diagnosis not present

## 2023-10-03 DIAGNOSIS — B259 Cytomegaloviral disease, unspecified: Secondary | ICD-10-CM | POA: Diagnosis not present

## 2023-10-03 DIAGNOSIS — N2581 Secondary hyperparathyroidism of renal origin: Secondary | ICD-10-CM | POA: Diagnosis not present

## 2023-10-03 DIAGNOSIS — I10 Essential (primary) hypertension: Secondary | ICD-10-CM | POA: Diagnosis not present

## 2023-10-03 DIAGNOSIS — Z940 Kidney transplant status: Secondary | ICD-10-CM | POA: Diagnosis not present

## 2023-10-03 DIAGNOSIS — N051 Unspecified nephritic syndrome with focal and segmental glomerular lesions: Secondary | ICD-10-CM | POA: Diagnosis not present

## 2023-10-03 DIAGNOSIS — D849 Immunodeficiency, unspecified: Secondary | ICD-10-CM | POA: Diagnosis not present

## 2023-10-03 DIAGNOSIS — K769 Liver disease, unspecified: Secondary | ICD-10-CM | POA: Diagnosis not present

## 2023-10-03 DIAGNOSIS — D62 Acute posthemorrhagic anemia: Secondary | ICD-10-CM | POA: Diagnosis not present

## 2023-10-17 ENCOUNTER — Encounter: Payer: Self-pay | Admitting: Family Medicine

## 2023-10-17 ENCOUNTER — Ambulatory Visit: Admitting: Family Medicine

## 2023-10-17 VITALS — BP 134/82 | HR 74 | Temp 98.0°F | Wt 239.0 lb

## 2023-10-17 DIAGNOSIS — R8290 Unspecified abnormal findings in urine: Secondary | ICD-10-CM | POA: Diagnosis not present

## 2023-10-17 DIAGNOSIS — L2082 Flexural eczema: Secondary | ICD-10-CM | POA: Diagnosis not present

## 2023-10-17 LAB — POC URINALSYSI DIPSTICK (AUTOMATED)
Bilirubin, UA: NEGATIVE
Blood, UA: NEGATIVE
Glucose, UA: NEGATIVE
Ketones, UA: NEGATIVE
Leukocytes, UA: NEGATIVE
Nitrite, UA: NEGATIVE
Protein, UA: POSITIVE — AB
Spec Grav, UA: >=1.03 — AB (ref 1.010–1.025)
Urobilinogen, UA: NEGATIVE U/dL — AB
pH, UA: 6 (ref 5.0–8.0)

## 2023-10-17 MED ORDER — TRIAMCINOLONE ACETONIDE 0.1 % EX CREA: 1.0000 | TOPICAL_CREAM | Freq: Two times a day (BID) | CUTANEOUS | 0 refills | Status: AC

## 2023-10-17 NOTE — Progress Notes (Signed)
 Tabitha Green , 12-16-1945, 78 y.o., female MRN: [id number] Patient Care Team    Relationship Specialty Notifications Start End  McGowen, Maryjean Morn, MD PCP - General Family Medicine  08/03/22   Wendall Stade, MD PCP - Cardiology Cardiology  02/16/23   Lanier Prude, MD PCP - Electrophysiology Cardiology  02/17/23   Hilarie Fredrickson, MD Consulting Physician Gastroenterology  05/19/15   Lenda Kelp, MD Consulting Physician Sports Medicine  05/04/16   Sherren Kerns, MD (Inactive) Consulting Physician Vascular Surgery  07/17/16   Chuck Hint, MD (Inactive) Consulting Physician Vascular Surgery  03/31/17   Malvin Johns Physician Assistant Orthopedic Surgery  08/28/17   Pichardo-Geisinger, Robby Sermon, MD Consulting Physician Dermatology  09/26/17   Tomi Bamberger, PA-C Consulting Physician Orthopedic Surgery  01/11/18    Comment: PA with Warnell Forester, MD Consulting Physician Nephrology  09/18/18   Monica Becton, MD Consulting Physician Family Medicine  02/18/19    Comment: Sports Medicine  Cain Saupe, MD Referring Physician Ophthalmology  10/29/20   Ebony Cargo, MD Consulting Physician Transplant  12/21/20   Wendall Stade, MD Consulting Physician Cardiology  01/23/23     Chief Complaint  Patient presents with   Rash    Couple of days; Both arms, elbow area. Irritation. Pt has applied vaso line to the area.      Subjective: Tabitha Green is a 78 y.o. Pt presents for an OV with complaints of itchy rash of bilateral antecubital fossa of 2-3 days duration.  Associated symptoms include red and flaky.  She had put Vaseline over it yesterday and the rash appears a little better today.  She does have a history of eczema.  Patient also complains of an abnormal urine odor if 2-3 days.  She states she has a history of kidney transplant.  Her last GFR 56.  She reports she had a UTI  [facility] about 3-4 weeks ago,  treated with Keflex 250 mg twice daily.  She did feel like the infection had resolved. Prior UTIs have been E. coli and protease.  She is allergic to Ceclor. She denies fever, chills, nausea or dysuria.     09/15/2023    8:30 AM 09/13/2023   10:19 AM 06/13/2023    9:37 AM 05/11/2023    8:24 AM 02/28/2023    9:59 AM  Depression screen PHQ 2/9  Decreased Interest 2 0 1 1 1   Down, Depressed, Hopeless 2 2 2 1 2   PHQ - 2 Score 4 2 3 2 3   Altered sleeping 1 0 1 2 1   Tired, decreased energy 2 1 2 2 2   Change in appetite 1 0 0 1 1  Feeling bad or failure about yourself  1 0 1 1 1   Trouble concentrating 0 0 0 0 0  Moving slowly or fidgety/restless 0 0 0 0 0  Suicidal thoughts 0 0 0 0 0  PHQ-9 Score 9 3 7 8 8   Difficult doing work/chores Somewhat difficult Not difficult at all Somewhat difficult Somewhat difficult Somewhat difficult    Allergies  Allergen Reactions   Labetalol     Patient couldn't walk, low BP   Ceclor [Cefaclor] Rash   Naproxen Rash   Enalapril Maleate Cough    Vasotec   Metoprolol Tartrate Rash    On legs   Social History   Social History Narrative   Lives in [place] with husband.  Retired.  Previously worked in [employer] @ [employer].   No T/A/Ds.   Past Medical History:  Diagnosis Date   Anemia    when I was on dyalysis   Anxiety    Arthritis    Cataract    Cholelithiasis 04/2021   noted on CT abd/pelv done for R lower abd swelling and MR abd   Chronic renal insufficiency, stage III (moderate) (HCC) 10/2015   Post-transplant baseline sCr 1.2-1.4.     CMV infection [facility]) summer 2017   Valcyte per ID/Renal transplant team   Depression    Diverticulosis    a. 05/2012 colonoscopy   Erosive lichen planus of vulva    Topical steroids (managed by Dr. Robby Sermon Pichardo-Geisinger via [facility] outpt services.   ESRD (end stage renal disease) (HCC)    began dialysis 2014--followed by Dr. Lowell Guitar.  Received deceased donor kidney transplant 10/2015.    Fatty liver 10/2021   with hepatomegaly   FSGS (focal segmental glomerulosclerosis)    right kidney; hx of left renal cell cancer and got nephrectomy 1993.   GERD (gastroesophageal reflux disease)    Gout    Heart murmur    (Diastolic) ECHO 08/2013 showed that this murmur is coming from pt's R arm AV fistula   Hiatal hernia    History of renal cell cancer 1993   Left nephrectomy   Hyperlipidemia    Hypertension    Hypothyroidism    Impingement syndrome of left shoulder 04/2016   Dr. Bertram Savin to PT   Inguinal hernia    right   Kidney cancer, primary, with metastasis from kidney to other site [facility])    Lumbar spinal stenosis    Chronic LBP with bilat neurogenic claudication   Metatarsal fracture, pathologic 01/2018   Right; orthocarolina-->post op shoe continued, f/u x-ray planned.   Obesity    Osteoarthritis of left knee 08/2017   severe, diffuse, tricompartmental.  Responded to steroid injection.     Osteoporosis 06/07/2016   DEXA T-score of -3.1.  Fosamax planned 2017 but dental work prevented start..  Pathological toe fx 12/2017--repeat DEXA 08/2018, T-score -3.3 radius. 12/2020 T score radius -3.7.   PAF (paroxysmal atrial fibrillation) (HCC)    dx 11/2022   Pneumonia    Renal transplant recipient 2015/11/27   Baseline Cr as of 09/2020 1.2-1.4   Simple hepatic cyst    2022   Solitary kidney, acquired    SVT (supraventricular tachycardia) (HCC)    UTI (urinary tract infection)    Ventral hernia    Vitamin B12 deficiency 12/2018   Starting replacement as of 12/11/2018   Past Surgical History:  Procedure Laterality Date   AV FISTULA PLACEMENT Right    Right arm: aneurismal dilatation 2017 being followed by CV surgeons   CARDIOVASCULAR STRESS TEST     03/10/15 ETT ([facility]): Exercise ECG negative at 83% max predicted HR.    CATARACT EXTRACTION     COLONOSCOPY  2003; 05/2012   2013 diverticulosis, no polyps.  03/10/23 ->one adenoma and extensive diverticulosis as well  as internal hemorrhoids   colonoscopy with polypectomy     Dr Marina Goodell   DEXA  06/07/2016; 08/22/2018; 12/30/20   2017 T-score -3.1.  2020 T score -3.3.  2022 T score -3.7   HYSTERECTOMY ABDOMINAL WITH SALPINGECTOMY     KIDNEY TRANSPLANT  11-27-15   Deceased donor kidney transplant, with Thymoglobulin induction   LIGATION OF ARTERIOVENOUS  FISTULA Right 02/14/2017   Procedure: LIGATION OF  ARTERIOVENOUS  FISTULA;  Surgeon: Chuck Hint, MD;  Location: [facility] OR;  Service: Vascular;  Laterality: Right;   NEPHRECTOMY  1993   for malignancy- left   RENAL BIOPSY     right   RESECTION OF ARTERIOVENOUS FISTULA ANEURYSM Right 02/14/2017   Procedure: EXCISION OF RIGHT BRACHIOCEPHALIC ARTERIOVENOUS FISTULA ANEURYSM;  Surgeon: Chuck Hint, MD;  Location: [facility] OR;  Service: Vascular;  Laterality: Right;   TEE WITHOUT CARDIOVERSION N/A 08/27/2013   Procedure: TRANSESOPHAGEAL ECHOCARDIOGRAM (TEE);  Surgeon: Wendall Stade, MD;  Location: [facility] ENDOSCOPY;  Service: Cardiovascular;  Laterality: N/A;   TOTAL ABDOMINAL HYSTERECTOMY W/ BILATERAL SALPINGOOPHORECTOMY  1997   fibroids   TRANSTHORACIC ECHOCARDIOGRAM     03/10/15 echo ([facility]): LV cavity normal in size, focal basal hypertrophy, normal systolic function, EF 55% (visual est). Normal wall motion, no regional wall motion abnormalities. Mild diastolic dysfunction with normal LA chamber size. No significant valve stenosis or regurgitation.  12/2022 normal   VULVA / PERINEUM BIOPSY  2015   Family History  Problem Relation Age of Onset   Deep vein thrombosis Mother        post thyroid surgery   Hypertension Mother    Heart attack Father 21       deceased   Hypertension Father    Cancer Sister        breast   Cancer Paternal Aunt        pancreatic   Diabetes Maternal Grandfather    Stroke Paternal Grandmother        in 79s   Fibroids Daughter    Colon cancer Neg Hx    Esophageal cancer Neg Hx    Stomach cancer  Neg Hx    Rectal cancer Neg Hx    Sleep apnea Neg Hx    Allergies as of 10/17/2023       Reactions   Labetalol    Patient couldn't walk, low BP   Ceclor [cefaclor] Rash   Naproxen Rash   Enalapril Maleate Cough   Vasotec   Metoprolol Tartrate Rash   On legs        Medication List        Accurate as of October 17, 2023 10:26 AM. If you have any questions, ask your nurse or doctor.          acetaminophen 500 MG tablet Commonly known as: TYLENOL Take 1,000 mg by mouth every 8 (eight) hours as needed for moderate pain.   apixaban 5 MG Tabs tablet Commonly known as: ELIQUIS Take 1 tablet (5 mg total) by mouth 2 (two) times daily.   AYR SALINE NASAL DROPS NA Place 2 sprays into both nostrils daily.   B-12 PO Take 1 tablet by mouth daily.   benzonatate 100 MG capsule Commonly known as: TESSALON Take 1 capsule (100 mg total) by mouth 3 (three) times daily as needed for cough. Do not take with alcohol or while operating or driving heavy machinery   doxazosin 2 MG tablet Commonly known as: CARDURA TAKE 1 TABLET(2 MG) BY MOUTH TWICE DAILY What changed: See the new instructions.   ferrous sulfate 325 (65 FE) MG EC tablet Take 325 mg by mouth daily with breakfast.   hydrocortisone 2.5 % rectal cream Commonly known as: ANUSOL-HC Place 1 Application rectally 2 (two) times daily.   levothyroxine 125 MCG tablet Commonly known as: SYNTHROID TAKE 1 AND 1/2 TABLETS ON MONDAYS AND WEDNESDAYS AND FRIDAYS. 1 TABLET ALL OTHER DAYS  LORazepam 0.5 MG tablet Commonly known as: ATIVAN Take 1 tablet (0.5 mg total) by mouth 2 (two) times daily as needed for anxiety.   losartan 100 MG tablet Commonly known as: COZAAR TAKE 1 TABLET BY MOUTH DAILY   magnesium chloride 64 MG Tbec SR tablet Commonly known as: SLOW-MAG Take 2 tablets (128 mg total) by mouth daily.   mycophenolate 360 MG Tbec EC tablet Commonly known as: MYFORTIC Take 360 mg by mouth 2 (two) times daily.    nystatin powder Apply 1 Application topically once a week.   polyethylene glycol 17 g packet Commonly known as: MIRALAX / GLYCOLAX Take 17 g by mouth daily.   silver sulfADIAZINE 1 % cream Commonly known as: SILVADENE Apply 1 Application topically daily.   sodium bicarbonate 650 MG tablet Take 1 tablet (650 mg total) by mouth 2 (two) times daily. Takes 1 in AM and 1 at bedtime   tacrolimus 1 MG capsule Commonly known as: PROGRAF Take 2 mg by mouth 2 (two) times daily.   triamcinolone cream 0.1 % Commonly known as: KENALOG Apply 1 Application topically 2 (two) times daily. What changed: when to take this Changed by: Felix Pacini   verapamil 120 MG CR tablet Commonly known as: CALAN-SR TAKE 1 TABLET BY MOUTH EVERY  AFTERNOON What changed: See the new instructions.   verapamil 240 MG CR tablet Commonly known as: CALAN-SR TAKE 1 TABLET BY MOUTH IN THE  MORNING What changed: Another medication with the same name was changed. Make sure you understand how and when to take each.   VICKS VAPORUB EX Apply 1 application topically daily.   Vitamin D (Ergocalciferol) 1.25 MG (50000 UNIT) Caps capsule Commonly known as: DRISDOL TAKE 1 CAPSULE BY MOUTH 2 TIMES A WEEK        All past medical history, surgical history, allergies, family history, immunizations andmedications were updated in the EMR today and reviewed under the history and medication portions of their EMR.     ROS Negative, with the exception of above mentioned in HPI   Objective:  BP 134/82   Pulse 74   Temp 98 F (36.7 C)   Wt 239 lb (108.4 kg)   SpO2 98%   BMI 38.87 kg/m  Body mass index is 38.87 kg/m. Physical Exam Vitals and nursing note reviewed.  Constitutional:      General: She is not in acute distress.    Appearance: Normal appearance. She is normal weight. She is not ill-appearing or toxic-appearing.  HENT:     Head: Normocephalic and atraumatic.  Eyes:     General: No scleral icterus.        Right eye: No discharge.        Left eye: No discharge.     Extraocular Movements: Extraocular movements intact.     Conjunctiva/sclera: Conjunctivae normal.     Pupils: Pupils are equal, round, and reactive to light.  Abdominal:     Palpations: Abdomen is soft.     Tenderness: There is no abdominal tenderness. There is no right CVA tenderness or left CVA tenderness.  Skin:    Findings: Rash (Macular red splotches with scaly/dry area bilateral antecubital fossa.) present.  Neurological:     Mental Status: She is alert and oriented to person, place, and time. Mental status is at baseline.     Motor: No weakness.     Coordination: Coordination normal.     Gait: Gait normal.  Psychiatric:  Mood and Affect: Mood normal.        Behavior: Behavior normal.        Thought Content: Thought content normal.        Judgment: Judgment normal.      No results found. No results found. Results for orders placed or performed in visit on 10/17/23 (from the past 24 hours)  POCT Urinalysis Dipstick (Automated)     Status: Abnormal   Collection Time: 10/17/23 10:16 AM  Result Value Ref Range   Color, UA yellow    Clarity, UA clear    Glucose, UA Negative Negative   Bilirubin, UA neg    Ketones, UA neg    Spec Grav, UA >=1.030 (A) 1.010 - 1.025   Blood, UA neg    pH, UA 6.0 5.0 - 8.0   Protein, UA Positive (A) Negative   Urobilinogen, UA negative (A) 0.2 or 1.0 E.U./dL   Nitrite, UA neg    Leukocytes, UA Negative Negative    Assessment/Plan: Tabitha Green is a 78 y.o. female present for OV for  Abnormal urine odor/kidney transplant - POCT Urinalysis Dipstick (Automated) - Urinalysis w microscopic + reflex cultur Patient has a history of kidney transplant, GFR last in system approximately 56. Patient had a recent UTI treated with Keflex 250 twice daily x 7 days by nephrology. Vitals are stable and she is asymptomatic today.  Concerns are reported be stronger odor of her  urine.  We did discuss testing urine and if it appears positive by point-of-care we will call her and start an antibiotic.  If it does not appear infectious we will wait for urine culture and treat appropriately.  Patient is agreeable with plan.  Flexural eczema (Primary) Rash appears consistent with eczema.  Patient does not have a past medical history of eczema. Triamcinolone cream twice daily to area prescribed.  Reviewed expectations re: course of current medical issues. Discussed self-management of symptoms. Outlined signs and symptoms indicating need for more acute intervention. Patient verbalized understanding and all questions were answered. Patient received an After-Visit Summary.    Orders Placed This Encounter  Procedures   Urinalysis w microscopic + reflex cultur   POCT Urinalysis Dipstick (Automated)   Meds ordered this encounter  Medications   triamcinolone cream (KENALOG) 0.1 %    Sig: Apply 1 Application topically 2 (two) times daily.    Dispense:  45 g    Refill:  0   Referral Orders  No referral(s) requested today     Note is dictated utilizing voice recognition software. Although note has been proof read prior to signing, occasional typographical errors still can be missed. If any questions arise, please do not hesitate to call for verification.   electronically signed by:  Felix Pacini, DO  [facility] - [place]

## 2023-10-17 NOTE — Patient Instructions (Addendum)

## 2023-10-18 ENCOUNTER — Encounter: Payer: Self-pay | Admitting: Family Medicine

## 2023-10-18 LAB — URINALYSIS W MICROSCOPIC + REFLEX CULTURE
Bilirubin Urine: NEGATIVE
Glucose, UA: NEGATIVE
Hgb urine dipstick: NEGATIVE
Hyaline Cast: NONE SEEN /LPF
Ketones, ur: NEGATIVE
Leukocyte Esterase: NEGATIVE
Nitrites, Initial: NEGATIVE
Protein, ur: NEGATIVE
RBC / HPF: NONE SEEN /HPF (ref 0–2)
Specific Gravity, Urine: 1.018 (ref 1.001–1.035)
pH: 5.5 (ref 5.0–8.0)

## 2023-10-18 LAB — NO CULTURE INDICATED

## 2023-11-16 ENCOUNTER — Ambulatory Visit: Payer: Medicare Other | Admitting: Cardiology

## 2023-12-03 ENCOUNTER — Other Ambulatory Visit: Payer: Self-pay | Admitting: Family Medicine

## 2023-12-06 ENCOUNTER — Other Ambulatory Visit: Payer: Self-pay

## 2023-12-06 ENCOUNTER — Encounter: Payer: Self-pay | Admitting: Family Medicine

## 2023-12-06 MED ORDER — LEVOTHYROXINE SODIUM 125 MCG PO TABS: ORAL_TABLET | ORAL | 1 refills | Status: DC

## 2023-12-13 ENCOUNTER — Ambulatory Visit (INDEPENDENT_AMBULATORY_CARE_PROVIDER_SITE_OTHER): Payer: Medicare Other | Admitting: Family Medicine

## 2023-12-13 ENCOUNTER — Encounter: Payer: Self-pay | Admitting: Family Medicine

## 2023-12-13 VITALS — BP 140/76 | HR 74 | Temp 98.2°F | Ht 65.75 in | Wt 230.8 lb

## 2023-12-13 DIAGNOSIS — D649 Anemia, unspecified: Secondary | ICD-10-CM | POA: Diagnosis not present

## 2023-12-13 DIAGNOSIS — N1831 Chronic kidney disease, stage 3a: Secondary | ICD-10-CM | POA: Diagnosis not present

## 2023-12-13 DIAGNOSIS — F411 Generalized anxiety disorder: Secondary | ICD-10-CM

## 2023-12-13 DIAGNOSIS — I10 Essential (primary) hypertension: Secondary | ICD-10-CM | POA: Diagnosis not present

## 2023-12-13 LAB — BASIC METABOLIC PANEL WITH GFR
BUN: 15 mg/dL (ref 6–23)
CO2: 26 meq/L (ref 19–32)
Calcium: 9.3 mg/dL (ref 8.4–10.5)
Chloride: 106 meq/L (ref 96–112)
Creatinine, Ser: 0.91 mg/dL (ref 0.40–1.20)
GFR: 60.52 mL/min (ref >60.00)
Glucose, Bld: 102 mg/dL — ABNORMAL HIGH (ref 70–99)
Potassium: 4.5 meq/L (ref 3.5–5.1)
Sodium: 140 meq/L (ref 135–145)

## 2023-12-13 LAB — CBC
HCT: 40.3 % (ref 36.0–46.0)
Hemoglobin: 12.8 g/dL (ref 12.0–15.0)
MCHC: 31.9 g/dL (ref 30.0–36.0)
MCV: 85.7 fl (ref 78.0–100.0)
Platelets: 267 10*3/uL (ref 150.0–400.0)
RBC: 4.7 Mil/uL (ref 3.87–5.11)
RDW: 18 % — ABNORMAL HIGH (ref 11.5–15.5)
WBC: 6.5 10*3/uL (ref 4.0–10.5)

## 2023-12-13 MED ORDER — VERAPAMIL HCL ER 120 MG PO TBCR: EXTENDED_RELEASE_TABLET | ORAL | 1 refills | Status: DC

## 2023-12-13 MED ORDER — LORAZEPAM 0.5 MG PO TABS: 0.5000 mg | ORAL_TABLET | Freq: Two times a day (BID) | ORAL | 1 refills | Status: DC | PRN

## 2023-12-13 MED ORDER — LOSARTAN POTASSIUM 100 MG PO TABS: 100.0000 mg | ORAL_TABLET | Freq: Every day | ORAL | 1 refills | Status: DC

## 2023-12-13 MED ORDER — MAGNESIUM CHLORIDE 64 MG PO TBEC: 2.0000 | DELAYED_RELEASE_TABLET | Freq: Every day | ORAL | 1 refills | Status: AC

## 2023-12-13 MED ORDER — VITAMIN D (ERGOCALCIFEROL) 1.25 MG (50000 UNIT) PO CAPS: ORAL_CAPSULE | ORAL | 1 refills | Status: DC

## 2023-12-13 MED ORDER — VERAPAMIL HCL ER 240 MG PO TBCR: 240.0000 mg | EXTENDED_RELEASE_TABLET | Freq: Every morning | ORAL | 1 refills | Status: DC

## 2023-12-13 MED ORDER — SODIUM BICARBONATE 650 MG PO TABS: 650.0000 mg | ORAL_TABLET | Freq: Two times a day (BID) | ORAL | 1 refills | Status: AC

## 2023-12-13 NOTE — Progress Notes (Signed)
 OFFICE VISIT  12/13/2023  CC: No chief complaint on file.   Patient is a 78 y.o. female who presents for 62-month follow-up chronic anxiety, hypertension, and chronic renal insufficiency stage III. A/P as of last visit: "1) anemia due to lower GI bleed 05/2023.  Suspected to be hemorrhoidal bleeding. Resolved. She has been on iron sulfate 325 mg once daily.   #2 PAF, chronic anticoagulation with Eliquis . She and cardiology are considering doing Watchman procedure.   #3 chronic renal insufficiency stage III. History of renal transplant. GFR stable at 50 on 09/09/2023 ED visit. Continue current dosing of mycophenolate  and tacrolimus .   #4 hypothyroidism.  Most recent TSH about 3 month ago was slightly elevated. Adjusted levothyroxine  dose just a little bit: Take a whole 125 mcg tab 6 days a week and a half tab 1 day a week. TSH recheck was 3.3 on 09/12/2023.   #5 chronic anxiety.  She uses lorazepam  pretty infrequently. #30, refill x 1 given today. Urine drug screen today.   #6 hypertension, well-controlled on losartan  100 mg a day. Recent electrolytes normal and serum creatinine stable."  INTERIM HX: Overwhelmed with anxiety over husband's poor health. Gets some periods of nausea w/out vomiting. Recently missed a couple of doses of levothyrox.  No palp's, DOE, dizziness, CP, or LE swelling.  PMP AWARE reviewed today: most recent rx for lorazepam  was filled 10/20/2023, # 30, rx by me. No red flags.   Past Medical History:  Diagnosis Date   Anemia    when I was on dyalysis   Anxiety    Arthritis    Cataract    Cholelithiasis 04/2021   noted on CT abd/pelv done for R lower abd swelling and MR abd   Chronic renal insufficiency, stage III (moderate) (HCC) 10/2015   Post-transplant baseline sCr 1.2-1.4.     CMV infection [facility]) summer 2017   Valcyte per ID/Renal transplant team   Depression    Diverticulosis    a. 05/2012 colonoscopy   Erosive lichen planus of vulva     Topical steroids (managed by Dr. Leonore Ran [facility].   ESRD (end stage renal disease) (HCC)    began dialysis 2014--followed by Dr. Ada Acres.  Received deceased donor kidney transplant 10/2015.   Fatty liver 10/2021   with hepatomegaly   FSGS (focal segmental glomerulosclerosis)    right kidney; hx of left renal cell cancer and got nephrectomy 1993.   GERD (gastroesophageal reflux disease)    Gout    Heart murmur    (Diastolic) ECHO 08/2013 showed that this murmur is coming from pt's R arm AV fistula   Hiatal hernia    History of renal cell cancer 1993   Left nephrectomy   Hyperlipidemia    Hypertension    Hypothyroidism    Impingement syndrome of left shoulder 04/2016   Dr. Louise Rowels to PT   Inguinal hernia    right   Kidney cancer, primary, with metastasis from kidney to other site [facility])    Lumbar spinal stenosis    Chronic LBP with bilat neurogenic claudication   Metatarsal fracture, pathologic 01/2018   Right; orthocarolina-->post op shoe continued, f/u x-ray planned.   Obesity    Osteoarthritis of left knee 08/2017   severe, diffuse, tricompartmental.  Responded to steroid injection.     Osteoporosis 06/07/2016   DEXA T-score of -3.1.  Fosamax  planned 2017 but dental work prevented start..  Pathological toe fx 12/2017--repeat DEXA 08/2018, T-score -3.3 radius.  12/2020 T score radius -3.7.   PAF (paroxysmal atrial fibrillation) (HCC)    dx 11/2022   Pneumonia    Renal transplant recipient November 18, 2015   Baseline Cr as of 09/2020 1.2-1.4   Simple hepatic cyst    2022   Solitary kidney, acquired    SVT (supraventricular tachycardia) (HCC)    UTI (urinary tract infection)    Ventral hernia    Vitamin B12 deficiency 12/2018   Starting replacement as of 12/11/2018    Past Surgical History:  Procedure Laterality Date   AV FISTULA PLACEMENT Right    Right arm: aneurismal dilatation 2017 being followed by CV surgeons    CARDIOVASCULAR STRESS TEST     03/10/15 ETT ([facility]): Exercise ECG negative at 83% max predicted HR.    CATARACT EXTRACTION     COLONOSCOPY  2003; 05/2012   2013 diverticulosis, no polyps.  03/10/23 ->one adenoma and extensive diverticulosis as well as internal hemorrhoids   colonoscopy with polypectomy     Dr Elvin Hammer   DEXA  06/07/2016; 08/22/2018; 12/30/20   2017 T-score -3.1.  2020 T score -3.3.  2022 T score -3.7   HYSTERECTOMY ABDOMINAL WITH SALPINGECTOMY     KIDNEY TRANSPLANT  11/18/2015   Deceased donor kidney transplant, with Thymoglobulin induction   LIGATION OF ARTERIOVENOUS  FISTULA Right 02/14/2017   Procedure: LIGATION OF ARTERIOVENOUS  FISTULA;  Surgeon: Dannis Dy, MD;  Location: [facility] OR;  Service: Vascular;  Laterality: Right;   NEPHRECTOMY  1993   for malignancy- left   RENAL BIOPSY     right   RESECTION OF ARTERIOVENOUS FISTULA ANEURYSM Right 02/14/2017   Procedure: EXCISION OF RIGHT BRACHIOCEPHALIC ARTERIOVENOUS FISTULA ANEURYSM;  Surgeon: Dannis Dy, MD;  Location: [facility] OR;  Service: Vascular;  Laterality: Right;   TEE WITHOUT CARDIOVERSION N/A 08/27/2013   Procedure: TRANSESOPHAGEAL ECHOCARDIOGRAM (TEE);  Surgeon: Loyde Rule, MD;  Location: [facility] END[facility]COPY;  Service: Cardiovascular;  Laterality: N/A;   TOTAL ABDOMINAL HYSTERECTOMY W/ BILATERAL SALPINGOOPHORECTOMY  1997   fibroids   TRANSTHORACIC ECHOCARDIOGRAM     03/10/15 echo ([facility]): LV cavity normal in size, focal basal hypertrophy, normal systolic function, EF 55% (visual est). Normal wall motion, no regional wall motion abnormalities. Mild diastolic dysfunction with normal LA chamber size. No significant valve stenosis or regurgitation.  12/2022 normal   VULVA / PERINEUM BIOPSY  2015    Outpatient Medications Prior to Visit  Medication Sig Dispense Refill   acetaminophen  (TYLENOL ) 500 MG tablet Take 1,000 mg by mouth every 8 (eight) hours as needed for moderate pain.      apixaban  (ELIQUIS ) 5 MG TABS tablet Take 1 tablet (5 mg total) by mouth 2 (two) times daily. 180 tablet 1   AYR SALINE NASAL DROPS NA Place 2 sprays into both nostrils daily.     benzonatate  (TESSALON ) 100 MG capsule Take 1 capsule (100 mg total) by mouth 3 (three) times daily as needed for cough. Do not take with alcohol or while operating or driving heavy machinery 21 capsule 0   Camphor-Eucalyptus-Menthol (VICKS VAPORUB EX) Apply 1 application topically daily.     Cyanocobalamin  (B-12 PO) Take 1 tablet by mouth daily.     doxazosin  (CARDURA ) 2 MG tablet TAKE 1 TABLET(2 MG) BY MOUTH TWICE DAILY (Patient taking differently: Take 2 mg by mouth 2 (two) times daily.) 180 tablet 3   ferrous sulfate 325 (65 FE) MG EC tablet Take 325 mg by mouth daily with breakfast.  hydrocortisone  (ANUSOL -HC) 2.5 % rectal cream Place 1 Application rectally 2 (two) times daily. 30 g 1   levothyroxine  (SYNTHROID ) 125 MCG tablet TAKE 1 AND 1/2 TABLETS ON MONDAYS AND WEDNESDAYS AND FRIDAYS. 1 TABLET ALL OTHER DAYS 40 tablet 1   LORazepam  (ATIVAN ) 0.5 MG tablet Take 1 tablet (0.5 mg total) by mouth 2 (two) times daily as needed for anxiety. 30 tablet 1   losartan  (COZAAR ) 100 MG tablet TAKE 1 TABLET BY MOUTH DAILY 90 tablet 0   magnesium  chloride (SLOW-MAG) 64 MG TBEC SR tablet Take 2 tablets (128 mg total) by mouth daily. 180 tablet 1   mycophenolate  (MYFORTIC ) 360 MG TBEC EC tablet Take 360 mg by mouth 2 (two) times daily.     nystatin  powder Apply 1 Application topically once a week.     polyethylene glycol (MIRALAX  / GLYCOLAX ) 17 g packet Take 17 g by mouth daily. 14 each 0   silver  sulfADIAZINE  (SILVADENE ) 1 % cream Apply 1 Application topically daily.     sodium bicarbonate  650 MG tablet Take 1 tablet (650 mg total) by mouth 2 (two) times daily. Takes 1 in AM and 1 at bedtime 180 tablet 3   tacrolimus  (PROGRAF ) 1 MG capsule Take 2 mg by mouth 2 (two) times daily.     triamcinolone  cream (KENALOG ) 0.1 % Apply 1  Application topically 2 (two) times daily. 45 g 0   verapamil  (CALAN -SR) 120 MG CR tablet TAKE 1 TABLET BY MOUTH EVERY  AFTERNOON (Patient taking differently: Take 120 mg by mouth daily.) 90 tablet 0   verapamil  (CALAN -SR) 240 MG CR tablet TAKE 1 TABLET BY MOUTH IN THE  MORNING 90 tablet 0   Vitamin D , Ergocalciferol , (DRISDOL ) 1.25 MG (50000 UNIT) CAPS capsule TAKE 1 CAPSULE BY MOUTH 2 TIMES A WEEK 24 capsule 0   No facility-administered medications prior to visit.    Allergies  Allergen Reactions   Labetalol     Patient couldn't walk, low BP   Ceclor [Cefaclor] Rash   Naproxen Rash   Enalapril Maleate Cough    Vasotec   Metoprolol Tartrate Rash    On legs    Review of Systems As per HPI  PE:    10/17/2023    9:27 AM 09/15/2023    9:10 AM 09/15/2023    8:29 AM  Vitals with BMI  Height   5' 5.75"  Weight 239 lbs  239 lbs 10 oz  BMI   38.97  Systolic 134 130 161  Diastolic 82 76 65  Pulse 74  84     Physical Exam  Gen: Alert, well appearing.  Patient is oriented to person, place, time, and situation. AFFECT: pleasant, lucid thought and speech. CV: irreg irreg,  EXT: trace bilat LE pitting edema  LABS:  Last CBC Lab Results  Component Value Date   WBC 4.9 09/09/2023   HGB 11.4 09/12/2023   HCT 37.3 09/12/2023   MCV 89.1 06/13/2023   MCH 29.3 06/08/2023   RDW 14.5 06/13/2023   PLT 238 09/09/2023   Lab Results  Component Value Date   IRON 50 12/13/2023   TIBC 365 12/13/2023   FERRITIN 12 (L) 12/13/2023   Lab Results  Component Value Date   IRON 17 (L) 06/13/2023   TIBC 373 06/13/2023   FERRITIN 7 (L) 06/13/2023   Last metabolic panel Lab Results  Component Value Date   GLUCOSE 114 (H) 06/13/2023   NA 139 09/09/2023   K 3.8 09/09/2023  CL 107 09/09/2023   CO2 24 (A) 09/09/2023   BUN 12 09/09/2023   CREATININE 1.1 09/09/2023   EGFR 50 09/09/2023   CALCIUM  9.4 09/09/2023   PHOS 2.9 01/04/2018   PROT 6.2 (L) 06/02/2023   ALBUMIN 3.6 09/09/2023    BILITOT 0.8 06/02/2023   ALKPHOS 86 09/09/2023   AST 18 09/09/2023   ALT 11 09/09/2023   ANIONGAP 9 06/02/2023   Last lipids Lab Results  Component Value Date   CHOL 130 09/15/2023   HDL 42.20 09/15/2023   LDLCALC 62 09/15/2023   LDLDIRECT 166.3 10/02/2007   TRIG 131.0 09/15/2023   CHOLHDL 3 09/15/2023   Last hemoglobin A1c Lab Results  Component Value Date   HGBA1C 5.4 02/23/2012   Last thyroid  functions Lab Results  Component Value Date   TSH 3.310 09/12/2023   Last vitamin D  Lab Results  Component Value Date   VD25OH 37.90 09/15/2023   Last vitamin B12 and Folate Lab Results  Component Value Date   VITAMINB12 417 11/17/2022   IMPRESSION AND PLAN:    #1 chronic renal insufficiency stage III. History of renal transplant. GFR stable at 50 on 09/09/2023 ED visit. Continue current dosing of mycophenolate  and tacrolimus .  BMET today.  #2 chronic anxiety.  She uses lorazepam  pretty infrequently.   #3 hypertension, well-controlled on losartan  100 mg a day. BMET today.  #4 Anemia due to lower GI bleed 05/2023.   Suspected to be hemorrhoidal bleeding. She has been on iron sulfate 325 mg once daily Monitor CBC and iron panel today.  An After Visit Summary was printed and given to the patient.  FOLLOW UP: 3 mo Next CPE 09/2024 Signed:  Arletha Lady, MD           12/13/2023

## 2023-12-14 LAB — IRON,TIBC AND FERRITIN PANEL
%SAT: 14 % — ABNORMAL LOW (ref 16–45)
Ferritin: 12 ng/mL — ABNORMAL LOW (ref 16–288)
Iron: 50 ug/dL (ref 45–160)
TIBC: 365 ug/dL (ref 250–450)

## 2023-12-14 NOTE — Telephone Encounter (Signed)
 MyChart message read.

## 2023-12-26 ENCOUNTER — Encounter: Payer: Self-pay | Admitting: Family Medicine

## 2023-12-26 ENCOUNTER — Other Ambulatory Visit: Payer: Self-pay | Admitting: Family Medicine

## 2023-12-26 MED ORDER — APIXABAN 5 MG PO TABS
5.0000 mg | ORAL_TABLET | Freq: Two times a day (BID) | ORAL | 3 refills | Status: AC
Start: 2023-12-26 — End: ?

## 2023-12-26 NOTE — Telephone Encounter (Signed)
 Copied from CRM [id number]. Topic: Clinical - Medication Refill >> Dec 26, 2023  1:08 PM Earnestine Goes B wrote: Medication: apixaban  (ELIQUIS ) 5 MG TABS tablet  Has the patient contacted their pharmacy? Yes (Agent: If no, request that the patient contact the pharmacy for the refill. If patient does not wish to contact the pharmacy document the reason why and proceed with request.) (Agent: If yes, when and what did the pharmacy advise?)  This is the patient's preferred pharmacy:  [id number] - [place], [place] - [address] AT [facility] [address]  [place] [address] Phone: [phone] Fax: [phone]    Is this the correct pharmacy for this prescription? Yes If no, delete pharmacy and type the correct one.   Has the prescription been filled recently? Yes  Is the patient out of the medication? Yes  Has the patient been seen for an appointment in the last year OR does the patient have an upcoming appointment? Yes  Can we respond through MyChart? Yes  Agent: Please be advised that Rx refills may take up to 3 business days. We ask that you follow-up with your pharmacy.

## 2023-12-26 NOTE — Telephone Encounter (Signed)
 Eliquis rx sent

## 2024-01-05 DIAGNOSIS — N2581 Secondary hyperparathyroidism of renal origin: Secondary | ICD-10-CM | POA: Diagnosis not present

## 2024-01-05 DIAGNOSIS — Z940 Kidney transplant status: Secondary | ICD-10-CM | POA: Diagnosis not present

## 2024-01-05 DIAGNOSIS — K769 Liver disease, unspecified: Secondary | ICD-10-CM | POA: Diagnosis not present

## 2024-01-10 ENCOUNTER — Other Ambulatory Visit: Payer: Self-pay | Admitting: Family Medicine

## 2024-01-18 DIAGNOSIS — I10 Essential (primary) hypertension: Secondary | ICD-10-CM | POA: Diagnosis not present

## 2024-01-18 DIAGNOSIS — N2581 Secondary hyperparathyroidism of renal origin: Secondary | ICD-10-CM | POA: Diagnosis not present

## 2024-01-18 DIAGNOSIS — K760 Fatty (change of) liver, not elsewhere classified: Secondary | ICD-10-CM | POA: Diagnosis not present

## 2024-01-18 DIAGNOSIS — K769 Liver disease, unspecified: Secondary | ICD-10-CM | POA: Diagnosis not present

## 2024-01-18 DIAGNOSIS — N051 Unspecified nephritic syndrome with focal and segmental glomerular lesions: Secondary | ICD-10-CM | POA: Diagnosis not present

## 2024-01-18 DIAGNOSIS — K469 Unspecified abdominal hernia without obstruction or gangrene: Secondary | ICD-10-CM | POA: Diagnosis not present

## 2024-01-18 DIAGNOSIS — D62 Acute posthemorrhagic anemia: Secondary | ICD-10-CM | POA: Diagnosis not present

## 2024-01-18 DIAGNOSIS — B259 Cytomegaloviral disease, unspecified: Secondary | ICD-10-CM | POA: Diagnosis not present

## 2024-01-18 DIAGNOSIS — Z940 Kidney transplant status: Secondary | ICD-10-CM | POA: Diagnosis not present

## 2024-01-18 DIAGNOSIS — I4891 Unspecified atrial fibrillation: Secondary | ICD-10-CM | POA: Diagnosis not present

## 2024-01-18 DIAGNOSIS — D849 Immunodeficiency, unspecified: Secondary | ICD-10-CM | POA: Diagnosis not present

## 2024-02-15 ENCOUNTER — Ambulatory Visit (INDEPENDENT_AMBULATORY_CARE_PROVIDER_SITE_OTHER): Admitting: Family Medicine

## 2024-02-15 VITALS — BP 130/77 | HR 75 | Temp 98.0°F | Ht 65.75 in | Wt 233.0 lb

## 2024-02-15 DIAGNOSIS — D509 Iron deficiency anemia, unspecified: Secondary | ICD-10-CM | POA: Diagnosis not present

## 2024-02-15 DIAGNOSIS — F4320 Adjustment disorder, unspecified: Secondary | ICD-10-CM

## 2024-02-15 LAB — CBC
HCT: 42.1 % (ref 36.0–46.0)
Hemoglobin: 13.7 g/dL (ref 12.0–15.0)
MCHC: 32.5 g/dL (ref 30.0–36.0)
MCV: 89.2 fl (ref 78.0–100.0)
Platelets: 222 K/uL (ref 150.0–400.0)
RBC: 4.72 Mil/uL (ref 3.87–5.11)
RDW: 16.9 % — ABNORMAL HIGH (ref 11.5–15.5)
WBC: 6.3 K/uL (ref 4.0–10.5)

## 2024-02-15 NOTE — Progress Notes (Signed)
 OFFICE VISIT  02/15/2024  CC:  Chief Complaint  Patient presents with   Fatigue    Patient is a 78 y.o. female who presents for fatigue.  HPI: Tabitha Green is grieving for her husband Prentice who died a couple of months ago. Always sad, trying not to cry a lot.  She gets out and does things the best she can. She has good family and friends support.  Feels overwhelmed.  Tired more than her usual, sometimes a little swimmy headed/wobbly. No falls, no vertigo.  No melena or hematochezia.  Past Medical History:  Diagnosis Date   Anemia    when I was on dyalysis   Anxiety    Arthritis    Cataract    Cholelithiasis 04/2021   noted on CT abd/pelv done for R lower abd swelling and MR abd   Chronic renal insufficiency, stage III (moderate) (HCC) 10/2015   Post-transplant baseline sCr 1.2-1.4.     CMV infection [facility]) summer 2017   Valcyte per ID/Renal transplant team   Depression    Diverticulosis    a. 05/2012 colonoscopy   Erosive lichen planus of vulva    Topical steroids (managed by Dr. Ricka KIDD [facility].   ESRD (end stage renal disease) (HCC)    began dialysis 2014--followed by Dr. Perri.  Received deceased donor kidney transplant 10/2015.   Fatty liver 10/2021   with hepatomegaly   FSGS (focal segmental glomerulosclerosis)    right kidney; hx of left renal cell cancer and got nephrectomy 1993.   GERD (gastroesophageal reflux disease)    Gout    Heart murmur    (Diastolic) ECHO 08/2013 showed that this murmur is coming from pt's R arm AV fistula   Hiatal hernia    History of renal cell cancer 1993   Left nephrectomy   Hyperlipidemia    Hypertension    Hypothyroidism    Impingement syndrome of left shoulder 04/2016   Dr. Marsa to PT   Inguinal hernia    right   Kidney cancer, primary, with metastasis from kidney to other site [facility])    Lumbar spinal stenosis    Chronic LBP with bilat neurogenic claudication    Metatarsal fracture, pathologic 01/2018   Right; orthocarolina-->post op shoe continued, f/u x-ray planned.   Obesity    Osteoarthritis of left knee 08/2017   severe, diffuse, tricompartmental.  Responded to steroid injection.     Osteoporosis 06/07/2016   DEXA T-score of -3.1.  Fosamax  planned 2017 but dental work prevented start..  Pathological toe fx 12/2017--repeat DEXA 08/2018, T-score -3.3 radius. 12/2020 T score radius -3.7.   PAF (paroxysmal atrial fibrillation) (HCC)    dx 11/2022   Pneumonia    Renal transplant recipient 11/06/2015   Baseline Cr as of 09/2020 1.2-1.4   Simple hepatic cyst    2022   Solitary kidney, acquired    SVT (supraventricular tachycardia) (HCC)    UTI (urinary tract infection)    Ventral hernia    Vitamin B12 deficiency 12/2018   Starting replacement as of 12/11/2018    Past Surgical History:  Procedure Laterality Date   AV FISTULA PLACEMENT Right    Right arm: aneurismal dilatation 2017 being followed by CV surgeons   CARDIOVASCULAR STRESS TEST     03/10/15 ETT ([facility]): Exercise ECG negative at 83% max predicted HR.    CATARACT EXTRACTION     COLONOSCOPY  2003; 05/2012   2013 diverticulosis, no polyps.  03/10/23 ->  one adenoma and extensive diverticulosis as well as internal hemorrhoids   colonoscopy with polypectomy     Dr Abran   DEXA  06/07/2016; 08/22/2018; 12/30/20   2017 T-score -3.1.  2020 T score -3.3.  2022 T score -3.7   HYSTERECTOMY ABDOMINAL WITH SALPINGECTOMY     KIDNEY TRANSPLANT  12/04/15   Deceased donor kidney transplant, with Thymoglobulin induction   LIGATION OF ARTERIOVENOUS  FISTULA Right 02/14/2017   Procedure: LIGATION OF ARTERIOVENOUS  FISTULA;  Surgeon: Eliza Lonni RAMAN, MD;  Location: [facility] OR;  Service: Vascular;  Laterality: Right;   NEPHRECTOMY  1993   for malignancy- left   RENAL BIOPSY     right   RESECTION OF ARTERIOVENOUS FISTULA ANEURYSM Right 02/14/2017   Procedure: EXCISION OF RIGHT BRACHIOCEPHALIC  ARTERIOVENOUS FISTULA ANEURYSM;  Surgeon: Eliza Lonni RAMAN, MD;  Location: [facility] OR;  Service: Vascular;  Laterality: Right;   TEE WITHOUT CARDIOVERSION N/A 08/27/2013   Procedure: TRANSESOPHAGEAL ECHOCARDIOGRAM (TEE);  Surgeon: Maude JAYSON Emmer, MD;  Location: [facility] ENDOSCOPY;  Service: Cardiovascular;  Laterality: N/A;   TOTAL ABDOMINAL HYSTERECTOMY W/ BILATERAL SALPINGOOPHORECTOMY  1997   fibroids   TRANSTHORACIC ECHOCARDIOGRAM     03/10/15 echo ([facility]): LV cavity normal in size, focal basal hypertrophy, normal systolic function, EF 55% (visual est). Normal wall motion, no regional wall motion abnormalities. Mild diastolic dysfunction with normal LA chamber size. No significant valve stenosis or regurgitation.  12/2022 normal   VULVA / PERINEUM BIOPSY  2015    Outpatient Medications Prior to Visit  Medication Sig Dispense Refill   acetaminophen  (TYLENOL ) 500 MG tablet Take 1,000 mg by mouth every 8 (eight) hours as needed for moderate pain.     apixaban  (ELIQUIS ) 5 MG TABS tablet Take 1 tablet (5 mg total) by mouth 2 (two) times daily. 180 tablet 3   AYR SALINE NASAL DROPS NA Place 2 sprays into both nostrils daily.     benzonatate  (TESSALON ) 100 MG capsule Take 1 capsule (100 mg total) by mouth 3 (three) times daily as needed for cough. Do not take with alcohol or while operating or driving heavy machinery 21 capsule 0   Camphor-Eucalyptus-Menthol (VICKS VAPORUB EX) Apply 1 application topically daily.     Cyanocobalamin  (B-12 PO) Take 1 tablet by mouth daily.     doxazosin  (CARDURA ) 2 MG tablet TAKE 1 TABLET(2 MG) BY MOUTH TWICE DAILY 180 tablet 3   ferrous sulfate 325 (65 FE) MG EC tablet Take 325 mg by mouth daily with breakfast.     hydrocortisone  (ANUSOL -HC) 2.5 % rectal cream Place 1 Application rectally 2 (two) times daily. 30 g 1   levothyroxine  (SYNTHROID ) 125 MCG tablet TAKE 1 AND 1/2 TABLETS ON MONDAYS AND WEDNESDAYS AND FRIDAYS. 1 TABLET ALL OTHER DAYS 40  tablet 1   LORazepam  (ATIVAN ) 0.5 MG tablet Take 1 tablet (0.5 mg total) by mouth 2 (two) times daily as needed for anxiety. 30 tablet 1   losartan  (COZAAR ) 100 MG tablet Take 1 tablet (100 mg total) by mouth daily. 90 tablet 1   magnesium  chloride (SLOW-MAG) 64 MG TBEC SR tablet Take 2 tablets (128 mg total) by mouth daily. 180 tablet 1   mycophenolate  (MYFORTIC ) 360 MG TBEC EC tablet Take 360 mg by mouth 2 (two) times daily.     nystatin  powder Apply 1 Application topically once a week.     polyethylene glycol (MIRALAX  / GLYCOLAX ) 17 g packet Take 17 g by mouth daily. 14 each  0   silver  sulfADIAZINE  (SILVADENE ) 1 % cream Apply 1 Application topically daily.     sodium bicarbonate  650 MG tablet Take 1 tablet (650 mg total) by mouth 2 (two) times daily. Takes 1 in AM and 1 at bedtime 180 tablet 1   tacrolimus  (PROGRAF ) 1 MG capsule Take 2 mg by mouth 2 (two) times daily.     triamcinolone  cream (KENALOG ) 0.1 % Apply 1 Application topically 2 (two) times daily. 45 g 0   verapamil  (CALAN -SR) 120 MG CR tablet TAKE 1 TABLET BY MOUTH EVERY  AFTERNOON 90 tablet 1   verapamil  (CALAN -SR) 240 MG CR tablet Take 1 tablet (240 mg total) by mouth every morning. 90 tablet 1   Vitamin D , Ergocalciferol , (DRISDOL ) 1.25 MG (50000 UNIT) CAPS capsule 1 cap po two times per week 24 capsule 1   No facility-administered medications prior to visit.    Allergies  Allergen Reactions   Labetalol     Patient couldn't walk, low BP   Ceclor [Cefaclor] Rash   Naproxen Rash   Enalapril Maleate Cough    Vasotec   Metoprolol Tartrate Rash    On legs    Review of Systems  As per HPI  PE:    02/15/2024   11:09 AM 12/13/2023    8:05 AM 12/13/2023    8:04 AM  Vitals with BMI  Height 5' 5.75  5' 5.75  Weight 233 lbs  230 lbs 13 oz  BMI 37.9  37.54  Systolic 130 140 851  Diastolic 77 76 83  Pulse 75  74     Physical Exam  General: Alert, well-appearing. Tearful.  Thought and speech are lucid. No further  exam today  LABS:  Last CBC Lab Results  Component Value Date   WBC 6.5 12/13/2023   HGB 12.8 12/13/2023   HCT 40.3 12/13/2023   MCV 85.7 12/13/2023   MCH 29.3 06/08/2023   RDW 18.0 (H) 12/13/2023   PLT 267.0 12/13/2023   Lab Results  Component Value Date   IRON 50 12/13/2023   TIBC 365 12/13/2023   FERRITIN 12 (L) 12/13/2023   Last metabolic panel Lab Results  Component Value Date   GLUCOSE 102 (H) 12/13/2023   NA 140 12/13/2023   K 4.5 12/13/2023   CL 106 12/13/2023   CO2 26 12/13/2023   BUN 15 12/13/2023   CREATININE 0.91 12/13/2023   GFR 60.52 12/13/2023   CALCIUM  9.3 12/13/2023   PHOS 2.9 01/04/2018   PROT 6.2 (L) 06/02/2023   ALBUMIN 3.6 09/09/2023   BILITOT 0.8 06/02/2023   ALKPHOS 86 09/09/2023   AST 18 09/09/2023   ALT 11 09/09/2023   ANIONGAP 9 06/02/2023   Last thyroid  functions Lab Results  Component Value Date   TSH 3.310 09/12/2023   Last vitamin B12 and Folate Lab Results  Component Value Date   VITAMINB12 417 11/17/2022   IMPRESSION AND PLAN:  Grief reaction. Emotional support and reassurance given. She does have a history of iron deficiency and has been on iron supplement.  We will check iron level and CBC today.  An After Visit Summary was printed and given to the patient.  FOLLOW UP: Return for Keep appt already set for 8/7.  Signed:  Gerlene Hockey, MD           02/15/2024

## 2024-02-16 ENCOUNTER — Ambulatory Visit: Payer: Self-pay | Admitting: Family Medicine

## 2024-02-16 LAB — IRON,TIBC AND FERRITIN PANEL
%SAT: 50 % — ABNORMAL HIGH (ref 16–45)
Ferritin: 20 ng/mL (ref 16–288)
Iron: 170 ug/dL — ABNORMAL HIGH (ref 45–160)
TIBC: 340 ug/dL (ref 250–450)

## 2024-02-29 ENCOUNTER — Other Ambulatory Visit: Payer: Self-pay | Admitting: Family Medicine

## 2024-03-05 ENCOUNTER — Other Ambulatory Visit: Payer: Self-pay

## 2024-03-05 MED ORDER — DOXAZOSIN MESYLATE 2 MG PO TABS: 2.0000 mg | ORAL_TABLET | Freq: Two times a day (BID) | ORAL | 1 refills | Status: DC

## 2024-03-14 ENCOUNTER — Encounter: Payer: Self-pay | Admitting: Family Medicine

## 2024-03-14 ENCOUNTER — Ambulatory Visit (INDEPENDENT_AMBULATORY_CARE_PROVIDER_SITE_OTHER): Admitting: Family Medicine

## 2024-03-14 VITALS — BP 133/55 | HR 68 | Temp 98.5°F | Ht 65.75 in | Wt 233.8 lb

## 2024-03-14 DIAGNOSIS — L299 Pruritus, unspecified: Secondary | ICD-10-CM

## 2024-03-14 DIAGNOSIS — N1831 Chronic kidney disease, stage 3a: Secondary | ICD-10-CM | POA: Diagnosis not present

## 2024-03-14 DIAGNOSIS — I10 Essential (primary) hypertension: Secondary | ICD-10-CM

## 2024-03-14 DIAGNOSIS — E039 Hypothyroidism, unspecified: Secondary | ICD-10-CM

## 2024-03-14 LAB — TSH: TSH: 8.5 u[IU]/mL — ABNORMAL HIGH (ref 0.35–5.50)

## 2024-03-14 MED ORDER — NEOMYCIN-POLYMYXIN-HC 3.5-10000-1 OT SOLN: OTIC | 1 refills | Status: AC

## 2024-03-14 NOTE — Progress Notes (Signed)
 OFFICE VISIT  03/14/2024  CC:  Chief Complaint  Patient presents with   Medical Management of Chronic Issues    Pt is fasting    Patient is a 78 y.o. female who presents for 62-month follow-up chronic anxiety, hypertension, and chronic renal insufficiency stage III. A/P as of last visit: #1 chronic renal insufficiency stage III. History of renal transplant. GFR stable at 50 on 09/09/2023 ED visit. Continue current dosing of mycophenolate  and tacrolimus .  BMET today.   #2 chronic anxiety.  She uses lorazepam  pretty infrequently.   #3 hypertension, well-controlled on losartan  100 mg a day. BMET today.   #4 Anemia due to lower GI bleed 05/2023.   Suspected to be hemorrhoidal bleeding. She has been on iron sulfate 325 mg once daily Monitor CBC and iron panel today.  INTERIM HX: Iron labs normal 1 month ago. I recommended she go ahead and discontinue iron.  Tabitha Green is doing pretty well, grieving appropriately. She recently went to the beach with a friend. Sleep is difficult, but not much worse than her usual.  She rarely takes her lorazepam  for sleep or for anxiety. She has no physical complaints right now other than ears itching.   PMP AWARE reviewed today: most recent rx for lorazepam  was filled 12/13/2023, # 30, rx by me. No red flags.  ROS --> no fevers, no CP, no SOB, no wheezing, no cough, no dizziness, no HAs, no rashes, no melena/hematochezia.  No polyuria or polydipsia.  No myalgias or arthralgias.  No focal weakness, paresthesias, or tremors.  No acute vision or hearing abnormalities.  No dysuria or unusual/new urinary urgency or frequency.  No recent changes in lower legs. No n/v/d or abd pain.  No palpitations.    Past Medical History:  Diagnosis Date   Anemia    when I was on dyalysis   Anxiety    Arthritis    Cataract    Cholelithiasis 04/2021   noted on CT abd/pelv done for R lower abd swelling and MR abd   Chronic renal insufficiency, stage III (moderate)  (HCC) 10/2015   Post-transplant baseline sCr 1.2-1.4.     CMV infection [facility]) summer 2017   Valcyte per ID/Renal transplant team   Depression    Diverticulosis    a. 05/2012 colonoscopy   Erosive lichen planus of vulva    Topical steroids (managed by Tabitha. Ricka KIDD Green via [facility].   ESRD (end stage renal disease) (HCC)    began dialysis 2014--followed by Tabitha. Perri.  Received deceased donor kidney transplant 10/2015.   Fatty liver 10/2021   with hepatomegaly   FSGS (focal segmental glomerulosclerosis)    right kidney; hx of left renal cell cancer and got nephrectomy 1993.   GERD (gastroesophageal reflux disease)    Gout    Heart murmur    (Diastolic) ECHO 08/2013 showed that this murmur is coming from pt's R arm AV fistula   Hiatal hernia    History of renal cell cancer 1993   Left nephrectomy   Hyperlipidemia    Hypertension    Hypothyroidism    Impingement syndrome of left shoulder 04/2016   Tabitha. Marsa to PT   Inguinal hernia    right   Kidney cancer, primary, with metastasis from kidney to other site [facility])    Lumbar spinal stenosis    Chronic LBP with bilat neurogenic claudication   Metatarsal fracture, pathologic 01/2018   Right; orthocarolina-->post op shoe continued, f/u x-ray planned.  Obesity    Osteoarthritis of left knee 08/2017   severe, diffuse, tricompartmental.  Responded to steroid injection.     Osteoporosis 06/07/2016   DEXA T-score of -3.1.  Fosamax  planned 2017 but dental work prevented start..  Pathological toe fx 12/2017--repeat DEXA 08/2018, T-score -3.3 radius. 12/2020 T score radius -3.7.   PAF (paroxysmal atrial fibrillation) (HCC)    dx 11/2022   Pneumonia    Renal transplant recipient 08-Nov-2015   Baseline Cr as of 09/2020 1.2-1.4   Simple hepatic cyst    2022   Solitary kidney, acquired    SVT (supraventricular tachycardia) (HCC)    UTI (urinary tract infection)    Ventral hernia    Vitamin B12  deficiency 12/2018   Starting replacement as of 12/11/2018    Past Surgical History:  Procedure Laterality Date   AV FISTULA PLACEMENT Right    Right arm: aneurismal dilatation 2017 being followed by CV surgeons   CARDIOVASCULAR STRESS TEST     03/10/15 ETT ([facility]): Exercise ECG negative at 83% max predicted HR.    CATARACT EXTRACTION     COLONOSCOPY  2003; 05/2012   2013 diverticulosis, no polyps.  03/10/23 ->one adenoma and extensive diverticulosis as well as internal hemorrhoids   colonoscopy with polypectomy     Tabitha Green   DEXA  06/07/2016; 08/22/2018; 12/30/20   2017 T-score -3.1.  2020 T score -3.3.  2022 T score -3.7   HYSTERECTOMY ABDOMINAL WITH SALPINGECTOMY     KIDNEY TRANSPLANT  11/08/2015   Deceased donor kidney transplant, with Thymoglobulin induction   LIGATION OF ARTERIOVENOUS  FISTULA Right 02/14/2017   Procedure: LIGATION OF ARTERIOVENOUS  FISTULA;  Surgeon: Tabitha Lonni RAMAN, MD;  Location: [facility] OR;  Service: Vascular;  Laterality: Right;   NEPHRECTOMY  1993   for malignancy- left   RENAL BIOPSY     right   RESECTION OF ARTERIOVENOUS FISTULA ANEURYSM Right 02/14/2017   Procedure: EXCISION OF RIGHT BRACHIOCEPHALIC ARTERIOVENOUS FISTULA ANEURYSM;  Surgeon: Tabitha Lonni RAMAN, MD;  Location: [facility] OR;  Service: Vascular;  Laterality: Right;   TEE WITHOUT CARDIOVERSION N/A 08/27/2013   Procedure: TRANSESOPHAGEAL ECHOCARDIOGRAM (TEE);  Surgeon: Tabitha Tabitha Emmer, MD;  Location: [facility] ENDOSCOPY;  Service: Cardiovascular;  Laterality: N/A;   TOTAL ABDOMINAL HYSTERECTOMY W/ BILATERAL SALPINGOOPHORECTOMY  1997   fibroids   TRANSTHORACIC ECHOCARDIOGRAM     03/10/15 echo ([facility]): LV cavity normal in size, focal basal hypertrophy, normal systolic function, EF 55% (visual est). Normal wall motion, no regional wall motion abnormalities. Mild diastolic dysfunction with normal LA chamber size. No significant valve stenosis or regurgitation.  12/2022 normal    VULVA / PERINEUM BIOPSY  2015    Outpatient Medications Prior to Visit  Medication Sig Dispense Refill   acetaminophen  (TYLENOL ) 500 MG tablet Take 1,000 mg by mouth every 8 (eight) hours as needed for moderate pain.     apixaban  (ELIQUIS ) 5 MG TABS tablet Take 1 tablet (5 mg total) by mouth 2 (two) times daily. 180 tablet 3   AYR SALINE NASAL DROPS NA Place 2 sprays into both nostrils daily.     Camphor-Eucalyptus-Menthol (VICKS VAPORUB EX) Apply 1 application topically daily.     Cyanocobalamin  (B-12 PO) Take 1 tablet by mouth daily.     doxazosin  (CARDURA ) 2 MG tablet Take 1 tablet (2 mg total) by mouth 2 (two) times daily. 180 tablet 1   ferrous sulfate 325 (65 FE) MG EC tablet Take 325 mg by  mouth daily with breakfast.     levothyroxine  (SYNTHROID ) 125 MCG tablet TAKE 1 AND A ONE-HALF TABLET BY MOUTH ON MONDAY AND WEDNESDAY AND FRIDAY. TAKE 1 BY MOUTH ALL OTHER DAYS 112 tablet 0   LORazepam  (ATIVAN ) 0.5 MG tablet Take 1 tablet (0.5 mg total) by mouth 2 (two) times daily as needed for anxiety. 30 tablet 1   losartan  (COZAAR ) 100 MG tablet Take 1 tablet (100 mg total) by mouth daily. 90 tablet 1   magnesium  chloride (SLOW-MAG) 64 MG TBEC SR tablet Take 2 tablets (128 mg total) by mouth daily. 180 tablet 1   mycophenolate  (MYFORTIC ) 360 MG TBEC EC tablet Take 360 mg by mouth 2 (two) times daily.     nystatin  powder Apply 1 Application topically once a week.     polyethylene glycol (MIRALAX  / GLYCOLAX ) 17 g packet Take 17 g by mouth daily. 14 each 0   silver  sulfADIAZINE  (SILVADENE ) 1 % cream Apply 1 Application topically daily.     sodium bicarbonate  650 MG tablet Take 1 tablet (650 mg total) by mouth 2 (two) times daily. Takes 1 in AM and 1 at bedtime 180 tablet 1   tacrolimus  (PROGRAF ) 1 MG capsule Take 2 mg by mouth 2 (two) times daily.     triamcinolone  cream (KENALOG ) 0.1 % Apply 1 Application topically 2 (two) times daily. 45 g 0   verapamil  (CALAN -SR) 240 MG CR tablet Take 1 tablet  (240 mg total) by mouth every morning. 90 tablet 1   Vitamin D , Ergocalciferol , (DRISDOL ) 1.25 MG (50000 UNIT) CAPS capsule 1 cap po two times per week 24 capsule 1   verapamil  (CALAN -SR) 120 MG CR tablet TAKE 1 TABLET BY MOUTH EVERY  AFTERNOON (Patient not taking: Reported on 03/14/2024) 90 tablet 1   benzonatate  (TESSALON ) 100 MG capsule Take 1 capsule (100 mg total) by mouth 3 (three) times daily as needed for cough. Do not take with alcohol or while operating or driving heavy machinery 21 capsule 0   hydrocortisone  (ANUSOL -HC) 2.5 % rectal cream Place 1 Application rectally 2 (two) times daily. 30 g 1   No facility-administered medications prior to visit.    Allergies  Allergen Reactions   Labetalol     Patient couldn't walk, low BP   Ceclor [Cefaclor] Rash   Naproxen Rash   Enalapril Maleate Cough    Vasotec   Metoprolol Tartrate Rash    On legs    Review of Systems As per HPI  PE:    03/14/2024    9:28 AM 02/15/2024   11:09 AM 12/13/2023    8:05 AM  Vitals with BMI  Height 5' 5.75 5' 5.75   Weight 233 lbs 13 oz 233 lbs   BMI 38.03 37.9   Systolic 133 130 859  Diastolic 55 77 76  Pulse 68 75      Physical Exam  General: Alert and well-appearing. Affect: Pleasant, lucid thought and speech. Ears: Slight flakiness of the epithelium of the auditory canals.  No swelling or erythema of the canal. Tympanic membranes appear normal. No further exam today.  LABS:  Last CBC Lab Results  Component Value Date   WBC 6.3 02/15/2024   HGB 13.7 02/15/2024   HCT 42.1 02/15/2024   MCV 89.2 02/15/2024   MCH 29.3 06/08/2023   RDW 16.9 (H) 02/15/2024   PLT 222.0 02/15/2024   Lab Results  Component Value Date   IRON 170 (H) 02/15/2024   TIBC 340 02/15/2024  FERRITIN 20 02/15/2024   Last metabolic panel Lab Results  Component Value Date   GLUCOSE 102 (H) 12/13/2023   NA 140 12/13/2023   K 4.5 12/13/2023   CL 106 12/13/2023   CO2 26 12/13/2023   BUN 15 12/13/2023    CREATININE 0.91 12/13/2023   GFR 60.52 12/13/2023   CALCIUM  9.3 12/13/2023   PHOS 2.9 01/04/2018   PROT 6.2 (L) 06/02/2023   ALBUMIN 3.6 09/09/2023   BILITOT 0.8 06/02/2023   ALKPHOS 86 09/09/2023   AST 18 09/09/2023   ALT 11 09/09/2023   ANIONGAP 9 06/02/2023   Last lipids Lab Results  Component Value Date   CHOL 130 09/15/2023   HDL 42.20 09/15/2023   LDLCALC 62 09/15/2023   LDLDIRECT 166.3 10/02/2007   TRIG 131.0 09/15/2023   CHOLHDL 3 09/15/2023   Last hemoglobin A1c Lab Results  Component Value Date   HGBA1C 5.4 02/23/2012   Last thyroid  functions Lab Results  Component Value Date   TSH 3.310 09/12/2023   Last vitamin D  Lab Results  Component Value Date   VD25OH 37.90 09/15/2023   Last vitamin B12 and Folate Lab Results  Component Value Date   VITAMINB12 417 11/17/2022   IMPRESSION AND PLAN:  1 chronic renal insufficiency stage III. History of renal transplant. 3 months ago her GFR was 60, stable. Continue current dosing of mycophenolate  and tacrolimus .  BMET today.   #2 chronic anxiety.  She uses lorazepam  pretty infrequently.   #3 hypertension, well-controlled on losartan  100 mg a day. BMET today.   #4 Anemia due to lower GI bleed 05/2023.   Suspected to be hemorrhoidal bleeding. She took oral iron supplement up until last visit, at which time her iron level and hemoglobin were normal.  #5 mild nonspecific dermatitis of the external auditory canals. Cortisporin otic drops prescription done today, 2 drops twice a day in affected ear as needed.  #6 acquired hypothyroidism. Well-controlled on 125 mcg tabs of levothyroxine , 1-1/2 tab daily alternating with 1 tab daily. TSH today.  An After Visit Summary was printed and given to the patient.  FOLLOW UP: Return in about 3 months (around 06/14/2024) for routine chronic illness f/u.  Signed:  Gerlene Hockey, MD           03/14/2024

## 2024-03-14 NOTE — Patient Instructions (Signed)

## 2024-03-15 ENCOUNTER — Ambulatory Visit: Payer: Self-pay | Admitting: Family Medicine

## 2024-03-15 DIAGNOSIS — E039 Hypothyroidism, unspecified: Secondary | ICD-10-CM

## 2024-03-15 LAB — BASIC METABOLIC PANEL WITH GFR
BUN: 16 mg/dL (ref 6–23)
CO2: 26 meq/L (ref 19–32)
Calcium: 9.1 mg/dL (ref 8.4–10.5)
Chloride: 107 meq/L (ref 96–112)
Creatinine, Ser: 0.9 mg/dL (ref 0.40–1.20)
GFR: 61.22 mL/min (ref >60.00)
Glucose, Bld: 102 mg/dL — ABNORMAL HIGH (ref 70–99)
Potassium: 4.4 meq/L (ref 3.5–5.1)
Sodium: 143 meq/L (ref 135–145)

## 2024-04-09 DIAGNOSIS — N051 Unspecified nephritic syndrome with focal and segmental glomerular lesions: Secondary | ICD-10-CM | POA: Diagnosis not present

## 2024-04-09 DIAGNOSIS — K769 Liver disease, unspecified: Secondary | ICD-10-CM | POA: Diagnosis not present

## 2024-04-09 DIAGNOSIS — Z940 Kidney transplant status: Secondary | ICD-10-CM | POA: Diagnosis not present

## 2024-04-11 ENCOUNTER — Encounter: Payer: Self-pay | Admitting: Family Medicine

## 2024-04-11 ENCOUNTER — Ambulatory Visit (INDEPENDENT_AMBULATORY_CARE_PROVIDER_SITE_OTHER): Admitting: Family Medicine

## 2024-04-11 ENCOUNTER — Ambulatory Visit: Payer: Self-pay | Admitting: Family Medicine

## 2024-04-11 VITALS — BP 124/76 | HR 74 | Temp 97.3°F | Wt 235.8 lb

## 2024-04-11 DIAGNOSIS — M25562 Pain in left knee: Secondary | ICD-10-CM

## 2024-04-11 DIAGNOSIS — E039 Hypothyroidism, unspecified: Secondary | ICD-10-CM

## 2024-04-11 DIAGNOSIS — M1712 Unilateral primary osteoarthritis, left knee: Secondary | ICD-10-CM | POA: Diagnosis not present

## 2024-04-11 DIAGNOSIS — N1831 Chronic kidney disease, stage 3a: Secondary | ICD-10-CM | POA: Diagnosis not present

## 2024-04-11 DIAGNOSIS — G8929 Other chronic pain: Secondary | ICD-10-CM

## 2024-04-11 DIAGNOSIS — R5383 Other fatigue: Secondary | ICD-10-CM

## 2024-04-11 DIAGNOSIS — E538 Deficiency of other specified B group vitamins: Secondary | ICD-10-CM

## 2024-04-11 LAB — TSH: TSH: 0.08 u[IU]/mL — ABNORMAL LOW (ref 0.35–5.50)

## 2024-04-11 LAB — VITAMIN B12: Vitamin B-12: 211 pg/mL (ref 211–911)

## 2024-04-11 NOTE — Progress Notes (Signed)
 OFFICE VISIT  04/11/2024  CC:  Chief Complaint  Patient presents with  . Medical Management of Chronic Issues    Patient is a 78 y.o. female who presents for fatigue.  HPI: She describes nonspecific malaise lately. Mild generalized achiness.  No joint swelling, no rash, no fever. She does not monitor her BP at home. Struggles with chronic insomnia, no recent changes.  No change in her chronic anxiety.  She is grieving the loss of her husband earlier this year.  Her left knee hurts more for the last several months.  Only hurts with standing and trying to walk. Causes significant limp.  Does not swell or turn red or feel hot. She has not seen a specialist for her knee. Past plain films of left knee have documented severe tricompartmental degenerative arthritis.  I did 2 steroid injections into the left knee about 10 months apart back in 2019 and these did provide significant relief.  1 month ago her TSH was a little elevated.  I recommended she take 1-1/2 of her 125 mcg levothyroxine  tabs every day. The remainder of her labs were excellent.  Past Medical History:  Diagnosis Date  . Anemia    when I was on dyalysis  . Anxiety   . Arthritis   . Cataract   . Cholelithiasis 04/2021   noted on CT abd/pelv done for R lower abd swelling and MR abd  . Chronic renal insufficiency, stage III (moderate) (HCC) 10/2015   Post-transplant baseline sCr 1.2-1.4.    . CMV infection [facility]) summer 2017   Valcyte per ID/Renal transplant team  . Depression   . Diverticulosis    a. 05/2012 colonoscopy  . Erosive lichen planus of vulva    Topical steroids (managed by Dr. Ricka KIDD [facility] via [facility].  . ESRD (end stage renal disease) (HCC)    began dialysis 2014--followed by Dr. Perri.  Received deceased donor kidney transplant 10/2015.  SABRA Fatty liver 10/2021   with hepatomegaly  . FSGS (focal segmental glomerulosclerosis)    right kidney; hx of left renal cell  cancer and got nephrectomy 1993.  SABRA GERD (gastroesophageal reflux disease)   . Gout   . Heart murmur    (Diastolic) ECHO 08/2013 showed that this murmur is coming from pt's R arm AV fistula  . Hiatal hernia   . History of renal cell cancer 1993   Left nephrectomy  . Hyperlipidemia   . Hypertension   . Hypothyroidism   . Impingement syndrome of left shoulder 04/2016   Dr. Marsa to PT  . Inguinal hernia    right  . Kidney cancer, primary, with metastasis from kidney to other site [facility])   . Lumbar spinal stenosis    Chronic LBP with bilat neurogenic claudication  . Metatarsal fracture, pathologic 01/2018   Right; orthocarolina-->post op shoe continued, f/u x-ray planned.  . Obesity   . Osteoarthritis of left knee 08/2017   severe, diffuse, tricompartmental.  Responded to steroid injection.    . Osteoporosis 06/07/2016   DEXA T-score of -3.1.  Fosamax  planned 2017 but dental work prevented start..  Pathological toe fx 12/2017--repeat DEXA 08/2018, T-score -3.3 radius. 12/2020 T score radius -3.7.  SABRA PAF (paroxysmal atrial fibrillation) (HCC)    dx 11/2022  . Pneumonia   . Renal transplant recipient 11/06/2015   Baseline Cr as of 09/2020 1.2-1.4  . Simple hepatic cyst    2022  . Solitary kidney, acquired   . SVT (supraventricular  tachycardia) (HCC)   . UTI (urinary tract infection)   . Ventral hernia   . Vitamin B12 deficiency 12/2018   Starting replacement as of 12/11/2018    Past Surgical History:  Procedure Laterality Date  . AV FISTULA PLACEMENT Right    Right arm: aneurismal dilatation 2017 being followed by CV surgeons  . CARDIOVASCULAR STRESS TEST     03/10/15 ETT ([facility]): Exercise ECG negative at 83% max predicted HR.   SABRA CATARACT EXTRACTION    . COLONOSCOPY  2003; 05/2012   2013 diverticulosis, no polyps.  03/10/23 ->one adenoma and extensive diverticulosis as well as internal hemorrhoids  . colonoscopy with polypectomy     Dr Abran  . DEXA  06/07/2016;  08/22/2018; 12/30/20   2017 T-score -3.1.  2020 T score -3.3.  2022 T score -3.7  . HYSTERECTOMY ABDOMINAL WITH SALPINGECTOMY    . KIDNEY TRANSPLANT  11/24/15   Deceased donor kidney transplant, with Thymoglobulin induction  . LIGATION OF ARTERIOVENOUS  FISTULA Right 02/14/2017   Procedure: LIGATION OF ARTERIOVENOUS  FISTULA;  Surgeon: Eliza Lonni RAMAN, MD;  Location[facility] OR;  Service: Vascular;  Laterality: Right;  . NEPHRECTOMY  1993   for malignancy- left  . RENAL BIOPSY     right  . RESECTION OF ARTERIOVENOUS FISTULA ANEURYSM Right 02/14/2017   Procedure: EXCISION OF RIGHT BRACHIOCEPHALIC ARTERIOVENOUS FISTULA ANEURYSM;  Surgeon: Eliza Lonni RAMAN, MD;  Location: [facility] OR;  Service: Vascular;  Laterality: Right;  . TEE WITHOUT CARDIOVERSION N/A 08/27/2013   Procedure: TRANSESOPHAGEAL ECHOCARDIOGRAM (TEE);  Surgeon: Maude JAYSON Emmer, MD;  Location: [facility] ENDOSCOPY;  Service: Cardiovascular;  Laterality: N/A;  . TOTAL ABDOMINAL HYSTERECTOMY W/ BILATERAL SALPINGOOPHORECTOMY  1997   fibroids  . TRANSTHORACIC ECHOCARDIOGRAM     03/10/15 echo ([facility]): LV cavity normal in size, focal basal hypertrophy, normal systolic function, EF 55% (visual est). Normal wall motion, no regional wall motion abnormalities. Mild diastolic dysfunction with normal LA chamber size. No significant valve stenosis or regurgitation.  12/2022 normal  . VULVA / PERINEUM BIOPSY  2015    Outpatient Medications Prior to Visit  Medication Sig Dispense Refill  . acetaminophen  (TYLENOL ) 500 MG tablet Take 1,000 mg by mouth every 8 (eight) hours as needed for moderate pain.    . apixaban  (ELIQUIS ) 5 MG TABS tablet Take 1 tablet (5 mg total) by mouth 2 (two) times daily. 180 tablet 3  . AYR SALINE NASAL DROPS NA Place 2 sprays into both nostrils daily.    . Camphor-Eucalyptus-Menthol (VICKS VAPORUB EX) Apply 1 application topically daily.    . Cyanocobalamin  (B-12 PO) Take 1 tablet by mouth daily.     . doxazosin  (CARDURA ) 2 MG tablet Take 1 tablet (2 mg total) by mouth 2 (two) times daily. 180 tablet 1  . levothyroxine  (SYNTHROID ) 125 MCG tablet TAKE 1 AND A ONE-HALF TABLET BY MOUTH ON MONDAY AND WEDNESDAY AND FRIDAY. TAKE 1 BY MOUTH ALL OTHER DAYS 112 tablet 0  . LORazepam  (ATIVAN ) 0.5 MG tablet Take 1 tablet (0.5 mg total) by mouth 2 (two) times daily as needed for anxiety. 30 tablet 1  . losartan  (COZAAR ) 100 MG tablet Take 1 tablet (100 mg total) by mouth daily. 90 tablet 1  . magnesium  chloride (SLOW-MAG) 64 MG TBEC SR tablet Take 2 tablets (128 mg total) by mouth daily. 180 tablet 1  . mycophenolate  (MYFORTIC ) 360 MG TBEC EC tablet Take 360 mg by mouth 2 (two) times daily.    SABRA  neomycin -polymyxin-hydrocortisone  (CORTISPORIN) OTIC solution 2 drops in affected ear twice a day as needed for itching 10 mL 1  . nystatin  powder Apply 1 Application topically once a week.    . silver  sulfADIAZINE  (SILVADENE ) 1 % cream Apply 1 Application topically daily.    . sodium bicarbonate  650 MG tablet Take 1 tablet (650 mg total) by mouth 2 (two) times daily. Takes 1 in AM and 1 at bedtime 180 tablet 1  . tacrolimus  (PROGRAF ) 1 MG capsule Take 2 mg by mouth 2 (two) times daily.    . triamcinolone  cream (KENALOG ) 0.1 % Apply 1 Application topically 2 (two) times daily. 45 g 0  . verapamil  (CALAN -SR) 120 MG CR tablet TAKE 1 TABLET BY MOUTH EVERY  AFTERNOON (Patient not taking: Reported on 04/11/2024) 90 tablet 1  . verapamil  (CALAN -SR) 240 MG CR tablet Take 1 tablet (240 mg total) by mouth every morning. 90 tablet 1  . Vitamin D , Ergocalciferol , (DRISDOL ) 1.25 MG (50000 UNIT) CAPS capsule 1 cap po two times per week 24 capsule 1  . ferrous sulfate 325 (65 FE) MG EC tablet Take 325 mg by mouth daily with breakfast.    . polyethylene glycol (MIRALAX  / GLYCOLAX ) 17 g packet Take 17 g by mouth daily. 14 each 0   No facility-administered medications prior to visit.    Allergies  Allergen Reactions  .  Labetalol     Patient couldn't walk, low BP  . Ceclor [Cefaclor] Rash  . Naproxen Rash  . Enalapril Maleate Cough    Vasotec  . Metoprolol Tartrate Rash    On legs    Review of Systems  As per HPI  PE:    04/11/2024   10:21 AM 04/11/2024    9:51 AM 03/14/2024    9:28 AM  Vitals with BMI  Height   5' 5.75  Weight  235 lbs 13 oz 233 lbs 13 oz  BMI  38.35 38.03  Systolic 124 157 866  Diastolic 76 61 55  Pulse  74 68     Physical Exam  Gen: Alert, well appearing.  Patient is oriented to person, place, time, and situation. AFFECT: pleasant, lucid thought and speech. CV: RRR, no m/r/g.   LUNGS: CTA bilat, nonlabored resps, good aeration in all lung fields. Extremities: No edema Left knee without soft tissue swelling, erythema, warmth, or tenderness.  She has some hypertrophic changes to the patella.  Mild crepitus with full flexion.  Range of motion is intact and there is no knee instability.   LABS:  Last CBC Lab Results  Component Value Date   WBC 6.3 02/15/2024   HGB 13.7 02/15/2024   HCT 42.1 02/15/2024   MCV 89.2 02/15/2024   MCH 29.3 06/08/2023   RDW 16.9 (H) 02/15/2024   PLT 222.0 02/15/2024   Lab Results  Component Value Date   IRON 170 (H) 02/15/2024   TIBC 340 02/15/2024   FERRITIN 20 02/15/2024   Last metabolic panel Lab Results  Component Value Date   GLUCOSE 102 (H) 03/14/2024   NA 143 03/14/2024   K 4.4 03/14/2024   CL 107 03/14/2024   CO2 26 03/14/2024   BUN 16 03/14/2024   CREATININE 0.90 03/14/2024   GFR 61.22 03/14/2024   CALCIUM  9.1 03/14/2024   PHOS 2.9 01/04/2018   PROT 6.2 (L) 06/02/2023   ALBUMIN 3.6 09/09/2023   BILITOT 0.8 06/02/2023   ALKPHOS 86 09/09/2023   AST 18 09/09/2023   ALT 11 09/09/2023  ANIONGAP 9 06/02/2023   Last lipids Lab Results  Component Value Date   CHOL 130 09/15/2023   HDL 42.20 09/15/2023   LDLCALC 62 09/15/2023   LDLDIRECT 166.3 10/02/2007   TRIG 131.0 09/15/2023   CHOLHDL 3 09/15/2023   Last  hemoglobin A1c Lab Results  Component Value Date   HGBA1C 5.4 02/23/2012   Last thyroid  functions Lab Results  Component Value Date   TSH 8.50 (H) 03/14/2024   Last vitamin D  Lab Results  Component Value Date   VD25OH 37.90 09/15/2023   Last vitamin B12 and Folate Lab Results  Component Value Date   VITAMINB12 417 11/17/2022   IMPRESSION AND PLAN:  #1 nonspecific malaise and achiness. Reassured at this time.  She states she got labs at her nephrologist about a week ago and was told they were all stable (including CBC metabolic panel, and urine protein). Will check TSH and vitamin B12 level today.  #2 chronic left knee pain.  She has tricompartmental osteoarthritis.  Steroid injections helped back in 2019. She will return for steroid injection at her earliest convenience. She does not want to proceed with specialist referral at this time.  An After Visit Summary was printed and given to the patient.  FOLLOW UP: Return for return at your convenience for left knee injection.  Signed:  Gerlene Hockey, MD           04/11/2024

## 2024-04-17 ENCOUNTER — Ambulatory Visit: Admitting: Family Medicine

## 2024-04-17 NOTE — Progress Notes (Deleted)
 OFFICE VISIT  04/17/2024  CC: No chief complaint on file.   Patient is a 78 y.o. female who presents for left knee injection. I saw her in the office 6 days ago.  She has known left knee tricompartmental osteoarthritis. Her last steroid injection was 5 years ago. She wants to proceed with steroid injection today.  Past Medical History:  Diagnosis Date   Anemia    when I was on dyalysis   Anxiety    Arthritis    Cataract    Cholelithiasis 04/2021   noted on CT abd/pelv done for R lower abd swelling and MR abd   Chronic renal insufficiency, stage III (moderate) (HCC) 10/2015   Post-transplant baseline sCr 1.2-1.4.     CMV infection [facility]) summer 2017   Valcyte per ID/Renal transplant team   Depression    Diverticulosis    a. 05/2012 colonoscopy   Erosive lichen planus of vulva    Topical steroids (managed by Dr. Ricka K[facility]D [facility].   ESRD (end stage renal disease) (HCC)    began dialysis 2014--followed by Dr. Perri.  Received deceased donor kidney transplant 10/2015.   Fatty liver 10/2021   with hepatomegaly   FSGS (focal segmental glomerulosclerosis)    right kidney; hx of left renal cell cancer and got nephrectomy 1993.   GERD (gastroesophageal reflux disease)    Gout    Heart murmur    (Diastolic) ECHO 08/2013 showed that this murmur is coming from pt's R arm AV fistula   Hiatal hernia    History of renal cell cancer 1993   Left nephrectomy   Hyperlipidemia    Hypertension    Hypothyroidism    Impingement syndrome of left shoulder 04/2016   Dr. Marsa to PT   Inguinal hernia    right   Kidney cancer, primary, with metastasis from kidney to other site [facility])    Lumbar spinal stenosis    Chronic LBP with bilat neurogenic claudication   Metatarsal fracture, pathologic 01/2018   Right; orthocarolina-->post op shoe continued, f/u x-ray planned.   Obesity    Osteoarthritis of left knee 08/2017   severe, diffuse,  tricompartmental.  Responded to steroid injection.     Osteoporosis 06/07/2016   DEXA T-score of -3.1.  Fosamax  planned 2017 but dental work prevented start..  Pathological toe fx 12/2017--repeat DEXA 08/2018, T-score -3.3 radius. 12/2020 T score radius -3.7.   PAF (paroxysmal atrial fibrillation) (HCC)    dx 11/2022   Pneumonia    Renal transplant recipient 11/06/2015   Baseline Cr as of 09/2020 1.2-1.4   Simple hepatic cyst    2022   Solitary kidney, acquired    SVT (supraventricular tachycardia) (HCC)    UTI (urinary tract infection)    Ventral hernia    Vitamin B12 deficiency 12/2018   Starting replacement as of 12/11/2018    Past Surgical History:  Procedure Laterality Date   AV FISTULA PLACEMENT Right    Right arm: aneurismal dilatation 2017 being followed by CV surgeons   CARDIOVASCULAR STRESS TEST     03/10/15 ETT ([facility]): Exercise ECG negative at 83% max predicted HR.    CATARACT EXTRACTION     COLONOSCOPY  2003; 05/2012   2013 diverticulosis, no polyps.  03/10/23 ->one adenoma and extensive diverticulosis as well as internal hemorrhoids   colonoscopy with polypectomy     Dr Abran   DEXA  06/07/2016; 08/22/2018; 12/30/20   2017 T-score -3.1.  2020 T  score -3.3.  2022 T score -3.7   HYSTERECTOMY ABDOMINAL WITH SALPINGECTOMY     KIDNEY TRANSPLANT  18-Nov-2015   Deceased donor kidney transplant, with Thymoglobulin induction   LIGATION OF ARTERIOVENOUS  FISTULA Right 02/14/2017   Procedure: LIGATION OF ARTERIOVENOUS  FISTULA;  Surgeon: Eliza Lonni RAMAN, MD;  Location: [facility] OR;  Service: Vascular;  Laterality: Right;   NEPHRECTOMY  1993   for malignancy- left   RENAL BIOPSY     right   RESECTION OF ARTERIOVENOUS FISTULA ANEURYSM Right 02/14/2017   Procedure: EXCISION OF RIGHT BRACHIOCEPHALIC ARTERIOVENOUS FISTULA ANEURYSM;  Surgeon: Eliza Lonni RAMAN, MD;  Location: [facility] OR;  Service: Vascular;  Laterality: Right;   TEE WITHOUT CARDIOVERSION N/A 08/27/2013   Procedure:  TRANSESOPHAGEAL ECHOCARDIOGRAM (TEE);  Surgeon: Maude JAYSON Emmer, MD;  Location: [facility] ENDOSCOPY;  Service: Cardiovascular;  Laterality: N/A;   TOTAL ABDOMINAL HYSTERECTOMY W/ BILATERAL SALPINGOOPHORECTOMY  1997   fibroids   TRANSTHORACIC ECHOCARDIOGRAM     03/10/15 echo ([facility]): LV cavity normal in size, focal basal hypertrophy, normal systolic function, EF 55% (visual est). Normal wall motion, no regional wall motion abnormalities. Mild diastolic dysfunction with normal LA chamber size. No significant valve stenosis or regurgitation.  12/2022 normal   VULVA / PERINEUM BIOPSY  2015    Outpatient Medications Prior to Visit  Medication Sig Dispense Refill   acetaminophen  (TYLENOL ) 500 MG tablet Take 1,000 mg by mouth every 8 (eight) hours as needed for moderate pain.     apixaban  (ELIQUIS ) 5 MG TABS tablet Take 1 tablet (5 mg total) by mouth 2 (two) times daily. 180 tablet 3   AYR SALINE NASAL DROPS NA Place 2 sprays into both nostrils daily.     Camphor-Eucalyptus-Menthol (VICKS VAPORUB EX) Apply 1 application topically daily.     Cyanocobalamin  (B-12 PO) Take 1 tablet by mouth daily.     doxazosin  (CARDURA ) 2 MG tablet Take 1 tablet (2 mg total) by mouth 2 (two) times daily. 180 tablet 1   levothyroxine  (SYNTHROID ) 125 MCG tablet TAKE 1 AND A ONE-HALF TABLET BY MOUTH ON MONDAY AND WEDNESDAY AND FRIDAY. TAKE 1 BY MOUTH ALL OTHER DAYS 112 tablet 0   LORazepam  (ATIVAN ) 0.5 MG tablet Take 1 tablet (0.5 mg total) by mouth 2 (two) times daily as needed for anxiety. 30 tablet 1   losartan  (COZAAR ) 100 MG tablet Take 1 tablet (100 mg total) by mouth daily. 90 tablet 1   magnesium  chloride (SLOW-MAG) 64 MG TBEC SR tablet Take 2 tablets (128 mg total) by mouth daily. 180 tablet 1   mycophenolate  (MYFORTIC ) 360 MG TBEC EC tablet Take 360 mg by mouth 2 (two) times daily.     neomycin -polymyxin-hydrocortisone  (CORTISPORIN) OTIC solution 2 drops in affected ear twice a day as needed for  itching 10 mL 1   nystatin  powder Apply 1 Application topically once a week.     silver  sulfADIAZINE  (SILVADENE ) 1 % cream Apply 1 Application topically daily.     sodium bicarbonate  650 MG tablet Take 1 tablet (650 mg total) by mouth 2 (two) times daily. Takes 1 in AM and 1 at bedtime 180 tablet 1   tacrolimus  (PROGRAF ) 1 MG capsule Take 2 mg by mouth 2 (two) times daily.     triamcinolone  cream (KENALOG ) 0.1 % Apply 1 Application topically 2 (two) times daily. 45 g 0   verapamil  (CALAN -SR) 120 MG CR tablet TAKE 1 TABLET BY MOUTH EVERY  AFTERNOON (Patient not taking: Reported  on 04/11/2024) 90 tablet 1   verapamil  (CALAN -SR) 240 MG CR tablet Take 1 tablet (240 mg total) by mouth every morning. 90 tablet 1   Vitamin D , Ergocalciferol , (DRISDOL ) 1.25 MG (50000 UNIT) CAPS capsule 1 cap po two times per week 24 capsule 1   No facility-administered medications prior to visit.    Allergies  Allergen Reactions   Labetalol     Patient couldn't walk, low BP   Ceclor [Cefaclor] Rash   Naproxen Rash   Enalapril Maleate Cough    Vasotec   Metoprolol Tartrate Rash    On legs    Review of Systems  As per HPI  PE:    04/11/2024   10:21 AM 04/11/2024    9:51 AM 03/14/2024    9:28 AM  Vitals with BMI  Height   5' 5.75  Weight  235 lbs 13 oz 233 lbs 13 oz  BMI  38.35 38.03  Systolic 124 157 866  Diastolic 76 61 55  Pulse  74 68     Physical Exam  ***  LABS:  Last metabolic panel Lab Results  Component Value Date   GLUCOSE 102 (H) 03/14/2024   NA 143 03/14/2024   K 4.4 03/14/2024   CL 107 03/14/2024   CO2 26 03/14/2024   BUN 16 03/14/2024   CREATININE 0.90 03/14/2024   GFR 61.22 03/14/2024   CALCIUM  9.1 03/14/2024   PHOS 2.9 01/04/2018   PROT 6.2 (L) 06/02/2023   ALBUMIN 3.6 09/09/2023   BILITOT 0.8 06/02/2023   ALKPHOS 86 09/09/2023   AST 18 09/09/2023   ALT 11 09/09/2023   ANIONGAP 9 06/02/2023   Lab Results  Component Value Date   VITAMINB12 211 04/11/2024   Lab  Results  Component Value Date   TSH 0.08 (L) 04/11/2024   IMPRESSION AND PLAN:  No problem-specific Assessment & Plan notes found for this encounter.  Ultrasound-guided injection is preferred based on studies that show increased duration, increased effect, greater accuracy, decreased procedural pain, increased response rate, and decreased cost with ultrasound-guided versus blind injection. Procedure: Real-time ultrasound guided injection of left knee. Device: [employer] informed consent obtained.  Timeout conducted.  No overlying erythema, induration, or other signs of local infection. After sterile prep with Betadine, injected a mixture of 40 mg triamcinolone  and 3 mL of 1% lidocaine  without epi.  Injectate seen filling suprapatellar pouch. Patient tolerated the procedure well.  No immediate complications.  Post-injection care discussed. Advised to call if fever/chills, erythema, drainage, or persistent bleeding. Impression: Technically successful ultrasound-guided injection.  An After Visit Summary was printed and given to the patient.  FOLLOW UP: No follow-ups on file.  Signed:  Gerlene Hockey, MD           04/17/2024

## 2024-04-19 ENCOUNTER — Encounter: Payer: Self-pay | Admitting: Family Medicine

## 2024-04-19 ENCOUNTER — Ambulatory Visit: Admitting: Family Medicine

## 2024-04-19 NOTE — Progress Notes (Deleted)
 OFFICE VISIT  04/19/2024  CC: No chief complaint on file.   Patient is a 78 y.o. female who presents for left knee injection. I saw her in the office 8 days ago.  She has known left knee tricompartmental osteoarthritis. Her last steroid injection was 5 years ago. She wants to proceed with steroid injection today.  Past Medical History:  Diagnosis Date   Anemia    when I was on dyalysis   Anxiety    Arthritis    Cataract    Cholelithiasis 04/2021   noted on CT abd/pelv done for R lower abd swelling and MR abd   Chronic renal insufficiency, stage III (moderate) (HCC) 10/2015   Post-transplant baseline sCr 1.2-1.4.     CMV infection [facility]) summer 2017   Valcyte per ID/Renal transplant team   Depression    Diverticulosis    a. 05/2012 colonoscopy   Erosive lichen planus of vulva    Topical steroids (managed by Dr. Ricka [facility].   ESRD (end stage renal disease) (HCC)    began dialysis 2014--followed by Dr. Perri.  Received deceased donor kidney transplant 10/2015.   Fatty liver 10/2021   with hepatomegaly   FSGS (focal segmental glomerulosclerosis)    right kidney; hx of left renal cell cancer and got nephrectomy 1993.   GERD (gastroesophageal reflux disease)    Gout    Heart murmur    (Diastolic) ECHO 08/2013 showed that this murmur is coming from pt's R arm AV fistula   Hiatal hernia    History of renal cell cancer 1993   Left nephrectomy   Hyperlipidemia    Hypertension    Hypothyroidism    Impingement syndrome of left shoulder 04/2016   Dr. Marsa to PT   Inguinal hernia    right   Kidney cancer, primary, with metastasis from kidney to other site [facility])    Lumbar spinal stenosis    Chronic LBP with bilat neurogenic claudication   Metatarsal fracture, pathologic 01/2018   Right; orthocarolina-->post op shoe continued, f/u x-ray planned.   Obesity    Osteoarthritis of left knee 08/2017   severe, diffuse,  tricompartmental.  Responded to steroid injection.     Osteoporosis 06/07/2016   DEXA T-score of -3.1.  Fosamax  planned 2017 but dental work prevented start..  Pathological toe fx 12/2017--repeat DEXA 08/2018, T-score -3.3 radius. 12/2020 T score radius -3.7.   PAF (paroxysmal atrial fibrillation) (HCC)    dx 11/2022   Pneumonia    Renal transplant recipient 11/06/2015   Baseline Cr as of 09/2020 1.2-1.4   Simple hepatic cyst    2022   Solitary kidney, acquired    SVT (supraventricular tachycardia) (HCC)    UTI (urinary tract infection)    Ventral hernia    Vitamin B12 deficiency 12/2018   Starting replacement as of 12/11/2018    Past Surgical History:  Procedure Laterality Date   AV FISTULA PLACEMENT Right    Right arm: aneurismal dilatation 2017 being followed by CV surgeons   CARDIOVASCULAR STRESS TEST     03/10/15 ETT ([facility]): Exercise ECG negative at 83% max predicted HR.    CATARACT EXTRACTION     COLONOSCOPY  2003; 05/2012   2013 diverticulosis, no polyps.  03/10/23 ->one adenoma and extensive diverticulosis as well as internal hemorrhoids   colonoscopy with polypectomy     Dr Abran   DEXA  06/07/2016; 08/22/2018; 12/30/20   2017 T-score -3.1.  2020 T  score -3.3.  2022 T score -3.7   HYSTERECTOMY ABDOMINAL WITH SALPINGECTOMY     KIDNEY TRANSPLANT  11/13/15   Deceased donor kidney transplant, with Thymoglobulin induction   LIGATION OF ARTERIOVENOUS  FISTULA Right 02/14/2017   Procedure: LIGATION OF ARTERIOVENOUS  FISTULA;  Surgeon: Eliza Lonni RAMAN, MD;  Location: [facility] OR;  Service: Vascular;  Laterality: Right;   NEPHRECTOMY  1993   for malignancy- left   RENAL BIOPSY     right   RESECTION OF ARTERIOVENOUS FISTULA ANEURYSM Right 02/14/2017   Procedure: EXCISION OF RIGHT BRACHIOCEPHALIC ARTERIOVENOUS FISTULA ANEURYSM;  Surgeon: Eliza Lonni RAMAN, MD;  Location: [facility] OR;  Service: Vascular;  Laterality: Right;   TEE WITHOUT CARDIOVERSION N/A 08/27/2013   Procedure:  TRANSESOPHAGEAL ECHOCARDIOGRAM (TEE);  Surgeon: Maude JAYSON Emmer, MD;  Location: [facility] ENDOSCOPY;  Service: Cardiovascular;  Laterality: N/A;   TOTAL ABDOMINAL HYSTERECTOMY W/ BILATERAL SALPINGOOPHORECTOMY  1997   fibroids   TRANSTHORACIC ECHOCARDIOGRAM     03/10/15 echo ([facility]): LV cavity normal in size, focal basal hypertrophy, normal systolic function, EF 55% (visual est). Normal wall motion, no regional wall motion abnormalities. Mild diastolic dysfunction with normal LA chamber size. No significant valve stenosis or regurgitation.  12/2022 normal   VULVA / PERINEUM BIOPSY  2015    Outpatient Medications Prior to Visit  Medication Sig Dispense Refill   acetaminophen  (TYLENOL ) 500 MG tablet Take 1,000 mg by mouth every 8 (eight) hours as needed for moderate pain.     apixaban  (ELIQUIS ) 5 MG TABS tablet Take 1 tablet (5 mg total) by mouth 2 (two) times daily. 180 tablet 3   AYR SALINE NASAL DROPS NA Place 2 sprays into both nostrils daily.     Camphor-Eucalyptus-Menthol (VICKS VAPORUB EX) Apply 1 application topically daily.     Cyanocobalamin  (B-12 PO) Take 1 tablet by mouth daily.     doxazosin  (CARDURA ) 2 MG tablet Take 1 tablet (2 mg total) by mouth 2 (two) times daily. 180 tablet 1   levothyroxine  (SYNTHROID ) 125 MCG tablet TAKE 1 AND A ONE-HALF TABLET BY MOUTH ON MONDAY AND WEDNESDAY AND FRIDAY. TAKE 1 BY MOUTH ALL OTHER DAYS 112 tablet 0   LORazepam  (ATIVAN ) 0.5 MG tablet Take 1 tablet (0.5 mg total) by mouth 2 (two) times daily as needed for anxiety. 30 tablet 1   losartan  (COZAAR ) 100 MG tablet Take 1 tablet (100 mg total) by mouth daily. 90 tablet 1   magnesium  chloride (SLOW-MAG) 64 MG TBEC SR tablet Take 2 tablets (128 mg total) by mouth daily. 180 tablet 1   mycophenolate  (MYFORTIC ) 360 MG TBEC EC tablet Take 360 mg by mouth 2 (two) times daily.     neomycin -polymyxin-hydrocortisone  (CORTISPORIN) OTIC solution 2 drops in affected ear twice a day as needed for  itching 10 mL 1   nystatin  powder Apply 1 Application topically once a week.     silver  sulfADIAZINE  (SILVADENE ) 1 % cream Apply 1 Application topically daily.     sodium bicarbonate  650 MG tablet Take 1 tablet (650 mg total) by mouth 2 (two) times daily. Takes 1 in AM and 1 at bedtime 180 tablet 1   tacrolimus  (PROGRAF ) 1 MG capsule Take 2 mg by mouth 2 (two) times daily.     triamcinolone  cream (KENALOG ) 0.1 % Apply 1 Application topically 2 (two) times daily. 45 g 0   verapamil  (CALAN -SR) 120 MG CR tablet TAKE 1 TABLET BY MOUTH EVERY  AFTERNOON (Patient not taking: Reported  on 04/11/2024) 90 tablet 1   verapamil  (CALAN -SR) 240 MG CR tablet Take 1 tablet (240 mg total) by mouth every morning. 90 tablet 1   Vitamin D , Ergocalciferol , (DRISDOL ) 1.25 MG (50000 UNIT) CAPS capsule 1 cap po two times per week 24 capsule 1   No facility-administered medications prior to visit.    Allergies  Allergen Reactions   Labetalol     Patient couldn't walk, low BP   Ceclor [Cefaclor] Rash   Naproxen Rash   Enalapril Maleate Cough    Vasotec   Metoprolol Tartrate Rash    On legs    Review of Systems  As per HPI  PE:    04/11/2024   10:21 AM 04/11/2024    9:51 AM 03/14/2024    9:28 AM  Vitals with BMI  Height   5' 5.75  Weight  235 lbs 13 oz 233 lbs 13 oz  BMI  38.35 38.03  Systolic 124 157 866  Diastolic 76 61 55  Pulse  74 68     Physical Exam  ***  LABS:  Last metabolic panel Lab Results  Component Value Date   GLUCOSE 102 (H) 03/14/2024   NA 143 03/14/2024   K 4.4 03/14/2024   CL 107 03/14/2024   CO2 26 03/14/2024   BUN 16 03/14/2024   CREATININE 0.90 03/14/2024   GFR 61.22 03/14/2024   CALCIUM  9.1 03/14/2024   PHOS 2.9 01/04/2018   PROT 6.2 (L) 06/02/2023   ALBUMIN 3.6 09/09/2023   BILITOT 0.8 06/02/2023   ALKPHOS 86 09/09/2023   AST 18 09/09/2023   ALT 11 09/09/2023   ANIONGAP 9 06/02/2023   Lab Results  Component Value Date   VITAMINB12 211 04/11/2024   Lab  Results  Component Value Date   TSH 0.08 (L) 04/11/2024   IMPRESSION AND PLAN:  No problem-specific Assessment & Plan notes found for this encounter.  Ultrasound-guided injection is preferred based on studies that show increased duration, increased effect, greater accuracy, decreased procedural pain, increased response rate, and decreased cost with ultrasound-guided versus blind injection. Procedure: Real-time ultrasound guided injection of left knee. Device: [employer] informed consent obtained.  Timeout conducted.  No overlying erythema, induration, or other signs of local infection. After sterile prep with Betadine, injected a mixture of 40 mg triamcinolone  and 3 mL of 1% lidocaine  without epi.  Injectate seen filling suprapatellar pouch. Patient tolerated the procedure well.  No immediate complications.  Post-injection care discussed. Advised to call if fever/chills, erythema, drainage, or persistent bleeding. Impression: Technically successful ultrasound-guided injection.  An After Visit Summary was printed and given to the patient.  FOLLOW UP: No follow-ups on file.  Signed:  Gerlene Hockey, MD           04/19/2024

## 2024-04-24 DIAGNOSIS — B259 Cytomegaloviral disease, unspecified: Secondary | ICD-10-CM | POA: Diagnosis not present

## 2024-04-24 DIAGNOSIS — Z940 Kidney transplant status: Secondary | ICD-10-CM | POA: Diagnosis not present

## 2024-04-24 DIAGNOSIS — I4891 Unspecified atrial fibrillation: Secondary | ICD-10-CM | POA: Diagnosis not present

## 2024-04-24 DIAGNOSIS — N051 Unspecified nephritic syndrome with focal and segmental glomerular lesions: Secondary | ICD-10-CM | POA: Diagnosis not present

## 2024-04-24 DIAGNOSIS — K469 Unspecified abdominal hernia without obstruction or gangrene: Secondary | ICD-10-CM | POA: Diagnosis not present

## 2024-04-24 DIAGNOSIS — I10 Essential (primary) hypertension: Secondary | ICD-10-CM | POA: Diagnosis not present

## 2024-04-24 DIAGNOSIS — N2581 Secondary hyperparathyroidism of renal origin: Secondary | ICD-10-CM | POA: Diagnosis not present

## 2024-04-24 DIAGNOSIS — D849 Immunodeficiency, unspecified: Secondary | ICD-10-CM | POA: Diagnosis not present

## 2024-05-06 ENCOUNTER — Other Ambulatory Visit: Payer: Self-pay

## 2024-05-06 MED ORDER — LEVOTHYROXINE SODIUM 125 MCG PO TABS: ORAL_TABLET | ORAL | 0 refills | Status: DC

## 2024-05-16 DIAGNOSIS — H04123 Dry eye syndrome of bilateral lacrimal glands: Secondary | ICD-10-CM | POA: Diagnosis not present

## 2024-05-16 DIAGNOSIS — H353131 Nonexudative age-related macular degeneration, bilateral, early dry stage: Secondary | ICD-10-CM | POA: Diagnosis not present

## 2024-05-16 DIAGNOSIS — H527 Unspecified disorder of refraction: Secondary | ICD-10-CM | POA: Diagnosis not present

## 2024-05-16 DIAGNOSIS — H26493 Other secondary cataract, bilateral: Secondary | ICD-10-CM | POA: Diagnosis not present

## 2024-05-16 DIAGNOSIS — Z961 Presence of intraocular lens: Secondary | ICD-10-CM | POA: Diagnosis not present

## 2024-05-16 DIAGNOSIS — H52203 Unspecified astigmatism, bilateral: Secondary | ICD-10-CM | POA: Diagnosis not present

## 2024-05-23 ENCOUNTER — Encounter: Payer: Self-pay | Admitting: Emergency Medicine

## 2024-05-23 ENCOUNTER — Ambulatory Visit: Admission: EM | Admit: 2024-05-23 | Discharge: 2024-05-23 | Disposition: A | Discharge status: Home / Self Care

## 2024-05-23 ENCOUNTER — Encounter: Payer: Self-pay | Admitting: Family Medicine

## 2024-05-23 DIAGNOSIS — S61412A Laceration without foreign body of left hand, initial encounter: Secondary | ICD-10-CM | POA: Diagnosis not present

## 2024-05-23 NOTE — ED Triage Notes (Signed)
 Patient states that she cut her left 2 fingers (index and middle) today on a metal door.  Last Tdap 2023.

## 2024-05-23 NOTE — Discharge Instructions (Signed)
 Keep area clean and reasonably dry You can soak the glue off in about 5 days Call or return for any redness, drainage, or signs of infection

## 2024-05-23 NOTE — ED Provider Notes (Addendum)
 [facility] C[facility]    CSN: [id number] Arrival date & time: 05/23/24  1748      History   Chief Complaint Chief Complaint  Patient presents with   Laceration    HPI Tabitha Green is a 78 y.o. female.   HPI Patient states that she was out sightseeing with her friends looking at the fall scenery.  She slipped on the leaves and hit her hand on a metal gate.  She has 2 small lacerations.  She is here for evaluation. Tetanus is up-to-date Bleeding controlled with pressure.  Is on Eliquis . Renal transplant patient  Past Medical History:  Diagnosis Date   Anemia    when I was on dyalysis   Anxiety    Arthritis    Cataract    Cholelithiasis 04/2021   noted on CT abd/pelv done for R lower abd swelling and MR abd   Chronic renal insufficiency, stage III (moderate) 10/2015   Post-transplant baseline sCr 1.2-1.4.     CMV infection [facility]) summer 2017   Valcyte per ID/Renal transplant team   Depression    Diverticulosis    a. 05/2012 colonoscopy   Erosive lichen planus of vulva    Topical steroids (managed by Dr. Ricka KIDD [facility] via [facility] outpt [facility].   ESRD (end stage renal disease) (HCC)    began dialysis 2014--followed by Dr. Perri.  Received deceased donor kidney transplant 10/2015.   Fatty liver 10/2021   with hepatomegaly   FSGS (focal segmental glomerulosclerosis)    right kidney; hx of left renal cell cancer and got nephrectomy 1993.   GERD (gastroesophageal reflux disease)    Gout    Heart murmur    (Diastolic) ECHO 08/2013 showed that this murmur is coming from pt's R arm AV fistula   Hiatal hernia    History of renal cell cancer 1993   Left nephrectomy   Hyperlipidemia    Hypertension    Hypothyroidism    Impingement syndrome of left shoulder 04/2016   Dr. Marsa to PT   Inguinal hernia    right   Kidney cancer, primary, with metastasis from kidney to other site [facility])    Lumbar spinal stenosis    Chronic LBP with  bilat neurogenic claudication   Metatarsal fracture, pathologic 01/2018   Right; orthocarolina-->post op shoe continued, f/u x-ray planned.   Obesity    Osteoarthritis of left knee 08/2017   severe, diffuse, tricompartmental.  Responded to steroid injection.     Osteoporosis 06/07/2016   DEXA T-score of -3.1.  Fosamax  planned 2017 but dental work prevented start..  Pathological toe fx 12/2017--repeat DEXA 08/2018, T-score -3.3 radius. 12/2020 T score radius -3.7.   PAF (paroxysmal atrial fibrillation) (HCC)    dx 11/2022   Pneumonia    Renal transplant recipient 11/06/2015   Baseline Cr as of 09/2020 1.2-1.4   Simple hepatic cyst    2022   Solitary kidney, acquired    SVT (supraventricular tachycardia)    UTI (urinary tract infection)    Ventral hernia    Vitamin B12 deficiency 12/2018   Starting replacement as of 12/11/2018    Patient Active Problem List   Diagnosis Date Noted   Rectal bleeding 06/21/2023   Diverticulosis of colon with hemorrhage 06/06/2023   ABLA (acute blood loss anemia) 06/04/2023   Lower GI bleed 06/02/2023   Persistent atrial fibrillation (HCC) 12/29/2022   PAF (paroxysmal atrial fibrillation) (HCC) 12/01/2022   Hypercoagulable state due to persistent atrial fibrillation (  HCC) 12/01/2022   Combined forms of age-related cataract of right eye 10/20/2020   COVID-19 08/06/2020   Lumbar spinal stenosis 02/05/2019   Cherry angioma 01/14/2018   Lentigines 01/14/2018   Multiple benign nevi 01/14/2018   Pain in left knee 08/28/2017   Seborrheic keratosis 11/23/2016   Left shoulder pain 05/02/2016   Wheeze 09/16/2015   Cough 09/16/2015   Acute bronchitis 09/16/2015   History of renal carcinoma 05/14/2015   Vulvar ulceration 06/22/2014   End stage renal disease (HCC) 04/09/2014   Mechanical complication of other vascular device, implant, and graft 04/09/2014   Cervical spondylosis 10/10/2013   Aortic insufficiency 08/20/2013   Hyperglycemia 12/14/2012   SVT  (supraventricular tachycardia) 12/13/2012   Uremia 12/13/2012   Chronic kidney disease (CKD), stage IV (severe) (HCC) 12/07/2012   Bradycardia 12/07/2012   VITAMIN D  DEFICIENCY 09/08/2009   CHRON GLOMERULONEPHRIT W/LES MEMBRANOUS GLN 09/08/2009   FATIGUE 09/08/2009   DIVERTICULOSIS, COLON 09/26/2008   OSTEOPENIA 09/26/2008   History of colonic polyps 09/26/2008   Hypothyroidism 10/02/2007   OTHER AND UNSPECIFIED HYPERLIPIDEMIA 10/02/2007   BENIGN POSITIONAL VERTIGO 10/02/2007   Unspecified essential hypertension 10/02/2007   Gout, unspecified 03/26/2007   DISORDER, DYSMETABOLIC SYNDROME X 03/26/2007   HX, PERSONAL, MALIGNANCY, KIDNEY 03/26/2007   OSTEOARTHRITIS 12/26/2006    Past Surgical History:  Procedure Laterality Date   AV FISTULA PLACEMENT Right    Right arm: aneurismal dilatation 2017 being followed by CV surgeons   CARDIOVASCULAR STRESS TEST     03/10/15 ETT ([facility]): Exercise ECG negative at 83% max predicted HR.    CATARACT EXTRACTION     COLONOSCOPY  2003; 05/2012   2013 diverticulosis, no polyps.  03/10/23 ->one adenoma and extensive diverticulosis as well as internal hemorrhoids   colonoscopy with polypectomy     Dr Abran   DEXA  06/07/2016; 08/22/2018; 12/30/20   2017 T-score -3.1.  2020 T score -3.3.  2022 T score -3.7   HYSTERECTOMY ABDOMINAL WITH SALPINGECTOMY     KIDNEY TRANSPLANT  11-18-2015   Deceased donor kidney transplant, with Thymoglobulin induction   LIGATION OF ARTERIOVENOUS  FISTULA Right 02/14/2017   Procedure: LIGATION OF ARTERIOVENOUS  FISTULA;  Surgeon: Eliza Lonni RAMAN, MD;  Location: [facility] OR;  Service: Vascular;  Laterality: Right;   NEPHRECTOMY  1993   for malignancy- left   RENAL BIOPSY     right   RESECTION OF ARTERIOVENOUS FISTULA ANEURYSM Right 02/14/2017   Procedure: EXCISION OF RIGHT BRACHIOCEPHALIC ARTERIOVENOUS FISTULA ANEURYSM;  Surgeon: Eliza Lonni RAMAN, MD;  Location: [facility] OR;  Service: Vascular;  Laterality: Right;    TEE WITHOUT CARDIOVERSION N/A 08/27/2013   Procedure: TRANSESOPHAGEAL ECHOCARDIOGRAM (TEE);  Surgeon: Maude JAYSON Emmer, MD;  Location: [facility] ENDOSCOPY;  Service: Cardiovascular;  Laterality: N/A;   TOTAL ABDOMINAL HYSTERECTOMY W/ BILATERAL SALPINGOOPHORECTOMY  1997   fibroids   TRANSTHORACIC ECHOCARDIOGRAM     03/10/15 echo ([facility]): LV cavity normal in size, focal basal hypertrophy, normal systolic function, EF 55% (visual est). Normal wall motion, no regional wall motion abnormalities. Mild diastolic dysfunction with normal LA chamber size. No significant valve stenosis or regurgitation.  12/2022 normal   VULVA / PERINEUM BIOPSY  2015    OB History     Gravida  2   Para  2   Term  2   Preterm  0   AB  0   Living  2      SAB  0   IAB  0   Ectopic  0   Multiple  0   Live Births               Home Medications    Prior to Admission medications   Medication Sig Start Date End Date Taking? Authorizing Provider  acetaminophen  (TYLENOL ) 500 MG tablet Take 1,000 mg by mouth every 8 (eight) hours as needed for moderate pain.   Yes [provider]  apixaban  (ELIQUIS ) 5 MG TABS tablet Take 1 tablet (5 mg total) by mouth 2 (two) times daily. 12/26/23  Yes McGowen, Aleene DEL, MD  AYR SALINE NASAL DROPS NA Place 2 sprays into both nostrils daily.   Yes [provider]  Camphor-Eucalyptus-Menthol (VICKS VAPORUB EX) Apply 1 application topically daily.   Yes [provider]  Cyanocobalamin  (B-12 PO) Take 1 tablet by mouth daily.   Yes [provider]  doxazosin  (CARDURA ) 2 MG tablet Take 1 tablet (2 mg total) by mouth 2 (two) times daily. 03/05/24  Yes McGowen, Aleene DEL, MD  levothyroxine  (SYNTHROID ) 125 MCG tablet TAKE 1 AND A ONE-HALF TABLET BY MOUTH ON MONDAY AND WEDNESDAY AND FRIDAY. TAKE 1 BY MOUTH ALL OTHER DAYS 05/06/24  Yes McGowen, Aleene DEL, MD  LORazepam  (ATIVAN ) 0.5 MG tablet Take 1 tablet (0.5 mg total) by mouth 2 (two)  times daily as needed for anxiety. 12/13/23  Yes McGowen, Aleene DEL, MD  losartan  (COZAAR ) 100 MG tablet Take 1 tablet (100 mg total) by mouth daily. 12/13/23  Yes McGowen, Aleene DEL, MD  magnesium  chloride (SLOW-MAG) 64 MG TBEC SR tablet Take 2 tablets (128 mg total) by mouth daily. 12/13/23  Yes McGowen, Aleene DEL, MD  mycophenolate  (MYFORTIC ) 360 MG TBEC EC tablet Take 360 mg by mouth 2 (two) times daily.   Yes [provider]  neomycin -polymyxin-hydrocortisone  (CORTISPORIN) OTIC solution 2 drops in affected ear twice a day as needed for itching 03/14/24  Yes McGowen, Aleene DEL, MD  nystatin  powder Apply 1 Application topically once a week.   Yes [provider]  silver  sulfADIAZINE  (SILVADENE ) 1 % cream Apply 1 Application topically daily.   Yes [provider]  sodium bicarbonate  650 MG tablet Take 1 tablet (650 mg total) by mouth 2 (two) times daily. Takes 1 in AM and 1 at bedtime 12/13/23  Yes McGowen, Aleene DEL, MD  tacrolimus  (PROGRAF ) 1 MG capsule Take 2 mg by mouth 2 (two) times daily.   Yes [provider]  triamcinolone  cream (KENALOG ) 0.1 % Apply 1 Application topically 2 (two) times daily. 10/17/23  Yes Kuneff, Renee A, DO  verapamil  (CALAN -SR) 120 MG CR tablet TAKE 1 TABLET BY MOUTH EVERY  AFTERNOON Patient taking differently: TAKE 1 TABLET BY MOUTH EVERY  AFTERNOON 12/13/23  Yes McGowen, Aleene DEL, MD  verapamil  (CALAN -SR) 240 MG CR tablet Take 1 tablet (240 mg total) by mouth every morning. 12/13/23  Yes McGowen, Aleene DEL, MD  Vitamin D , Ergocalciferol , (DRISDOL ) 1.25 MG (50000 UNIT) CAPS capsule 1 cap po two times per week 12/13/23  Yes McGowen, Aleene DEL, MD    Family History Family History  Problem Relation Age of Onset   Deep vein thrombosis Mother        post thyroid  surgery   Hypertension Mother    Heart attack Father 89       deceased   Hypertension Father    Cancer Sister        breast   Cancer Paternal Aunt  pancreatic   Diabetes Maternal  Grandfather    Stroke Paternal Grandmother        in 53s   Fibroids Daughter    Colon cancer Neg Hx    Esophageal cancer Neg Hx    Stomach cancer Neg Hx    Rectal cancer Neg Hx    Sleep apnea Neg Hx     Social History Social History   Tobacco Use   Smoking status: Never   Smokeless tobacco: Never  Vaping Use   Vaping status: Never Used  Substance Use Topics   Alcohol use: Yes    Comment: rarely   Drug use: No     Allergies   Labetalol, Ceclor [cefaclor], Naproxen, Enalapril maleate, and Metoprolol tartrate   Review of Systems Review of Systems   Physical Exam Triage Vital Signs ED Triage Vitals  Encounter Vitals Group     BP 05/23/24 1800 (!) 178/86     Girls Systolic BP Percentile --      Girls Diastolic BP Percentile --      Boys Systolic BP Percentile --      Boys Diastolic BP Percentile --      Pulse Rate 05/23/24 1800 67     Resp 05/23/24 1800 18     Temp 05/23/24 1800 98.4 F (36.9 C)     Temp Source 05/23/24 1800 Oral     SpO2 05/23/24 1800 95 %     Weight 05/23/24 1759 235 lb (106.6 kg)     Height 05/23/24 1759 5' 5 (1.651 m)     Head Circumference --      Peak Flow --      Pain Score 05/23/24 1759 0     Pain Loc --      Pain Education --      Exclude from Growth Chart --    No data found.  Updated Vital Signs BP (!) 178/86 (BP Location: Right Arm)   Pulse 67   Temp 98.4 F (36.9 C) (Oral)   Resp 18   Ht 5' 5 (1.651 m)   Wt 106.6 kg   SpO2 95%   BMI 39.11 kg/m    Physical Exam Constitutional:      General: She is not in acute distress.    Appearance: She is well-developed. She is obese.  HENT:     Head: Normocephalic and atraumatic.  Eyes:     Conjunctiva/sclera: Conjunctivae normal.     Pupils: Pupils are equal, round, and reactive to light.  Cardiovascular:     Rate and Rhythm: Normal rate.  Pulmonary:     Effort: Pulmonary effort is normal. No respiratory distress.  Abdominal:     General: There is no distension.      Palpations: Abdomen is soft.  Musculoskeletal:        General: Normal range of motion.     Cervical back: Normal range of motion.  Skin:    General: Skin is warm and dry.     Comments: On the dorsum of the left hand pressed the proximal phalanx of the fifth finger and long finger there are superficial lacerations that measure 1-1/2 and 2 cm.  They are cleansed, dried, and sealed with glue  Neurological:     Mental Status: She is alert.      UC Treatments / Results  Labs (all labs ordered are listed, but only abnormal results are displayed) Labs Reviewed - No data to display  EKG   Radiology No results found.  Procedures Procedures (including critical care time)  Medications Ordered in UC Medications - No data to display  Initial Impression / Assessment and Plan / UC Course  I have reviewed the triage vital signs and the nursing notes.  Pertinent labs & imaging results that were available during my care of the patient were reviewed by me and considered in my medical decision making (see chart for details).     Wound care discussed Final Clinical Impressions(s) / UC Diagnoses   Final diagnoses:  Laceration of left hand without foreign body, initial encounter     Discharge Instructions      Keep area clean and reasonably dry You can soak the glue off in about 5 days Call or return for any redness, drainage, or signs of infection   ED Prescriptions   None    PDMP not reviewed this encounter.   Maranda Jamee Jacob, MD 05/23/24 AUGUSTIN    Maranda Jamee Jacob, MD 05/23/24 RONOLD

## 2024-05-24 ENCOUNTER — Telehealth: Payer: Self-pay | Admitting: Emergency Medicine

## 2024-05-24 NOTE — Telephone Encounter (Signed)
LMTRC.  Advised if doing well to disregard the call.  Any questions or concerns feel free to contact the office. 

## 2024-06-14 ENCOUNTER — Encounter: Payer: Self-pay | Admitting: Family Medicine

## 2024-06-14 ENCOUNTER — Ambulatory Visit: Admitting: Family Medicine

## 2024-06-14 VITALS — BP 174/68 | HR 83 | Temp 98.3°F | Ht 65.0 in | Wt 240.0 lb

## 2024-06-14 DIAGNOSIS — N2889 Other specified disorders of kidney and ureter: Secondary | ICD-10-CM | POA: Diagnosis not present

## 2024-06-14 DIAGNOSIS — E538 Deficiency of other specified B group vitamins: Secondary | ICD-10-CM

## 2024-06-14 DIAGNOSIS — E039 Hypothyroidism, unspecified: Secondary | ICD-10-CM

## 2024-06-14 DIAGNOSIS — Z79899 Other long term (current) drug therapy: Secondary | ICD-10-CM

## 2024-06-14 DIAGNOSIS — I10 Essential (primary) hypertension: Secondary | ICD-10-CM | POA: Diagnosis not present

## 2024-06-14 DIAGNOSIS — F411 Generalized anxiety disorder: Secondary | ICD-10-CM

## 2024-06-14 DIAGNOSIS — Z23 Encounter for immunization: Secondary | ICD-10-CM

## 2024-06-14 LAB — BASIC METABOLIC PANEL WITH GFR
BUN: 18 mg/dL (ref 6–23)
CO2: 25 meq/L (ref 19–32)
Calcium: 9.6 mg/dL (ref 8.4–10.5)
Chloride: 106 meq/L (ref 96–112)
Creatinine, Ser: 1.01 mg/dL (ref 0.40–1.20)
GFR: 53.21 mL/min — ABNORMAL LOW (ref >60.00)
Glucose, Bld: 98 mg/dL (ref 70–99)
Potassium: 4.5 meq/L (ref 3.5–5.1)
Sodium: 142 meq/L (ref 135–145)

## 2024-06-14 LAB — TSH: TSH: 0.1 u[IU]/mL — ABNORMAL LOW (ref 0.35–5.50)

## 2024-06-14 LAB — VITAMIN B12: Vitamin B-12: 373 pg/mL (ref 211–911)

## 2024-06-14 MED ORDER — AZITHROMYCIN 250 MG PO TABS: ORAL_TABLET | ORAL | 0 refills | Status: DC

## 2024-06-14 MED ORDER — LORAZEPAM 0.5 MG PO TABS: 0.5000 mg | ORAL_TABLET | Freq: Two times a day (BID) | ORAL | 2 refills | Status: AC | PRN

## 2024-06-14 NOTE — Progress Notes (Signed)
 OFFICE VISIT  06/14/2024  CC: No chief complaint on file.   Patient is a 78 y.o. female who presents for 33-month follow-up chronic renal insufficiency, anxiety, hypertension, vitamin B12 deficiency, and hypothyroidism. A/P as of last visit: 1 chronic renal insufficiency stage III. History of renal transplant. 3 months ago her GFR was 60, stable. Continue current dosing of mycophenolate  and tacrolimus .  BMET today.   #2 chronic anxiety.  She uses lorazepam  pretty infrequently.   #3 hypertension, well-controlled on losartan  100 mg a day. BMET today.   #4 Anemia due to lower GI bleed 05/2023.   Suspected to be hemorrhoidal bleeding. She took oral iron supplement up until last visit, at which time her iron level and hemoglobin were normal.   #5 mild nonspecific dermatitis of the external auditory canals. Cortisporin otic drops prescription done today, 2 drops twice a day in affected ear as needed.   #6 acquired hypothyroidism. Well-controlled on 125 mcg tabs of levothyroxine , 1-1/2 tab daily alternating with 1 tab daily. TSH today.  #7 Chronic left knee pain.  She has tricompartmental osteoarthritis.  Steroid injections helped back in 2019. She will consider return for steroid injection at her earliest convenience. She does not want to proceed with specialist referral at this time.  INTERIM HX: B12 was low 2 months ago, started sublingual B12. TSH a little low 2 months ago: Changed levothyroxine  to 1 and 1/2 of the 125 mcg tab 4 days a week and 1 whole tab on the other 3 days/week.  Doing okay overall.  Chronic fatigue, anxiety, and poor motivation stable. The last couple of months she has had persistent congestion in the sinuses, postnasal drip that is thick, some sinus headaches.  No fever.  No upper teeth pain.  No cough or wheeze or shortness of breath   PMP AWARE reviewed today: most recent rx for lorazepam  was filled 12/13/2023, # 30, rx by me. No red flags.  ROS as  above, plus--> no CP,  no dizziness,no rashes, no melena/hematochezia.  No polyuria or polydipsia.  No focal weakness, paresthesias, or tremors.  No acute vision or hearing abnormalities.  No dysuria or unusual/new urinary urgency or frequency.  No recent changes in lower legs. No n/v/d or abd pain.  No palpitations.    Past Medical History:  Diagnosis Date   Anemia    when I was on dyalysis   Anxiety    Arthritis    Cataract    Cholelithiasis 04/2021   noted on CT abd/pelv done for R lower abd swelling and MR abd   Chronic renal insufficiency, stage III (moderate) 10/2015   Post-transplant baseline sCr 1.2-1.4.     CMV infection [facility]) summer 2017   Valcyte per ID/Renal transplant team   Depression    Diverticulosis    a. 05/2012 colonoscopy   Erosive lichen planus of vulva    Topical steroids (managed by Dr. Ricka KIDD [facility].   ESRD (end stage renal disease) (HCC)    began dialysis 2014--followed by Dr. Perri.  Received deceased donor kidney transplant 10/2015.   Fatty liver 10/2021   with hepatomegaly   FSGS (focal segmental glomerulosclerosis)    right kidney; hx of left renal cell cancer and got nephrectomy 1993.   GERD (gastroesophageal reflux disease)    Gout    Heart murmur    (Diastolic) ECHO 08/2013 showed that this murmur is coming from pt's R arm AV fistula   Hiatal hernia  History of renal cell cancer 1993   Left nephrectomy   Hyperlipidemia    Hypertension    Hypothyroidism    Impingement syndrome of left shoulder 04/2016   Dr. Marsa to PT   Inguinal hernia    right   Kidney cancer, primary, with metastasis from kidney to other site [facility])    Lumbar spinal stenosis    Chronic LBP with bilat neurogenic claudication   Metatarsal fracture, pathologic 01/2018   Right; orthocarolina-->post op shoe continued, f/u x-ray planned.   Obesity    Osteoarthritis of left knee 08/2017   severe, diffuse,  tricompartmental.  Responded to steroid injection.     Osteoporosis 06/07/2016   DEXA T-score of -3.1.  Fosamax  planned 2017 but dental work prevented start..  Pathological toe fx 12/2017--repeat DEXA 08/2018, T-score -3.3 radius. 12/2020 T score radius -3.7.   PAF (paroxysmal atrial fibrillation) (HCC)    dx 11/2022   Pneumonia    Renal transplant recipient 2015/11/24   Baseline Cr as of 09/2020 1.2-1.4   Simple hepatic cyst    2022   Solitary kidney, acquired    SVT (supraventricular tachycardia)    UTI (urinary tract infection)    Ventral hernia    Vitamin B12 deficiency 12/2018   Starting replacement as of 12/11/2018    Past Surgical History:  Procedure Laterality Date   AV FISTULA PLACEMENT Right    Right arm: aneurismal dilatation 2017 being followed by CV surgeons   CARDIOVASCULAR STRESS TEST     03/10/15 ETT ([facility]): Exercise ECG negative at 83% max predicted HR.    CATARACT EXTRACTION     COLONOSCOPY  2003; 05/2012   2013 diverticulosis, no polyps.  03/10/23 ->one adenoma and extensive diverticulosis as well as internal hemorrhoids   colonoscopy with polypectomy     Dr Abran   DEXA  06/07/2016; 08/22/2018; 12/30/20   2017 T-score -3.1.  2020 T score -3.3.  2022 T score -3.7   HYSTERECTOMY ABDOMINAL WITH SALPINGECTOMY     KIDNEY TRANSPLANT  Nov 24, 2015   Deceased donor kidney transplant, with Thymoglobulin induction   LIGATION OF ARTERIOVENOUS  FISTULA Right 02/14/2017   Procedure: LIGATION OF ARTERIOVENOUS  FISTULA;  Surgeon: Eliza Lonni RAMAN, MD;  Location[facility] OR;  Service: Vascular;  Laterality: Right;   NEPHRECTOMY  1993   for malignancy- left   RENAL BIOPSY     right   RESECTION OF ARTERIOVENOUS FISTULA ANEURYSM Right 02/14/2017   Procedure: EXCISION OF RIGHT BRACHIOCEPHALIC ARTERIOVENOUS FISTULA ANEURYSM;  Surgeon: Eliza Lonni RAMAN, MD;  Location: [facility] OR;  Service: Vascular;  Laterality: Right;   TEE WITHOUT CARDIOVERSION N/A 08/27/2013   Procedure:  TRANSESOPHAGEAL ECHOCARDIOGRAM (TEE);  Surgeon: Maude JAYSON Emmer, MD;  Location: [facility] ENDOSCOPY;  Service: Cardiovascular;  Laterality: N/A;   TOTAL ABDOMINAL HYSTERECTOMY W/ BILATERAL SALPINGOOPHORECTOMY  1997   fibroids   TRANSTHORACIC ECHOCARDIOGRAM     03/10/15 echo ([facility]): LV cavity normal in size, focal basal hypertrophy, normal systolic function, EF 55% (visual est). Normal wall motion, no regional wall motion abnormalities. Mild diastolic dysfunction with normal LA chamber size. No significant valve stenosis or regurgitation.  12/2022 normal   VULVA / PERINEUM BIOPSY  2015    Outpatient Medications Prior to Visit  Medication Sig Dispense Refill   acetaminophen  (TYLENOL ) 500 MG tablet Take 1,000 mg by mouth every 8 (eight) hours as needed for moderate pain.     apixaban  (ELIQUIS ) 5 MG TABS tablet Take 1 tablet (5  mg total) by mouth 2 (two) times daily. 180 tablet 3   AYR SALINE NASAL DROPS NA Place 2 sprays into both nostrils daily.     Camphor-Eucalyptus-Menthol (VICKS VAPORUB EX) Apply 1 application topically daily.     Cyanocobalamin  (B-12 PO) Take 1 tablet by mouth daily.     doxazosin  (CARDURA ) 2 MG tablet Take 1 tablet (2 mg total) by mouth 2 (two) times daily. 180 tablet 1   levothyroxine  (SYNTHROID ) 125 MCG tablet TAKE 1 AND A ONE-HALF TABLET BY MOUTH ON MONDAY AND WEDNESDAY AND FRIDAY. TAKE 1 BY MOUTH ALL OTHER DAYS 112 tablet 0   losartan  (COZAAR ) 100 MG tablet Take 1 tablet (100 mg total) by mouth daily. 90 tablet 1   magnesium  chloride (SLOW-MAG) 64 MG TBEC SR tablet Take 2 tablets (128 mg total) by mouth daily. 180 tablet 1   mycophenolate  (MYFORTIC ) 360 MG TBEC EC tablet Take 360 mg by mouth 2 (two) times daily.     neomycin -polymyxin-hydrocortisone  (CORTISPORIN) OTIC solution 2 drops in affected ear twice a day as needed for itching 10 mL 1   nystatin  powder Apply 1 Application topically once a week.     silver  sulfADIAZINE  (SILVADENE ) 1 % cream Apply 1  Application topically daily.     sodium bicarbonate  650 MG tablet Take 1 tablet (650 mg total) by mouth 2 (two) times daily. Takes 1 in AM and 1 at bedtime 180 tablet 1   tacrolimus  (PROGRAF ) 1 MG capsule Take 2 mg by mouth 2 (two) times daily.     triamcinolone  cream (KENALOG ) 0.1 % Apply 1 Application topically 2 (two) times daily. 45 g 0   verapamil  (CALAN -SR) 120 MG CR tablet TAKE 1 TABLET BY MOUTH EVERY  AFTERNOON (Patient taking differently: TAKE 1 TABLET BY MOUTH EVERY  AFTERNOON) 90 tablet 1   verapamil  (CALAN -SR) 240 MG CR tablet Take 1 tablet (240 mg total) by mouth every morning. 90 tablet 1   Vitamin D , Ergocalciferol , (DRISDOL ) 1.25 MG (50000 UNIT) CAPS capsule 1 cap po two times per week 24 capsule 1   LORazepam  (ATIVAN ) 0.5 MG tablet Take 1 tablet (0.5 mg total) by mouth 2 (two) times daily as needed for anxiety. 30 tablet 1   No facility-administered medications prior to visit.    Allergies  Allergen Reactions   Labetalol     Patient couldn't walk, low BP   Ceclor [Cefaclor] Rash   Naproxen Rash   Enalapril Maleate Cough    Vasotec   Metoprolol Tartrate Rash    On legs    Review of Systems As per HPI  PE:    06/14/2024    9:38 AM 06/14/2024    9:29 AM 05/23/2024    6:36 PM  Vitals with BMI  Height  5' 5   Weight  240 lbs   BMI  39.94   Systolic 174 147 842  Diastolic 68 81 93  Pulse  83      Physical Exam  VS: noted--normal. Gen: alert, NAD, NONTOXIC APPEARING. HEENT: eyes without injection, drainage, or swelling.  Ears: EACs clear, TMs with normal light reflex and landmarks.  Nose: Clear rhinorrhea, with some dried, crusty exudate adherent to mildly injected mucosa.  No purulent d/c.  No paranasal sinus TTP.  No facial swelling.  Throat and mouth without focal lesion.  No pharyngial swelling, erythema, or exudate.   Neck: supple, no LAD.   LUNGS: CTA bilat, nonlabored resps.   CV: Regular with some premature beats about  every 10th beat.  Rate 85.  No  prolonged pauses.  no m/r/g. EXT: no c/c/e SKIN: no rash   LABS:  Last CBC Lab Results  Component Value Date   WBC 6.3 02/15/2024   HGB 13.7 02/15/2024   HCT 42.1 02/15/2024   MCV 89.2 02/15/2024   MCH 29.3 06/08/2023   RDW 16.9 (H) 02/15/2024   PLT 222.0 02/15/2024   Lab Results  Component Value Date   IRON 170 (H) 02/15/2024   TIBC 340 02/15/2024   FERRITIN 20 02/15/2024   Last metabolic panel Lab Results  Component Value Date   GLUCOSE 102 (H) 03/14/2024   NA 143 03/14/2024   K 4.4 03/14/2024   CL 107 03/14/2024   CO2 26 03/14/2024   BUN 16 03/14/2024   CREATININE 0.90 03/14/2024   GFR 61.22 03/14/2024   CALCIUM  9.1 03/14/2024   PHOS 2.9 01/04/2018   PROT 6.2 (L) 06/02/2023   ALBUMIN 3.6 09/09/2023   BILITOT 0.8 06/02/2023   ALKPHOS 86 09/09/2023   AST 18 09/09/2023   ALT 11 09/09/2023   ANIONGAP 9 06/02/2023   Last lipids Lab Results  Component Value Date   CHOL 130 09/15/2023   HDL 42.20 09/15/2023   LDLCALC 62 09/15/2023   LDLDIRECT 166.3 10/02/2007   TRIG 131.0 09/15/2023   CHOLHDL 3 09/15/2023   Last hemoglobin A1c Lab Results  Component Value Date   HGBA1C 5.4 02/23/2012   Last thyroid  functions Lab Results  Component Value Date   TSH 0.08 (L) 04/11/2024   FREET4 1.80 (H) 09/12/2023   Last vitamin D  Lab Results  Component Value Date   VD25OH 37.90 09/15/2023   Last vitamin B12 and Folate Lab Results  Component Value Date   VITAMINB12 211 04/11/2024   IMPRESSION AND PLAN:  #1 hypertension, elevated blood pressure in the office today. Continue losartan  100 mg a day. She will restart home blood pressure monitoring and return or send MyChart message if it is consistently greater than 140/90. Basic metabolic panel today.  #2 chronic renal insufficiency stage III. History of renal transplant. 3 months ago her GFR was 60, stable. Continue current dosing of mycophenolate  and tacrolimus .  BMET today. Continue scheduled nephrology  follow-ups.  #3 chronic anxiety.  She uses lorazepam  pretty infrequently.  Refilled today 0.5 mg tabs, #30, refill x 2.  4.  Hypothyroidism, TSH a little low 2 months ago: Changed levothyroxine  to 1 and 1/2 of the 125 mcg tab 4 days a week and 1 whole tab on the other 3 days/week.  Check TSH today.  5.  Vitamin B12 deficiency.  We got her back on 1000 mcg B12 sublingual about 2 months ago. Recheck level today.  #6 acute sinusitis. Continue saline nasal spray.  Z-Pak prescribed today.  An After Visit Summary was printed and given to the patient.  FOLLOW UP: Return in about 3 months (around 09/14/2024) for annual CPE (fasting).  Signed:  Gerlene Hockey, MD           06/14/2024

## 2024-06-16 ENCOUNTER — Ambulatory Visit: Payer: Self-pay | Admitting: Family Medicine

## 2024-06-16 DIAGNOSIS — E039 Hypothyroidism, unspecified: Secondary | ICD-10-CM

## 2024-06-18 ENCOUNTER — Encounter: Payer: Self-pay | Admitting: Family Medicine

## 2024-06-18 NOTE — Telephone Encounter (Signed)
 No further action needed at this time.

## 2024-06-18 NOTE — Telephone Encounter (Signed)
 My fault. It looks like I ordered her TSH to be repeated in 6 weeks but I forgot to send a result message with the thyroid  dosing instructions! Take one of the 125mcg tabs 5d/week and 1 and 1/2 tabs on 2d/week.  Not sure what the question about UTI is. There is no indication in my note that we talked about her urine.  There is no urine testing done that day. I did prescribe azithromycin  for a sinus infection that day. See if she can clarify this with her.

## 2024-07-10 ENCOUNTER — Other Ambulatory Visit: Payer: Self-pay

## 2024-07-10 MED ORDER — VERAPAMIL HCL ER 240 MG PO TBCR: 240.0000 mg | EXTENDED_RELEASE_TABLET | Freq: Every morning | ORAL | 1 refills | Status: AC

## 2024-07-10 MED ORDER — VERAPAMIL HCL ER 120 MG PO TBCR: EXTENDED_RELEASE_TABLET | ORAL | 1 refills | Status: AC

## 2024-07-14 ENCOUNTER — Other Ambulatory Visit: Payer: Self-pay | Admitting: Family Medicine

## 2024-07-25 ENCOUNTER — Other Ambulatory Visit: Payer: Self-pay | Admitting: Family Medicine

## 2024-07-26 ENCOUNTER — Encounter: Payer: Self-pay | Admitting: Family Medicine

## 2024-07-26 ENCOUNTER — Ambulatory Visit: Admitting: Family Medicine

## 2024-07-26 VITALS — BP 153/89 | HR 77 | Temp 97.7°F | Ht 65.0 in | Wt 243.8 lb

## 2024-07-26 DIAGNOSIS — R600 Localized edema: Secondary | ICD-10-CM

## 2024-07-26 DIAGNOSIS — L304 Erythema intertrigo: Secondary | ICD-10-CM | POA: Diagnosis not present

## 2024-07-26 DIAGNOSIS — M7989 Other specified soft tissue disorders: Secondary | ICD-10-CM

## 2024-07-26 MED ORDER — CLOTRIMAZOLE-BETAMETHASONE 1-0.05 % EX CREA: TOPICAL_CREAM | CUTANEOUS | 0 refills | Status: AC

## 2024-07-26 NOTE — Progress Notes (Signed)
 OFFICE VISIT  07/26/2024  CC:  Chief Complaint  Patient presents with   Ankle Swelling    Right, onset Thanksgiving   Patient is a 78 y.o. female who presents for swelling in the ankle.  HPI: Onset about a month ago.  She notes the right lower leg/ankle swelling but possibly the left as well, just not as prominent.  She denies pain or injury.  She drove to [place] and not long after that drove to [facility].  Sounds like her swelling got worse after this. She says she does not eat a high sodium diet.  Left armpit with rash lately, itchy.  She has had similar intertrigo rash in abdominal pannus region. She says she is currently applying triamcinolone  0.1% cream twice a day.  Review of systems: No shortness of breath, no chest pain, no calf pain, no rash, no fever, no malaise, no swelling of the hands or face. No focal weakness.  Past Medical History:  Diagnosis Date   Anemia    when I was on dyalysis   Anxiety    Arthritis    Cataract    Cholelithiasis 04/2021   noted on CT abd/pelv done for R lower abd swelling and MR abd   Chronic renal insufficiency, stage III (moderate) 10/2015   Post-transplant baseline sCr 1.2-1.4.     CMV infection [facility]) summer 2017   Valcyte per ID/Renal transplant team   Depression    Diverticulosis    a. 05/2012 colonoscopy   Erosive lichen planus of vulva    Topical steroids (managed by Dr. Ricka K[facility]D [facility].   ESRD (end stage renal disease) (HCC)    began dialysis 2014--followed by Dr. Perri.  Received deceased donor kidney transplant 10/2015.   Fatty liver 10/2021   with hepatomegaly   FSGS (focal segmental glomerulosclerosis)    right kidney; hx of left renal cell cancer and got nephrectomy 1993.   GERD (gastroesophageal reflux disease)    Gout    Heart murmur    (Diastolic) ECHO 08/2013 showed that this murmur is coming from pt's R arm AV fistula   Hiatal hernia    History of renal  cell cancer 1993   Left nephrectomy   Hyperlipidemia    Hypertension    Hypothyroidism    Impingement syndrome of left shoulder 04/2016   Dr. Marsa to PT   Inguinal hernia    right   Kidney cancer, primary, with metastasis from kidney to other site [facility])    Lumbar spinal stenosis    Chronic LBP with bilat neurogenic claudication   Metatarsal fracture, pathologic 01/2018   Right; orthocarolina-->post op shoe continued, f/u x-ray planned.   Obesity    Osteoarthritis of left knee 08/2017   severe, diffuse, tricompartmental.  Responded to steroid injection.     Osteoporosis 06/07/2016   DEXA T-score of -3.1.  Fosamax  planned 2017 but dental work prevented start..  Pathological toe fx 12/2017--repeat DEXA 08/2018, T-score -3.3 radius. 12/2020 T score radius -3.7.   PAF (paroxysmal atrial fibrillation) (HCC)    dx 11/2022   Pneumonia    Renal transplant recipient 11/06/2015   Baseline Cr as of 09/2020 1.2-1.4   Simple hepatic cyst    2022   Solitary kidney, acquired    SVT (supraventricular tachycardia)    UTI (urinary tract infection)    Ventral hernia    Vitamin B12 deficiency 12/2018   Starting replacement as of 12/11/2018    Past Surgical  History:  Procedure Laterality Date   AV FISTULA PLACEMENT Right    Right arm: aneurismal dilatation 2017 being followed by CV surgeons   CARDIOVASCULAR STRESS TEST     03/10/15 ETT ([facility]): Exercise ECG negative at 83% max predicted HR.    CATARACT EXTRACTION     COLONOSCOPY  2003; 05/2012   2013 diverticulosis, no polyps.  03/10/23 ->one adenoma and extensive diverticulosis as well as internal hemorrhoids   colonoscopy with polypectomy     Dr Abran   DEXA  06/07/2016; 08/22/2018; 12/30/20   2017 T-score -3.1.  2020 T score -3.3.  2022 T score -3.7   HYSTERECTOMY ABDOMINAL WITH SALPINGECTOMY     KIDNEY TRANSPLANT  2015/12/03   Deceased donor kidney transplant, with Thymoglobulin induction   LIGATION OF ARTERIOVENOUS  FISTULA  Right 02/14/2017   Procedure: LIGATION OF ARTERIOVENOUS  FISTULA;  Surgeon: Eliza Lonni RAMAN, MD;  Location: [facility] OR;  Service: Vascular;  Laterality: Right;   NEPHRECTOMY  1993   for malignancy- left   RENAL BIOPSY     right   RESECTION OF ARTERIOVENOUS FISTULA ANEURYSM Right 02/14/2017   Procedure: EXCISION OF RIGHT BRACHIOCEPHALIC ARTERIOVENOUS FISTULA ANEURYSM;  Surgeon: Eliza Lonni RAMAN, MD;  Location: [facility] OR;  Service: Vascular;  Laterality: Right;   TEE WITHOUT CARDIOVERSION N/A 08/27/2013   Procedure: TRANSESOPHAGEAL ECHOCARDIOGRAM (TEE);  Surgeon: Maude JAYSON Emmer, MD;  Location: [facility] ENDOSCOPY;  Service: Cardiovascular;  Laterality: N/A;   TOTAL ABDOMINAL HYSTERECTOMY W/ BILATERAL SALPINGOOPHORECTOMY  1997   fibroids   TRANSTHORACIC ECHOCARDIOGRAM     03/10/15 echo ([facility]): LV cavity normal in size, focal basal hypertrophy, normal systolic function, EF 55% (visual est). Normal wall motion, no regional wall motion abnormalities. Mild diastolic dysfunction with normal LA chamber size. No significant valve stenosis or regurgitation.  12/2022 normal   VULVA / PERINEUM BIOPSY  2015    Outpatient Medications Prior to Visit  Medication Sig Dispense Refill   acetaminophen  (TYLENOL ) 500 MG tablet Take 1,000 mg by mouth every 8 (eight) hours as needed for moderate pain.     apixaban  (ELIQUIS ) 5 MG TABS tablet Take 1 tablet (5 mg total) by mouth 2 (two) times daily. 180 tablet 3   AYR SALINE NASAL DROPS NA Place 2 sprays into both nostrils daily.     Camphor-Eucalyptus-Menthol (VICKS VAPORUB EX) Apply 1 application topically daily.     Cyanocobalamin  (B-12 PO) Take 1 tablet by mouth daily.     doxazosin  (CARDURA ) 2 MG tablet Take 1 tablet (2 mg total) by mouth 2 (two) times daily. 180 tablet 1   levothyroxine  (SYNTHROID ) 125 MCG tablet TAKE 1 AND 1/2 TABLETS BY MOUTH MONDAY, WEDNESDAY, FRIDAY. TAKE 1 TABLET BY MOUTH ALL OTHER DAYS 112 tablet 0   LORazepam  (ATIVAN )  0.5 MG tablet Take 1 tablet (0.5 mg total) by mouth 2 (two) times daily as needed for anxiety. 30 tablet 2   losartan  (COZAAR ) 100 MG tablet Take 1 tablet (100 mg total) by mouth daily. 90 tablet 1   magnesium  chloride (SLOW-MAG) 64 MG TBEC SR tablet Take 2 tablets (128 mg total) by mouth daily. 180 tablet 1   mycophenolate  (MYFORTIC ) 360 MG TBEC EC tablet Take 360 mg by mouth 2 (two) times daily.     neomycin -polymyxin-hydrocortisone  (CORTISPORIN) OTIC solution 2 drops in affected ear twice a day as needed for itching 10 mL 1   nystatin  powder Apply 1 Application topically once a week.  silver  sulfADIAZINE  (SILVADENE ) 1 % cream Apply 1 Application topically daily.     sodium bicarbonate  650 MG tablet Take 1 tablet (650 mg total) by mouth 2 (two) times daily. Takes 1 in AM and 1 at bedtime 180 tablet 1   tacrolimus  (PROGRAF ) 1 MG capsule Take 2 mg by mouth 2 (two) times daily.     triamcinolone  cream (KENALOG ) 0.1 % Apply 1 Application topically 2 (two) times daily. 45 g 0   verapamil  (CALAN -SR) 120 MG CR tablet TAKE 1 TABLET BY MOUTH EVERY  AFTERNOON 90 tablet 1   verapamil  (CALAN -SR) 240 MG CR tablet Take 1 tablet (240 mg total) by mouth every morning. 90 tablet 1   Vitamin D , Ergocalciferol , (DRISDOL ) 1.25 MG (50000 UNIT) CAPS capsule TAKE 1 CAPSULE BY MOUTH 2 TIMES EVERY WEEK 24 capsule 1   azithromycin  (ZITHROMAX ) 250 MG tablet 2 tabs po qd x 1d, then 1 tab po qd x 4d (Patient not taking: Reported on 07/26/2024) 6 tablet 0   No facility-administered medications prior to visit.    Allergies[1]  Review of Systems  As per HPI  PE:    07/26/2024    1:27 PM 06/14/2024    9:38 AM 06/14/2024    9:29 AM  Vitals with BMI  Height 5' 5  5' 5  Weight 243 lbs 13 oz  240 lbs  BMI 40.57  39.94  Systolic 153 174 852  Diastolic 89 68 81  Pulse 77  83     Physical Exam  1-2+ pitting in the pretibial region on the right leg, just a trace in the same region on the left leg.  The right  ankle has much more prominent pitting--->3-4+ on Right, 2+ on L. No calf, ankle, or foot tenderness or redness or warmth. Left axilla with pinkish purpleish macular rash with well-demarcated borders.  No maceration.  LABS:  Last CBC Lab Results  Component Value Date   WBC 6.3 02/15/2024   HGB 13.7 02/15/2024   HCT 42.1 02/15/2024   MCV 89.2 02/15/2024   MCH 29.3 06/08/2023   RDW 16.9 (H) 02/15/2024   PLT 222.0 02/15/2024   Last metabolic panel Lab Results  Component Value Date   GLUCOSE 98 06/14/2024   NA 142 06/14/2024   K 4.5 06/14/2024   CL 106 06/14/2024   CO2 25 06/14/2024   BUN 18 06/14/2024   CREATININE 1.01 06/14/2024   GFR 53.21 (L) 06/14/2024   CALCIUM  9.6 06/14/2024   PHOS 2.9 01/04/2018   PROT 6.2 (L) 06/02/2023   ALBUMIN 3.6 09/09/2023   BILITOT 0.8 06/02/2023   ALKPHOS 86 09/09/2023   AST 18 09/09/2023   ALT 11 09/09/2023   ANIONGAP 9 06/02/2023   Last hemoglobin A1c Lab Results  Component Value Date   HGBA1C 5.4 02/23/2012   Last thyroid  functions Lab Results  Component Value Date   TSH 0.10 (L) 06/14/2024   FREET4 1.80 (H) 09/12/2023   IMPRESSION AND PLAN:  #1 subacute bilateral ankle swelling, right significantly worse than left. Check D-dimer.  If positive then proceed with venous Doppler to rule out DVT.  #2 intertrigo of left axilla. Lotrisone  cream prescribed today, apply twice daily as needed.  If not responsive to this then we will try nystatin  cream. Stop triamcinolone .  An After Visit Summary was printed and given to the patient.  FOLLOW UP: Return if symptoms worsen or fail to improve.  Signed:  Phil Jemmie Ledgerwood, MD  07/26/2024      [1]  Allergies Allergen Reactions   Labetalol     Patient couldn't walk, low BP   Ceclor [Cefaclor] Rash   Naproxen Rash   Enalapril Maleate Cough    Vasotec   Metoprolol Tartrate Rash    On legs

## 2024-07-27 ENCOUNTER — Other Ambulatory Visit: Payer: Self-pay | Admitting: Family Medicine

## 2024-07-27 ENCOUNTER — Encounter: Payer: Self-pay | Admitting: Family Medicine

## 2024-07-27 LAB — D-DIMER, QUANTITATIVE: D-Dimer, Quant: 0.89 ug{FEU}/mL — ABNORMAL HIGH

## 2024-07-29 ENCOUNTER — Encounter: Payer: Self-pay | Admitting: Family Medicine

## 2024-07-29 NOTE — Telephone Encounter (Signed)
 She went to the emergency department. She sent another MyChart message after she went to the emergency department. I responded to this today.  Can you make sure she got my message?

## 2024-07-29 NOTE — Telephone Encounter (Signed)
 No further action needed at this time.

## 2024-07-29 NOTE — Telephone Encounter (Signed)
 Pt reviewed other message sent by PCP.

## 2024-07-30 ENCOUNTER — Encounter: Payer: Self-pay | Admitting: Family Medicine

## 2024-07-30 ENCOUNTER — Ambulatory Visit: Payer: Self-pay | Admitting: Family Medicine

## 2024-08-09 ENCOUNTER — Encounter (HOSPITAL_COMMUNITY): Payer: Self-pay

## 2024-08-19 ENCOUNTER — Encounter: Payer: Self-pay | Admitting: Family Medicine

## 2024-08-19 ENCOUNTER — Ambulatory Visit: Payer: PRIVATE HEALTH INSURANCE | Admitting: Family Medicine

## 2024-08-19 VITALS — BP 156/78 | HR 68 | Temp 97.2°F | Wt 240.6 lb

## 2024-08-19 DIAGNOSIS — K2900 Acute gastritis without bleeding: Secondary | ICD-10-CM | POA: Diagnosis not present

## 2024-08-19 MED ORDER — PANTOPRAZOLE SODIUM 40 MG PO TBEC: 40.0000 mg | DELAYED_RELEASE_TABLET | Freq: Every day | ORAL | 1 refills | Status: AC

## 2024-08-19 NOTE — Progress Notes (Signed)
 OFFICE VISIT  08/19/2024  CC:  Chief Complaint  Patient presents with   Breast Pain    Pain under the right breast 2-3 weeks     Patient is a 79 y.o. female who presents for pain under rightbreast.  HPI: 89 female being seen today for pain in upper abdomen region.  Onset about 3 weeks ago.  Constant.  No change brought on by eating versus empty stomach.  Bowel habits unchanged-> no constipation, diarrhea, blood in stool, black stool, or pus in stool. No nausea, no retrosternal burning.  No chest pain.  No shortness of breath. No fever. She does not take NSAIDs.  Past Medical History:  Diagnosis Date   Anemia    when I was on dyalysis   Anxiety and depression    Cholelithiasis 04/2021   noted on CT abd/pelv done for R lower abd swelling and MR abd   Chronic renal insufficiency, stage III (moderate) 10/2015   Post-transplant baseline sCr 1.2-1.4.     CMV infection [facility]) summer 2017   Valcyte per ID/Renal transplant team   Diverticulosis    a. 05/2012 colonoscopy   Erosive lichen planus of vulva    Topical steroids (managed by Dr. Ricka KIDD [facility] via [facility] outpt services.   Fatty liver 10/2021   with hepatomegaly   FSGS (focal segmental glomerulosclerosis)    right kidney; hx of left renal cell cancer and got nephrectomy 1993.   GERD (gastroesophageal reflux disease)    Gout    Heart murmur    (Diastolic) ECHO 08/2013 showed that this murmur is coming from pt's R arm AV fistula   Hiatal hernia    History of renal cell cancer 1993   Left nephrectomy   Hyperlipidemia    Hypertension    Hypothyroidism    Impingement syndrome of left shoulder 04/2016   Dr. Marsa to PT   Inguinal hernia    right   Kidney cancer, primary, with metastasis from kidney to other site [facility])    Lumbar spinal stenosis    Chronic LBP with bilat neurogenic claudication   Metatarsal fracture, pathologic 01/2018   Right; orthocarolina-->post op shoe continued,  f/u x-ray planned.   Obesity    Osteoarthritis of left knee 08/2017   severe, diffuse, tricompartmental.  Responded to steroid injection.     Osteoarthritis of right foot    Osteoporosis 06/07/2016   DEXA T-score of -3.1.  Fosamax  planned 2017 but dental work prevented start..  Pathological toe fx 12/2017--repeat DEXA 08/2018, T-score -3.3 radius. 12/2020 T score radius -3.7.   PAF (paroxysmal atrial fibrillation) (HCC)    dx 11/2022   Renal transplant recipient 11/06/2015   Baseline Cr as of 09/2020 1.2-1.4   Simple hepatic cyst    2022   Solitary kidney, acquired    SVT (supraventricular tachycardia)    Ventral hernia    Vitamin B12 deficiency 12/2018   Starting replacement as of 12/11/2018    Past Surgical History:  Procedure Laterality Date   AV FISTULA PLACEMENT Right    Right arm: aneurismal dilatation 2017 being followed by CV surgeons   CARDIOVASCULAR STRESS TEST     03/10/15 ETT ([facility]): Exercise ECG negative at 83% max predicted HR.    CATARACT EXTRACTION     COLONOSCOPY  2003; 05/2012   2013 diverticulosis, no polyps.  03/10/23 ->one adenoma and extensive diverticulosis as well as internal hemorrhoids   colonoscopy with polypectomy     Dr Abran  DEXA  06/07/2016; 08/22/2018; 12/30/20   2017 T-score -3.1.  2020 T score -3.3.  2022 T score -3.7   HYSTERECTOMY ABDOMINAL WITH SALPINGECTOMY     KIDNEY TRANSPLANT  11/12/15   Deceased donor kidney transplant, with Thymoglobulin induction   LIGATION OF ARTERIOVENOUS  FISTULA Right 02/14/2017   Procedure: LIGATION OF ARTERIOVENOUS  FISTULA;  Surgeon: Eliza Lonni RAMAN, MD;  Location: [facility] OR;  Service: Vascular;  Laterality: Right;   NEPHRECTOMY  1993   for malignancy- left   RENAL BIOPSY     right   RESECTION OF ARTERIOVENOUS FISTULA ANEURYSM Right 02/14/2017   Procedure: EXCISION OF RIGHT BRACHIOCEPHALIC ARTERIOVENOUS FISTULA ANEURYSM;  Surgeon: Eliza Lonni RAMAN, MD;  Location: [facility] OR;  Service: Vascular;   Laterality: Right;   TEE WITHOUT CARDIOVERSION N/A 08/27/2013   Procedure: TRANSESOPHAGEAL ECHOCARDIOGRAM (TEE);  Surgeon: Maude JAYSON Emmer, MD;  Location: [facility] ENDOSCOPY;  Service: Cardiovascular;  Laterality: N/A;   TOTAL ABDOMINAL HYSTERECTOMY W/ BILATERAL SALPINGOOPHORECTOMY  1997   fibroids   TRANSTHORACIC ECHOCARDIOGRAM     03/10/15 echo ([facility]): LV cavity normal in size, focal basal hypertrophy, normal systolic function, EF 55% (visual est). Normal wall motion, no regional wall motion abnormalities. Mild diastolic dysfunction with normal LA chamber size. No significant valve stenosis or regurgitation.  12/2022 normal   VULVA / PERINEUM BIOPSY  2015    Outpatient Medications Prior to Visit  Medication Sig Dispense Refill   acetaminophen  (TYLENOL ) 500 MG tablet Take 1,000 mg by mouth every 8 (eight) hours as needed for moderate pain.     apixaban  (ELIQUIS ) 5 MG TABS tablet Take 1 tablet (5 mg total) by mouth 2 (two) times daily. 180 tablet 3   AYR SALINE NASAL DROPS NA Place 2 sprays into both nostrils daily.     Camphor-Eucalyptus-Menthol (VICKS VAPORUB EX) Apply 1 application topically daily.     clotrimazole -betamethasone  (LOTRISONE ) cream Apply to underarm rash twice a day as needed 30 g 0   Cyanocobalamin  (B-12 PO) Take 1 tablet by mouth daily.     doxazosin  (CARDURA ) 2 MG tablet Take 1 tablet (2 mg total) by mouth 2 (two) times daily. 180 tablet 1   levothyroxine  (SYNTHROID ) 125 MCG tablet TAKE 1 AND 1/2 TABLETS BY MOUTH MONDAY, WEDNESDAY, FRIDAY. TAKE 1 TABLET BY MOUTH ALL OTHER DAYS 112 tablet 0   LORazepam  (ATIVAN ) 0.5 MG tablet Take 1 tablet (0.5 mg total) by mouth 2 (two) times daily as needed for anxiety. 30 tablet 2   losartan  (COZAAR ) 100 MG tablet TAKE 1 TABLET(100 MG) BY MOUTH DAILY 90 tablet 1   magnesium  chloride (SLOW-MAG) 64 MG TBEC SR tablet Take 2 tablets (128 mg total) by mouth daily. 180 tablet 1   mycophenolate  (MYFORTIC ) 360 MG TBEC EC tablet  Take 360 mg by mouth 2 (two) times daily.     neomycin -polymyxin-hydrocortisone  (CORTISPORIN) OTIC solution 2 drops in affected ear twice a day as needed for itching 10 mL 1   nystatin  powder Apply 1 Application topically once a week.     silver  sulfADIAZINE  (SILVADENE ) 1 % cream Apply 1 Application topically daily.     sodium bicarbonate  650 MG tablet Take 1 tablet (650 mg total) by mouth 2 (two) times daily. Takes 1 in AM and 1 at bedtime 180 tablet 1   tacrolimus  (PROGRAF ) 1 MG capsule Take 2 mg by mouth 2 (two) times daily.     triamcinolone  cream (KENALOG ) 0.1 % Apply 1 Application topically 2 (two)  times daily. 45 g 0   verapamil  (CALAN -SR) 120 MG CR tablet TAKE 1 TABLET BY MOUTH EVERY  AFTERNOON 90 tablet 1   verapamil  (CALAN -SR) 240 MG CR tablet Take 1 tablet (240 mg total) by mouth every morning. 90 tablet 1   Vitamin D , Ergocalciferol , (DRISDOL ) 1.25 MG (50000 UNIT) CAPS capsule TAKE 1 CAPSULE BY MOUTH 2 TIMES EVERY WEEK 24 capsule 1   No facility-administered medications prior to visit.    Allergies[1]  Review of Systems  As per HPI  PE:    08/19/2024   10:58 AM 08/19/2024   10:56 AM 07/26/2024    1:27 PM  Vitals with BMI  Height   5' 5  Weight  240 lbs 10 oz 243 lbs 13 oz  BMI  40.04 40.57  Systolic 156 160 846  Diastolic 78 90 89  Pulse  68 77    Physical Exam Exam chaperoned by Cloe Motsinger, CMA  Gen: Alert, well appearing.  Patient is oriented to person, place, time, and situation. AFFECT: pleasant, lucid thought and speech. ABD: soft, nondistended.  Mild TTP in mid epigastric>RUQ regions.  No guarding or rebound. BS normal.  No organomegaly. No rash.  LABS:  Last metabolic panel Lab Results  Component Value Date   GLUCOSE 98 06/14/2024   NA 142 06/14/2024   K 4.5 06/14/2024   CL 106 06/14/2024   CO2 25 06/14/2024   BUN 18 06/14/2024   CREATININE 1.01 06/14/2024   GFR 53.21 (L) 06/14/2024   CALCIUM  9.6 06/14/2024   PHOS 2.9 01/04/2018   PROT 6.2  (L) 06/02/2023   ALBUMIN 3.6 09/09/2023   BILITOT 0.8 06/02/2023   ALKPHOS 86 09/09/2023   AST 18 09/09/2023   ALT 11 09/09/2023   ANIONGAP 9 06/02/2023   IMPRESSION AND PLAN:  Dyspepsia/gastritis. Start pantoprazole  40 mg a day and will reevaluate at her follow-up appointment that is already set for early February. Signs/symptoms to call or return for were reviewed and pt expressed understanding.  An After Visit Summary was printed and given to the patient.  FOLLOW UP: Return for keep appt set for next month.  Signed:  Gerlene Hockey, MD           08/19/2024     [1]  Allergies Allergen Reactions   Labetalol     Patient couldn't walk, low BP   Ceclor [Cefaclor] Rash   Naproxen Rash   Enalapril Maleate Cough    Vasotec   Metoprolol Tartrate Rash    On legs

## 2024-08-20 ENCOUNTER — Ambulatory Visit: Payer: PRIVATE HEALTH INSURANCE | Admitting: Family Medicine

## 2024-09-01 ENCOUNTER — Other Ambulatory Visit: Payer: Self-pay | Admitting: Family Medicine

## 2024-09-04 ENCOUNTER — Encounter: Payer: Self-pay | Admitting: Family Medicine

## 2024-09-04 NOTE — Telephone Encounter (Signed)
 No further action needed at this time.

## 2024-09-05 ENCOUNTER — Encounter: Payer: Self-pay | Admitting: Family Medicine

## 2024-09-05 MED ORDER — DOXAZOSIN MESYLATE 2 MG PO TABS: 2.0000 mg | ORAL_TABLET | Freq: Two times a day (BID) | ORAL | 0 refills | Status: AC

## 2024-09-18 ENCOUNTER — Ambulatory Visit: Payer: Medicare Other
